# Patient Record
Sex: Female | Born: 1945 | Race: White | Hispanic: No | Smoking: Former smoker
Health system: Southern US, Community
[De-identification: ages and names within clinical notes are randomized; demographics above are authoritative.]

## PROBLEM LIST (undated history)

## (undated) DIAGNOSIS — J432 Centrilobular emphysema: Secondary | ICD-10-CM

## (undated) DIAGNOSIS — F32A Depression, unspecified: Secondary | ICD-10-CM

## (undated) DIAGNOSIS — M549 Dorsalgia, unspecified: Secondary | ICD-10-CM

## (undated) DIAGNOSIS — R0602 Shortness of breath: Secondary | ICD-10-CM

## (undated) DIAGNOSIS — N183 Chronic kidney disease, stage 3 unspecified: Secondary | ICD-10-CM

## (undated) DIAGNOSIS — S065X9A Traumatic subdural hemorrhage with loss of consciousness of unspecified duration, initial encounter: Secondary | ICD-10-CM

## (undated) DIAGNOSIS — I10 Essential (primary) hypertension: Secondary | ICD-10-CM

## (undated) DIAGNOSIS — C50919 Malignant neoplasm of unspecified site of unspecified female breast: Secondary | ICD-10-CM

## (undated) DIAGNOSIS — E785 Hyperlipidemia, unspecified: Secondary | ICD-10-CM

## (undated) DIAGNOSIS — K922 Gastrointestinal hemorrhage, unspecified: Secondary | ICD-10-CM

## (undated) DIAGNOSIS — G8929 Other chronic pain: Secondary | ICD-10-CM

## (undated) DIAGNOSIS — F329 Major depressive disorder, single episode, unspecified: Secondary | ICD-10-CM

## (undated) DIAGNOSIS — N179 Acute kidney failure, unspecified: Secondary | ICD-10-CM

## (undated) DIAGNOSIS — Z87891 Personal history of nicotine dependence: Secondary | ICD-10-CM

## (undated) DIAGNOSIS — I739 Peripheral vascular disease, unspecified: Secondary | ICD-10-CM

## (undated) DIAGNOSIS — S065XAA Traumatic subdural hemorrhage with loss of consciousness status unknown, initial encounter: Secondary | ICD-10-CM

## (undated) DIAGNOSIS — C189 Malignant neoplasm of colon, unspecified: Secondary | ICD-10-CM

## (undated) DIAGNOSIS — Z8679 Personal history of other diseases of the circulatory system: Secondary | ICD-10-CM

## (undated) HISTORY — PX: BACK SURGERY: SHX140

## (undated) HISTORY — DX: Personal history of other diseases of the circulatory system: Z86.79

## (undated) HISTORY — DX: Shortness of breath: R06.02

## (undated) HISTORY — PX: SHOULDER SURGERY: SHX246

## (undated) HISTORY — DX: Hyperlipidemia, unspecified: E78.5

## (undated) HISTORY — DX: Acute kidney failure, unspecified: N17.9

## (undated) HISTORY — DX: Malignant neoplasm of colon, unspecified: C18.9

## (undated) HISTORY — PX: PARTIAL HYSTERECTOMY: SHX80

## (undated) HISTORY — DX: Chronic kidney disease, stage 3 (moderate): N18.3

## (undated) HISTORY — PX: JOINT REPLACEMENT: SHX530

## (undated) HISTORY — PX: TUMOR REMOVAL: SHX12

## (undated) HISTORY — DX: Centrilobular emphysema: J43.2

## (undated) HISTORY — DX: Peripheral vascular disease, unspecified: I73.9

## (undated) HISTORY — PX: LUMBAR DISC SURGERY: SHX700

---
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
## 1998-06-14 ENCOUNTER — Encounter: Payer: Self-pay | Admitting: Neurosurgery

## 1998-06-14 ENCOUNTER — Ambulatory Visit (HOSPITAL_COMMUNITY): Admission: RE | Admit: 1998-06-14 | Discharge: 1998-06-14 | Payer: Self-pay | Admitting: Neurosurgery

## 1998-09-02 ENCOUNTER — Encounter: Payer: Self-pay | Admitting: Neurosurgery

## 1998-09-06 ENCOUNTER — Encounter: Payer: Self-pay | Admitting: Neurosurgery

## 1998-09-06 ENCOUNTER — Inpatient Hospital Stay (HOSPITAL_COMMUNITY): Admission: RE | Admit: 1998-09-06 | Discharge: 1998-09-12 | Payer: Self-pay | Admitting: Neurosurgery

## 1999-04-01 ENCOUNTER — Ambulatory Visit (HOSPITAL_COMMUNITY): Admission: RE | Admit: 1999-04-01 | Discharge: 1999-04-01 | Payer: Self-pay | Admitting: Neurosurgery

## 1999-04-01 ENCOUNTER — Encounter: Payer: Self-pay | Admitting: Neurosurgery

## 1999-08-22 ENCOUNTER — Encounter: Admission: RE | Admit: 1999-08-22 | Discharge: 1999-08-22 | Payer: Self-pay | Admitting: Neurosurgery

## 1999-08-22 ENCOUNTER — Encounter: Payer: Self-pay | Admitting: Neurosurgery

## 2002-07-29 ENCOUNTER — Encounter: Admission: RE | Admit: 2002-07-29 | Discharge: 2002-10-27 | Payer: Self-pay

## 2002-08-01 ENCOUNTER — Encounter: Payer: Self-pay | Admitting: Neurosurgery

## 2002-08-01 ENCOUNTER — Ambulatory Visit (HOSPITAL_COMMUNITY): Admission: RE | Admit: 2002-08-01 | Discharge: 2002-08-01 | Payer: Self-pay | Admitting: Neurosurgery

## 2002-10-16 ENCOUNTER — Ambulatory Visit (HOSPITAL_COMMUNITY): Admission: RE | Admit: 2002-10-16 | Discharge: 2002-10-16 | Payer: Self-pay | Admitting: Family Medicine

## 2002-10-16 ENCOUNTER — Encounter: Payer: Self-pay | Admitting: Family Medicine

## 2002-11-04 ENCOUNTER — Ambulatory Visit (HOSPITAL_COMMUNITY): Admission: RE | Admit: 2002-11-04 | Discharge: 2002-11-04 | Payer: Self-pay | Admitting: Family Medicine

## 2002-11-04 ENCOUNTER — Encounter: Payer: Self-pay | Admitting: Family Medicine

## 2002-12-25 ENCOUNTER — Ambulatory Visit (HOSPITAL_COMMUNITY): Admission: RE | Admit: 2002-12-25 | Discharge: 2002-12-25 | Payer: Self-pay | Admitting: Cardiology

## 2003-04-13 ENCOUNTER — Encounter
Admission: RE | Admit: 2003-04-13 | Discharge: 2003-07-12 | Payer: Self-pay | Admitting: Physical Medicine & Rehabilitation

## 2003-06-07 ENCOUNTER — Emergency Department (HOSPITAL_COMMUNITY): Admission: EM | Admit: 2003-06-07 | Discharge: 2003-06-07 | Payer: Self-pay | Admitting: Emergency Medicine

## 2003-12-23 ENCOUNTER — Ambulatory Visit (HOSPITAL_COMMUNITY): Admission: RE | Admit: 2003-12-23 | Discharge: 2003-12-23 | Payer: Self-pay | Admitting: Family Medicine

## 2004-02-06 ENCOUNTER — Emergency Department (HOSPITAL_COMMUNITY): Admission: EM | Admit: 2004-02-06 | Discharge: 2004-02-06 | Payer: Self-pay | Admitting: *Deleted

## 2005-02-08 ENCOUNTER — Ambulatory Visit (HOSPITAL_BASED_OUTPATIENT_CLINIC_OR_DEPARTMENT_OTHER): Admission: RE | Admit: 2005-02-08 | Discharge: 2005-02-08 | Payer: Self-pay | Admitting: Orthopedic Surgery

## 2005-02-08 ENCOUNTER — Ambulatory Visit (HOSPITAL_COMMUNITY): Admission: RE | Admit: 2005-02-08 | Discharge: 2005-02-08 | Payer: Self-pay | Admitting: Orthopedic Surgery

## 2005-02-20 ENCOUNTER — Encounter: Admission: RE | Admit: 2005-02-20 | Discharge: 2005-04-19 | Payer: Self-pay | Admitting: Orthopedic Surgery

## 2005-07-18 ENCOUNTER — Encounter: Admission: RE | Admit: 2005-07-18 | Discharge: 2005-07-18 | Payer: Self-pay | Admitting: Orthopedic Surgery

## 2006-02-28 ENCOUNTER — Ambulatory Visit (HOSPITAL_COMMUNITY): Admission: RE | Admit: 2006-02-28 | Discharge: 2006-02-28 | Payer: Self-pay | Admitting: Neurosurgery

## 2006-10-19 ENCOUNTER — Emergency Department (HOSPITAL_COMMUNITY): Admission: EM | Admit: 2006-10-19 | Discharge: 2006-10-19 | Payer: Self-pay | Admitting: Emergency Medicine

## 2006-11-01 ENCOUNTER — Inpatient Hospital Stay (HOSPITAL_COMMUNITY): Admission: RE | Admit: 2006-11-01 | Discharge: 2006-11-01 | Payer: Self-pay | Admitting: Neurosurgery

## 2007-04-30 ENCOUNTER — Ambulatory Visit: Payer: Self-pay | Admitting: Surgery

## 2007-04-30 ENCOUNTER — Encounter (INDEPENDENT_AMBULATORY_CARE_PROVIDER_SITE_OTHER): Payer: Self-pay | Admitting: Neurosurgery

## 2007-04-30 ENCOUNTER — Ambulatory Visit (HOSPITAL_COMMUNITY): Admission: RE | Admit: 2007-04-30 | Discharge: 2007-04-30 | Payer: Self-pay | Admitting: Neurosurgery

## 2007-04-30 ENCOUNTER — Encounter: Admission: RE | Admit: 2007-04-30 | Discharge: 2007-04-30 | Payer: Self-pay | Admitting: Neurosurgery

## 2007-11-29 ENCOUNTER — Encounter: Admission: RE | Admit: 2007-11-29 | Discharge: 2007-11-29 | Payer: Self-pay | Admitting: Neurosurgery

## 2008-01-09 ENCOUNTER — Encounter
Admission: RE | Admit: 2008-01-09 | Discharge: 2008-03-20 | Payer: Self-pay | Admitting: Physical Medicine & Rehabilitation

## 2008-01-10 ENCOUNTER — Ambulatory Visit: Payer: Self-pay | Admitting: Physical Medicine & Rehabilitation

## 2008-03-20 ENCOUNTER — Ambulatory Visit: Payer: Self-pay | Admitting: Physical Medicine & Rehabilitation

## 2008-04-09 ENCOUNTER — Ambulatory Visit (HOSPITAL_COMMUNITY): Admission: RE | Admit: 2008-04-09 | Discharge: 2008-04-12 | Payer: Self-pay | Admitting: Orthopedic Surgery

## 2008-04-14 ENCOUNTER — Ambulatory Visit: Payer: Self-pay | Admitting: Surgery

## 2008-04-14 ENCOUNTER — Ambulatory Visit: Admission: RE | Admit: 2008-04-14 | Discharge: 2008-04-14 | Payer: Self-pay | Admitting: Orthopedic Surgery

## 2008-04-14 ENCOUNTER — Encounter (INDEPENDENT_AMBULATORY_CARE_PROVIDER_SITE_OTHER): Payer: Self-pay | Admitting: Orthopedic Surgery

## 2008-05-27 ENCOUNTER — Encounter: Admission: RE | Admit: 2008-05-27 | Discharge: 2008-08-25 | Payer: Self-pay | Admitting: Orthopedic Surgery

## 2008-06-12 ENCOUNTER — Encounter
Admission: RE | Admit: 2008-06-12 | Discharge: 2008-09-10 | Payer: Self-pay | Admitting: Physical Medicine & Rehabilitation

## 2008-06-15 ENCOUNTER — Ambulatory Visit: Payer: Self-pay | Admitting: Physical Medicine & Rehabilitation

## 2008-07-10 ENCOUNTER — Encounter: Admission: RE | Admit: 2008-07-10 | Discharge: 2008-08-13 | Payer: Self-pay | Admitting: Anesthesiology

## 2008-07-14 ENCOUNTER — Ambulatory Visit: Payer: Self-pay | Admitting: Anesthesiology

## 2008-07-23 ENCOUNTER — Inpatient Hospital Stay (HOSPITAL_COMMUNITY): Admission: RE | Admit: 2008-07-23 | Discharge: 2008-07-27 | Payer: Self-pay | Admitting: Orthopedic Surgery

## 2008-08-25 ENCOUNTER — Encounter: Admission: RE | Admit: 2008-08-25 | Discharge: 2008-11-17 | Payer: Self-pay | Admitting: Orthopedic Surgery

## 2010-06-12 ENCOUNTER — Encounter: Payer: Self-pay | Admitting: Orthopedic Surgery

## 2010-09-01 LAB — COMPREHENSIVE METABOLIC PANEL
ALT: 13 U/L (ref 0–35)
Albumin: 3.9 g/dL (ref 3.5–5.2)
BUN: 20 mg/dL (ref 6–23)
Chloride: 100 mEq/L (ref 96–112)
Creatinine, Ser: 1.09 mg/dL (ref 0.4–1.2)
GFR calc non Af Amer: 51 mL/min — ABNORMAL LOW (ref >60)
Glucose, Bld: 102 mg/dL — ABNORMAL HIGH (ref 70–99)
Potassium: 3.6 mEq/L (ref 3.5–5.1)
Total Bilirubin: 0.8 mg/dL (ref 0.3–1.2)
Total Protein: 6.8 g/dL (ref 6.0–8.3)

## 2010-09-01 LAB — PROTIME-INR
INR: 1 (ref 0.00–1.49)
Prothrombin Time: 12.8 seconds (ref 11.6–15.2)

## 2010-09-01 LAB — URINALYSIS, ROUTINE W REFLEX MICROSCOPIC
Ketones, ur: NEGATIVE mg/dL
Leukocytes, UA: NEGATIVE
Nitrite: NEGATIVE
Protein, ur: NEGATIVE mg/dL
Specific Gravity, Urine: 1.009 (ref 1.005–1.030)
pH: 5.5 (ref 5.0–8.0)

## 2010-09-01 LAB — CBC
HCT: 41 % (ref 36.0–46.0)
Platelets: 247 10*3/uL (ref 150–400)

## 2010-09-01 LAB — URINE MICROSCOPIC-ADD ON

## 2010-09-07 ENCOUNTER — Other Ambulatory Visit: Payer: Self-pay

## 2010-09-08 ENCOUNTER — Other Ambulatory Visit: Payer: Self-pay | Admitting: Radiology

## 2010-09-08 DIAGNOSIS — C50911 Malignant neoplasm of unspecified site of right female breast: Secondary | ICD-10-CM

## 2010-09-09 DIAGNOSIS — C50919 Malignant neoplasm of unspecified site of unspecified female breast: Secondary | ICD-10-CM

## 2010-09-09 HISTORY — DX: Malignant neoplasm of unspecified site of unspecified female breast: C50.919

## 2010-09-10 ENCOUNTER — Ambulatory Visit
Admission: RE | Admit: 2010-09-10 | Discharge: 2010-09-10 | Disposition: A | Discharge status: Home / Self Care | Payer: 59 | Source: Ambulatory Visit | Attending: Radiology | Admitting: Radiology

## 2010-09-10 DIAGNOSIS — C50911 Malignant neoplasm of unspecified site of right female breast: Secondary | ICD-10-CM

## 2010-09-10 MED ORDER — GADOBENATE DIMEGLUMINE 529 MG/ML IV SOLN
5.0000 mL | Freq: Once | INTRAVENOUS | Status: AC | PRN
  Administered 2010-09-10: 5 mL via INTRAVENOUS

## 2010-09-12 ENCOUNTER — Other Ambulatory Visit: Payer: Self-pay

## 2010-09-14 ENCOUNTER — Other Ambulatory Visit: Payer: Self-pay | Admitting: Oncology

## 2010-09-14 ENCOUNTER — Encounter (HOSPITAL_BASED_OUTPATIENT_CLINIC_OR_DEPARTMENT_OTHER): Payer: 59 | Admitting: Oncology

## 2010-09-14 DIAGNOSIS — C50419 Malignant neoplasm of upper-outer quadrant of unspecified female breast: Secondary | ICD-10-CM

## 2010-09-14 LAB — CBC WITH DIFFERENTIAL/PLATELET
EOS%: 6.1 % (ref 0.0–7.0)
HCT: 33.7 % — ABNORMAL LOW (ref 34.8–46.6)
Platelets: 260 10*3/uL (ref 145–400)
RBC: 3.57 10*6/uL — ABNORMAL LOW (ref 3.70–5.45)

## 2010-09-14 LAB — COMPREHENSIVE METABOLIC PANEL
AST: 20 U/L (ref 0–37)
Alkaline Phosphatase: 59 U/L (ref 39–117)
BUN: 25 mg/dL — ABNORMAL HIGH (ref 6–23)
CO2: 29 mEq/L (ref 19–32)
Calcium: 9.5 mg/dL (ref 8.4–10.5)
Chloride: 106 mEq/L (ref 96–112)
Sodium: 141 mEq/L (ref 135–145)
Total Bilirubin: 0.3 mg/dL (ref 0.3–1.2)

## 2010-09-14 LAB — CANCER ANTIGEN 27.29: CA 27.29: 19 U/mL (ref 0–39)

## 2010-09-16 ENCOUNTER — Other Ambulatory Visit (HOSPITAL_COMMUNITY): Payer: Self-pay | Admitting: Surgery

## 2010-09-16 DIAGNOSIS — C50911 Malignant neoplasm of unspecified site of right female breast: Secondary | ICD-10-CM

## 2010-10-04 NOTE — Group Therapy Note (Signed)
REFERRAL:  Hosie Spangle, M.D.   PURPOSE OF EVALUATION:  Evaluate and treat for chronic low back pain.   HISTORY OF PRESENT ILLNESS:  [name] is a 65 year old Caucasian  female referred to this office by Dr. Sherwood Gambler, her neurosurgeon, for  evaluation and treatment of chronic diffuse lumbar pain.   Minimal medical records preceded the patient to this office and those  were reviewed prior to the office visit and then again with the patient  in the office today.   It appears that the patient has had low back pain since approximately  1998 at the time of her first surgery.  That was with Dr. Sherwood Gambler.  She  reports that she was prescribed OxyContin 20 mg b.i.d. at that time.  She reports that over the next 5-6 years OxyContin medication was  gradually increased to 40 mg q.12 h. along with immediate release  oxycodone on an as needed basis.  This was prescribed by Dr. Sherwood Gambler.   The patient reports a second surgery in the year 2000, which involved  stabilizing bars and screw placements, also performed by Dr. Sherwood Gambler.  The patient reports that despite the aggressive surgical treatment, she  has had persistent pain in her low back.  On February 26, 2007, the  patient underwent an MRI scan of her lumbar spine which showed  progression of facet disease at L1-2 with progressive biforaminal  stenosis.  There was significant increased prominence of disk bulge at  T11-T12.  She was status post posterior lumbar interbody fusion at L2-  L5.  There were prior diskectomies and fusion at L2-3 and L4-5, which  were stable.   The patient continued to be treated by Dr. Sherwood Gambler for her ongoing  pain.   On April 30, 2007, the patient underwent a followup MRI scan of her  lumbar spine which showed slightly increased diffuse disk protrusion at  T11-12 and L1-2 without focal nerve root impingement or cord  compression.  There was a new small broad-based disk protrusion at T12-  L1 without  impingement of significance.  There was stable fused levels  at L2-3 and L4-5 with no changes in facet joint disease at L5-S1.   The patient also reports that she underwent a CAT scan myelogram a few  months ago, but we do not have result of that study.   On May 10, 2007, the patient was seen by Dr. Sherwood Gambler and had  Doppler studies done to rule out deep vein thrombosis.  These were  negative.  Her OxyContin and oxycodone immediate release were refilled  at that time.   On November 08, 2007, the patient was seen by Dr. Sherwood Gambler and he noted  chronic narcotic use for 10 years since 1998.  She is on OxyContin CR at  40 mg b.i.d. and oxycodone immediate release 5 mg t.i.d. p.r.n.  She had  been on 20 mg as noted previously, but recent dose has been increased to  40 mg.  Dr. Sherwood Gambler did not feel comfortable with further adjustments  and referred her to this office for further pain management.  No  surgical intervention is planned at this time.   The patient reports that all pain medicines are not working as well  presently as they had been previously.  She reports taking OxyContin 40  mg and reports it relieved for only approximately 1-2 hours after the  dose.  She does take the oxycodone immediate release for breakthrough  pain and that generally is  about 3-4 per day.   The patient reports pain in her low back which she describes as aching  and occasionally burning.  She reports that she has received better  relief with her pain medicines in the past, but more recently has had  decreased relief.   PAST MEDICAL HISTORY:  1. Hypertension.  2. Chronic low back pain with lumbar surgery including cages in 1998      and lumbar surgery including stabilization bars and screws in 2000.  3. Dyslipidemia.  4. History of left carpal tunnel release, June 2008.   ALLERGIES:  No known drug allergies.   FAMILY HISTORY:  Positive for heart disease in her mother.  Her sister  has diabetes  mellitus, but she is unsure of any health problems related  to her father.   SOCIAL HISTORY:  The patient is single and lives alone.  She does not  work outside the home and is disabled.  She reports 1 alcoholic drink  per month and reports she smokes 1 pack of cigarettes per day.   MEDICATIONS:  1. Xanax 0.5 mg p.r.n.  2. BuSpar __________ daily.  3. Luvox 25 mg daily.  4. Avalide 300/12.5 mg 1 tablet daily.  5. Zocor 40 mg daily.  6. Lasix 20 mg daily.  7. Oxycodone immediate release 5 mg p.r.n.  8. OxyContin sustained release 40 mg q.12 h.  9. Premarin 0.3 mg daily.   REVIEW OF SYSTEMS:  Positive for weight gain, high blood sugar,  constipation, vomiting, nausea, and poor appetite.   PHYSICAL EXAMINATION:  GENERAL:  This is a well-appearing adult female  in mild-to-moderate acute discomfort.  VITAL SIGNS:  Blood pressure 122/64 with a pulse of 69, respiratory rate  18, and O2 saturation 96% on room air.  Height was 5 feet 4 inches and  weight 144 pounds.  MUSCULOSKELETAL:  Upper extremity range of motion was within normal  limits on the right with decreased range of motion of her left shoulder.  Strength was generally 4-/5 throughout the bilateral upper extremities,  especially proximally with 4+/5 strength distally.  Sensation was intact  to light touch throughout the bilateral upper extremities.  Lumbar range  of motion was decreased in extension and lateral bending.  She has a  well-healed lumbar scar along with a well-healed left shoulder scar.  The patient ambulates without any assistive device.  Straight leg raise  was negative to 30 degrees bilaterally.   IMPRESSION:  Post laminectomy syndrome/lumbar degenerative disk disease.   In the office today, we did discuss her pain medicines with her and  decided to increase her OxyContin to 60 mg q.12 h.  This will be an  increase from a total of 80 to 120 mg daily.  We also increased her  oxycodone to 15 mg tablet to be  used 1 tablet t.i.d. p.r.n. only on an  as needed basis.  She understands that all pain medicines need to come  through this office and that she needs to comply with all of our  regulations.  We did obtain a urine drug screen from the patient in the  office today.  She understands that all pain medicines need to come only  through this office and that she needs to call in for refills  approximately 5 business days before she runs out of the medication.  We  will  plan on seeing the patient in followup in approximately 2 months' time  to make adjustments as necessary at that  point.           ______________________________  Jarvis Morgan, M.D.     DC/MedQ  D:  01/13/2008 10:53:33  T:  01/14/2008 00:47:08  Job #:  [id number]

## 2010-10-04 NOTE — Op Note (Signed)
NAME:  [name], [name]             ACCOUNT NO.:  [id number]   MEDICAL RECORD NO.:  [id number]          PATIENT TYPE:  OIB   LOCATION:  5003                         FACILITY:  [facility]   PHYSICIAN:  Metta Clines. Supple, M.D.  DATE OF BIRTH:  [birth year]   DATE OF PROCEDURE:  07/23/2008  DATE OF DISCHARGE:                               OPERATIVE REPORT   PREOPERATIVE DIAGNOSIS:  End-stage right shoulder osteoarthrosis.   POSTOPERATIVE DIAGNOSIS:  End-stage right shoulder osteoarthrosis.   PROCEDURE:  Right total shoulder arthroplasty utilizing a size 12 Press-  Fit DePuy global stem and a cemented 40-mm glenoid with a 44 x 21  eccentric humeral head.   SURGEON:  Metta Clines. Supple, MD   ASSISTANT:  Reather Laurence. Shuford, PA-C   ANESTHESIA:  General endotracheal as well as a interscalene block.   ESTIMATED BLOOD LOSS:  300 mL.   DRAINS:  None.   HISTORY:  [name] is a 65 year old female who has had chronic pain  related to bilateral shoulder end-stage arthrosis as well as a chronic  degenerative lumbar condition.  She has recently undergone a left total  shoulder arthroplasty and has done well functionally.  She now presents  with severe right shoulder pain secondary to end-stage arthrosis and is  brought to the operating room at this time for planned right total  shoulder arthroplasty.   Preoperatively, I counseled [name] on treatment options as well as  risks versus benefits thereof.  Possible surgical complications,  bleeding, infection, neurovascular injury, persistent pain, loss of  motion, anesthetic complication and possible need for additional surgery  were reviewed.  She understands and accepts and agrees with our planned  procedure.   PROCEDURE IN DETAIL:  After undergoing routine preop evaluation, the  patient received prophylactic antibiotics and an interscalene block was  established in the holding area by the Anesthesia Department.  Placed  supine on the op table  and underwent smooth induction of a general  endotracheal anesthesia.  Placed into the beach-chair position and  appropriately padded and protected.  The right shoulder girdle region  was then sterilely prepped and draped in a standard fashion.  Time-out  was called.  On initial inspection, she does have a very prominent area  of swelling over the anterior aspect of the shoulder consistent with a  large subacromial fluid collection and this had been aspirated  preoperatively and was consistent with very viscous synovial fluid.  We  went ahead and outlined an incision from the coracoid process laterally  and distally total length approximately 15 cm centered over the  deltopectoral interval.  Sharp dissection carried down through the skin  and subcutaneous tissues and the deltopectoral interval was then  identified and the cephalic vein was retracted laterally to the deltoid  and protected.  Once this interval was developed, we did encounter a  very large glistening mass which was fluctuating and consistent with a  fluid collection within the subacromial and subdeltoid bursa.  This was  incised and a large amount of very viscous yellowish fluid was evacuated  consistent with ganglion cyst.  The conjoined  tendon was then identified  and retracted medially and the upper centimeter of the pectoralis major  was also tenotomized.  We then divided the subscapularis from its  insertion into the lesser tuberosity and split the rotator interval  towards the base of the coracoid and then more inferiorly divided the  anterior humeral circumflex vessels with electrocautery and then tagged  the free margin of the subscapularis with #2 FiberWire.  We then  externally rotated the arm and reflected the capsular tissues from the  inferior margin of the humeral head beyond the 6 o'clock position.  Large osteophyte from the inferior aspect of the head was then removed  with a rongeur.  We did find the rotator  cuff to be intact and we also  elevated the biceps tendon from its bicipital groove and tenotomized  this for later tenodesis.  The extramedullary guide was then used to  create an osteotomy of the humeral head with care taken to protect the  rotator cuff.  The head was then measured and was between 44 and 48  sizes on gross appearance incising.  We then went ahead and prepared the  humerus with the use of intramedullary reamers up to a size 12 and then  broaching up to size 12.  We implanted the trial size 12 implant.  It  had excellent fit.  We then turned our attention to the glenoid which  was exposed with a combination of retractors.  The periphery of the  glenoid was meticulously exposed with electrocautery moving all labral  capsular tissue attachments.  We then prepared the glenoid beginning  with a central starter hole and used the 40 reamer, just the 40 trial  had excellent and complete coverage of the glenoid.  The 40 reamer was  used to create a circumferential subchondral bony bed for the repair.  We then created the appropriate central and peripheral stabilizing holes  and a trial was placed showing excellent stability and fit.  We then  meticulously cleaned the glenoid.  Cement was mixed and we applied  cement into the peripheral peg holes and implanted the final peg glenoid  with excellent fit.  We then returned our attention to the humerus where  we performed trial reductions using the 40 mm sizes.  Even with a 44 x  21 eccentric head, there still appeared to be some moderately increased  posterior translation of the humeral head across the glenoid and we  attributed this to the fact that she had had such a severe tense fusion  for so long that the capsular structures had to be stretched out.  I did  feel however that repair of the subscapularis and anterior tissues would  help to correct this.  We went ahead and impacted the final 44 x 21  eccentric head, performed a  final reduction, took the shoulder through  motion and there was a suggestion of some increased glenohumeral  translation.  We went ahead and repaired the subscapularis through bone  tunnels and closed the rotator interval and at this point it became  clear that the shoulder had significantly increased posterior  translation and this was not to my satisfaction.  With this finding, we  went ahead and reopened the subscapularis and delivered the head through  the wound and removed the humeral head implant and then did confirm that  the posterior capsule was significantly redundant.  With this finding,  we performed a capsular plication posteriorly with a #2 FiberWire  creating a pursestring suture of the posterior capsular tissues which  allowed [name] to significantly reduce the redundant and excess posterior  capsular volume.  Once this was completed, we then performed again a  trial reduction at this point and there was appropriate posterior  translation for approximately 50% of the humeral head and the glenoid.  I was very pleased with this result.  I went ahead and cleaned the [facility]  taper of the humeral stem and impacted the eccentric 44 x 21 head  obtaining good purchase and fit.  Final reduction was performed.  The  shoulders taken through a range of motion.  At this point, we saw no  evidence for excessive posterior humeral translation or instability.  We  then went ahead and again repaired the subscapularis to the lesser  tuberosity through the previously placed bone tunnels and also repaired  the rotator interval with figure-of-eight #2 FiberWire sutures.  Then  performed a tenodesis of the biceps tendon in the bicipital groove.  She  had overall excellent soft tissue balance and good position of the  implant.  The wound was then copiously irrigated.  Hemostasis was  obtained.  The deltopectoral interval was allowed to reapproximate and a  tag suture of #2 FiberWire was placed at the  midportion of the  deltopectoral interval for marking purposes.  2-0 Vicryl  was used to close the subcutaneous layer and intracuticular 3-0 Monocryl  for the skin followed by Steri-Strips.  A dry dressing was then placed  over the right shoulder and the right arm placed on a sling immobilizer.  The patient laid supine, extubated, and taken to recovery room in stable  condition.      Metta Clines. Supple, M.D.  Electronically Signed     KMS/MEDQ  D:  07/23/2008  T:  07/24/2008  Job:  [id number]

## 2010-10-04 NOTE — Assessment & Plan Note (Signed)
[name] returns to clinic today for followup evaluation.  I last saw  her in this office on March 20, 2008.  We had been using OxyContin  sustained release and immediate release for her chronic left shoulder  pain.  She did have shoulder replacement on April 09, 2008, by Dr.  Onnie Graham and postoperatively, we were asked to manage her pain.  We  increased her OxyContin from 60 mg twice a day to 60 mg 3 times a day  and increased her immediate release oxycodone from 15 mg 3 times a day  to 4 times per day as needed.  She reports that she is getting fair  amount of relief with that medication.  She continues to do occupational  therapy for her left upper extremity.  She and Dr. Onnie Graham are planning  the eventual right total shoulder replacement over the next few months  once her left arm becomes more functional for her.  She does need  refills in the office today on the 2 medicines mentioned above.   The patient reports that her pain is interfering with her homemaking,  traveling, social life, sleeping, sitting, walking, and lifting.   MEDICATIONS:  1. Xanax 0.5 mg p.r.n.  2. BuSpar daily.  3. Levoxyl 25 mg daily.  4. Avalide 300/12.5 one tablet daily.  5. Zocor 40 mg daily.  6. Lasix 20 mg daily.  7. Oxycodone immediate release 15 mg q.i.d. p.r.n.  8. OxyContin sustained release 60 mg t.i.d.  9. Premarin 0.3 mg daily.   REVIEW OF SYSTEMS:  Positive for high blood sugar and constipation.   PHYSICAL EXAMINATION:  GENERAL:  A well-appearing, middle-aged adult  female, is in mild-to-moderate acute discomfort involving the bilateral  shoulders.  VITAL SIGNS:  Blood pressure is 119/69 with pulse of 68, respiratory  rate 18, and O2 saturation 96% on room air.  MUSCULOSKELETAL:  She has 4-/5 strength in the right upper extremity and  4/5 strength in the distal left upper extremity.  Range of motion was  decreased in bilateral shoulders.   IMPRESSION:  1. Post-laminectomy  syndrome/degenerative disk disease with history of      left shoulder total replacement on April 09, 2008.  2. Planned right total shoulder replacement with Dr. Onnie Graham.   In the office today we did refill the patient's OxyContin sustained  release and immediate release both as of June 16, 2008, at the dose  as noted above.  We will plan on seeing the patient in followup in this  office in approximately 1 month's time either with myself or with the  nursing staff.            ______________________________  Jarvis Morgan, M.D.     DC/MedQ  D:  06/15/2008 11:48:59  T:  06/16/2008 01:43:04  Job #:  [id number]

## 2010-10-04 NOTE — Op Note (Signed)
NAME:  [name]             ACCOUNT NO.:  [id number]   MEDICAL RECORD NO.:  [id number]          PATIENT TYPE:  OIB   LOCATION:  5019                         FACILITY:  [facility]   PHYSICIAN:  Metta Clines. Supple, M.D.  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   PROCEDURE:  Left total shoulder arthroplasty utilizing a size 10 Press-  Fit [employer] stem with a 48 x 18 head and a cemented 44-mm glenoid.   SURGEON:  Metta Clines. Supple, MD   ASSISTANT:  Reather Laurence. Shuford, PA-C   ANESTHESIA:  General endotracheal as well as a preop interscalene block.   ESTIMATED BLOOD LOSS:  300 mL.   DRAINS:  None.   HISTORY:  [name] is a 65 year old female who has had chronic  bilateral shoulder pain with known end-stage arthrosis in both  shoulders, the left being much more symptomatic than the right.  Due to  her ongoing pain and functional limitations, she is brought to the  operating room at this time for planned left total shoulder  arthroplasty.   Preoperatively, I counseled [name] on treatment options as well as  risks versus benefits thereof.  Possible surgical complication bleeding,  infection, neurovascular injury, persistence of pain, loss of motion,  failure of the implant, and possible need for revision surgery are  reviewed.  She understands and accepts and agrees with our planned  procedure.   PROCEDURE IN DETAIL:  After undergoing routine preop evaluation, the  patient received prophylactic antibiotics.  Interscalene block was  established in the holding area with the Anesthesia Department.  Placed  supine on the operative table and underwent smooth induction of general  endotracheal anesthesia.  Placed into a gentle beach-chair position  with  approximately 30 degrees of forward elevation of the torso.  Left  shoulder girdle region was then sterilely prepped and draped in standard  fashion.  Time-out was called.  An anterior deltopectoral incision was  then made approximately 12 cm in length along the deltopectoral interval  beginning proximally just above the coracoid process.  Skin flaps were  elevated, and electrocautery was used for hemostasis.  The cephalic vein  was identified, and the deltopectoral interval was then developed with  the vein had been retracted laterally with the deltoid.  The upper  centimeter of the pectoralis major was then divided to improve  visualization.  The conjoined tendon was then mobilized and retracted  medially.  The biceps tendon and bicipital groove was identified, and  this was then opened, and biceps tendon was then tenotomized.  The  anterior circumflex humeral vessels were electrocauterized, and then the  subscapularis was divided away from the lesser tuberosity with  electrocautery, mobilized and then the free margin was tagged with a  series of grasping #2 FiberWire sutures.  The subscapularis was then  circumferentially mobilized, and the anterior capsule was divided from  the glenoid and  confirmed that the subscapularis now was completely  mobile and free.  It was then retracted medially.  The humeral head was  then elevated to the wound.  Rotator cuff was carefully protected.  Using an extramedullary guide, we made the resection of the humeral  head, had approximately 30 degrees of retroversion, and the humeral head  was then measured, and showed the closest fit with a 48-mm raised  curvature.  We excavated all the bone from the head for later use during  implantation.  We then performed reaming up to size 10, which showed  significant canal purchase distally.  We then broached to size 10 and  maintain proper degree of retroversion of the implant.  At this point,  we removed  all the peripheral osteophytes from around the proximal  humerus, and there was a very large inferior osteophyte on the humeral  head, which was removed carefully with care taken to protect the  contents of the axillary pouch.  Once we confirmed, all osteophytes had  been appropriately removed from the humeral head.  We turned our  attention to the glenoid, which was exposed with humeral head and  pitchfork retractors and then we circumferentially excised the labrum as  well as the proximal biceps stump.  A central drill hole was then made  for stabilization of the reamer, and a size 44 glenoid trial was placed,  and this showed the most appropriate coverage of the glenoid.  We then  performed reaming with a size 44 reamer, and this showed excellent  coverage of the glenoid and achieved subchondral bony exposure  circumferentially.  We then placed the stabilizing drill holes beginning  centrally and then placing this peripheral drill holes, and then a trial  implant was placed, showed an excellent fit.  At this point, the glenoid  was then irrigated, and the holes were meticulously cleaned and dried.  Cement was mixed and it was injected into each of the peripheral drill  holes, impacted, and then the final implant was then impacted to the  position, showing excellent seating and coverage of the glenoid.  As the  cement hardened, we then confirmed proper positioning of the glenoid.  At this point, we returned our attention to the proximal humerus where  the final implant was opened.  It was introduced and then the entire  amount of bone that had been harvested from the humeral head was then  introduced around the stem and proximal fins of the implant, and the  implant was then impacted in position obtaining excellent interference  fit.  We then performed trial reductions and the 48 x 18 head had the  best coverage and soft tissue balance.  The final 48 x 18 head was then  opened, the  Morris taper was meticulously cleaned and dried, and a 48 x  18 head was then impacted to position.  Final reduction was performed.  The shoulder was taken through range of motion, showing excellent soft  tissue balance with easily 50% translation of the humeral head  posteriorly across the glenoid.  At this point, we then repaired the  subscapularis through bone tunnels to lesser tuberosity.  We then took  the shoulder through range of motion, showing easily 30 degrees of  external rotation without excessive tension on the subscapularis.  Final  irrigation was then performed.  The deltopectoral interval was then  allowed to close.  The subcutaneous layer was then closed with a tag  suture of 0.  FiberWire was placed in the midportion of the  deltopectoral interval.  The subcu layer was closed with 2-0 Vicryl, and  intracuticular 3-0 Monocryl was used to close the skin followed by Steri-  Strips.  Dry dressing was then taped over the left shoulder, and the  left arm was placed in a sling immobilizer.  The patient was then  awakened, extubated, and taken to the recovery room in stable condition.      Metta Clines. Supple, M.D.  Electronically Signed     KMS/MEDQ  D:  04/09/2008  T:  04/09/2008  Job:  [id number]

## 2010-10-04 NOTE — Op Note (Signed)
NAME:  [name], [name]             ACCOUNT NO.:  [id number]   MEDICAL RECORD NO.:  [id number]          PATIENT TYPE:  INP   LOCATION:  2899                         FACILITY:  [facility]   PHYSICIAN:  Hosie Spangle, M.D.DATE OF BIRTH:  Sep 28, 1945   DATE OF PROCEDURE:  11/01/2006  DATE OF DISCHARGE:  11/01/2006                               OPERATIVE REPORT   PREOPERATIVE DIAGNOSIS:  Left carpal tunnel syndrome.   POSTOPERATIVE DIAGNOSIS:  Left carpal tunnel syndrome.   PROCEDURE:  Left carpal tunnel release.   SURGEON:  Hosie Spangle, M.D.   ASSISTANT:  None.   ANESTHESIA:  Regional/Bier block.   INDICATIONS:  The patient is a 65 year old woman, who presented with  carpal tunnel syndrome.  EMG and nerve conduction studies confirmed  prolonged latency across the carpal tunnel for the median nerve, and  after discussing options for further treatment and care, the patient had  elected to proceed with a left carpal tunnel release.  She is status  post a previous right carpal tunnel release by another surgeon many  years ago.   DESCRIPTION OF PROCEDURE:  The patient was brought to the operating  room.  A Bier block was applied by the anesthesia service to the left  upper extremity, and then the left upper extremity was prepped with  Betadine soap and solution and draped in a sterile fashion.  An incision  was made in the proximal left palm, just medial to the thenar crease,  and carried proximally to the distal carpal crease.  Dissection was  carried down through the subcutaneous tissue to the transverse carpal  ligament.  We opened the transverse carpal ligament, carefully avoiding  the underlying median nerve.  It was opened fully in its full distal-to-  proximal extent.  There was a branch of the median nerve branching off  just at the distal aspect following the transverse carpal ligament, and  extending towards the left thumb, and this was left undisturbed.  Once  the  ligament was fully opened, hemostasis was established with the use  of bipolar cautery, and then we proceeded with closure.  The  subcutaneous layer was closed with interrupted, inverted 2-0 undyed  Vicryl sutures.  The skin edges were approximated with interrupted,  horizontal mattress 4-0 nylon suture.  And the wound was dressed with  Adaptic, gauze, fluffs, and wrapped with a Kling.  The procedure was  tolerated, and estimated blood loss was nil.  Sponge counts were  correct.  Following surgery, the patient is to be transferred to the  recovery room for further care.      Hosie Spangle, M.D.  Electronically Signed     RWN/MEDQ  D:  11/01/2006  T:  11/01/2006  Job:  [id number]

## 2010-10-04 NOTE — Discharge Summary (Signed)
NAME:  Bean, Holly             ACCOUNT NO.:  [id number]   MEDICAL RECORD NO.:  [id number]           PATIENT TYPE:   LOCATION:                                 FACILITY:   PHYSICIAN:  Metta Clines. Supple, M.D.  DATE OF BIRTH:  06-25-1945   DATE OF ADMISSION:  07/23/2008  DATE OF DISCHARGE:  07/27/2008                               DISCHARGE SUMMARY   ADMISSION DIAGNOSES:  1. End-stage osteoarthrosis of right shoulder.  2. Chronic pain syndrome.  3. Depression.  4. Anxiety.  5. Hypertension and history of chronic obstructive pulmonary disease      as a smoker.   DISCHARGE DIAGNOSES:  1. End-stage osteoarthrosis of right shoulder.  2. Chronic pain syndrome.  3. Depression.  4. Anxiety.  5. Hypertension and history of chronic obstructive pulmonary disease      as a smoker.  6. Status post right total shoulder arthroplasty.   OPERATIONS:  Right total shoulder arthroplasty, surgeon Metta Clines. Supple,  MD and assistant Reather Laurence. Shuford, PA-C, under a general anesthetic with  interscalene block.   [name] is a 65 year old female who has been followed for some time  for ongoing difficulties with bilateral shoulder.  She has known end-  stage osteoarthrosis of both shoulders and previously underwent a left  total shoulder arthroplasty.  She has healed well from that.  She now  continues to have severe pain of her right shoulder with recurrent  ganglions and significant pain secondary to her osteoarthrosis.  At this  time, she wished to undergo right total shoulder arthroplasty as  indicated.  Risks and benefits are discussed with the patient.  She  wished to proceed.   HOSPITAL COURSE:  The patient is admitted and underwent the above-named  procedure and tolerated this well.  All appropriate IV antibiotics and  analgesics were utilized.  Postoperatively, the patient had a very  difficult time with postoperative care secondary to pain control.  She  had been on chronic OxyContin  outpatient and OxyIR for a number of  months to years prior to these procedures.  This required additional IV  analgesics to keep her pain under better control.  She began working  with occupational therapy per total shoulder replacement protocol, 30,  60, 90 of passive motion and pendulums.  She remained afebrile.  Vital  signs stable and hemodynamically stable.  By date July 27, 2008, her  pain had transitioned better control on her p.o. analgesics.  At this  time she was felt orthopedically and medically stable for discharge to  home.  Home health OT was arranged prior to her discharge.   LABORATORY DATA:  It is not in the chart at the time of dictation.  EKG  was not available.   CONDITION ON DISCHARGE:  Stable, improved.   DISCHARGE MEDICATIONS AND PLAN:  The patient is being discharged to  home.  Home health OT is ordered.  She will resume her home medications.  Pain medications of OxyContin and Robaxin were provided as well as sleep  medications.  She will follow up in 2 weeks, call for the  time.  May  shower on day #5.  Resume regular diet.      Tracy A. Shuford, P.A.-C.      Metta Clines. Supple, M.D.  Electronically Signed    TAS/MEDQ  D:  09/03/2008  T:  09/04/2008  Job:  [id number]

## 2010-10-04 NOTE — Assessment & Plan Note (Signed)
[name] returns to the clinic today for followup evaluation.  I  first and last saw her in this office on January 10, 2008 on referral  from Dr. Sherwood Gambler for evaluation and treatment of chronic low back pain.  At that time, we have decided to increase her OxyContin to 60 mg q.12 h.  and increase her oxycodone to 15 mg 1 tablet t.i.d. p.r.n.  She has been  using those and getting much better relief.  Unfortunately, they have  not helped with her left shoulder pain.  She is due to have her left  shoulder replacement with Dr. Onnie Graham in April 09, 2008.  I have told  her that she needs to be in contact with our office regarding any new  medicines that Dr. Onnie Graham may put her on postoperatively.  Alternatively, she will need to contact this office for an adjustment of  her medicines if Dr. Onnie Graham is not willing to add the extra pain  medicines in the postoperative period.   MEDICATIONS:  1. Xanax 0.5 mg p.r.n.  2. BuSpar daily.  3. Luvox 25 mg daily.  4. Avalide 300/12.5 one tablet daily.  5. Zocor 40 mg daily.  6. Lasix 20 mg daily.  7. Oxycodone immediate release 15 mg t.i.d. p.r.n.  8. OxyContin sustained release 60 mg q.12 h.  9. Premarin 0.3 mg daily.   REVIEW OF SYSTEMS:  Positive for high blood sugar, weight gain, and  constipation.   PHYSICAL EXAMINATION:  GENERAL:  Well-appearing, middle-aged adult  female in mild acute discomfort involving her left shoulder.  She  reports that she has had excellent pain relief involving her back with  the adjustment of her pain medicines.  VITAL SIGNS:  Blood pressure is 142/77 with pulse of 65, respiratory  rate 18, and O2 saturation 94% on room air.  EXTREMITIES:  She has 4-/5 strength in the right upper and left upper  extremities.  Bulk and tone were normal.  Lumbar range of motion was  slightly decreased in all planes.   IMPRESSION:  1. Post-laminectomy syndrome/degenerative disk disease.  2. History of left shoulder pain with  planned left shoulder      replacement on April 09, 2008 by Dr. Onnie Graham.   In the office today, the patient understands that she will be in touch  with [name] regarding any postoperative pain medicines that she needs  if  Dr. Onnie Graham is not willing to add medicines.  She has had a recent refill  on her OxyContin and oxycodone, which are both brand names for this  patient.  We will plan on seeing her in followup in approximately 3  month's time, but expect a phone call from her if her postoperative pain  is not controlled.           ______________________________  Jarvis Morgan, M.D.     DC/MedQ  D:  03/20/2008 11:50:58  T:  03/21/2008 00:37:59  Job #:  [id number]

## 2010-10-04 NOTE — Assessment & Plan Note (Signed)
[name].  I  evaluated her via health and history form 14-point review of systems,  review of the chart, progress __________ overall directed care approach.   1. Reviewed her medications and it strikes to me that she is taking a      number of pills.  I spent a considerable period of time reviewing      that with her.  She is looking forward to right shoulder surgery      and she does have legitimate pain and need for medications.  I also      relayed to her that OxyContin is a b.i.d. drug, not a t.i.d. drug,      and I will not write that drug for t.i.d.  I also do not like so      many breakthrough meds because this appears to me the pattern is      she is constantly taking pills.  It makes little sense to keep      going up on the breakthrough med when we could make      pharmacokinetically long-acting agents such as OxyContin controlled-      release.  We can adjust the dosing on that agent.  2. She requests brand name only.  This is always of concern.  The      point is a bit moot with OxyContin as there is no generic, but I am      not going to do brand name.  They are pharmacokinetically and      pharmacologically identical, and I do not see any reason to add      that layer of risk.  3. Reviewed the opioid consent, the patient's care agreement, full      informed consent, and UDS.  4. Recommend cigarette cessation for best outcome.   OBJECTIVE:  She can barely move her shoulder, right side and left side  is convalesce nicely from replacement.  She has suprascapular __________  pain, pain in the paralumbar with flexion, extension, and side bending.   IMPRESSION:  Osteoarthritis to his shoulder, anticipating total shoulder  replacement on July 23, 2008.   PLAN:  I am going to switch her to OxyContin 80 mg b.i.d. from 60 mg  t.i.d. and switch her from Roxicodone 4 times a day to 2 times day and  follow expectantly.  I think  this is going to be inadequate adjustment  for her, but she will let us know if there is a problem, and I think  this will probably actually smooth her up.  I will also go over the  concept of tolerance and resistance to opioids in the higher dose, and  discuss other treatment limitations and options and risks.  If both in  agreement, I will probably able to start decreasing the narcotic load  after she has her surgery, which will be in her best interest as well.   I am not sure if she is completely happy with the change, but I do think  that she will find it satisfactory and she has an opportunity to talk to  [name] at any time.   Discharge instructions given.  No barrier to communication with her in  followup.           ______________________________  Rosann Auerbach, MD     HH/MedQ  D:  07/14/2008 17:29:51  T:  07/15/2008 [id number]  Job #:  [id number]

## 2010-10-07 ENCOUNTER — Other Ambulatory Visit (INDEPENDENT_AMBULATORY_CARE_PROVIDER_SITE_OTHER): Payer: Self-pay | Admitting: Surgery

## 2010-10-07 ENCOUNTER — Ambulatory Visit
Admission: RE | Admit: 2010-10-07 | Discharge: 2010-10-07 | Disposition: A | Discharge status: Home / Self Care | Payer: PRIVATE HEALTH INSURANCE | Source: Ambulatory Visit | Attending: Surgery | Admitting: Surgery

## 2010-10-07 ENCOUNTER — Encounter (HOSPITAL_BASED_OUTPATIENT_CLINIC_OR_DEPARTMENT_OTHER)
Admission: RE | Admit: 2010-10-07 | Discharge: 2010-10-07 | Disposition: A | Discharge status: Home / Self Care | Payer: PRIVATE HEALTH INSURANCE | Source: Ambulatory Visit | Attending: Surgery | Admitting: Surgery

## 2010-10-07 DIAGNOSIS — Z01811 Encounter for preprocedural respiratory examination: Secondary | ICD-10-CM

## 2010-10-07 LAB — COMPREHENSIVE METABOLIC PANEL
ALT: 15 U/L (ref 0–35)
AST: 17 U/L (ref 0–37)
Albumin: 3.7 g/dL (ref 3.5–5.2)
BUN: 27 mg/dL — ABNORMAL HIGH (ref 6–23)
Calcium: 10.3 mg/dL (ref 8.4–10.5)
Chloride: 100 mEq/L (ref 96–112)
Creatinine, Ser: 1.66 mg/dL — ABNORMAL HIGH (ref 0.4–1.2)
Glucose, Bld: 112 mg/dL — ABNORMAL HIGH (ref 70–99)
Potassium: 5.3 mEq/L — ABNORMAL HIGH (ref 3.5–5.1)
Sodium: 138 mEq/L (ref 135–145)

## 2010-10-07 LAB — DIFFERENTIAL
Eosinophils Relative: 4 % (ref 0–5)
Monocytes Relative: 10 % (ref 3–12)
Neutro Abs: 3.9 10*3/uL (ref 1.7–7.7)
Neutrophils Relative %: 55 % (ref 43–77)

## 2010-10-07 LAB — CBC
MCHC: 33 g/dL (ref 30.0–36.0)
Platelets: 244 10*3/uL (ref 150–400)
RBC: 3.69 MIL/uL — ABNORMAL LOW (ref 3.87–5.11)
RDW: 12.4 % (ref 11.5–15.5)

## 2010-10-07 NOTE — Op Note (Signed)
NAME:  [name], [name]             ACCOUNT NO.:  [id number]   MEDICAL RECORD NO.:  [id number]          PATIENT TYPE:  AMB   LOCATION:  [place]                          FACILITY:  [facility]   PHYSICIAN:  Estill Bamberg. Ronnie Derby, M.D. DATE OF BIRTH:  [birth year]   DATE OF PROCEDURE:  02/08/2005  DATE OF DISCHARGE:                                 OPERATIVE REPORT   PREOPERATIVE DIAGNOSIS:  Left shoulder impingement syndrome, osteoarthritis  of the glenohumeral and acromioclavicular joint.   POSTOPERATIVE DIAGNOSIS:  Left shoulder impingement syndrome, osteoarthritis  of the glenohumeral and acromioclavicular joint.   OPERATION PERFORMED:  Left shoulder arthroscopy, subacromial decompression,  glenohumeral debridement, distal clavicle resection.   SURGEON:  Estill Bamberg. Ronnie Derby, M.D.   ASSISTANT:  None.   ANESTHESIA:  General.   INDICATIONS FOR PROCEDURE:  The patient is a 65 year old white female with  failure of conservative measures for shoulder impingement and an MRI showing  loose bodies in the glenohumeral joint, arthritic acromioclavicular joint  and rotator cuff tendinosus.  Informed consent was obtained.   DESCRIPTION OF PROCEDURE:  The patient was taken to the operating room and  administered general anesthesia.  The left shoulder was prepped and draped  in the usual sterile fashion.  At this point the anterior, posterior and  direct lateral portals were created with a #11 blade, blunt trocar and  cannula.  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  There was fraying of the  labrum as well as some loose bodies.  Debridement was performed in the joint  with the small [place] shaver as well as the ArthroCare debridement  wand.  Once the debridement was completed, I went into the subacromial space  from the posterior portal.  There was a very large anterolateral acromial  spur as well as acromioclavicular joint  inferior spurring creating  impingement syndrome.  The rotator cuff did have some fraying but no full  thickness tears.  The bursa was debrided.  I then released the CA ligament  with the ArthroCare debridement wand.  I then completed an anterolateral  acromioplasty.  This was very aggressive.  I created a lot of decompression  and then removed the distal clavicle through the direct lateral portal and  anterior portal sequentially.  I then irrigated and closed with interrupted  4-0 nylon sutures, infiltrated 20 mL of a Marcaine morphine mixture split  between the three portals, closed with 4-0 nylon sutures, dressed with  Xeroform dressing sponges, ABDs and a Neer shoulder dressing.   COMPLICATIONS:  None.   DRAINS:  None.   ESTIMATED BLOOD LOSS:  Minimal.           ______________________________  Estill Bamberg. Ronnie Derby, M.D.     SDL/MEDQ  D:  02/08/2005  T:  02/08/2005  Job:  [id number]

## 2010-10-07 NOTE — H&P (Signed)
NAME:  [name], [name] L.                      ACCOUNT NO.:  [id number]   MEDICAL RECORD NO.:  [id number]                   PATIENT TYPE:  OIB   LOCATION:                                       FACILITY:  [facility]   PHYSICIAN:  Peter M. Martinique, M.D.               DATE OF BIRTH:  08-30-1945   DATE OF ADMISSION:  12/25/2002  DATE OF DISCHARGE:                                HISTORY & PHYSICAL   HISTORY OF PRESENT ILLNESS:  Mrs. [name] is a 65 year old white female who  is seen for evaluation of chest pain.  She states that for the past four to  six months she is having symtpoms of pain in her left anterior chest  radiating to her left shoulder and down her left arm.  This is associated  with numbness and tingling in her left hand and a sensation of pins and  needles.  Her symptoms are not related to activity or movement.  May last up  to one to two hours but sometimes last only a few minutes.  Has both sharp  and dull components.  She had also complained of dizziness but this was  improving.  She subsequently underwent adenosine Cardiolite study on December 18, 2002.  This demonstrated evidence of distal anterior wall ischemia with  normal left ventricular function.  She is now admitted for cardiac  catheterization.   PAST MEDICAL HISTORY:  1. Hypertension.  2. Hypercholesterolemia.  3. Panic attacks.  4. Status post back surgery x2 with chronic back pain.  5. Carpal tunnel surgery on the right.  6. Status post vein stripping on the left leg.   ALLERGIES:  No known drug allergies.   CURRENT MEDICATIONS:  1. OxyContin twice a day.  2. Oxycodone p.r.n.  3. Premarin daily.  4. Pravachol daily.  5. ___________ 300/12.5 mg daily.   SOCIAL HISTORY:  The patient is disabled for a back injury.  She smokes one  pack per day and has for 42 years.  She rarely drinks alcohol. She drinks 6-  10 cups of coffee per day.   FAMILY HISTORY:  Father died of unknown causes.  Mother is alive at age  65.  She has history of coronary disease and hypertension.  Four siblings are  alive and well.   REVIEW OF SYSTEMS:  Otherwise negative.   REVIEW OF SYSTEMS:  The patient denies any increased, edema, orthopnea.  She  has had no tachy palpitations.  She denies any true chest pain.  No history  of CDA.  Other review of systems are negative.   PHYSICAL EXAMINATION:  GENERAL:  The patient is a pleasant white female in  no distress.  VITAL SIGNS:  Weight 155,  blood pressure 118/82, pulse 76 and regular.  HEENT:  Pupils are equal, round and reactive to light and accommodation.  Extraocular movements are full.  Oropharynx clear.  NECK:  Without JVD,  adenopathy, thyromegaly or bruits.  LUNGS:  Clear to auscultation and percussion.  CARDIAC:  Regular rate and rhythm without murmurs, rubs or clicks.  ABDOMEN:  Soft, nontender without masses or hepatosplenomegaly.  PULSES:  Femoral and pedal pulses are 2+ and symmetric.  EXTREMITIES:  There is no edema.  NEUROLOGIC:  Nonfocal.   LABORATORY DATA:  Electrocardiogram shows normal sinus rhythm, normal ECG.  CBC and chemistry profile were normal.  TSH normal.   IMPRESSION:  1. Chest pain with both typical and atypical features.  Abnormal adenosine     Cardiolite studies suggesting distal anterior ischemia.  2. Hypertension.  3. Hypercholesterolemia.  4. Tobacco abuse.  5. Chronic back injury.  6. History of panic disorder.   PLAN:  Admit for cardiac catheterization with further therapy pending these  results.                                                 Peter M. Martinique, M.D.    PMJ/MEDQ  D:  12/22/2002  T:  12/22/2002  Job:  [id number]   cc:   Barton Fanny, M.D.  [address] [place]  [place] [address]  Fax: [phone]

## 2010-10-07 NOTE — Consult Note (Signed)
NAME:  Bean, Holly L                       ACCOUNT NO.:  [id number]   MEDICAL RECORD NO.:  [id number]                   PATIENT TYPE:  REC   LOCATION:  [facility]                                  FACILITY:   PHYSICIAN:  Arlina Robes, DO                      DATE OF BIRTH:  January 06, 1946   DATE OF CONSULTATION:  07/30/2002  DATE OF DISCHARGE:                                   CONSULTATION   REFERRING PHYSICIAN:  Tanya D. Hassell Done, M.D.  Dear Dr. Hassell Done:  Thank you very much for kindly referring [name] to [facility] for evaluation.  [name] was seen  in our clinic today.  Please refer to the following for details regarding  the history and physical examination and treatment plan.  Once again, thank  you for allowing [name] to participate in the care of [name].   CHIEF COMPLAINT:  To relieve pain in lower back and right leg, sometimes  left leg.   HISTORY OF PRESENT ILLNESS:  [name] is a pleasant 65 year old, right-  hand dominant female with a five to six year history of low back pain which  started after a work-related injury in March 1998 when she slipped at work  injuring her lower back.  Since that time, she has undergone two lumbar  surgeries, one in 1998 which she states was a ray-cage fusion but cannot  remember the level or levels.  This was performed by Dr. Sherwood Gambler.  She  states that her symptoms really did not improve and she underwent pedicle  screw fixation of the lumbar spine in 2000 per Dr. Sherwood Gambler, but again  cannot remember the level or levels and I do not have any records in this  regard.  Currently she complains of pain mainly in her right buttock over the past  few months which radiates into her right posterolateral thigh and anterior  knee.  She also complains of some pain radiating into her left buttock but  is not as bad as the right side.  She has followed up with Dr. Sherwood Gambler  recently and is  scheduled to have an MRI of her lumbar spine on August 05, 2002.  She has been treated since 1998 with OxyContin and oxycodone for  breakthrough pain per Dr. Sherwood Gambler.  She just received a new prescription  for OxyContin 40 mg b.i.d. and oxycodone on July 28, 2002.  Her pain today  is an 8/10 on a subjective scale and described as constant, throbbing, sharp  and worse with walking, bending, sitting and improved with rest and  medications to some degree.  She has tried other medications including  Elavil which she states made her comatose.  She has also tried multiple  nonsteroidal anti-inflammatory medications but is not specific with any  names.  Her function and quality of life indices have  declined.  Her sleep is poor.  She has been throughout physical therapy on several different occasions and  her exercise program currently includes walking and leg lifts.  She has also  had some type of cortisone shots in the past which offered no relief.  Other providers include Dr. Marvel Plan, psychiatrist.  She states she has  history of panic attacks.  She denies any bowel or bladder dysfunction,  fevers, chills, night sweats or weight loss.  I reviewed health and history form and 14-point review of systems. She  admits to excessive worry, depression, nervous disorder, memory loss,  numbness, weakness, night leg cramps, back pain, leg pain with walking.   PAST MEDICAL HISTORY:  1. Hypertension.  2. Hypercholesterolemia.  3. Panic attacks.   PAST SURGICAL HISTORY:  Lumbar surgery x 2 to include ray-cage and pedicle  screw fixation.   FAMILY HISTORY:  Cancer, diabetes, hypertension, heart failure, heart  attacks.   SOCIAL HISTORY:  The patient smokes one pack of cigarettes per day and I  counseled her on the importance of smoking cessation in terms of overall  health and pain.  She admits to occasional alcohol use.  She denies illicit  drug use or history of alcohol or drug abuse.  She is  divorced and not  currently working.  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   ALLERGIES:  No known drug allergies.   MEDICATIONS:  1. OxyContin 40 mg b.i.d.  2. Oxycodone 5 mg as needed for breakthrough pain.  3. Pravachol.  4. Premarin.  5. Antihypertensive.  6. Luvox which she has not taken for several weeks.   PHYSICAL EXAMINATION:  GENERAL:  Healthy-appearing female in no acute  distress.  The patient is alert and oriented.  Mood and affect are  appropriate.  VITAL SIGNS:  Blood pressure 112/76, pulse 72, respirations 16, O2  saturation 97% on room air.  NEUROMUSCULAR:  Examination of the back reveals a level pelvis without  scoliosis.  There is normal lumbar lordosis.  There is a large vertical  midline incisional scar of the lumbar spine which is well healed.  Palpatory  examination reveals significantly tight paraspinous muscles of the lumbar  spine with mild tenderness to palpation.  Range of motion of the lumbar  spine reveals minimal extension, full flexion with pulling sensation of the  lumbar paraspinal muscles.  Rotation and side bending are somewhat limited  as well.  Manual muscle testing is 5/5 bilateral lower extremities.  Sensory  examination is intact to light touch bilateral lower extremities.  Muscle  stretch reflexes are 2+/4 bilateral patella, medial hamstrings and left  Achilles and 1+/4 right Achilles.  Toes are downgoing bilaterally.  No ankle  clonus noted bilaterally.  No abnormal tone noted in the lower extremities.  Straight leg raise is equivocal on the right, negative on the left.  Corky Sox  is negative bilaterally.  The patient has tight hamstrings and hip flexors  bilaterally.  There is no heat, erythema or edema of the lower extremities.   IMPRESSION:  1. Chronic low back pain with right lower extremity radicular symptoms in an    L5 distribution although the patient does have a  diminished right ankle     jerk reflex which may be chronic in nature.  She is status post lumbar     surgery x 2 to include ray-cage fusion as well as pedicle screw fixation.   PLAN:  1. Discussed treatment options with [name].  Initially I want to     obtain records from Dr. Sherwood Gambler to include operative report as well as     clinic notes.  I would also like to obtain a copy of the upcoming MRI on     August 05, 2002.  2. I recommend starting aquatic therapy for range of motion, stretching,     stabilization exercises and low-to-nonimpact aerobic exercise program     leading to an independent program.  The patient has tightness in her     lumbar paraspinouses as well as lower extremities which I think     improvement in flexibility will help her situation.  I do not feel that     waiting for the MRI to begin physical therapy is necessary.  3. Await MRI of the lumbar spine.  4. Consider adding Lidoderm patches.  5. Consider adding Neurontin or another anticonvulsant drug to help with     radicular symptoms.  6. Continue current medications to include OxyContin and oxycodone per Dr.     Sherwood Gambler.  7. Instructed the patient to follow up with her primary care physician.     [name] understands that I will be leaving this clinic as of April     15.  I have given her the option of following up here as needed or to     follow with the physicians who will take my place after April 15.  Mrs.     [name] wants to follow up with Dr. Sherwood Gambler first     after her MRI before any further medications or minimally invasive     procedures to help her pain symptoms.  The patient was educated on the above findings and recommendations and  understands.  There were no barriers to communication.                                                Arlina Robes, DO    JW/MEDQ  D:  07/30/2002  T:  07/31/2002  Job:  [id number]   cc:   Tanya D. Hassell Done, M.D.  [phone] [address]  [place]  [address]  Fax: [phone]   Hosie Spangle, M.D.  [place]  [place] [address]  Fax: [phone]

## 2010-10-10 ENCOUNTER — Ambulatory Visit (HOSPITAL_BASED_OUTPATIENT_CLINIC_OR_DEPARTMENT_OTHER)
Admission: RE | Admit: 2010-10-10 | Discharge: 2010-10-10 | Disposition: A | Discharge status: Home / Self Care | Payer: PRIVATE HEALTH INSURANCE | Source: Ambulatory Visit | Attending: Surgery | Admitting: Surgery

## 2010-10-10 ENCOUNTER — Other Ambulatory Visit (INDEPENDENT_AMBULATORY_CARE_PROVIDER_SITE_OTHER): Payer: Self-pay | Admitting: Surgery

## 2010-10-10 ENCOUNTER — Ambulatory Visit (HOSPITAL_COMMUNITY)
Admission: RE | Admit: 2010-10-10 | Discharge: 2010-10-10 | Disposition: A | Discharge status: Home / Self Care | Payer: PRIVATE HEALTH INSURANCE | Source: Ambulatory Visit | Attending: Surgery | Admitting: Surgery

## 2010-10-10 DIAGNOSIS — J449 Chronic obstructive pulmonary disease, unspecified: Secondary | ICD-10-CM | POA: Insufficient documentation

## 2010-10-10 DIAGNOSIS — C50911 Malignant neoplasm of unspecified site of right female breast: Secondary | ICD-10-CM

## 2010-10-10 DIAGNOSIS — C50919 Malignant neoplasm of unspecified site of unspecified female breast: Secondary | ICD-10-CM | POA: Insufficient documentation

## 2010-10-10 DIAGNOSIS — J4489 Other specified chronic obstructive pulmonary disease: Secondary | ICD-10-CM | POA: Insufficient documentation

## 2010-10-10 DIAGNOSIS — F411 Generalized anxiety disorder: Secondary | ICD-10-CM | POA: Insufficient documentation

## 2010-10-10 DIAGNOSIS — G8929 Other chronic pain: Secondary | ICD-10-CM | POA: Insufficient documentation

## 2010-10-10 DIAGNOSIS — Z01812 Encounter for preprocedural laboratory examination: Secondary | ICD-10-CM | POA: Insufficient documentation

## 2010-10-10 DIAGNOSIS — I10 Essential (primary) hypertension: Secondary | ICD-10-CM | POA: Insufficient documentation

## 2010-10-10 DIAGNOSIS — Z01818 Encounter for other preprocedural examination: Secondary | ICD-10-CM | POA: Insufficient documentation

## 2010-10-10 HISTORY — PX: BREAST LUMPECTOMY: SHX2

## 2010-10-10 MED ORDER — TECHNETIUM TC 99M SULFUR COLLOID FILTERED
1.0000 | Freq: Once | INTRAVENOUS | Status: AC | PRN
  Administered 2010-10-10: 1 via INTRADERMAL

## 2010-10-11 NOTE — Op Note (Addendum)
  NAME:  [name], [name]             ACCOUNT NO.:  [id number]  MEDICAL RECORD NO.:  [id number]  LOCATION:  [facility]                          FACILITY:  [facility]  PHYSICIAN:  Marcello Moores A. Kable Haywood, M.D.DATE OF BIRTH:  1946-04-03  DATE OF PROCEDURE:  10/10/2010 DATE OF DISCHARGE:                              OPERATIVE REPORT   PREOPERATIVE DIAGNOSIS: Right Breast Cancer POST OP DIAGNOSIS:  same PROCEDURE:  Right breast needle localized partial mastectomy             Right axillary sentinal lynph node mapping with injection of methylene blue dye  SURGEON:  Timur Nibert A. Calbert Hulsebus, MD.  ANESTHESIA:  LMA with 0.25 % Sensorcaine local with epinephrine.  ESTIMATED BLOOD LOSS:  Minimal.  SPECIMEN: 1. Right breast cancer. 2. Superior margin right breast lumpectomy cavity. 3. Two right axillary sentinel lymph nodes, cotton blue pathology.  DRAINS:  None.  INDICATIONS FOR PROCEDURE:  The patient is a 70-year female with a 1.6- cm right breast cancer.  Options of breast conserving surgery and mastectomy were discussed.  She agrees to proceed with breast conserving surgery.  She will also get postoperative radiation therapy and chemotherapy.  Risks, benefits, and alternatives of therapies were discussed with her in the office.  She agreed to proceed.  DESCRIPTION OF PROCEDURE:  The patient was placed in an holding area. Nuclear Medicine injected technetium sulfur colloid in the right breast for mapping.  She was then taken back to the operating room.  After induction of general anesthesia, the right breast was prepped with alcohol and 4 mL of methylene blue dye admixed with normal saline were injected in the subareolar position and massaged for 5 minutes.  Then, the right breast was prepped and draped in sterile fashion afterwards. NeoProbe was used.  Hot spot was identified in the right axilla. Incision was made in right axilla and two hot nodes were identified and these level I node were sent to  pathology.  Background counts approached to zero.  Hemostasis was achieved.  Next, lumpectomy was done.  Curvilinear incision was made in the right lateral breast.  Wire was brought to the incision.  All tissue and the tip of the wire was excised and then sent for x-ray which showed the mass to be within specimen with wire localized needle wire in place as well as clip.  I took an additional superior margin since this looked close to me.  Cavities irrigated, found be hemostatic.  Surgicel was placed in lumpectomy cavity, was closed with combination of 3-0 Monocryl.  The right sentinel lymph node harvest site was examined, found to be hemostatic.  Surgicel was placed. It was closed to the deep layer of 3-0 Vicryl and  subsequent 4-0 Monocryl stitch.  Dermabond was applied.  All final counts of sponge, needle and instrument found to be correct at this portion of the case.  The patient was awoken, taken to recovery in satisfactory condition.     Luda Charbonneau A. Harveen Flesch, M.D.   ______________________________ Joyice Faster. Grier Vu, M.D.    TAC/MEDQ  D:  10/10/2010  T:  10/10/2010  Job:  [id number]  Electronically Signed by Erroll Luna M.D. on 11/26/2010 05:13:52 PM

## 2010-10-31 ENCOUNTER — Encounter (HOSPITAL_BASED_OUTPATIENT_CLINIC_OR_DEPARTMENT_OTHER): Payer: PRIVATE HEALTH INSURANCE | Admitting: Oncology

## 2010-10-31 ENCOUNTER — Other Ambulatory Visit: Payer: Self-pay | Admitting: Oncology

## 2010-10-31 DIAGNOSIS — C50419 Malignant neoplasm of upper-outer quadrant of unspecified female breast: Secondary | ICD-10-CM

## 2010-10-31 LAB — CBC WITH DIFFERENTIAL/PLATELET
BASO%: 0.4 % (ref 0.0–2.0)
Basophils Absolute: 0 10*3/uL (ref 0.0–0.1)
EOS%: 3.2 % (ref 0.0–7.0)
HCT: 36.3 % (ref 34.8–46.6)
HGB: 12.3 g/dL (ref 11.6–15.9)
MCH: 31.5 pg (ref 25.1–34.0)
MCHC: 33.9 g/dL (ref 31.5–36.0)
MCV: 92.8 fL (ref 79.5–101.0)
MONO%: 10.7 % (ref 0.0–14.0)
NEUT%: 57.8 % (ref 38.4–76.8)

## 2010-10-31 LAB — COMPREHENSIVE METABOLIC PANEL
AST: 17 U/L (ref 0–37)
Alkaline Phosphatase: 72 U/L (ref 39–117)
BUN: 39 mg/dL — ABNORMAL HIGH (ref 6–23)
Creatinine, Ser: 2 mg/dL — ABNORMAL HIGH (ref 0.50–1.10)
Total Bilirubin: 0.3 mg/dL (ref 0.3–1.2)

## 2010-12-05 ENCOUNTER — Other Ambulatory Visit: Payer: Self-pay | Admitting: Oncology

## 2010-12-05 ENCOUNTER — Encounter (HOSPITAL_BASED_OUTPATIENT_CLINIC_OR_DEPARTMENT_OTHER): Payer: PRIVATE HEALTH INSURANCE | Admitting: Oncology

## 2010-12-05 DIAGNOSIS — C50419 Malignant neoplasm of upper-outer quadrant of unspecified female breast: Secondary | ICD-10-CM

## 2010-12-05 LAB — CBC WITH DIFFERENTIAL/PLATELET
Basophils Absolute: 0 10*3/uL (ref 0.0–0.1)
EOS%: 1.1 % (ref 0.0–7.0)
HCT: 37.8 % (ref 34.8–46.6)
HGB: 12.5 g/dL (ref 11.6–15.9)
LYMPH%: 28 % (ref 14.0–49.7)
MCH: 29.6 pg (ref 25.1–34.0)
MCV: 89.4 fL (ref 79.5–101.0)
MONO%: 4.2 % (ref 0.0–14.0)
NEUT%: 66.3 % (ref 38.4–76.8)
Platelets: 280 10*3/uL (ref 145–400)
lymph#: 2.3 10*3/uL (ref 0.9–3.3)

## 2010-12-05 LAB — BASIC METABOLIC PANEL
BUN: 21 mg/dL (ref 6–23)
Glucose, Bld: 146 mg/dL — ABNORMAL HIGH (ref 70–99)
Potassium: 5.5 mEq/L — ABNORMAL HIGH (ref 3.5–5.3)

## 2010-12-14 ENCOUNTER — Ambulatory Visit
Admission: RE | Admit: 2010-12-14 | Discharge: 2010-12-14 | Disposition: A | Discharge status: Home / Self Care | Payer: PRIVATE HEALTH INSURANCE | Source: Ambulatory Visit | Attending: Radiation Oncology | Admitting: Radiation Oncology

## 2010-12-14 DIAGNOSIS — N644 Mastodynia: Secondary | ICD-10-CM | POA: Insufficient documentation

## 2010-12-14 DIAGNOSIS — Z418 Encounter for other procedures for purposes other than remedying health state: Secondary | ICD-10-CM | POA: Insufficient documentation

## 2010-12-14 DIAGNOSIS — R112 Nausea with vomiting, unspecified: Secondary | ICD-10-CM | POA: Insufficient documentation

## 2010-12-14 DIAGNOSIS — C50419 Malignant neoplasm of upper-outer quadrant of unspecified female breast: Secondary | ICD-10-CM | POA: Insufficient documentation

## 2011-02-06 ENCOUNTER — Encounter (HOSPITAL_BASED_OUTPATIENT_CLINIC_OR_DEPARTMENT_OTHER): Payer: PRIVATE HEALTH INSURANCE | Admitting: Oncology

## 2011-02-06 ENCOUNTER — Other Ambulatory Visit: Payer: Self-pay | Admitting: Oncology

## 2011-02-06 DIAGNOSIS — C50419 Malignant neoplasm of upper-outer quadrant of unspecified female breast: Secondary | ICD-10-CM

## 2011-02-06 LAB — COMPREHENSIVE METABOLIC PANEL
ALT: 15 U/L (ref 0–35)
Alkaline Phosphatase: 68 U/L (ref 39–117)
Creatinine, Ser: 1.84 mg/dL — ABNORMAL HIGH (ref 0.50–1.10)
Sodium: 140 mEq/L (ref 135–145)
Total Bilirubin: 0.3 mg/dL (ref 0.3–1.2)
Total Protein: 6.4 g/dL (ref 6.0–8.3)

## 2011-02-06 LAB — CBC WITH DIFFERENTIAL/PLATELET
BASO%: 0.3 % (ref 0.0–2.0)
LYMPH%: 27.7 % (ref 14.0–49.7)
MCHC: 33.5 g/dL (ref 31.5–36.0)
MCV: 88 fL (ref 79.5–101.0)
MONO#: 0.5 10*3/uL (ref 0.1–0.9)
MONO%: 8.5 % (ref 0.0–14.0)
Platelets: 284 10*3/uL (ref 145–400)
RBC: 3.45 10*6/uL — ABNORMAL LOW (ref 3.70–5.45)
WBC: 5.4 10*3/uL (ref 3.9–10.3)

## 2011-02-21 LAB — COMPREHENSIVE METABOLIC PANEL
AST: 17
Albumin: 4
BUN: 15
CO2: 31
Calcium: 10
Creatinine, Ser: 1.09
GFR calc Af Amer: >60
GFR calc non Af Amer: 51 — ABNORMAL LOW
Total Bilirubin: 0.4

## 2011-02-21 LAB — CBC
HCT: 39.8
MCHC: 34.9
MCV: 94.5
Platelets: 269

## 2011-03-02 ENCOUNTER — Ambulatory Visit: Payer: PRIVATE HEALTH INSURANCE | Admitting: Radiation Oncology

## 2011-03-09 LAB — BASIC METABOLIC PANEL
BUN: 30 — ABNORMAL HIGH
CO2: 28
Calcium: 9.5
Chloride: 102
Creatinine, Ser: 1.01
GFR calc Af Amer: >60
GFR calc non Af Amer: 56 — ABNORMAL LOW
Glucose, Bld: 90
Potassium: 4.1
Sodium: 135

## 2011-03-09 LAB — CBC
HCT: 39.1
Hemoglobin: 13.7
MCHC: 35.1
MCV: 89.8
Platelets: 296
RBC: 4.35
RDW: 12.5
WBC: 6.7

## 2011-03-20 ENCOUNTER — Ambulatory Visit
Admission: RE | Admit: 2011-03-20 | Discharge: 2011-03-20 | Disposition: A | Discharge status: Home / Self Care | Payer: PRIVATE HEALTH INSURANCE | Source: Ambulatory Visit | Attending: Family Medicine | Admitting: Family Medicine

## 2011-03-20 ENCOUNTER — Other Ambulatory Visit: Payer: Self-pay | Admitting: Family Medicine

## 2011-03-20 DIAGNOSIS — R0602 Shortness of breath: Secondary | ICD-10-CM

## 2011-04-22 HISTORY — PX: COLON SURGERY: SHX602

## 2011-04-24 ENCOUNTER — Ambulatory Visit: Payer: PRIVATE HEALTH INSURANCE | Admitting: Oncology

## 2011-04-24 ENCOUNTER — Other Ambulatory Visit: Payer: PRIVATE HEALTH INSURANCE | Admitting: Lab

## 2011-04-30 ENCOUNTER — Emergency Department (HOSPITAL_COMMUNITY): Payer: PRIVATE HEALTH INSURANCE

## 2011-04-30 ENCOUNTER — Inpatient Hospital Stay (HOSPITAL_COMMUNITY)
Admission: EM | Admit: 2011-04-30 | Discharge: 2011-05-09 | DRG: 330 | Disposition: A | Discharge status: Home / Self Care | Payer: PRIVATE HEALTH INSURANCE | Attending: Internal Medicine | Admitting: Internal Medicine

## 2011-04-30 ENCOUNTER — Other Ambulatory Visit: Payer: Self-pay

## 2011-04-30 ENCOUNTER — Encounter: Payer: Self-pay | Admitting: Emergency Medicine

## 2011-04-30 DIAGNOSIS — C50919 Malignant neoplasm of unspecified site of unspecified female breast: Secondary | ICD-10-CM | POA: Diagnosis present

## 2011-04-30 DIAGNOSIS — F339 Major depressive disorder, recurrent, unspecified: Secondary | ICD-10-CM | POA: Diagnosis present

## 2011-04-30 DIAGNOSIS — K5669 Other intestinal obstruction: Secondary | ICD-10-CM | POA: Diagnosis present

## 2011-04-30 DIAGNOSIS — R531 Weakness: Secondary | ICD-10-CM

## 2011-04-30 DIAGNOSIS — K922 Gastrointestinal hemorrhage, unspecified: Secondary | ICD-10-CM

## 2011-04-30 DIAGNOSIS — S0510XA Contusion of eyeball and orbital tissues, unspecified eye, initial encounter: Secondary | ICD-10-CM | POA: Diagnosis present

## 2011-04-30 DIAGNOSIS — D649 Anemia, unspecified: Secondary | ICD-10-CM

## 2011-04-30 DIAGNOSIS — Z9181 History of falling: Secondary | ICD-10-CM

## 2011-04-30 DIAGNOSIS — F329 Major depressive disorder, single episode, unspecified: Secondary | ICD-10-CM

## 2011-04-30 DIAGNOSIS — R600 Localized edema: Secondary | ICD-10-CM | POA: Diagnosis present

## 2011-04-30 DIAGNOSIS — Z79899 Other long term (current) drug therapy: Secondary | ICD-10-CM

## 2011-04-30 DIAGNOSIS — F17200 Nicotine dependence, unspecified, uncomplicated: Secondary | ICD-10-CM | POA: Diagnosis present

## 2011-04-30 DIAGNOSIS — K6389 Other specified diseases of intestine: Secondary | ICD-10-CM | POA: Diagnosis present

## 2011-04-30 DIAGNOSIS — I10 Essential (primary) hypertension: Secondary | ICD-10-CM | POA: Diagnosis present

## 2011-04-30 DIAGNOSIS — M549 Dorsalgia, unspecified: Secondary | ICD-10-CM | POA: Diagnosis present

## 2011-04-30 DIAGNOSIS — F1120 Opioid dependence, uncomplicated: Secondary | ICD-10-CM | POA: Diagnosis present

## 2011-04-30 DIAGNOSIS — F419 Anxiety disorder, unspecified: Secondary | ICD-10-CM | POA: Diagnosis present

## 2011-04-30 DIAGNOSIS — R609 Edema, unspecified: Secondary | ICD-10-CM | POA: Diagnosis not present

## 2011-04-30 DIAGNOSIS — G8929 Other chronic pain: Secondary | ICD-10-CM | POA: Diagnosis present

## 2011-04-30 DIAGNOSIS — D62 Acute posthemorrhagic anemia: Secondary | ICD-10-CM

## 2011-04-30 DIAGNOSIS — E876 Hypokalemia: Secondary | ICD-10-CM

## 2011-04-30 DIAGNOSIS — Z87891 Personal history of nicotine dependence: Secondary | ICD-10-CM

## 2011-04-30 DIAGNOSIS — Z720 Tobacco use: Secondary | ICD-10-CM

## 2011-04-30 DIAGNOSIS — S2000XA Contusion of breast, unspecified breast, initial encounter: Secondary | ICD-10-CM | POA: Diagnosis present

## 2011-04-30 DIAGNOSIS — D500 Iron deficiency anemia secondary to blood loss (chronic): Secondary | ICD-10-CM | POA: Diagnosis present

## 2011-04-30 DIAGNOSIS — R296 Repeated falls: Secondary | ICD-10-CM

## 2011-04-30 DIAGNOSIS — F32A Depression, unspecified: Secondary | ICD-10-CM

## 2011-04-30 DIAGNOSIS — W19XXXA Unspecified fall, initial encounter: Secondary | ICD-10-CM | POA: Diagnosis present

## 2011-04-30 DIAGNOSIS — N179 Acute kidney failure, unspecified: Secondary | ICD-10-CM | POA: Diagnosis present

## 2011-04-30 DIAGNOSIS — C183 Malignant neoplasm of hepatic flexure: Principal | ICD-10-CM | POA: Diagnosis present

## 2011-04-30 HISTORY — DX: Malignant neoplasm of unspecified site of unspecified female breast: C50.919

## 2011-04-30 HISTORY — DX: Depression, unspecified: F32.A

## 2011-04-30 HISTORY — DX: Dorsalgia, unspecified: M54.9

## 2011-04-30 HISTORY — DX: Essential (primary) hypertension: I10

## 2011-04-30 HISTORY — DX: Major depressive disorder, single episode, unspecified: F32.9

## 2011-04-30 HISTORY — DX: Other chronic pain: G89.29

## 2011-04-30 LAB — COMPREHENSIVE METABOLIC PANEL
AST: 29 U/L (ref 0–37)
Albumin: 3.2 g/dL — ABNORMAL LOW (ref 3.5–5.2)
BUN: 30 mg/dL — ABNORMAL HIGH (ref 6–23)
Creatinine, Ser: 1.06 mg/dL (ref 0.50–1.10)
Total Protein: 6.3 g/dL (ref 6.0–8.3)

## 2011-04-30 LAB — DIFFERENTIAL
Basophils Absolute: 0 10*3/uL (ref 0.0–0.1)
Basophils Relative: 1 % (ref 0–1)
Eosinophils Absolute: 0.1 10*3/uL (ref 0.0–0.7)
Monocytes Absolute: 0.7 10*3/uL (ref 0.1–1.0)
Monocytes Relative: 12 % (ref 3–12)
Neutro Abs: 3.3 10*3/uL (ref 1.7–7.7)
Neutrophils Relative %: 57 % (ref 43–77)

## 2011-04-30 LAB — CBC
HCT: 21.8 % — ABNORMAL LOW (ref 36.0–46.0)
Hemoglobin: 6.7 g/dL — CL (ref 12.0–15.0)
MCH: 21.7 pg — ABNORMAL LOW (ref 26.0–34.0)
MCHC: 30.7 g/dL (ref 30.0–36.0)
RDW: 15 % (ref 11.5–15.5)

## 2011-04-30 LAB — URINALYSIS, ROUTINE W REFLEX MICROSCOPIC
Bilirubin Urine: NEGATIVE
Ketones, ur: NEGATIVE mg/dL
Leukocytes, UA: NEGATIVE
Nitrite: NEGATIVE
Protein, ur: NEGATIVE mg/dL
pH: 6 (ref 5.0–8.0)

## 2011-04-30 MED ORDER — HYDROCODONE-ACETAMINOPHEN 5-325 MG PO TABS
1.0000 | ORAL_TABLET | Freq: Once | ORAL | Status: AC
  Administered 2011-04-30: 1 via ORAL
  Filled 2011-04-30: qty 1

## 2011-04-30 MED ORDER — POTASSIUM CHLORIDE 10 MEQ/100ML IV SOLN
10.0000 meq | INTRAVENOUS | Status: DC
  Administered 2011-05-01: 10 meq via INTRAVENOUS
  Filled 2011-04-30: qty 100

## 2011-04-30 NOTE — ED Notes (Signed)
Pt's niece reports intermittent confusion x 2 months.  Reports frequent falls and generalized swelling x 2 months.

## 2011-04-30 NOTE — ED Provider Notes (Addendum)
History     CSN: [id number] Arrival date & time: 04/30/2011  7:07 PM   First MD Initiated Contact with Patient 04/30/11 2105      Chief Complaint  Patient presents with  . Altered Mental Status     HPI  65yo female h/o breast ca s/p lumpectomy, radiation last several months ago (july per family) pw AMS. Patient states that she does live alone. She states that over the past 2 months she has experience frequent falls. She's fallen several times per week. She states her last fall was 2-3 days ago. She fell forward and hit her head. She states that she is losing her balance, but she feels uncoordinated and that she feels diffusely weak. She complains of mild right frontal headache for the last fall. Denies neck pain, back pain. She denies chest pain, shortness of breath, fever, chills. Denies hematuria/dysuria/freq/urgency. She denies nausea, vomiting, constipation, diarrhea. She has noted no blood in her stool. She's also noticed slightly increased in bilateral of 70 edema for the past few weeks. She's been compliant with her Lasix. She does request a refill of her medications she states that "I lost them somewhere in the house over the past 2 days" her niece is concerned because she states it patient is only wanted to consume cheese for the past 2 days. She states she consumed approximately 3 pounds of cheese as well as water and ginger ale.  She states that she is scheduled for an outpatient CT scan of her head the next one to 2 days.   ED Notes, ED Provider Notes from 04/30/11 0000 to 04/30/11 19:27:50       Ester Rink, RN 04/30/2011 19:25      Pt's niece reports intermittent confusion x 2 months. Reports frequent falls and generalized swelling x 2 months.    Past Medical History  Diagnosis Date  . Breast cancer   . Hypertension   . Depression   . Back pain, chronic     Past Surgical History  Procedure Date  . Back surgery   . Shoulder surgery   . Tumor removal Tumor removed R  breast  . Lumbar disc surgery   . Partial hysterectomy     History reviewed. No pertinent family history.  History  Substance Use Topics  . Smoking status: Former Research scientist (life sciences)  . Smokeless tobacco: Not on file  . Alcohol Use: No    OB History    Grav Para Term Preterm Abortions TAB SAB Ect Mult Living                  Review of Systems  All other systems reviewed and are negative.   except as noted HPI  Allergies  Review of patient's allergies indicates no known allergies.  Home Medications   Current Outpatient Rx  Name Route Sig Dispense Refill  . ALPRAZOLAM 0.5 MG PO TABS Oral Take 0.5 mg by mouth 3 (three) times daily as needed. For anxiety     . ANASTROZOLE 1 MG PO TABS Oral Take 1 mg by mouth at bedtime.      . BUPROPION HCL ER (XL) 300 MG PO TB24 Oral Take 300 mg by mouth daily.      Marland Kitchen FLUVOXAMINE MALEATE 50 MG PO TABS Oral Take 50-100 mg by mouth at bedtime. 1 tab in the am, 2 tabs in the pm     . FUROSEMIDE 20 MG PO TABS Oral Take 40 mg by mouth 2 (two) times daily.      Marland Kitchen  NEBIVOLOL HCL 10 MG PO TABS Oral Take 10 mg by mouth daily.      . OXYCODONE HCL ER 60 MG PO TB12 Oral Take 1 tablet by mouth 3 (three) times daily as needed. For pain     . QUETIAPINE FUMARATE 50 MG PO TABS Oral Take 50 mg by mouth at bedtime.        BP 135/53  Pulse 91  Temp(Src) 98 F (36.7 C) (Oral)  Resp 20  SpO2 98%  Physical Exam  Nursing note and vitals reviewed. Constitutional: She is oriented to person, place, and time. She appears well-developed.  HENT:  Mouth/Throat: Oropharynx is clear and moist.       R supra orbital ecchymosis with min edema , ttp  Eyes: Conjunctivae and EOM are normal. Pupils are equal, round, and reactive to light. Right eye exhibits no discharge. Left eye exhibits no discharge.  Neck: Normal range of motion. Neck supple.       No midline c/t/l/s ttp  Well healed lumbar surgical scar  Cardiovascular: Normal rate, regular rhythm, normal heart sounds and  intact distal pulses.   Pulmonary/Chest: Effort normal and breath sounds normal. No respiratory distress. She has no wheezes. She has no rales.  Abdominal: Soft. She exhibits no distension. There is no tenderness. There is no rebound and no guarding.  Genitourinary: Guaiac positive stool.       Int and ext hemorrhoids Brown stool Heme pos  Musculoskeletal: Normal range of motion. She exhibits edema.       2+ pitting edema b/l LE with diffuse ttp  Neurological: She is alert and oriented to person, place, and time. No cranial nerve deficit. She exhibits normal muscle tone. Coordination normal.       Strength 5/5 all extremities No pronator drift No facial droop   Skin: Skin is warm and dry. No rash noted. There is pallor.  Psychiatric: She has a normal mood and affect.    Date: 05/01/2011  Rate: 63  Rhythm: normal sinus rhythm and sinus arrhythmia  QRS Axis: normal  Intervals: normal  ST/T Wave abnormalities: nonspecific ST/T changes  Conduction Disutrbances:none  Narrative Interpretation:   Old EKG Reviewed: changes noted   ED Course  Procedures (including critical care time)  Labs Reviewed  CBC - Abnormal; Notable for the following:    RBC 3.09 (*)    Hemoglobin 6.7 (*)    HCT 21.8 (*)    MCV 70.6 (*)    MCH 21.7 (*)    All other components within normal limits  COMPREHENSIVE METABOLIC PANEL - Abnormal; Notable for the following:    Potassium 2.8 (*)    BUN 30 (*)    Albumin 3.2 (*)    Total Bilirubin 0.2 (*)    GFR calc non Af Amer 54 (*)    GFR calc Af Amer 63 (*)    All other components within normal limits  URINALYSIS, ROUTINE W REFLEX MICROSCOPIC  DIFFERENTIAL  TYPE AND SCREEN  OCCULT BLOOD, POC DEVICE  OCCULT BLOOD X 1 CARD TO LAB, STOOL   Dg Chest 2 View  04/30/2011  *RADIOLOGY REPORT*  Clinical Data: Altered mental status.  Right-sided chest pain. History of breast cancer and radiation.  CHEST - 2 VIEW  Comparison: 03/20/2011  Findings: Normal heart size  and pulmonary vascularity.  Pulmonary hyperinflation consistent with emphysema.  Scattered fibrosis in the lungs.  Old right rib fractures.  Surgical clips in the right chest.  Mild blunting of right costophrenic  angle may suggest fluid or thickened pleura.  Degenerative changes in the thoracic spine. No focal airspace consolidation in the lungs.  No pneumothorax. Postoperative changes in both shoulders.  Postoperative changes in the lumbar spine.  IMPRESSION: Overall stable appearance of the chest since previous study. Emphysematous changes and scattered fibrosis.  Blunting of the right costophrenic angle suggesting fluid or thickened pleura.  Original Report Authenticated By: Neale Burly, M.D.   Ct Head Wo Contrast  04/30/2011  *RADIOLOGY REPORT*  Clinical Data: Altered mental status.  Syncope.  Abrasion to side the right eye, possibly from fall.  CT HEAD WITHOUT CONTRAST  Technique:  Contiguous axial images were obtained from the base of the skull through the vertex without contrast.  Comparison: None.  Findings: Diffuse cerebral atrophy.  Ventricular dilatation consistent with central atrophy.  Low attenuation changes in the deep white matter suggesting small vessel ischemia.  Focal low attenuation changes in the basal ganglia bilaterally consistent with old lacunar infarcts.  No mass effect or midline shift.  No abnormal extra-axial fluid collections.  The gray-white matter junctions are distinct.  The basal cisterns are not effaced.  No evidence of acute intracranial hemorrhage.  No depressed skull fractures.  Visualized paranasal sinuses and mastoid air cells are not opacified.  Calcification of the intracranial arteries.  IMPRESSION: Chronic atrophy and small vessel ischemic changes.  No evidence of acute intracranial hemorrhage, mass lesion, or acute infarct.  Original Report Authenticated By: Neale Burly, M.D.     1. Anemia   2. GI bleed   3. Falls frequently   4. Hypokalemia      MDM   Patient presents with frequent falls, weakness,. She is alert and oriented at this time. Her labs are remarkable for a hemoglobin of 6.7 this is down from 10 approximately 2 months ago. She has brown stool that is heme positive. Hypokalemia to 2.8. Repletion ordered. Ct head and CXR, U/A unremarkable. Type and screen sent. Discussed admission with [facility] hospitalist.    Blair Heys, MD 05/01/11 Jeff[place], MD 05/01/11 [phone]

## 2011-05-01 ENCOUNTER — Encounter (HOSPITAL_COMMUNITY): Payer: Self-pay | Admitting: Internal Medicine

## 2011-05-01 DIAGNOSIS — D62 Acute posthemorrhagic anemia: Secondary | ICD-10-CM

## 2011-05-01 DIAGNOSIS — D649 Anemia, unspecified: Secondary | ICD-10-CM

## 2011-05-01 DIAGNOSIS — R600 Localized edema: Secondary | ICD-10-CM | POA: Diagnosis present

## 2011-05-01 DIAGNOSIS — I10 Essential (primary) hypertension: Secondary | ICD-10-CM | POA: Diagnosis present

## 2011-05-01 DIAGNOSIS — K922 Gastrointestinal hemorrhage, unspecified: Secondary | ICD-10-CM

## 2011-05-01 DIAGNOSIS — R296 Repeated falls: Secondary | ICD-10-CM

## 2011-05-01 DIAGNOSIS — E876 Hypokalemia: Secondary | ICD-10-CM

## 2011-05-01 DIAGNOSIS — F329 Major depressive disorder, single episode, unspecified: Secondary | ICD-10-CM | POA: Diagnosis present

## 2011-05-01 DIAGNOSIS — F32A Depression, unspecified: Secondary | ICD-10-CM | POA: Diagnosis present

## 2011-05-01 DIAGNOSIS — R531 Weakness: Secondary | ICD-10-CM | POA: Diagnosis present

## 2011-05-01 LAB — BASIC METABOLIC PANEL
BUN: 21 mg/dL (ref 6–23)
CO2: 25 mEq/L (ref 19–32)
CO2: 26 mEq/L (ref 19–32)
Chloride: 106 mEq/L (ref 96–112)
Chloride: 108 mEq/L (ref 96–112)
GFR calc Af Amer: 71 mL/min — ABNORMAL LOW (ref >90)
Glucose, Bld: 98 mg/dL (ref 70–99)
Potassium: 2.2 mEq/L — CL (ref 3.5–5.1)
Potassium: 2.2 mEq/L — CL (ref 3.5–5.1)
Sodium: 139 mEq/L (ref 135–145)

## 2011-05-01 LAB — CBC
HCT: 21.1 % — ABNORMAL LOW (ref 36.0–46.0)
Hemoglobin: 6.3 g/dL — CL (ref 12.0–15.0)
MCH: 21.1 pg — ABNORMAL LOW (ref 26.0–34.0)
MCV: 70.8 fL — ABNORMAL LOW (ref 78.0–100.0)
Platelets: 243 10*3/uL (ref 150–400)
RBC: 2.98 MIL/uL — ABNORMAL LOW (ref 3.87–5.11)
WBC: 3.5 10*3/uL — ABNORMAL LOW (ref 4.0–10.5)

## 2011-05-01 LAB — FOLATE: Folate: >20 ng/mL

## 2011-05-01 LAB — IRON AND TIBC
Iron: 12 ug/dL — ABNORMAL LOW (ref 42–135)
Saturation Ratios: 3 % — ABNORMAL LOW (ref 20–55)
TIBC: 438 ug/dL (ref 250–470)
UIBC: 426 ug/dL — ABNORMAL HIGH (ref 125–400)

## 2011-05-01 LAB — ABO/RH: ABO/RH(D): O NEG

## 2011-05-01 LAB — VITAMIN B12: Vitamin B-12: 1095 pg/mL — ABNORMAL HIGH (ref 211–911)

## 2011-05-01 LAB — PREPARE RBC (CROSSMATCH)

## 2011-05-01 LAB — RETICULOCYTES: RBC.: 2.82 MIL/uL — ABNORMAL LOW (ref 3.87–5.11)

## 2011-05-01 MED ORDER — ACETAMINOPHEN 325 MG PO TABS
650.0000 mg | ORAL_TABLET | Freq: Four times a day (QID) | ORAL | Status: DC | PRN
  Administered 2011-05-02 – 2011-05-06 (×5): 650 mg via ORAL
  Filled 2011-05-01 (×6): qty 2

## 2011-05-01 MED ORDER — NEBIVOLOL HCL 10 MG PO TABS
10.0000 mg | ORAL_TABLET | Freq: Every day | ORAL | Status: DC
  Administered 2011-05-01 – 2011-05-09 (×8): 10 mg via ORAL
  Filled 2011-05-01 (×10): qty 1

## 2011-05-01 MED ORDER — ALPRAZOLAM 0.25 MG PO TABS
0.2500 mg | ORAL_TABLET | Freq: Once | ORAL | Status: AC
  Administered 2011-05-01: 0.25 mg via ORAL
  Filled 2011-05-01: qty 1

## 2011-05-01 MED ORDER — PANTOPRAZOLE SODIUM 40 MG IV SOLR
40.0000 mg | Freq: Two times a day (BID) | INTRAVENOUS | Status: DC
  Administered 2011-05-01 – 2011-05-06 (×10): 40 mg via INTRAVENOUS
  Filled 2011-05-01 (×13): qty 40

## 2011-05-01 MED ORDER — FUROSEMIDE 10 MG/ML IJ SOLN
20.0000 mg | Freq: Once | INTRAMUSCULAR | Status: AC
  Administered 2011-05-01: 20 mg via INTRAVENOUS
  Filled 2011-05-01 (×2): qty 2

## 2011-05-01 MED ORDER — SODIUM CHLORIDE 0.9 % IV SOLN
INTRAVENOUS | Status: DC
  Administered 2011-05-01: 02:00:00 via INTRAVENOUS

## 2011-05-01 MED ORDER — ACETAMINOPHEN 650 MG RE SUPP: 650.0000 mg | Freq: Four times a day (QID) | RECTAL | Status: DC | PRN

## 2011-05-01 MED ORDER — ZOLPIDEM TARTRATE 5 MG PO TABS: 5.0000 mg | ORAL_TABLET | Freq: Every evening | ORAL | Status: DC | PRN

## 2011-05-01 MED ORDER — BUPROPION HCL ER (XL) 300 MG PO TB24
300.0000 mg | ORAL_TABLET | Freq: Every day | ORAL | Status: DC
  Administered 2011-05-01 – 2011-05-09 (×8): 300 mg via ORAL
  Filled 2011-05-01 (×10): qty 1

## 2011-05-01 MED ORDER — ACETAMINOPHEN 325 MG PO TABS
650.0000 mg | ORAL_TABLET | Freq: Once | ORAL | Status: AC
  Administered 2011-05-01: 650 mg via ORAL

## 2011-05-01 MED ORDER — ONDANSETRON HCL 4 MG/2ML IJ SOLN
4.0000 mg | Freq: Four times a day (QID) | INTRAMUSCULAR | Status: DC | PRN
  Administered 2011-05-03: 4 mg via INTRAVENOUS
  Filled 2011-05-01 (×2): qty 2

## 2011-05-01 MED ORDER — DIPHENHYDRAMINE HCL 50 MG/ML IJ SOLN
25.0000 mg | Freq: Once | INTRAMUSCULAR | Status: AC
  Administered 2011-05-01: 25 mg via INTRAVENOUS
  Filled 2011-05-01: qty 1

## 2011-05-01 MED ORDER — POTASSIUM CHLORIDE CRYS ER 20 MEQ PO TBCR
20.0000 meq | EXTENDED_RELEASE_TABLET | Freq: Two times a day (BID) | ORAL | Status: DC
  Administered 2011-05-01 – 2011-05-03 (×3): 20 meq via ORAL
  Filled 2011-05-01 (×6): qty 1

## 2011-05-01 MED ORDER — POTASSIUM CHLORIDE 10 MEQ/100ML IV SOLN: 10.0000 meq | INTRAVENOUS | Status: DC

## 2011-05-01 MED ORDER — ALUM & MAG HYDROXIDE-SIMETH 200-200-20 MG/5ML PO SUSP: 30.0000 mL | Freq: Four times a day (QID) | ORAL | Status: DC | PRN

## 2011-05-01 MED ORDER — ONDANSETRON HCL 4 MG PO TABS: 4.0000 mg | ORAL_TABLET | Freq: Four times a day (QID) | ORAL | Status: DC | PRN

## 2011-05-01 MED ORDER — ALPRAZOLAM 0.5 MG PO TABS
0.5000 mg | ORAL_TABLET | Freq: Three times a day (TID) | ORAL | Status: DC | PRN
  Administered 2011-05-01 – 2011-05-09 (×16): 0.5 mg via ORAL
  Filled 2011-05-01 (×15): qty 1

## 2011-05-01 MED ORDER — ALPRAZOLAM 0.5 MG PO TABS
0.5000 mg | ORAL_TABLET | Freq: Two times a day (BID) | ORAL | Status: DC | PRN
  Administered 2011-05-01 (×2): 0.5 mg via ORAL
  Filled 2011-05-01 (×2): qty 1

## 2011-05-01 MED ORDER — POTASSIUM CHLORIDE 10 MEQ/100ML IV SOLN
10.0000 meq | INTRAVENOUS | Status: AC
  Administered 2011-05-01 – 2011-05-02 (×4): 10 meq via INTRAVENOUS
  Filled 2011-05-01 (×4): qty 100

## 2011-05-01 MED ORDER — OXYCODONE HCL 5 MG PO TABS
5.0000 mg | ORAL_TABLET | ORAL | Status: DC | PRN
  Administered 2011-05-01 – 2011-05-02 (×6): 5 mg via ORAL
  Filled 2011-05-01 (×6): qty 1

## 2011-05-01 MED ORDER — HYDROMORPHONE HCL PF 1 MG/ML IJ SOLN
0.5000 mg | INTRAMUSCULAR | Status: DC | PRN
  Administered 2011-05-01 – 2011-05-03 (×3): 1 mg via INTRAVENOUS
  Filled 2011-05-01 (×3): qty 1

## 2011-05-01 MED ORDER — POTASSIUM CHLORIDE IN NACL 20-0.9 MEQ/L-% IV SOLN
INTRAVENOUS | Status: DC
  Administered 2011-05-02: 06:00:00 via INTRAVENOUS
  Filled 2011-05-01 (×3): qty 1000

## 2011-05-01 MED ORDER — ANASTROZOLE 1 MG PO TABS
1.0000 mg | ORAL_TABLET | Freq: Every day | ORAL | Status: DC
  Administered 2011-05-01 – 2011-05-08 (×8): 1 mg via ORAL
  Filled 2011-05-01 (×9): qty 1

## 2011-05-01 MED ORDER — POTASSIUM CHLORIDE 10 MEQ/100ML IV SOLN
10.0000 meq | INTRAVENOUS | Status: AC
  Administered 2011-05-01 (×4): 10 meq via INTRAVENOUS
  Filled 2011-05-01 (×3): qty 100

## 2011-05-01 NOTE — ED Notes (Signed)
Please contact pt's niece at [phone] [name].

## 2011-05-01 NOTE — Progress Notes (Signed)
PATIENT DETAILS Name: [name] Age: 65 y.o. Sex: female Date of Birth: 06/04/1945 Admit Date: 04/30/2011 [id number] D, MD  Subjective: Very anxious, crying at times. But denies any black tarry stools. Denies any hematemesis. Has been having weakness and lethargy.  Objective: Vital signs in last 24 hours: Filed Vitals:   05/01/11 0456 05/01/11 1337 05/01/11 1430 05/01/11 1445  BP: 137/73 152/70 167/80 161/77  Pulse: 63 74 72 62  Temp: 97.7 F (36.5 C) 98.5 F (36.9 C) 98 F (36.7 C) 97.9 F (36.6 C)  TempSrc: Oral Oral Oral Oral  Resp: 19 20 20 20   Height:      Weight:      SpO2: 94% 92%      Weight change:   Body mass index is 26.61 kg/(m^2).  Intake/Output from previous day:  Intake/Output Summary (Last 24 hours) at 05/01/11 1541 Last data filed at 05/01/11 1430  Gross per 24 hour  Intake 1213.75 ml  Output      0 ml  Net 1213.75 ml    PHYSICAL EXAM: Gen Exam: Awake and alert with clear speech.   Neck: Supple, No JVD.   Chest: B/L Clear.  Soreness in her right breast. I examined with the RN at bedside, no obvious hematoma or bruising evident. She apparently fell and hit her right breast area. CVS: S1 S2 Regular, no murmurs.  Abdomen: soft, BS +, non tender, non distended.  Extremities: 1+ edema, lower extremities warm to touch. Neurologic: Non Focal.   Skin: No Rash.   Wounds: N/A.    CONSULTS:  GI  LAB RESULTS: CBC  Lab 05/01/11 0917 05/01/11 0605 04/30/11 2246  WBC -- 3.5* 5.8  HGB 5.9* 6.3* 6.7*  HCT 19.9* 21.1* 21.8*  PLT -- 243 288  MCV -- 70.8* 70.6*  MCH -- 21.1* 21.7*  MCHC -- 29.9* 30.7  RDW -- 15.2 15.0  LYMPHSABS -- -- 1.7  MONOABS -- -- 0.7  EOSABS -- -- 0.1  BASOSABS -- -- 0.0  BANDABS -- -- --    Chemistries   Lab 05/01/11 0917 05/01/11 0605 04/30/11 2246  NA 142 139 142  K 2.2* 2.2* 2.8*  CL 108 106 107  CO2 26 25 26   GLUCOSE 102* 98 82  BUN 21 24* 30*  CREATININE 0.96 1.00 1.06  CALCIUM 9.3 9.0 9.8    MG -- -- --    GFR Estimated Creatinine Clearance: 56.9 ml/min (by C-G formula based on Cr of 0.96).  Coagulation profile No results found for this basename: INR:5,PROTIME:5 in the last 168 hours  Cardiac Enzymes No results found for this basename: CK:3,CKMB:3,TROPONINI:3,MYOGLOBIN:3 in the last 168 hours  No components found with this basename: POCBNP:3 No results found for this basename: DDIMER:2 in the last 72 hours No results found for this basename: HGBA1C:2 in the last 72 hours No results found for this basename: CHOL:2,HDL:2,LDLCALC:2,TRIG:2,CHOLHDL:2,LDLDIRECT:2 in the last 72 hours No results found for this basename: TSH,T4TOTAL,FREET3,T3FREE,THYROIDAB in the last 72 hours  Basename 05/01/11 0917  VITAMINB12 1095*  FOLATE >20.0  FERRITIN 12  TIBC 438  IRON 12*  RETICCTPCT 1.9   No results found for this basename: LIPASE:2,AMYLASE:2 in the last 72 hours  Urine Studies No results found for this basename: UACOL:2,UAPR:2,USPG:2,UPH:2,UTP:2,UGL:2,UKET:2,UBIL:2,UHGB:2,UNIT:2,UROB:2,ULEU:2,UEPI:2,UWBC:2,URBC:2,UBAC:2,CAST:2,CRYS:2,UCOM:2,BILUA:2 in the last 72 hours  MICROBIOLOGY: No results found for this or any previous visit (from the past 240 hour(s)).  RADIOLOGY STUDIES/RESULTS: Dg Chest 2 View  04/30/2011  *RADIOLOGY REPORT*  Clinical Data: Altered mental status.  Right-sided chest  pain. History of breast cancer and radiation.  CHEST - 2 VIEW  Comparison: 03/20/2011  Findings: Normal heart size and pulmonary vascularity.  Pulmonary hyperinflation consistent with emphysema.  Scattered fibrosis in the lungs.  Old right rib fractures.  Surgical clips in the right chest.  Mild blunting of right costophrenic angle may suggest fluid or thickened pleura.  Degenerative changes in the thoracic spine. No focal airspace consolidation in the lungs.  No pneumothorax. Postoperative changes in both shoulders.  Postoperative changes in the lumbar spine.  IMPRESSION: Overall stable  appearance of the chest since previous study. Emphysematous changes and scattered fibrosis.  Blunting of the right costophrenic angle suggesting fluid or thickened pleura.  Original Report Authenticated By: Neale Burly, M.D.   Ct Head Wo Contrast  04/30/2011  *RADIOLOGY REPORT*  Clinical Data: Altered mental status.  Syncope.  Abrasion to side the right eye, possibly from fall.  CT HEAD WITHOUT CONTRAST  Technique:  Contiguous axial images were obtained from the base of the skull through the vertex without contrast.  Comparison: None.  Findings: Diffuse cerebral atrophy.  Ventricular dilatation consistent with central atrophy.  Low attenuation changes in the deep white matter suggesting small vessel ischemia.  Focal low attenuation changes in the basal ganglia bilaterally consistent with old lacunar infarcts.  No mass effect or midline shift.  No abnormal extra-axial fluid collections.  The gray-white matter junctions are distinct.  The basal cisterns are not effaced.  No evidence of acute intracranial hemorrhage.  No depressed skull fractures.  Visualized paranasal sinuses and mastoid air cells are not opacified.  Calcification of the intracranial arteries.  IMPRESSION: Chronic atrophy and small vessel ischemic changes.  No evidence of acute intracranial hemorrhage, mass lesion, or acute infarct.  Original Report Authenticated By: Neale Burly, M.D.    MEDICATIONS: Scheduled Meds:   . acetaminophen  650 mg Oral Once  . ALPRAZolam  0.25 mg Oral Once  . anastrozole  1 mg Oral QHS  . buPROPion  300 mg Oral Daily  . diphenhydrAMINE  25 mg Intravenous Once  . furosemide  20 mg Intravenous Once  . HYDROcodone-acetaminophen  1 tablet Oral Once  . nebivolol  10 mg Oral Daily  . pantoprazole (PROTONIX) IV  40 mg Intravenous Q12H  . potassium chloride  10 mEq Intravenous Q1 Hr x 4  . potassium chloride  10 mEq Intravenous Q1 Hr x 4  . potassium chloride  20 mEq Oral BID  . DISCONTD:  potassium chloride  10 mEq Intravenous Q1 Hr x 4  . DISCONTD: potassium chloride  10 mEq Intravenous Q1 Hr x 4   Continuous Infusions:   . 0.9 % NaCl with KCl 20 mEq / L    . DISCONTD: sodium chloride 75 mL/hr at 05/01/11 0145   PRN Meds:.acetaminophen, acetaminophen, ALPRAZolam, alum & mag hydroxide-simeth, HYDROmorphone, ondansetron (ZOFRAN) IV, ondansetron, oxyCODONE, zolpidem, DISCONTD: ALPRAZolam  Antibiotics: Anti-infectives    None      Assessment/Plan: Patient Active Hospital Problem List: Anemia    Assessment: Microcytic and iron deficient. Highly suspicious for blood loss from her bowels. However her stools are brown, there is no history of overt blood loss. Stools are FOBT positive.    Plan: We'll transfuse a total of 3 units, have consulted gastroenterology. Patient wishes to pursue outpatient workup. I will try and talk to her to see if she will let us do this as an inpatient. However she is very anxious and wishes to be discharged as soon  as possible. I have already made an appointment for her to see Dr. Paulita Fujita on 05/03/2011 at 3:30 PM, in case she does decide to have this workup done as an outpatient   GI bleed    Assessment: Suspicion for occult GI bleed. Given age and microcytosis colon cancer is a possibility.   Plan: EGD/colonoscopy if patient is agreeable to do as an inpatient, otherwise will transfuse and see if we can get her an early appointment. Continue with PPI for now.  Weakness generalized/fatigue    Assessment: Likely secondary to symptomatic anemia. Possibly polypharmacy playing a role as well.    Plan: Transfuse and see if his symptoms get better. Will consult psych to see if we can minimize some of her medications.   Falls frequently    Assessment: Not sure if this is from her anemia or actually from polypharmacy (I suspect the latter) CT of the head is negative. Patient does not have any focal deficits on exam. Her speech is clear. .   Plan:  will need  to minimize medications.  Will consult psych.she is also on chronic narcotics for chronic back pain-will see if that can be cut down as well   Hypokalemia    Assessment: This is secondary to Lasix. This has been persistent in spite of the patient getting 4 runs of 10 mEq of potassium early this morning.    Plan: Will give another 4 runs of 10 mEq of potassium today. We'll also place on oral potassium supplementation needed   HTN (hypertension)    Assessment: Currently stable.   Plan: Will resume bystolic, suspected blood loss is occult and not overt.   Anxiety and depression    Assessment: Ongoing anxiety, easily cries. On numerous psych meds at home including Xanax, Wellbutrin, Luvox and Seroquel.   Plan: We'll get psychiatry to see her and see if her medications can be cut down and simplified.   Lower extremity edema    Assessment: ? Etiology. Urinalysis does not show proteinuria. LFTs are unremarkable.    Plan: Continue Lasix, we'll check a 2-D echo.   Disposition: Remain inpatient.   DVT Prophylaxis: SCDs.   Code Status: Full code.   Jonetta Osgood, MD. 05/01/2011, 3:41 PM

## 2011-05-01 NOTE — H&P (Addendum)
DATE OF ADMISSION:  05/01/2011  PCP:    Kristine Garbe, MD   Chief Complaint: Increased Weakness and Falls   HPI: [name] is an 65 y.o. female with a history of Breast Cancer S/p Lumpectomy and Radiation therapy to Right Breast (diagnosed in 08/2010 and last treatments in 11/2010) presenting with complaints of worsening weakness and increased falls over past month but worse this past week.  She also reports having increased SOB and DOE.  She denies having any chest pain.  Her last fall resulted in increased pain and bruising of the right eye, and right breast area.  She was evaluated in the ED and underwent a CT scan of the Brain which was negative for acute findings, and her laboratory studies revealed abnormalities with a hemoglobin level of 6.7 and a potassium level of 2.8.  Patient denies having nay nausea vomiting or diarrhea, and denies hematemesis, hematochezia, and melena. A rectal examination was performed  Buy the EDP which was found to be Heme +.         Past Medical History  Diagnosis Date  . Breast cancer   . Hypertension   . Depression   . Back pain, chronic     Past Surgical History  Procedure Date  . Back surgery   . Shoulder surgery   . Tumor removal Tumor removed R breast  . Lumbar disc surgery   . Partial hysterectomy     Medications:  HOME MEDS: Prior to Admission medications   Medication Sig Start Date End Date Taking? Authorizing Provider  ALPRAZolam Duanne Moron) 0.5 MG tablet Take 0.5 mg by mouth 3 (three) times daily as needed. For anxiety    Yes Historical Provider, MD  anastrozole (ARIMIDEX) 1 MG tablet Take 1 mg by mouth at bedtime.     Yes Historical Provider, MD  buPROPion (WELLBUTRIN XL) 300 MG 24 hr tablet Take 300 mg by mouth daily.     Yes Historical Provider, MD  fluvoxaMINE (LUVOX) 50 MG tablet Take 50-100 mg by mouth at bedtime. 1 tab in the am, 2 tabs in the pm    Yes Historical Provider, MD  furosemide (LASIX) 20 MG tablet Take 40 mg by  mouth 2 (two) times daily.     Yes Historical Provider, MD  nebivolol (BYSTOLIC) 10 MG tablet Take 10 mg by mouth daily.     Yes Historical Provider, MD  Oxycodone HCl (OXYCONTIN) 60 MG TB12 Take 1 tablet by mouth 3 (three) times daily as needed. For pain    Yes Historical Provider, MD  QUEtiapine (SEROQUEL) 50 MG tablet Take 50 mg by mouth at bedtime.     Yes Historical Provider, MD    Allergies:  No Known Allergies  Social History:   reports that she has quit smoking. She does not have any smokeless tobacco history on file. She reports that she does not drink alcohol or use illicit drugs.  Family History: Family History  Problem Relation Age of Onset  . Diabetes Mother   . Hypertension Mother   . Cancer Mother     Review of Systems:  The patient denies anorexia, fever, weight loss, vision loss, hoarseness, chest pain, syncope, dyspnea on exertion, peripheral edema, balance deficits, hemoptysis, abdominal pain, melena, hematochezia, severe indigestion/heartburn, hematuria, incontinence, genital sores, muscle weakness, suspicious skin lesions, transient blindness, depression, unusual weight change, abnormal bleeding, enlarged lymph nodes, angioedema.   Physical Exam:  GEN:  Pleasant 65 year old well nourished and well developed Caucasian female  examined and found to be in no acute distress; cooperative with exam Filed Vitals:   04/30/11 1909 04/30/11 2357 05/01/11 0100 05/01/11 0145  BP: 132/48 135/53 136/71 143/73  Pulse: 91  64 63  Temp: 98 F (36.7 C)   97.7 F (36.5 C)  TempSrc: Oral   Oral  Resp: 18 20  19   Height:    5\' 4"  (1.626 m)  Weight:    70.308 kg (155 lb)  SpO2: 100% 98% 100% 96%   Blood pressure 143/73, pulse 63, temperature 97.7 F (36.5 C), temperature source Oral, resp. rate 19, height 5\' 4"  (1.626 m), weight 70.308 kg (155 lb), SpO2 96.00%. PSYCH: She is alert and oriented x4; does not appear anxious does not appear depressed; affect is normal HEENT:  Normocephalic and + Ecchymosis right Periorbital area, Mucous membranes pink; PERRLA; EOM intact; Fundi:  Benign;  No scleral icterus, Nares: Patent, Oropharynx: Edentulous (Dentures),  Neck:  FROM, no cervical lymphadenopathy nor thyromegaly or carotid bruit; no JVD; CHEST WALL:tenderness along the Lateral Right Breast Area CHEST: Normal respiration, clear to auscultation bilaterally HEART: Regular rate and rhythm; no murmurs rubs or gallops BACK: No kyphosis or scoliosis; no CVA tenderness ABDOMEN: Positive Bowel Sounds, Obese, soft non-tender; no masses, no organomegaly. Rectal Exam: Not done EXTREMITIES: No bone or joint deformity; age-appropriate arthropathy of the hands and knees; no cyanosis, clubbing or edema; no ulcerations. Genitalia: not examined PULSES: 2+ and symmetric SKIN: Normal hydration no rash or ulceration CNS: Cranial nerves 2-12 grossly intact no focal neurologic deficit   Labs & Imaging Results for orders placed during the hospital encounter of 04/30/11 (from the past 48 hour(s))  URINALYSIS, ROUTINE W REFLEX MICROSCOPIC     Status: Normal   Collection Time   04/30/11  9:52 PM      Component Value Range Comment   Color, Urine YELLOW  YELLOW     APPearance CLEAR  CLEAR     Specific Gravity, Urine 1.013  1.005 - 1.030     pH 6.0  5.0 - 8.0     Glucose, UA NEGATIVE  NEGATIVE (mg/dL)    Hgb urine dipstick NEGATIVE  NEGATIVE     Bilirubin Urine NEGATIVE  NEGATIVE     Ketones, ur NEGATIVE  NEGATIVE (mg/dL)    Protein, ur NEGATIVE  NEGATIVE (mg/dL)    Urobilinogen, UA 0.2  0.0 - 1.0 (mg/dL)    Nitrite NEGATIVE  NEGATIVE     Leukocytes, UA NEGATIVE  NEGATIVE  MICROSCOPIC NOT DONE ON URINES WITH NEGATIVE PROTEIN, BLOOD, LEUKOCYTES, NITRITE, OR GLUCOSE <1000 mg/dL.  CBC     Status: Abnormal   Collection Time   04/30/11 10:46 PM      Component Value Range Comment   WBC 5.8  4.0 - 10.5 (K/uL)    RBC 3.09 (*) 3.87 - 5.11 (MIL/uL)    Hemoglobin 6.7 (*) 12.0 - 15.0 (g/dL)      HCT 21.8 (*) 36.0 - 46.0 (%)    MCV 70.6 (*) 78.0 - 100.0 (fL)    MCH 21.7 (*) 26.0 - 34.0 (pg)    MCHC 30.7  30.0 - 36.0 (g/dL)    RDW 15.0  11.5 - 15.5 (%)    Platelets 288  150 - 400 (K/uL)   DIFFERENTIAL     Status: Normal   Collection Time   04/30/11 10:46 PM      Component Value Range Comment   Neutrophils Relative 57  43 - 77 (%)  Neutro Abs 3.3  1.7 - 7.7 (K/uL)    Lymphocytes Relative 29  12 - 46 (%)    Lymphs Abs 1.7  0.7 - 4.0 (K/uL)    Monocytes Relative 12  3 - 12 (%)    Monocytes Absolute 0.7  0.1 - 1.0 (K/uL)    Eosinophils Relative 1  0 - 5 (%)    Eosinophils Absolute 0.1  0.0 - 0.7 (K/uL)    Basophils Relative 1  0 - 1 (%)    Basophils Absolute 0.0  0.0 - 0.1 (K/uL)   COMPREHENSIVE METABOLIC PANEL     Status: Abnormal   Collection Time   04/30/11 10:46 PM      Component Value Range Comment   Sodium 142  135 - 145 (mEq/L)    Potassium 2.8 (*) 3.5 - 5.1 (mEq/L)    Chloride 107  96 - 112 (mEq/L)    CO2 26  19 - 32 (mEq/L)    Glucose, Bld 82  70 - 99 (mg/dL)    BUN 30 (*) 6 - 23 (mg/dL)    Creatinine, Ser 1.06  0.50 - 1.10 (mg/dL)    Calcium 9.8  8.4 - 10.5 (mg/dL)    Total Protein 6.3  6.0 - 8.3 (g/dL)    Albumin 3.2 (*) 3.5 - 5.2 (g/dL)    AST 29  0 - 37 (U/L)    ALT 18  0 - 35 (U/L)    Alkaline Phosphatase 81  39 - 117 (U/L)    Total Bilirubin 0.2 (*) 0.3 - 1.2 (mg/dL)    GFR calc non Af Amer 54 (*) >90 (mL/min)    GFR calc Af Amer 63 (*) >90 (mL/min)   OCCULT BLOOD, POC DEVICE     Status: Normal   Collection Time   04/30/11 11:54 PM      Component Value Range Comment   Fecal Occult Bld POSITIVE     TYPE AND SCREEN     Status: Normal (Preliminary result)   Collection Time   05/01/11 12:10 AM      Component Value Range Comment   ABO/RH(D) O NEG      Antibody Screen NEG      Sample Expiration 05/04/2011      Unit Number [id number]      Blood Component Type RBC CPDA1, LR      Unit division 00      Status of Unit ALLOCATED      Transfusion Status  OK TO TRANSFUSE      Crossmatch Result Compatible      Unit Number [id number]      Blood Component Type RED CELLS,LR      Unit division 00      Status of Unit ALLOCATED      Transfusion Status OK TO TRANSFUSE      Crossmatch Result Compatible     ABO/RH     Status: Normal (Preliminary result)   Collection Time   05/01/11 12:10 AM      Component Value Range Comment   ABO/RH(D) O NEG     PREPARE RBC (CROSSMATCH)     Status: Normal   Collection Time   05/01/11  2:00 AM      Component Value Range Comment   Order Confirmation ORDER PROCESSED BY BLOOD BANK      Dg Chest 2 View  04/30/2011  *RADIOLOGY REPORT*  Clinical Data: Altered mental status.  Right-sided chest pain. History of breast cancer and radiation.  CHEST -  2 VIEW  Comparison: 03/20/2011  Findings: Normal heart size and pulmonary vascularity.  Pulmonary hyperinflation consistent with emphysema.  Scattered fibrosis in the lungs.  Old right rib fractures.  Surgical clips in the right chest.  Mild blunting of right costophrenic angle may suggest fluid or thickened pleura.  Degenerative changes in the thoracic spine. No focal airspace consolidation in the lungs.  No pneumothorax. Postoperative changes in both shoulders.  Postoperative changes in the lumbar spine.  IMPRESSION: Overall stable appearance of the chest since previous study. Emphysematous changes and scattered fibrosis.  Blunting of the right costophrenic angle suggesting fluid or thickened pleura.  Original Report Authenticated By: Neale Burly, M.D.   Ct Head Wo Contrast  04/30/2011  *RADIOLOGY REPORT*  Clinical Data: Altered mental status.  Syncope.  Abrasion to side the right eye, possibly from fall.  CT HEAD WITHOUT CONTRAST  Technique:  Contiguous axial images were obtained from the base of the skull through the vertex without contrast.  Comparison: None.  Findings: Diffuse cerebral atrophy.  Ventricular dilatation consistent with central atrophy.  Low attenuation  changes in the deep white matter suggesting small vessel ischemia.  Focal low attenuation changes in the basal ganglia bilaterally consistent with old lacunar infarcts.  No mass effect or midline shift.  No abnormal extra-axial fluid collections.  The gray-white matter junctions are distinct.  The basal cisterns are not effaced.  No evidence of acute intracranial hemorrhage.  No depressed skull fractures.  Visualized paranasal sinuses and mastoid air cells are not opacified.  Calcification of the intracranial arteries.  IMPRESSION: Chronic atrophy and small vessel ischemic changes.  No evidence of acute intracranial hemorrhage, mass lesion, or acute infarct.  Original Report Authenticated By: Neale Burly, M.D.      Assessment/Plan: 1. GI/Rectal Bleeding 2. Anemia due to #1 3. Hypokalemia 4. ARF 5. Weakness 6. Falls 7. Contusions Right Eye, and Right Breast 8. Breast Cancer History  Plan:  1-2.  Transfuse 2 units of Packed RBCs,Serial Hemoglobin checks, and IV Protonix ordered. GI Consult needed.   3.      Replete K+, check Magnesium. 4.      IVFs for rehydration 5.-7   Due to #1 and #2, and # 3.  Fall precautions.   8.      Stable.   9.      SCDs for DVT prophylaxis. 10.    Other plans as per orders.       CODE STATUS:      FULL CODE          Ulysess Witz C 05/01/2011, 4:50 AM

## 2011-05-01 NOTE — Progress Notes (Signed)
   CARE MANAGEMENT NOTE 05/01/2011  Patient:  [name], [name]   Account Number:  [id number]  Date Initiated:  05/01/2011  Documentation initiated by:  Tomi Bamberger  Subjective/Objective Assessment:   dx anemia  admit- lives with family members.     Action/Plan:   Anticipated DC Date:  05/03/2011   Anticipated DC Plan:  [place]  CM consult      Choice offered to / List presented to:             Status of service:  In process, will continue to follow Medicare Important Message given?   (If response is "NO", the following Medicare IM given date fields will be blank) Date Medicare IM given:   Date Additional Medicare IM given:    Discharge Disposition:    Per UR Regulation:    Comments:  05/01/11 17:01 Tomi Bamberger RN, BSN [phone] patient lives with family members, NCM will continue to follow for dc needs.

## 2011-05-01 NOTE — Progress Notes (Signed)
Utilization review completed.  

## 2011-05-01 NOTE — ED Notes (Signed)
Pt ambuated to restroom without issue.

## 2011-05-01 NOTE — Consult Note (Signed)
[facility] Consultation Note  Requesting Physician:  Dr. Oren Binet  Reason for Consultation:  Anemia, hemoccult-positive stool  HPI: [name] is a 65 y.o. female with history of breast cancer with recent lumpectomy and radiation, completed July 2012, is kindly admitted for evaluation of weakness.  Symptoms started couple months ago, became progressively worse, necessitating ED evaluation.  Was found to have Hgb ~ 6.5 with hemoccult positive stool.  She has no nausea, vomiting, dysphagia, odynophagia, loss-of-appetite, unintentional weight loss.  Denies overt blood in stool.  She has never had a colonoscopy or endoscopy.  Uses BC powders' occasionally for headache.  There is no known family history of colon cancer or polyps.   Past Medical History  Diagnosis Date  . Breast cancer   . Hypertension   . Depression   . Back pain, chronic     Past Surgical History  Procedure Date  . Back surgery   . Shoulder surgery   . Tumor removal Tumor removed R breast  . Lumbar disc surgery   . Partial hysterectomy     Prior to Admission medications   Medication Sig Start Date End Date Taking? Authorizing Provider  ALPRAZolam Duanne Moron) 0.5 MG tablet Take 0.5 mg by mouth 3 (three) times daily as needed. For anxiety    Yes Historical Provider, MD  anastrozole (ARIMIDEX) 1 MG tablet Take 1 mg by mouth at bedtime.     Yes Historical Provider, MD  buPROPion (WELLBUTRIN XL) 300 MG 24 hr tablet Take 300 mg by mouth daily.     Yes Historical Provider, MD  fluvoxaMINE (LUVOX) 50 MG tablet Take 50-100 mg by mouth at bedtime. 1 tab in the am, 2 tabs in the pm    Yes Historical Provider, MD  furosemide (LASIX) 20 MG tablet Take 40 mg by mouth 2 (two) times daily.     Yes Historical Provider, MD  nebivolol (BYSTOLIC) 10 MG tablet Take 10 mg by mouth daily.     Yes Historical Provider, MD  Oxycodone HCl (OXYCONTIN) 60 MG TB12 Take 1 tablet by mouth 3 (three) times daily as needed. For pain     Yes Historical Provider, MD  QUEtiapine (SEROQUEL) 50 MG tablet Take 50 mg by mouth at bedtime.     Yes Historical Provider, MD    Current Facility-Administered Medications  Medication Dose Route Frequency Provider Last Rate Last Dose  . 0.9 %  sodium chloride infusion   Intravenous Continuous Shanker Ghimire 75 mL/hr at 05/01/11 0145    . acetaminophen (TYLENOL) tablet 650 mg  650 mg Oral Q6H PRN Theressa Millard, MD       Or  . acetaminophen (TYLENOL) suppository 650 mg  650 mg Rectal Q6H PRN Theressa Millard, MD      . ALPRAZolam Duanne Moron) tablet 0.25 mg  0.25 mg Oral Once Shanker Ghimire   0.25 mg at 05/01/11 0841  . ALPRAZolam Duanne Moron) tablet 0.5 mg  0.5 mg Oral BID PRN Mary A. Lynch   0.5 mg at 05/01/11 0407  . alum & mag hydroxide-simeth (MAALOX/MYLANTA) 200-200-20 MG/5ML suspension 30 mL  30 mL Oral Q6H PRN Theressa Millard, MD      . HYDROcodone-acetaminophen (NORCO) 5-325 MG per tablet 1 tablet  1 tablet Oral Once Blair Heys, MD   1 tablet at 04/30/11 2308  . HYDROmorphone (DILAUDID) injection 0.5-1 mg  0.5-1 mg Intravenous Q3H PRN Theressa Millard, MD      . ondansetron [facility]) tablet 4 mg  4 mg Oral Q6H PRN Theressa Millard, MD       Or  . ondansetron (ZOFRAN) injection 4 mg  4 mg Intravenous Q6H PRN Theressa Millard, MD      . oxyCODONE (Oxy IR/ROXICODONE) immediate release tablet 5 mg  5 mg Oral Q4H PRN Theressa Millard, MD   5 mg at 05/01/11 0741  . pantoprazole (PROTONIX) injection 40 mg  40 mg Intravenous Q12H Harvette C Jenkins, MD      . potassium chloride 10 mEq in 100 mL IVPB  10 mEq Intravenous Q1 Hr x 4 Harvette C Jenkins, MD   10 mEq at 05/01/11 0502  . zolpidem (AMBIEN) tablet 5 mg  5 mg Oral QHS PRN Theressa Millard, MD      . DISCONTD: potassium chloride 10 mEq in 100 mL IVPB  10 mEq Intravenous Q1 Hr x 4 Blair Heys, MD   10 mEq at 05/01/11 0037  . DISCONTD: potassium chloride 10 mEq in 100 mL IVPB  10 mEq Intravenous Q1 Hr x 4 Theressa Millard, MD        Allergies as of 04/30/2011  . (No Known Allergies)    Family History  Problem Relation Age of Onset  . Diabetes Mother   . Hypertension Mother   . Cancer Mother     History   Social History  . Marital Status: Divorced    Spouse Name: N/A    Number of Children: N/A  . Years of Education: N/A   Occupational History  . Not on file.   Social History Main Topics  . Smoking status: Former Research scientist (life sciences)  . Smokeless tobacco: Not on file  . Alcohol Use: No  . Drug Use: No  . Sexually Active:    Other Topics Concern  . Not on file   Social History Narrative  . No narrative on file    Review of Systems: Positive for: Gen: Denies any fever, chills, rigors, night sweats, c/o anorexia, c/o fatigue, c/o weakness, malaise, involuntary weight loss, and sleep disorder CV: Denies chest pain, angina, palpitations, syncope, orthopnea, PND, peripheral edema, and claudication. Resp: Denies , cough, sputum, wheezing, coughing up blood, c/o dyspnea. GI: Described in detail in HPI.    GU : Denies urinary burning, blood in urine, urinary frequency, urinary hesitancy, nocturnal urination, and urinary incontinence. MS: Denies joint pain or swelling.  Denies muscle weakness, cramps, atrophy.  Derm: Denies rash, itching, oral ulcerations, hives, unhealing ulcers.  Psych: Denies depression, anxiety, memory loss, suicidal ideation, hallucinations,  and confusion. Heme: Denies bruising, bleeding, and enlarged lymph nodes. Neuro:  Denies any headaches, dizziness, paresthesias. Endo:  Denies any problems with DM, thyroid, adrenal function.  Physical Exam: Vital signs in last 24 hours: Temp:  [97.7 F (36.5 C)-98 F (36.7 C)] 97.7 F (36.5 C) (12/10 0456) Pulse Rate:  [63-91] 63  (12/10 0456) Resp:  [18-20] 19  (12/10 0456) BP: (132-143)/(48-73) 137/73 mmHg (12/10 0456) SpO2:  [94 %-100 %] 94 % (12/10 0456) Weight:  [70.308 kg (155 lb)] 155 lb (70.308 kg) (12/10 0145) Last BM  Date: 04/30/11 General:   Alert,  Well-developed, well-nourished, pleasant and cooperative in NAD Head:  Normocephalic and atraumatic. Eyes:  Sclera clear, no icterus.   Conjunctiva pale Ears:  Normal auditory acuity. Nose:  No deformity, discharge,  or lesions. Mouth:  No deformity or lesions.  Oropharynx pink & moist. Neck:  Supple; no masses or thyromegaly. Lungs:  Clear throughout to auscultation.  No wheezes, crackles, or rhonchi. No acute distress. Heart:  Regular rate and rhythm; no murmurs, clicks, rubs,  or gallops. Abdomen:  Soft, nontender and nondistended. No masses, hepatosplenomegaly or hernias noted. Normal bowel sounds, without guarding, and without rebound.     Msk:  Soreness in region of right lower breast.  Symmetrical without gross deformities. Normal posture. Pulses:  Normal pulses noted. Extremities:  Without clubbing or edema. Neurologic:  Alert and  oriented x4;  Diffuse weak but otherwise grossly normal neurologically. Skin:  Intact without significant lesions or rashes. Psych:  Alert and cooperative. Normal mood and affect.   Lab Results:  [facility] 05/01/11 0605 04/30/11 2246  WBC 3.5* 5.8  HGB 6.3* 6.7*  HCT 21.1* 21.8*  PLT 243 288   BMET  Basename 05/01/11 0605 04/30/11 2246  NA 139 142  K 2.2* 2.8*  CL 106 107  CO2 25 26  GLUCOSE 98 82  BUN 24* 30*  CREATININE 1.00 1.06  CALCIUM 9.0 9.8   LFT  Basename 04/30/11 2246  PROT 6.3  ALBUMIN 3.2*  AST 29  ALT 18  ALKPHOS 81  BILITOT 0.2*  BILIDIR --  IBILI --   PT/INR No results found for this basename: LABPROT:2,INR:2 in the last 72 hours  Studies/Results: Dg Chest 2 View  04/30/2011  *RADIOLOGY REPORT*  Clinical Data: Altered mental status.  Right-sided chest pain. History of breast cancer and radiation.  CHEST - 2 VIEW  Comparison: 03/20/2011  Findings: Normal heart size and pulmonary vascularity.  Pulmonary hyperinflation consistent with emphysema.  Scattered fibrosis in the lungs.   Old right rib fractures.  Surgical clips in the right chest.  Mild blunting of right costophrenic angle may suggest fluid or thickened pleura.  Degenerative changes in the thoracic spine. No focal airspace consolidation in the lungs.  No pneumothorax. Postoperative changes in both shoulders.  Postoperative changes in the lumbar spine.  IMPRESSION: Overall stable appearance of the chest since previous study. Emphysematous changes and scattered fibrosis.  Blunting of the right costophrenic angle suggesting fluid or thickened pleura.  Original Report Authenticated By: Neale Burly, M.D.   Ct Head Wo Contrast  04/30/2011  *RADIOLOGY REPORT*  Clinical Data: Altered mental status.  Syncope.  Abrasion to side the right eye, possibly from fall.  CT HEAD WITHOUT CONTRAST  Technique:  Contiguous axial images were obtained from the base of the skull through the vertex without contrast.  Comparison: None.  Findings: Diffuse cerebral atrophy.  Ventricular dilatation consistent with central atrophy.  Low attenuation changes in the deep white matter suggesting small vessel ischemia.  Focal low attenuation changes in the basal ganglia bilaterally consistent with old lacunar infarcts.  No mass effect or midline shift.  No abnormal extra-axial fluid collections.  The gray-white matter junctions are distinct.  The basal cisterns are not effaced.  No evidence of acute intracranial hemorrhage.  No depressed skull fractures.  Visualized paranasal sinuses and mastoid air cells are not opacified.  Calcification of the intracranial arteries.  IMPRESSION: Chronic atrophy and small vessel ischemic changes.  No evidence of acute intracranial hemorrhage, mass lesion, or acute infarct.  Original Report Authenticated By: Neale Burly, M.D.    Impression:  1.  Anemia with microcytosis. 2.  Hemoccult-positive stool. 3.  Recent surgical (lumpectomy) and radiation treatments for breast cancer, completed July 2012.  Plan:  1.   Transfuse 2 U PRBCs, as has already been done. 2.  Patient wants to go home and have expedited outpatient  endoscopy/colonoscopy.  I would prefer having this done as inpatient, but she very clearly wants to go home and do this as outpatient.  She is aware of potential need for readmission for symptomatic anemia before we can get her endoscopy/colonoscopy done. 3.  I will discuss with hospitalist team.   LOS: 1 day   Yaniel Limbaugh M  05/01/2011, 10:24 AM

## 2011-05-02 ENCOUNTER — Inpatient Hospital Stay (HOSPITAL_COMMUNITY): Payer: PRIVATE HEALTH INSURANCE

## 2011-05-02 DIAGNOSIS — D649 Anemia, unspecified: Secondary | ICD-10-CM

## 2011-05-02 LAB — BASIC METABOLIC PANEL
BUN: 11 mg/dL (ref 6–23)
CO2: 28 mEq/L (ref 19–32)
Calcium: 9 mg/dL (ref 8.4–10.5)
Calcium: 10 mg/dL (ref 8.4–10.5)
Chloride: 108 mEq/L (ref 96–112)
Chloride: 104 mEq/L (ref 96–112)
Creatinine, Ser: 0.87 mg/dL (ref 0.50–1.10)
Creatinine, Ser: 0.76 mg/dL (ref 0.50–1.10)
GFR calc Af Amer: 80 mL/min — ABNORMAL LOW (ref >90)
GFR calc Af Amer: >90 mL/min (ref >90)
GFR calc non Af Amer: 69 mL/min — ABNORMAL LOW (ref >90)
Glucose, Bld: 88 mg/dL (ref 70–99)
Potassium: 2.7 mEq/L — CL (ref 3.5–5.1)
Sodium: 145 mEq/L (ref 135–145)

## 2011-05-02 LAB — CBC
HCT: 34.7 % — ABNORMAL LOW (ref 36.0–46.0)
HCT: 35.1 % — ABNORMAL LOW (ref 36.0–46.0)
Hemoglobin: 11.1 g/dL — ABNORMAL LOW (ref 12.0–15.0)
Hemoglobin: 11.5 g/dL — ABNORMAL LOW (ref 12.0–15.0)
MCH: 23.8 pg — ABNORMAL LOW (ref 26.0–34.0)
MCH: 24.2 pg — ABNORMAL LOW (ref 26.0–34.0)
MCHC: 32 g/dL (ref 30.0–36.0)
MCHC: 32.8 g/dL (ref 30.0–36.0)
MCV: 74.3 fL — ABNORMAL LOW (ref 78.0–100.0)
MCV: 73.9 fL — ABNORMAL LOW (ref 78.0–100.0)
Platelets: 244 10*3/uL (ref 150–400)
Platelets: 253 10*3/uL (ref 150–400)
RBC: 4.67 MIL/uL (ref 3.87–5.11)
RBC: 4.75 MIL/uL (ref 3.87–5.11)
RDW: 16.1 % — ABNORMAL HIGH (ref 11.5–15.5)
RDW: 15.9 % — ABNORMAL HIGH (ref 11.5–15.5)
WBC: 6.6 10*3/uL (ref 4.0–10.5)
WBC: 7.3 10*3/uL (ref 4.0–10.5)

## 2011-05-02 LAB — PRO B NATRIURETIC PEPTIDE: Pro B Natriuretic peptide (BNP): 3468 pg/mL — ABNORMAL HIGH (ref 0–125)

## 2011-05-02 LAB — APTT: aPTT: 29 seconds (ref 24–37)

## 2011-05-02 LAB — PROTIME-INR: INR: 1.02 (ref 0.00–1.49)

## 2011-05-02 MED ORDER — POTASSIUM CHLORIDE CRYS ER 20 MEQ PO TBCR
40.0000 meq | EXTENDED_RELEASE_TABLET | Freq: Once | ORAL | Status: AC
  Administered 2011-05-02: 40 meq via ORAL
  Filled 2011-05-02: qty 2

## 2011-05-02 MED ORDER — POTASSIUM CHLORIDE 10 MEQ/100ML IV SOLN
10.0000 meq | INTRAVENOUS | Status: AC
  Administered 2011-05-02 (×4): 10 meq via INTRAVENOUS
  Filled 2011-05-02 (×4): qty 100

## 2011-05-02 MED ORDER — FUROSEMIDE 40 MG PO TABS
40.0000 mg | ORAL_TABLET | Freq: Every day | ORAL | Status: DC
  Administered 2011-05-02 – 2011-05-07 (×5): 40 mg via ORAL
  Filled 2011-05-02 (×6): qty 1

## 2011-05-02 MED ORDER — BISACODYL 5 MG PO TBEC
10.0000 mg | DELAYED_RELEASE_TABLET | Freq: Once | ORAL | Status: AC
  Administered 2011-05-02: 10 mg via ORAL
  Filled 2011-05-02: qty 2

## 2011-05-02 MED ORDER — POTASSIUM CHLORIDE 20 MEQ/15ML (10%) PO LIQD
40.0000 meq | Freq: Two times a day (BID) | ORAL | Status: DC
  Administered 2011-05-02: 40 meq via ORAL
  Filled 2011-05-02 (×2): qty 30

## 2011-05-02 MED ORDER — SODIUM CHLORIDE 0.9 % IV SOLN
Freq: Once | INTRAVENOUS | Status: AC
  Administered 2011-05-03: 500 mL via INTRAVENOUS

## 2011-05-02 MED ORDER — PEG 3350-KCL-NA BICARB-NACL 420 G PO SOLR
4000.0000 mL | Freq: Once | ORAL | Status: AC
  Administered 2011-05-02: 4000 mL via ORAL
  Filled 2011-05-02: qty 4000

## 2011-05-02 MED ORDER — POTASSIUM CHLORIDE CRYS ER 20 MEQ PO TBCR
40.0000 meq | EXTENDED_RELEASE_TABLET | Freq: Once | ORAL | Status: AC
  Administered 2011-05-02: 40 meq via ORAL

## 2011-05-02 MED ORDER — OXYCODONE HCL 20 MG PO TB12
40.0000 mg | ORAL_TABLET | Freq: Two times a day (BID) | ORAL | Status: DC
  Administered 2011-05-02 – 2011-05-04 (×5): 40 mg via ORAL
  Filled 2011-05-02: qty 2
  Filled 2011-05-02: qty 1
  Filled 2011-05-02 (×2): qty 2
  Filled 2011-05-02: qty 1
  Filled 2011-05-02: qty 2

## 2011-05-02 NOTE — Progress Notes (Signed)
Patient ID: [name], female   DOB: [birth year], 65 y.o.   MRN: [name]  PATIENT DETAILS Name: [name] Age: 65 y.o. Sex: female Date of Birth: 1946-04-18 Admit Date: 04/30/2011 [id number] D, MD  Subjective: Appears SOB, states she gets like this from time to time.  Started when she got up to go to the bathroom.  Denies CP.  Objective: Vital signs in last 24 hours: Filed Vitals:   05/02/11 0230 05/02/11 0330 05/02/11 0430 05/02/11 0450  BP: 162/70 148/60 163/53 165/74  Pulse: 96 94 98 88  Temp: 98.2 F (36.8 C) 98.2 F (36.8 C) 98.1 F (36.7 C) 97.8 F (36.6 C)  TempSrc: Oral Oral Oral Oral  Resp: 18 18 18 18   Height:      Weight:      SpO2:        Weight change:   Body mass index is 26.61 kg/(m^2).  Intake/Output from previous day:  Intake/Output Summary (Last 24 hours) at 05/02/11 0954 Last data filed at 05/02/11 0450  Gross per 24 hour  Intake 1608.34 ml  Output      0 ml  Net 1608.34 ml    PHYSICAL EXAM: Gen Exam: Awake and alert with clear speech.   Neck: Supple, No JVD.   Chest: CTA, taking short inspirations, approximately 20 breaths per min CVS: S1 S2 Regular, no murmurs.  Abdomen: soft, BS +, non tender, non distended.  Extremities: 1+ edema, lower extremities warm to touch. Neurologic: Non Focal.   Skin: No Rash.    CONSULTS:  GI to perform egd/colon on 05/03/11 Psych called.   LAB RESULTS: CBC  Lab 05/02/11 0834 05/02/11 0620 05/01/11 0917 05/01/11 0605 04/30/11 2246  WBC 7.3 6.6 -- 3.5* 5.8  HGB 11.5* 11.1* 5.9* 6.3* 6.7*  HCT 35.1* 34.7* 19.9* 21.1* 21.8*  PLT 253 244 -- 243 288  MCV 73.9* 74.3* -- 70.8* 70.6*  MCH 24.2* 23.8* -- 21.1* 21.7*  MCHC 32.8 32.0 -- 29.9* 30.7  RDW 15.9* 16.1* -- 15.2 15.0  LYMPHSABS -- -- -- -- 1.7  MONOABS -- -- -- -- 0.7  EOSABS -- -- -- -- 0.1  BASOSABS -- -- -- -- 0.0  BANDABS -- -- -- -- --    Chemistries   Lab 05/02/11 0620 05/01/11 0917 05/01/11 0605 04/30/11 2246  NA  145 142 139 142  K 2.7* 2.2* 2.2* 2.8*  CL 108 108 106 107  CO2 28 26 25 26   GLUCOSE 88 102* 98 82  BUN 11 21 24* 30*  CREATININE 0.87 0.96 1.00 1.06  CALCIUM 9.0 9.3 9.0 9.8  MG -- -- -- --    GFR Estimated Creatinine Clearance: 62.8 ml/min (by C-G formula based on Cr of 0.87).  Coagulation profile No results found for this basename: INR:5,PROTIME:5 in the last 168 hours  Cardiac Enzymes No results found for this basename: CK:3,CKMB:3,TROPONINI:3,MYOGLOBIN:3 in the last 168 hours  Basename 05/01/11 0917  VITAMINB12 1095*  FOLATE >20.0  FERRITIN 12  TIBC 438  IRON 12*  RETICCTPCT 1.9    RADIOLOGY STUDIES/RESULTS: Dg Chest 2 View  04/30/2011  *RADIOLOGY REPORT*  Clinical Data: Altered mental status.  Right-sided chest pain. History of breast cancer and radiation.  CHEST - 2 VIEW  Comparison: 03/20/2011  Findings: Normal heart size and pulmonary vascularity.  Pulmonary hyperinflation consistent with emphysema.  Scattered fibrosis in the lungs.  Old right rib fractures.  Surgical clips in the right chest.  Mild blunting of right costophrenic angle  may suggest fluid or thickened pleura.  Degenerative changes in the thoracic spine. No focal airspace consolidation in the lungs.  No pneumothorax. Postoperative changes in both shoulders.  Postoperative changes in the lumbar spine.  IMPRESSION: Overall stable appearance of the chest since previous study. Emphysematous changes and scattered fibrosis.  Blunting of the right costophrenic angle suggesting fluid or thickened pleura.  Original Report Authenticated By: Neale Burly, M.D.   Ct Head Wo Contrast  04/30/2011  *RADIOLOGY REPORT*  Clinical Data: Altered mental status.  Syncope.  Abrasion to side the right eye, possibly from fall.  CT HEAD WITHOUT CONTRAST  Technique:  Contiguous axial images were obtained from the base of the skull through the vertex without contrast.  Comparison: None.  Findings: Diffuse cerebral atrophy.   Ventricular dilatation consistent with central atrophy.  Low attenuation changes in the deep white matter suggesting small vessel ischemia.  Focal low attenuation changes in the basal ganglia bilaterally consistent with old lacunar infarcts.  No mass effect or midline shift.  No abnormal extra-axial fluid collections.  The gray-white matter junctions are distinct.  The basal cisterns are not effaced.  No evidence of acute intracranial hemorrhage.  No depressed skull fractures.  Visualized paranasal sinuses and mastoid air cells are not opacified.  Calcification of the intracranial arteries.  IMPRESSION: Chronic atrophy and small vessel ischemic changes.  No evidence of acute intracranial hemorrhage, mass lesion, or acute infarct.  Original Report Authenticated By: Neale Burly, M.D.   05/02/11 CXR to eval shortness of breath.  Increased pulmonary interstitial prominence. This could be partially due to AP portable technique and low lung volumes. Developing mild pulmonary venous congestion cannot be excluded.   MEDICATIONS: Scheduled Meds:    . acetaminophen  650 mg Oral Once  . anastrozole  1 mg Oral QHS  . bisacodyl  10 mg Oral Once  . buPROPion  300 mg Oral Daily  . diphenhydrAMINE  25 mg Intravenous Once  . furosemide  20 mg Intravenous Once  . nebivolol  10 mg Oral Daily  . pantoprazole (PROTONIX) IV  40 mg Intravenous Q12H  . polyethylene glycol-electrolytes  4,000 mL Oral Once  . potassium chloride  10 mEq Intravenous Q1 Hr x 4  . potassium chloride  10 mEq Intravenous Q1 Hr x 4  . potassium chloride  20 mEq Oral BID   Continuous Infusions:    . 0.9 % NaCl with KCl 20 mEq / L 20 mL/hr at 05/02/11 0531  . DISCONTD: sodium chloride 75 mL/hr at 05/01/11 0145   PRN Meds:.acetaminophen, acetaminophen, ALPRAZolam, alum & mag hydroxide-simeth, HYDROmorphone, ondansetron (ZOFRAN) IV, ondansetron, oxyCODONE, zolpidem, DISCONTD: ALPRAZolam  Antibiotics: Anti-infectives    None        Assessment/Plan: Patient Active Hospital Problem List: Anemia    Assessment: Microcytic and iron deficient. Highly suspicious for blood loss from her bowels. However her stools are brown, there is no history of overt blood loss. Stools are FOBT positive.    Plan: She received 3 Units of PRBCs and her Hgb responded appropriately.  Dr. Paulita Fujita will move forward with Egd/Colon on 05/03/11 as an inpatient.  GI bleed    Assessment: Suspicion for occult GI bleed. Given age and microcytosis colon cancer is a possibility.   Plan:  Please see above.  EGD/Colon scheduled inpatient on 05/03/11.. Continue with PPI for now, and check coags.  Weakness generalized/fatigue    Assessment: Likely secondary to symptomatic anemia. Possibly polypharmacy playing a role as well.  Plan: . Will consult psych to see if we can minimize some of her medications and treat anxiety.  Falls frequently    Assessment: Not sure if this is from her anemia or actually from polypharmacy (I suspect the latter) CT of the head is negative. Patient does not have any focal deficits on exam. Her speech is clear. .   Plan:  will need to minimize medications.  Will consult psych.she is also on chronic narcotics for chronic back pain-will see if that can be cut down as well   Hypokalemia    Assessment: This is secondary to Lasix. This has been persistent in spite of the patient getting 4 runs of 10 mEq of potassium early this morning.    Plan: Will give another 4 runs of 10 mEq of potassium today. We'll also place on oral potassium supplementation needed, check serum Magnesium & replete if needed.  HTN (hypertension)    Assessment: Currently stable.   Plan: Will resume bystolic, suspected blood loss is occult and not overt.   Anxiety and depression    Assessment: Ongoing anxiety, easily cries. On numerous psych meds at home including Xanax, Wellbutrin, Luvox and Seroquel.   Plan: We'll get psychiatry to see her and see if her  medications can be cut down and simplified.   Lower extremity edema    Assessment: ? Etiology. Urinalysis does not show proteinuria. LFTs are unremarkable.    Plan: Continue Lasix, we'll check a 2-D echo.  Shortness of breath   Assessment:  Patient taking shallow breaths.  No wheeze or crackles, nad.  Appears related to anxiety.  CXR shows:  Increased pulmonary interstitial prominence. This could be  partially due to AP portable technique and low lung volumes. Developing mild pulmonary venous congestion cannot be excluded.  Will not give additional lasix as patient is about to undergo a bowel prep and will have volume loss.   Disposition: Remain inpatient.   DVT Prophylaxis: SCDs.   Code Status: Full code.   [name], [place] 05/02/11 11:08 am [id number]

## 2011-05-02 NOTE — Progress Notes (Signed)
Clinical Social Work Psychiatry  Assessment    Presenting Symptoms/Problems: patient reports she feels terrible, has had multiple falls at home, reports she has history of panic attacks and depression due to medical condition.     Psychiatric History: Per patient report, she has had panic attacks for a long time and prescribed medication for attacks.  Currently follows in the outpatient and reports compliant with medications.  Spoke with patient's niece who reports this is accurate and patient has not been SI, HI.  Reports no past admission to mental health hospital.   Family Collateral Information: Niece Marzetta Board) reports she became patient's caregiver 8 months ago when patient began having multiple medical problems.  Reports she has seen a decline in patient in the last 2 1/2 months.  Reports patient has been having memory loss and visually hallucinating at her home.  Niece reports patient saw a pair of socks in the corner and thought it was a large animal, called neighbors to come over to get the animal out of the house.  Patient was re-oriented and very embarrassed per niece report.  Niece also reports she has been very depressed and has history of anxiety with panic attacks.  Reports she lives alone in [place] and also been having multiple falls causing facial bruising and swelling.     Reports she has two adult children but they are not very involved and niece is sole caregiver. Niece reports she is very exhausted with taking care of patient and worries about patient safety because she lives at home and her well being.    Patient niece can be reached at work: [phone])                        Emotional Health/Current Symptoms:     Suicide/Self harm: no reported and no past attempts or thoughts per patient report and niece.      Psychotic/Dissociative Symptoms: ?Visual hallucinations of animals.  No auditory per niece report. At the current time, patient denies VH.      Attention/Behavioral Symptoms: Upon assessment, patient lying in bed and reporting she is feeling very terrible.  Able to tell CSW where she is currently located and family (niece) who is her support.  Reports she is anxious and depressed.  Patient unable to tell CSW her medical problems, but is compliant currently with medical team and treatment.  Patient reports her face is swollen because she falls a lot.      Recent Loss/Stressor: Other than medical problems, no other stressors noted.      Substance Abuse/Use: none reported.       Disposition/Interpretive Summary:  Patient lives a lot at home, has a history of panic attacks and anxiety.  Has an outside PCP and Psych MD/therapist to help manage medication and treatment of anxiety.    Patient has multiple falls and would benefit from a PT/OT  Consult to determine needs at dc and caregiver is agreeable.  Discussed with caregiver the option of ST rehab and caregiver interested and think it would beneficial for patient.  Caregiver reports she feels patient will refuse, but if short term she may consider SNF.  Will follow up on patient and assist with dc needs/care.  Caleen Essex, MSW LCSW [phone]  [id number]

## 2011-05-02 NOTE — Progress Notes (Signed)
Patient was seen and examined twice today. Agree with assessment and plan as outlined by Ms. York. Her shortness of breath is probably anxiety related, a chest x-ray does show some mild pulmonary venous congestion. She also appears to be slightly more edematous compared to yesterday. However her lungs are clear to auscultation. Because she has received a lot of fluids and 3 units of PRBC, we'll go head and place her back on Lasix. We will give an additional 40 mg of potassium supplementation orally. We will recheck a chemistry panel this evening to make sure that her potassium is within acceptable limits. She is asking for her narcotics to be resumed, patient is on chronic narcotics for the past number of years primarily for chronic back pain issues. We will go and restart her narcotics but had a significantly lower dose of 40 mg twice daily.

## 2011-05-02 NOTE — Consult Note (Signed)
Patient Identification:  [name] Date of Evaluation:  05/02/2011   History of Present Illness:  65 year old Caucasian female reported depressed mood for quite a long time that she relates to not working having medical issues like 2 back surgeries. Patient reported poor sleep appetite fair feeling hopeless helpless again because of not working living by herself. She is not having active suicidal ideations but tverbalized "I don't feel that I should be here anymore. "I feel useless". Patient also reported panic attacks 2-3 times a day and worrying all the time.  Substance abuse history- patient denies abuse of alcohol or illicit drugs.  Past psych history -patient denied past psych hospitalization or history of suicide attempt. Patient is getting counseling in the outpatient setting. Patient is on Xanax Wellbutrin Luvox and Seroquel prescribed by the primary care physician.  Social history -she lives by herself  her son visits her regularly. She reported her daughter is busy with the her little girl, she don't see her quite often.   Mental Status Examination: 64 year old white female initially was guarded in providing the history but later on she started discussing her issues. No psychomotor agitation retardation present. Speech is soft slow. Mood depressed affect mood congruent thought process logical and goal-directed thought content no active suicidal or homicidal ideations present. Not hallucinating or delusional. No flight of ideas present. Cognition alert awake oriented x3. Memory immediate recent remote fair attention concentration fair abstract ability fair insight and judgment intact.   Past Medical History:     Past Medical History  Diagnosis Date  . Breast cancer   . Hypertension   . Depression   . Back pain, chronic        Past Surgical History  Procedure Date  . Back surgery   . Shoulder surgery   . Tumor removal Tumor removed R breast  . Lumbar disc surgery   .  Partial hysterectomy     Allergies: No Known Allergies  Current Medications:  Prior to Admission medications   Medication Sig Start Date End Date Taking? Authorizing Provider  ALPRAZolam Duanne Moron) 0.5 MG tablet Take 0.5 mg by mouth 3 (three) times daily as needed. For anxiety    Yes Historical Provider, MD  anastrozole (ARIMIDEX) 1 MG tablet Take 1 mg by mouth at bedtime.     Yes Historical Provider, MD  buPROPion (WELLBUTRIN XL) 300 MG 24 hr tablet Take 300 mg by mouth daily.     Yes Historical Provider, MD  fluvoxaMINE (LUVOX) 50 MG tablet Take 50-100 mg by mouth at bedtime. 1 tab in the am, 2 tabs in the pm    Yes Historical Provider, MD  furosemide (LASIX) 20 MG tablet Take 40 mg by mouth 2 (two) times daily.     Yes Historical Provider, MD  nebivolol (BYSTOLIC) 10 MG tablet Take 10 mg by mouth daily.     Yes Historical Provider, MD  Oxycodone HCl (OXYCONTIN) 60 MG TB12 Take 1 tablet by mouth 3 (three) times daily as needed. For pain    Yes Historical Provider, MD  QUEtiapine (SEROQUEL) 50 MG tablet Take 50 mg by mouth at bedtime.     Yes Historical Provider, MD    Social History:    reports that she has quit smoking. She does not have any smokeless tobacco history on file. She reports that she does not drink alcohol or use illicit drugs.   Family History:    Family History  Problem Relation Age of Onset  . Diabetes Mother   .  Hypertension Mother   . 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  AXIS IV  medical issues lives by herself   AXIS V 51     Recommendations:  #1 patient can be continued on her current medications #2 clinical social worker should call the son to get more collateral information #3 I will follow up on this patient as needed   Marlou Sa, MD

## 2011-05-02 NOTE — Progress Notes (Signed)
Subjective: Shortness-of-breath with exertion persists. No blood in stool.  Objective: Vital signs in last 24 hours: Temp:  [97.4 F (36.3 C)-98.5 F (36.9 C)] 97.8 F (36.6 C) (12/11 0450) Pulse Rate:  [62-98] 88  (12/11 0450) Resp:  [18-20] 18  (12/11 0450) BP: (148-179)/(53-87) 165/74 mmHg (12/11 0450) SpO2:  [92 %-95 %] 95 % (12/10 1745) Weight change:  Last BM Date: 05/01/11  PE: GEN:  Somewhat tachypneic-appearing (just returned from restroom) HEENT:  Pale-appearing conjunctiva + skin LUNGS:  Clear without crackles/rales ABD:  Soft, non-tender, active bowel sounds  Lab Results: Results for orders placed during the hospital encounter of 04/30/11 (from the past 48 hour(s))  URINALYSIS, ROUTINE W REFLEX MICROSCOPIC     Status: Normal   Collection Time   04/30/11  9:52 PM      Component Value Range Comment   Color, Urine YELLOW  YELLOW     APPearance CLEAR  CLEAR     Specific Gravity, Urine 1.013  1.005 - 1.030     pH 6.0  5.0 - 8.0     Glucose, UA NEGATIVE  NEGATIVE (mg/dL)    Hgb urine dipstick NEGATIVE  NEGATIVE     Bilirubin Urine NEGATIVE  NEGATIVE     Ketones, ur NEGATIVE  NEGATIVE (mg/dL)    Protein, ur NEGATIVE  NEGATIVE (mg/dL)    Urobilinogen, UA 0.2  0.0 - 1.0 (mg/dL)    Nitrite NEGATIVE  NEGATIVE     Leukocytes, UA NEGATIVE  NEGATIVE  MICROSCOPIC NOT DONE ON URINES WITH NEGATIVE PROTEIN, BLOOD, LEUKOCYTES, NITRITE, OR GLUCOSE <1000 mg/dL.  CBC     Status: Abnormal   Collection Time   04/30/11 10:46 PM      Component Value Range Comment   WBC 5.8  4.0 - 10.5 (K/uL)    RBC 3.09 (*) 3.87 - 5.11 (MIL/uL)    Hemoglobin 6.7 (*) 12.0 - 15.0 (g/dL)    HCT 21.8 (*) 36.0 - 46.0 (%)    MCV 70.6 (*) 78.0 - 100.0 (fL)    MCH 21.7 (*) 26.0 - 34.0 (pg)    MCHC 30.7  30.0 - 36.0 (g/dL)    RDW 15.0  11.5 - 15.5 (%)    Platelets 288  150 - 400 (K/uL)   DIFFERENTIAL     Status: Normal   Collection Time   04/30/11 10:46 PM      Component Value Range Comment   Neutrophils Relative 57  43 - 77 (%)    Neutro Abs 3.3  1.7 - 7.7 (K/uL)    Lymphocytes Relative 29  12 - 46 (%)    Lymphs Abs 1.7  0.7 - 4.0 (K/uL)    Monocytes Relative 12  3 - 12 (%)    Monocytes Absolute 0.7  0.1 - 1.0 (K/uL)    Eosinophils Relative 1  0 - 5 (%)    Eosinophils Absolute 0.1  0.0 - 0.7 (K/uL)    Basophils Relative 1  0 - 1 (%)    Basophils Absolute 0.0  0.0 - 0.1 (K/uL)   COMPREHENSIVE METABOLIC PANEL     Status: Abnormal   Collection Time   04/30/11 10:46 PM      Component Value Range Comment   Sodium 142  135 - 145 (mEq/L)    Potassium 2.8 (*) 3.5 - 5.1 (mEq/L)    Chloride 107  96 - 112 (mEq/L)    CO2 26  19 - 32 (mEq/L)    Glucose, Bld 82  70 -  99 (mg/dL)    BUN 30 (*) 6 - 23 (mg/dL)    Creatinine, Ser 1.06  0.50 - 1.10 (mg/dL)    Calcium 9.8  8.4 - 10.5 (mg/dL)    Total Protein 6.3  6.0 - 8.3 (g/dL)    Albumin 3.2 (*) 3.5 - 5.2 (g/dL)    AST 29  0 - 37 (U/L)    ALT 18  0 - 35 (U/L)    Alkaline Phosphatase 81  39 - 117 (U/L)    Total Bilirubin 0.2 (*) 0.3 - 1.2 (mg/dL)    GFR calc non Af Amer 54 (*) >90 (mL/min)    GFR calc Af Amer 63 (*) >90 (mL/min)   OCCULT BLOOD, POC DEVICE     Status: Normal   Collection Time   04/30/11 11:54 PM      Component Value Range Comment   Fecal Occult Bld POSITIVE     TYPE AND SCREEN     Status: Normal (Preliminary result)   Collection Time   05/01/11 12:10 AM      Component Value Range Comment   ABO/RH(D) O NEG      Antibody Screen NEG      Sample Expiration 05/04/2011      Unit Number [id number]      Blood Component Type RBC CPDA1, LR      Unit division 00      Status of Unit ISSUED      Transfusion Status OK TO TRANSFUSE      Crossmatch Result Compatible      Unit Number [id number]      Blood Component Type RED CELLS,LR      Unit division 00      Status of Unit ISSUED      Transfusion Status OK TO TRANSFUSE      Crossmatch Result Compatible      Unit Number [id number]      Blood Component Type RED CELLS,LR        Unit division 00      Status of Unit ISSUED      Transfusion Status OK TO TRANSFUSE      Crossmatch Result Compatible     ABO/RH     Status: Normal   Collection Time   05/01/11 12:10 AM      Component Value Range Comment   ABO/RH(D) O NEG     PREPARE RBC (CROSSMATCH)     Status: Normal   Collection Time   05/01/11  2:00 AM      Component Value Range Comment   Order Confirmation ORDER PROCESSED BY BLOOD BANK     BASIC METABOLIC PANEL     Status: Abnormal   Collection Time   05/01/11  6:05 AM      Component Value Range Comment   Sodium 139  135 - 145 (mEq/L)    Potassium 2.2 (*) 3.5 - 5.1 (mEq/L)    Chloride 106  96 - 112 (mEq/L)    CO2 25  19 - 32 (mEq/L)    Glucose, Bld 98  70 - 99 (mg/dL)    BUN 24 (*) 6 - 23 (mg/dL)    Creatinine, Ser 1.00  0.50 - 1.10 (mg/dL)    Calcium 9.0  8.4 - 10.5 (mg/dL)    GFR calc non Af Amer 58 (*) >90 (mL/min)    GFR calc Af Amer 67 (*) >90 (mL/min)   CBC     Status: Abnormal   Collection Time   05/01/11  6:05  AM      Component Value Range Comment   WBC 3.5 (*) 4.0 - 10.5 (K/uL)    RBC 2.98 (*) 3.87 - 5.11 (MIL/uL)    Hemoglobin 6.3 (*) 12.0 - 15.0 (g/dL) CRITICAL VALUE NOTED.  VALUE IS CONSISTENT WITH PREVIOUSLY REPORTED AND CALLED VALUE.   HCT 21.1 (*) 36.0 - 46.0 (%)    MCV 70.8 (*) 78.0 - 100.0 (fL)    MCH 21.1 (*) 26.0 - 34.0 (pg)    MCHC 29.9 (*) 30.0 - 36.0 (g/dL)    RDW 15.2  11.5 - 15.5 (%)    Platelets 243  150 - 400 (K/uL)   HEMOGLOBIN AND HEMATOCRIT, BLOOD     Status: Abnormal   Collection Time   05/01/11  9:17 AM      Component Value Range Comment   Hemoglobin 5.9 (*) 12.0 - 15.0 (g/dL) CRITICAL VALUE NOTED.  VALUE IS CONSISTENT WITH PREVIOUSLY REPORTED AND CALLED VALUE.   HCT 19.9 (*) 36.0 - 46.0 (%)   VITAMIN B12     Status: Abnormal   Collection Time   05/01/11  9:17 AM      Component Value Range Comment   Vitamin B-12 1095 (*) 211 - 911 (pg/mL)   FOLATE     Status: Normal   Collection Time   05/01/11  9:17 AM       Component Value Range Comment   Folate >20.0     IRON AND TIBC     Status: Abnormal   Collection Time   05/01/11  9:17 AM      Component Value Range Comment   Iron 12 (*) 42 - 135 (ug/dL)    TIBC 438  250 - 470 (ug/dL)    Saturation Ratios 3 (*) 20 - 55 (%)    UIBC 426 (*) 125 - 400 (ug/dL)   FERRITIN     Status: Normal   Collection Time   05/01/11  9:17 AM      Component Value Range Comment   Ferritin 12  10 - 291 (ng/mL)   RETICULOCYTES     Status: Abnormal   Collection Time   05/01/11  9:17 AM      Component Value Range Comment   Retic Ct Pct 1.9  0.4 - 3.1 (%)    RBC. 2.82 (*) 3.87 - 5.11 (MIL/uL)    Retic Count, Manual 53.6  19.0 - 186.0 (K/uL)   BASIC METABOLIC PANEL     Status: Abnormal   Collection Time   05/01/11  9:17 AM      Component Value Range Comment   Sodium 142  135 - 145 (mEq/L)    Potassium 2.2 (*) 3.5 - 5.1 (mEq/L)    Chloride 108  96 - 112 (mEq/L)    CO2 26  19 - 32 (mEq/L)    Glucose, Bld 102 (*) 70 - 99 (mg/dL)    BUN 21  6 - 23 (mg/dL)    Creatinine, Ser 0.96  0.50 - 1.10 (mg/dL)    Calcium 9.3  8.4 - 10.5 (mg/dL)    GFR calc non Af Amer 61 (*) >90 (mL/min)    GFR calc Af Amer 71 (*) >90 (mL/min)   PREPARE RBC (CROSSMATCH)     Status: Normal   Collection Time   05/01/11  3:30 PM      Component Value Range Comment   Order Confirmation ORDER PROCESSED BY BLOOD BANK     CBC     Status: Abnormal  Collection Time   05/02/11  6:20 AM      Component Value Range Comment   WBC 6.6  4.0 - 10.5 (K/uL)    RBC 4.67  3.87 - 5.11 (MIL/uL)    Hemoglobin 11.1 (*) 12.0 - 15.0 (g/dL) POST TRANSFUSION SPECIMEN   HCT 34.7 (*) 36.0 - 46.0 (%)    MCV 74.3 (*) 78.0 - 100.0 (fL)    MCH 23.8 (*) 26.0 - 34.0 (pg)    MCHC 32.0  30.0 - 36.0 (g/dL)    RDW 16.1 (*) 11.5 - 15.5 (%)    Platelets 244  150 - 400 (K/uL)   BASIC METABOLIC PANEL     Status: Abnormal   Collection Time   05/02/11  6:20 AM      Component Value Range Comment   Sodium 145  135 - 145 (mEq/L)     Potassium 2.7 (*) 3.5 - 5.1 (mEq/L)    Chloride 108  96 - 112 (mEq/L)    CO2 28  19 - 32 (mEq/L)    Glucose, Bld 88  70 - 99 (mg/dL)    BUN 11  6 - 23 (mg/dL)    Creatinine, Ser 0.87  0.50 - 1.10 (mg/dL)    Calcium 9.0  8.4 - 10.5 (mg/dL)    GFR calc non Af Amer 69 (*) >90 (mL/min)    GFR calc Af Amer 80 (*) >90 (mL/min)   CBC     Status: Abnormal   Collection Time   05/02/11  8:34 AM      Component Value Range Comment   WBC 7.3  4.0 - 10.5 (K/uL)    RBC 4.75  3.87 - 5.11 (MIL/uL)    Hemoglobin 11.5 (*) 12.0 - 15.0 (g/dL)    HCT 35.1 (*) 36.0 - 46.0 (%)    MCV 73.9 (*) 78.0 - 100.0 (fL)    MCH 24.2 (*) 26.0 - 34.0 (pg)    MCHC 32.8  30.0 - 36.0 (g/dL)    RDW 15.9 (*) 11.5 - 15.5 (%)    Platelets 253  150 - 400 (K/uL)     Studies/Results: Dg Chest 2 View  04/30/2011  *RADIOLOGY REPORT*  Clinical Data: Altered mental status.  Right-sided chest pain. History of breast cancer and radiation.  CHEST - 2 VIEW  Comparison: 03/20/2011  Findings: Normal heart size and pulmonary vascularity.  Pulmonary hyperinflation consistent with emphysema.  Scattered fibrosis in the lungs.  Old right rib fractures.  Surgical clips in the right chest.  Mild blunting of right costophrenic angle may suggest fluid or thickened pleura.  Degenerative changes in the thoracic spine. No focal airspace consolidation in the lungs.  No pneumothorax. Postoperative changes in both shoulders.  Postoperative changes in the lumbar spine.  IMPRESSION: Overall stable appearance of the chest since previous study. Emphysematous changes and scattered fibrosis.  Blunting of the right costophrenic angle suggesting fluid or thickened pleura.  Original Report Authenticated By: Neale Burly, M.D.   Ct Head Wo Contrast  04/30/2011  *RADIOLOGY REPORT*  Clinical Data: Altered mental status.  Syncope.  Abrasion to side the right eye, possibly from fall.  CT HEAD WITHOUT CONTRAST  Technique:  Contiguous axial images were obtained  from the base of the skull through the vertex without contrast.  Comparison: None.  Findings: Diffuse cerebral atrophy.  Ventricular dilatation consistent with central atrophy.  Low attenuation changes in the deep white matter suggesting small vessel ischemia.  Focal low attenuation changes in the basal ganglia  bilaterally consistent with old lacunar infarcts.  No mass effect or midline shift.  No abnormal extra-axial fluid collections.  The gray-white matter junctions are distinct.  The basal cisterns are not effaced.  No evidence of acute intracranial hemorrhage.  No depressed skull fractures.  Visualized paranasal sinuses and mastoid air cells are not opacified.  Calcification of the intracranial arteries.  IMPRESSION: Chronic atrophy and small vessel ischemic changes.  No evidence of acute intracranial hemorrhage, mass lesion, or acute infarct.  Original Report Authenticated By: Neale Burly, M.D.    Assessment:  1.  Iron-deficiency anemia. 2.  Hemoccult positive stool. 3.  Shortness of breath, persists after transfusion.  No obvious volume overload on exam.  CXR suggestive of emphysema.  Plan:  1.  Clear liquid diet only; NPO after midnight. 2.  PPI. 3.  Plan EGD + Colon tomorrow for further evaluation. 4.  Case discussed with Dr. Sloan Leiter, who will look into her respiratory status.   Landry Dyke 05/02/2011, 9:15 AM

## 2011-05-02 NOTE — Progress Notes (Signed)
Transfusion completed at 2357

## 2011-05-02 NOTE — Progress Notes (Signed)
  Echocardiogram 2D Echocardiogram has been performed.  Isaac Bliss 05/02/2011, 3:21 PM

## 2011-05-02 NOTE — Progress Notes (Signed)
Physical Therapy Evaluation Patient Details Name: [name] MRN: [name] DOB: 05/29/1945 Today's Date: 05/02/2011  Problem List:  Patient Active Problem List  Diagnoses  . Weakness generalized  . Anemia  . Falls frequently  . GI bleed  . Hypokalemia  . HTN (hypertension)  . Anxiety and depression  . Lower extremity edema    Past Medical History:  Past Medical History  Diagnosis Date  . Breast cancer   . Hypertension   . Depression   . Back pain, chronic    Past Surgical History:  Past Surgical History  Procedure Date  . Back surgery   . Shoulder surgery   . Tumor removal Tumor removed R breast  . Lumbar disc surgery   . Partial hysterectomy     PT Assessment/Plan/Recommendation PT Assessment Clinical Impression Statement: Pt requesting to go to bathroom upon entry. Pt requires min assist secondary to decreased dynamic stability with gait. Evaluation limited secondary to pt focusing on pain, RN made aware of pain levels at end of session. Discussed pt's need for potentially SNF placement given that she lives alone and has had multiple falls. Pt reports she will not go because she has to care for her dog. PT educated on risks and consequences of falling. Pt reports she will consider. Pt refused to stay up in chair today.  PT Recommendation/Assessment: Patient will need skilled PT in the acute care venue PT Problem List: Decreased strength;Decreased activity tolerance;Decreased balance;Decreased mobility;Decreased knowledge of use of DME;Decreased safety awareness;Pain Barriers to Discharge: Decreased caregiver support PT Therapy Diagnosis : Difficulty walking;Abnormality of gait;Generalized weakness;Acute pain (chronic pain) PT Plan PT Frequency: Min 3X/week PT Treatment/Interventions: DME instruction;Gait training;Stair training;Functional mobility training;Therapeutic activities;Therapeutic exercise;Balance training;Neuromuscular re-education;Patient/family  education PT Recommendation Recommendations for Other Services: OT consult (if not already ordered) Follow Up Recommendations: Skilled nursing facility Equipment Recommended:  (to be determined if pt refuses SNF) PT Goals  Acute Rehab PT Goals PT Goal Formulation: With patient Time For Goal Achievement: 2 weeks Pt will go Sit to Stand: with modified independence PT Goal: Sit to Stand - Progress: Progressing toward goal Pt will go Stand to Sit: with modified independence PT Goal: Stand to Sit - Progress: Progressing toward goal Pt will Ambulate: 51 - 150 feet;with modified independence;with least restrictive assistive device PT Goal: Ambulate - Progress: Progressing toward goal Pt will Go Up / Down Stairs: 3-5 stairs;with least restrictive assistive device;with modified independence PT Goal: Up/Down Stairs - Progress: Progressing toward goal  PT Evaluation Precautions/Restrictions  Precautions Precautions: Fall Precaution Comments: multiple h/o falls Prior Functioning  Home Living Lives With: Alone Receives Help From: Neighbor;Family Type of Home: House Home Layout: One level Home Access: Stairs to enter Entrance Stairs-Rails: Right Entrance Stairs-Number of Steps: 4, 7 Bathroom Shower/Tub:  (to be determined) Biochemist, clinical: Standard Home Adaptive Equipment: None Prior Function Level of Independence: Independent with basic ADLs Driving: No (Doesn't own a car) Vocation: Unemployed Comments: Was having increasing difficulty with mobility Cognition Cognition Arousal/Alertness: Awake/alert Overall Cognitive Status: Appears within functional limits for tasks assessed Cognition - Other Comments: Pain focused Sensation/Coordination Sensation Light Touch: Appears Intact Extremity Assessment RLE Assessment RLE Assessment: Exceptions to [facility] RLE AROM (degrees) Overall AROM Right Lower Extremity: Within functional limits for tasks assessed RLE Strength RLE Overall Strength:  Deficits RLE Overall Strength Comments: Generalized deconditioning, grossly >/= 3/5 LLE Assessment LLE Assessment: Exceptions to WFL LLE AROM (degrees) Overall AROM Left Lower Extremity: Within functional limits for tasks assessed LLE Strength LLE Overall  Strength: Deficits LLE Overall Strength Comments: Generalized deconditioning, grossly >/= 3/5   Mobility (including Balance) Bed Mobility Bed Mobility: Yes Supine to Sit: 6: Modified independent (Device/Increase time) (increased time) Sit to Supine - Right: 5: Supervision Sit to Supine - Right Details (indicate cue type and reason): Verbal cues for efficiency Transfers Transfers: Yes Sit to Stand: 4: Min assist Sit to Stand Details (indicate cue type and reason): assist secondary to decr. stability  Stand to Sit: 4: Min assist Stand to Sit Details: Assist secondary to decreased stability, increased sway.  Ambulation/Gait Ambulation/Gait: Yes Ambulation/Gait Assistance: 4: Min assist Ambulation/Gait Assistance Details (indicate cue type and reason): Assist for decreased dynamic stability, increased lateral sway.  Ambulation Distance (Feet): 40 Feet (20' then 20') Assistive device: None (IV intermittant) Gait Pattern: Decreased stride length;Shuffle (varrying stance time)  Posture/Postural Control Posture/Postural Control: Postural limitations Postural Limitations: Forward trunk flexion secondary to pain? Balance Balance Assessed: Yes Dynamic Standing Balance Dynamic Standing - Balance Support: No upper extremity supported Dynamic Standing - Level of Assistance: 4: Min assist Dynamic Standing - Balance Activities: Reaching for objects;Reaching across midline Dynamic Standing - Comments: Pt unsteady with dynamic balance activities.  End of Session PT - End of Session Equipment Utilized During Treatment:  (pt refused gait belt) Activity Tolerance: Patient limited by fatigue;Patient limited by pain Patient left: in chair;with  call bell in reach (RN present) Nurse Communication: Mobility status for transfers;Mobility status for ambulation (elevated BP) General Behavior During Session:  (Pain focused. ) Cognition: WFL for tasks performed  Orlean Bradford 05/02/2011, 2:29 PM

## 2011-05-03 ENCOUNTER — Encounter (HOSPITAL_COMMUNITY): Payer: Self-pay | Admitting: *Deleted

## 2011-05-03 ENCOUNTER — Other Ambulatory Visit: Payer: Self-pay | Admitting: Gastroenterology

## 2011-05-03 ENCOUNTER — Inpatient Hospital Stay (HOSPITAL_COMMUNITY): Payer: PRIVATE HEALTH INSURANCE

## 2011-05-03 ENCOUNTER — Encounter (HOSPITAL_COMMUNITY)
Admission: EM | Disposition: A | Discharge status: Home / Self Care | Payer: Self-pay | Source: Home / Self Care | Attending: Internal Medicine

## 2011-05-03 DIAGNOSIS — Z87891 Personal history of nicotine dependence: Secondary | ICD-10-CM

## 2011-05-03 DIAGNOSIS — D490 Neoplasm of unspecified behavior of digestive system: Secondary | ICD-10-CM

## 2011-05-03 DIAGNOSIS — K6389 Other specified diseases of intestine: Secondary | ICD-10-CM | POA: Diagnosis present

## 2011-05-03 HISTORY — PX: COLONOSCOPY: SHX5424

## 2011-05-03 HISTORY — DX: Personal history of nicotine dependence: Z87.891

## 2011-05-03 LAB — TYPE AND SCREEN
ABO/RH(D): O NEG
Antibody Screen: NEGATIVE
Unit division: 0
Unit division: 0

## 2011-05-03 LAB — CBC
Platelets: 274 10*3/uL (ref 150–400)
RDW: 16.6 % — ABNORMAL HIGH (ref 11.5–15.5)
WBC: 6.9 10*3/uL (ref 4.0–10.5)

## 2011-05-03 LAB — BASIC METABOLIC PANEL
Chloride: 103 mEq/L (ref 96–112)
GFR calc Af Amer: 67 mL/min — ABNORMAL LOW (ref >90)
Potassium: 3.1 mEq/L — ABNORMAL LOW (ref 3.5–5.1)

## 2011-05-03 SURGERY — COLONOSCOPY
Anesthesia: Moderate Sedation

## 2011-05-03 MED ORDER — FENTANYL NICU IV SYRINGE 50 MCG/ML
INJECTION | INTRAMUSCULAR | Status: DC | PRN
  Administered 2011-05-03 (×4): 25 ug via INTRAVENOUS

## 2011-05-03 MED ORDER — SPOT INK MARKER SYRINGE KIT
PACK | SUBMUCOSAL | Status: AC
  Filled 2011-05-03: qty 5

## 2011-05-03 MED ORDER — IOHEXOL 300 MG/ML  SOLN
100.0000 mL | Freq: Once | INTRAMUSCULAR | Status: AC | PRN
  Administered 2011-05-03: 100 mL via INTRAVENOUS

## 2011-05-03 MED ORDER — POTASSIUM CHLORIDE CRYS ER 20 MEQ PO TBCR
20.0000 meq | EXTENDED_RELEASE_TABLET | Freq: Two times a day (BID) | ORAL | Status: DC
  Administered 2011-05-03 – 2011-05-04 (×3): 20 meq via ORAL
  Filled 2011-05-03 (×2): qty 1

## 2011-05-03 MED ORDER — ENSURE IMMUNE HEALTH PO LIQD
237.0000 mL | Freq: Three times a day (TID) | ORAL | Status: DC
  Administered 2011-05-04: 237 mL via ORAL

## 2011-05-03 MED ORDER — MIDAZOLAM HCL 10 MG/2ML IJ SOLN
INTRAMUSCULAR | Status: DC | PRN
  Administered 2011-05-03: 1 mg via INTRAVENOUS
  Administered 2011-05-03 (×3): 2.5 mg via INTRAVENOUS

## 2011-05-03 MED ORDER — SPOT INK MARKER SYRINGE KIT
PACK | SUBMUCOSAL | Status: DC | PRN
  Administered 2011-05-03: 4.5 mL via SUBMUCOSAL

## 2011-05-03 MED ORDER — MIDAZOLAM HCL 10 MG/2ML IJ SOLN
INTRAMUSCULAR | Status: AC
  Filled 2011-05-03: qty 4

## 2011-05-03 MED ORDER — SODIUM CHLORIDE 0.9 % IV SOLN
Freq: Once | INTRAVENOUS | Status: AC
  Administered 2011-05-03: 500 mL via INTRAVENOUS

## 2011-05-03 MED ORDER — FENTANYL CITRATE 0.05 MG/ML IJ SOLN
INTRAMUSCULAR | Status: AC
  Filled 2011-05-03: qty 4

## 2011-05-03 NOTE — Consult Note (Signed)
Reason for consult: Colon Cancer Consulting MD: Dr. 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  ER/PR positive, HER-2 negative. She is  s/p XRT 12/28/2010 to 01/18/2011 to the R breast,,42.72 Gy at 2.67 Gy per fraction x16 fractions. She was placed on single-agent Arimidex 1 mg daily on 02/06/2011.  She was to follow up about 2 months thereafter at our office. Pt presented to the ED on 05/01/2011 with increasing generalized weakness, SOB, and eventual faint episodes. CT head 12/10 negative for acute findings. Labs were remarkable for a Hb 6.7 for which she received 3 units of PRBs. She was found to have Iron deficiency as well.  FOB was positive.  GI consults was obtained (Dr.Outlaw) . Colonoscopy performed 12/12 (Dr. Watt Climes). which revealed a near obstructing colon mass worrisome for cancer probably at hepatic flexure status post biopsy x2 . A small sigmoid polyp cold biopsied as well. Surgery consult is being obtained. CEA pending. We were informed of the patient's admission. Appreciate all care provided to [name].    PMH: Past Medical History  Diagnosis Date  . Breast cancer   . Hypertension   . Depression   . Back pain, chronic     Surgeries: Past Surgical History  Procedure Date  . Back surgery   . Shoulder surgery   . Tumor removal Tumor removed R breast  . Lumbar disc surgery   . Partial hysterectomy     Allergies: No Known Allergies  Medications:   Scheduled:   . sodium chloride   Intravenous Once  . sodium chloride   Intravenous Once  . anastrozole  1 mg Oral QHS  . bisacodyl  10 mg Oral Once  . buPROPion  300 mg Oral Daily  . furosemide  40 mg Oral Daily  . nebivolol  10 mg Oral Daily  . oxyCODONE  40 mg Oral Q12H  . pantoprazole (PROTONIX) IV  40 mg Intravenous  Q12H  . polyethylene glycol-electrolytes  4,000 mL Oral Once  . potassium chloride  10 mEq Intravenous Q1 Hr x 4  . potassium chloride  20 mEq Oral BID  . potassium chloride  20 mEq Oral BID  . potassium chloride  40 mEq Oral Once   [id number], acetaminophen, ALPRAZolam, alum & mag hydroxide-simeth, HYDROmorphone, ondansetron (ZOFRAN) IV, ondansetron, oxyCODONE, zolpidem, DISCONTD: fentaNYL, DISCONTD: midazolam, DISCONTD: spot ink marker  [id number] of Systems  Constitutional: Positive for weight loss. Negative for fever, chills  . Positive for malaise/fatigue.  Eyes: Negative for blurred vision and double vision. R eyebrow area tender after a fall causing some bruising Respiratory: Negative for cough, hemoptysis and positive for shortness of breath and DOE.  Cardiovascular: Negative for chest pain. GI: No nausea, vomiting, diarrhea, constipation. No change in bowel caliber. No  Melena or  Hematochezia at the macroscopic level.  GU: No blood in urine. No loss of urinary control. Skin: Negative for itching. No rash. No petechia. No bruising. Neurological: No headaches. No motor or sensory deficits. Msk: Chronic back pain. Falls frequently for the last 2 or 3 weeks Psych: very anxious. Having "panic attacks" frequently during this hospitalization evaluated by specialty  Family History: Family History  Problem Relation Age of Onset  . Diabetes Mother   . Hypertension Mother   . Cancer Mother   . Anesthesia problems Neg Hx   . Hypotension  Neg Hx   . Malignant hyperthermia Neg Hx   . Pseudochol deficiency Neg Hx     Social History:  reports that she has quit smoking. She does not have any smokeless tobacco history on file. She reports that she does not drink alcohol or use illicit drugs.  Physical Exam Unable to perform. Patient has requested to be alone,  Refuses exam at this time, she is having a "panic attack" and "need medication now". Pt is very tearful and "feel lost"     Labs  CBC  Lab 05/03/11 0522 05/02/11 0834 05/02/11 0620 05/01/11 0917 05/01/11 0605 04/30/11 2246  WBC 6.9 7.3 6.6 -- 3.5* 5.8  HGB 11.5* 11.5* 11.1* 5.9* 6.3* --  HCT 36.8 35.1* 34.7* 19.9* 21.1* --  PLT 274 253 244 -- 243 288  MCV 75.1* 73.9* 74.3* -- 70.8* 70.6*  MCH 23.5* 24.2* 23.8* -- 21.1* 21.7*  MCHC 31.3 32.8 32.0 -- 29.9* 30.7  RDW 16.6* 15.9* 16.1* -- 15.2 15.0  LYMPHSABS -- -- -- -- -- 1.7  MONOABS -- -- -- -- -- 0.7  EOSABS -- -- -- -- -- 0.1  BASOSABS -- -- -- -- -- 0.0  BANDABS -- -- -- -- -- --    CMP    Lab 05/03/11 0522 05/02/11 1847 05/02/11 1145 05/02/11 0620 05/01/11 0917 05/01/11 0605 04/30/11 2246  NA 144 145 -- 145 142 139 --  K 3.1* 3.5 -- 2.7* 2.2* 2.2* --  CL 103 104 -- 108 108 106 --  CO2 30 30 -- 28 26 25  --  GLUCOSE 98 92 -- 88 102* 98 --  BUN 9 7 -- 11 21 24* --  CREATININE 1.01 0.76 -- 0.87 0.96 1.00 --  CALCIUM 9.7 10.0 -- 9.0 9.3 9.0 --  MG -- -- 1.7 -- -- -- --  AST -- -- -- -- -- -- 29  ALT -- -- -- -- -- -- 18  ALKPHOS -- -- -- -- -- -- 81  BILITOT -- -- -- -- -- -- 0.2*        Component Value Date/Time   BILITOT 0.2* 04/30/2011 2246    Anemia panel:  Basename 05/01/11 0917  VITAMINB12 1095*  FOLATE >20.0  FERRITIN 12  TIBC 438  IRON 12*  RETICCTPCT 1.9    No results found for this basename: TSH,T4TOTAL,FREET3,T3FREE,THYROIDAB in the last 72 hours   No results found for this basename: esrsedrate     Lab 05/02/11 1145  INR 1.02  PROTIME --    No results found for this basename: DDIMER:2 in the last 72 hours  Imaging Studies: Dg Chest Port 1 View  05/02/2011  *RADIOLOGY REPORT*  Clinical Data: Shortness of breath.  Right-sided chest pain and upper back pain.  Cough.  Hypertension and breast cancer.  Ex- smoker.  PORTABLE CHEST - 1 VIEW  Comparison: 04/30/2011  Findings: Remote right rib trauma.  Bilateral shoulder arthroplasties.  Surgical clips in the right breast or low right axilla.  Normal heart size.  The  previously described right costophrenic angle blunting is no longer identified. No pneumothorax.  The pulmonary interstitium is increasingly prominent. No lobar consolidation.  IMPRESSION: Increased pulmonary interstitial prominence.  This could be partially due to AP portable technique and low lung volumes. Developing mild pulmonary venous congestion cannot be excluded.  Likely resolved trace right pleural fluid.  Original Report Authenticated By: Areta Haber, M.D.        A/P: 65 y.o. female  h/o T1 N0  invasive ductal carcinoma of the right breast,s/p  Right lumpectomy and sentinel lymph node biopsy on 10/10/2010 revealing low-grade invasive ductal carcinoma, 0/2 lymph nodes positive.  ER/PR positive, HER-2 negative. She is  s/p XRT 12/28/2010 to 01/18/2011 to the R breast and now on single-agent Arimidex. Dr.  Humphrey Rolls has been kindly informed of the patient's admission. Will await for the CT A/P report, as well as the path report from colonoscopy on 12/12. In addition, will review the plans by surgery and CEA levels. Once this information becomes available, will proceed with recommendations regarding her care, treatment options and further workup studies.  Thank you for the referral. Appreciate all the care provided to [name].  [facility] E 05/03/2011 3:26 PM         Patient seen and examined on 12/14 while she was in OR holding. I will follow up with her as outpatient. Wait for final pathology before making any recommendations  [name], MD [facility] [phone] (beeper) [phone] (Office)

## 2011-05-03 NOTE — Consult Note (Signed)
Reason for Consult:COLON MASS Referring Physician: Magod  [name] is an 65 y.o. female.  HPI: Patient is a 65 year old female who presents to the ER at the urging of her niece, for recurrent episodes of syncope patient reports 6-8 episodes and describes the last one where she woke up in the kitchen of her home with the door open, and no recollection of the event. Upon presentation to the emergency room at [facility] she was found to have a hemoglobin of 6.3 hematocrit of 21. CT scan of the head showed no acute changes. GI consult obtained and today the patient underwent colonoscopy which shows a near obstructing mass probably at the hepatic flexure. Biopsy and [place] ink injection were performed. Currently pathology is pending. CT scan of the abdomen pelvis and chest are being obtained. We were asked to see in consultation for resection of colon mass. Currently patient is in the mist of a panic attack having nausea and vomiting with contrast. She has been seen during this admission by psychiatry for depression and anxiety.  Past Medical History  Diagnosis Date  . Breast cancer   . Hypertension   . Depression   . Back pain, chronic    Followed for chronic pain by [facility] pain management by Dr. Hardin Negus. Seen in Hospital DR.Ahluwali, Psyc.  Recent  radiation therapy for breast cancer. Patient Active Problem List  Diagnoses  . Weakness generalized  . Anemia  . Falls frequently  . GI bleed  . Hypokalemia  . HTN (hypertension)  . Anxiety and depression  . Lower extremity edema  . Colonic mass  . Tobacco user   Past Surgical History  Procedure Date  . Back surgery   . Shoulder surgery   . Tumor removal Tumor removed R breast  . Lumbar disc surgery   . Partial hysterectomy    Right breast cancer with partial mastectomy axillary lymph node mapping 10/10/2010 Dr. Gershon Mussel Cornett Left shoulder surgery 01/2005 left shoulder replacement 11/09. Right shoulder replacement  07/23/2008. Left carpal tunnel release 6/08 Family History  Problem Relation Age of Onset  . Diabetes Mother   . Hypertension Mother   . Cancer Mother   . Anesthesia problems Neg Hx   . Hypotension Neg Hx   . Malignant hyperthermia Neg Hx   . Pseudochol deficiency Neg Hx     Social History:  reports that she has quit smoking. She does not have any smokeless tobacco history on file. She reports that she does not drink alcohol or use illicit drugs.  Allergies: No Known Allergies  Medications:  Prior to Admission:  Prescriptions prior to admission  Medication Sig Dispense Refill  . ALPRAZolam (XANAX) 0.5 MG tablet Take 0.5 mg by mouth 3 (three) times daily as needed. For anxiety       . anastrozole (ARIMIDEX) 1 MG tablet Take 1 mg by mouth at bedtime.        Marland Kitchen buPROPion (WELLBUTRIN XL) 300 MG 24 hr tablet Take 300 mg by mouth daily.        . fluvoxaMINE (LUVOX) 50 MG tablet Take 50-100 mg by mouth at bedtime. 1 tab in the am, 2 tabs in the pm       . furosemide (LASIX) 20 MG tablet Take 40 mg by mouth 2 (two) times daily.        . nebivolol (BYSTOLIC) 10 MG tablet Take 10 mg by mouth daily.        . Oxycodone HCl (OXYCONTIN) 60 MG  TB12 Take 1 tablet by mouth 3 (three) times daily as needed. For pain       . QUEtiapine (SEROQUEL) 50 MG tablet Take 50 mg by mouth at bedtime.         Scheduled:    . sodium chloride   Intravenous Once  . sodium chloride   Intravenous Once  . anastrozole  1 mg Oral QHS  . bisacodyl  10 mg Oral Once  . buPROPion  300 mg Oral Daily  . feeding supplement  237 mL Oral TID WC  . furosemide  40 mg Oral Daily  . nebivolol  10 mg Oral Daily  . oxyCODONE  40 mg Oral Q12H  . pantoprazole (PROTONIX) IV  40 mg Intravenous Q12H  . potassium chloride  20 mEq Oral BID  . potassium chloride  20 mEq Oral BID  . potassium chloride  40 mEq Oral Once   Continuous:    . 0.9 % NaCl with KCl 20 mEq / L Stopped (05/02/11 1859)   [id number], acetaminophen,  ALPRAZolam, alum & mag hydroxide-simeth, HYDROmorphone, iohexol, ondansetron (ZOFRAN) IV, ondansetron, oxyCODONE, zolpidem, DISCONTD: fentaNYL, DISCONTD: midazolam, DISCONTD: spot ink marker  Results for orders placed during the hospital encounter of 04/30/11 (from the past 48 hour(s))  CBC     Status: Abnormal   Collection Time   05/02/11  6:20 AM      Component Value Range Comment   WBC 6.6  4.0 - 10.5 (K/uL)    RBC 4.67  3.87 - 5.11 (MIL/uL)    Hemoglobin 11.1 (*) 12.0 - 15.0 (g/dL) POST TRANSFUSION SPECIMEN   HCT 34.7 (*) 36.0 - 46.0 (%)    MCV 74.3 (*) 78.0 - 100.0 (fL)    MCH 23.8 (*) 26.0 - 34.0 (pg)    MCHC 32.0  30.0 - 36.0 (g/dL)    RDW 16.1 (*) 11.5 - 15.5 (%)    Platelets 244  150 - 400 (K/uL)   BASIC METABOLIC PANEL     Status: Abnormal   Collection Time   05/02/11  6:20 AM      Component Value Range Comment   Sodium 145  135 - 145 (mEq/L)    Potassium 2.7 (*) 3.5 - 5.1 (mEq/L)    Chloride 108  96 - 112 (mEq/L)    CO2 28  19 - 32 (mEq/L)    Glucose, Bld 88  70 - 99 (mg/dL)    BUN 11  6 - 23 (mg/dL)    Creatinine, Ser 0.87  0.50 - 1.10 (mg/dL)    Calcium 9.0  8.4 - 10.5 (mg/dL)    GFR calc non Af Amer 69 (*) >90 (mL/min)    GFR calc Af Amer 80 (*) >90 (mL/min)   CBC     Status: Abnormal   Collection Time   05/02/11  8:34 AM      Component Value Range Comment   WBC 7.3  4.0 - 10.5 (K/uL)    RBC 4.75  3.87 - 5.11 (MIL/uL)    Hemoglobin 11.5 (*) 12.0 - 15.0 (g/dL)    HCT 35.1 (*) 36.0 - 46.0 (%)    MCV 73.9 (*) 78.0 - 100.0 (fL)    MCH 24.2 (*) 26.0 - 34.0 (pg)    MCHC 32.8  30.0 - 36.0 (g/dL)    RDW 15.9 (*) 11.5 - 15.5 (%)    Platelets 253  150 - 400 (K/uL)   PRO B NATRIURETIC PEPTIDE     Status: Abnormal   Collection  Time   05/02/11 11:45 AM      Component Value Range Comment   Pro B Natriuretic peptide (BNP) 3468.0 (*) 0 - 125 (pg/mL)   MAGNESIUM     Status: Normal   Collection Time   05/02/11 11:45 AM      Component Value Range Comment   Magnesium 1.7   1.5 - 2.5 (mg/dL)   PROTIME-INR     Status: Normal   Collection Time   05/02/11 11:45 AM      Component Value Range Comment   Prothrombin Time 13.6  11.6 - 15.2 (seconds)    INR 1.02  0.00 - 1.49    APTT     Status: Normal   Collection Time   05/02/11 11:45 AM      Component Value Range Comment   aPTT 29  24 - 37 (seconds)   BASIC METABOLIC PANEL     Status: Abnormal   Collection Time   05/02/11  6:47 PM      Component Value Range Comment   Sodium 145  135 - 145 (mEq/L)    Potassium 3.5  3.5 - 5.1 (mEq/L) DELTA CHECK NOTED   Chloride 104  96 - 112 (mEq/L)    CO2 30  19 - 32 (mEq/L)    Glucose, Bld 92  70 - 99 (mg/dL)    BUN 7  6 - 23 (mg/dL)    Creatinine, Ser 0.76  0.50 - 1.10 (mg/dL)    Calcium 10.0  8.4 - 10.5 (mg/dL)    GFR calc non Af Amer 87 (*) >90 (mL/min)    GFR calc Af Amer >90  >90 (mL/min)   CBC     Status: Abnormal   Collection Time   05/03/11  5:22 AM      Component Value Range Comment   WBC 6.9  4.0 - 10.5 (K/uL)    RBC 4.90  3.87 - 5.11 (MIL/uL)    Hemoglobin 11.5 (*) 12.0 - 15.0 (g/dL)    HCT 36.8  36.0 - 46.0 (%)    MCV 75.1 (*) 78.0 - 100.0 (fL)    MCH 23.5 (*) 26.0 - 34.0 (pg)    MCHC 31.3  30.0 - 36.0 (g/dL)    RDW 16.6 (*) 11.5 - 15.5 (%)    Platelets 274  150 - 400 (K/uL)   BASIC METABOLIC PANEL     Status: Abnormal   Collection Time   05/03/11  5:22 AM      Component Value Range Comment   Sodium 144  135 - 145 (mEq/L)    Potassium 3.1 (*) 3.5 - 5.1 (mEq/L)    Chloride 103  96 - 112 (mEq/L)    CO2 30  19 - 32 (mEq/L)    Glucose, Bld 98  70 - 99 (mg/dL)    BUN 9  6 - 23 (mg/dL)    Creatinine, Ser 1.01  0.50 - 1.10 (mg/dL)    Calcium 9.7  8.4 - 10.5 (mg/dL)    GFR calc non Af Amer 58 (*) >90 (mL/min)    GFR calc Af Amer 67 (*) >90 (mL/min)     Ct Chest W Contrast  05/03/2011  *RADIOLOGY REPORT*  Clinical Data:  Anemia, weight loss, abdominal pain, colon cancer, evaluate for metastases.  History of breast cancer.  CT CHEST, ABDOMEN AND  PELVIS WITH CONTRAST  Technique:  Multidetector CT imaging of the chest, abdomen and pelvis was performed following the standard protocol during bolus administration of intravenous contrast.  Contrast: 163mL OMNIPAQUE IOHEXOL 300 MG/ML IV SOLN  Comparison:  None  CT CHEST  Findings:  Mild centrilobular emphysematous changes.  Mild subpleural reticulation in the anterior right upper lobe, likely reflecting radiation changes.  Small right pleural effusion with associated lower lobe atelectasis.  The heart is normal in size.  No pericardial effusion.  Coronary atherosclerosis.  Atherosclerotic calcifications of the aortic arch.  No suspicious mediastinal, hilar, or axillary lymphadenopathy.  Postsurgical changes in the lateral right breast.  Overlying skin thickening, likely reflecting prior radiation.  Moderate degenerative changes of the thoracic spine.  Old right posterolateral rib deformities.  Bilateral shoulder arthroplasties. No focal osseous lesions.  IMPRESSION: Postsurgical changes in the lateral right breast.  Postradiation changes in the right breast and anterior right upper lobe.  No evidence of recurrent or metastatic disease in the chest.  Mild centrilobular emphysematous changes.  CT ABDOMEN AND PELVIS  Findings:  Tiny hypoenhancing lesion in the right hepatic lobe (series 2/image 51), too small to characterize but measuring simple fluid density and likely benign.  Spleen, pancreas, and adrenal glands are within normal limits.  Gallbladder is unremarkable.  No intrahepatic or extrahepatic ductal dilatation.  Kidneys are unremarkable.  No hydronephrosis.  No evidence of bowel obstruction.  Normal appendix.  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  Mild wall thickening involving a loop of sigmoid colon (series 2/image 101), possibly reflecting underdistension versus muscular hypertrophy  secondary to diverticulosis.  No suspicious abdominopelvic lymphadenopathy.  Atherosclerotic calcifications of the abdominal aorta and branch vessels.  Small volume pelvic ascites.  Status post hysterectomy.  No adnexal masses.  Moderate degenerative changes of the lumbar spine.  Status post PLIF at L2-3 and L4-5.  No focal osseous lesions.  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  No evidence of metastatic disease in the abdomen/pelvis.  Additional ancillary findings as above.  Original Report Authenticated By: Julian Hy, M.D.   Ct Abdomen Pelvis W Contrast  05/03/2011  *RADIOLOGY REPORT*  Clinical Data:  Anemia, weight loss, abdominal pain, colon cancer, evaluate for metastases.  History of breast cancer.  CT CHEST, ABDOMEN AND PELVIS WITH CONTRAST  Technique:  Multidetector CT imaging of the chest, abdomen and pelvis was performed following the standard protocol during bolus administration of intravenous contrast.  Contrast: 183mL OMNIPAQUE IOHEXOL 300 MG/ML IV SOLN  Comparison:  None  CT CHEST  Findings:  Mild centrilobular emphysematous changes.  Mild subpleural reticulation in the anterior right upper lobe, likely reflecting radiation changes.  Small right pleural effusion with associated lower lobe atelectasis.  The heart is normal in size.  No pericardial effusion.  Coronary atherosclerosis.  Atherosclerotic calcifications of the aortic arch.  No suspicious mediastinal, hilar, or axillary lymphadenopathy.  Postsurgical changes in the lateral right breast.  Overlying skin thickening, likely reflecting prior radiation.  Moderate degenerative changes of the thoracic spine.  Old right posterolateral rib deformities.  Bilateral shoulder arthroplasties. No focal osseous lesions.  IMPRESSION: Postsurgical changes in the lateral right breast.  Postradiation changes in the right breast and anterior right upper lobe.   No evidence of recurrent or metastatic disease in the chest.  Mild centrilobular emphysematous changes.  CT ABDOMEN AND PELVIS  Findings:  Tiny hypoenhancing lesion in the right hepatic lobe (series 2/image 51), too small to characterize but measuring simple fluid density and likely benign.  Spleen, pancreas, and adrenal glands are within normal limits.  Gallbladder is unremarkable.  No intrahepatic or extrahepatic ductal dilatation.  Kidneys are unremarkable.  No hydronephrosis.  No evidence of bowel obstruction.  Normal appendix.  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  Mild wall thickening involving a loop of sigmoid colon (series 2/image 101), possibly reflecting underdistension versus muscular hypertrophy secondary to diverticulosis.  No suspicious abdominopelvic lymphadenopathy.  Atherosclerotic calcifications of the abdominal aorta and branch vessels.  Small volume pelvic ascites.  Status post hysterectomy.  No adnexal masses.  Moderate degenerative changes of the lumbar spine.  Status post PLIF at L2-3 and L4-5.  No focal osseous lesions.  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  No evidence of metastatic disease in the abdomen/pelvis.  Additional ancillary findings as above.  Original Report Authenticated By: Julian Hy, M.D.   Dg Chest Port 1 View  05/02/2011  *RADIOLOGY REPORT*  Clinical Data: Shortness of breath.  Right-sided chest pain and upper back pain.  Cough.  Hypertension and breast cancer.  Ex- smoker.  PORTABLE CHEST - 1 VIEW  Comparison: 04/30/2011  Findings: Remote right rib trauma.  Bilateral shoulder arthroplasties.  Surgical clips in the right breast or low right axilla.  Normal heart size.  The previously described right costophrenic angle blunting is no longer identified.  No pneumothorax.  The pulmonary interstitium is increasingly prominent. No lobar consolidation.  IMPRESSION: Increased pulmonary interstitial prominence.  This could be partially due to AP portable technique and low lung volumes. Developing mild pulmonary venous congestion cannot be excluded.  Likely resolved trace right pleural fluid.  Original Report Authenticated By: Areta Haber, M.D.    Review of Systems  Constitutional: Positive for weight loss and malaise/fatigue.       Also c/o sleep disorder  HENT: Negative.   Eyes: Negative.   Respiratory: Negative.   Cardiovascular: Negative.   Gastrointestinal: Positive for blood in stool (on rectal exam in ER).  Genitourinary: Negative.   Musculoskeletal: Positive for back pain (back pain seen in Chronic pain clinic) and falls (6-8 falls/syncopal episodes in past few weeks).  Skin: Negative.   Neurological: Positive for weakness.  Psychiatric/Behavioral: Positive for depression. Negative for suicidal ideas, hallucinations and substance abuse. The patient is nervous/anxious and has insomnia.        Frequent panic attacks   Blood pressure 146/76, pulse 55, temperature 97.2 F (36.2 C), temperature source Oral, resp. rate 18, height 5\' 4"  (1.626 m), weight 70.308 kg (155 lb), SpO2 95.00%. Physical Exam  Constitutional: She is oriented to person, place, and time. She appears well-developed and well-nourished.       I saw the pt on 3 occasions, she was having a panic attack along with nausea during the 2nd visit.  HENT:  Head: Normocephalic and atraumatic.  Eyes: Conjunctivae and EOM are normal. Pupils are equal, round, and reactive to light.  Neck: Normal range of motion. Neck supple. No JVD present. No tracheal deviation present. No thyromegaly present.  Cardiovascular: Normal rate, regular rhythm, normal heart sounds and intact distal pulses.  Exam reveals no friction rub.   No murmur heard. Respiratory: Effort normal and breath sounds  normal. No respiratory distress. She has no wheezes. She has no rales. She exhibits no tenderness.  GI: Soft. Bowel sounds are normal. She exhibits no distension and no mass.  There is no tenderness. There is no rebound and no guarding.       Bilateral femoral bruits  Musculoskeletal: She exhibits edema (trace edema both lower extremities) and tenderness (both lower extremities tender to palpation).  Lymphadenopathy:    She has no cervical adenopathy.  Neurological: She is alert and oriented to person, place, and time. No cranial nerve deficit. Coordination normal.  Skin: Skin is warm and dry.  Psychiatric:       Pt. Reports depression and anxiety. Worse with news of possible new cancer.    Assessment/Plan: 1. Transverse/hepatic flexure mass near obstructing. 2. Recent right breast cancer with partial mastectomy 10/10/2010/chemotherapy and radiation therapy. 3. Anxiety/depression/panic attacks 4. Chronic back pain. 5. Hypertension 6. Anemia secondary to GI blood loss. Plan: Patient's undergoing CT scan for staging. She has undergone a prep so we will recommend keep her on clear liquids at this time. Continue therapy for depression and anxiety. Will await biopsy result and tentatively schedule for surgery later in the week. Will [facility] physician assistant for Dr. Donnie Mesa.  Devony Mcgrady 05/03/2011, 7:20 PM

## 2011-05-03 NOTE — Progress Notes (Signed)
Addendum  Patient seen and examined, chart and data base reviewed.  I agree with the above assessment and plan  For full details please see Mrs. [name] PA. note.  Colonic mass, malignant till proven otherwise, Gen Surgery to evaluate.  Elona Yinger A  05/03/2011, 4:57 PM

## 2011-05-03 NOTE — Progress Notes (Signed)
Patient ID: [name], female   DOB: August 30, 1945, 65 y.o.   MRN: [name]  [name] is an 65 y.o. female with a history of Breast Cancer S/p Lumpectomy and Radiation therapy to Right Breast (diagnosed in 08/2010 and last treatments in 11/2010) presenting with complaints of worsening weakness and increased falls over past month but worse this past week. She also reports having increased SOB and DOE. She denies having any chest pain. Her last fall resulted in increased pain and bruising of the right eye, and right breast area. She was evaluated in the ED and underwent a CT scan of the Brain which was negative for acute findings, and her laboratory studies revealed abnormalities with a hemoglobin level of 6.7 and a potassium level of 2.8.  Patient also has a history of chronic back pain (after neurosurgery)  and has a pain management doctor.  Consults:  Psychiatry       Gastroenterology  Procedures:  Colonoscopy 05/03/11.  Found hepatic mass likely cancer.  Subjective:  Patients back pain has improved.  She is aware that she has a colonic mass - likely colon ca.  Objective: Weight change:   Intake/Output Summary (Last 24 hours) at 05/03/11 1429 Last data filed at 05/03/11 1100  Gross per 24 hour  Intake 379.33 ml  Output      0 ml  Net 379.33 ml   Blood pressure 151/73, pulse 58, temperature 97.2 F (36.2 C), temperature source Oral, resp. rate 20, height 5\' 4"  (1.626 m), weight 70.308 kg (155 lb), SpO2 91.00%. Filed Vitals:   05/03/11 1050 05/03/11 1100 05/03/11 1110 05/03/11 1149  BP: 93/45 98/42 96/42  151/73  Pulse:    58  Temp:    97.2 F (36.2 C)  TempSrc:    Oral  Resp: 22 33 23 20  Height:      Weight:      SpO2: 96% 96% 91% 91%    Physical Exam: General: No acute distress, appears more comfortable today. Lungs: Clear to auscultation bilaterally without wheezes or crackles.  No work of breathing or SOB Cardiovascular: Regular rate and rhythm without  murmur gallop or rub normal S1 and S2 Abdomen: Non tender, non distended, soft, bowel sounds positive, no rebound, no ascites, no appreciable mass Extremities: No significant cyanosis, clubbing, or edema bilateral lower extremities  Basic Metabolic Panel:  Lab 0000000 0522 05/02/11 1847 05/02/11 1145  NA 144 145 --  K 3.1* 3.5 --  CL 103 104 --  CO2 30 30 --  GLUCOSE 98 92 --  BUN 9 7 --  CREATININE 1.01 0.76 --  CALCIUM 9.7 10.0 --  MG -- -- 1.7  PHOS -- -- --   Liver Function Tests:  Lab 04/30/11 2246  AST 29  ALT 18  ALKPHOS 81  BILITOT 0.2*  PROT 6.3  ALBUMIN 3.2*     CBC:  Lab 05/03/11 0522 05/02/11 0834 04/30/11 2246  WBC 6.9 7.3 --  NEUTROABS -- -- 3.3  HGB 11.5* 11.5* --  HCT 36.8 35.1* --  MCV 75.1* 73.9* --  PLT 274 253 --  Coagulation:  Lab 05/02/11 1145  LABPROT 13.6  INR 1.02   Anemia Panel:  Lab 05/01/11 0917  VITAMINB12 1095*  FOLATE >20.0  FERRITIN 12  TIBC 438  IRON 12*  RETICCTPCT 1.9    Studies/Results: Scheduled Meds:   . sodium chloride   Intravenous Once  . sodium chloride   Intravenous Once  . anastrozole  1 mg  Oral QHS  . bisacodyl  10 mg Oral Once  . buPROPion  300 mg Oral Daily  . furosemide  40 mg Oral Daily  . nebivolol  10 mg Oral Daily  . oxyCODONE  40 mg Oral Q12H  . pantoprazole (PROTONIX) IV  40 mg Intravenous Q12H  . polyethylene glycol-electrolytes  4,000 mL Oral Once  . potassium chloride  10 mEq Intravenous Q1 Hr x 4  . potassium chloride  20 mEq Oral BID  . potassium chloride  20 mEq Oral BID  . potassium chloride  40 mEq Oral Once   Continuous Infusions:   . 0.9 % NaCl with KCl 20 mEq / L Stopped (05/02/11 1859)   PRN Meds:.acetaminophen, acetaminophen, ALPRAZolam, alum & mag hydroxide-simeth, HYDROmorphone, ondansetron (ZOFRAN) IV, ondansetron, oxyCODONE, zolpidem, DISCONTD: fentaNYL, DISCONTD: midazolam, DISCONTD: spot ink marker  Assessment/Plan: Principal Problem:  *Anemia - Iron deficient.   patient has had 3 units of PRBCs.  Hgb stable.  Active Problems:  Colonic mass - hepatic flexure mass found on colonoscopy today likely colon ca.  Surgery has been consulted.  Dr. Humphrey Rolls has been made aware.  CT Chest, abd, pelvis ordered.   CEA ordered.   GI bleed - Please see above.  Anemia and GI Bleed secondary to colon mass.   Hypokalemia - Giving supplementation.  Difficult to replete.  Mag. 1.7   Anxiety and depression - Psychiatry recommends continuing current meds and outpatient follow up.   Weakness generalized  Falls frequently  HTN (hypertension)  Lower extremity edema    LOS: 3 days   YORK,Nasirah Sachs L 05/03/2011, 2:29 PM [phone]

## 2011-05-03 NOTE — Op Note (Signed)
[facility] [address] [place], [place]  [address]  COLONOSCOPY PROCEDURE REPORT  PATIENT:  [name], [name]  MR#:  [name] BIRTHDATE:  1945/08/26, 65 yrs. old  GENDER:  female ENDOSCOPIST:  Clarene Essex, MD REF. BY:  [name], M.D. PROCEDURE DATE:  05/03/2011 PROCEDURE:  Colonoscopy with biopsy, Colonoscopy with submucosal injection ASA CLASS:  Class II INDICATIONS:  iron deficient guaiac positivity MEDICATIONS:  100 mcg of fentanyl 8.5 mg Versed DESCRIPTION OF PROCEDURE:   After the risks benefits and alternatives of the procedure were thoroughly explained, informed consent was obtained.  The [id number] endoscope was introduced through the anus and advanced to the near obstructing mass, or The quality of the prep was adequate..  The instrument was then slowly withdrawn as the colon was fully examined. to advanced to the mass did require abdominal pressure but no position changes but we could not advance past the mass due to the significantly narrowed lumen <<PROCEDUREIMAGES>>  FINDINGS: 1. Near obstructing colon cancer probably had hepatic flexure status post biopsy and [place] ink x2 injection 2 cc per injection just distal to the mass 2. Small sigmoid polyp cold biopsied 3. Small internal/external hemorrhoids 4. Minimal early left-sided diverticuli 5. Otherwise within normal limits to mass COMPLICATIONS: None  IMPRESSION: Near obstructing colon cancer probably at hepatic flexure  RECOMMENDATIONS: Surgical consult probable CT scan CEA with next labs await biopsies let us know if we can be of any further assistance  ______________________________ Clarene Essex, MD  CC:  [name], MD  n. Lorrin MaisClarene Essex at 05/03/2011 10:38 AM  [name], [name], [name]

## 2011-05-04 ENCOUNTER — Encounter (HOSPITAL_COMMUNITY): Payer: Self-pay | Admitting: Gastroenterology

## 2011-05-04 ENCOUNTER — Ambulatory Visit
Admission: RE | Admit: 2011-05-04 | Payer: PRIVATE HEALTH INSURANCE | Source: Ambulatory Visit | Admitting: Radiation Oncology

## 2011-05-04 LAB — BASIC METABOLIC PANEL
BUN: 11 mg/dL (ref 6–23)
CO2: 30 mEq/L (ref 19–32)
Calcium: 9.6 mg/dL (ref 8.4–10.5)
GFR calc non Af Amer: 50 mL/min — ABNORMAL LOW (ref >90)
Glucose, Bld: 111 mg/dL — ABNORMAL HIGH (ref 70–99)
Potassium: 3.4 mEq/L — ABNORMAL LOW (ref 3.5–5.1)

## 2011-05-04 MED ORDER — POTASSIUM CHLORIDE CRYS ER 20 MEQ PO TBCR
60.0000 meq | EXTENDED_RELEASE_TABLET | Freq: Once | ORAL | Status: AC
  Administered 2011-05-04: 60 meq via ORAL
  Filled 2011-05-04: qty 3

## 2011-05-04 MED ORDER — SODIUM CHLORIDE 0.9 % IV SOLN
INTRAVENOUS | Status: DC
  Administered 2011-05-04 – 2011-05-05 (×2): via INTRAVENOUS

## 2011-05-04 MED ORDER — ALVIMOPAN 12 MG PO CAPS
12.0000 mg | ORAL_CAPSULE | Freq: Once | ORAL | Status: DC
  Filled 2011-05-04: qty 1

## 2011-05-04 MED ORDER — POTASSIUM CHLORIDE CRYS ER 20 MEQ PO TBCR: 20.0000 meq | EXTENDED_RELEASE_TABLET | Freq: Two times a day (BID) | ORAL | Status: DC

## 2011-05-04 MED ORDER — SODIUM CHLORIDE 0.9 % IV SOLN
1.0000 g | Freq: Once | INTRAVENOUS | Status: AC
  Administered 2011-05-05: 1 g via INTRAVENOUS
  Filled 2011-05-04: qty 1

## 2011-05-04 NOTE — Progress Notes (Signed)
Subjective: Still weak.  No abdominal pain.  Objective: Vital signs in last 24 hours: Temp:  [97.2 F (36.2 C)-98.3 F (36.8 C)] 98.3 F (36.8 C) (12/13 1000) Pulse Rate:  [55-63] 58  (12/13 1000) Resp:  [18-33] 21  (12/13 1000) BP: (96-165)/(42-82) 162/66 mmHg (12/13 1000) SpO2:  [91 %-98 %] 95 % (12/13 1000) Weight change:  Last BM Date: 05/03/11  PE: GEN:  Non-toxic appearing ABD:  Soft  Lab Results: Results for orders placed during the hospital encounter of 04/30/11 (from the past 48 hour(s))  PRO B NATRIURETIC PEPTIDE     Status: Abnormal   Collection Time   05/02/11 11:45 AM      Component Value Range Comment   Pro B Natriuretic peptide (BNP) 3468.0 (*) 0 - 125 (pg/mL)   MAGNESIUM     Status: Normal   Collection Time   05/02/11 11:45 AM      Component Value Range Comment   Magnesium 1.7  1.5 - 2.5 (mg/dL)   PROTIME-INR     Status: Normal   Collection Time   05/02/11 11:45 AM      Component Value Range Comment   Prothrombin Time 13.6  11.6 - 15.2 (seconds)    INR 1.02  0.00 - 1.49    APTT     Status: Normal   Collection Time   05/02/11 11:45 AM      Component Value Range Comment   aPTT 29  24 - 37 (seconds)   BASIC METABOLIC PANEL     Status: Abnormal   Collection Time   05/02/11  6:47 PM      Component Value Range Comment   Sodium 145  135 - 145 (mEq/L)    Potassium 3.5  3.5 - 5.1 (mEq/L) DELTA CHECK NOTED   Chloride 104  96 - 112 (mEq/L)    CO2 30  19 - 32 (mEq/L)    Glucose, Bld 92  70 - 99 (mg/dL)    BUN 7  6 - 23 (mg/dL)    Creatinine, Ser 0.76  0.50 - 1.10 (mg/dL)    Calcium 10.0  8.4 - 10.5 (mg/dL)    GFR calc non Af Amer 87 (*) >90 (mL/min)    GFR calc Af Amer >90  >90 (mL/min)   CBC     Status: Abnormal   Collection Time   05/03/11  5:22 AM      Component Value Range Comment   WBC 6.9  4.0 - 10.5 (K/uL)    RBC 4.90  3.87 - 5.11 (MIL/uL)    Hemoglobin 11.5 (*) 12.0 - 15.0 (g/dL)    HCT 36.8  36.0 - 46.0 (%)    MCV 75.1 (*) 78.0 - 100.0  (fL)    MCH 23.5 (*) 26.0 - 34.0 (pg)    MCHC 31.3  30.0 - 36.0 (g/dL)    RDW 16.6 (*) 11.5 - 15.5 (%)    Platelets 274  150 - 400 (K/uL)   BASIC METABOLIC PANEL     Status: Abnormal   Collection Time   05/03/11  5:22 AM      Component Value Range Comment   Sodium 144  135 - 145 (mEq/L)    Potassium 3.1 (*) 3.5 - 5.1 (mEq/L)    Chloride 103  96 - 112 (mEq/L)    CO2 30  19 - 32 (mEq/L)    Glucose, Bld 98  70 - 99 (mg/dL)    BUN 9  6 - 23 (mg/dL)  Creatinine, Ser 1.01  0.50 - 1.10 (mg/dL)    Calcium 9.7  8.4 - 10.5 (mg/dL)    GFR calc non Af Amer 58 (*) >90 (mL/min)    GFR calc Af Amer 67 (*) >90 (mL/min)   CEA     Status: Normal   Collection Time   05/03/11  1:49 PM      Component Value Range Comment   CEA 2.4  0.0 - 5.0 (ng/mL)   BASIC METABOLIC PANEL     Status: Abnormal   Collection Time   05/04/11  6:20 AM      Component Value Range Comment   Sodium 138  135 - 145 (mEq/L)    Potassium 3.4 (*) 3.5 - 5.1 (mEq/L)    Chloride 100  96 - 112 (mEq/L)    CO2 30  19 - 32 (mEq/L)    Glucose, Bld 111 (*) 70 - 99 (mg/dL)    BUN 11  6 - 23 (mg/dL)    Creatinine, Ser 1.14 (*) 0.50 - 1.10 (mg/dL)    Calcium 9.6  8.4 - 10.5 (mg/dL)    GFR calc non Af Amer 50 (*) >90 (mL/min)    GFR calc Af Amer 58 (*) >90 (mL/min)     Studies/Results: Ct Chest W Contrast  05/03/2011  *RADIOLOGY REPORT*  Clinical Data:  Anemia, weight loss, abdominal pain, colon cancer, evaluate for metastases.  History of breast cancer.  CT CHEST, ABDOMEN AND PELVIS WITH CONTRAST  Technique:  Multidetector CT imaging of the chest, abdomen and pelvis was performed following the standard protocol during bolus administration of intravenous contrast.  Contrast: 131mL OMNIPAQUE IOHEXOL 300 MG/ML IV SOLN  Comparison:  None  CT CHEST  Findings:  Mild centrilobular emphysematous changes.  Mild subpleural reticulation in the anterior right upper lobe, likely reflecting radiation changes.  Small right pleural effusion with  associated lower lobe atelectasis.  The heart is normal in size.  No pericardial effusion.  Coronary atherosclerosis.  Atherosclerotic calcifications of the aortic arch.  No suspicious mediastinal, hilar, or axillary lymphadenopathy.  Postsurgical changes in the lateral right breast.  Overlying skin thickening, likely reflecting prior radiation.  Moderate degenerative changes of the thoracic spine.  Old right posterolateral rib deformities.  Bilateral shoulder arthroplasties. No focal osseous lesions.  IMPRESSION: Postsurgical changes in the lateral right breast.  Postradiation changes in the right breast and anterior right upper lobe.  No evidence of recurrent or metastatic disease in the chest.  Mild centrilobular emphysematous changes.  CT ABDOMEN AND PELVIS  Findings:  Tiny hypoenhancing lesion in the right hepatic lobe (series 2/image 51), too small to characterize but measuring simple fluid density and likely benign.  Spleen, pancreas, and adrenal glands are within normal limits.  Gallbladder is unremarkable.  No intrahepatic or extrahepatic ductal dilatation.  Kidneys are unremarkable.  No hydronephrosis.  No evidence of bowel obstruction.  Normal appendix.  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  Mild wall thickening involving a loop of sigmoid colon (series 2/image 101), possibly reflecting underdistension versus muscular hypertrophy secondary to diverticulosis.  No suspicious abdominopelvic lymphadenopathy.  Atherosclerotic calcifications of the abdominal aorta and branch vessels.  Small volume pelvic ascites.  Status post hysterectomy.  No adnexal masses.  Moderate degenerative changes of the lumbar spine.  Status post PLIF at L2-3 and L4-5.  No focal osseous lesions.  IMPRESSION: Concentric wall thickening involving a 3.4 cm segment  of the ascending colon, likely reflecting colonic  adenocarcinoma.  No evidence of metastatic disease in the abdomen/pelvis.  Additional ancillary findings as above.  Original Report Authenticated By: Julian Hy, M.D.   Ct Abdomen Pelvis W Contrast  05/03/2011  *RADIOLOGY REPORT*  Clinical Data:  Anemia, weight loss, abdominal pain, colon cancer, evaluate for metastases.  History of breast cancer.  CT CHEST, ABDOMEN AND PELVIS WITH CONTRAST  Technique:  Multidetector CT imaging of the chest, abdomen and pelvis was performed following the standard protocol during bolus administration of intravenous contrast.  Contrast: 150mL OMNIPAQUE IOHEXOL 300 MG/ML IV SOLN  Comparison:  None  CT CHEST  Findings:  Mild centrilobular emphysematous changes.  Mild subpleural reticulation in the anterior right upper lobe, likely reflecting radiation changes.  Small right pleural effusion with associated lower lobe atelectasis.  The heart is normal in size.  No pericardial effusion.  Coronary atherosclerosis.  Atherosclerotic calcifications of the aortic arch.  No suspicious mediastinal, hilar, or axillary lymphadenopathy.  Postsurgical changes in the lateral right breast.  Overlying skin thickening, likely reflecting prior radiation.  Moderate degenerative changes of the thoracic spine.  Old right posterolateral rib deformities.  Bilateral shoulder arthroplasties. No focal osseous lesions.  IMPRESSION: Postsurgical changes in the lateral right breast.  Postradiation changes in the right breast and anterior right upper lobe.  No evidence of recurrent or metastatic disease in the chest.  Mild centrilobular emphysematous changes.  CT ABDOMEN AND PELVIS  Findings:  Tiny hypoenhancing lesion in the right hepatic lobe (series 2/image 51), too small to characterize but measuring simple fluid density and likely benign.  Spleen, pancreas, and adrenal glands are within normal limits.  Gallbladder is unremarkable.  No intrahepatic or extrahepatic ductal dilatation.  Kidneys are  unremarkable.  No hydronephrosis.  No evidence of bowel obstruction.  Normal appendix.  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  Mild wall thickening involving a loop of sigmoid colon (series 2/image 101), possibly reflecting underdistension versus muscular hypertrophy secondary to diverticulosis.  No suspicious abdominopelvic lymphadenopathy.  Atherosclerotic calcifications of the abdominal aorta and branch vessels.  Small volume pelvic ascites.  Status post hysterectomy.  No adnexal masses.  Moderate degenerative changes of the lumbar spine.  Status post PLIF at L2-3 and L4-5.  No focal osseous lesions.  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  No evidence of metastatic disease in the abdomen/pelvis.  Additional ancillary findings as above.  Original Report Authenticated By: Julian Hy, M.D.    Assessment:  1.  Anemia with hemoccult-positive stool.  Post-transfusions.  Hgb 11.5. 2.  Right colon mass, partially obstructing, highly likely cause of #1 above.  Plan:  1.  Awaiting final colonoscopy biopsy results. 2.  Right hemicolectomy tentatively planned for tomorrow with Dr. [name]. 3.  Will sign off; please call with questions.  We will tentatively plan surveillance colonoscopy in one year's time.   [name] 05/04/2011, 10:59 AM

## 2011-05-04 NOTE — Progress Notes (Addendum)
Patient ID: [name], female   DOB: 03-07-1946, 65 y.o.   MRN: [id number] Patient ID: [name], female   DOB: 1945/12/11, 65 y.o.   MRN: [id number]  [name] is an 65 y.o. female with a history of Breast Cancer S/p Lumpectomy and Radiation therapy to Right Breast (diagnosed in 08/2010 and last treatments in 11/2010) presenting with complaints of worsening weakness and increased falls over past month but worse this past week. She also reports having increased SOB and DOE. She denies having any chest pain. Her last fall resulted in increased pain and bruising of the right eye, and right breast area. She was evaluated in the ED and underwent a CT scan of the Brain which was negative for acute findings, and her laboratory studies revealed abnormalities with a hemoglobin level of 6.7 and a potassium level of 2.8.  Patient also has a history of chronic back pain (after neurosurgery)  and has a pain management doctor.  Both patient's sister and daughter are concerned that the patient is abusing Rx benzos and narcotics.  Sister also expressed that the patient has suicidal ideations and a gun at home.  Consults:  Psychiatry       Gastroenterology  Procedures:  Colonoscopy 05/03/11.   Subjective:  Patients back pain has improved.  She is aware that she has a colonic mass - likely colon ca.  Objective: Weight change:   Intake/Output Summary (Last 24 hours) at 05/04/11 1651 Last data filed at 05/04/11 1300  Gross per 24 hour  Intake    900 ml  Output      3 ml  Net    897 ml   Blood pressure 118/77, pulse 56, temperature 98.1 F (36.7 C), temperature source Oral, resp. rate 20, height 5\' 4"  (1.626 m), weight 70.308 kg (155 lb), SpO2 95.00%. Filed Vitals:   05/03/11 2205 05/04/11 0420 05/04/11 1000 05/04/11 1400  BP: 140/62 165/82 162/66 118/77  Pulse: 62 63 58 56  Temp: 98.1 F (36.7 C) 97.6 F (36.4 C) 98.3 F (36.8 C) 98.1 F (36.7 C)  TempSrc: Oral Oral Oral Oral    Resp: 21 20 21 20   Height:      Weight:      SpO2: 98% 91% 95% 95%    Physical Exam: General: No acute distress, appears more comfortable today. Lungs: Clear to auscultation bilaterally without wheezes or crackles.  No work of breathing or SOB Cardiovascular: Regular rate and rhythm without murmur gallop or rub normal S1 and S2 Abdomen: Non tender, non distended, soft, bowel sounds positive, no rebound, no ascites, no appreciable mass Extremities: No significant cyanosis, clubbing, or edema bilateral lower extremities  Basic Metabolic Panel:  Lab [id number] 0620 05/03/11 0522 05/02/11 1145  NA 138 144 --  K 3.4* 3.1* --  CL 100 103 --  CO2 30 30 --  GLUCOSE 111* 98 --  BUN 11 9 --  CREATININE 1.14* 1.01 --  CALCIUM 9.6 9.7 --  MG -- -- 1.7  PHOS -- -- --   Liver Function Tests:  Lab 04/30/11 2246  AST 29  ALT 18  ALKPHOS 81  BILITOT 0.2*  PROT 6.3  ALBUMIN 3.2*     CBC:  Lab 05/03/11 0522 05/02/11 0834 04/30/11 2246  WBC 6.9 7.3 --  NEUTROABS -- -- 3.3  HGB 11.5* 11.5* --  HCT 36.8 35.1* --  MCV 75.1* 73.9* --  PLT 274 253 --  Coagulation:  Lab 05/02/11 1145  LABPROT  13.6  INR 1.02   Anemia Panel:  Lab 05/01/11 0917  VITAMINB12 1095*  FOLATE >20.0  FERRITIN 12  TIBC 438  IRON 12*  RETICCTPCT 1.9    Studies/Results: Scheduled Meds:    . alvimopan  12 mg Oral Once  . anastrozole  1 mg Oral QHS  . buPROPion  300 mg Oral Daily  . ertapenem  1 g Intravenous Once  . feeding supplement  237 mL Oral TID WC  . furosemide  40 mg Oral Daily  . nebivolol  10 mg Oral Daily  . oxyCODONE  40 mg Oral Q12H  . pantoprazole (PROTONIX) IV  40 mg Intravenous Q12H  . DISCONTD: potassium chloride  20 mEq Oral BID  . DISCONTD: potassium chloride  20 mEq Oral BID  . DISCONTD: potassium chloride  20 mEq Oral BID   Continuous Infusions:    . sodium chloride    . 0.9 % NaCl with KCl 20 mEq / L Stopped (05/02/11 1859)   PRN Meds:.acetaminophen,  acetaminophen, ALPRAZolam, alum & mag hydroxide-simeth, HYDROmorphone, iohexol, ondansetron (ZOFRAN) IV, ondansetron, oxyCODONE, zolpidem  Assessment/Plan: Principal Problem:  *Anemia - Iron deficient.  patient has had 3 units of PRBCs.  Hgb stable.  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  Surgery scheduled for 05/05/11 with Dr. Gershon Crane.  Dr. Chancy Milroy made aware.   GI bleed - Please see above.  Anemia and GI Bleed secondary to colon mass.   Hypokalemia - Giving supplementation.  Difficult to replete.  (Secondary to lasix?) .  Mag. 1.7.  As patient will be NPO after midnight I will start IV fluids with 68meq kcl. At 48ml/hr.   Anxiety and depression - Possible suicidal ideations.  Psych/Social work have signed off because of acute issues and impending surgery.  Will re-consult before discharge.  Narcotic dependence with concern for abusing benzos and narcotics - At family's request & with the patient's permission, the RN went thru the patient's purse and give her Rx Xanax to the patient's brother, who will store it at home.    Weakness generalized  Falls frequently  HTN (hypertension) - stable  Lower extremity edema - resolved on lasix.    LOS: 4 days   Melton Alar 05/04/2011, 4:51 PM [phone] Addendum  Patient seen and examined, chart and data base reviewed.  I agree with the above assessment and plan  For full details please see Mrs. [name] PA. Note.  Surgery in AM.  Randel Hargens A  05/04/2011, 5:19 PM

## 2011-05-04 NOTE — Progress Notes (Signed)
PT Cancellation Note  Pt pleasantly refusing PT today, would like for PT to reassess her after her surgery tomorrow. PT will check on pt tomorrow. Thank you!  [name] ([name]) Oswaldo Conroy PT, DPT Acute Rehabilitation [phone]

## 2011-05-04 NOTE — Progress Notes (Signed)
CSW spoke with MD on patient current medical plan, patient is scheduled for surgery on Friday, will follow up on needs when patient more stable and appropriate.  Will sign off for now and if needed please re-consult.  Caleen Essex, MSW LCSW [phone]

## 2011-05-04 NOTE — Consult Note (Addendum)
Agree with above.  Hopefully path report will return today and we can proceed with right hemicolectomy tomorrow.  Maintain clears for now.  Patient is emotionally fragile and quite upset about the current situation.  I talked to her for awhile and discussed the plan for surgery on Friday.  The surgical procedure has been discussed with the patient.  Potential risks, benefits, alternative treatments, and expected outcomes have been explained.  All of the patient's questions at this time have been answered.  The likelihood of reaching the patient's treatment goal is good.  The patient understand the proposed surgical procedure and wishes to proceed.  [name]. [name], MD, [facility]  05/04/2011 7:44 AM

## 2011-05-05 ENCOUNTER — Inpatient Hospital Stay (HOSPITAL_COMMUNITY): Payer: PRIVATE HEALTH INSURANCE | Admitting: Anesthesiology

## 2011-05-05 ENCOUNTER — Other Ambulatory Visit (INDEPENDENT_AMBULATORY_CARE_PROVIDER_SITE_OTHER): Payer: Self-pay | Admitting: Surgery

## 2011-05-05 ENCOUNTER — Encounter (HOSPITAL_COMMUNITY)
Admission: EM | Disposition: A | Discharge status: Home / Self Care | Payer: Self-pay | Source: Home / Self Care | Attending: Internal Medicine

## 2011-05-05 ENCOUNTER — Encounter (HOSPITAL_COMMUNITY): Payer: Self-pay | Admitting: Anesthesiology

## 2011-05-05 DIAGNOSIS — C189 Malignant neoplasm of colon, unspecified: Secondary | ICD-10-CM

## 2011-05-05 LAB — BASIC METABOLIC PANEL WITH GFR
BUN: 11 mg/dL (ref 6–23)
CO2: 30 meq/L (ref 19–32)
Calcium: 9.6 mg/dL (ref 8.4–10.5)
Chloride: 102 meq/L (ref 96–112)
Creatinine, Ser: 1.15 mg/dL — ABNORMAL HIGH (ref 0.50–1.10)
GFR calc Af Amer: 57 mL/min — ABNORMAL LOW
GFR calc non Af Amer: 49 mL/min — ABNORMAL LOW
Glucose, Bld: 82 mg/dL (ref 70–99)
Potassium: 3.7 meq/L (ref 3.5–5.1)
Sodium: 141 meq/L (ref 135–145)

## 2011-05-05 SURGERY — LAPAROSCOPIC PARTIAL COLECTOMY
Anesthesia: General | Site: Abdomen | Wound class: Clean Contaminated

## 2011-05-05 MED ORDER — ALPRAZOLAM 0.5 MG PO TABS
0.5000 mg | ORAL_TABLET | Freq: Once | ORAL | Status: DC
  Filled 2011-05-05: qty 1

## 2011-05-05 MED ORDER — HYDROMORPHONE 0.3 MG/ML IV SOLN
INTRAVENOUS | Status: DC
  Administered 2011-05-05: 22:00:00 via INTRAVENOUS
  Administered 2011-05-05: 1.5 mg via INTRAVENOUS
  Administered 2011-05-05: 14:00:00 via INTRAVENOUS
  Filled 2011-05-05 (×2): qty 25

## 2011-05-05 MED ORDER — HYDROMORPHONE HCL PF 1 MG/ML IJ SOLN
0.2500 mg | INTRAMUSCULAR | Status: DC | PRN
  Administered 2011-05-05 (×4): 0.5 mg via INTRAVENOUS

## 2011-05-05 MED ORDER — GLYCOPYRROLATE 0.2 MG/ML IJ SOLN
INTRAMUSCULAR | Status: DC | PRN
  Administered 2011-05-05: .7 mg via INTRAVENOUS

## 2011-05-05 MED ORDER — SODIUM CHLORIDE 0.9 % IJ SOLN
9.0000 mL | INTRAMUSCULAR | Status: DC | PRN
  Administered 2011-05-05: 3 mL via INTRAVENOUS

## 2011-05-05 MED ORDER — MIDAZOLAM HCL 2 MG/2ML IJ SOLN
2.0000 mg | INTRAMUSCULAR | Status: DC | PRN
  Filled 2011-05-05: qty 2

## 2011-05-05 MED ORDER — NALOXONE HCL 0.4 MG/ML IJ SOLN: 0.4000 mg | INTRAMUSCULAR | Status: DC | PRN

## 2011-05-05 MED ORDER — ONDANSETRON HCL 4 MG/2ML IJ SOLN
4.0000 mg | Freq: Four times a day (QID) | INTRAMUSCULAR | Status: AC | PRN
  Administered 2011-05-05: 4 mg via INTRAVENOUS

## 2011-05-05 MED ORDER — MIDAZOLAM HCL 2 MG/2ML IJ SOLN
1.0000 mg | INTRAMUSCULAR | Status: DC | PRN
  Administered 2011-05-05: 2 mg via INTRAVENOUS

## 2011-05-05 MED ORDER — MIDAZOLAM HCL 2 MG/2ML IJ SOLN
INTRAMUSCULAR | Status: AC
  Filled 2011-05-05: qty 2

## 2011-05-05 MED ORDER — HYDROMORPHONE 0.3 MG/ML IV SOLN
INTRAVENOUS | Status: DC
  Administered 2011-05-06: 5.4 mg via INTRAVENOUS
  Administered 2011-05-06: 06:00:00 via INTRAVENOUS
  Administered 2011-05-06: 12 mL via INTRAVENOUS
  Administered 2011-05-06: 1.2 mL via INTRAVENOUS
  Administered 2011-05-06: 3 mL via INTRAVENOUS
  Filled 2011-05-05 (×2): qty 25

## 2011-05-05 MED ORDER — ONDANSETRON HCL 4 MG/2ML IJ SOLN
INTRAMUSCULAR | Status: DC | PRN
  Administered 2011-05-05: 4 mg via INTRAVENOUS

## 2011-05-05 MED ORDER — LACTATED RINGERS IV SOLN
INTRAVENOUS | Status: DC
  Administered 2011-05-05: 09:00:00 via INTRAVENOUS

## 2011-05-05 MED ORDER — HYDROMORPHONE HCL PF 1 MG/ML IJ SOLN: 0.5000 mg | INTRAMUSCULAR | Status: DC | PRN

## 2011-05-05 MED ORDER — 0.9 % SODIUM CHLORIDE (POUR BTL) OPTIME
TOPICAL | Status: DC | PRN
  Administered 2011-05-05: 1000 mL

## 2011-05-05 MED ORDER — MIDAZOLAM HCL 2 MG/2ML IJ SOLN
2.0000 mg | INTRAMUSCULAR | Status: DC | PRN
  Administered 2011-05-05: 2 mg via INTRAVENOUS

## 2011-05-05 MED ORDER — BUPIVACAINE-EPINEPHRINE (PF) 0.25% -1:200000 IJ SOLN
INTRAMUSCULAR | Status: DC | PRN
  Administered 2011-05-05: 2 mL

## 2011-05-05 MED ORDER — PROPOFOL 10 MG/ML IV EMUL
INTRAVENOUS | Status: DC | PRN
  Administered 2011-05-05: 150 mg via INTRAVENOUS
  Administered 2011-05-05 (×2): 25 mg via INTRAVENOUS

## 2011-05-05 MED ORDER — DIPHENHYDRAMINE HCL 50 MG/ML IJ SOLN: 12.5000 mg | Freq: Four times a day (QID) | INTRAMUSCULAR | Status: DC | PRN

## 2011-05-05 MED ORDER — HYDROMORPHONE HCL PF 1 MG/ML IJ SOLN
INTRAMUSCULAR | Status: AC
  Filled 2011-05-05: qty 1

## 2011-05-05 MED ORDER — HYDROMORPHONE 0.3 MG/ML IV SOLN: INTRAVENOUS | Status: DC

## 2011-05-05 MED ORDER — PHENYLEPHRINE HCL 10 MG/ML IJ SOLN
INTRAMUSCULAR | Status: DC | PRN
  Administered 2011-05-05: 80 ug via INTRAVENOUS

## 2011-05-05 MED ORDER — KCL IN DEXTROSE-NACL 20-5-0.45 MEQ/L-%-% IV SOLN
INTRAVENOUS | Status: DC
  Administered 2011-05-05 – 2011-05-07 (×5): via INTRAVENOUS
  Filled 2011-05-05 (×12): qty 1000

## 2011-05-05 MED ORDER — DIPHENHYDRAMINE HCL 12.5 MG/5ML PO ELIX
12.5000 mg | ORAL_SOLUTION | Freq: Four times a day (QID) | ORAL | Status: DC | PRN
  Filled 2011-05-05: qty 5

## 2011-05-05 MED ORDER — ROCURONIUM BROMIDE 100 MG/10ML IV SOLN
INTRAVENOUS | Status: DC | PRN
  Administered 2011-05-05: 50 mg via INTRAVENOUS

## 2011-05-05 MED ORDER — SUFENTANIL CITRATE 50 MCG/ML IV SOLN
INTRAVENOUS | Status: DC | PRN
  Administered 2011-05-05 (×2): 10 ug via INTRAVENOUS
  Administered 2011-05-05: 20 ug via INTRAVENOUS
  Administered 2011-05-05: 10 ug via INTRAVENOUS

## 2011-05-05 MED ORDER — NEOSTIGMINE METHYLSULFATE 1 MG/ML IJ SOLN
INTRAMUSCULAR | Status: DC | PRN
  Administered 2011-05-05: 5 mg via INTRAVENOUS

## 2011-05-05 MED ORDER — ALVIMOPAN 12 MG PO CAPS
12.0000 mg | ORAL_CAPSULE | Freq: Two times a day (BID) | ORAL | Status: DC
  Administered 2011-05-06 – 2011-05-09 (×7): 12 mg via ORAL
  Filled 2011-05-05 (×8): qty 1

## 2011-05-05 MED ORDER — SODIUM CHLORIDE 0.9 % IV SOLN
1.0000 g | INTRAVENOUS | Status: AC
  Administered 2011-05-05: 1 g via INTRAVENOUS
  Filled 2011-05-05 (×2): qty 1

## 2011-05-05 MED ORDER — ONDANSETRON HCL 4 MG/2ML IJ SOLN
INTRAMUSCULAR | Status: AC
  Filled 2011-05-05: qty 2

## 2011-05-05 MED ORDER — LACTATED RINGERS IV SOLN
INTRAVENOUS | Status: DC | PRN
  Administered 2011-05-05 (×3): via INTRAVENOUS

## 2011-05-05 MED ORDER — ENOXAPARIN SODIUM 40 MG/0.4ML ~~LOC~~ SOLN
40.0000 mg | SUBCUTANEOUS | Status: DC
  Administered 2011-05-06 – 2011-05-08 (×3): 40 mg via SUBCUTANEOUS
  Filled 2011-05-05 (×4): qty 0.4

## 2011-05-05 MED ORDER — LIDOCAINE HCL (CARDIAC) 20 MG/ML IV SOLN
INTRAVENOUS | Status: DC | PRN
  Administered 2011-05-05: 40 mg via INTRAVENOUS

## 2011-05-05 MED ORDER — ONDANSETRON HCL 4 MG/2ML IJ SOLN
4.0000 mg | Freq: Four times a day (QID) | INTRAMUSCULAR | Status: DC | PRN
  Administered 2011-05-06: 4 mg via INTRAVENOUS

## 2011-05-05 MED ORDER — VECURONIUM BROMIDE 10 MG IV SOLR
INTRAVENOUS | Status: DC | PRN
  Administered 2011-05-05 (×2): 1 mg via INTRAVENOUS

## 2011-05-05 MED ORDER — MIDAZOLAM HCL 5 MG/5ML IJ SOLN
INTRAMUSCULAR | Status: DC | PRN
  Administered 2011-05-05: 2 mg via INTRAVENOUS

## 2011-05-05 SURGICAL SUPPLY — 73 items
APPLIER CLIP 5 13 M/L LIGAMAX5 (MISCELLANEOUS)
APPLIER CLIP ROT 10 11.4 M/L (STAPLE)
APR CLP MED LRG 11.4X10 (STAPLE)
APR CLP MED LRG 5 ANG JAW (MISCELLANEOUS)
BLADE SURG 10 STRL SS (BLADE) ×2 IMPLANT
BLADE SURG ROTATE 9660 (MISCELLANEOUS) IMPLANT
CANISTER SUCTION 2500CC (MISCELLANEOUS) ×2 IMPLANT
CELLS DAT CNTRL 66122 CELL SVR (MISCELLANEOUS) IMPLANT
CHLORAPREP W/TINT 26ML (MISCELLANEOUS) ×2 IMPLANT
CLIP APPLIE 5 13 M/L LIGAMAX5 (MISCELLANEOUS) IMPLANT
CLIP APPLIE ROT 10 11.4 M/L (STAPLE) IMPLANT
CLOTH BEACON ORANGE TIMEOUT ST (SAFETY) ×2 IMPLANT
COVER SURGICAL LIGHT HANDLE (MISCELLANEOUS) ×2 IMPLANT
DECANTER SPIKE VIAL GLASS SM (MISCELLANEOUS) ×2 IMPLANT
DRAPE PROXIMA HALF (DRAPES) IMPLANT
DRAPE UTILITY 15X26 W/TAPE STR (DRAPE) ×4 IMPLANT
DRSG TEGADERM 4X4.75 (GAUZE/BANDAGES/DRESSINGS) ×3 IMPLANT
ELECT CAUTERY BLADE 6.4 (BLADE) ×3 IMPLANT
ELECT REM PT RETURN 9FT ADLT (ELECTROSURGICAL) ×2
ELECTRODE REM PT RTRN 9FT ADLT (ELECTROSURGICAL) ×1 IMPLANT
GAUZE SPONGE 2X2 8PLY STRL LF (GAUZE/BANDAGES/DRESSINGS) IMPLANT
GAUZE SPONGE 4X4 12PLY STRL LF (GAUZE/BANDAGES/DRESSINGS) ×1 IMPLANT
GEL ULTRASOUND 20GR AQUASONIC (MISCELLANEOUS) IMPLANT
GLOVE BIO SURGEON STRL SZ7 (GLOVE) ×6 IMPLANT
GLOVE BIOGEL PI IND STRL 7.5 (GLOVE) ×1 IMPLANT
GLOVE BIOGEL PI IND STRL 8 (GLOVE) IMPLANT
GLOVE BIOGEL PI INDICATOR 7.5 (GLOVE) ×3
GLOVE BIOGEL PI INDICATOR 8 (GLOVE) ×2
GLOVE SURG SS PI 8.0 STRL IVOR (GLOVE) ×1 IMPLANT
GOWN STRL NON-REIN LRG LVL3 (GOWN DISPOSABLE) ×7 IMPLANT
KIT BASIN OR (CUSTOM PROCEDURE TRAY) ×2 IMPLANT
KIT ROOM TURNOVER OR (KITS) ×2 IMPLANT
LEGGING LITHOTOMY PAIR STRL (DRAPES) IMPLANT
LIGASURE 5MM LAPAROSCOPIC (INSTRUMENTS) IMPLANT
LIGASURE IMPACT 36 18CM CVD LR (INSTRUMENTS) ×1 IMPLANT
NS IRRIG 1000ML POUR BTL (IV SOLUTION) ×4 IMPLANT
PAD ARMBOARD 7.5X6 YLW CONV (MISCELLANEOUS) ×4 IMPLANT
PENCIL BUTTON HOLSTER BLD 10FT (ELECTRODE) ×2 IMPLANT
RELOAD PROXIMATE 75MM BLUE (ENDOMECHANICALS) ×2 IMPLANT
RELOAD STAPLE 75 3.8 BLU REG (ENDOMECHANICALS) IMPLANT
RETRACTOR WND ALEXIS 18 MED (MISCELLANEOUS) IMPLANT
RTRCTR WOUND ALEXIS 18CM MED (MISCELLANEOUS)
SCALPEL HARMONIC ACE (MISCELLANEOUS) IMPLANT
SCISSORS LAP 5X35 DISP (ENDOMECHANICALS) IMPLANT
SET IRRIG TUBING LAPAROSCOPIC (IRRIGATION / IRRIGATOR) IMPLANT
SLEEVE ENDOPATH XCEL 5M (ENDOMECHANICALS) ×2 IMPLANT
SPECIMEN JAR LARGE (MISCELLANEOUS) ×2 IMPLANT
SPONGE GAUZE 2X2 STER 10/PKG (GAUZE/BANDAGES/DRESSINGS) ×1
SPONGE GAUZE 4X4 12PLY (GAUZE/BANDAGES/DRESSINGS) ×1 IMPLANT
STAPLER GUN LINEAR PROX 60 (STAPLE) ×1 IMPLANT
STAPLER PROXIMATE 75MM BLUE (STAPLE) ×1 IMPLANT
STAPLER VISISTAT 35W (STAPLE) ×2 IMPLANT
STRIP CLOSURE SKIN 1/2X4 (GAUZE/BANDAGES/DRESSINGS) ×1 IMPLANT
SURGILUBE 2OZ TUBE FLIPTOP (MISCELLANEOUS) IMPLANT
SUT PROLENE 2 0 CT2 30 (SUTURE) IMPLANT
SUT PROLENE 2 0 KS (SUTURE) IMPLANT
SUT SILK 2 0 SH CR/8 (SUTURE) ×2 IMPLANT
SUT SILK 2 0 TIES 10X30 (SUTURE) ×2 IMPLANT
SUT SILK 3 0 SH CR/8 (SUTURE) ×2 IMPLANT
SUT SILK 3 0 TIES 10X30 (SUTURE) ×2 IMPLANT
SYR BULB IRRIGATION 50ML (SYRINGE) ×1 IMPLANT
SYS LAPSCP GELPORT 120MM (MISCELLANEOUS) ×2
SYSTEM LAPSCP GELPORT 120MM (MISCELLANEOUS) IMPLANT
TOWEL OR 17X24 6PK STRL BLUE (TOWEL DISPOSABLE) ×2 IMPLANT
TOWEL OR 17X26 10 PK STRL BLUE (TOWEL DISPOSABLE) ×2 IMPLANT
TRAY FOLEY CATH 14FRSI W/METER (CATHETERS) ×2 IMPLANT
TRAY LAPAROSCOPIC (CUSTOM PROCEDURE TRAY) ×2 IMPLANT
TRAY PROCTOSCOPIC FIBER OPTIC (SET/KITS/TRAYS/PACK) IMPLANT
TROCAR XCEL NON-BLD 11X100MML (ENDOMECHANICALS) IMPLANT
TROCAR XCEL NON-BLD 5MMX100MML (ENDOMECHANICALS) ×2 IMPLANT
TUBE CONNECTING 12X1/4 (SUCTIONS) ×2 IMPLANT
WATER STERILE IRR 1000ML POUR (IV SOLUTION) IMPLANT
YANKAUER SUCT BULB TIP NO VENT (SUCTIONS) ×2 IMPLANT

## 2011-05-05 NOTE — Progress Notes (Signed)
Pt is extremely anxious and uncomfortable after surgery.  Encouraged pt to use PCA pump. Ok to give a PRN dose of Xanax per Dr. Hartford Poli.

## 2011-05-05 NOTE — Transfer of Care (Signed)
Immediate Anesthesia Transfer of Care Note  Patient: [name]  Procedure(s) Performed:  LAPAROSCOPIC PARTIAL COLECTOMY - laparoscopic assisted partial colectomy  Patient Location: PACU  Anesthesia Type: General  Level of Consciousness: awake and sedated  Airway & Oxygen Therapy: Patient Spontanous Breathing and Patient connected to face mask oxygen  Post-op Assessment: Report given to PACU RN and Post -op Vital signs reviewed and stable  Post vital signs: Reviewed and stable  Complications: No apparent anesthesia complications

## 2011-05-05 NOTE — Progress Notes (Signed)
PT Cancellation Note  Pt recently back from surgery, not alert enough to reassess mobility. Will attempt to see her tomorrow. Thank you!  Marlina Cataldi ([name]) Oswaldo Conroy PT, DPT Acute Rehabilitation [phone]

## 2011-05-05 NOTE — Progress Notes (Signed)
Patient ID: [name], female   DOB: 11/05/1945, 65 y.o.   MRN: [id number]   Pathology report from colonoscopic biopsy showed poorly to moderately differentiated adenocarcinoma.  Patient is ready for laparoscopic-assisted right hemicolectomy today.  Questions answered.  Hgb is normal today.  [name]. [name], MD, [facility]  05/05/2011 7:33 AM

## 2011-05-05 NOTE — Progress Notes (Addendum)
Patient ID: [name], female   DOB: 29-Sep-1945, 65 y.o.   MRN: [name] Patient ID: [name], female   DOB: 09/12/1945, 65 y.o.   MRN: [name]  [name] is an 65 y.o. female with a history of Breast Cancer S/p Lumpectomy and Radiation therapy to Right Breast (diagnosed in 08/2010 and last treatments in 11/2010) presenting with complaints of worsening weakness and increased falls over past month but worse this past week. She also reports having increased SOB and DOE. She denies having any chest pain. Her last fall resulted in increased pain and bruising of the right eye, and right breast area. She was evaluated in the ED and underwent a CT scan of the Brain which was negative for acute findings, and her laboratory studies revealed abnormalities with a hemoglobin level of 6.7 and a potassium level of 2.8.  Patient also has a history of chronic back pain (after neurosurgery)  and has a pain management doctor.  Consults:  Psychiatry       Gastroenterology, Dr. Watt Climes       General Surgery, Dr. Gershon Crane  Procedures:  Colonoscopy 05/03/11.  Found hepatic mass likely cancer.  Subjective: Patient tearful - leaving her room for surgery.  Expressing regret that she hasn't spent more time with her family over the past year.  Objective: Weight change:   Intake/Output Summary (Last 24 hours) at 05/05/11 0829 Last data filed at 05/04/11 1900  Gross per 24 hour  Intake    855 ml  Output      4 ml  Net    851 ml   Blood pressure 144/80, pulse 59, temperature 97.5 F (36.4 C), temperature source Oral, resp. rate 18, height 5\' 4"  (1.626 m), weight 70.308 kg (155 lb), SpO2 93.00%. Filed Vitals:   05/04/11 1400 05/04/11 2053 05/05/11 0133 05/05/11 0457  BP: 118/77 132/85 153/89 144/80  Pulse: 56 54 54 59  Temp: 98.1 F (36.7 C) 98.3 F (36.8 C) 97.7 F (36.5 C) 97.5 F (36.4 C)  TempSrc: Oral Oral Oral Oral  Resp: 20 19 19 18   Height:      Weight:      SpO2: 95% 96% 98%  93%    Physical Exam: General: Leaving for surgery, stable, tearful, pleasant. Lungs: Clear to auscultation bilaterally without wheezes or crackles.  No work of breathing or SOB Cardiovascular: Regular rate and rhythm without murmur gallop or rub normal S1 and S2 Abdomen: Non tender, non distended, soft, bowel sounds positive, no rebound, no ascites, no appreciable mass Extremities: No significant cyanosis, clubbing, or edema bilateral lower extremities  Basic Metabolic Panel:  Lab [id number] 0530 05/04/11 0620 05/02/11 1145  NA 141 138 --  K 3.7 3.4* --  CL 102 100 --  CO2 30 30 --  GLUCOSE 82 111* --  BUN 11 11 --  CREATININE 1.15* 1.14* --  CALCIUM 9.6 9.6 --  MG -- -- 1.7  PHOS -- -- --   Liver Function Tests:  Lab 04/30/11 2246  AST 29  ALT 18  ALKPHOS 81  BILITOT 0.2*  PROT 6.3  ALBUMIN 3.2*     CBC:  Lab 05/03/11 0522 05/02/11 0834 04/30/11 2246  WBC 6.9 7.3 --  NEUTROABS -- -- 3.3  HGB 11.5* 11.5* --  HCT 36.8 35.1* --  MCV 75.1* 73.9* --  PLT 274 253 --  Coagulation:  Lab 05/02/11 1145  LABPROT 13.6  INR 1.02   Anemia Panel:  Lab 05/01/11 [id number]  VITAMINB12 1095*  FOLATE >20.0  FERRITIN 12  TIBC 438  IRON 12*  RETICCTPCT 1.9    Studies/Results: Scheduled Meds:    . alvimopan  12 mg Oral Once  . anastrozole  1 mg Oral QHS  . buPROPion  300 mg Oral Daily  . ertapenem  1 g Intravenous Once  . feeding supplement  237 mL Oral TID WC  . furosemide  40 mg Oral Daily  . nebivolol  10 mg Oral Daily  . oxyCODONE  40 mg Oral Q12H  . pantoprazole (PROTONIX) IV  40 mg Intravenous Q12H  . potassium chloride  60 mEq Oral Once  . DISCONTD: potassium chloride  20 mEq Oral BID  . DISCONTD: potassium chloride  20 mEq Oral BID   Continuous Infusions:    . sodium chloride 75 mL/hr at 05/05/11 0431  . 0.9 % NaCl with KCl 20 mEq / L Stopped (05/02/11 1859)   PRN Meds:.acetaminophen, acetaminophen, ALPRAZolam, alum & mag hydroxide-simeth,  HYDROmorphone, ondansetron (ZOFRAN) IV, ondansetron, oxyCODONE, zolpidem  Assessment/Plan: Principal Problem:  *Anemia - Iron deficient.  patient has received 3 units of PRBCs.  Hgb stable.  Adenocarinoma of the Colon:  Surgery to remove mass today.  Dr. Humphrey Rolls has been made aware.  CT Chest, abd, pelvis showed no obvious mets.   GI bleed - Please see above.  Anemia and GI Bleed secondary to colon mass.   Hypokalemia - Serum Potassium within normal limits today.   Anxiety and depression - Psychiatry recommends continuing current meds and outpatient follow up. Need to reconsult social work after surgery (after the weekend).  Narcotic dependence and possible abuse - [facility] Dr[facility].    Weakness generalized - thought to be secondary to severe anemia and heavy narcotic / benzo use.   Falls frequently - please see above.    HTN (hypertension) - stable   Lower extremity edema - Resolved on lasix.  Need to monitor creatinine closely.    LOS: 5 days   Melton Alar 05/05/2011, 8:29 AM [phone]

## 2011-05-05 NOTE — Addendum Note (Signed)
Addendum  created 05/05/11 1340 by Sydnee Levans, MD   Modules edited:Orders

## 2011-05-05 NOTE — Op Note (Signed)
Pre-op Diagnosis:  Ascending colon cancer Post-op Diagnosis:  Same Procedure:  Laparoscopic assisted right hemicolectomy Surgeon:  Roann Merk K. Anesthesia:  GETT Indications:  This is a 65 yo female with a recent history of breast cancer who presented with symptomatic anemia.  She underwent colonoscopy and was found to have a large carcinoma in the ascending colon.  We were consulted to see the patient for resection.  Description of procedure:  The patient was brought to the operating room and placed in a supine position on the operating room table.  After an adequate level of anesthesia was obtained, a Foley catheter was placed under sterile technique.  The patient's abdomen was prepped with Chloraprep and draped in sterile fashion.  A 5 mm Optiview trocar was used to cannulate the peritoneal cavity in the left upper quadrant. Pneumoperitoneum was obtained insufflating CO2 maintaining a maximum pressure of 15 mm of Hg.  A 5 mm 30 laparoscope was then inserted. There were no peritoneal implants. There noted abdominal adhesions. We placed a 5 mm port in the subxiphoid position. The cecum appears quite mobile. We can see the tattooed ink from the colonoscopy. We made a 7 cm vertical incision around her umbilicus and inserted a GelPort device. We then mobilized the terminal ileum and the ascending colon using cautery. We continued our mobilization around the hepatic flexure to the mid transverse colon. We then released our pneumoperitoneum and exteriorized the colon. We divided the, ileum 10 cm proximal to the ileocecal valve with a GIA-75 stapler. The tumor was easily palpated in the descending colon near the hepatic flexure. We divided the transverse colon just to the right of the middle colic artery. The is a tear was divided with the LigaSure device. 2 larger vessels were divided between clamps and tied off with 2-0 silk sutures. We then created a side-to-side anastomosis with a GIA-75 stapler. We  inspected the interior the anastomosis which seemed be free of any bleeding. We then closed the enterotomy with a TA 60 stapler. A reinforcing suture of 3-0 silk was placed at the crotch of the anastomosis. The mesenteric defect was closed with interrupted figure-of-eight 2-0 silk sutures. We then placed the specimen back within the abdomen. We were placed a GelPort device and insufflated CO2. We inspected for hemostasis. We irrigated the abdomen thoroughly. Hemostasis was good. Pneumoperitoneum was then released as we removed the trochars and the GelPort device. The fascia was then reapproximated with double-stranded #1 PDS suture. The subcutaneous tissues were irrigated. Staples were then used to close the skin. Dry dressings were applied. The patient was extubated and brought to the recovery room in stable condition. All sponge, instrument, and needle counts are correct.  [name]. [name], MD, [facility]  05/05/2011 12:38 PM

## 2011-05-05 NOTE — Anesthesia Postprocedure Evaluation (Signed)
Anesthesia Post Note  Patient: [name]  Procedure(s) Performed:  LAPAROSCOPIC PARTIAL COLECTOMY - laparoscopic assisted partial colectomy  Anesthesia type: General  Patient location: PACU  Post pain: Pain level controlled and Adequate analgesia  Post assessment: Post-op Vital signs reviewed, Patient's Cardiovascular Status Stable, Respiratory Function Stable, Patent Airway and Pain level controlled  Last Vitals:  Filed Vitals:   05/05/11 1218  BP:   Pulse:   Temp: 36.7 C  Resp:     Post vital signs: Reviewed and stable  Level of consciousness: awake, alert  and oriented  Complications: No apparent anesthesia complications

## 2011-05-05 NOTE — Progress Notes (Signed)
Utilization review complete 

## 2011-05-05 NOTE — Preoperative (Signed)
Beta Blockers   Reason not to administer Beta Blockers:Hold  beta blocker due to Bradycardia (HR less than 50 bpm) 

## 2011-05-05 NOTE — Anesthesia Preprocedure Evaluation (Signed)
Anesthesia Evaluation  Patient identified by MRN, date of birth, ID band Patient awake    Reviewed: Allergy & Precautions, H&P , NPO status , Patient's Chart, lab work & pertinent test results  Airway Mallampati: II  Neck ROM: full    Dental   Pulmonary          Cardiovascular hypertension,     Neuro/Psych PSYCHIATRIC DISORDERS Depression    GI/Hepatic   Endo/Other    Renal/GU      Musculoskeletal   Abdominal   Peds  Hematology   Anesthesia Other Findings   Reproductive/Obstetrics                           Anesthesia Physical Anesthesia Plan  ASA: II  Anesthesia Plan: General   Post-op Pain Management:    Induction: Intravenous  Airway Management Planned: Oral ETT  Additional Equipment:   Intra-op Plan:   Post-operative Plan: Extubation in OR  Informed Consent: I have reviewed the patients History and Physical, chart, labs and discussed the procedure including the risks, benefits and alternatives for the proposed anesthesia with the patient or authorized representative who has indicated his/her understanding and acceptance.     Plan Discussed with: CRNA and Surgeon  Anesthesia Plan Comments:         Anesthesia Quick Evaluation

## 2011-05-05 NOTE — Progress Notes (Signed)
Addendum  Patient seen and examined, chart and data base reviewed.  I agree with the above assessment and plan  For full details please see Mrs. [name] PA. Note.  Status post partial colectomy for invasive adenocarcinoma.  Teon Hudnall A  05/05/2011, 4:28 PM

## 2011-05-06 LAB — CBC
Hemoglobin: 12.7 g/dL (ref 12.0–15.0)
Platelets: 277 10*3/uL (ref 150–400)
RBC: 5.23 MIL/uL — ABNORMAL HIGH (ref 3.87–5.11)
WBC: 13.6 10*3/uL — ABNORMAL HIGH (ref 4.0–10.5)

## 2011-05-06 LAB — BASIC METABOLIC PANEL
GFR calc Af Amer: 64 mL/min — ABNORMAL LOW (ref >90)
GFR calc non Af Amer: 56 mL/min — ABNORMAL LOW (ref >90)
Potassium: 4.6 mEq/L (ref 3.5–5.1)
Sodium: 137 mEq/L (ref 135–145)

## 2011-05-06 MED ORDER — OXYCODONE HCL 20 MG PO TB12
60.0000 mg | ORAL_TABLET | Freq: Three times a day (TID) | ORAL | Status: DC
  Administered 2011-05-06 – 2011-05-09 (×9): 60 mg via ORAL
  Filled 2011-05-06: qty 1
  Filled 2011-05-06 (×8): qty 3
  Filled 2011-05-06: qty 2
  Filled 2011-05-06: qty 3

## 2011-05-06 MED ORDER — PANTOPRAZOLE SODIUM 40 MG PO TBEC
40.0000 mg | DELAYED_RELEASE_TABLET | Freq: Two times a day (BID) | ORAL | Status: DC
  Administered 2011-05-06 – 2011-05-09 (×6): 40 mg via ORAL
  Filled 2011-05-06 (×6): qty 1

## 2011-05-06 MED ORDER — PANTOPRAZOLE SODIUM 40 MG PO TBEC: 40.0000 mg | DELAYED_RELEASE_TABLET | Freq: Two times a day (BID) | ORAL | Status: DC

## 2011-05-06 MED ORDER — PROMETHAZINE HCL 25 MG/ML IJ SOLN
12.5000 mg | Freq: Four times a day (QID) | INTRAMUSCULAR | Status: DC | PRN
  Administered 2011-05-06: 12.5 mg via INTRAVENOUS
  Filled 2011-05-06: qty 1

## 2011-05-06 NOTE — Progress Notes (Signed)
PCA pump discontinued had 43ml dilaudid left in cartridge wasted in sink. Tylor Gambrill,RN/Ginger Gleason, RN

## 2011-05-06 NOTE — Progress Notes (Signed)
I have seen and examined the patient and agree with the assessment and plans.  Samauri Kellenberger A. Tavoris Brisk  MD, FACS  

## 2011-05-06 NOTE — Progress Notes (Signed)
1 Day Post-Op Lap R colon  Entereg protocol Subjective: Pt c/o nausea due to PCA Dilaudid, She states the only thing that works for her is her usual doses of Oxycontin SR 60mg  TID. She reports she has passed a little flatus.   Objective: Vital signs in last 24 hours: Temp:  [97.4 F (36.3 C)-98.6 F (37 C)] 98.6 F (37 C) (12/15 1038) Pulse Rate:  [63-70] 63  (12/15 1038) Resp:  [18-22] 19  (12/15 1038) BP: (154-197)/(80-84) 172/81 mmHg (12/15 1038) SpO2:  [92 %-100 %] 94 % (12/15 1038) FiO2 (%):  [2 %] 2 % (12/14 1700) Last BM Date: 05/03/11  Intake/Output from previous day: 12/14 0701 - 12/15 0700 In: 2933 [I.V.:2883; IV Piggyback:50] Out: 1050 [Urine:1050] Intake/Output this shift:    General appearance: alert, appears older than stated age and mild distress Resp: clear to auscultation bilaterally Cardio: regular rate and rhythm GI: +BS, soft, no significant distention, mildy tender to palpation  Lab Results:   Basename 05/06/11 0600  WBC 13.6*  HGB 12.7  HCT 39.9  PLT 277   BMET  Basename 05/06/11 0600 05/05/11 0530  NA 137 141  K 4.6 3.7  CL 98 102  CO2 28 30  GLUCOSE 136* 82  BUN 11 11  CREATININE 1.04 1.15*  CALCIUM 9.7 9.6   PT/INR No results found for this basename: LABPROT:2,INR:2 in the last 72 hours ABG No results found for this basename: PHART:2,PCO2:2,PO2:2,HCO3:2 in the last 72 hours  Studies/Results: No results found.  Anti-infectives: Anti-infectives     Start     Dose/Rate Route Frequency Ordered Stop   05/05/11 1500   ertapenem (INVANZ) 1 g in sodium chloride 0.9 % 50 mL IVPB        1 g 100 mL/hr over 30 Minutes Intravenous Every 24 hours 05/05/11 1433 05/05/11 1738   05/05/11 0600   ertapenem (INVANZ) 1 g in sodium chloride 0.9 % 50 mL IVPB        1 g 100 mL/hr over 30 Minutes Intravenous  Once 05/04/11 0808 05/05/11 1045          Assessment/Plan: s/p Procedure(s): LAPAROSCOPIC PARTIAL COLECTOMY Advance diet Resume  Oxycontin, DC PCA  LOS: 6 days    Madigan Rosensteel,PA-C Pager [phone] General Trauma Pager [phone]

## 2011-05-06 NOTE — Progress Notes (Signed)
DAILY PROGRESS NOTE                              [facility]  SUBJECTIVE: Has some nausea, still has 5/10 pain. Passing gas no bowel movement.  OBJECTIVE: BP 172/81  Pulse 63  Temp(Src) 98.6 F (37 C) (Oral)  Resp 18  Ht 5\' 4"  (1.626 m)  Wt 70.308 kg (155 lb)  BMI 26.61 kg/m2  SpO2 93%  Intake/Output Summary (Last 24 hours) at 05/06/11 1203 Last data filed at 05/06/11 0644  Gross per 24 hour  Intake   1533 ml  Output    600 ml  Net    933 ml                      Weight change:  Physical Exam: General: Alert and awake oriented x3 not in any acute distress. HEENT: anicteric sclera, pupils equal reactive to light and accommodation CVS: S1-S2 heard, no murmur rubs or gallops Chest: clear to auscultation bilaterally, no wheezing rales or rhonchi Abdomen:  normal bowel sounds, soft, nontender, nondistended, no organomegaly Neuro: Cranial nerves II-XII intact, no focal neurological deficits Extremities: no cyanosis, no clubbing or edema noted bilaterally   Lab Results:  Basename 05/06/11 0600 05/05/11 0530  NA 137 141  K 4.6 3.7  CL 98 102  CO2 28 30  GLUCOSE 136* 82  BUN 11 11  CREATININE 1.04 1.15*  CALCIUM 9.7 9.6  MG -- --  PHOS -- --   Basename 05/06/11 0600  WBC 13.6*  NEUTROABS --  HGB 12.7  HCT 39.9  MCV 76.3*  PLT 277   Medications: Scheduled Meds:   . ALPRAZolam  0.5 mg Oral Once  . alvimopan  12 mg Oral BID  . anastrozole  1 mg Oral QHS  . buPROPion  300 mg Oral Daily  . enoxaparin  40 mg Subcutaneous Q24H  . ertapenem (INVANZ) IV  1 g Intravenous Q24H  . furosemide  40 mg Oral Daily  . nebivolol  10 mg Oral Daily  . ondansetron      . oxyCODONE  60 mg Oral Q8H  . pantoprazole  40 mg Oral BID  . DISCONTD: alvimopan  12 mg Oral Once  . DISCONTD: feeding supplement  237 mL Oral TID WC  . DISCONTD: HYDROmorphone      . DISCONTD: HYDROmorphone PCA 0.3 mg/mL   Intravenous Q4H  . DISCONTD: HYDROmorphone PCA 0.3  mg/mL   Intravenous Q4H  . DISCONTD: HYDROmorphone PCA 0.3 mg/mL   Intravenous Q4H  . DISCONTD: midazolam      . DISCONTD: oxyCODONE  40 mg Oral Q12H  . DISCONTD: pantoprazole  40 mg Oral BID AC  . DISCONTD: pantoprazole (PROTONIX) IV  40 mg Intravenous Q12H   Continuous Infusions:   . dextrose 5 % and 0.45 % NaCl with KCl 20 mEq/L 100 mL/hr at 05/06/11 0644  . DISCONTD: sodium chloride 75 mL/hr at 05/05/11 0431  . DISCONTD: lactated ringers 50 mL/hr at 05/05/11 0914   PRN Meds:.acetaminophen, acetaminophen, ALPRAZolam, diphenhydrAMINE, diphenhydrAMINE, midazolam, naloxone, ondansetron (ZOFRAN) IV, ondansetron (ZOFRAN) IV, ondansetron (ZOFRAN) IV, ondansetron, promethazine, sodium chloride, zolpidem, DISCONTD: 0.9 % irrigation (POUR BTL), DISCONTD: alum & mag hydroxide-simeth, DISCONTD: bupivacaine-epinephrine, DISCONTD: HYDROmorphone, DISCONTD:  HYDROmorphone (DILAUDID) injection, DISCONTD: HYDROmorphone DISCONTD: midazolam, DISCONTD: midazolam, DISCONTD: oxyCODONE  ASSESSMENT & PLAN: Principal Problem:  *Anemia Active Problems:  Weakness generalized  Falls frequently  GI bleed  Hypokalemia  HTN (hypertension)  Anxiety and depression  Lower extremity edema  Colonic mass  Tobacco user  1. Adenocarcinoma of the colon: Status post laparoscopic partial colectomy on 05/05/2011. General surgery is following patient is on clear liquids to be advanced by general surgery. Still have pain the PCA switched to a her regular OxyContin.  2. Anemia: Iron deficiency anemia patient received 3 units of pRBCs, it was likely secondary to chronic GI bleed from the adenocarcinoma. Hemoglobin is stable now  3. GI bleed: This is chronic probably secondary to the adenocarcinoma.  4. Anxiety/depression: Continue current medication, patient seen by psychiatry recommended outpatient followup.  5. 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 Hardin Negus of pain management clinic, patient to  followup as outpatient with them. She is back on her home medication does.    LOS: 6 days   [name] A 05/06/2011, 12:03 PM

## 2011-05-07 LAB — BASIC METABOLIC PANEL
CO2: 32 mEq/L (ref 19–32)
Glucose, Bld: 122 mg/dL — ABNORMAL HIGH (ref 70–99)
Potassium: 3.5 mEq/L (ref 3.5–5.1)
Sodium: 138 mEq/L (ref 135–145)

## 2011-05-07 MED ORDER — KETOROLAC TROMETHAMINE 30 MG/ML IJ SOLN
30.0000 mg | Freq: Four times a day (QID) | INTRAMUSCULAR | Status: DC
  Administered 2011-05-07 – 2011-05-09 (×7): 30 mg via INTRAVENOUS
  Filled 2011-05-07 (×12): qty 1

## 2011-05-07 MED ORDER — POTASSIUM CHLORIDE CRYS ER 20 MEQ PO TBCR
60.0000 meq | EXTENDED_RELEASE_TABLET | Freq: Once | ORAL | Status: AC
  Administered 2011-05-07: 60 meq via ORAL
  Filled 2011-05-07: qty 3

## 2011-05-07 MED ORDER — HYDROMORPHONE HCL PF 1 MG/ML IJ SOLN
1.0000 mg | INTRAMUSCULAR | Status: DC | PRN
  Administered 2011-05-07 – 2011-05-08 (×2): 1 mg via INTRAVENOUS
  Filled 2011-05-07 (×2): qty 1

## 2011-05-07 MED ORDER — OXYCODONE HCL 5 MG PO TABS
10.0000 mg | ORAL_TABLET | ORAL | Status: DC | PRN
  Administered 2011-05-09: 10 mg via ORAL
  Filled 2011-05-07: qty 2

## 2011-05-07 NOTE — Progress Notes (Signed)
DAILY PROGRESS NOTE                              [facility]AL [facility]  SUBJECTIVE: Passes gas, no BMs. Reports uncontrolled pain.  OBJECTIVE: BP 161/68  Pulse 59  Temp(Src) 97.5 F (36.4 C) (Axillary)  Resp 19  Ht 5\' 4"  (1.626 m)  Wt 70.308 kg (155 lb)  BMI 26.61 kg/m2  SpO2 97%  Intake/Output Summary (Last 24 hours) at 05/07/11 1050 Last data filed at 05/07/11 0700  Gross per 24 hour  Intake 2426.67 ml  Output   2400 ml  Net  26.67 ml                      Weight change:  Physical Exam: General: Alert and awake oriented x3 not in any acute distress. HEENT: anicteric sclera, pupils equal reactive to light and accommodation CVS: S1-S2 heard, no murmur rubs or gallops Chest: clear to auscultation bilaterally, no wheezing rales or rhonchi Abdomen:  normal bowel sounds, soft, nontender, nondistended, no organomegaly Neuro: Cranial nerves II-XII intact, no focal neurological deficits Extremities: no cyanosis, no clubbing or edema noted bilaterally   Lab Results:  Basename 05/07/11 0720 05/06/11 0600  NA 138 137  K 3.5 4.6  CL 96 98  CO2 32 28  GLUCOSE 122* 136*  BUN 10 11  CREATININE 1.10 1.04  CALCIUM 10.2 9.7  MG -- --  PHOS -- --    Basename 05/06/11 0600  WBC 13.6*  NEUTROABS --  HGB 12.7  HCT 39.9  MCV 76.3*  PLT 277   Medications: Scheduled Meds:    . ALPRAZolam  0.5 mg Oral Once  . alvimopan  12 mg Oral BID  . anastrozole  1 mg Oral QHS  . buPROPion  300 mg Oral Daily  . enoxaparin  40 mg Subcutaneous Q24H  . furosemide  40 mg Oral Daily  . ketorolac  30 mg Intravenous Q6H  . nebivolol  10 mg Oral Daily  . oxyCODONE  60 mg Oral Q8H  . pantoprazole  40 mg Oral BID  . DISCONTD: HYDROmorphone PCA 0.3 mg/mL   Intravenous Q4H   Continuous Infusions:    . dextrose 5 % and 0.45 % NaCl with KCl 20 mEq/L 100 mL/hr at 05/07/11 1020   PRN Meds:.acetaminophen, acetaminophen, ALPRAZolam, diphenhydrAMINE, diphenhydrAMINE,  HYDROmorphone (DILAUDID) injection, midazolam, naloxone, ondansetron (ZOFRAN) IV, ondansetron (ZOFRAN) IV, ondansetron, promethazine, sodium chloride, zolpidem  ASSESSMENT & PLAN: Principal Problem:  *Anemia Active Problems:  Weakness generalized  Falls frequently  GI bleed  Hypokalemia  HTN (hypertension)  Anxiety and depression  Lower extremity edema  Colonic mass  Tobacco user  1. Adenocarcinoma of the colon: Status post laparoscopic partial colectomy on 05/05/2011. General surgery is following patient is on clear liquids to be advanced by general surgery. Still have pain the PCA switched to a her regular OxyContin. Dilaudid added for breakthrough pain, she reported no relieve, I will add Oxy IR.  2. Anemia: Iron deficiency anemia patient received 3 units of pRBCs, it was likely secondary to chronic GI bleed from the adenocarcinoma. Hemoglobin is stable now  3. GI bleed: This is chronic probably secondary to the adenocarcinoma.  4. Anxiety/depression: Continue current medication, patient seen by psychiatry recommended outpatient followup.  5. 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 Hardin Negus of pain management clinic, patient to followup as outpatient with them. She is back on  her home medication does.    LOS: 7 days   Calliope Delangel A 05/07/2011, 10:50 AM

## 2011-05-07 NOTE — Progress Notes (Signed)
Foley discontinued per order.  500 cc of urine output.

## 2011-05-07 NOTE — Progress Notes (Signed)
2 Days Post-Op laparoscopic right hemicolectomy Subjective: Patient reports considerable incisional pain, not well-controlled with PO pain meds.  Her pain control is difficult since she has been on chronic PO narcotics for some time, even before surgery, and now she is having additional incisional pain after her colon resection.  Otherwise, she reports minimal flatus, but no nausea.  She doesn't like the smell of the broth on the clear liquid trays, but is drinking water without difficulty.  Objective: Vital signs in last 24 hours: Temp:  [97.5 F (36.4 C)-98.6 F (37 C)] 97.5 F (36.4 C) (12/16 0535) Pulse Rate:  [58-70] 59  (12/16 0535) Resp:  [18-20] 19  (12/16 0535) BP: (161-184)/(68-82) 161/68 mmHg (12/16 0535) SpO2:  [91 %-97 %] 97 % (12/16 0535) Last BM Date: 05/03/11  Intake/Output from previous day: 12/15 0701 - 12/16 0700 In: 2426.7 [I.V.:2426.7] Out: 2400 [Urine:2400] Intake/Output this shift:    General appearance: alert and mild distress GI: Hypoactive bowel sounds, mildly distended Staple line intact; port sites free of infection  Lab Results:   Basename 05/06/11 0600  WBC 13.6*  HGB 12.7  HCT 39.9  PLT 277   BMET  Basename 05/06/11 0600 05/05/11 0530  NA 137 141  K 4.6 3.7  CL 98 102  CO2 28 30  GLUCOSE 136* 82  BUN 11 11  CREATININE 1.04 1.15*  CALCIUM 9.7 9.6   PT/INR No results found for this basename: LABPROT:2,INR:2 in the last 72 hours ABG No results found for this basename: PHART:2,PCO2:2,PO2:2,HCO3:2 in the last 72 hours  Studies/Results: No results found.  Anti-infectives: Anti-infectives     Start     Dose/Rate Route Frequency Ordered Stop   05/05/11 1500   ertapenem (INVANZ) 1 g in sodium chloride 0.9 % 50 mL IVPB        1 g 100 mL/hr over 30 Minutes Intravenous Every 24 hours 05/05/11 1433 05/05/11 1738   05/05/11 0600   ertapenem (INVANZ) 1 g in sodium chloride 0.9 % 50 mL IVPB        1 g 100 mL/hr over 30 Minutes  Intravenous  Once 05/04/11 0808 05/05/11 1045          Assessment/Plan: s/p Procedure(s): LAPAROSCOPIC PARTIAL COLECTOMY Ambulate, adjust pain medicines - will add Toradol and use IV Dilaudid as breakthrough coverage.  Continue PO pain regimen Awaiting pathology report  LOS: 7 days    Britt Petroni K. 05/07/2011

## 2011-05-08 LAB — BASIC METABOLIC PANEL
BUN: 13 mg/dL (ref 6–23)
Chloride: 100 mEq/L (ref 96–112)
GFR calc Af Amer: 52 mL/min — ABNORMAL LOW (ref >90)
Glucose, Bld: 99 mg/dL (ref 70–99)
Potassium: 4 mEq/L (ref 3.5–5.1)
Sodium: 137 mEq/L (ref 135–145)

## 2011-05-08 MED ORDER — SODIUM CHLORIDE 0.9 % IV SOLN
INTRAVENOUS | Status: DC
  Administered 2011-05-08 – 2011-05-09 (×3): via INTRAVENOUS

## 2011-05-08 MED ORDER — POTASSIUM CHLORIDE CRYS ER 20 MEQ PO TBCR
40.0000 meq | EXTENDED_RELEASE_TABLET | ORAL | Status: AC
  Administered 2011-05-08: 40 meq via ORAL
  Filled 2011-05-08: qty 2

## 2011-05-08 NOTE — Progress Notes (Signed)
Seen and agree  

## 2011-05-08 NOTE — Progress Notes (Signed)
Seen and agree with SPT Note Lanetta Inch, [place]

## 2011-05-08 NOTE — Progress Notes (Signed)
3 Days Post-Op  Subjective: Pt ok. Reports having several BMs. Denies N/V and states pain control better this am. Has been OOB, walking. Pushing to go home later today  Objective: Vital signs in last 24 hours: Temp:  [97.3 F (36.3 C)-97.9 F (36.6 C)] 97.7 F (36.5 C) (12/17 0555) Pulse Rate:  [51-57] 57  (12/17 0555) Resp:  [18] 18  (12/17 0555) BP: (123-134)/(70-81) 134/81 mmHg (12/17 0555) SpO2:  [91 %-95 %] 95 % (12/17 0555) Last BM Date: 05/07/11  Intake/Output this shift:    Physical Exam: BP 134/81  Pulse 57  Temp(Src) 97.7 F (36.5 C) (Oral)  Resp 18  Ht 5\' 4"  (1.626 m)  Wt 155 lb (70.308 kg)  BMI 26.61 kg/m2  SpO2 95% Lungs: CTA without w/r/r Heart: Regular Abdomen: soft, flat,  ND, appropriately tender   Incisions all c/d/i without erythema or hematoma. Ext: No edema or tenderness   Labs: CBC  Basename 05/06/11 0600  WBC 13.6*  HGB 12.7  HCT 39.9  PLT 277   BMET  Basename 05/08/11 0500 05/07/11 0720  NA 137 138  K 4.0 3.5  CL 100 96  CO2 28 32  GLUCOSE 99 122*  BUN 13 10  CREATININE 1.25* 1.10  CALCIUM 9.9 10.2   LFT No results found for this basename: PROT,ALBUMIN,AST,ALT,ALKPHOS,BILITOT,BILIDIR,IBILI,LIPASE in the last 72 hours PT/INR No results found for this basename: LABPROT:2,INR:2 in the last 72 hours ABG No results found for this basename: PHART:2,PCO2:2,PO2:2,HCO3:2 in the last 72 hours  Studies/Results: No results found.  Assessment: Principal Problem:  *Anemia Active Problems:  Weakness generalized  Falls frequently  GI bleed  Hypokalemia  HTN (hypertension)  Anxiety and depression  Lower extremity edema  Colonic mass  Tobacco user   Procedure(s): LAPAROSCOPIC PARTIAL COLECTOMY  Plan: Advance diet. PO pain control.  LOS: 8 days    Federico Flake 05/08/2011

## 2011-05-08 NOTE — Progress Notes (Signed)
Physical Therapy Treatment Patient Details Name: [name] MRN: [id number] DOB: January 17, 1946 Today's Date: 05/08/2011  PT Assessment/Plan  PT - Assessment/Plan Comments on Treatment Session: Patient did well during today's treatment session and was able to increase ambulation distance with no assistive device.  Patient not wanting to attempt stairs due to having already walked around a lot today with nursing and because her son arrived and she wanted to visit with him on his birthday.   PT Plan: Discharge plan needs to be updated PT Frequency: Min 3X/week Follow Up Recommendations: None Equipment Recommended: None recommended by PT PT Goals  Acute Rehab PT Goals PT Goal: Sit to Stand - Progress: Met PT Goal: Stand to Sit - Progress: Met Pt will Ambulate: >150 feet;Independently PT Goal: Ambulate - Progress: Revised (modified due to lack of progress/goal met) PT Goal: Up/Down Stairs - Progress: Other (comment) (Not addressed today. )  PT Treatment Precautions/Restrictions  Precautions Precautions: Fall Precaution Comments: multiple h/o falls Restrictions Weight Bearing Restrictions: No Mobility (including Balance) Bed Mobility Bed Mobility: No Transfers Transfers: Yes Sit to Stand: 7: Independent;From bed Stand to Sit: 7: Independent;To bed Ambulation/Gait Ambulation/Gait: Yes Ambulation/Gait Assistance: 5: Supervision Ambulation/Gait Assistance Details (indicate cue type and reason): Supervision for safety.  Ambulation Distance (Feet): 250 Feet Assistive device: None Gait Pattern: Within Functional Limits Stairs: No    Exercise    End of Session PT - End of Session Activity Tolerance: Patient tolerated treatment well;Other (comment) (Pt. self-limiting and wanting to visit with her son) Patient left: in bed;with call bell in reach;with family/visitor present Nurse Communication: Mobility status for transfers;Mobility status for ambulation General Behavior  During Session: [facility] for tasks performed Cognition: [facility] for tasks performed  Barney Drain, SPT  05/08/2011, 5:07 PM

## 2011-05-08 NOTE — Progress Notes (Signed)
DAILY PROGRESS NOTE                              [facility]  SUBJECTIVE: Had 4-5 bowel movements since yesterday. Patient is less than yesterday with oral narcotics.  OBJECTIVE: BP 134/81  Pulse 57  Temp(Src) 97.7 F (36.5 C) (Oral)  Resp 18  Ht 5\' 4"  (1.626 m)  Wt 70.308 kg (155 lb)  BMI 26.61 kg/m2  SpO2 95%  Intake/Output Summary (Last 24 hours) at 05/08/11 1252 Last data filed at 05/08/11 0600  Gross per 24 hour  Intake 2693.33 ml  Output      0 ml  Net 2693.33 ml                      Weight change:  Physical Exam: General: Alert and awake oriented x3 not in any acute distress. HEENT: anicteric sclera, pupils equal reactive to light and accommodation CVS: S1-S2 heard, no murmur rubs or gallops Chest: clear to auscultation bilaterally, no wheezing rales or rhonchi Abdomen:  normal bowel sounds, soft, nontender, nondistended, no organomegaly Neuro: Cranial nerves II-XII intact, no focal neurological deficits Extremities: no cyanosis, no clubbing or edema noted bilaterally   Lab Results:  Basename 05/08/11 0500 05/07/11 0720  NA 137 138  K 4.0 3.5  CL 100 96  CO2 28 32  GLUCOSE 99 122*  BUN 13 10  CREATININE 1.25* 1.10  CALCIUM 9.9 10.2  MG -- --  PHOS -- --    Basename 05/06/11 0600  WBC 13.6*  NEUTROABS --  HGB 12.7  HCT 39.9  MCV 76.3*  PLT 277   Medications: Scheduled Meds:    . ALPRAZolam  0.5 mg Oral Once  . alvimopan  12 mg Oral BID  . anastrozole  1 mg Oral QHS  . buPROPion  300 mg Oral Daily  . enoxaparin  40 mg Subcutaneous Q24H  . ketorolac  30 mg Intravenous Q6H  . nebivolol  10 mg Oral Daily  . oxyCODONE  60 mg Oral Q8H  . pantoprazole  40 mg Oral BID   Continuous Infusions:    . dextrose 5 % and 0.45 % NaCl with KCl 20 mEq/L 100 mL/hr at 05/07/11 1932   PRN Meds:.acetaminophen, acetaminophen, ALPRAZolam, HYDROmorphone (DILAUDID) injection, midazolam, ondansetron (ZOFRAN) IV, ondansetron,  oxyCODONE, promethazine, zolpidem  ASSESSMENT & PLAN: Principal Problem:  *Anemia Active Problems:  Weakness generalized  Falls frequently  GI bleed  Hypokalemia  HTN (hypertension)  Anxiety and depression  Lower extremity edema  Colonic mass  Tobacco user  1. Adenocarcinoma of the colon: Status post laparoscopic partial colectomy on 05/05/2011. General surgery is following patient is on clear liquids to be advanced by general surgery. Patient died was advanced to full liquids and then advance as tolerated. Expected to be discharged in the morning if she tolerated regular diet. Pain is controlled with oral OxyContin and OxyIR.  2. Anemia: Iron deficiency anemia patient received 3 units of pRBCs, it was likely secondary to chronic GI bleed from the adenocarcinoma. Hemoglobin is stable now  3. GI bleed: This is chronic probably secondary to the adenocarcinoma.  4. Anxiety/depression: Continue current medication, patient seen by psychiatry recommended outpatient followup.  5. 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 Hardin Negus of pain management clinic, patient to followup as outpatient with them. She is back on her home medication does.    LOS: 8 days  Tasharra Nodine A 05/08/2011, 12:52 PM

## 2011-05-09 ENCOUNTER — Telehealth: Payer: Self-pay | Admitting: *Deleted

## 2011-05-09 ENCOUNTER — Encounter: Payer: Self-pay | Admitting: *Deleted

## 2011-05-09 LAB — BASIC METABOLIC PANEL
BUN: 16 mg/dL (ref 6–23)
CO2: 26 mEq/L (ref 19–32)
Calcium: 9.8 mg/dL (ref 8.4–10.5)
Chloride: 103 mEq/L (ref 96–112)
Creatinine, Ser: 1.49 mg/dL — ABNORMAL HIGH (ref 0.50–1.10)
GFR calc Af Amer: 42 mL/min — ABNORMAL LOW (ref >90)
GFR calc non Af Amer: 36 mL/min — ABNORMAL LOW (ref >90)
Glucose, Bld: 73 mg/dL (ref 70–99)
Potassium: 4.3 mEq/L (ref 3.5–5.1)
Sodium: 137 mEq/L (ref 135–145)

## 2011-05-09 MED ORDER — FUROSEMIDE 20 MG PO TABS: 40.0000 mg | ORAL_TABLET | Freq: Two times a day (BID) | ORAL | Status: DC

## 2011-05-09 MED ORDER — ALVIMOPAN 12 MG PO CAPS: 12.0000 mg | ORAL_CAPSULE | Freq: Two times a day (BID) | ORAL | Status: AC

## 2011-05-09 MED ORDER — ALPRAZOLAM 0.5 MG PO TABS: 0.5000 mg | ORAL_TABLET | Freq: Two times a day (BID) | ORAL | Status: DC | PRN

## 2011-05-09 NOTE — Discharge Summary (Signed)
Addendum  Patient seen and examined, chart and database reviewed.  I agree with the above assessment and plan  For full details please see Mrs. [name] PA. D/C note.  Colon cancer S/P resection, F/U with Her Oncologist, Gen. Surgeon as out patient.  Margurete Guaman A  05/09/2011, 11:14 AM

## 2011-05-09 NOTE — Telephone Encounter (Signed)
[place] clled pt will be discharged today. Requesting follow up visit with MD. Reviewed with Dr. Humphrey Rolls pt to be seen for Hospital Follow up on May 24 2011 at [place] Sched sent

## 2011-05-09 NOTE — Progress Notes (Signed)
4 Days Post-Op  Subjective: Pt ok. Anxious to go home. Now eating reg diet. Had good BM this am. Says she voiding well too. Pain control adequate.  Objective: Vital signs in last 24 hours: Temp:  [97.6 F (36.4 C)-97.8 F (36.6 C)] 97.6 F (36.4 C) (12/18 0515) Pulse Rate:  [60-69] 60  (12/18 0515) Resp:  [18] 18  (12/18 0515) BP: (130-156)/(66-77) 155/68 mmHg (12/18 0515) SpO2:  [97 %-99 %] 99 % (12/18 0515) Last BM Date: 05/08/11  Intake/Output this shift:    Physical Exam: BP 155/68  Pulse 60  Temp(Src) 97.6 F (36.4 C) (Oral)  Resp 18  Ht 5\' 4"  (1.626 m)  Wt 155 lb (70.308 kg)  BMI 26.61 kg/m2  SpO2 99% Lungs: CTA without w/r/r Heart: Regular Abdomen: soft, ND, appropriately tender   Incisions all c/d/i without erythema or hematoma. Ext: No edema or tenderness   Labs: CBC No results found for this basename: WBC:2,HGB:2,HCT:2,PLT:2 in the last 72 hours BMET  Basename 05/09/11 0537 05/08/11 0500  NA 137 137  K 4.3 4.0  CL 103 100  CO2 26 28  GLUCOSE 73 99  BUN 16 13  CREATININE 1.49* 1.25*  CALCIUM 9.8 9.9   LFT No results found for this basename: PROT,ALBUMIN,AST,ALT,ALKPHOS,BILITOT,BILIDIR,IBILI,LIPASE in the last 72 hours PT/INR No results found for this basename: LABPROT:2,INR:2 in the last 72 hours ABG No results found for this basename: PHART:2,PCO2:2,PO2:2,HCO3:2 in the last 72 hours  Studies/Results: No results found.  Assessment: Principal Problem:  *Anemia Active Problems:  Weakness generalized  Falls frequently  GI bleed  Hypokalemia  HTN (hypertension)  Anxiety and depression  Lower extremity edema  Colonic mass  Tobacco user   Procedure(s): LAPAROSCOPIC PARTIAL COLECTOMY  Plan: Looks good from surgical standing, CR creeping up. OK to DC when medically stable.  LOS: 9 days    [name] 05/09/2011

## 2011-05-09 NOTE — Discharge Summary (Signed)
Patient ID: [name] MRN: [name] DOB/AGE: 1946-02-06 65 y.o.  Admit date: 04/30/2011 Discharge date: 05/09/2011  Primary Care Physician:  Kristine Garbe, MD  Discharge Diagnoses:    Present on Admission:  .Weakness generalized .Anxiety and depression .Lower extremity edema *Adenocarcinoma of the colon *Recent history of Breast Cancer *Microcytic Anemia *GI Bleed *Hypokalemia *HTN *Lower extremity edema *Tobacco   Current Discharge Medication List    START taking these medications   Details  alvimopan (ENTEREG) 12 MG capsule Take 1 capsule (12 mg total) by mouth 2 (two) times daily. Qty: 15 capsule, Refills: 0      CONTINUE these medications which have CHANGED   Details  ALPRAZolam (XANAX) 0.5 MG tablet Take 1 tablet (0.5 mg total) by mouth 2 (two) times daily as needed for anxiety. For anxiety Qty: 1 tablet, Refills: 0    furosemide (LASIX) 20 MG tablet Take 2 tablets (40 mg total) by mouth 2 (two) times daily. Take two tablets once daily if needed for leg swelling. Qty: 1 tablet, Refills: 0      CONTINUE these medications which have NOT CHANGED   Details  anastrozole (ARIMIDEX) 1 MG tablet Take 1 mg by mouth at bedtime.      buPROPion (WELLBUTRIN XL) 300 MG 24 hr tablet Take 300 mg by mouth daily.      fluvoxaMINE (LUVOX) 50 MG tablet Take 50-100 mg by mouth at bedtime. 1 tab in the am, 2 tabs in the pm     nebivolol (BYSTOLIC) 10 MG tablet Take 10 mg by mouth daily.      Oxycodone HCl (OXYCONTIN) 60 MG TB12 Take 1 tablet by mouth 3 (three) times daily as needed. For pain     QUEtiapine (SEROQUEL) 50 MG tablet Take 50 mg by mouth at bedtime.          Consults:  1.  Dr. Marlou Sa, Psychiatry 05/02/11   2.  Dr. Arta Silence, Gastroenterology 05/01/11   3.  Dr. Donnie Mesa, General Surgery, 05/03/11   4.  Dr. Deatra Robinson, Oncology, 05/03/11  Procedures:   1.  Blood Transfusion:  3 Units PRBCs 2.  Colonoscopy 05/03/11 3.   Laproscopic Right Hemicolectomy  05/05/11  Brief H and P: From the admission note:  [name] is an 65 y.o. female with a history of Breast Cancer S/p Lumpectomy and Radiation therapy to Right Breast (diagnosed in 08/2010 and last treatments in 11/2010) presenting with complaints of worsening weakness and increased falls over past month but worse this past week. She also reports having increased SOB and DOE. She denies having any chest pain. Her last fall resulted in increased pain and bruising of the right eye, and right breast area. She was evaluated in the ED and underwent a CT scan of the Brain which was negative for acute findings, and her laboratory studies revealed abnormalities with a hemoglobin level of 6.7 and a potassium level of 2.8. Patient denies having nay nausea vomiting or diarrhea, and denies hematemesis, hematochezia, and melena. A rectal examination was performed Buy the EDP which was found to be Heme +.   [name] received a blood transfusion of 3 units packed red blood cells after which her hemoglobin responded appropriately and remained stable throughout the rest of the hospitalization.  With regard to her falls and altered mental status this was felt to be due to the combination of extremely low hemoglobin and heavy narcotic use and benzodiazepine use. Psychiatric consultation was sought. The  psychiatrist continued [name] on her previous home medications including Xanax and bupropion.  No changes were made.    A 2-D echocardiogram was checked due to lower extremity swelling and found to be within normal limits.  A colonoscopy was performed on December 10, a large colon mass was found in the hepatic flexure. Pathology revealed poorly differentiated invasive adenocarcinoma. The patient was taken to surgery on December 12 and had a laparoscopic right Hemicolectomy. She was placed on Entereg as she takes large amounts of narcotic pain medications chronically. After her  hemicolectomy she did remarkably well quickly advancing to a regular diet, and able to cannulate up and down the hallways. Today she is stable eating solid food and ready for discharge.  With regard to family dynamics and prescribed pain medication and benzodiazepine use multiple family members insisted that the patient overuses her narcotics and Xanax. Originally we were able to cut her Xanax back to twice a day and her OxyContin down to 40 mg q. 12 hours. However, after surgery her home doses were restored and the patient is now on 0.5 mg of Xanax 3 times a day and OxyContin 60 mg q. 8 hours.  We have requested that she followup with her pain management physician, Dr. Hardin Negus, and her psychiatrist Dr. Dorethea Clan.  At the time of discharge one remaining concern is the patient's slowly rising creatinine it is now at 1.5. We have advised the patient not to take ibuprofen or Tylenol and to half her dose of Lasix until she follows up with Dr. Ayesha Rumpf.    Physical Exam on Discharge:   Filed Vitals:   05/08/11 0555 05/08/11 1500 05/08/11 2100 05/09/11 0515  BP: 134/81 130/77 156/66 155/68  Pulse: 57 60 69 60  Temp: 97.7 F (36.5 C) 97.8 F (36.6 C) 97.6 F (36.4 C) 97.6 F (36.4 C)  TempSrc: Oral Oral  Oral  Resp: 18 18 18 18   Height:      Weight:      SpO2: 95% 97% 98% 99%     Intake/Output Summary (Last 24 hours) at 05/09/11 1001 Last data filed at 05/09/11 0000  Gross per 24 hour  Intake 2066.66 ml  Output      0 ml  Net 2066.66 ml    Basic Metabolic Panel:  Lab [id number] 05/08/11 0500 05/02/11 1145  NA 137 137 --  K 4.3 4.0 --  CL 103 100 --  CO2 26 28 --  GLUCOSE 73 99 --  BUN 16 13 --  CREATININE 1.49* 1.25* --  CALCIUM 9.8 9.9 --  MG -- -- 1.7  PHOS -- -- --   CBC:  Lab 05/06/11 0600 05/03/11 0522  WBC 13.6* 6.9  NEUTROABS -- --  HGB 12.7 11.5*  HCT 39.9 36.8  MCV 76.3* 75.1*  PLT 277 274     Lab 05/02/11 1145  LABPROT 13.6  INR 1.02    Other  Pertinent Results:  Pathology from colonoscopy significant for poorly differentiated invasive adenocarcinoma of the hepatic flexure.    Pathology from Right Hemicolectomy procedure is still outstanding.  Significant Diagnostic Studies:  CT CHEST  Findings: Mild centrilobular emphysematous changes. Mild subpleural reticulation in the anterior right upper lobe, likely  reflecting radiation changes. Small right pleural effusion with associated lower lobe atelectasis. The heart is normal in size. No pericardial effusion. Coronary atherosclerosis. Atherosclerotic calcifications of the aortic arch.  No suspicious mediastinal, hilar, or axillary lymphadenopathy. Postsurgical changes in the lateral right breast. Overlying skin  thickening, likely reflecting prior radiation. Moderate degenerative changes of the thoracic spine. Old right posterolateral rib deformities. Bilateral shoulder arthroplasties. No focal osseous lesions.  IMPRESSION:  Postsurgical changes in the lateral right breast. Postradiation changes in the right breast and anterior right upper lobe.  No evidence of recurrent or metastatic disease in the chest. Mild centrilobular emphysematous changes.   CT ABDOMEN AND PELVIS  Findings: Tiny hypoenhancing lesion in the right hepatic lobe (series 2/image 51), too small to characterize but measuring simple  fluid density and likely benign. Spleen, pancreas, and adrenal glands are within normal limits. Gallbladder is unremarkable. No intrahepatic or extrahepatic ductal dilatation. Kidneys are unremarkable. No hydronephrosis. No evidence of bowel obstruction. Normal appendix. 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 Mild wall thickening involving a loop of sigmoid colon (series 2/image 101), possibly reflecting underdistension versus muscular hypertrophy secondary to diverticulosis. No  suspicious abdominopelvic lymphadenopathy. Atherosclerotic calcifications of the abdominal aorta and branch vessels. Small volume pelvic ascites.  Status post hysterectomy. No adnexal masses. Moderate degenerative changes of the lumbar spine. Status post PLIF at L2-3 and L4-5. No focal osseous lesions. 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 No evidence of metastatic disease in the abdomen/pelvis.  Dg Chest 2 View  04/30/2011  *RADIOLOGY REPORT*  Clinical Data: Altered mental status.  Right-sided chest pain. History of breast cancer and radiation.  CHEST - 2 VIEW    IMPRESSION: Overall stable appearance of the chest since previous study. Emphysematous changes and scattered fibrosis.  Blunting of the right costophrenic angle suggesting fluid or thickened pleura. Ct Head Wo Contrast  04/30/2011  *RADIOLOGY REPORT*  Clinical Data: Altered mental status.  Syncope.  Abrasion to side the right eye, possibly from fall.  CT HEAD WITHOUT CONTRAST   IMPRESSION: Chronic atrophy and small vessel ischemic changes.  No evidence of acute intracranial hemorrhage, mass lesion, or acute infarct.  Original Report Authenticated By: Neale Burly, M.D.   2D Echocardiogram:  Study Conclusions  - Left ventricle: The cavity size was normal. Wall thickness was normal. Systolic function was normal. The estimated ejection fraction was in the range of 55% to 60%. Doppler parameters are consistent with abnormal left ventricular relaxation (grade 1 diastolic dysfunction). The E/e' ratio is >10, suggesting elevated LV filling pressure. - Mitral valve: Calcified annulus. Mildly thickened leaflets . Mild regurgitation. - Left atrium: The atrium was mildly dilated. - Tricuspid valve: Mild eccentric regurgitation. - Pulmonary arteries: PA peak pressure: 104mm Hg (S).  Disposition and Follow-up: Discharge to home in stable condition.  Eating well, able to ambulate easily.  Patient has multiple followup appointments: 1.  followup with Dr. Irven Shelling primary care physician within 7-10 days. Please check serum creatinine. 2.  follow up with Dr. Arta Silence of [facility] gastroenterology 3.  follow up with Dr. Donnie Mesa of [facility] in one week 4.  Follow up with [name] - their office will call with appointment. 5. Follow up with Dr. [name], Psychiatry as previously scheduled. 6.  Follow up with Dr. Hardin Negus, Pain Management as previously scheduled.   Time spent on Discharge: 45 MIN  Signed: Melton Alar 05/09/2011, 10:01 AM [phone]

## 2011-05-09 NOTE — Progress Notes (Signed)
IV removed, pressure and dressing applied, site unremarkable.  Discharge instructions, prescriptions, and follow-up appointments given.  All belongings sent with pt and all questions answered.  Pt transported in wheelchair by volunteer and traveled home with family by private vehicle.

## 2011-05-10 ENCOUNTER — Telehealth: Payer: Self-pay | Admitting: *Deleted

## 2011-05-10 NOTE — Telephone Encounter (Signed)
Phone call to Pathology to confirm pathology from 05/05/11 was sent for MSI testing, per request of Dr. Humphrey Rolls.  It was sent on 06/08/10 per Christiana Pellant.

## 2011-05-10 NOTE — Progress Notes (Signed)
Quick Note:  Please call the patient and let them know that their pathology showed that there was no cancer in the lymph nodes. ______

## 2011-05-10 NOTE — Telephone Encounter (Signed)
patient confirmed over the phone the new date and time on 05-24-2011 starting at 2:30pm

## 2011-05-10 NOTE — Progress Notes (Signed)
c  CARE MANAGEMENT NOTE 05/10/2011  Patient:  [name], [name]   Account Number:  [id number]  Date Initiated:  05/01/2011  Documentation initiated by:  Tomi Bamberger  Subjective/Objective Assessment:   dx anemia  admit- lives with family members.     Action/Plan:   Anticipated DC Date:  05/09/2011   Anticipated DC Plan:  [place]  CM consult      Choice offered to / List presented to:             Status of service:  Completed, signed off Medicare Important Message given?   (If response is "NO", the following Medicare IM given date fields will be blank) Date Medicare IM given:   Date Additional Medicare IM given:    Discharge Disposition:  HOME/SELF CARE  Per UR Regulation:    Comments:  05/05/11- 1645- Marvetta Gibbons RN, BSN [phone] Pt for OR today for hemicolectomy, CM to follow post-op for any potential d/c needs  05/01/11 17:01 Tomi Bamberger RN, BSN [phone] patient lives with family members, NCM will continue to follow for dc needs.

## 2011-05-18 ENCOUNTER — Telehealth: Payer: Self-pay | Admitting: Oncology

## 2011-05-18 NOTE — Telephone Encounter (Signed)
Hospital f/u Dx- Colon CA

## 2011-05-19 ENCOUNTER — Telehealth (INDEPENDENT_AMBULATORY_CARE_PROVIDER_SITE_OTHER): Payer: Self-pay | Admitting: Surgery

## 2011-05-19 ENCOUNTER — Ambulatory Visit (INDEPENDENT_AMBULATORY_CARE_PROVIDER_SITE_OTHER): Payer: PRIVATE HEALTH INSURANCE | Admitting: General Surgery

## 2011-05-19 DIAGNOSIS — Z433 Encounter for attention to colostomy: Secondary | ICD-10-CM

## 2011-05-24 ENCOUNTER — Ambulatory Visit (HOSPITAL_BASED_OUTPATIENT_CLINIC_OR_DEPARTMENT_OTHER): Payer: PRIVATE HEALTH INSURANCE | Admitting: Oncology

## 2011-05-24 ENCOUNTER — Other Ambulatory Visit (HOSPITAL_BASED_OUTPATIENT_CLINIC_OR_DEPARTMENT_OTHER): Payer: PRIVATE HEALTH INSURANCE | Admitting: Lab

## 2011-05-24 ENCOUNTER — Encounter: Payer: Self-pay | Admitting: *Deleted

## 2011-05-24 ENCOUNTER — Other Ambulatory Visit: Payer: Self-pay | Admitting: Oncology

## 2011-05-24 ENCOUNTER — Telehealth: Payer: Self-pay | Admitting: Oncology

## 2011-05-24 ENCOUNTER — Encounter: Payer: Self-pay | Admitting: Oncology

## 2011-05-24 ENCOUNTER — Ambulatory Visit: Payer: PRIVATE HEALTH INSURANCE

## 2011-05-24 DIAGNOSIS — E876 Hypokalemia: Secondary | ICD-10-CM

## 2011-05-24 DIAGNOSIS — C189 Malignant neoplasm of colon, unspecified: Secondary | ICD-10-CM

## 2011-05-24 DIAGNOSIS — C50419 Malignant neoplasm of upper-outer quadrant of unspecified female breast: Secondary | ICD-10-CM

## 2011-05-24 DIAGNOSIS — C50919 Malignant neoplasm of unspecified site of unspecified female breast: Secondary | ICD-10-CM

## 2011-05-24 DIAGNOSIS — C50911 Malignant neoplasm of unspecified site of right female breast: Secondary | ICD-10-CM | POA: Insufficient documentation

## 2011-05-24 HISTORY — DX: Malignant neoplasm of colon, unspecified: C18.9

## 2011-05-24 LAB — CBC WITH DIFFERENTIAL/PLATELET
Eosinophils Absolute: 0.1 10*3/uL (ref 0.0–0.5)
HGB: 10.1 g/dL — ABNORMAL LOW (ref 11.6–15.9)
LYMPH%: 28.9 % (ref 14.0–49.7)
MONO#: 0.4 10*3/uL (ref 0.1–0.9)
NEUT#: 3.1 10*3/uL (ref 1.5–6.5)
Platelets: 287 10*3/uL (ref 145–400)
RBC: 4.08 10*6/uL (ref 3.70–5.45)
RDW: 24.1 % — ABNORMAL HIGH (ref 11.2–14.5)
WBC: 5.1 10*3/uL (ref 3.9–10.3)

## 2011-05-24 LAB — COMPREHENSIVE METABOLIC PANEL
Albumin: 3.5 g/dL (ref 3.5–5.2)
CO2: 33 mEq/L — ABNORMAL HIGH (ref 19–32)
Glucose, Bld: 79 mg/dL (ref 70–99)
Potassium: 3 mEq/L — ABNORMAL LOW (ref 3.5–5.3)
Sodium: 142 mEq/L (ref 135–145)
Total Protein: 6.7 g/dL (ref 6.0–8.3)

## 2011-05-24 MED ORDER — POTASSIUM CHLORIDE CRYS ER 20 MEQ PO TBCR: 20.0000 meq | EXTENDED_RELEASE_TABLET | Freq: Every day | ORAL | Status: DC

## 2011-05-24 NOTE — Telephone Encounter (Signed)
Gv pt appt for jan2013.  pt stated that she will call lisa to scheduled genetic appt. provided phone number

## 2011-05-25 ENCOUNTER — Encounter (INDEPENDENT_AMBULATORY_CARE_PROVIDER_SITE_OTHER): Payer: Self-pay

## 2011-05-29 NOTE — Progress Notes (Signed)
OFFICE PROGRESS NOTE  CC  Bean,Holly D, MD 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 At her final pathology revealed a low grade invasive ductal carcinoma with with 0 of 2 lymph nodes positive for metastatic disease. The tumor was ER positive PR positive HER-2/neu negative.  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 Post lumpectomy patient went on to receive radiation therapy from 12/28/2010 2 01/18/2011 to the right breast. She thereafter received single agent Arimidex 1 mg daily starting on 02/06/2011.  #2 05/01/2011 patient developed generalized weakness shortness of breath and syncopal episode. She had a CT of the head performed that was negative. She had CBC done with that showed a hemoglobin of 6.7. She received 3 units of packed red cells. She was severely iron deficient. FOB was positive. Patient had a GI consult performed during her hospitalization and on 1212 she underwent a colonoscopy that revealed a near obstructing colonic mass worrisome for a hepatic flexure tumor. She had biopsies performed that revealed an adenocarcinoma. She went on to have a right segmental resection performed on 05/05/2011. The final pathology revealed a 5.0 cm poorly differentiated high grade invasive adenocarcinoma with patchy  mucinous features invading through the muscularis propria into pericolonic fat he tissue 34 lymph nodes were negative for metastatic disease. (T3 N0)  CURRENT THERAPY:patient is now undergoing evaluation for possible treatment for her stage II colon cancer.  INTERVAL HISTORY: [name] 66 y.o. female returns for followup visit after discharge after having her colon surgery performed. Clinically she seems to be doing well. Overall she has tolerated her surgery quite well without any complaints. Today for the most part she feels well she denies any fevers chills night sweats headaches shortness of breath chest pains palpitations she has no nausea or vomiting no fevers chills or night sweats. She is denying any abdominal pain any gas any diarrhea or constipation. Remainder of the 10 point review of systems is negative. Patient however is quite anxious about 2 diagnoses of cancer including breast as well as now colon cancer.  MEDICAL HISTORY: Past Medical History  Diagnosis Date  . Breast cancer   . Hypertension   . Depression   . Back pain, chronic   . Cancer of colon 05/24/2011  . Breast cancer 05/24/2011    ALLERGIES:   has no known allergies.  MEDICATIONS:  Current Outpatient Prescriptions  Medication Sig Dispense Refill  . ALPRAZolam (XANAX) 0.5 MG tablet Take 1 tablet (0.5 mg total) by mouth 2 (two) times daily as needed for anxiety. For anxiety  1 tablet  0  . anastrozole (ARIMIDEX) 1 MG tablet Take 1 mg by mouth at bedtime.        Marland Kitchen buPROPion (WELLBUTRIN XL) 300 MG 24 hr tablet Take 300 mg by mouth daily.        . fluvoxaMINE (LUVOX) 50 MG tablet Take 50-100 mg  by mouth at bedtime. 1 tab in the am, 2 tabs in the pm       . furosemide (LASIX) 20 MG tablet Take 2 tablets (40 mg total) by mouth 2 (two) times daily. Take two tablets once daily if needed for leg swelling.  1 tablet  0  . nebivolol (BYSTOLIC) 10 MG tablet Take 10 mg by mouth daily.        . Oxycodone HCl (OXYCONTIN) 60  MG TB12 Take 1 tablet by mouth 3 (three) times daily as needed. For pain       . potassium chloride SA (K-DUR,KLOR-CON) 20 MEQ tablet Take 1 tablet (20 mEq total) by mouth daily.  5 tablet  0    SURGICAL HISTORY:  Past Surgical History  Procedure Date  . Back surgery   . Shoulder surgery   . Tumor removal Tumor removed R breast  . Lumbar disc surgery   . Partial hysterectomy   . Colonoscopy 05/03/2011    Procedure: COLONOSCOPY;  Surgeon: Jeryl Columbia, MD;  Location: [facility] ENDOSCOPY;  Service: Endoscopy;  Laterality: N/A;    REVIEW OF SYSTEMS:  A comprehensive review of systems was negative.   PHYSICAL EXAMINATION: General appearance: alert, cooperative and appears stated age Head: Normocephalic, without obvious abnormality, atraumatic Neck: no adenopathy, no carotid bruit, no JVD, supple, symmetrical, trachea midline and thyroid not enlarged, symmetric, no tenderness/mass/nodules Lymph nodes: Cervical, supraclavicular, and axillary nodes normal. Resp: clear to auscultation bilaterally and normal percussion bilaterally Back: symmetric, no curvature. ROM normal. No CVA tenderness. Cardio: regular rate and rhythm, S1, S2 normal, no murmur, click, rub or gallop GI: soft, non-tender; bowel sounds normal; no masses,  no organomegaly Extremities: extremities normal, atraumatic, no cyanosis or edema Neurologic: Alert and oriented X 3, normal strength and tone. Normal symmetric reflexes. Normal coordination and gait  ECOG PERFORMANCE STATUS: 1 - Symptomatic but completely ambulatory  Blood pressure 100/61, pulse 52, temperature 98.2 F (36.8 C), temperature source Oral, height 5' 2.5" (1.588 m), weight 135 lb 3.2 oz (61.326 kg).  LABORATORY DATA: Lab Results  Component Value Date   WBC 5.1 05/24/2011   HGB 10.1* 05/24/2011   HCT 30.8* 05/24/2011   MCV 75.5* 05/24/2011   PLT 287 05/24/2011      Chemistry      Component Value Date/Time   NA 142 05/24/2011 1451   K 3.0* 05/24/2011 1451   CL 102  05/24/2011 1451   CO2 33* 05/24/2011 1451   BUN 33* 05/24/2011 1451   CREATININE 1.33* 05/24/2011 1451      Component Value Date/Time   CALCIUM 9.6 05/24/2011 1451   ALKPHOS 75 05/24/2011 1451   AST 19 05/24/2011 1451   ALT 13 05/24/2011 1451   BILITOT 0.2* 05/24/2011 1451       RADIOGRAPHIC STUDIES:  Dg Chest 2 View  04/30/2011  *RADIOLOGY REPORT*  Clinical Data: Altered mental status.  Right-sided chest pain. History of breast cancer and radiation.  CHEST - 2 VIEW  Comparison: 03/20/2011  Findings: Normal heart size and pulmonary vascularity.  Pulmonary hyperinflation consistent with emphysema.  Scattered fibrosis in the lungs.  Old right rib fractures.  Surgical clips in the right chest.  Mild blunting of right costophrenic angle may suggest fluid or thickened pleura.  Degenerative changes in the thoracic spine. No focal airspace consolidation in the lungs.  No pneumothorax. Postoperative changes in both shoulders.  Postoperative changes in the lumbar spine.  IMPRESSION: Overall stable appearance of the chest since previous  study. Emphysematous changes and scattered fibrosis.  Blunting of the right costophrenic angle suggesting fluid or thickened pleura.  Original Report Authenticated By: Neale Burly, M.D.   Ct Head Wo Contrast  04/30/2011  *RADIOLOGY REPORT*  Clinical Data: Altered mental status.  Syncope.  Abrasion to side the right eye, possibly from fall.  CT HEAD WITHOUT CONTRAST  Technique:  Contiguous axial images were obtained from the base of the skull through the vertex without contrast.  Comparison: None.  Findings: Diffuse cerebral atrophy.  Ventricular dilatation consistent with central atrophy.  Low attenuation changes in the deep white matter suggesting small vessel ischemia.  Focal low attenuation changes in the basal ganglia bilaterally consistent with old lacunar infarcts.  No mass effect or midline shift.  No abnormal extra-axial fluid collections.  The gray-white matter junctions are  distinct.  The basal cisterns are not effaced.  No evidence of acute intracranial hemorrhage.  No depressed skull fractures.  Visualized paranasal sinuses and mastoid air cells are not opacified.  Calcification of the intracranial arteries.  IMPRESSION: Chronic atrophy and small vessel ischemic changes.  No evidence of acute intracranial hemorrhage, mass lesion, or acute infarct.  Original Report Authenticated By: Neale Burly, M.D.   Ct Chest W Contrast  05/03/2011  *RADIOLOGY REPORT*  Clinical Data:  Anemia, weight loss, abdominal pain, colon cancer, evaluate for metastases.  History of breast cancer.  CT CHEST, ABDOMEN AND PELVIS WITH CONTRAST  Technique:  Multidetector CT imaging of the chest, abdomen and pelvis was performed following the standard protocol during bolus administration of intravenous contrast.  Contrast: 197mL OMNIPAQUE IOHEXOL 300 MG/ML IV SOLN  Comparison:  None  CT CHEST  Findings:  Mild centrilobular emphysematous changes.  Mild subpleural reticulation in the anterior right upper lobe, likely reflecting radiation changes.  Small right pleural effusion with associated lower lobe atelectasis.  The heart is normal in size.  No pericardial effusion.  Coronary atherosclerosis.  Atherosclerotic calcifications of the aortic arch.  No suspicious mediastinal, hilar, or axillary lymphadenopathy.  Postsurgical changes in the lateral right breast.  Overlying skin thickening, likely reflecting prior radiation.  Moderate degenerative changes of the thoracic spine.  Old right posterolateral rib deformities.  Bilateral shoulder arthroplasties. No focal osseous lesions.  IMPRESSION: Postsurgical changes in the lateral right breast.  Postradiation changes in the right breast and anterior right upper lobe.  No evidence of recurrent or metastatic disease in the chest.  Mild centrilobular emphysematous changes.  CT ABDOMEN AND PELVIS  Findings:  Tiny hypoenhancing lesion in the right hepatic lobe  (series 2/image 51), too small to characterize but measuring simple fluid density and likely benign.  Spleen, pancreas, and adrenal glands are within normal limits.  Gallbladder is unremarkable.  No intrahepatic or extrahepatic ductal dilatation.  Kidneys are unremarkable.  No hydronephrosis.  No evidence of bowel obstruction.  Normal appendix.  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  Mild wall thickening involving a loop of sigmoid colon (series 2/image 101), possibly reflecting underdistension versus muscular hypertrophy secondary to diverticulosis.  No suspicious abdominopelvic lymphadenopathy.  Atherosclerotic calcifications of the abdominal aorta and branch vessels.  Small volume pelvic ascites.  Status post hysterectomy.  No adnexal masses.  Moderate degenerative changes of the lumbar spine.  Status post PLIF at L2-3 and L4-5.  No focal osseous lesions.  IMPRESSION: Concentric wall thickening involving a 3.4 cm segment of the ascending colon, likely  reflecting colonic adenocarcinoma.  No evidence of metastatic disease in the abdomen/pelvis.  Additional ancillary findings as above.  Original Report Authenticated By: Julian Hy, M.D.   Ct Abdomen Pelvis W Contrast  05/03/2011  *RADIOLOGY REPORT*  Clinical Data:  Anemia, weight loss, abdominal pain, colon cancer, evaluate for metastases.  History of breast cancer.  CT CHEST, ABDOMEN AND PELVIS WITH CONTRAST  Technique:  Multidetector CT imaging of the chest, abdomen and pelvis was performed following the standard protocol during bolus administration of intravenous contrast.  Contrast: 145mL OMNIPAQUE IOHEXOL 300 MG/ML IV SOLN  Comparison:  None  CT CHEST  Findings:  Mild centrilobular emphysematous changes.  Mild subpleural reticulation in the anterior right upper lobe, likely reflecting radiation changes.  Small right pleural effusion  with associated lower lobe atelectasis.  The heart is normal in size.  No pericardial effusion.  Coronary atherosclerosis.  Atherosclerotic calcifications of the aortic arch.  No suspicious mediastinal, hilar, or axillary lymphadenopathy.  Postsurgical changes in the lateral right breast.  Overlying skin thickening, likely reflecting prior radiation.  Moderate degenerative changes of the thoracic spine.  Old right posterolateral rib deformities.  Bilateral shoulder arthroplasties. No focal osseous lesions.  IMPRESSION: Postsurgical changes in the lateral right breast.  Postradiation changes in the right breast and anterior right upper lobe.  No evidence of recurrent or metastatic disease in the chest.  Mild centrilobular emphysematous changes.  CT ABDOMEN AND PELVIS  Findings:  Tiny hypoenhancing lesion in the right hepatic lobe (series 2/image 51), too small to characterize but measuring simple fluid density and likely benign.  Spleen, pancreas, and adrenal glands are within normal limits.  Gallbladder is unremarkable.  No intrahepatic or extrahepatic ductal dilatation.  Kidneys are unremarkable.  No hydronephrosis.  No evidence of bowel obstruction.  Normal appendix.  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  Mild wall thickening involving a loop of sigmoid colon (series 2/image 101), possibly reflecting underdistension versus muscular hypertrophy secondary to diverticulosis.  No suspicious abdominopelvic lymphadenopathy.  Atherosclerotic calcifications of the abdominal aorta and branch vessels.  Small volume pelvic ascites.  Status post hysterectomy.  No adnexal masses.  Moderate degenerative changes of the lumbar spine.  Status post PLIF at L2-3 and L4-5.  No focal osseous lesions.  IMPRESSION: Concentric wall thickening involving a 3.4 cm segment of the ascending colon, likely reflecting colonic  adenocarcinoma.  No evidence of metastatic disease in the abdomen/pelvis.  Additional ancillary findings as above.  Original Report Authenticated By: Julian Hy, M.D.   Dg Chest Port 1 View  05/02/2011  *RADIOLOGY REPORT*  Clinical Data: Shortness of breath.  Right-sided chest pain and upper back pain.  Cough.  Hypertension and breast cancer.  Ex- smoker.  PORTABLE CHEST - 1 VIEW  Comparison: 04/30/2011  Findings: Remote right rib trauma.  Bilateral shoulder arthroplasties.  Surgical clips in the right breast or low right axilla.  Normal heart size.  The previously described right costophrenic angle blunting is no longer identified. No pneumothorax.  The pulmonary interstitium is increasingly prominent. No lobar consolidation.  IMPRESSION: Increased pulmonary interstitial prominence.  This could be partially due to AP portable technique and low lung volumes. Developing mild pulmonary venous congestion cannot be excluded.  Likely resolved trace right pleural fluid.  Original Report Authenticated By: Areta Haber, M.D.    ASSESSMENT: 66 year old female with 2 primary malignancies including his stage I invasive ductal carcinoma of  the right breast for which she has had a lumpectomy followed by radiation therapy and is on Arimidex 1 mg daily and she is tolerating it well. Patient now is also diagnosed with stage II adenocarcinoma of the colon status post segmental resection. Patient is seen in medical oncology for consideration of treatment options. Of note her tumor was sent for MSI analysis which is pending. Clinically she seems to be doing well her   PLAN: at this time my plan is to followup on the results of the MSI testing. We will continue the Arimidex 1 mg daily. I will also set her up for a PET scan as a baseline for her. At this time I do not foresee her needing any further adjuvant treatment for her colon cancer however I will discuss the case with my colleagues to see what their  recommendation is. I am also concerned and this 67 year old with 2 primary malignancies a whether there may be a genetic predisposition. I have recommended the patient be seen by genetic counselor and possible consideration of genetic testing. I discussed patient's pathology in great detail and rationale for treatment possibly. She will be seen back in a few weeks time for followup here   All questions were answered. The patient knows to call the clinic with any problems, questions or concerns. We can certainly see the patient much sooner if necessary.  I spent 25 minutes counseling the patient face to face. The total time spent in the appointment was 30 minutes.    [name], MD Medical/Oncology [facility] [phone] (beeper) [phone] (Office)  05/29/2011, 5:58 PM

## 2011-05-30 ENCOUNTER — Encounter (INDEPENDENT_AMBULATORY_CARE_PROVIDER_SITE_OTHER): Payer: Self-pay | Admitting: Surgery

## 2011-06-01 ENCOUNTER — Ambulatory Visit (INDEPENDENT_AMBULATORY_CARE_PROVIDER_SITE_OTHER): Payer: PRIVATE HEALTH INSURANCE | Admitting: Surgery

## 2011-06-01 ENCOUNTER — Encounter: Payer: Self-pay | Admitting: Radiation Oncology

## 2011-06-01 ENCOUNTER — Encounter (INDEPENDENT_AMBULATORY_CARE_PROVIDER_SITE_OTHER): Payer: Self-pay | Admitting: Surgery

## 2011-06-01 ENCOUNTER — Ambulatory Visit
Admission: RE | Admit: 2011-06-01 | Discharge: 2011-06-01 | Disposition: A | Discharge status: Home / Self Care | Payer: PRIVATE HEALTH INSURANCE | Source: Ambulatory Visit | Attending: Radiation Oncology | Admitting: Radiation Oncology

## 2011-06-01 ENCOUNTER — Encounter (INDEPENDENT_AMBULATORY_CARE_PROVIDER_SITE_OTHER): Payer: Self-pay

## 2011-06-01 VITALS — BP 128/76 | HR 72 | Temp 98.0°F | Resp 16 | Ht 63.0 in | Wt 141.8 lb

## 2011-06-01 DIAGNOSIS — C189 Malignant neoplasm of colon, unspecified: Secondary | ICD-10-CM

## 2011-06-01 DIAGNOSIS — C50919 Malignant neoplasm of unspecified site of unspecified female breast: Secondary | ICD-10-CM

## 2011-06-01 NOTE — Patient Instructions (Signed)
You may resume full activity in 2 weeks.  Regular diet.

## 2011-06-01 NOTE — Progress Notes (Signed)
Status post laparoscopic right hemicolectomy on 05/05/11 for right colon adenocarcinoma. Her lymph nodes were all negative. The patient was discharged on 1218. She is doing much better. Appetite and bowel movements are returning to normal. Her energy is better. She has seen Dr. Humphrey Rolls who has determined that she needs no further adjuvant treatment for her colon cancer. She is continuing her breast cancer treatment. She has minimal soreness around her incision.  On examination her incisions are all well healed with no sign of infection and no sign of hernia. Minimal abdominal tenderness. Her abdomen is soft and flat.  Impression: Right colon cancer status post right hemicolectomy with negative lymph nodes. Doing quite well.  Plan: The patient may resume full activity over the next 2 weeks. She may resume regular diet. No need for further surgical followup for colon cancer. She may continue to follow up with Dr. Brantley Stage as scheduled for her breast cancer. She will continue her management by Dr. Humphrey Rolls.  [name]. [name], MD, [facility]  06/01/2011 10:36 AM

## 2011-06-01 NOTE — Progress Notes (Signed)
Has had colon cancer since you last, did colectomy.  Still has soreness on side of affected breast

## 2011-06-06 NOTE — Progress Notes (Signed)
CC:   [name] Stage, M.D. [name], M.D. [name], M.D. Holly Chapel, FNP  DIAGNOSIS:  T1c N0 invasive ductal carcinoma of the right breast.  PREVIOUS RADIATION:  42.72 Gy completed 01/18/2011.  INTERVAL SINCE TREATMENT:  4-1/2 months.  INTERVAL HISTORY:  [name] reports for followup today.  She had a colonoscopy due to anemia and was found to have a colon cancer.  She had a colectomy done and no adjuvant treatment.  She is recovering from that.  She continues to struggle with right breast pain.  This is particularly bothersome to her when she sleeps or when she takes her bra off and has no support.  She describes it as starting in her axilla and radiating down her arm and down into her breast.  She states it is worse when she stretches her arm up, for example to do her hair.  She has had shoulder surgery on that side.  She has not seen physical therapy.  She takes an occasional pain medication for this.  PHYSICAL EXAMINATION:  She is a pleasant female in no distress sitting comfortably on the exam room table.  She actually appears more energetic than I have seen her in the past.  In her right breast and right axilla she has ptotic breasts bilaterally.  There is really nothing palpable of concern in the area of pain.  I can elicit pain with pressure on her rib cage pain and in her right axilla.  Nothing palpable in her left breast.  IMPRESSION:  T1c right breast cancer status post hypofractionated radiation with right breast pain.  RECOMMENDATIONS:  I have referred [name] to our physical therapy specialist for consideration of stretching of that right arm.  I wonder if it is a complication from her sentinel lymph node biopsy or from some muscle strain from radiation.  Either way, I think she would benefit from physical therapy evaluation.  I will plan on seeing her back in 6 months.  She also has regularly scheduled follow up with Dr.  Humphrey Rolls.    ______________________________ Thea Silversmith, M.D. SW/MEDQ  D:  06/05/2011  T:  06/05/2011  Job:  [id number]

## 2011-06-08 ENCOUNTER — Telehealth: Payer: Self-pay

## 2011-06-08 ENCOUNTER — Telehealth: Payer: Self-pay | Admitting: *Deleted

## 2011-06-08 NOTE — Telephone Encounter (Signed)
Called pt to inform her per Dr. Humphrey Rolls, pt cannot be seen in office today, needs to keep appt tomorrow for evaluation, but should report to ED if she cannot wait until appt.  Encouraged pt to elevate legs.  Pt verbalizes understanding.

## 2011-06-08 NOTE — Telephone Encounter (Signed)
LEGS ARE RED IN COLOR. ONE AREA HAS PITTING EDEMA. PT. HAS AN APPOINTMENT WITH DR.KHAN TOMORROW BUT DOES SHE NEED TO BE SEEN TODAY? A NOTE GIVEN TO DR.KHAN'S NURSE, TIFFANY BRUCE,RN.

## 2011-06-09 ENCOUNTER — Other Ambulatory Visit (HOSPITAL_BASED_OUTPATIENT_CLINIC_OR_DEPARTMENT_OTHER): Payer: PRIVATE HEALTH INSURANCE | Admitting: Lab

## 2011-06-09 ENCOUNTER — Ambulatory Visit: Payer: PRIVATE HEALTH INSURANCE | Admitting: Oncology

## 2011-06-09 ENCOUNTER — Telehealth: Payer: Self-pay | Admitting: *Deleted

## 2011-06-09 VITALS — BP 121/71 | HR 60 | Temp 97.9°F | Ht 63.0 in | Wt 145.9 lb

## 2011-06-09 DIAGNOSIS — C50919 Malignant neoplasm of unspecified site of unspecified female breast: Secondary | ICD-10-CM

## 2011-06-09 DIAGNOSIS — R2240 Localized swelling, mass and lump, unspecified lower limb: Secondary | ICD-10-CM

## 2011-06-09 DIAGNOSIS — R229 Localized swelling, mass and lump, unspecified: Secondary | ICD-10-CM

## 2011-06-09 DIAGNOSIS — C189 Malignant neoplasm of colon, unspecified: Secondary | ICD-10-CM

## 2011-06-09 DIAGNOSIS — E876 Hypokalemia: Secondary | ICD-10-CM

## 2011-06-09 LAB — CBC WITH DIFFERENTIAL/PLATELET
Eosinophils Absolute: 0.2 10*3/uL (ref 0.0–0.5)
MONO#: 0.6 10*3/uL (ref 0.1–0.9)
NEUT#: 3 10*3/uL (ref 1.5–6.5)
RBC: 4.09 10*6/uL (ref 3.70–5.45)
RDW: 26.3 % — ABNORMAL HIGH (ref 11.2–14.5)
WBC: 5.3 10*3/uL (ref 3.9–10.3)
lymph#: 1.5 10*3/uL (ref 0.9–3.3)

## 2011-06-09 LAB — BASIC METABOLIC PANEL
CO2: 33 mEq/L — ABNORMAL HIGH (ref 19–32)
Glucose, Bld: 93 mg/dL (ref 70–99)
Potassium: 3.8 mEq/L (ref 3.5–5.3)
Sodium: 142 mEq/L (ref 135–145)

## 2011-06-09 NOTE — Telephone Encounter (Signed)
patient will call me when she wants to me to schedule the doppler study

## 2011-06-12 ENCOUNTER — Ambulatory Visit: Payer: PRIVATE HEALTH INSURANCE | Admitting: Physical Therapy

## 2011-06-12 NOTE — Progress Notes (Signed)
OFFICE PROGRESS NOTE  CC  Kristine Garbe, MD, MD 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 At her final pathology revealed a low grade invasive ductal carcinoma with with 0 of 2 lymph nodes positive for metastatic disease. The tumor was ER positive PR positive HER-2/neu negative.  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 Post lumpectomy patient went on to receive radiation therapy from 12/28/2010 2 01/18/2011 to the right breast. She thereafter received single agent Arimidex 1 mg daily starting on 02/06/2011.  #2 05/01/2011 patient developed generalized weakness shortness of breath and syncopal episode. She had a CT of the head performed that was negative. She had CBC done with that showed a hemoglobin of 6.7. She received 3 units of packed red cells. She was severely iron deficient. FOB was positive. Patient had a GI consult performed during her hospitalization and on 1212 she underwent a colonoscopy that revealed a near obstructing colonic mass worrisome for a hepatic flexure tumor. She had biopsies performed that revealed an adenocarcinoma. She went on to have a right segmental resection performed on 05/05/2011. The final pathology revealed a 5.0 cm poorly differentiated high grade invasive adenocarcinoma with patchy  mucinous features invading through the muscularis propria into pericolonic fat he tissue 34 lymph nodes were negative for metastatic disease. (T3 N0)  CURRENT THERAPY:  1. Breast cancer: arimidex 2. Colon Cancer: observation  INTERVAL HISTORY: [name] 66 y.o. female returns for followup visit today. She is accompanied by her niece. Clinically patient seems to be doing well. She had called [name] yesterday complaining of bilateral lower extremity swelling. Apparently she has had this issue ongoing even prior to the diagnosis of her breast cancer and now recently colon cancer. Today she does note increasing bilateral lower extremity edema. She has been taking Lasix for this off-and-on. Her primary care physician in fact right prescriptions for her for the Lasix. She has been taking the Lasix but in spite of that she has had some lower extremity edema. She otherwise denies any pain tenderness in her legs. She denies any shortness of breath chest pains palpitations she is weak and tired and fatigued. She does not have any hematuria hematochezia melena hemoptysis or hematemesis. Remainder of the 10 point review of systems is negative. MEDICAL HISTORY: Past Medical History  Diagnosis Date  . Breast cancer   . Hypertension   . Depression   . Back pain, chronic   . Cancer of colon 05/24/2011  . Breast cancer 05/24/2011    ALLERGIES:   has no known allergies.  MEDICATIONS:  Current Outpatient Prescriptions  Medication Sig Dispense Refill  . ALPRAZolam (XANAX) 0.5 MG tablet Take 1 tablet (0.5 mg total) by mouth 2 (two) times daily as needed for anxiety. For anxiety  1 tablet  0  . anastrozole (ARIMIDEX) 1 MG tablet Take 1 mg by mouth at bedtime.        Marland Kitchen  buPROPion (WELLBUTRIN XL) 300 MG 24 hr tablet Take 300 mg by mouth daily.        . fluvoxaMINE (LUVOX) 50 MG tablet Take 50-100 mg by mouth at bedtime. 1 tab in the am, 2 tabs in the pm       . furosemide (LASIX) 20 MG tablet Take 2 tablets (40  mg total) by mouth 2 (two) times daily. Take two tablets once daily if needed for leg swelling.  1 tablet  0  . nebivolol (BYSTOLIC) 10 MG tablet Take 10 mg by mouth daily.        . Oxycodone HCl (OXYCONTIN) 60 MG TB12 Take 1 tablet by mouth 3 (three) times daily as needed. For pain       . potassium chloride SA (K-DUR,KLOR-CON) 20 MEQ tablet Take 1 tablet (20 mEq total) by mouth daily.  5 tablet  0    SURGICAL HISTORY:  Past Surgical History  Procedure Date  . Back surgery   . Shoulder surgery   . Tumor removal Tumor removed R breast  . Lumbar disc surgery   . Partial hysterectomy   . Colonoscopy 05/03/2011    Procedure: COLONOSCOPY;  Surgeon: Jeryl Columbia, MD;  Location: [facility] ENDOSCOPY;  Service: Endoscopy;  Laterality: N/A;  . Colon surgery 04/2011  . Breast lumpectomy 10/10/2010    Rt Breast    REVIEW OF SYSTEMS:  As in the interim history   PHYSICAL EXAMINATION: Patient is awake alert in no acute distress.   HEENT exam: EOMI PERRLA sclerae anicteric no conjunctival pallor oral mucosa is moist.  Neck: Supple no palpable cervical supraclavicular or axillary adenopathy.  Lungs are clear bilaterally to auscultation and percussion cardiovascular  Cardiovascular: Regular rate rhythm no murmurs gallops or rubs.  Abdomen is soft nontender nondistended bowel sounds are present well-healed surgical scar. There is no hepatosplenomegaly.  Extremities +2 edema there is noted to be some tenderness bilaterally in the calf.  Neuro patient's alert oriented x3 otherwise nonfocal. Patient does has considerable amount of anxiety   ECOG PERFORMANCE STATUS: 1 - Symptomatic but completely ambulatory  Blood pressure 121/71, pulse 60, temperature 97.9 F (36.6 C), height 5\' 3"  (1.6 m), weight 145 lb 14.4 oz (66.18 kg).  LABORATORY DATA: Lab Results  Component Value Date   WBC 5.3 06/09/2011   HGB 10.3* 06/09/2011   HCT 31.3* 06/09/2011   MCV 76.5* 06/09/2011   PLT 220 06/09/2011       Chemistry      Component Value Date/Time   NA 142 06/09/2011 1106   K 3.8 06/09/2011 1106   CL 101 06/09/2011 1106   CO2 33* 06/09/2011 1106   BUN 47* 06/09/2011 1106   CREATININE 1.19* 06/09/2011 1106      Component Value Date/Time   CALCIUM 9.8 06/09/2011 1106   ALKPHOS 75 05/24/2011 1451   AST 19 05/24/2011 1451   ALT 13 05/24/2011 1451   BILITOT 0.2* 05/24/2011 1451       RADIOGRAPHIC STUDIES:  Dg Chest 2 View  04/30/2011  *RADIOLOGY REPORT*  Clinical Data: Altered mental status.  Right-sided chest pain. History of breast cancer and radiation.  CHEST - 2 VIEW  Comparison: 03/20/2011  Findings: Normal heart size and pulmonary vascularity.  Pulmonary hyperinflation consistent with emphysema.  Scattered fibrosis in the lungs.  Old right rib fractures.  Surgical clips in the right chest.  Mild blunting of right costophrenic angle may suggest fluid or thickened pleura.  Degenerative changes in the thoracic spine.  No focal airspace consolidation in the lungs.  No pneumothorax. Postoperative changes in both shoulders.  Postoperative changes in the lumbar spine.  IMPRESSION: Overall stable appearance of the chest since previous study. Emphysematous changes and scattered fibrosis.  Blunting of the right costophrenic angle suggesting fluid or thickened pleura.  Original Report Authenticated By: Neale Burly, M.D.   Ct Head Wo Contrast  04/30/2011  *RADIOLOGY REPORT*  Clinical Data: Altered mental status.  Syncope.  Abrasion to side the right eye, possibly from fall.  CT HEAD WITHOUT CONTRAST  Technique:  Contiguous axial images were obtained from the base of the skull through the vertex without contrast.  Comparison: None.  Findings: Diffuse cerebral atrophy.  Ventricular dilatation consistent with central atrophy.  Low attenuation changes in the deep white matter suggesting small vessel ischemia.  Focal low attenuation changes in the basal ganglia bilaterally consistent with old lacunar infarcts.  No  mass effect or midline shift.  No abnormal extra-axial fluid collections.  The gray-white matter junctions are distinct.  The basal cisterns are not effaced.  No evidence of acute intracranial hemorrhage.  No depressed skull fractures.  Visualized paranasal sinuses and mastoid air cells are not opacified.  Calcification of the intracranial arteries.  IMPRESSION: Chronic atrophy and small vessel ischemic changes.  No evidence of acute intracranial hemorrhage, mass lesion, or acute infarct.  Original Report Authenticated By: Neale Burly, M.D.   Ct Chest W Contrast  05/03/2011  *RADIOLOGY REPORT*  Clinical Data:  Anemia, weight loss, abdominal pain, colon cancer, evaluate for metastases.  History of breast cancer.  CT CHEST, ABDOMEN AND PELVIS WITH CONTRAST  Technique:  Multidetector CT imaging of the chest, abdomen and pelvis was performed following the standard protocol during bolus administration of intravenous contrast.  Contrast: 158mL OMNIPAQUE IOHEXOL 300 MG/ML IV SOLN  Comparison:  None  CT CHEST  Findings:  Mild centrilobular emphysematous changes.  Mild subpleural reticulation in the anterior right upper lobe, likely reflecting radiation changes.  Small right pleural effusion with associated lower lobe atelectasis.  The heart is normal in size.  No pericardial effusion.  Coronary atherosclerosis.  Atherosclerotic calcifications of the aortic arch.  No suspicious mediastinal, hilar, or axillary lymphadenopathy.  Postsurgical changes in the lateral right breast.  Overlying skin thickening, likely reflecting prior radiation.  Moderate degenerative changes of the thoracic spine.  Old right posterolateral rib deformities.  Bilateral shoulder arthroplasties. No focal osseous lesions.  IMPRESSION: Postsurgical changes in the lateral right breast.  Postradiation changes in the right breast and anterior right upper lobe.  No evidence of recurrent or metastatic disease in the chest.  Mild centrilobular  emphysematous changes.  CT ABDOMEN AND PELVIS  Findings:  Tiny hypoenhancing lesion in the right hepatic lobe (series 2/image 51), too small to characterize but measuring simple fluid density and likely benign.  Spleen, pancreas, and adrenal glands are within normal limits.  Gallbladder is unremarkable.  No intrahepatic or extrahepatic ductal dilatation.  Kidneys are unremarkable.  No hydronephrosis.  No evidence of bowel obstruction.  Normal appendix.  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  Mild wall thickening involving a loop of sigmoid colon (series 2/image 101), possibly reflecting underdistension versus muscular hypertrophy secondary to diverticulosis.  No suspicious abdominopelvic lymphadenopathy.  Atherosclerotic calcifications of the abdominal aorta and branch vessels.  Small volume pelvic ascites.  Status post hysterectomy.  No adnexal masses.  Moderate degenerative changes  of the lumbar spine.  Status post PLIF at L2-3 and L4-5.  No focal osseous lesions.  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  No evidence of metastatic disease in the abdomen/pelvis.  Additional ancillary findings as above.  Original Report Authenticated By: Julian Hy, M.D.   Ct Abdomen Pelvis W Contrast  05/03/2011  *RADIOLOGY REPORT*  Clinical Data:  Anemia, weight loss, abdominal pain, colon cancer, evaluate for metastases.  History of breast cancer.  CT CHEST, ABDOMEN AND PELVIS WITH CONTRAST  Technique:  Multidetector CT imaging of the chest, abdomen and pelvis was performed following the standard protocol during bolus administration of intravenous contrast.  Contrast: 16mL OMNIPAQUE IOHEXOL 300 MG/ML IV SOLN  Comparison:  None  CT CHEST  Findings:  Mild centrilobular emphysematous changes.  Mild subpleural  reticulation in the anterior right upper lobe, likely reflecting radiation changes.  Small right pleural effusion with associated lower lobe atelectasis.  The heart is normal in size.  No pericardial effusion.  Coronary atherosclerosis.  Atherosclerotic calcifications of the aortic arch.  No suspicious mediastinal, hilar, or axillary lymphadenopathy.  Postsurgical changes in the lateral right breast.  Overlying skin thickening, likely reflecting prior radiation.  Moderate degenerative changes of the thoracic spine.  Old right posterolateral rib deformities.  Bilateral shoulder arthroplasties. No focal osseous lesions.  IMPRESSION: Postsurgical changes in the lateral right breast.  Postradiation changes in the right breast and anterior right upper lobe.  No evidence of recurrent or metastatic disease in the chest.  Mild centrilobular emphysematous changes.  CT ABDOMEN AND PELVIS  Findings:  Tiny hypoenhancing lesion in the right hepatic lobe (series 2/image 51), too small to characterize but measuring simple fluid density and likely benign.  Spleen, pancreas, and adrenal glands are within normal limits.  Gallbladder is unremarkable.  No intrahepatic or extrahepatic ductal dilatation.  Kidneys are unremarkable.  No hydronephrosis.  No evidence of bowel obstruction.  Normal appendix.  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  Mild wall thickening involving a loop of sigmoid colon (series 2/image 101), possibly reflecting underdistension versus muscular hypertrophy secondary to diverticulosis.  No suspicious abdominopelvic lymphadenopathy.  Atherosclerotic calcifications of the abdominal aorta and branch vessels.  Small volume pelvic ascites.  Status post hysterectomy.  No adnexal masses.  Moderate degenerative changes of the lumbar spine.  Status post PLIF at L2-3 and L4-5.  No focal osseous lesions.   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  No evidence of metastatic disease in the abdomen/pelvis.  Additional ancillary findings as above.  Original Report Authenticated By: Julian Hy, M.D.   Dg Chest Port 1 View  05/02/2011  *RADIOLOGY REPORT*  Clinical Data: Shortness of breath.  Right-sided chest pain and upper back pain.  Cough.  Hypertension and breast cancer.  Ex- smoker.  PORTABLE CHEST - 1 VIEW  Comparison: 04/30/2011  Findings: Remote right rib trauma.  Bilateral shoulder arthroplasties.  Surgical clips in the right breast or low right axilla.  Normal heart size.  The previously described right costophrenic angle blunting is no longer identified. No pneumothorax.  The pulmonary interstitium is increasingly prominent. No lobar consolidation.  IMPRESSION: Increased pulmonary interstitial prominence.  This could be partially due to AP portable technique and low lung volumes. Developing mild pulmonary venous congestion cannot be excluded.  Likely  resolved trace right pleural fluid.  Original Report Authenticated By: Areta Haber, M.D.    ASSESSMENT: 66 year old female with 2 primary malignancies: 1.  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 2.   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 Patient is seen in medical oncology for consideration of treatment options.  3. Bilateral lower extremity edema  PLAN:  1. breast cancer: Patient will continue Arimidex 1 mg daily for a duration of 5 years.  #2 stage II adenocarcinoma of the colon: At this time no recommendations are made for adjuvant chemotherapy. Patient has a relatively good prognosis. The risks of treatments would far outweigh the benefits of adjuvant chemotherapy. All of this is  discussed with the patient and her niece and they do understand.  #3 bilateral lower extremity edema: I would like to rule out a DVT possibly and therefore I will go ahead and order bilateral ultrasounds Doppler ultrasound studies of the legs.  #4 patient will be seen back in 3-6 months time for followup.  All questions were answered. The patient knows to call the clinic with any problems, questions or concerns. We can certainly see the patient much sooner if necessary.  I spent 25 minutes counseling the patient face to face. The total time spent in the appointment was 30 minutes.    [name], MD Medical/Oncology [facility] [phone] (beeper) [phone] (Office)

## 2011-06-23 ENCOUNTER — Telehealth: Payer: Self-pay | Admitting: *Deleted

## 2011-06-23 NOTE — Telephone Encounter (Signed)
left message to inform the patient of the new date and time on 09-01-2011 starting at 11:00am

## 2011-07-27 ENCOUNTER — Telehealth: Payer: Self-pay | Admitting: Oncology

## 2011-07-27 NOTE — Telephone Encounter (Signed)
Pt called stating that she never been contacted with her appt according to the notes the pt was called and given an appt and requested to know if she can be seen sooner with dr Humphrey Rolls and on a Monday.

## 2011-09-01 ENCOUNTER — Other Ambulatory Visit: Payer: PRIVATE HEALTH INSURANCE | Admitting: Lab

## 2011-09-01 ENCOUNTER — Ambulatory Visit: Payer: PRIVATE HEALTH INSURANCE | Admitting: Family

## 2011-09-08 ENCOUNTER — Ambulatory Visit: Payer: PRIVATE HEALTH INSURANCE | Admitting: Family

## 2011-09-08 ENCOUNTER — Other Ambulatory Visit: Payer: PRIVATE HEALTH INSURANCE | Admitting: Lab

## 2011-09-25 ENCOUNTER — Telehealth: Payer: Self-pay | Admitting: *Deleted

## 2011-09-25 ENCOUNTER — Ambulatory Visit (HOSPITAL_BASED_OUTPATIENT_CLINIC_OR_DEPARTMENT_OTHER): Payer: PRIVATE HEALTH INSURANCE | Admitting: Oncology

## 2011-09-25 ENCOUNTER — Encounter: Payer: Self-pay | Admitting: Oncology

## 2011-09-25 ENCOUNTER — Other Ambulatory Visit (HOSPITAL_BASED_OUTPATIENT_CLINIC_OR_DEPARTMENT_OTHER): Payer: PRIVATE HEALTH INSURANCE | Admitting: Lab

## 2011-09-25 VITALS — BP 133/69 | HR 53 | Temp 98.4°F | Ht 63.0 in | Wt 142.2 lb

## 2011-09-25 DIAGNOSIS — C182 Malignant neoplasm of ascending colon: Secondary | ICD-10-CM

## 2011-09-25 DIAGNOSIS — C189 Malignant neoplasm of colon, unspecified: Secondary | ICD-10-CM

## 2011-09-25 DIAGNOSIS — C50919 Malignant neoplasm of unspecified site of unspecified female breast: Secondary | ICD-10-CM

## 2011-09-25 DIAGNOSIS — C50419 Malignant neoplasm of upper-outer quadrant of unspecified female breast: Secondary | ICD-10-CM

## 2011-09-25 DIAGNOSIS — Z170 Estrogen receptor positive status [ER+]: Secondary | ICD-10-CM

## 2011-09-25 LAB — CBC WITH DIFFERENTIAL/PLATELET
Basophils Absolute: 0 10*3/uL (ref 0.0–0.1)
Eosinophils Absolute: 0 10*3/uL (ref 0.0–0.5)
HGB: 11.7 g/dL (ref 11.6–15.9)
MCV: 83 fL (ref 79.5–101.0)
MONO%: 10.6 % (ref 0.0–14.0)
NEUT#: 1.6 10*3/uL (ref 1.5–6.5)
Platelets: 197 10*3/uL (ref 145–400)
RDW: 16.4 % — ABNORMAL HIGH (ref 11.2–14.5)

## 2011-09-25 LAB — COMPREHENSIVE METABOLIC PANEL
Albumin: 4.4 g/dL (ref 3.5–5.2)
CO2: 32 mEq/L (ref 19–32)
Calcium: 10.3 mg/dL (ref 8.4–10.5)
Glucose, Bld: 101 mg/dL — ABNORMAL HIGH (ref 70–99)
Potassium: 4.5 mEq/L (ref 3.5–5.3)
Sodium: 143 mEq/L (ref 135–145)
Total Bilirubin: 0.3 mg/dL (ref 0.3–1.2)
Total Protein: 6.8 g/dL (ref 6.0–8.3)

## 2011-09-25 LAB — CEA: CEA: 2.1 ng/mL (ref 0.0–5.0)

## 2011-09-25 MED ORDER — ANASTROZOLE 1 MG PO TABS: 1.0000 mg | ORAL_TABLET | Freq: Every day | ORAL | Status: AC

## 2011-09-25 NOTE — Progress Notes (Signed)
OFFICE PROGRESS NOTE  CC  Kristine Garbe, MD, MD 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 At her final pathology revealed a low grade invasive ductal carcinoma with with 0 of 2 lymph nodes positive for metastatic disease. The tumor was ER positive PR positive HER-2/neu negative.  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 Post lumpectomy patient went on to receive radiation therapy from 12/28/2010 2 01/18/2011 to the right breast. She thereafter received single agent Arimidex 1 mg daily starting on 02/06/2011.  #2 05/01/2011 patient developed generalized weakness shortness of breath and syncopal episode. She had a CT of the head performed that was negative. She had CBC done with that showed a hemoglobin of 6.7. She received 3 units of packed red cells. She was severely iron deficient. FOB was positive. Patient had a GI consult performed during her hospitalization and on 1212 she underwent a colonoscopy that revealed a near obstructing colonic mass worrisome for a hepatic flexure tumor. She had biopsies performed that revealed an adenocarcinoma. She went on to have a right segmental resection performed on 05/05/2011. The final pathology revealed a 5.0 cm poorly differentiated high grade invasive adenocarcinoma with patchy  mucinous features invading through the muscularis propria into pericolonic fat he tissue 34 lymph nodes were negative for metastatic disease. (T3 N0)  CURRENT THERAPY:  1. Breast cancer: arimidex 2. Colon Cancer: observation  INTERVAL HISTORY: [name] 66 y.o. female returns for followup visit today. The patient returns in followup today overall she is doing well she is having a little bit of problems with her arthritis. She is on pain medications for this. She does see her primary care physician for this. She is tolerating the Arimidex well without any significant complaints. She denies any nausea vomiting fevers chills night sweats no headaches. She has not noticed any changes in her bowel or bladder habits no hematuria hematochezia melena hemoptysis or hematemesis. Remainder of the 10 point review of systems is negative.  MEDICAL HISTORY: Past Medical History  Diagnosis Date  . Breast cancer   . Hypertension   . Depression   . Back pain, chronic   . Cancer of colon 05/24/2011  . Breast cancer 05/24/2011    ALLERGIES:   has no known allergies.  MEDICATIONS:  Current Outpatient Prescriptions  Medication Sig Dispense Refill  . ALPRAZolam (XANAX) 0.5 MG tablet Take 1 tablet (0.5 mg total) by mouth 2 (two) times daily as needed for anxiety. For anxiety  1 tablet  0  . anastrozole (ARIMIDEX) 1 MG tablet Take 1 mg by mouth at bedtime.        Marland Kitchen buPROPion (WELLBUTRIN XL) 300 MG 24 hr tablet Take 300 mg by mouth daily.        . fluvoxaMINE (LUVOX) 50 MG tablet Take 50-100 mg by mouth at bedtime. 1 tab in the am, 2 tabs in the pm       .  furosemide (LASIX) 20 MG tablet Take 2 tablets (40 mg total) by mouth 2 (two) times daily. Take two tablets once daily if needed for leg swelling.  1 tablet  0  . nebivolol (BYSTOLIC) 10 MG tablet Take 10 mg by mouth daily.        . Oxycodone HCl (OXYCONTIN) 60 MG TB12 Take 1 tablet by mouth 3 (three) times daily as needed. For pain       . potassium  chloride SA (K-DUR,KLOR-CON) 20 MEQ tablet Take 1 tablet (20 mEq total) by mouth daily.  5 tablet  0    SURGICAL HISTORY:  Past Surgical History  Procedure Date  . Back surgery   . Shoulder surgery   . Tumor removal Tumor removed R breast  . Lumbar disc surgery   . Partial hysterectomy   . Colonoscopy 05/03/2011    Procedure: COLONOSCOPY;  Surgeon: Jeryl Columbia, MD;  Location: [facility] ENDOSCOPY;  Service: Endoscopy;  Laterality: N/A;  . Colon surgery 04/2011  . Breast lumpectomy 10/10/2010    Rt Breast    REVIEW OF SYSTEMS:  As in the interim history   PHYSICAL EXAMINATION: Patient is awake alert in no acute distress.   HEENT exam: EOMI PERRLA sclerae anicteric no conjunctival pallor oral mucosa is moist.  Neck: Supple no palpable cervical supraclavicular or axillary adenopathy.  Lungs are clear bilaterally to auscultation and percussion cardiovascular  Cardiovascular: Regular rate rhythm no murmurs gallops or rubs.  Abdomen is soft nontender nondistended bowel sounds are present well-healed surgical scar. There is no hepatosplenomegaly.  Extremities +2 edema there is noted to be some tenderness bilaterally in the calf.  Neuro patient's alert oriented x3 otherwise nonfocal. Patient does has considerable amount of anxiety   ECOG PERFORMANCE STATUS: 1 - Symptomatic but completely ambulatory  Blood pressure 133/69, pulse 53, temperature 98.4 F (36.9 C), temperature source Oral, height 5\' 3"  (1.6 m), weight 142 lb 3.2 oz (64.501 kg).  LABORATORY DATA: Lab Results  Component Value Date   WBC 3.8* 09/25/2011   HGB 11.7 09/25/2011   HCT 36.0 09/25/2011   MCV 83.0 09/25/2011   PLT 197 09/25/2011      Chemistry      Component Value Date/Time   NA 142 06/09/2011 1106   K 3.8 06/09/2011 1106   CL 101 06/09/2011 1106   CO2 33* 06/09/2011 1106   BUN 47* 06/09/2011 1106   CREATININE 1.19* 06/09/2011 1106      Component Value Date/Time   CALCIUM 9.8 06/09/2011 1106   ALKPHOS 75 05/24/2011  1451   AST 19 05/24/2011 1451   ALT 13 05/24/2011 1451   BILITOT 0.2* 05/24/2011 1451       ASSESSMENT: 66 year old female with 2 primary malignancies:  1.  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  2.   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 Patient is seen in medical oncology for consideration of treatment options.    PLAN:  1. breast cancer: Patient will continue Arimidex 1 mg daily for a duration of 5 years.  #2 stage II adenocarcinoma of the colon: At this time no recommendations are made for adjuvant chemotherapy. Patient has a relatively good prognosis. The risks of treatments would far outweigh the benefits of adjuvant chemotherapy. All of this is discussed with the patient and her niece  and they do understand.  #3 patient will be seen back in 6 months time for followup.  All questions were answered. The patient knows to call the clinic with any problems, questions or concerns. We can certainly see the patient much sooner if necessary.  I spent 25 minutes counseling the patient face to face. The total time spent in the appointment was 30 minutes.    [name], MD Medical/Oncology [facility] [phone] (beeper) [phone] (Office)

## 2011-09-25 NOTE — Telephone Encounter (Signed)
gave patient appointment for 03-22-2012 starting at 03-27-2012 starting at 9:30am with labs

## 2011-09-25 NOTE — Patient Instructions (Signed)
1. I will see you back in 6 months

## 2011-09-27 ENCOUNTER — Telehealth: Payer: Self-pay | Admitting: Medical Oncology

## 2011-09-27 NOTE — Telephone Encounter (Signed)
Received call from patient stating "I am having awful burning pain in my left breast where Dr. Humphrey Rolls examined me on Monday.  My pain medicine is not helping this pain or the pain in my hip. What else can I do?"  Will review with MD.

## 2011-09-28 NOTE — Telephone Encounter (Signed)
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  Patient stated that she had an appointment with her primary doctor tomorrow. Patient asking when her next appointment was, "I was told by the schedulers that it would be a year and they would call me with my appointment."  Informed patient that next appointment was scheduled on 03/27/2012, lab @ 9:30 and Dr. Humphrey Rolls @ 10:00.  Patient requesting to change appointment time and date.  Referred patient to schedulers in order to get schedule changed.  Patient expressed understanding, no further questions.

## 2011-11-30 ENCOUNTER — Ambulatory Visit: Payer: PRIVATE HEALTH INSURANCE | Admitting: Radiation Oncology

## 2011-12-05 ENCOUNTER — Telehealth: Payer: Self-pay | Admitting: *Deleted

## 2011-12-05 ENCOUNTER — Other Ambulatory Visit: Payer: Self-pay | Admitting: Oncology

## 2011-12-05 DIAGNOSIS — Z853 Personal history of malignant neoplasm of breast: Secondary | ICD-10-CM

## 2011-12-05 NOTE — Telephone Encounter (Signed)
Crystal from scheduling here with pt request for a mammogram appt.  Called [facility] verified last mammo April /2012.  Order for Diagnostic mammogram/referral to [facility] needed for appt to be made. [facility] advised once order received they schedule and notify pt of  appt date/time.

## 2011-12-05 NOTE — Telephone Encounter (Signed)
Order done and pof sent to schedulers

## 2011-12-14 ENCOUNTER — Ambulatory Visit
Admission: RE | Admit: 2011-12-14 | Discharge: 2011-12-14 | Disposition: A | Discharge status: Home / Self Care | Payer: PRIVATE HEALTH INSURANCE | Source: Ambulatory Visit | Attending: Radiation Oncology | Admitting: Radiation Oncology

## 2011-12-14 ENCOUNTER — Encounter: Payer: Self-pay | Admitting: Radiation Oncology

## 2011-12-14 VITALS — BP 113/63 | HR 51 | Temp 98.1°F | Resp 18 | Wt 145.5 lb

## 2011-12-14 DIAGNOSIS — C50919 Malignant neoplasm of unspecified site of unspecified female breast: Secondary | ICD-10-CM

## 2011-12-14 NOTE — Progress Notes (Signed)
Patient presents to the clinic today unaccompanied for a follow up appointment with Dr. Pablo Ledger. Patient is alert and oriented to person, place, and time. No distress noted. Steady gait noted. Pleasant affect noted. Patient denies pain. Patient reports soreness of the right upper chest wall closest to the axilla. Skin of right/treated breast appears normal. Patient continues take Arimidex daily. Patient has no other complaints at this time related to her breast. Reported all findings to Dr. Pablo Ledger.   8/6 follow up with Ts[facility] 11/11 follow up with Humphrey Rolls   Does NOT have mammogram set up yet. Patient reports she has called twice in an attempt to arrange this.

## 2011-12-14 NOTE — Progress Notes (Signed)
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  She is feeling better. She reports that her right arm is not hurting her any longer. She still has some soreness in her right breast. This bothers her particularly when she wears her bra or when she presses very hard on this breast. She reports she can sleep at night. She's not having shooting pain that she was having before. She does not recall having a appointment with physical therapy. She doesn't recall being in severe pain the patient was last time I saw her. She continues on her Arimidex. She has a followup appointment Dr. [name]. in August and with Dr. Humphrey Rolls in November. She has not had mammograms at.   Allergies: No Known Allergies  Medications:  Current Outpatient Prescriptions  Medication Sig Dispense Refill  . ALPRAZolam (XANAX) 0.5 MG tablet Take 1 tablet (0.5 mg total) by mouth 2 (two) times daily as needed for anxiety. For anxiety  1 tablet  0  . anastrozole (ARIMIDEX) 1 MG tablet Take 1 mg by mouth at bedtime.        Marland Kitchen buPROPion (WELLBUTRIN XL) 300 MG 24 hr tablet Take 300 mg by mouth daily.        . fluvoxaMINE (LUVOX) 50 MG tablet Take 50-100 mg by mouth at bedtime. 1 tab in the am, 2 tabs in the pm       . furosemide (LASIX) 20 MG tablet Take 2 tablets (40 mg total) by mouth 2 (two) times daily. Take two tablets once daily if needed for leg swelling.  1 tablet  0  . nebivolol (BYSTOLIC) 10 MG tablet Take 10 mg by mouth daily.        . Oxycodone HCl (OXYCONTIN) 60 MG TB12 Take 1 tablet by mouth 3 (three) times daily as needed. For pain       . potassium chloride SA (K-DUR,KLOR-CON) 20 MEQ tablet Take 1 tablet (20 mEq  total) by mouth daily.  5 tablet  0    Physical Exam:   weight is 145 lb 8 oz (65.998 kg). Her oral temperature is 98.1 F (36.7 C). Her blood pressure is 113/63 and her pulse is 51. Her respiration is 18.  On physical examination she is a pleasant female in no distress sitting comfortably examining table. She has no palpable abnormalities the left breast. She has some tenderness in the right axilla but no palpable lymph nodes. Nothing palpable in the left axilla. On she is very tender to palpation over her scar. At the most superior and medial aspect of her scar am able to palpate approximately 1 cm area of hard tissue which appears fixed to the chest wall. I don't know if this is scar tissue or possibly a focus of recurrent disease.  IMPRESSION: Holly is a 66 y.o. female with improved but continued right breast pain.  PLAN:  She is one year out from treatment. She has not had mammograms since the end of treatment. We have call to schedule those for next week and I have ordered a right breast ultrasound to evaluate this area of concern. She will continue her followup with Dr. Humphrey Rolls the  first week of August. I scheduled followup with her in 6 months. Obviously will have to move that up with her something concerning on her mammogram or ultrasound.    Thea Silversmith, MD

## 2011-12-26 ENCOUNTER — Ambulatory Visit (INDEPENDENT_AMBULATORY_CARE_PROVIDER_SITE_OTHER): Payer: PRIVATE HEALTH INSURANCE | Admitting: Surgery

## 2011-12-26 ENCOUNTER — Encounter (INDEPENDENT_AMBULATORY_CARE_PROVIDER_SITE_OTHER): Payer: Self-pay | Admitting: Surgery

## 2011-12-26 VITALS — BP 132/74 | HR 70 | Temp 97.8°F | Resp 14 | Ht 63.5 in | Wt 153.2 lb

## 2011-12-26 DIAGNOSIS — C189 Malignant neoplasm of colon, unspecified: Secondary | ICD-10-CM

## 2011-12-26 NOTE — Progress Notes (Signed)
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  She continues Arimidex for her breast cancer.  She comes in today because of persistent abdominal distention and generalized discomfort.  She denies any nausea.  She reports that she only has bowel movements every 3-4 days, and frequently requires the use of Miralax to help her have a bowel movement.  She denies any bulging at her incision.  She also reports fairly severe left hip pain, which is sporadic, but increasing in severity  Filed Vitals:   12/26/11 1457  BP: 132/74  Pulse: 70  Temp: 97.8 F (36.6 C)  Resp: 14   PE;  Lungs - CTA B CV - RRR Abd - mildly distended; mild generalized tenderness; no peritonitis; Healed midline incision - no sign of ventral hernia Other port sites - well-healed    Imp:  Abdominal discomfort is likely secondary to her chronic constipation.    Left hip pain  Plan:  Daily miralax Plain films of the abdomen and the left hip  She may require referral to Ortho if the hip x-rays are positive  Follow-up 1 week.  [name]. [name], MD, [facility]  12/26/2011 8:49 PM

## 2011-12-27 ENCOUNTER — Telehealth: Payer: Self-pay | Admitting: Radiation Oncology

## 2011-12-27 NOTE — Telephone Encounter (Signed)
Phoned patient at home number listed in demographic and informed her as ordered by Dr. Pablo Ledger that her recent breast mammogram and ultrasound were negative. Patient verbalized understanding. Patient requested that this writer inform Dr. Pablo Ledger she will be having several scans of Monday of her abdomen and hip due to problems stemming from her colon surgery. Patient reports these scans were ordered by Dr. [name]. Will route this message to Dr. Pablo Ledger and watch for results of upcoming scans. Encouraged patient to call with future needs and she verbalized understanding.

## 2012-01-01 ENCOUNTER — Ambulatory Visit
Admission: RE | Admit: 2012-01-01 | Discharge: 2012-01-01 | Disposition: A | Discharge status: Home / Self Care | Payer: PRIVATE HEALTH INSURANCE | Source: Ambulatory Visit | Attending: Surgery | Admitting: Surgery

## 2012-01-01 ENCOUNTER — Other Ambulatory Visit: Payer: Self-pay | Admitting: Family Medicine

## 2012-01-01 DIAGNOSIS — C189 Malignant neoplasm of colon, unspecified: Secondary | ICD-10-CM

## 2012-01-01 DIAGNOSIS — R52 Pain, unspecified: Secondary | ICD-10-CM

## 2012-01-02 ENCOUNTER — Telehealth (INDEPENDENT_AMBULATORY_CARE_PROVIDER_SITE_OTHER): Payer: Self-pay | Admitting: General Surgery

## 2012-01-02 NOTE — Progress Notes (Signed)
Quick Note:  Please call the patient and let them know that their x-rays are normal - no problems with the left hip. No significant constipation. Continue Miralax daily.  ______

## 2012-01-02 NOTE — Telephone Encounter (Signed)
Called pt this morning and told her about her test results and she stated that she may or may not keep her appt with Dr [name] on Thursday 01-04-2012 due to having a ride, she will call the office to let us know if she will be coming in or not

## 2012-01-04 ENCOUNTER — Encounter (INDEPENDENT_AMBULATORY_CARE_PROVIDER_SITE_OTHER): Payer: PRIVATE HEALTH INSURANCE | Admitting: Surgery

## 2012-03-24 ENCOUNTER — Inpatient Hospital Stay (HOSPITAL_COMMUNITY)
Admission: EM | Admit: 2012-03-24 | Discharge: 2012-03-30 | DRG: 600 | Disposition: A | Discharge status: Home / Self Care | Payer: PRIVATE HEALTH INSURANCE | Attending: Internal Medicine | Admitting: Internal Medicine

## 2012-03-24 ENCOUNTER — Encounter (HOSPITAL_COMMUNITY): Payer: Self-pay | Admitting: Emergency Medicine

## 2012-03-24 ENCOUNTER — Emergency Department (HOSPITAL_COMMUNITY): Payer: PRIVATE HEALTH INSURANCE

## 2012-03-24 DIAGNOSIS — C50919 Malignant neoplasm of unspecified site of unspecified female breast: Secondary | ICD-10-CM

## 2012-03-24 DIAGNOSIS — C50911 Malignant neoplasm of unspecified site of right female breast: Secondary | ICD-10-CM | POA: Diagnosis present

## 2012-03-24 DIAGNOSIS — B029 Zoster without complications: Secondary | ICD-10-CM | POA: Diagnosis present

## 2012-03-24 DIAGNOSIS — C189 Malignant neoplasm of colon, unspecified: Secondary | ICD-10-CM

## 2012-03-24 DIAGNOSIS — N61 Mastitis without abscess: Principal | ICD-10-CM | POA: Diagnosis present

## 2012-03-24 DIAGNOSIS — F329 Major depressive disorder, single episode, unspecified: Secondary | ICD-10-CM | POA: Diagnosis present

## 2012-03-24 DIAGNOSIS — Z79899 Other long term (current) drug therapy: Secondary | ICD-10-CM

## 2012-03-24 DIAGNOSIS — M25519 Pain in unspecified shoulder: Secondary | ICD-10-CM | POA: Diagnosis present

## 2012-03-24 DIAGNOSIS — G8929 Other chronic pain: Secondary | ICD-10-CM | POA: Diagnosis present

## 2012-03-24 DIAGNOSIS — L538 Other specified erythematous conditions: Secondary | ICD-10-CM

## 2012-03-24 DIAGNOSIS — F32A Depression, unspecified: Secondary | ICD-10-CM | POA: Diagnosis present

## 2012-03-24 DIAGNOSIS — F3289 Other specified depressive episodes: Secondary | ICD-10-CM | POA: Diagnosis present

## 2012-03-24 DIAGNOSIS — L539 Erythematous condition, unspecified: Secondary | ICD-10-CM | POA: Diagnosis present

## 2012-03-24 DIAGNOSIS — E871 Hypo-osmolality and hyponatremia: Secondary | ICD-10-CM | POA: Diagnosis present

## 2012-03-24 DIAGNOSIS — I10 Essential (primary) hypertension: Secondary | ICD-10-CM | POA: Diagnosis present

## 2012-03-24 DIAGNOSIS — F411 Generalized anxiety disorder: Secondary | ICD-10-CM | POA: Diagnosis present

## 2012-03-24 DIAGNOSIS — M549 Dorsalgia, unspecified: Secondary | ICD-10-CM | POA: Diagnosis present

## 2012-03-24 DIAGNOSIS — F341 Dysthymic disorder: Secondary | ICD-10-CM

## 2012-03-24 DIAGNOSIS — M25511 Pain in right shoulder: Secondary | ICD-10-CM | POA: Diagnosis present

## 2012-03-24 DIAGNOSIS — Z87891 Personal history of nicotine dependence: Secondary | ICD-10-CM

## 2012-03-24 DIAGNOSIS — Z9010 Acquired absence of unspecified breast and nipple: Secondary | ICD-10-CM

## 2012-03-24 DIAGNOSIS — N179 Acute kidney failure, unspecified: Secondary | ICD-10-CM | POA: Diagnosis present

## 2012-03-24 DIAGNOSIS — Z923 Personal history of irradiation: Secondary | ICD-10-CM

## 2012-03-24 DIAGNOSIS — D649 Anemia, unspecified: Secondary | ICD-10-CM

## 2012-03-24 DIAGNOSIS — N63 Unspecified lump in unspecified breast: Secondary | ICD-10-CM

## 2012-03-24 DIAGNOSIS — Z9049 Acquired absence of other specified parts of digestive tract: Secondary | ICD-10-CM

## 2012-03-24 DIAGNOSIS — Z85038 Personal history of other malignant neoplasm of large intestine: Secondary | ICD-10-CM

## 2012-03-24 LAB — COMPREHENSIVE METABOLIC PANEL WITH GFR
ALT: 10 U/L (ref 0–35)
AST: 19 U/L (ref 0–37)
Albumin: 3.8 g/dL (ref 3.5–5.2)
Alkaline Phosphatase: 118 U/L — ABNORMAL HIGH (ref 39–117)
BUN: 18 mg/dL (ref 6–23)
CO2: 28 meq/L (ref 19–32)
Calcium: 10.2 mg/dL (ref 8.4–10.5)
Chloride: 93 meq/L — ABNORMAL LOW (ref 96–112)
Creatinine, Ser: 1.17 mg/dL — ABNORMAL HIGH (ref 0.50–1.10)
GFR calc Af Amer: 55 mL/min — ABNORMAL LOW (ref >90)
GFR calc non Af Amer: 48 mL/min — ABNORMAL LOW (ref >90)
Glucose, Bld: 145 mg/dL — ABNORMAL HIGH (ref 70–99)
Potassium: 3.4 meq/L — ABNORMAL LOW (ref 3.5–5.1)
Sodium: 134 meq/L — ABNORMAL LOW (ref 135–145)
Total Bilirubin: 0.7 mg/dL (ref 0.3–1.2)
Total Protein: 7.1 g/dL (ref 6.0–8.3)

## 2012-03-24 LAB — CBC WITH DIFFERENTIAL/PLATELET
Basophils Absolute: 0 10*3/uL (ref 0.0–0.1)
Lymphs Abs: 1.4 10*3/uL (ref 0.7–4.0)
MCH: 29.1 pg (ref 26.0–34.0)
MCHC: 33.6 g/dL (ref 30.0–36.0)
MCV: 86.6 fL (ref 78.0–100.0)
Monocytes Absolute: 0.8 10*3/uL (ref 0.1–1.0)
Monocytes Relative: 10 % (ref 3–12)
Neutrophils Relative %: 74 % (ref 43–77)
Platelets: 202 10*3/uL (ref 150–400)

## 2012-03-24 MED ORDER — VANCOMYCIN HCL IN DEXTROSE 1-5 GM/200ML-% IV SOLN
1000.0000 mg | Freq: Once | INTRAVENOUS | Status: AC
  Administered 2012-03-24: 1000 mg via INTRAVENOUS
  Filled 2012-03-24: qty 200

## 2012-03-24 MED ORDER — OXYCODONE HCL 60 MG PO TB12: 1.0000 | ORAL_TABLET | Freq: Three times a day (TID) | ORAL | Status: DC | PRN

## 2012-03-24 MED ORDER — FENTANYL CITRATE 0.05 MG/ML IJ SOLN: 50.0000 ug | Freq: Once | INTRAMUSCULAR | Status: DC

## 2012-03-24 MED ORDER — ACETAMINOPHEN 325 MG PO TABS
650.0000 mg | ORAL_TABLET | Freq: Four times a day (QID) | ORAL | Status: DC | PRN
  Administered 2012-03-25: 650 mg via ORAL
  Filled 2012-03-24: qty 2

## 2012-03-24 MED ORDER — FUROSEMIDE 40 MG PO TABS
40.0000 mg | ORAL_TABLET | Freq: Two times a day (BID) | ORAL | Status: DC
  Administered 2012-03-24 – 2012-03-27 (×5): 40 mg via ORAL
  Filled 2012-03-24 (×8): qty 1

## 2012-03-24 MED ORDER — ALPRAZOLAM 0.5 MG PO TABS
0.5000 mg | ORAL_TABLET | Freq: Two times a day (BID) | ORAL | Status: DC | PRN
  Administered 2012-03-25 – 2012-03-29 (×4): 0.5 mg via ORAL
  Filled 2012-03-24 (×4): qty 1

## 2012-03-24 MED ORDER — MORPHINE SULFATE 4 MG/ML IJ SOLN
4.0000 mg | INTRAMUSCULAR | Status: DC | PRN
  Administered 2012-03-25: 4 mg via INTRAVENOUS
  Filled 2012-03-24: qty 1

## 2012-03-24 MED ORDER — BUPROPION HCL ER (XL) 300 MG PO TB24
300.0000 mg | ORAL_TABLET | Freq: Every day | ORAL | Status: DC
  Administered 2012-03-25 – 2012-03-29 (×5): 300 mg via ORAL
  Filled 2012-03-24 (×6): qty 1

## 2012-03-24 MED ORDER — POLYETHYLENE GLYCOL 3350 17 G PO PACK
17.0000 g | PACK | ORAL | Status: DC
  Administered 2012-03-26: 17 g via ORAL
  Filled 2012-03-24 (×4): qty 1

## 2012-03-24 MED ORDER — ANASTROZOLE 1 MG PO TABS
1.0000 mg | ORAL_TABLET | Freq: Every day | ORAL | Status: DC
  Administered 2012-03-24 – 2012-03-29 (×6): 1 mg via ORAL
  Filled 2012-03-24 (×7): qty 1

## 2012-03-24 MED ORDER — ACETAMINOPHEN 650 MG RE SUPP: 650.0000 mg | Freq: Four times a day (QID) | RECTAL | Status: DC | PRN

## 2012-03-24 MED ORDER — POTASSIUM GLUCONATE 595 MG PO CAPS: 595.0000 mg | ORAL_CAPSULE | Freq: Every morning | ORAL | Status: DC

## 2012-03-24 MED ORDER — DEXTROSE 5 % IV SOLN
530.0000 mg | Freq: Two times a day (BID) | INTRAVENOUS | Status: DC
  Administered 2012-03-24 – 2012-03-29 (×10): 530 mg via INTRAVENOUS
  Filled 2012-03-24 (×10): qty 10.6

## 2012-03-24 MED ORDER — SODIUM CHLORIDE 0.9 % IV SOLN
Freq: Once | INTRAVENOUS | Status: AC
  Administered 2012-03-24: 13:00:00 via INTRAVENOUS

## 2012-03-24 MED ORDER — MORPHINE SULFATE 4 MG/ML IJ SOLN
4.0000 mg | Freq: Once | INTRAMUSCULAR | Status: AC
  Administered 2012-03-24: 4 mg via INTRAVENOUS
  Filled 2012-03-24: qty 1

## 2012-03-24 MED ORDER — HEPARIN SODIUM (PORCINE) 5000 UNIT/ML IJ SOLN
5000.0000 [IU] | Freq: Three times a day (TID) | INTRAMUSCULAR | Status: DC
  Administered 2012-03-24 – 2012-03-29 (×12): 5000 [IU] via SUBCUTANEOUS
  Filled 2012-03-24 (×20): qty 1

## 2012-03-24 MED ORDER — SODIUM CHLORIDE 0.9 % IV SOLN: INTRAVENOUS | Status: AC

## 2012-03-24 MED ORDER — FLUVOXAMINE MALEATE 100 MG PO TABS
100.0000 mg | ORAL_TABLET | Freq: Every day | ORAL | Status: DC
  Administered 2012-03-24 – 2012-03-29 (×6): 100 mg via ORAL
  Filled 2012-03-24: qty 2
  Filled 2012-03-24 (×6): qty 1

## 2012-03-24 MED ORDER — FLUVOXAMINE MALEATE 50 MG PO TABS
50.0000 mg | ORAL_TABLET | Freq: Every day | ORAL | Status: DC
  Administered 2012-03-25 – 2012-03-29 (×5): 50 mg via ORAL
  Filled 2012-03-24 (×6): qty 1

## 2012-03-24 MED ORDER — DOCUSATE SODIUM 100 MG PO CAPS
200.0000 mg | ORAL_CAPSULE | Freq: Every day | ORAL | Status: DC
  Administered 2012-03-25 – 2012-03-28 (×4): 200 mg via ORAL
  Filled 2012-03-24 (×8): qty 2

## 2012-03-24 MED ORDER — POTASSIUM GLUCONATE 595 (99 K) MG PO TABS
595.0000 mg | ORAL_TABLET | Freq: Every day | ORAL | Status: DC
  Administered 2012-03-25 – 2012-03-29 (×5): 595 mg via ORAL
  Filled 2012-03-24 (×6): qty 1

## 2012-03-24 MED ORDER — NEBIVOLOL HCL 10 MG PO TABS
10.0000 mg | ORAL_TABLET | Freq: Every day | ORAL | Status: DC
  Administered 2012-03-25 – 2012-03-29 (×5): 10 mg via ORAL
  Filled 2012-03-24 (×6): qty 1

## 2012-03-24 MED ORDER — OXYCODONE HCL ER 20 MG PO T12A
60.0000 mg | EXTENDED_RELEASE_TABLET | Freq: Two times a day (BID) | ORAL | Status: DC
  Administered 2012-03-24 – 2012-03-26 (×4): 60 mg via ORAL
  Filled 2012-03-24: qty 2
  Filled 2012-03-24: qty 1
  Filled 2012-03-24: qty 2
  Filled 2012-03-24: qty 1
  Filled 2012-03-24: qty 6

## 2012-03-24 MED ORDER — VANCOMYCIN HCL 1000 MG IV SOLR
1250.0000 mg | INTRAVENOUS | Status: DC
  Administered 2012-03-25 – 2012-03-28 (×4): 1250 mg via INTRAVENOUS
  Filled 2012-03-24 (×4): qty 1250

## 2012-03-24 MED ORDER — HYDROMORPHONE HCL PF 1 MG/ML IJ SOLN
1.0000 mg | INTRAMUSCULAR | Status: DC | PRN
  Administered 2012-03-24 – 2012-03-25 (×2): 1 mg via INTRAVENOUS
  Filled 2012-03-24 (×2): qty 1

## 2012-03-24 NOTE — ED Notes (Signed)
Pt refused RN offer of pain med stating "i dont like how it makes me feel, I'll hold off for now".

## 2012-03-24 NOTE — H&P (Signed)
[facility] History and Physical  [name] [id number] DOB: Jun 28, 1945 DOA: 03/24/2012  Referring physician: Dr. Rogene Houston PCP: Kristine Garbe, MD  Specialists: none  Chief Complaint: breast pain and redness  HPI: [name] is a 66 y.o. female  With past medical history of breast cancer status post lumpectomy and radiation therapy to the right breast diagnosed on 4 2012 with last treatment on the seventh 2012, also past medical history of right side colon cancer, depression comes in for right breast tenderness and erythema 1 day prior to admission. She relates it all started with small blisters about 4 days prior to admission which migrated from side to side. She didn't develop erythema around her breasts and her nipple with small pearly white blisters. It then became so sensitive that she can even walk or put on the breast due to the severe pain. She relates no fever chills nausea vomiting or diarrhea, no breast mass, no falls.  Review of Systems: The patient denies anorexia, fever, weight loss,, vision loss, decreased hearing, hoarseness, chest pain, syncope, dyspnea on exertion, peripheral edema, balance deficits, hemoptysis, abdominal pain, melena, hematochezia, severe indigestion/heartburn, hematuria, incontinence, genital sores, muscle weakness, suspicious skin lesions, transient blindness, difficulty walking, depression, unusual weight change, abnormal bleeding, enlarged lymph nodes, angioedema, and breast masses.    Past Medical History  Diagnosis Date  . Hypertension   . Depression   . Back pain, chronic   . Breast cancer 09/09/10  . Cancer of colon 05/24/2011   Past Surgical History  Procedure Date  . Back surgery   . Shoulder surgery   . Tumor removal Tumor removed R breast  . Lumbar disc surgery   . Partial hysterectomy   . Colonoscopy 05/03/2011    Procedure: COLONOSCOPY;  Surgeon: Jeryl Columbia, MD;  Location: [facility] ENDOSCOPY;  Service: Endoscopy;   Laterality: N/A;  . Colon surgery 04/2011  . Breast lumpectomy 10/10/2010    Rt Breast  . Joint replacement     R&L total shoulder replacements   Social History:  reports that she has quit smoking. Her smoking use included Cigarettes. She smoked 1 pack per day. She has never used smokeless tobacco. She reports that she does not drink alcohol or use illicit drugs. Lives at home with daughter can perform all her ADLs  No Known Allergies  Family History  Problem Relation Age of Onset  . Diabetes Mother   . Hypertension Mother   . Cancer Mother   . Sudden death Mother   . Anesthesia problems Neg Hx   . Hypotension Neg Hx   . Malignant hyperthermia Neg Hx   . Pseudochol deficiency Neg Hx     Prior to Admission medications   Medication Sig Start Date End Date Taking? Authorizing Provider  ALPRAZolam Duanne Moron) 0.5 MG tablet Take 1 tablet (0.5 mg total) by mouth 2 (two) times daily as needed for anxiety. For anxiety 05/09/11  Yes Bobby Rumpf York, PA  anastrozole (ARIMIDEX) 1 MG tablet Take 1 mg by mouth at bedtime.     Yes Historical Provider, MD  buPROPion (WELLBUTRIN XL) 300 MG 24 hr tablet Take 300 mg by mouth daily.     Yes Historical Provider, MD  docusate sodium (COLACE) 100 MG capsule Take 200 mg by mouth at bedtime.   Yes Historical Provider, MD  fluvoxaMINE (LUVOX) 50 MG tablet Take 50-100 mg by mouth at bedtime. 1 tab in the am, 2 tabs in the pm    Yes Historical Provider,  MD  furosemide (LASIX) 20 MG tablet Take 2 tablets (40 mg total) by mouth 2 (two) times daily. Take two tablets once daily if needed for leg swelling. 05/09/11  Yes Marianne L York, PA  nebivolol (BYSTOLIC) 10 MG tablet Take 10 mg by mouth daily.     Yes Historical Provider, MD  Oxycodone HCl (OXYCONTIN) 60 MG TB12 Take 1 tablet by mouth 3 (three) times daily as needed. For pain    Yes Historical Provider, MD  polyethylene glycol (MIRALAX / GLYCOLAX) packet Take 17 g by mouth every other day.   Yes Historical  Provider, MD  Potassium Gluconate 595 MG CAPS Take 595 mg by mouth every morning.   Yes Historical Provider, MD   Physical Exam: Filed Vitals:   03/24/12 1156 03/24/12 1300  BP: 139/75 122/54  Pulse: 62 52  Temp: 98.3 F (36.8 C) 97.7 F (36.5 C)  TempSrc: Oral Oral  Resp: 14   SpO2: 96%      General:  Awake alert and oriented x3  Eyes: Anicteric  ENT: Moist mucous membranes  Neck: No JVD  Cardiovascular: Regular rate and rhythm with positive S1 and S2  Respiratory: Good air movement and clear to auscultation  Abdomen: Positive bowel sounds nontender nondistended soft  Skin: Her right breast is beefy red exquisitely tender to palpation no masses or fluctuation, she has small pearly papules in mid chest. Examination of the left breast does not reveal any erythema or tenderness. The right breast is warm to touch.  Musculoskeletal: No joint deformities  Psychiatric: Appropriate  Neurologic: Awake alert and oriented x3 nonfocal  Labs on Admission:  Basic Metabolic Panel:  Lab [id number] 1222  NA 134*  K 3.4*  CL 93*  CO2 28  GLUCOSE 145*  BUN 18  CREATININE 1.17*  CALCIUM 10.2  MG --  PHOS --   Liver Function Tests:  Lab 03/24/12 1222  AST 19  ALT 10  ALKPHOS 118*  BILITOT 0.7  PROT 7.1  ALBUMIN 3.8   No results found for this basename: LIPASE:5,AMYLASE:5 in the last 168 hours No results found for this basename: AMMONIA:5 in the last 168 hours CBC:  Lab 03/24/12 1222  WBC 8.2  NEUTROABS 6.0  HGB 12.6  HCT 37.5  MCV 86.6  PLT 202   Cardiac Enzymes: No results found for this basename: CKTOTAL:5,CKMB:5,CKMBINDEX:5,TROPONINI:5 in the last 168 hours  BNP (last 3 results)  Basename 05/02/11 1145  PROBNP 3468.0*   CBG: No results found for this basename: GLUCAP:5 in the last 168 hours  Radiological Exams on Admission: Dg Chest 2 View  03/24/2012  *RADIOLOGY REPORT*  Clinical Data: Chest pain.  CHEST - 2 VIEW  Comparison: 05/02/2011.   Findings: The cardiac silhouette, mediastinal and hilar contours are within normal limits and stable.  The lungs are clear.  Remote rib fractures are again demonstrated.  Bilateral shoulder prostheses are noted.  IMPRESSION: No acute cardiopulmonary findings.   Original Report Authenticated By: Marijo Sanes, M.D.     EKG: Independently reviewed. none  Assessment/Plan Principal Problem: Breast erythema: -mpressive erythema (beefy red) of the right breast, exquisitely tender to palpation with examination the patient started crying. We'll go ahead and start her on IV vancomycin. The degree of soreness is out of proportion to physical examination. She has the small pearly blisters about 1 mm in size in the central part of her chest which makes me concerned for varicella-zoster. Goals will start her on acyclovir. We'll also get blood cultures  x2. We'll start her on narcotics and NSAIDs to try to control the pain.  HTN (hypertension): -Blood pressure seems to be stable will continue home meds. Hold her Lasix as she seems hyponatremic. -We'll start her on IV fluids check a basic metabolic panel in the morning and can restart her Lasix tomorrow morning.   Anxiety and depression -Continue home meds.  Hyponatremia: -She seems to be euvolemic from exam. Probable and check orthostatic vitals. We'll start her on IV fluids and check a basic metabolic panel in the morning. She is also hypokalemic which is compatible with decreased intravascular volume.  Code Status: full Family Communication: daughter Disposition Plan: home in 2-3 days (indicate anticipated LOS)  Time spent: 60 minutes  Charlynne Cousins [facility] Pager [phone]  If 7PM-7AM, please contact night-coverage [email or web address] Password [facility] 03/24/2012, 3:28 PM

## 2012-03-24 NOTE — ED Notes (Signed)
Pt presents w/ right breast pain onset Thursday at old lumpectomy surgical site. Now entire right breast is red, hot and very tender to touch, and appears to be cellulitis. Breast CA dx September 09, 2010. Pt treated w/ radiation only.

## 2012-03-24 NOTE — ED Provider Notes (Signed)
History     CSN: [id number]  Arrival date & time 03/24/12  1144   First MD Initiated Contact with Patient 03/24/12 1225      Chief Complaint  Patient presents with  . Breast Pain    (Consider location/radiation/quality/duration/timing/severity/associated sxs/prior treatment) HPI Comments: Pt come in today complaining of right sided breast pain with redness that started 2 days ago:pt states that she had pain at the incision site for the last week, but the redness didn't start till 2 days ago and the redness and pain has gotten a lot worse in the last 24 hours:pt denies drainage for firmness:pt states that she is due for a follow up with oncology in 2 weeks:pt states that she has been out of treatment for a breast cancer since last year:pt denies fever  The history is provided by the patient. No language interpreter was used.    Past Medical History  Diagnosis Date  . Hypertension   . Depression   . Back pain, chronic   . Breast cancer 09/09/10  . Cancer of colon 05/24/2011    Past Surgical History  Procedure Date  . Back surgery   . Shoulder surgery   . Tumor removal Tumor removed R breast  . Lumbar disc surgery   . Partial hysterectomy   . Colonoscopy 05/03/2011    Procedure: COLONOSCOPY;  Surgeon: Jeryl Columbia, MD;  Location: [facility] ENDOSCOPY;  Service: Endoscopy;  Laterality: N/A;  . Colon surgery 04/2011  . Breast lumpectomy 10/10/2010    Rt Breast  . Joint replacement     R&L total shoulder replacements    Family History  Problem Relation Age of Onset  . Diabetes Mother   . Hypertension Mother   . Cancer Mother   . Anesthesia problems Neg Hx   . Hypotension Neg Hx   . Malignant hyperthermia Neg Hx   . Pseudochol deficiency Neg Hx     History  Substance Use Topics  . Smoking status: Former Research scientist (life sciences)  . Smokeless tobacco: Never Used  . Alcohol Use: No    OB History    Grav Para Term Preterm Abortions TAB SAB Ect Mult Living                  Review of  Systems  Constitutional: Negative.   Respiratory: Negative.   Neurological: Negative.     Allergies  Review of patient's allergies indicates no known allergies.  Home Medications   Current Outpatient Rx  Name  Route  Sig  Dispense  Refill  . ALPRAZOLAM 0.5 MG PO TABS   Oral   Take 1 tablet (0.5 mg total) by mouth 2 (two) times daily as needed for anxiety. For anxiety   1 tablet   0   . ANASTROZOLE 1 MG PO TABS   Oral   Take 1 mg by mouth at bedtime.           . BUPROPION HCL ER (XL) 300 MG PO TB24   Oral   Take 300 mg by mouth daily.           Marland Kitchen DOCUSATE SODIUM 100 MG PO CAPS   Oral   Take 200 mg by mouth at bedtime.         Marland Kitchen FLUVOXAMINE MALEATE 50 MG PO TABS   Oral   Take 50-100 mg by mouth at bedtime. 1 tab in the am, 2 tabs in the pm          . FUROSEMIDE 20  MG PO TABS   Oral   Take 2 tablets (40 mg total) by mouth 2 (two) times daily. Take two tablets once daily if needed for leg swelling.   1 tablet   0   . NEBIVOLOL HCL 10 MG PO TABS   Oral   Take 10 mg by mouth daily.           . OXYCODONE HCL ER 60 MG PO TB12   Oral   Take 1 tablet by mouth 3 (three) times daily as needed. For pain          . POLYETHYLENE GLYCOL 3350 PO PACK   Oral   Take 17 g by mouth every other day.         Marland Kitchen POTASSIUM GLUCONATE 595 MG PO CAPS   Oral   Take 595 mg by mouth every morning.           BP 139/75  Pulse 62  Temp 98.3 F (36.8 C) (Oral)  Resp 14  SpO2 96%  Physical Exam  Nursing note and vitals reviewed. Constitutional: She is oriented to person, place, and time. She appears well-developed and well-nourished.  HENT:  Head: Normocephalic and atraumatic.  Cardiovascular: Normal rate and regular rhythm.   Pulmonary/Chest: Effort normal and breath sounds normal.  Musculoskeletal: Normal range of motion.  Neurological: She is alert and oriented to person, place, and time.  Skin:       Pt has redness and warmth noted to the whole right breast  with extension into the center chest wall and toward incision at the lateral site:no localized firmness noted    ED Course  Procedures (including critical care time)  Labs Reviewed  COMPREHENSIVE METABOLIC PANEL - Abnormal; Notable for the following:    Sodium 134 (*)     Potassium 3.4 (*)     Chloride 93 (*)     Glucose, Bld 145 (*)     Creatinine, Ser 1.17 (*)     Alkaline Phosphatase 118 (*)     GFR calc non Af Amer 48 (*)     GFR calc Af Amer 55 (*)     All other components within normal limits  CBC WITH DIFFERENTIAL  CBC  CREATININE, SERUM  CULTURE, BLOOD (ROUTINE X 2)  CULTURE, BLOOD (ROUTINE X 2)   Dg Chest 2 View  03/24/2012  *RADIOLOGY REPORT*  Clinical Data: Chest pain.  CHEST - 2 VIEW  Comparison: 05/02/2011.  Findings: The cardiac silhouette, mediastinal and hilar contours are within normal limits and stable.  The lungs are clear.  Remote rib fractures are again demonstrated.  Bilateral shoulder prostheses are noted.  IMPRESSION: No acute cardiopulmonary findings.   Original Report Authenticated By: Marijo Sanes, M.D.      1. Breast erythema   2. Anxiety and depression   3. Hyponatremia       MDM  Pt is to be admitted to hospital for cellulitis treatment        Glendell Docker, NP 03/24/12 1633

## 2012-03-24 NOTE — ED Notes (Addendum)
Right breast cellulitis marked per MD request

## 2012-03-24 NOTE — ED Notes (Signed)
ID called regarding contact precautions for pt placement - placed on contact orange and airborne in ED, needs negative pressure room for admission. MD notified, room placement in progress

## 2012-03-24 NOTE — ED Notes (Signed)
3w called for report - nurses still in huddle

## 2012-03-24 NOTE — ED Notes (Signed)
Patient transported to X-ray 

## 2012-03-24 NOTE — ED Provider Notes (Addendum)
Medical screening examination/treatment/procedure(s) were conducted as a shared visit with non-physician practitioner(s) and myself.  I personally evaluated the patient during the encounter   Most likely Cellulitis but could be related to past hx of breast CA, recommend IV vancomycin for cellulitis and admission , by medicine and surgery consult.         Mervin Kung, MD 03/24/12 1400  Mervin Kung, MD 03/24/12 [phone]

## 2012-03-24 NOTE — Progress Notes (Signed)
ANTIBIOTIC CONSULT NOTE - INITIAL  Pharmacy Consult for Acyclovir Indication: Breast erythema, concern for zoster  No Known Allergies  Patient Measurements: Height: 5\' 3"  (160 cm) Weight: 153 lb (69.4 kg) IBW/kg (Calculated) : 52.4    Vital Signs: Temp: 97.7 F (36.5 C) (11/03 1300) Temp src: Oral (11/03 1300) BP: 122/54 mmHg (11/03 1300) Pulse Rate: 52  (11/03 1300) Intake/Output from previous day:    Labs:  Basename 03/24/12 1222  WBC 8.2  HGB 12.6  PLT 202  LABCREA --  CREATININE 1.17*   Estimated Creatinine Clearance: 44.8 ml/min (by C-G formula based on Cr of 1.17).  Microbiology: No results found for this or any previous visit (from the past 720 hour(s)).  Medical History: Past Medical History  Diagnosis Date  . Hypertension   . Depression   . Back pain, chronic   . Breast cancer 09/09/10  . Cancer of colon 05/24/2011    Medications:  Anti-infectives     Start     Dose/Rate Route Frequency Ordered Stop   03/24/12 1500   vancomycin (VANCOCIN) IVPB 1000 mg/200 mL premix        1,000 mg 200 mL/hr over 60 Minutes Intravenous  Once 03/24/12 1400 03/24/12 1559         Assessment:  62 YOF with hx of breast cancer, s/p lumpectomy and radiation, and colon cancer.  Presents to ED on 11/3 with c/o breast pain, erythema, and small white blisters on the right breast.  Concern for zoster.  Vancomcyin 1g IV given in ED  CrCl ~ 45 ml/min, WBC wnl, afebrile   Goal of Therapy:  Appropriate renal dosing, resolution of infection  Plan:   Acyclovir 530mg  IV q12h.  (10mg /kg dose x IBW 52.4kg)  Follow up renal fxn and culture results.  Gretta Arab PharmD, BCPS Pager [phone] 03/24/2012 4:19 PM

## 2012-03-24 NOTE — ED Notes (Signed)
Pt assigned 5th floor bed. Pt is on contact/airborne d/t possible shingles. Notified secretary to call bed control to change room assignment to 4th floor. 5th floor has no reverse isolation rooms available

## 2012-03-24 NOTE — ED Notes (Signed)
3w called x2 for report

## 2012-03-24 NOTE — ED Notes (Addendum)
Reports series of events with right breast. Initially notice blisters between breast 2 weeks ago brought it to the attention to MD for a routine office visit. Given cream rx. Then notice 2 days ago, right breast is red progressively getting worse oever the weekend intense angry red, hot, swollen (so much that patient has to hold right breast when walking), painful rated 10/10.  Noted for a week surgical breast incision started bothering her worse than usually. Denies fever, nausea or vomiting. Right shoulder pain.  Stage 1 breast Ca lumpectomy, radiation, coloscopy + CA colon all this in  May 2012.

## 2012-03-24 NOTE — Progress Notes (Addendum)
ANTIBIOTIC CONSULT NOTE - FOLLOW-UP  Pharmacy Consult for:  Vancomycin Indication:  Cellulitis of right breast  No Known Allergies  Patient Measurements: Height: 5\' 3"  (160 cm) Weight: 153 lb (69.4 kg) IBW/kg (Calculated) : 52.4    Vital Signs: Temp: 97.7 F (36.5 C) (11/03 1300) Temp src: Oral (11/03 1300) BP: 122/54 mmHg (11/03 1300) Pulse Rate: 52  (11/03 1300)   Labs:  Basename 03/24/12 1222  WBC 8.2  HGB 12.6  PLT 202  LABCREA --  CREATININE 1.17*   Estimated Creatinine Clearance: 44.8 ml/min (by C-G formula based on Cr of 1.17).  Microbiology: Blood cultures pending  Anti-infectives     Start     Dose/Rate Route Frequency Ordered Stop   03/24/12 1800   acyclovir (ZOVIRAX) 530 mg in dextrose 5 % 100 mL IVPB        530 mg 110.6 mL/hr over 60 Minutes Intravenous Every 12 hours 03/24/12 1618     03/24/12 1500   vancomycin (VANCOCIN) IVPB 1000 mg/200 mL premix        1,000 mg 200 mL/hr over 60 Minutes Intravenous  Once 03/24/12 1400 03/24/12 1559          Assessment: -Asked to assist with Vancomycin therapy for this 66 year-old female with right breast pain, erythema, and small white blisters.   -Also receiving IV Acyclovir due to concern for herpes zoster (shingles). -Vancomycin 1 gram was given in the ED at 14:29 today.  Goals of Therapy:  -Vancomycin trough levels 10-15 mcg/ml -Eradication of infection  Plan:  -Vancomycin 1250 mg IV every 24 hours -Trough levels as needed to guide dosing  [employer] R.Ph. 03/24/2012 6:43 PM     ,

## 2012-03-24 NOTE — ED Provider Notes (Signed)
Medical screening examination/treatment/procedure(s) were conducted as a shared visit with non-physician practitioner(s) and myself.  I personally evaluated the patient during the encounter   Mervin Kung, MD 03/24/12 [phone]

## 2012-03-25 ENCOUNTER — Encounter (HOSPITAL_COMMUNITY): Payer: Self-pay

## 2012-03-25 DIAGNOSIS — D649 Anemia, unspecified: Secondary | ICD-10-CM

## 2012-03-25 DIAGNOSIS — C50919 Malignant neoplasm of unspecified site of unspecified female breast: Secondary | ICD-10-CM

## 2012-03-25 LAB — COMPREHENSIVE METABOLIC PANEL
BUN: 15 mg/dL (ref 6–23)
Calcium: 10.3 mg/dL (ref 8.4–10.5)
Creatinine, Ser: 1.25 mg/dL — ABNORMAL HIGH (ref 0.50–1.10)
GFR calc Af Amer: 51 mL/min — ABNORMAL LOW (ref >90)
GFR calc non Af Amer: 44 mL/min — ABNORMAL LOW (ref >90)
Glucose, Bld: 112 mg/dL — ABNORMAL HIGH (ref 70–99)
Sodium: 140 mEq/L (ref 135–145)
Total Protein: 7.2 g/dL (ref 6.0–8.3)

## 2012-03-25 LAB — CBC
HCT: 39.3 % (ref 36.0–46.0)
Hemoglobin: 13 g/dL (ref 12.0–15.0)
RBC: 4.49 MIL/uL (ref 3.87–5.11)
WBC: 8.6 10*3/uL (ref 4.0–10.5)

## 2012-03-25 MED ORDER — HYDROMORPHONE HCL PF 2 MG/ML IJ SOLN
2.0000 mg | INTRAMUSCULAR | Status: DC | PRN
  Administered 2012-03-25 – 2012-03-27 (×7): 2 mg via INTRAVENOUS
  Administered 2012-03-28 (×2): 4 mg via INTRAVENOUS
  Administered 2012-03-28: 2 mg via INTRAVENOUS
  Administered 2012-03-28 – 2012-03-29 (×2): 4 mg via INTRAVENOUS
  Administered 2012-03-29 (×3): 2 mg via INTRAVENOUS
  Filled 2012-03-25 (×2): qty 1
  Filled 2012-03-25: qty 2
  Filled 2012-03-25 (×2): qty 1
  Filled 2012-03-25 (×2): qty 2
  Filled 2012-03-25 (×5): qty 1
  Filled 2012-03-25: qty 2
  Filled 2012-03-25 (×2): qty 1

## 2012-03-25 NOTE — Progress Notes (Signed)
Pt.'s Rt. Breast completely reddened area marked. The redness has extended past the mark under her Rt. Arm. No blisters noted, it remains very painful for pt. Anything touching that area is extremely Painful 10/10. Surgeon was to suppose to have a consult with the pt per Dr. Charlies Silvers. No surgeon came today.Pt. C/o of a Headache tonight given 2 Tylenol, the pain decreased to 3. She has been ambulating to the bathroom, tolerates well, had a BM today. Anxious to find out what is causing this pain in her breast.

## 2012-03-25 NOTE — Progress Notes (Addendum)
[facility] PROGRESS NOTE  [name] [id number] DOB: 1945/10/02 DOA: 03/24/2012 PCP: Kristine Garbe, MD  Brief narrative: 66 year old female with past medical history of breast cancer status post lumpectomy and radiation therapy to the right breast (diagnosed 08/2010), past medical history of right side colon cancer, depression presented with  right breast tenderness and erythema 1 day prior to this admission.  Assessment/Plan:  Principal Problem:  *Breast erythema and tenderness  Possible cellulitis versus Shingles versus Paget's disease of breast  Continue vancomycin and acyclovir  Appreciate surgery consult and recommendation  Order placed for ultrasound of right breast  Pain regimen: OxyContin 60 mg twice daily scheduled, Dilaudid 2-4 mg every 2 hours as needed when necessary IV for severe pain  Active Problems: History of breast cancer  Dr. Humphrey Rolls of oncology was notified of patient's admission  Continue anastrozole 1 mg at bedtime  HTN (hypertension)  Continue nebivolol 10 mg daily and Lasix 40 mg twice daily   Anxiety and depression  Continue Wellbutrin 300 mg daily, fluvoxamine 100 mg at bedtime and 50 mg daily   Code Status: full code Family Communication: not at bedside Disposition Plan: home when stable  Leisa Lenz, MD  [facility] Pager [phone]  If 7PM-7AM, please contact night-coverage [email or web address] Password [email or web address] 03/25/2012, 2:55 PM   LOS: 1 day   Consultants:  Oncology (Dr. Humphrey Rolls) informed of patient's admission  Surgery  Procedures:  None   Antibiotics:  Vanco 03/24/2012 -->  Acyclovir 03/24/2012 -->  HPI/Subjective: No acute events overnight. Patient still with significant tenderness of right breast.  Objective: Filed Vitals:   03/24/12 1919 03/24/12 2049 03/25/12 0556 03/25/12 1300  BP: 128/56 123/51 144/64 109/64  Pulse:  55 65 56  Temp:  98.6 F (37 C) 98 F (36.7 C) 98.1 F (36.7 C)  TempSrc:  Oral Oral     Resp:  16 16 12   Height:  5\' 3"  (1.6 m)    Weight:  69.582 kg (153 lb 6.4 oz)    SpO2:  98% 95% 95%    Intake/Output Summary (Last 24 hours) at 03/25/12 1455 Last data filed at 03/25/12 1300  Gross per 24 hour  Intake   1200 ml  Output    500 ml  Net    700 ml    Exam:   General:  Pt is alert, follows commands appropriately, not in acute distress  Cardiovascular: Regular rate and rhythm, S1/S2, no murmurs, no rubs, no gallops  Chest: significant right breast erythema and tenderness to palpation  Respiratory: Clear to auscultation bilaterally, no wheezing, no crackles, no rhonchi  Abdomen: Soft, non tender, non distended, bowel sounds present, no guarding  Extremities: No edema, pulses DP and PT palpable bilaterally  Neuro: Grossly nonfocal  Data Reviewed: Basic Metabolic Panel:  Lab [id number] 03/24/12 1222  NA 140 134*  K 3.7 3.4*  CL 97 93*  CO2 35* 28  GLUCOSE 112* 145*  BUN 15 18  CREATININE 1.25* 1.17*  CALCIUM 10.3 10.2   Liver Function Tests:  Lab 03/25/12 0409 03/24/12 1222  AST 21 19  ALT 11 10  ALKPHOS 115 118*  BILITOT 0.5 0.7  PROT 7.2 7.1  ALBUMIN 3.7 3.8   CBC:  Lab 03/25/12 0409 03/24/12 1222  WBC 8.6 8.2  HGB 13.0 12.6  HCT 39.3 37.5  MCV 87.5 86.6  PLT 235 202    Studies: Dg Chest 2 View 03/24/2012   IMPRESSION: No acute cardiopulmonary findings.   Original Report  Authenticated By: Marijo Sanes, M.D.     Scheduled Meds:   . acyclovir  530 mg Intravenous Q12H  . anastrozole  1 mg Oral QHS  . buPROPion  300 mg Oral Daily  . docusate sodium  200 mg Oral QHS  . fluvoxaMINE  100 mg Oral QHS  . fluvoxaMINE  50 mg Oral Daily  . furosemide  40 mg Oral BID  . heparin  5,000 Units Subcutaneous Q8H  . nebivolol  10 mg Oral Daily  . OxyCODONE  60 mg Oral Q12H  . polyethylene glycol  17 g Oral QODAY  . potassium gluconate  595 mg Oral Daily  . vancomycin  1,250 mg Intravenous Q24H

## 2012-03-26 DIAGNOSIS — C189 Malignant neoplasm of colon, unspecified: Secondary | ICD-10-CM

## 2012-03-26 LAB — BASIC METABOLIC PANEL
BUN: 16 mg/dL (ref 6–23)
Chloride: 101 mEq/L (ref 96–112)
Glucose, Bld: 101 mg/dL — ABNORMAL HIGH (ref 70–99)
Potassium: 2.7 mEq/L — CL (ref 3.5–5.1)
Sodium: 139 mEq/L (ref 135–145)

## 2012-03-26 MED ORDER — POTASSIUM CHLORIDE 20 MEQ/15ML (10%) PO LIQD
40.0000 meq | Freq: Every day | ORAL | Status: DC
Start: 2012-03-26 — End: 2012-03-26
  Administered 2012-03-26: 40 meq via ORAL
  Filled 2012-03-26: qty 30

## 2012-03-26 MED ORDER — ONDANSETRON HCL 4 MG/2ML IJ SOLN
4.0000 mg | Freq: Four times a day (QID) | INTRAMUSCULAR | Status: DC | PRN
  Administered 2012-03-26 – 2012-03-27 (×3): 4 mg via INTRAVENOUS
  Filled 2012-03-26 (×3): qty 2

## 2012-03-26 MED ORDER — OXYCODONE HCL ER 20 MG PO T12A
60.0000 mg | EXTENDED_RELEASE_TABLET | Freq: Three times a day (TID) | ORAL | Status: DC
  Administered 2012-03-26 – 2012-03-30 (×11): 60 mg via ORAL
  Filled 2012-03-26 (×11): qty 3

## 2012-03-26 MED ORDER — HYDROCORTISONE 2.5 % RE CREA
TOPICAL_CREAM | Freq: Three times a day (TID) | RECTAL | Status: DC | PRN
  Administered 2012-03-26: 1 via RECTAL
  Filled 2012-03-26: qty 28.35

## 2012-03-26 MED ORDER — POTASSIUM CHLORIDE CRYS ER 20 MEQ PO TBCR
20.0000 meq | EXTENDED_RELEASE_TABLET | Freq: Once | ORAL | Status: AC
  Administered 2012-03-26: 20 meq via ORAL
  Filled 2012-03-26: qty 1

## 2012-03-26 MED ORDER — POTASSIUM CHLORIDE CRYS ER 20 MEQ PO TBCR
40.0000 meq | EXTENDED_RELEASE_TABLET | Freq: Once | ORAL | Status: AC
  Administered 2012-03-26: 40 meq via ORAL
  Filled 2012-03-26: qty 2

## 2012-03-26 MED ORDER — POTASSIUM CHLORIDE 20 MEQ/15ML (10%) PO LIQD
40.0000 meq | Freq: Every day | ORAL | Status: DC
  Filled 2012-03-26: qty 30

## 2012-03-26 NOTE — Progress Notes (Signed)
Called MD to confirm awareness that breast US unable to be perform inpt.  MD already aware. Orders received to keep pt on home regimen of Oxycontin and changed potassium syrup to PO as the syrup made pt nauseous. Pt informed of above.

## 2012-03-26 NOTE — Progress Notes (Signed)
[facility] PROGRESS NOTE  [name] [id number] DOB: Oct 09, 1945 DOA: 03/24/2012 PCP: Kristine Garbe, MD  Brief narrative: 66 year old female with past medical history of colon cancer, breast cancer status post lumpectomy and radiation therapy to the right breast (diagnosed 08/2010), past medical history of right side colon cancer, depression presented with right breast tenderness and erythema 1 day prior to this admission. Patient was started on vancomycin for possible cellulitis, acyclovir for possible shingles and we are waiting for surgery to evaluate the patient since this can also possible represent recurrence of breast cancer.  Assessment/Plan:   Principal Problem:  *Breast erythema and tenderness  Possible cellulitis versus Shingles versus Paget's disease of breast considering patient's history of breast cancer Continue vancomycin and acyclovir  Appreciate surgery consult and recommendation; consult requested 03/25/2012 and recommendation was to order ultrasound of the breast but no official consult available yet Order placed for ultrasound of right breast per surgery recommendations however this is not done in [facility] Pain regimen: OxyContin 60 mg twice daily scheduled, Dilaudid 2-4 mg every 2 hours as needed when necessary IV for severe pain; of note - patient reported morphine IV does not help and dilaudid is only slightly improving the pain  Active Problems:  History of breast cancer  Dr. Humphrey Rolls of oncology was notified of patient's admission and has agreed with surgical evaluation Continue anastrozole 1 mg at bedtime HTN (hypertension)  Continue nebivolol 10 mg daily and Lasix 40 mg twice daily Acute kidney injury  This is likely secondary to lasix  Kidney function is now stable at 1.24 while on lasix 40 mg twice daily; I have informed the patient that lasix is probably contributing to her kidney injury but patient was somewhat resistant to have lasix  discontinued as she reports it helps her with leg swelling. She does not have signigicant edema and perhaps this is because she is taking lasix  Will check BMP in am and if kidney function trends up consider decreasing the frequency and/or dosage of lasix Anxiety and depression  Continue Wellbutrin 300 mg daily, fluvoxamine 100 mg at bedtime and 50 mg daily  Code Status: full code  Family Communication: not at bedside  Disposition Plan: home when stable   Leisa Lenz, MD  [facility]  Pager [phone]   If 7PM-7AM, please contact night-coverage [email or web address] Password [email or web address] 03/26/2012, 2:23 PM   LOS: 2 days   HPI/Subjective: Says she feels slightly better but pain is still there.  Objective: Filed Vitals:   03/25/12 0556 03/25/12 1300 03/25/12 2048 03/26/12 0500  BP: 144/64 109/64 113/54 134/59  Pulse: 65 56 65 56  Temp: 98 F (36.7 C) 98.1 F (36.7 C) 98 F (36.7 C) 97.9 F (36.6 C)  TempSrc: Oral  Oral Oral  Resp: 16 12 15 18   Height:      Weight:      SpO2: 95% 95% 97% 91%    Intake/Output Summary (Last 24 hours) at 03/26/12 1423 Last data filed at 03/26/12 1215  Gross per 24 hour  Intake  730.6 ml  Output   1401 ml  Net -670.4 ml    Exam:   General:  Pt is alert, follows commands appropriately, not in acute distress  Cardiovascular: Regular rate and rhythm, S1/S2, no murmurs, no rubs, no gallops  Breast: right breast erythema only mildly better than yesterday, still very tender to palpation; no blisters visible  Respiratory: Clear to auscultation bilaterally, no wheezing, no crackles, no rhonchi  Abdomen:  Soft, non tender, non distended, bowel sounds present, no guarding  Extremities: No edema, pulses DP and PT palpable bilaterally  Neuro: Grossly nonfocal  Data Reviewed: Basic Metabolic Panel:  Lab [id number] 03/25/12 0409 03/24/12 1222  NA 139 140 134*  K 2.7* 3.7 3.4*  CL 101 97 93*  CO2 31 35* 28  GLUCOSE 101* 112* 145*  BUN 16 15 18     CREATININE 1.24* 1.25* 1.17*  CALCIUM 9.7 10.3 10.2   Liver Function Tests:  Lab 03/25/12 0409 03/24/12 1222  AST 21 19  ALT 11 10  ALKPHOS 115 118*  BILITOT 0.5 0.7  PROT 7.2 7.1  ALBUMIN 3.7 3.8   CBC:  Lab 03/25/12 0409 03/24/12 1222  WBC 8.6 8.2  HGB 13.0 12.6  HCT 39.3 37.5  MCV 87.5 86.6  PLT 235 202   Studies: No results found.  Scheduled Meds:   . acyclovir  530 mg Intravenous Q12H  . anastrozole  1 mg Oral QHS  . buPROPion  300 mg Oral Daily  . docusate sodium  200 mg Oral QHS  . fluvoxaMINE  100 mg Oral QHS  . fluvoxaMINE  50 mg Oral Daily  . furosemide  40 mg Oral BID  . heparin  5,000 Units Subcutaneous Q8H  . nebivolol  10 mg Oral Daily  . OxyCODONE  60 mg Oral Q8H  . polyethylene glycol  17 g Oral QODAY  . potassium gluconate  595 mg Oral Daily  . vancomycin  1,250 mg Intravenous Q24H

## 2012-03-26 NOTE — Progress Notes (Signed)
CRITICAL VALUE ALERT  Critical value received:  POTASSIUM 2.7  Date of notification:  03/26/12  Time of notification:  0430  Critical value read back:YES  Nurse who received alert:  Lottie Dawson  MD notified (1st page):  [email or web address],NP  Time of first page:  0450  MD notified (2nd page):  Time of second page:  Responding MD:  Lamar Blinks, NP  Time MD responded:  0500

## 2012-03-26 NOTE — Progress Notes (Signed)
Pt c/o itching and discomfort r/t hemorrhoids. Asked MD for cream, order given.

## 2012-03-27 ENCOUNTER — Other Ambulatory Visit: Payer: PRIVATE HEALTH INSURANCE | Admitting: Lab

## 2012-03-27 ENCOUNTER — Ambulatory Visit: Payer: PRIVATE HEALTH INSURANCE | Admitting: Oncology

## 2012-03-27 DIAGNOSIS — M25511 Pain in right shoulder: Secondary | ICD-10-CM | POA: Diagnosis present

## 2012-03-27 DIAGNOSIS — N61 Mastitis without abscess: Secondary | ICD-10-CM

## 2012-03-27 DIAGNOSIS — L538 Other specified erythematous conditions: Secondary | ICD-10-CM

## 2012-03-27 DIAGNOSIS — Z853 Personal history of malignant neoplasm of breast: Secondary | ICD-10-CM

## 2012-03-27 LAB — CBC
HCT: 33.3 % — ABNORMAL LOW (ref 36.0–46.0)
Hemoglobin: 11.1 g/dL — ABNORMAL LOW (ref 12.0–15.0)
MCH: 29.4 pg (ref 26.0–34.0)
MCHC: 33.3 g/dL (ref 30.0–36.0)
MCV: 88.3 fL (ref 78.0–100.0)
Platelets: 259 10*3/uL (ref 150–400)
RBC: 3.77 MIL/uL — ABNORMAL LOW (ref 3.87–5.11)
RDW: 13.6 % (ref 11.5–15.5)
WBC: 5.1 10*3/uL (ref 4.0–10.5)

## 2012-03-27 LAB — BASIC METABOLIC PANEL
BUN: 17 mg/dL (ref 6–23)
CO2: 29 mEq/L (ref 19–32)
Calcium: 9.8 mg/dL (ref 8.4–10.5)
Chloride: 102 mEq/L (ref 96–112)
Creatinine, Ser: 1.45 mg/dL — ABNORMAL HIGH (ref 0.50–1.10)
GFR calc Af Amer: 43 mL/min — ABNORMAL LOW (ref >90)
GFR calc non Af Amer: 37 mL/min — ABNORMAL LOW (ref >90)
Glucose, Bld: 82 mg/dL (ref 70–99)
Potassium: 4 mEq/L (ref 3.5–5.1)
Sodium: 140 mEq/L (ref 135–145)

## 2012-03-27 MED ORDER — SODIUM CHLORIDE 0.9 % IV SOLN
INTRAVENOUS | Status: DC
  Administered 2012-03-27 – 2012-03-30 (×4): via INTRAVENOUS

## 2012-03-27 MED ORDER — PIPERACILLIN-TAZOBACTAM 3.375 G IVPB
3.3750 g | Freq: Three times a day (TID) | INTRAVENOUS | Status: DC
  Administered 2012-03-27 – 2012-03-28 (×4): 3.375 g via INTRAVENOUS
  Filled 2012-03-27 (×5): qty 50

## 2012-03-27 MED ORDER — PROMETHAZINE HCL 25 MG/ML IJ SOLN
12.5000 mg | INTRAMUSCULAR | Status: DC | PRN
  Administered 2012-03-27: 12.5 mg via INTRAVENOUS
  Filled 2012-03-27: qty 1

## 2012-03-27 NOTE — Consult Note (Signed)
Reason for Consult:Right shoulder pain Referring Physician: Dr. Rockne Menghini  HPI: [name] is an 66 y.o. female who was admitted with a probable mastitis undergoing clinical workup and treatment with IV antibiotics currently. She is well known to [name] having undergone bilateral TSA for endstage OA and never really got the ultimate pain relief in her right shoulder the way her left shoulder did. Prior to this episode of right breast cellulitis her shoulder has remained chronically painful and her symptoms have not changed in relationship to her admitting problem. With her history she requested [name] to be called  Past Medical History  Diagnosis Date  . Hypertension   . Depression   . Back pain, chronic   . Breast cancer 09/09/10  . Cancer of colon 05/24/2011    Past Surgical History  Procedure Date  . Back surgery   . Shoulder surgery   . Tumor removal Tumor removed R breast  . Lumbar disc surgery   . Partial hysterectomy   . Colonoscopy 05/03/2011    Procedure: COLONOSCOPY;  Surgeon: Jeryl Columbia, MD;  Location[facility] ENDOSCOPY;  Service: Endoscopy;  Laterality: N/A;  . Colon surgery 04/2011  . Breast lumpectomy 10/10/2010    Rt Breast  . Joint replacement     R&L total shoulder replacements    Family History  Problem Relation Age of Onset  . Diabetes Mother   . Hypertension Mother   . Cancer Mother   . Sudden death Mother   . Anesthesia problems Neg Hx   . Hypotension Neg Hx   . Malignant hyperthermia Neg Hx   . Pseudochol deficiency Neg Hx     Social History:  reports that she has quit smoking. Her smoking use included Cigarettes. She smoked 1 pack per day. She has never used smokeless tobacco. She reports that she does not drink alcohol or use illicit drugs.  Allergies: No Known Allergies  Medications: I have reviewed the patient's current medications.  Results for orders placed during the hospital encounter of 03/24/12 (from the past 48 hour(s))  BASIC METABOLIC PANEL      Status: Abnormal   Collection Time   03/26/12  4:30 AM      Component Value Range Comment   Sodium 139  135 - 145 mEq/L    Potassium 2.7 (*) 3.5 - 5.1 mEq/L    Chloride 101  96 - 112 mEq/L    CO2 31  19 - 32 mEq/L    Glucose, Bld 101 (*) 70 - 99 mg/dL    BUN 16  6 - 23 mg/dL    Creatinine, Ser 1.24 (*) 0.50 - 1.10 mg/dL    Calcium 9.7  8.4 - 10.5 mg/dL    GFR calc non Af Amer 45 (*) >90 mL/min    GFR calc Af Amer 52 (*) >90 mL/min   BASIC METABOLIC PANEL     Status: Abnormal   Collection Time   03/27/12  6:25 AM      Component Value Range Comment   Sodium 140  135 - 145 mEq/L    Potassium 4.0  3.5 - 5.1 mEq/L    Chloride 102  96 - 112 mEq/L    CO2 29  19 - 32 mEq/L    Glucose, Bld 82  70 - 99 mg/dL    BUN 17  6 - 23 mg/dL    Creatinine, Ser 1.45 (*) 0.50 - 1.10 mg/dL    Calcium 9.8  8.4 - 10.5 mg/dL  GFR calc non Af Amer 37 (*) >90 mL/min    GFR calc Af Amer 43 (*) >90 mL/min   CBC     Status: Abnormal   Collection Time   03/27/12  6:25 AM      Component Value Range Comment   WBC 5.1  4.0 - 10.5 K/uL    RBC 3.77 (*) 3.87 - 5.11 MIL/uL    Hemoglobin 11.1 (*) 12.0 - 15.0 g/dL    HCT 33.3 (*) 36.0 - 46.0 %    MCV 88.3  78.0 - 100.0 fL    MCH 29.4  26.0 - 34.0 pg    MCHC 33.3  30.0 - 36.0 g/dL    RDW 13.6  11.5 - 15.5 %    Platelets 259  150 - 400 K/uL     No results found.  ROS: denies redness swelling or anything besides pain with ROM to r shoulder  Physical Exam: General appearance: her right shoulder shows a well healed deltopectoral incision. she has excellent AROM and PROM with her RTC intact. there is noeryhtema or effusion about the right shoulder joint itself  Chest xray captured a single image on AP which shows the TSA without obvious signs of wear but this is not a dedicated shoulder film Vitals Temp:  [97.8 F (36.6 C)-99 F (37.2 C)] 97.8 F (36.6 C) (11/06 0538) Pulse Rate:  [54-98] 61  (11/06 0538) Resp:  [14-16] 16  (11/06 0538) BP:  (111-136)/(51-61) 136/61 mmHg (11/06 0538) SpO2:  [92 %-97 %] 92 % (11/06 0538) Body mass index is 27.17 kg/(m^2).  Assessment/Plan: Impression: Right TSA Treatment: she continues to complain of R shoulder pain despite excellent active mobility following her right TSA. She does have a history of chronic pain which may be a portion of it. There are no active signs of infection within the right shoulder on clinical exam. I have discussed with [name] to follow up with [name] on an elective basis for more definitive workup of her shoulder when she has recovered from her current condition.  [facility] for Dr. Justice Britain 03/27/2012, 12:25 PM

## 2012-03-27 NOTE — Progress Notes (Signed)
Patient due for breast ultrasound on 03/28/2012 at the [place].  Her appointment time is for 1pm.  Spoke with Roe Coombs, [facility] regarding sending patient for an outpatient procedure while an inpatient.  Clarise Cruz spoke with Bubba Hales, [facility], Fara Chute, Radiology Manager and Lambert Keto, Director of Nursing for [facility] and they all agreed that patient could be transported via [facility] for test and then return to the hospital. Patient will need to sign a Leave of Absence form.  I notified [place] dispatcher and he stated that we will need to call back tomorrow morning to arrange for transportation [phone]).  If patient cannot make appointment or will be late, we need to notify Juleen Starr with the [place] [phone]).  Patient's current nurse, Cindi Carbon, notified of above.  Modena Jansky, PA with [facility] notified that test has been arranged.  Zandra Abts [facility]  03/27/2012  4:20 PM

## 2012-03-27 NOTE — Progress Notes (Signed)
[facility] PROGRESS NOTE  [name] [id number] DOB: 02-Nov-1945 DOA: 03/24/2012 PCP: Kristine Garbe, MD  Brief narrative: [name] is a 66 year old female with past medical history of colon cancer, breast cancer status post lumpectomy and radiation therapy to the right breast (diagnosed 08/2010),  right sided colon cancer and depression who presented to the hospital on 03/24/12 with right breast tenderness and erythema 1 day prior to this admission. Patient was started on vancomycin for possible cellulitis, acyclovir for possible shingles.  There has not been any significant improvement in erythema, so will add gram negative coverage.  Awaiting oncological and surgical consultations.   Assessment/Plan: Principal Problem:  *Breast erythema most likely secondary to mastitis / cellulitis  Admitted and put on empiric Vancomycin and Acyclovir to cover cellulitis and shingles.  No appreciable improvement from inked margins of erythema, so Zosyn added 03/27/12.  Clinical appearance most consistent with mastitis.  Seen by surgery 03/27/12: Recommended ultrasound (which cannot be done in house, but surgeon attempting to obtain-- to determine if patient needs drainage or biopsy).  Dr. Humphrey Rolls consulted, to see patient today.  Continue pain control measures. Active Problems:  HTN (hypertension)  Controlled on Nebivolol and Lasix. Hold lasix given rising creatinine.  Anxiety and depression  Continue Wellbutrin and fluvoxamine.  H/O Breast cancer status post lumpectomy, radiation therapy  Need to rule out inflammatory breast cancer.  Dr. Humphrey Rolls and surgery to see today.  Continue Arimidex.  AKI (acute kidney injury)  Creatinine rising.  Hydrate.  May be from Vancomycin.  Right shoulder pain  Patient requests orthopedic doctor be called.  Notified and spoke with Jenetta Loges who kindly saw the patient 03/27/12.  Outpatient work up recommended.  Code Status: Full. Family  Communication: Niece updated at bedside. Disposition Plan: Home when stable.   Medical Consultants:  Dr. [name], Oncology.  Surgery  Jenetta Loges, PA-C, Orthopedic surgery (per patient request)  Other Consultants:  Pharmacy  Anti-infectives:  Zosyn 03/27/12--->  Vancomycin 03/24/12--->  Acyclovir 03/24/12--->  HPI/Subjective: [name] is still having significant pain and discomfort in her left breast.  The erythema has spread outside the inked margins in some areas, and has regressed in others.  No nipple discharge.  No fever or shaking chills.  Reports weight gain over time.  Objective: Filed Vitals:   03/26/12 1426 03/26/12 2234 03/27/12 0538 03/27/12 1400  BP: 111/52 113/51 136/61 123/60  Pulse: 54 98 61 116  Temp: 98 F (36.7 C) 99 F (37.2 C) 97.8 F (36.6 C) 98.2 F (36.8 C)  TempSrc: Oral Oral Oral Oral  Resp: 14 16 16 20   Height:      Weight:      SpO2: 97% 94% 92% 99%    Intake/Output Summary (Last 24 hours) at 03/27/12 1420 Last data filed at 03/27/12 1401  Gross per 24 hour  Intake  950.6 ml  Output    500 ml  Net  450.6 ml    Exam: Gen:  NAD Cardiovascular:  RRR, No M/R/G Respiratory:  Lungs CTAB Breast: Left breast with marked erythema, no nipple discharge.  No left axillary lymphadenopathy. Gastrointestinal:  Abdomen soft, NT/ND, + BS Extremities:  No C/E/C  Data Reviewed: Basic Metabolic Panel:  Lab [id number] 0625 03/26/12 0430 03/25/12 0409 03/24/12 1222  NA 140 139 140 134*  K 4.0 2.7* -- --  CL 102 101 97 93*  CO2 29 31 35* 28  GLUCOSE 82 101* 112* 145*  BUN 17 16 15  18  CREATININE 1.45* 1.24* 1.25* 1.17*  CALCIUM 9.8 9.7 10.3 10.2  MG -- -- -- --  PHOS -- -- -- --   GFR Estimated Creatinine Clearance: 36.2 ml/min (by C-G formula based on Cr of 1.45). Liver Function Tests:  Lab 03/25/12 0409 03/24/12 1222  AST 21 19  ALT 11 10  ALKPHOS 115 118*  BILITOT 0.5 0.7  PROT 7.2 7.1  ALBUMIN 3.7 3.8     CBC:  Lab 03/27/12 0625 03/25/12 0409 03/24/12 1222  WBC 5.1 8.6 8.2  NEUTROABS -- -- 6.0  HGB 11.1* 13.0 12.6  HCT 33.3* 39.3 37.5  MCV 88.3 87.5 86.6  PLT 259 235 202   BNP (last 3 results)  Basename 05/02/11 1145  PROBNP 3468.0*   Microbiology Recent Results (from the past 240 hour(s))  CULTURE, BLOOD (ROUTINE X 2)     Status: Normal (Preliminary result)   Collection Time   03/24/12 12:22 PM      Component Value Range Status Comment   Specimen Description BLOOD LEFT AC   Final    Special Requests BOTTLES DRAWN AEROBIC AND ANAEROBIC 3 CC EACH   Final    Culture  Setup Time 03/24/2012 23:59   Final    Culture     Final    Value:        BLOOD CULTURE RECEIVED NO GROWTH TO DATE CULTURE WILL BE HELD FOR 5 DAYS BEFORE ISSUING A FINAL NEGATIVE REPORT   Report Status PENDING   Incomplete   CULTURE, BLOOD (ROUTINE X 2)     Status: Normal (Preliminary result)   Collection Time   03/24/12 12:50 PM      Component Value Range Status Comment   Specimen Description BLOOD LEFT FA   Final    Special Requests BOTTLES DRAWN AEROBIC AND ANAEROBIC 4 CC EACH   Final    Culture  Setup Time 03/24/2012 23:59   Final    Culture     Final    Value:        BLOOD CULTURE RECEIVED NO GROWTH TO DATE CULTURE WILL BE HELD FOR 5 DAYS BEFORE ISSUING A FINAL NEGATIVE REPORT   Report Status PENDING   Incomplete      Procedures and Diagnostic Studies: Dg Chest 2 View  03/24/2012  *RADIOLOGY REPORT*  Clinical Data: Chest pain.  CHEST - 2 VIEW  Comparison: 05/02/2011.  Findings: The cardiac silhouette, mediastinal and hilar contours are within normal limits and stable.  The lungs are clear.  Remote rib fractures are again demonstrated.  Bilateral shoulder prostheses are noted.  IMPRESSION: No acute cardiopulmonary findings.   Original Report Authenticated By: Marijo Sanes, M.D.     Scheduled Meds:    . acyclovir  530 mg Intravenous Q12H  . anastrozole  1 mg Oral QHS  . buPROPion  300 mg Oral Daily   . docusate sodium  200 mg Oral QHS  . fluvoxaMINE  100 mg Oral QHS  . fluvoxaMINE  50 mg Oral Daily  . furosemide  40 mg Oral BID  . heparin  5,000 Units Subcutaneous Q8H  . nebivolol  10 mg Oral Daily  . OxyCODONE  60 mg Oral Q8H  . piperacillin-tazobactam (ZOSYN)  IV  3.375 g Intravenous Q8H  . polyethylene glycol  17 g Oral QODAY  . potassium gluconate  595 mg Oral Daily  . vancomycin  1,250 mg Intravenous Q24H   Continuous Infusions:   Time spent: 40 minutes.   LOS: 3 days  [facility] Pager [phone].  If 8PM-8AM, please contact night-coverage at [email or web address], password [facility] 03/27/2012, 2:20 PM

## 2012-03-27 NOTE — Consult Note (Signed)
General Surgery [facility]  Patient seen and examined.  Significant erythema covering entire right breast.  Slight induration.  No fluctuence or evidence of abscess.  Very tender.  Will obtain breast ultrasound at [facility].  May need punch biopsy to rule out inflammatory breast cancer.  Would continue IV abx therapy for now.  I will notify Dr. Brantley Stage who performed the patient's breast surgery last year.  Earnstine Regal, MD, [facility] Office: [phone]

## 2012-03-27 NOTE — Consult Note (Signed)
Reason for Consult:Breast cellulitis Referring Physician: Devine  [name] is an 66 y.o. female.  HPI: The patient is a 66 y/o woman who was doing well till last Saturday 03/23/12, she has some redness appear mid breast and on both her right and left breast.  The following day the changes on the left got better, but the redness and swelling on the right got worse and spread. She had increased tenderness, erythema and swelling.and presented to the ER on 03/26/12.  She was started on acyclovir and antibiotics for cellulitis and possible herpes infection.  We were ask to see in consult by Dr. Charlies Silvers.  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  Past Medical History  Diagnosis Date  . Hypertension   . Depression/anxiety/Panic attacks   . Back pain, chronic on narcotics Constipation treated with OTC laxative.   . Breast cancer, partial mastectomy 09/2010, Dr. Brantley Stage, followed by chemotherapy, and radiation Rx. 09/09/10  . Cancer of colon s/p right hemicolectomy 05/05/11, Dr. Gershon Crane 05/24/2011    Past Surgical History  Procedure Date  . Back surgery   . Shoulder surgery   . Tumor removal Tumor removed R breast  . Lumbar disc surgery   . Partial hysterectomy   . Colonoscopy 05/03/2011    Procedure: COLONOSCOPY;  Surgeon: Jeryl Columbia, MD;  Location: [facility] ENDOSCOPY;  Service: Endoscopy;  Laterality: N/A;  . Colon surgery 04/2011  . Breast lumpectomy 10/10/2010    Rt Breast  . Joint replacement     R&L total shoulder replacements    Family History  Problem Relation Age of Onset  . Diabetes Mother   . Hypertension Mother   . Cancer Mother   . Sudden death Mother   . Anesthesia problems Neg Hx   . Hypotension Neg Hx   . Malignant hyperthermia Neg Hx   . Pseudochol deficiency Neg Hx     Social History:  reports that she has quit smoking. Her smoking use included Cigarettes. She smoked 1 pack per day.  She has never used smokeless tobacco. She reports that she does not drink alcohol or use illicit drugs.  Allergies: No Known Allergies  Medications:  Prior to Admission:  Prescriptions prior to admission  Medication Sig Dispense Refill  . ALPRAZolam (XANAX) 0.5 MG tablet Take 1 tablet (0.5 mg total) by mouth 2 (two) times daily as needed for anxiety. For anxiety  1 tablet  0  . anastrozole (ARIMIDEX) 1 MG tablet Take 1 mg by mouth at bedtime.        Marland Kitchen buPROPion (WELLBUTRIN XL) 300 MG 24 hr tablet Take 300 mg by mouth daily.        Marland Kitchen docusate sodium (COLACE) 100 MG capsule Take 200 mg by mouth at bedtime.      . fluvoxaMINE (LUVOX) 50 MG tablet Take 50-100 mg by mouth at bedtime. 1 tab in the am, 2 tabs in the pm       . furosemide (LASIX) 20 MG tablet Take 2 tablets (40 mg total) by mouth 2 (two) times daily. Take two tablets once daily if needed for leg swelling.  1 tablet  0  . nebivolol (BYSTOLIC) 10 MG tablet Take 10 mg by mouth daily.        . OxyCODONE HCl ER (OXYCONTIN) 60 MG T12A Take 60 mg by mouth every 8 (eight) hours. Dr approved to take an hour early  if needed      . polyethylene glycol (MIRALAX / GLYCOLAX) packet Take 17 g by mouth every other day.      . Potassium Gluconate 595 MG CAPS Take 595 mg by mouth every morning.       Scheduled:   . acyclovir  530 mg Intravenous Q12H  . anastrozole  1 mg Oral QHS  . buPROPion  300 mg Oral Daily  . docusate sodium  200 mg Oral QHS  . fluvoxaMINE  100 mg Oral QHS  . fluvoxaMINE  50 mg Oral Daily  . furosemide  40 mg Oral BID  . heparin  5,000 Units Subcutaneous Q8H  . nebivolol  10 mg Oral Daily  . OxyCODONE  60 mg Oral Q8H  . piperacillin-tazobactam (ZOSYN)  IV  3.375 g Intravenous Q8H  . polyethylene glycol  17 g Oral QODAY  . potassium gluconate  595 mg Oral Daily  . vancomycin  1,250 mg Intravenous Q24H   Continuous:  [id number], acetaminophen, ALPRAZolam, hydrocortisone, HYDROmorphone (DILAUDID) injection,  ondansetron Anti-infectives     Start     Dose/Rate Route Frequency Ordered Stop   03/27/12 1000   piperacillin-tazobactam (ZOSYN) IVPB 3.375 g        3.375 g 12.5 mL/hr over 240 Minutes Intravenous Every 8 hours 03/27/12 0939     03/25/12 1000   vancomycin (VANCOCIN) 1,250 mg in sodium chloride 0.9 % 250 mL IVPB        1,250 mg 166.7 mL/hr over 90 Minutes Intravenous Every 24 hours 03/24/12 1851     03/24/12 1800   acyclovir (ZOVIRAX) 530 mg in dextrose 5 % 100 mL IVPB        530 mg 110.6 mL/hr over 60 Minutes Intravenous Every 12 hours 03/24/12 1618     03/24/12 1500   vancomycin (VANCOCIN) IVPB 1000 mg/200 mL premix        1,000 mg 200 mL/hr over 60 Minutes Intravenous  Once 03/24/12 1400 03/24/12 1559          Results for orders placed during the hospital encounter of 03/24/12 (from the past 48 hour(s))  BASIC METABOLIC PANEL     Status: Abnormal   Collection Time   03/26/12  4:30 AM      Component Value Range Comment   Sodium 139  135 - 145 mEq/L    Potassium 2.7 (*) 3.5 - 5.1 mEq/L    Chloride 101  96 - 112 mEq/L    CO2 31  19 - 32 mEq/L    Glucose, Bld 101 (*) 70 - 99 mg/dL    BUN 16  6 - 23 mg/dL    Creatinine, Ser 1.24 (*) 0.50 - 1.10 mg/dL    Calcium 9.7  8.4 - 10.5 mg/dL    GFR calc non Af Amer 45 (*) >90 mL/min    GFR calc Af Amer 52 (*) >90 mL/min   BASIC METABOLIC PANEL     Status: Abnormal   Collection Time   03/27/12  6:25 AM      Component Value Range Comment   Sodium 140  135 - 145 mEq/L    Potassium 4.0  3.5 - 5.1 mEq/L    Chloride 102  96 - 112 mEq/L    CO2 29  19 - 32 mEq/L    Glucose, Bld 82  70 - 99 mg/dL    BUN 17  6 - 23 mg/dL    Creatinine, Ser 1.45 (*) 0.50 - 1.10 mg/dL  Calcium 9.8  8.4 - 10.5 mg/dL    GFR calc non Af Amer 37 (*) >90 mL/min    GFR calc Af Amer 43 (*) >90 mL/min   CBC     Status: Abnormal   Collection Time   03/27/12  6:25 AM      Component Value Range Comment   WBC 5.1  4.0 - 10.5 K/uL    RBC 3.77 (*) 3.87 - 5.11  MIL/uL    Hemoglobin 11.1 (*) 12.0 - 15.0 g/dL    HCT 33.3 (*) 36.0 - 46.0 %    MCV 88.3  78.0 - 100.0 fL    MCH 29.4  26.0 - 34.0 pg    MCHC 33.3  30.0 - 36.0 g/dL    RDW 13.6  11.5 - 15.5 %    Platelets 259  150 - 400 K/uL     No results found.  Review of Systems  Constitutional: Negative.   HENT: Negative.   Eyes: Negative.   Respiratory: Negative.   Cardiovascular: Negative.   Gastrointestinal: Negative for heartburn and nausea.  Genitourinary: Negative.   Musculoskeletal: Positive for back pain (chronic back pain on oxycontin).  Skin:       She had a red area mid breast on Saturday 03/23/12, it went to left and right breast.  Sunday 11/3 it was better on the left, but got worse on the right .  Neurological: Negative.   Endo/Heme/Allergies: Negative.   Psychiatric/Behavioral: The patient is nervous/anxious.        Stable last panic attack about 2 weeks ago , controlled with xanax.   Blood pressure 136/61, pulse 61, temperature 97.8 F (36.6 C), temperature source Oral, resp. rate 16, height 5\' 3"  (1.6 m), weight 153 lb 6.4 oz (69.582 kg), SpO2 92.00%. Physical Exam  Constitutional: She is oriented to person, place, and time. She appears well-developed and well-nourished. No distress.  HENT:  Head: Normocephalic and atraumatic.  Nose: Nose normal.  Eyes: Conjunctivae normal and EOM are normal. Pupils are equal, round, and reactive to light. Right eye exhibits no discharge. Left eye exhibits no discharge. No scleral icterus.  Neck: Normal range of motion. Neck supple. No JVD present. No tracheal deviation present. No thyromegaly present.  Cardiovascular: Normal rate, regular rhythm, normal heart sounds and intact distal pulses.  Exam reveals no gallop.   No murmur heard. Respiratory: Effort normal and breath sounds normal. No stridor. No respiratory distress. She has no wheezes. She has no rales. She exhibits no tenderness.  GI: Soft. Bowel sounds are normal. She exhibits no  distension and no mass. There is no tenderness. There is no rebound and no guarding.  Musculoskeletal: Normal range of motion. She exhibits no edema and no tenderness.  Lymphadenopathy:    She has no cervical adenopathy.  Neurological: She is alert and oriented to person, place, and time. She has normal reflexes.  Skin: Rash noted. She is not diaphoretic. There is erythema.       Left breast is normal, with no masses or nodules, no erythema or tenderness  Right breast is swollen and enlarged, erythematous, cellulitis; starting at the nipple and anterior portion of the breast with some cellulitis going into the lateral wall of the right chest.  No lymphadenopathy noted in either axilla or clavicular area.  Psychiatric: She has a normal mood and affect. Her behavior is normal. Judgment and thought content normal.    Assessment/Plan: 1.Cellulitis and inflammation Right breast.  Prior hx of right breast  Ca.  Ongoing oral chemotherapy.  2.s/p Right hemicolectomy for adenocarcinoma 04/2011. 3.Hx of anxiety/depression 4.Hypertension 5.Chronic back pain, multiple surgeries, on chronic narcotics for relief.   Plan:  Working to obtain an ultrasound of the right breast, and determine if she needs drainage or biopsy, or ongoing medical treatment for cellulitis.Will [facility] for Dr. Harlow Asa.   Jayziah Bankhead 03/27/2012, 1:22 PM

## 2012-03-27 NOTE — Progress Notes (Signed)
ANTIBIOTIC CONSULT NOTE - Initial (Zosyn); Follow-up (Vancomycyin, Acyclovir)  Pharmacy Consult for:  Vancomycin Indication:  Cellulitis of right breast  No Known Allergies  Patient Measurements: Height: 5\' 3"  (160 cm) Weight: 153 lb 6.4 oz (69.582 kg) IBW/kg (Calculated) : 52.4    Vital Signs: Temp: 97.8 F (36.6 C) (11/06 0538) Temp src: Oral (11/06 0538) BP: 136/61 mmHg (11/06 0538) Pulse Rate: 61  (11/06 0538)   Labs:  Basename 03/27/12 0625 03/26/12 0430 03/25/12 0409 03/24/12 1222  WBC 5.1 -- 8.6 8.2  HGB 11.1* -- 13.0 12.6  PLT 259 -- 235 202  LABCREA -- -- -- --  CREATININE 1.45* 1.24* 1.25* --   Estimated Creatinine Clearance: 36.2 ml/min (by C-G formula based on Cr of 1.45).  Microbiology: Blood cultures pending  Anti-infectives     Start     Dose/Rate Route Frequency Ordered Stop   03/27/12 1000   piperacillin-tazobactam (ZOSYN) IVPB 3.375 g        3.375 g 12.5 mL/hr over 240 Minutes Intravenous Every 8 hours 03/27/12 0939     03/25/12 1000   vancomycin (VANCOCIN) 1,250 mg in sodium chloride 0.9 % 250 mL IVPB        1,250 mg 166.7 mL/hr over 90 Minutes Intravenous Every 24 hours 03/24/12 1851     03/24/12 1800   acyclovir (ZOVIRAX) 530 mg in dextrose 5 % 100 mL IVPB        530 mg 110.6 mL/hr over 60 Minutes Intravenous Every 12 hours 03/24/12 1618     03/24/12 1500   vancomycin (VANCOCIN) IVPB 1000 mg/200 mL premix        1,000 mg 200 mL/hr over 60 Minutes Intravenous  Once 03/24/12 1400 03/24/12 1559          Assessment: 66 YO F with history of breast cancer presented with breast erythema and tenderness. Patient already receiving vancomycin and acyclovir. Pharmacy asked to dose Zosyn for gram negative coverage in patient with possible cellulitis.  Differential includes cellulitis vs zoster vs Paget's Disease  Day # 4 vancomycin/acyclovir  Renal function slightly worse today. Scr 1.45. CrCl ~ 36 ml/min  Blood cultures: NGTD  Goals of  Therapy:  -Vancomycin trough levels 10-15 mcg/ml -Eradication of infection -Appropriate doses for renal function  Plan:  1. Start Zosyn 3.375 G IV every 8 hours (4 hour infusion) 2. Continue Vancomycin 1250 mg IV every 24 hours 3. Continue Acyclovir 530 mg IV Q 12 hours 4. Obtain Vancomycin trough tomorrow prior to 1000 dose and adjust dose accordingly 5. Will continue to follow renal function and adjust doses accordingly   Doran Stabler, Pharm.D. Clinical Oncology Pharmacist  Pager # [phone]  03/27/2012 10:04 AM     ,

## 2012-03-27 NOTE — Progress Notes (Signed)
Patient was placed on Airborne Isolation at time of admission due to r/o shingles.  Patient has no vesicles, no open areas, no drainage.  Breast area is only reddened and painful.  Consulted with Jerline Pain, RN with Infection Prevention.  She reviewed chart and stated okay to discontinue isolation.  Airborne isolation discontinued and patient educated.  Zandra Abts [facility]  03/27/2012  4:02 PM

## 2012-03-28 ENCOUNTER — Inpatient Hospital Stay
Admit: 2012-03-28 | Discharge: 2012-03-28 | Disposition: A | Discharge status: Home / Self Care | Payer: PRIVATE HEALTH INSURANCE | Attending: General Surgery | Admitting: General Surgery

## 2012-03-28 DIAGNOSIS — L538 Other specified erythematous conditions: Secondary | ICD-10-CM

## 2012-03-28 LAB — CBC
HCT: 36.2 % (ref 36.0–46.0)
Hemoglobin: 12.1 g/dL (ref 12.0–15.0)
MCH: 29.6 pg (ref 26.0–34.0)
MCHC: 33.4 g/dL (ref 30.0–36.0)
RDW: 13.5 % (ref 11.5–15.5)

## 2012-03-28 LAB — BASIC METABOLIC PANEL
BUN: 18 mg/dL (ref 6–23)
Chloride: 98 mEq/L (ref 96–112)
Creatinine, Ser: 1.59 mg/dL — ABNORMAL HIGH (ref 0.50–1.10)
GFR calc Af Amer: 38 mL/min — ABNORMAL LOW (ref >90)
Glucose, Bld: 96 mg/dL (ref 70–99)
Potassium: 4.2 mEq/L (ref 3.5–5.1)

## 2012-03-28 MED ORDER — VANCOMYCIN HCL 1000 MG IV SOLR
750.0000 mg | INTRAVENOUS | Status: DC
  Filled 2012-03-28: qty 750

## 2012-03-28 MED ORDER — PIPERACILLIN-TAZOBACTAM 3.375 G IVPB
3.3750 g | Freq: Three times a day (TID) | INTRAVENOUS | Status: DC
  Administered 2012-03-29 – 2012-03-30 (×4): 3.375 g via INTRAVENOUS
  Filled 2012-03-28 (×5): qty 50

## 2012-03-28 MED ORDER — PNEUMOCOCCAL VAC POLYVALENT 25 MCG/0.5ML IJ INJ
0.5000 mL | INJECTION | INTRAMUSCULAR | Status: AC
  Filled 2012-03-28: qty 0.5

## 2012-03-28 NOTE — Progress Notes (Signed)
Pt known to me.  Admitted with right breast mastitis.  Started 5 days ago.  On abx and antivirals.  To breast center today for imaging. Would treat for now medically.  If not better in next 7 - 10 days,  Will need a punch biopsy in the office to exclude inflammatory breast cancer but the presentation seems unusual.  Favor   Either viral or bacterial.  Will need to see me next week in the office.  Can go home once more comfortable.  Await imaging.

## 2012-03-28 NOTE — Progress Notes (Signed)
[facility] PROGRESS NOTE  [name] [id number] DOB: 1945-12-23 DOA: 03/24/2012 PCP: Kristine Garbe, MD  Brief narrative: [name] is a 66 year old female with past medical history of colon cancer, breast cancer status post lumpectomy and radiation therapy to the right breast (diagnosed 08/2010),  right sided colon cancer and depression who presented to the hospital on 03/24/12 with right breast tenderness and erythema 1 day prior to this admission. Patient was started on vancomycin for possible cellulitis, acyclovir for possible shingles.  There has not been any significant improvement in erythema, so will add gram negative coverage.  For right breast ultrasound 03/28/12, surgery to consider punch biopsy to rule out inflammatory breast cancer.   Assessment/Plan: Principal Problem:  *Breast erythema most likely secondary to mastitis / cellulitis  Admitted and put on empiric Vancomycin and Acyclovir to cover cellulitis and shingles.  No appreciable improvement from inked margins of erythema, so Zosyn added 03/27/12.  Clinical appearance most consistent with mastitis, which seems to be slightly improved today.  Blood cultures negative to date.  Seen by surgery 03/27/12: For breast ultrasound today.  Dr. Humphrey Rolls consulted, saw patient 03/27/12.  She thinks findings are most consistent with cellulitis.  Continue pain control measures. Active Problems:  HTN (hypertension)  Controlled on Nebivolol and Lasix. Continue to hold lasix given rising creatinine.  Anxiety and depression  Continue Wellbutrin and fluvoxamine.  H/O Breast cancer status post lumpectomy, radiation therapy  Need to rule out inflammatory breast cancer.  Dr. Humphrey Rolls and surgery following patient and will consider punch biopsy.  Continue Arimidex.  AKI (acute kidney injury)  Creatinine rising.  Continue to hydrate.  May be from Vancomycin.  Right shoulder pain  Patient requests orthopedic doctor be called.   Notified and spoke with Jenetta Loges who kindly saw the patient 03/27/12.  Outpatient work up recommended.  Code Status: Full. Family Communication: No one at bedside. Disposition Plan: Home when stable.   Medical Consultants:  Dr. [name], Oncology.  Dr. Armandina Gemma, Surgery  Jenetta Loges, PA-C, Orthopedic surgery (per patient request)  Other Consultants:  Pharmacy  Anti-infectives:  Zosyn 03/27/12--->  Vancomycin 03/24/12--->  Acyclovir 03/24/12--->  HPI/Subjective: [name] is still having significant pain and discomfort in her left breast, rated 10/10 at worst.  Very anxious.  No N/V today, but had some nausea last night.    Objective: Filed Vitals:   03/27/12 0538 03/27/12 1400 03/27/12 2139 03/28/12 0518  BP: 136/61 123/60 144/67 105/63  Pulse: 61 116 60 57  Temp: 97.8 F (36.6 C) 98.2 F (36.8 C) 97.6 F (36.4 C) 97.8 F (36.6 C)  TempSrc: Oral Oral Oral Oral  Resp: 16 20 12 12   Height:      Weight:      SpO2: 92% 99% 97% 96%    Intake/Output Summary (Last 24 hours) at 03/28/12 1021 Last data filed at 03/28/12 0600  Gross per 24 hour  Intake 2796.2 ml  Output   1950 ml  Net  846.2 ml    Exam: Gen:  NAD Cardiovascular:  RRR, No M/R/G Respiratory:  Lungs CTAB Breast: Left breast with marked erythema which is beginning to regress from inked margins, no nipple discharge.  No left axillary lymphadenopathy. Gastrointestinal:  Abdomen soft, NT/ND, + BS Extremities:  No C/E/C  Data Reviewed: Basic Metabolic Panel:  Lab [id number] 03/27/12 0625 03/26/12 0430 03/25/12 0409 03/24/12 1222  NA 138 140 139 140 134*  K 4.2 4.0 -- -- --  CL  98 102 101 97 93*  CO2 29 29 31  35* 28  GLUCOSE 96 82 101* 112* 145*  BUN 18 17 16 15 18   CREATININE 1.59* 1.45* 1.24* 1.25* 1.17*  CALCIUM 10.1 9.8 9.7 10.3 10.2  MG -- -- -- -- --  PHOS -- -- -- -- --   GFR Estimated Creatinine Clearance: 33 ml/min (by C-G formula based on Cr of 1.59). Liver  Function Tests:  Lab 03/25/12 0409 03/24/12 1222  AST 21 19  ALT 11 10  ALKPHOS 115 118*  BILITOT 0.5 0.7  PROT 7.2 7.1  ALBUMIN 3.7 3.8    CBC:  Lab 03/28/12 0440 03/27/12 0625 03/25/12 0409 03/24/12 1222  WBC 5.2 5.1 8.6 8.2  NEUTROABS -- -- -- 6.0  HGB 12.1 11.1* 13.0 12.6  HCT 36.2 33.3* 39.3 37.5  MCV 88.5 88.3 87.5 86.6  PLT 263 259 235 202   BNP (last 3 results)  Basename 05/02/11 1145  PROBNP 3468.0*   Microbiology Recent Results (from the past 240 hour(s))  CULTURE, BLOOD (ROUTINE X 2)     Status: Normal (Preliminary result)   Collection Time   03/24/12 12:22 PM      Component Value Range Status Comment   Specimen Description BLOOD LEFT AC   Final    Special Requests BOTTLES DRAWN AEROBIC AND ANAEROBIC 3 CC EACH   Final    Culture  Setup Time 03/24/2012 23:59   Final    Culture     Final    Value:        BLOOD CULTURE RECEIVED NO GROWTH TO DATE CULTURE WILL BE HELD FOR 5 DAYS BEFORE ISSUING A FINAL NEGATIVE REPORT   Report Status PENDING   Incomplete   CULTURE, BLOOD (ROUTINE X 2)     Status: Normal (Preliminary result)   Collection Time   03/24/12 12:50 PM      Component Value Range Status Comment   Specimen Description BLOOD LEFT FA   Final    Special Requests BOTTLES DRAWN AEROBIC AND ANAEROBIC 4 CC EACH   Final    Culture  Setup Time 03/24/2012 23:59   Final    Culture     Final    Value:        BLOOD CULTURE RECEIVED NO GROWTH TO DATE CULTURE WILL BE HELD FOR 5 DAYS BEFORE ISSUING A FINAL NEGATIVE REPORT   Report Status PENDING   Incomplete      Procedures and Diagnostic Studies: Dg Chest 2 View  03/24/2012  *RADIOLOGY REPORT*  Clinical Data: Chest pain.  CHEST - 2 VIEW  Comparison: 05/02/2011.  Findings: The cardiac silhouette, mediastinal and hilar contours are within normal limits and stable.  The lungs are clear.  Remote rib fractures are again demonstrated.  Bilateral shoulder prostheses are noted.  IMPRESSION: No acute cardiopulmonary findings.    Original Report Authenticated By: Marijo Sanes, M.D.     Scheduled Meds:    . acyclovir  530 mg Intravenous Q12H  . anastrozole  1 mg Oral QHS  . buPROPion  300 mg Oral Daily  . docusate sodium  200 mg Oral QHS  . fluvoxaMINE  100 mg Oral QHS  . fluvoxaMINE  50 mg Oral Daily  . heparin  5,000 Units Subcutaneous Q8H  . nebivolol  10 mg Oral Daily  . OxyCODONE  60 mg Oral Q8H  . piperacillin-tazobactam (ZOSYN)  IV  3.375 g Intravenous Q8H  . polyethylene glycol  17 g Oral QODAY  . potassium  gluconate  595 mg Oral Daily  . vancomycin  1,250 mg Intravenous Q24H  . [DISCONTINUED] furosemide  40 mg Oral BID   Continuous Infusions:    . sodium chloride 75 mL/hr at 03/28/12 0130    Time spent: 30 minutes.   LOS: 4 days   [facility] Pager [phone].  If 8PM-8AM, please contact night-coverage at [email or web address], password [facility] 03/28/2012, 10:21 AM

## 2012-03-28 NOTE — Consult Note (Signed)
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 At her final pathology revealed a low grade invasive ductal carcinoma with 0 of 2 lymph nodes positive for metastatic disease. The tumor was ER positive PR positive HER-2/neu negative.   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 Post lumpectomy patient went on to receive radiation therapy from 12/28/2010 2 01/18/2011 to the right breast. She thereafter received single agent Arimidex 1 mg daily starting on 02/06/2011.   #2 05/01/2011 patient developed generalized weakness shortness of breath and syncopal episode. She had a CT of the head performed that was negative. She had CBC done with that showed a hemoglobin of 6.7. She received 3 units of packed red cells. She was severely iron deficient. FOB was positive. Patient had a GI consult performed during her hospitalization and on 1212 she underwent a colonoscopy that revealed a near obstructing colonic mass worrisome for a hepatic flexure tumor. She had biopsies performed that revealed an adenocarcinoma. She went on to have a right segmental  resection performed on 05/05/2011. The final pathology revealed a 5.0 cm poorly differentiated high grade invasive adenocarcinoma with patchy mucinous features invading through the muscularis propria into pericolonic fat he tissue 34 lymph nodes were negative for metastatic disease. (T3 N0)   CURRENT THERAPY:   1. Breast cancer: Arimidex . Last mammogram on 12/19/11 was negative. CXR 11/3 was negative.  2. Colon Cancer: observation   Patient was last seen at the office of the [facility] on 09/25/11   History of present illness:66 y.o.  female admitted on 11/03  under the Hospitalist service for evaluation of R breast erythema, likely cellulitis.Upon admission, she was placed on Vanco and Acyclovir, with added Zosyn to the regimen on 11/06 without improvement on the breat tissue. Despite clinical appearance is consistent with mastitis, General Surgery has evaluated the patient to rule out the presence of malignant tissue. She is to undergo R breast ultrasound today, followed by punch biopsy by Dr. Harlow Asa.  We were informed of the patient's admission while other medical problems are being addressed by the admitting team, including chronic back pain, acute renal insufficiency, likely related to antibiotics, among other issues.       Past medical history:      Past Medical History  Diagnosis Date  . Hypertension   . Depression   . Back pain, chronic   . Breast cancer 09/09/10  . Cancer of colon 05/24/2011    Past surgical history:      Past Surgical History  Procedure Date  . Back surgery   . Shoulder surgery   . Tumor removal Tumor removed R breast  . Lumbar disc surgery   .  Partial hysterectomy   . Colonoscopy 05/03/2011    Procedure: COLONOSCOPY;  Surgeon: Jeryl Columbia, MD;  Location: [facility] ENDOSCOPY;  Service: Endoscopy;  Laterality: N/A;  . Colon surgery 04/2011  . Breast lumpectomy 10/10/2010    Rt Breast  . Joint replacement     R&L total shoulder replacements    Medications:    Prior to Admission:     . acyclovir  530 mg Intravenous Q12H  . anastrozole  1 mg Oral QHS  . buPROPion  300 mg Oral Daily  . docusate sodium  200 mg Oral QHS  . fluvoxaMINE  100 mg Oral QHS  . fluvoxaMINE  50 mg Oral Daily  . heparin  5,000 Units Subcutaneous Q8H  . nebivolol  10 mg Oral Daily  . OxyCODONE  60 mg Oral Q8H  . piperacillin-tazobactam (ZOSYN)  IV  3.375 g Intravenous Q8H  . polyethylene glycol  17 g Oral QODAY  . potassium gluconate  595 mg Oral Daily  . vancomycin  750 mg Intravenous Q24H  . [DISCONTINUED] furosemide  40 mg Oral BID  . [DISCONTINUED] vancomycin  1,250 mg Intravenous Q24H    [id number], acetaminophen, ALPRAZolam, hydrocortisone, HYDROmorphone (DILAUDID) injection, ondansetron, promethazine  Allergies: No Known Allergies  Family history:     Family History  Problem Relation Age of Onset  . Diabetes Mother   . Hypertension Mother   . Cancer Mother   . Sudden death Mother   . Anesthesia problems Neg Hx   . Hypotension Neg Hx   . Malignant hyperthermia Neg Hx   . Pseudochol deficiency Neg Hx     Health maintenance:                 History  Substance Use Topics  . Smoking status: Former Smoker -- 1.0 packs/day    Types: Cigarettes  . Smokeless tobacco: Never Used  . Alcohol Use: No   Review of systems: as per HPI. No fever, chills or night sweats, no confusion. No respiratory or cardiac complaints.Rest of ROS is  Essentially. negative.   Allergies:    No Known Allergies   Physical exam:      Filed Vitals:   03/28/12 0518  BP: 105/63  Pulse: 57  Temp: 97.8 F (36.6 C)  Resp: 12     Body mass index is 27.17 kg/(m^2).    General:  66 y.o. female   in no acute distress A. and O. x3  well-developed and well-nourished.  HEENT: Normocephalic, atraumatic, PERRLA. Oral cavity without thrush or lesions. Neck supple. no thyromegaly, no cervical or supraclavicular adenopathy  Lungs clear bilaterally . No wheezing, rhonchi or  rales. No axillary masses. Breasts: R breast with severe erythema throughout , tender to palpation, no nipple discharge.  Cardiac regular rate and rhythm normal S1-S2, no murmur , rubs or gallops Abdomen soft nontender,  bowel sounds x4. No HSM. No masses palpable.  GU/rectal: deferred. Extremities no clubbing, no  cyanosis or edema. No bruising or petechial rash Musculoskeletal: no spinal tenderness.  Neuro: Non Focal   Lab results:       CBC  Lab 03/28/12 0440 03/27/12 0625 03/25/12 0409 03/24/12 1222  WBC 5.2 5.1 8.6 8.2  HGB 12.1 11.1* 13.0 12.6  HCT 36.2 33.3* 39.3 37.5  PLT 263 259 235 202  MCV 88.5 88.3 87.5 86.6  MCH 29.6 29.4 29.0 29.1  MCHC 33.4 33.3 33.1 33.6  RDW 13.5 13.6 13.6 13.7  LYMPHSABS -- -- -- 1.4  MONOABS -- -- --  0.8  EOSABS -- -- -- 0.0  BASOSABS -- -- -- 0.0  BANDABS -- -- -- --    Anemia panel:  No results found for this basename: VITAMINB12:2,FOLATE:2,FERRITIN:2,TIBC:2,IRON:2,RETICCTPCT:2 in the last 72 hours   Chemistries   Lab 03/28/12 0440 03/27/12 0625 03/26/12 0430 03/25/12 0409 03/24/12 1222  NA 138 140 139 140 134*  K 4.2 4.0 2.7* 3.7 3.4*  CL 98 102 101 97 93*  CO2 29 29 31  35* 28  GLUCOSE 96 82 101* 112* 145*  BUN 18 17 16 15 18   CREATININE 1.59* 1.45* 1.24* 1.25* 1.17*  CALCIUM 10.1 9.8 9.7 10.3 10.2  MG -- -- -- -- --          Dg Chest 2 View  03/24/2012  *RADIOLOGY REPORT*  Clinical Data: Chest pain.  CHEST - 2 VIEW  Comparison: 05/02/2011.  Findings: The cardiac silhouette, mediastinal and hilar contours are within normal limits and stable.  The lungs are clear.  Remote rib fractures are again demonstrated.  Bilateral shoulder prostheses are noted.  IMPRESSION: No acute cardiopulmonary findings.   Original Report Authenticated By: Marijo Sanes, M.D.     Assessmnent/Plan:66 y.o. female  with 2 primary malignancies:   1. 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   2. Stage II adenocarcinoma of the colon status post segmental resection. On observation only.  3. R breast Cellulitis: On Vancomycin, Zosyn  and acyclovir since 11/3 without improvement in the affected tissue.  General Surgery is involved in the patient's care. She is to undergo breast ultrasound today at the [facility] , as well as possible punch biopsy of the involved tissue, to rule out malignancy.  Dr. Brantley Stage, (General Surgery), has been informed of the patient's admission as well .   4. Other medical issues as, as per admitting team.   Dr. Humphrey Rolls  Is to write an addendum to this note .  Thank you for the referral.   [facility] 03/28/2012   ATTENDING'S ATTESTATION:  I personally reviewed patient's chart, examined patient myself, formulated the treatment plan as followed.    patient likely has cellulitis. Recommend continuing present antibiotic regimen.  [name], MD Medical/Oncology [facility] [phone] (beeper) [phone] (Office)  03/28/2012, 10:01 PM

## 2012-03-28 NOTE — Progress Notes (Signed)
ANTIBIOTIC CONSULT NOTE - FOLLOW UP  Pharmacy Consult for Vancomycin Indication: R breast cellulitis  No Known Allergies  Patient Measurements: Height: 5\' 3"  (160 cm) Weight: 153 lb 6.4 oz (69.582 kg) IBW/kg (Calculated) : 52.4   Vital Signs: Temp: 97.8 F (36.6 C) (11/07 0518) Temp src: Oral (11/07 0518) BP: 105/63 mmHg (11/07 0518) Pulse Rate: 57  (11/07 0518) Intake/Output from previous day: 11/06 0701 - 11/07 0700 In: 2796.2 [P.O.:1300; I.V.:1125; IV Piggyback:371.2] Out: 1950 [id number] Intake/Output from this shift:    Labs:  Basename 03/28/12 0440 03/27/12 0625 03/26/12 0430  WBC 5.2 5.1 --  HGB 12.1 11.1* --  PLT 263 259 --  LABCREA -- -- --  CREATININE 1.59* 1.45* 1.24*   Estimated Creatinine Clearance: 33 ml/min (by C-G formula based on Cr of 1.59).  Basename 03/28/12 0924  VANCOTROUGH 18.1  VANCOPEAK --  VANCORANDOM --  GENTTROUGH --  GENTPEAK --  GENTRANDOM --  TOBRATROUGH --  TOBRAPEAK --  TOBRARND --  AMIKACINPEAK --  AMIKACINTROU --  AMIKACIN --     Microbiology: Recent Results (from the past 720 hour(s))  CULTURE, BLOOD (ROUTINE X 2)     Status: Normal (Preliminary result)   Collection Time   03/24/12 12:22 PM      Component Value Range Status Comment   Specimen Description BLOOD LEFT AC   Final    Special Requests BOTTLES DRAWN AEROBIC AND ANAEROBIC 3 CC EACH   Final    Culture  Setup Time 03/24/2012 23:59   Final    Culture     Final    Value:        BLOOD CULTURE RECEIVED NO GROWTH TO DATE CULTURE WILL BE HELD FOR 5 DAYS BEFORE ISSUING A FINAL NEGATIVE REPORT   Report Status PENDING   Incomplete   CULTURE, BLOOD (ROUTINE X 2)     Status: Normal (Preliminary result)   Collection Time   03/24/12 12:50 PM      Component Value Range Status Comment   Specimen Description BLOOD LEFT FA   Final    Special Requests BOTTLES DRAWN AEROBIC AND ANAEROBIC 4 CC EACH   Final    Culture  Setup Time 03/24/2012 23:59   Final    Culture     Final     Value:        BLOOD CULTURE RECEIVED NO GROWTH TO DATE CULTURE WILL BE HELD FOR 5 DAYS BEFORE ISSUING A FINAL NEGATIVE REPORT   Report Status PENDING   Incomplete     Anti-infectives     Start     Dose/Rate Route Frequency Ordered Stop   03/27/12 1000  piperacillin-tazobactam (ZOSYN) IVPB 3.375 g       3.375 g 12.5 mL/hr over 240 Minutes Intravenous Every 8 hours 03/27/12 0939     03/25/12 1000   vancomycin (VANCOCIN) 1,250 mg in sodium chloride 0.9 % 250 mL IVPB        1,250 mg 166.7 mL/hr over 90 Minutes Intravenous Every 24 hours 03/24/12 1851     03/24/12 1800   acyclovir (ZOVIRAX) 530 mg in dextrose 5 % 100 mL IVPB        530 mg 110.6 mL/hr over 60 Minutes Intravenous Every 12 hours 03/24/12 1618     03/24/12 1500   vancomycin (VANCOCIN) IVPB 1000 mg/200 mL premix        1,000 mg 200 mL/hr over 60 Minutes Intravenous  Once 03/24/12 1400 03/24/12 1559  Assessment: 66 YO F with history of breast cancer presented with breast erythema and tenderness. Patient already receiving vancomycin and acyclovir. Pharmacy asked to dose Zosyn for gram negative coverage in patient with possible cellulitis.  Differential includes cellulitis vs zoster vs Paget's Disease  Day # 5 vancomycin/acyclovir, Day #2 Zosyn Renal function slightly worse today. Scr 1.59. CrCl ~ 33 ml/min  Blood cultures: NGTD Vanc trough supratherapeutic   Goal of Therapy:  Vancomycin trough level 10-15 mcg/ml  Plan:  1) Decrease vancomycin dose from 1250mg  IV q24 down to 750mg  IV q24 2) Recheck vanc trough as necessary 3) Continue to monitor renal function   Adrian Saran, PharmD, BCPS Pager [phone] 03/28/2012 10:58 AM

## 2012-03-28 NOTE — Progress Notes (Signed)
Patient arrived back safely to floor around 3:40PM after breast ultrasound at [facility]. Leave of absence form signed by RN and patient upon departure and arrival back to unit and placed in patient's chart. Cindi Carbon, RN

## 2012-03-28 NOTE — Progress Notes (Signed)
  Subjective: No significant change, going for [name] of her breast today.    Objective: Vital signs in last 24 hours: Temp:  [97.6 F (36.4 C)-98.2 F (36.8 C)] 97.8 F (36.6 C) (11/07 0518) Pulse Rate:  [57-116] 57  (11/07 0518) Resp:  [12-20] 12  (11/07 0518) BP: (105-144)/(60-67) 105/63 mmHg (11/07 0518) SpO2:  [96 %-99 %] 96 % (11/07 0518) Last BM Date: 03/25/12 Diet: regular, VSS, labs OK  Intake/Output from previous day: 11/06 0701 - 11/07 0700 In: 2796.2 [P.O.:1300; I.V.:1125; IV Piggyback:371.2] Out: 1950 [id number] Intake/Output this shift:    Breasts: normal appearance, no masses or tenderness, erythematous, tender, no significant change.  Lab Results:   Basename 03/28/12 0440 03/27/12 0625  WBC 5.2 5.1  HGB 12.1 11.1*  HCT 36.2 33.3*  PLT 263 259    BMET  Basename 03/28/12 0440 03/27/12 0625  NA 138 140  K 4.2 4.0  CL 98 102  CO2 29 29  GLUCOSE 96 82  BUN 18 17  CREATININE 1.59* 1.45*  CALCIUM 10.1 9.8   PT/INR No results found for this basename: LABPROT:2,INR:2 in the last 72 hours   Lab 03/25/12 0409 03/24/12 1222  AST 21 19  ALT 11 10  ALKPHOS 115 118*  BILITOT 0.5 0.7  PROT 7.2 7.1  ALBUMIN 3.7 3.8     Lipase  No results found for this basename: lipase     Studies/Results: No results found.  Medications:    . acyclovir  530 mg Intravenous Q12H  . anastrozole  1 mg Oral QHS  . buPROPion  300 mg Oral Daily  . docusate sodium  200 mg Oral QHS  . fluvoxaMINE  100 mg Oral QHS  . fluvoxaMINE  50 mg Oral Daily  . heparin  5,000 Units Subcutaneous Q8H  . nebivolol  10 mg Oral Daily  . OxyCODONE  60 mg Oral Q8H  . piperacillin-tazobactam (ZOSYN)  IV  3.375 g Intravenous Q8H  . polyethylene glycol  17 g Oral QODAY  . potassium gluconate  595 mg Oral Daily  . vancomycin  1,250 mg Intravenous Q24H  . [DISCONTINUED] furosemide  40 mg Oral BID      . sodium chloride 75 mL/hr at 03/28/12 0130    Assessment/Plan 1.Cellulitis  and inflammation Right breast. Prior hx of right breast Ca. Ongoing oral chemotherapy.  2.s/p Right hemicolectomy for adenocarcinoma 04/2011.  3.Hx of anxiety/depression  4.Hypertension  5.Chronic back pain, multiple surgeries, on chronic narcotics for relief.   Plan:  No real change on antibiotics for 3 days and acyclovir. Awaiting [name], and decide on need for punch bx.       LOS: 4 days    Annia Gomm 03/28/2012

## 2012-03-29 ENCOUNTER — Telehealth (INDEPENDENT_AMBULATORY_CARE_PROVIDER_SITE_OTHER): Payer: Self-pay

## 2012-03-29 ENCOUNTER — Ambulatory Visit: Payer: PRIVATE HEALTH INSURANCE | Admitting: Oncology

## 2012-03-29 DIAGNOSIS — L538 Other specified erythematous conditions: Secondary | ICD-10-CM

## 2012-03-29 LAB — BASIC METABOLIC PANEL
BUN: 23 mg/dL (ref 6–23)
CO2: 29 mEq/L (ref 19–32)
Calcium: 9.7 mg/dL (ref 8.4–10.5)
Creatinine, Ser: 1.88 mg/dL — ABNORMAL HIGH (ref 0.50–1.10)
GFR calc non Af Amer: 27 mL/min — ABNORMAL LOW (ref >90)
Glucose, Bld: 97 mg/dL (ref 70–99)

## 2012-03-29 LAB — CBC
MCH: 29.3 pg (ref 26.0–34.0)
MCHC: 32.8 g/dL (ref 30.0–36.0)
MCV: 89.5 fL (ref 78.0–100.0)
Platelets: 256 10*3/uL (ref 150–400)
RBC: 3.99 MIL/uL (ref 3.87–5.11)
RDW: 13.6 % (ref 11.5–15.5)

## 2012-03-29 MED ORDER — DOXYCYCLINE HYCLATE 100 MG PO TABS
100.0000 mg | ORAL_TABLET | Freq: Two times a day (BID) | ORAL | Status: DC
  Administered 2012-03-29 (×2): 100 mg via ORAL
  Filled 2012-03-29 (×4): qty 1

## 2012-03-29 NOTE — Progress Notes (Signed)
[facility] PROGRESS NOTE  [name] [id number] DOB: 08-Jun-1945 DOA: 03/24/2012 PCP: Kristine Garbe, MD  Brief narrative: [name] is a 66 year old female with past medical history of colon cancer, breast cancer status post lumpectomy and radiation therapy to the right breast (diagnosed 08/2010),  right sided colon cancer and depression who presented to the hospital on 03/24/12 with right breast tenderness and erythema 1 day prior to this admission. Patient was started on vancomycin for possible cellulitis, acyclovir for possible shingles.  There has not been any significant improvement in erythema, so will add gram negative coverage.  For right breast ultrasound 03/28/12, surgery to consider punch biopsy to rule out inflammatory breast cancer.   Assessment/Plan: Principal Problem:  *Breast erythema most likely secondary to mastitis / cellulitis  Admitted and put on empiric Vancomycin and Acyclovir to cover cellulitis and shingles.  No appreciable improvement from inked margins of erythema, so Zosyn added 03/27/12.  Clinical appearance most consistent with mastitis, unchanged from yesterday.  Blood cultures negative to date.  D/C Vancomycin and acyclovir.  Start doxycycline.  Continue zosyn.  Seen by surgery 03/27/12: Status post breast ultrasound 03/28/12: Findings consistent with cellulitis, no abscess.  Dr. Humphrey Rolls consulted, saw patient 03/27/12.  She thinks findings are most consistent with cellulitis.  Continue pain control measures. Active Problems:  HTN (hypertension)  Controlled on Nebivolol and Lasix. Continue to hold lasix given rising creatinine.  Anxiety and depression  Continue Wellbutrin and fluvoxamine.  H/O Breast cancer status post lumpectomy, radiation therapy  Not thought to be inflammatory breast cancer by Dr. Humphrey Rolls or Dr. Brantley Stage.  Continue Arimidex.  AKI (acute kidney injury)  Creatinine rising despite IVF.  May be from Vancomycin.  D/C Vancomycin.   Start oral doxycycline.  Right shoulder pain  Patient requests orthopedic doctor be called.  Notified and spoke with Jenetta Loges who kindly saw the patient 03/27/12.  Outpatient work up recommended.  Code Status: Full. Family Communication: No one at bedside. Disposition Plan: Home when stable.   Medical Consultants:  Dr. [name], Oncology.  Dr. Armandina Gemma, Surgery  Jenetta Loges, PA-C, Orthopedic surgery (per patient request)  Other Consultants:  Pharmacy  Anti-infectives:  Zosyn 03/27/12--->  Vancomycin 03/24/12--->03/29/12  Acyclovir 03/24/12--->03/29/12  Doxycycline 03/29/12--->  HPI/Subjective: [name] is still having pain and discomfort in her left breast.  Very anxious.  Complains of diarrhea, and being too "weak" to go home today.  Objective: Filed Vitals:   03/27/12 2139 03/28/12 0518 03/28/12 2243 03/29/12 0521  BP: 144/67 105/63 136/50 124/53  Pulse: 60 57 62 56  Temp: 97.6 F (36.4 C) 97.8 F (36.6 C) 98.1 F (36.7 C) 97.3 F (36.3 C)  TempSrc: Oral Oral Oral Oral  Resp: 12 12 18 16   Height:      Weight:      SpO2: 97% 96% 92% 98%    Intake/Output Summary (Last 24 hours) at 03/29/12 1037 Last data filed at 03/29/12 0600  Gross per 24 hour  Intake 1096.2 ml  Output    800 ml  Net  296.2 ml    Exam: Gen:  NAD Cardiovascular:  RRR, No M/R/G Respiratory:  Lungs CTAB Breast: Left breast with marked erythema which is beginning to regress from inked margins, no nipple discharge.  No change from yesterday's exam.  No left axillary lymphadenopathy. Gastrointestinal:  Abdomen soft, NT/ND, + BS Extremities:  No C/E/C  Data Reviewed: Basic Metabolic Panel:  Lab [id number] 03/28/12 0440 03/27/12 0625 03/26/12 0430  03/25/12 0409  NA 141 138 140 139 140  K 4.1 4.2 -- -- --  CL 103 98 102 101 97  CO2 29 29 29 31  35*  GLUCOSE 97 96 82 101* 112*  BUN 23 18 17 16 15   CREATININE 1.88* 1.59* 1.45* 1.24* 1.25*  CALCIUM 9.7 10.1 9.8 9.7  10.3  MG -- -- -- -- --  PHOS -- -- -- -- --   GFR Estimated Creatinine Clearance: 27.9 ml/min (by C-G formula based on Cr of 1.88). Liver Function Tests:  Lab 03/25/12 0409 03/24/12 1222  AST 21 19  ALT 11 10  ALKPHOS 115 118*  BILITOT 0.5 0.7  PROT 7.2 7.1  ALBUMIN 3.7 3.8    CBC:  Lab 03/29/12 0503 03/28/12 0440 03/27/12 0625 03/25/12 0409 03/24/12 1222  WBC 4.8 5.2 5.1 8.6 8.2  NEUTROABS -- -- -- -- 6.0  HGB 11.7* 12.1 11.1* 13.0 12.6  HCT 35.7* 36.2 33.3* 39.3 37.5  MCV 89.5 88.5 88.3 87.5 86.6  PLT 256 263 259 235 202   BNP (last 3 results)  Basename 05/02/11 1145  PROBNP 3468.0*   Microbiology Recent Results (from the past 240 hour(s))  CULTURE, BLOOD (ROUTINE X 2)     Status: Normal (Preliminary result)   Collection Time   03/24/12 12:22 PM      Component Value Range Status Comment   Specimen Description BLOOD LEFT AC   Final    Special Requests BOTTLES DRAWN AEROBIC AND ANAEROBIC 3 CC EACH   Final    Culture  Setup Time 03/24/2012 23:59   Final    Culture     Final    Value:        BLOOD CULTURE RECEIVED NO GROWTH TO DATE CULTURE WILL BE HELD FOR 5 DAYS BEFORE ISSUING A FINAL NEGATIVE REPORT   Report Status PENDING   Incomplete   CULTURE, BLOOD (ROUTINE X 2)     Status: Normal (Preliminary result)   Collection Time   03/24/12 12:50 PM      Component Value Range Status Comment   Specimen Description BLOOD LEFT FA   Final    Special Requests BOTTLES DRAWN AEROBIC AND ANAEROBIC 4 CC EACH   Final    Culture  Setup Time 03/24/2012 23:59   Final    Culture     Final    Value:        BLOOD CULTURE RECEIVED NO GROWTH TO DATE CULTURE WILL BE HELD FOR 5 DAYS BEFORE ISSUING A FINAL NEGATIVE REPORT   Report Status PENDING   Incomplete      Procedures and Diagnostic Studies: Dg Chest 2 View  03/24/2012  *RADIOLOGY REPORT*  Clinical Data: Chest pain.  CHEST - 2 VIEW  Comparison: 05/02/2011.  Findings: The cardiac silhouette, mediastinal and hilar contours are  within normal limits and stable.  The lungs are clear.  Remote rib fractures are again demonstrated.  Bilateral shoulder prostheses are noted.  IMPRESSION: No acute cardiopulmonary findings.   Original Report Authenticated By: Marijo Sanes, M.D.     Scheduled Meds:    . acyclovir  530 mg Intravenous Q12H  . anastrozole  1 mg Oral QHS  . buPROPion  300 mg Oral Daily  . docusate sodium  200 mg Oral QHS  . fluvoxaMINE  100 mg Oral QHS  . fluvoxaMINE  50 mg Oral Daily  . heparin  5,000 Units Subcutaneous Q8H  . nebivolol  10 mg Oral Daily  . OxyCODONE  60  mg Oral Q8H  . piperacillin-tazobactam (ZOSYN)  IV  3.375 g Intravenous Q8H  . pneumococcal 23 valent vaccine  0.5 mL Intramuscular Tomorrow-1000  . polyethylene glycol  17 g Oral QODAY  . potassium gluconate  595 mg Oral Daily  . vancomycin  750 mg Intravenous Q24H  . [DISCONTINUED] piperacillin-tazobactam (ZOSYN)  IV  3.375 g Intravenous Q8H  . [DISCONTINUED] vancomycin  1,250 mg Intravenous Q24H   Continuous Infusions:    . sodium chloride 75 mL/hr at 03/28/12 0130    Time spent: 25 minutes.   LOS: 5 days   [facility] Pager [phone].  If 8PM-8AM, please contact night-coverage at [email or web address], password [facility] 03/29/2012, 10:37 AM

## 2012-03-29 NOTE — Telephone Encounter (Signed)
Called patient to give her follow up appointment for 11/15 @ 10;10. Only  number on file disconnected. I will try to keep calling patient.

## 2012-03-29 NOTE — Progress Notes (Signed)
Subjective: Sore breasr  Objective: Vital signs in last 24 hours: Temp:  [97.3 F (36.3 C)-98.1 F (36.7 C)] 97.3 F (36.3 C) (11/08 0521) Pulse Rate:  [56-62] 56  (11/08 0521) Resp:  [16-18] 16  (11/08 0521) BP: (124-136)/(50-53) 124/53 mmHg (11/08 0521) SpO2:  [92 %-98 %] 98 % (11/08 0521) Last BM Date: 03/25/12  Intake/Output from previous day: 11/07 0701 - 11/08 0700 In: 1096.2 [P.O.:600; I.V.:225; IV Piggyback:271.2] Out: 800 [Urine:800] Intake/Output this shift:    redness right breast is less.  some dependent rubor noted along chest wakk but overall  better.  no abscess  Lab Results:   Basename 03/29/12 0503 03/28/12 0440  WBC 4.8 5.2  HGB 11.7* 12.1  HCT 35.7* 36.2  PLT 256 263   BMET  Basename 03/29/12 0503 03/28/12 0440  NA 141 138  K 4.1 4.2  CL 103 98  CO2 29 29  GLUCOSE 97 96  BUN 23 18  CREATININE 1.88* 1.59*  CALCIUM 9.7 10.1   PT/INR No results found for this basename: LABPROT:2,INR:2 in the last 72 hours ABG No results found for this basename: PHART:2,PCO2:2,PO2:2,HCO3:2 in the last 72 hours  Studies/Results: US Breast Right  03/28/2012  *RADIOLOGY REPORT*  Clinical Data:  History of right lumpectomy for breast cancer with radiation therapy 2012.  The patient is currently hospitalized for right breast erythema and fever.  DIGITAL DIAGNOSTIC THE RIGHT MAMMOGRAM WITH CAD AND RIGHT BREAST ULTRASOUND:  Comparison:  Prior [facility] mammograms of April 2012  Findings:  Right upper outer quadrant lumpectomy changes are noted. There has been interval development of right breast skin and trabecular thickening.  No identifiable underlying intramammary mass, nonsurgical architectural distortion, or calcification is seen.  No right axillary lymphadenopathy. Mammographic images were processed with CAD.  On physical exam, there is diffuse sharply demarcated right breast erythema and swelling.  I do not palpate a focal mass.  Ultrasound is performed, showing  diffuse skin and trabecular edema with skin thickness measuring maximally 1.5 cm but no identifiable solid or fluid mass or lesion within the central right breast.  IMPRESSION: Diffuse right breast skin and trabecular edema without drainable fluid collection or solid mass identified.  Differential considerations include post radiation change, cellulitis, or other inflammation.  Lymphangitic spread of tumor is considered less likely given the short interval since the initial treatment but is also possible. These results were called by telephone on 03/28/2012 at 2:45 p.m. to Dr. Brantley Stage, who verbally acknowledged these results.  RECOMMENDATION: Clinical follow-up, bilateral diagnostic mammography April 2014  I have discussed the findings and recommendations with the patient. Results were also provided in writing at the conclusion of the visit.  BI-RADS CATEGORY 2:  Benign finding(s).   Original Report Authenticated By: Conchita Paris, M.D.    Mm Digital Diagnostic Unilat R  03/28/2012  *RADIOLOGY REPORT*  Clinical Data:  History of right lumpectomy for breast cancer with radiation therapy 2012.  The patient is currently hospitalized for right breast erythema and fever.  DIGITAL DIAGNOSTIC THE RIGHT MAMMOGRAM WITH CAD AND RIGHT BREAST ULTRASOUND:  Comparison:  Prior [facility] mammograms of April 2012  Findings:  Right upper outer quadrant lumpectomy changes are noted. There has been interval development of right breast skin and trabecular thickening.  No identifiable underlying intramammary mass, nonsurgical architectural distortion, or calcification is seen.  No right axillary lymphadenopathy. Mammographic images were processed with CAD.  On physical exam, there is diffuse sharply demarcated right breast erythema and swelling.  I do  not palpate a focal mass.  Ultrasound is performed, showing diffuse skin and trabecular edema with skin thickness measuring maximally 1.5 cm but no identifiable solid or fluid mass or  lesion within the central right breast.  IMPRESSION: Diffuse right breast skin and trabecular edema without drainable fluid collection or solid mass identified.  Differential considerations include post radiation change, cellulitis, or other inflammation.  Lymphangitic spread of tumor is considered less likely given the short interval since the initial treatment but is also possible. These results were called by telephone on 03/28/2012 at 2:45 p.m. to Dr. Brantley Stage, who verbally acknowledged these results.  RECOMMENDATION: Clinical follow-up, bilateral diagnostic mammography April 2014  I have discussed the findings and recommendations with the patient. Results were also provided in writing at the conclusion of the visit.  BI-RADS CATEGORY 2:  Benign finding(s).   Original Report Authenticated By: Conchita Paris, M.D.     Anti-infectives: Anti-infectives     Start     Dose/Rate Route Frequency Ordered Stop   03/29/12 1200   vancomycin (VANCOCIN) 750 mg in sodium chloride 0.9 % 150 mL IVPB        750 mg 150 mL/hr over 60 Minutes Intravenous Every 24 hours 03/28/12 1059     03/29/12 0100   piperacillin-tazobactam (ZOSYN) IVPB 3.375 g        3.375 g 12.5 mL/hr over 240 Minutes Intravenous Every 8 hours 03/28/12 1647     03/27/12 1000   piperacillin-tazobactam (ZOSYN) IVPB 3.375 g  Status:  Discontinued        3.375 g 12.5 mL/hr over 240 Minutes Intravenous Every 8 hours 03/27/12 0939 03/28/12 1647   03/25/12 1000   vancomycin (VANCOCIN) 1,250 mg in sodium chloride 0.9 % 250 mL IVPB  Status:  Discontinued        1,250 mg 166.7 mL/hr over 90 Minutes Intravenous Every 24 hours 03/24/12 1851 03/28/12 1059   03/24/12 1800   acyclovir (ZOVIRAX) 530 mg in dextrose 5 % 100 mL IVPB        530 mg 110.6 mL/hr over 60 Minutes Intravenous Every 12 hours 03/24/12 1618     03/24/12 1500   vancomycin (VANCOCIN) IVPB 1000 mg/200 mL premix        1,000 mg 200 mL/hr over 60 Minutes Intravenous  Once 03/24/12  1400 03/24/12 1559          Assessment/Plan: Post radiation mastitis right breast Looks better on exam.  Pain and redness will take a while to resolve.  U/S shows no abscess.  I doubt inflammatory breast CA.  She can go home on ABX  Doxycycline or clindamycin and follow up next week with me.  I will make arrangements.   LOS: 5 days    Yoni Lobos A. 03/29/2012

## 2012-03-30 DIAGNOSIS — L538 Other specified erythematous conditions: Secondary | ICD-10-CM

## 2012-03-30 LAB — CULTURE, BLOOD (ROUTINE X 2): Culture: NO GROWTH

## 2012-03-30 LAB — BASIC METABOLIC PANEL
BUN: 18 mg/dL (ref 6–23)
CO2: 29 mEq/L (ref 19–32)
Chloride: 107 mEq/L (ref 96–112)
GFR calc non Af Amer: 32 mL/min — ABNORMAL LOW (ref >90)
Glucose, Bld: 101 mg/dL — ABNORMAL HIGH (ref 70–99)
Potassium: 3.6 mEq/L (ref 3.5–5.1)

## 2012-03-30 MED ORDER — CIPROFLOXACIN HCL 500 MG PO TABS: 500.0000 mg | ORAL_TABLET | Freq: Two times a day (BID) | ORAL | Status: DC

## 2012-03-30 MED ORDER — DOXYCYCLINE HYCLATE 100 MG PO TABS: 100.0000 mg | ORAL_TABLET | Freq: Two times a day (BID) | ORAL | Status: DC

## 2012-03-30 MED ORDER — HYDROCORTISONE 2.5 % RE CREA: TOPICAL_CREAM | Freq: Three times a day (TID) | RECTAL | Status: DC | PRN

## 2012-03-30 NOTE — Progress Notes (Signed)
Patient given discharge instructions using teach back method and she verbalized understanding.  Patient understands follow up with her doctors.  Patient understands when she would need to notify the MD.  Patient stable for discharge.  Assessment unchanged from this am.  [name] [facility]  03/30/2012  11:44 AM

## 2012-03-30 NOTE — Discharge Summary (Signed)
Physician Discharge Summary  [name] [id number] DOB: 1945/12/09 DOA: 03/24/2012  PCP: Kristine Garbe, MD  Admit date: 03/24/2012 Discharge date: 03/30/2012  Recommendations for Outpatient Follow-up:  1. F/U PCP in 1 week: Please check renal function and follow up final blood culture results (negative to date). 2. F/U scheduled with Dr. Brantley Stage 04/05/12.   3. F/U with Dr. Onnie Graham for ongoing shoulder pain. 4. Clinical follow-up, bilateral diagnostic mammography April 2014 recommended by radiology.  Discharge Diagnoses:  Principal Problem:  *Breast erythema most likely secondary to mastitis / cellulitis Active Problems:  HTN (hypertension)  Anxiety and depression  H/O Breast cancer status post lumpectomy, radiation therapy  AKI (acute kidney injury)  Right shoulder pain   Discharge Condition: Stable, improving.  Diet recommendation: Regular.  History of present illness:  [name] is a 66 year old female with past medical history of colon cancer, breast cancer status post lumpectomy and radiation therapy to the right breast (diagnosed 08/2010), right sided colon cancer and depression who presented to the hospital on 03/24/12 with right breast tenderness and erythema 1 day prior to this admission.   Hospital Course by problem:  Principal Problem:  *Breast erythema most likely secondary to mastitis / cellulitis   Admitted and put on empiric Vancomycin and Acyclovir to cover cellulitis and shingles. No appreciable improvement from inked margins of erythema, so Zosyn added 03/27/12. Clinical appearance most consistent with mastitis, slowly resolving. Blood cultures negative to date. D/C home on doxycycline and Cipro.    Seen by surgery 03/27/12: Status post breast ultrasound 03/28/12: Findings consistent with cellulitis, no abscess.   Dr. Humphrey Rolls consulted, saw patient 03/27/12. She thinks findings are most consistent with cellulitis.  Active Problems:  HTN (hypertension)    Controlled on Nebivolol and Lasix. Lasix held x 2 days due to rising creatinine, creatinine now falling, OK to resume. Anxiety and depression   Continue Wellbutrin and fluvoxamine. H/O Breast cancer status post lumpectomy, radiation therapy   Not thought to be inflammatory breast cancer by Dr. Humphrey Rolls or Dr. Brantley Stage.   Continue Arimidex. AKI (acute kidney injury)   Creatinine elevation thought to be from Vancomycin. Vancomycin stopped 03/29/12. Creatinine improving on the date of discharge. Right shoulder pain   Patient requested orthopedic doctor be called. Notified and spoke with Jenetta Loges who kindly saw the patient 03/27/12. Outpatient work up recommended.  Medical Consultants:  Dr. [name], Oncology.  Dr. Armandina Gemma, Surgery  Jenetta Loges, PA-C, Orthopedic surgery (per patient request) Other Consultants:  Pharmacy  Discharge Exam: Filed Vitals:   03/30/12 0700  BP: 136/53  Pulse: 66  Temp: 98.1 F (36.7 C)  Resp: 18   Filed Vitals:   03/29/12 0521 03/29/12 1340 03/29/12 2120 03/30/12 0700  BP: 124/53 131/63 126/58 136/53  Pulse: 56 61 67 66  Temp: 97.3 F (36.3 C) 97.4 F (36.3 C) 98.4 F (36.9 C) 98.1 F (36.7 C)  TempSrc: Oral  Oral Oral  Resp: 16 18 18 18   Height:      Weight:      SpO2: 98% 94% 95% 90%    Gen: NAD  Cardiovascular: RRR, No M/R/G  Respiratory: Lungs CTAB  Breast: Left breast with marked erythema which is beginning to regress from inked margins, no nipple discharge. No change from yesterday's exam. No left axillary lymphadenopathy.  Gastrointestinal: Abdomen soft, NT/ND, + BS  Extremities: No C/E/C   Discharge Instructions  Discharge Orders    Future Appointments: Provider: Department: Dept Phone: Center:  04/01/2012 11:30 AM [facility] [phone] None   04/01/2012 12:00 PM Deatra Robinson, MD [place] [phone] None   04/05/2012  10:10 AM Marcello Moores A. Cornett, [facility], [place] [phone] None     Future Orders Please Complete By Expires   Diet general      Increase activity slowly      Call MD for:  temperature >100.4      Call MD for:      Scheduling Instructions:   Worsening diarrhea, worsening appearance of the breast.       Medication List     As of 03/30/2012 10:32 AM    STOP taking these medications         docusate sodium 100 MG capsule   Commonly known as: COLACE      polyethylene glycol packet   Commonly known as: MIRALAX / GLYCOLAX      TAKE these medications         ALPRAZolam 0.5 MG tablet   Commonly known as: XANAX   Take 1 tablet (0.5 mg total) by mouth 2 (two) times daily as needed for anxiety. For anxiety      anastrozole 1 MG tablet   Commonly known as: ARIMIDEX   Take 1 mg by mouth at bedtime.      buPROPion 300 MG 24 hr tablet   Commonly known as: WELLBUTRIN XL   Take 300 mg by mouth daily.      ciprofloxacin 500 MG tablet   Commonly known as: CIPRO   Take 1 tablet (500 mg total) by mouth 2 (two) times daily.      doxycycline 100 MG tablet   Commonly known as: VIBRA-TABS   Take 1 tablet (100 mg total) by mouth every 12 (twelve) hours.      fluvoxaMINE 50 MG tablet   Commonly known as: LUVOX   Take 50-100 mg by mouth at bedtime. 1 tab in the am, 2 tabs in the pm      furosemide 20 MG tablet   Commonly known as: LASIX   Take 2 tablets (40 mg total) by mouth 2 (two) times daily. Take two tablets once daily if needed for leg swelling.      hydrocortisone 2.5 % rectal cream   Commonly known as: ANUSOL-HC   Place rectally 3 (three) times daily as needed. Apply to rectum BID PRN rectal irritation from hemorrhoids.      nebivolol 10 MG tablet   Commonly known as: BYSTOLIC   Take 10 mg by mouth daily.      OXYCONTIN 60 MG T12a   Generic drug: OxyCODONE HCl ER   Take 60 mg by mouth every 8 (eight) hours. Dr approved to take an hour early if needed       Potassium Gluconate 595 MG Caps   Take 595 mg by mouth every morning.           Follow-up Information    Schedule an appointment as soon as possible for a visit with CORNETT,THOMAS A., MD. (Call for an appointment next week.)    Contact information:   [address] [place] [address] [phone]           The results of significant diagnostics from this hospitalization (including imaging, microbiology, ancillary and laboratory) are listed below for reference.    Significant Diagnostic Studies:  Dg Chest 2 View 03/24/2012  IMPRESSION: No  acute cardiopulmonary findings.   Original Report Authenticated By: Marijo Sanes, M.D.     US Breast Right / Mm Digital Diagnostic Unilat R 03/28/2012 IMPRESSION: Diffuse right breast skin and trabecular edema without drainable fluid collection or solid mass identified.  Differential considerations include post radiation change, cellulitis, or other inflammation.  Lymphangitic spread of tumor is considered less likely given the short interval since the initial treatment but is also possible. These results were called by telephone on 03/28/2012 at 2:45 p.m. to Dr. Brantley Stage, who verbally acknowledged these results.  RECOMMENDATION: Clinical follow-up, bilateral diagnostic mammography April 2014  I have discussed the findings and recommendations with the patient. Results were also provided in writing at the conclusion of the visit.  BI-RADS CATEGORY 2:  Benign finding(s).   Original Report Authenticated By: Conchita Paris, M.D.     Microbiology: Recent Results (from the past 240 hour(s))  CULTURE, BLOOD (ROUTINE X 2)     Status: Normal (Preliminary result)   Collection Time   03/24/12 12:22 PM      Component Value Range Status Comment   Specimen Description BLOOD LEFT AC   Final    Special Requests BOTTLES DRAWN AEROBIC AND ANAEROBIC 3 CC EACH   Final    Culture  Setup Time 03/24/2012 23:59   Final    Culture     Final    Value:         BLOOD CULTURE RECEIVED NO GROWTH TO DATE CULTURE WILL BE HELD FOR 5 DAYS BEFORE ISSUING A FINAL NEGATIVE REPORT   Report Status PENDING   Incomplete   CULTURE, BLOOD (ROUTINE X 2)     Status: Normal (Preliminary result)   Collection Time   03/24/12 12:50 PM      Component Value Range Status Comment   Specimen Description BLOOD LEFT FA   Final    Special Requests BOTTLES DRAWN AEROBIC AND ANAEROBIC 4 CC EACH   Final    Culture  Setup Time 03/24/2012 23:59   Final    Culture     Final    Value:        BLOOD CULTURE RECEIVED NO GROWTH TO DATE CULTURE WILL BE HELD FOR 5 DAYS BEFORE ISSUING A FINAL NEGATIVE REPORT   Report Status PENDING   Incomplete      Labs: Basic Metabolic Panel:  Lab [id number] 03/29/12 0503 03/28/12 0440 03/27/12 0625 03/26/12 0430  NA 141 141 138 140 139  K 3.6 4.1 4.2 4.0 2.7*  CL 107 103 98 102 101  CO2 29 29 29 29 31   GLUCOSE 101* 97 96 82 101*  BUN 18 23 18 17 16   CREATININE 1.62* 1.88* 1.59* 1.45* 1.24*  CALCIUM 9.4 9.7 10.1 9.8 9.7  MG -- -- -- -- --  PHOS -- -- -- -- --   Liver Function Tests:  Lab 03/25/12 0409 03/24/12 1222  AST 21 19  ALT 11 10  ALKPHOS 115 118*  BILITOT 0.5 0.7  PROT 7.2 7.1  ALBUMIN 3.7 3.8   CBC:  Lab 03/29/12 0503 03/28/12 0440 03/27/12 0625 03/25/12 0409 03/24/12 1222  WBC 4.8 5.2 5.1 8.6 8.2  NEUTROABS -- -- -- -- 6.0  HGB 11.7* 12.1 11.1* 13.0 12.6  HCT 35.7* 36.2 33.3* 39.3 37.5  MCV 89.5 88.5 88.3 87.5 86.6  PLT 256 263 259 235 202    Time coordinating discharge: 35 minutes.  Signed:  RAMA,CHRISTINA  Pager [phone] [facility] 03/30/2012, 10:32 AM

## 2012-04-01 ENCOUNTER — Other Ambulatory Visit: Payer: Self-pay | Admitting: *Deleted

## 2012-04-01 ENCOUNTER — Telehealth: Payer: Self-pay | Admitting: Oncology

## 2012-04-01 ENCOUNTER — Ambulatory Visit (HOSPITAL_BASED_OUTPATIENT_CLINIC_OR_DEPARTMENT_OTHER): Payer: PRIVATE HEALTH INSURANCE | Admitting: Oncology

## 2012-04-01 ENCOUNTER — Other Ambulatory Visit (HOSPITAL_BASED_OUTPATIENT_CLINIC_OR_DEPARTMENT_OTHER): Payer: PRIVATE HEALTH INSURANCE

## 2012-04-01 ENCOUNTER — Ambulatory Visit: Payer: PRIVATE HEALTH INSURANCE | Admitting: Oncology

## 2012-04-01 ENCOUNTER — Telehealth: Payer: Self-pay | Admitting: *Deleted

## 2012-04-01 ENCOUNTER — Other Ambulatory Visit: Payer: PRIVATE HEALTH INSURANCE

## 2012-04-01 ENCOUNTER — Encounter: Payer: Self-pay | Admitting: Oncology

## 2012-04-01 VITALS — BP 177/66 | HR 59 | Temp 98.4°F | Resp 20 | Ht 63.0 in | Wt 153.8 lb

## 2012-04-01 DIAGNOSIS — C50419 Malignant neoplasm of upper-outer quadrant of unspecified female breast: Secondary | ICD-10-CM

## 2012-04-01 DIAGNOSIS — C50919 Malignant neoplasm of unspecified site of unspecified female breast: Secondary | ICD-10-CM

## 2012-04-01 DIAGNOSIS — C182 Malignant neoplasm of ascending colon: Secondary | ICD-10-CM

## 2012-04-01 DIAGNOSIS — C189 Malignant neoplasm of colon, unspecified: Secondary | ICD-10-CM

## 2012-04-01 DIAGNOSIS — F411 Generalized anxiety disorder: Secondary | ICD-10-CM

## 2012-04-01 DIAGNOSIS — N61 Mastitis without abscess: Secondary | ICD-10-CM

## 2012-04-01 LAB — CBC WITH DIFFERENTIAL/PLATELET
Basophils Absolute: 0 10*3/uL (ref 0.0–0.1)
EOS%: 1 % (ref 0.0–7.0)
HCT: 38.4 % (ref 34.8–46.6)
HGB: 13.1 g/dL (ref 11.6–15.9)
LYMPH%: 27.8 % (ref 14.0–49.7)
MCH: 29.8 pg (ref 25.1–34.0)
MCV: 87.5 fL (ref 79.5–101.0)
MONO%: 9.1 % (ref 0.0–14.0)
NEUT%: 61.9 % (ref 38.4–76.8)
Platelets: 236 10*3/uL (ref 145–400)

## 2012-04-01 LAB — COMPREHENSIVE METABOLIC PANEL (CC13)
AST: 24 U/L (ref 5–34)
Alkaline Phosphatase: 103 U/L (ref 40–150)
BUN: 9 mg/dL (ref 7.0–26.0)
Creatinine: 1.4 mg/dL — ABNORMAL HIGH (ref 0.6–1.1)
Glucose: 87 mg/dl (ref 70–99)

## 2012-04-01 NOTE — Telephone Encounter (Signed)
This pt called to cancel and r/s her nov appts due to she has been in the hospital.

## 2012-04-01 NOTE — Telephone Encounter (Signed)
gve the pt her nov 2013 appt calendar

## 2012-04-01 NOTE — Progress Notes (Signed)
OFFICE PROGRESS NOTE  CC  REESE,[name], MD 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 At her final pathology revealed a low grade invasive ductal carcinoma with with 0 of 2 lymph nodes positive for metastatic disease. The tumor was ER positive PR positive HER-2/neu negative.  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 Post lumpectomy patient went on to receive radiation therapy from 12/28/2010 2 01/18/2011 to the right breast. She thereafter received single agent Arimidex 1 mg daily starting on 02/06/2011.  #2 05/01/2011 patient developed generalized weakness shortness of breath and syncopal episode. She had a CT of the head performed that was negative. She had CBC done with that showed a hemoglobin of 6.7. She received 3 units of packed red cells. She was severely iron deficient. FOB was positive. Patient had a GI consult performed during her hospitalization and on 1212 she underwent a colonoscopy that revealed a near obstructing colonic mass worrisome for a hepatic flexure tumor. She had biopsies performed that revealed an adenocarcinoma. She went on to have a right segmental resection performed on 05/05/2011. The final pathology revealed a 5.0 cm poorly differentiated high grade invasive adenocarcinoma with patchy  mucinous features invading through the muscularis propria into pericolonic fat he tissue 34 lymph nodes were negative for metastatic disease. (T3 N0)  CURRENT THERAPY:  1. Breast cancer: arimidex 2. Colon Cancer: observation  INTERVAL HISTORY: [name] 66 y.o. female returns for followup visit today. She was recently hospitalized last week for mastitis of the right breast. She required IV antibiotics including vancomycin and Zosyn. It took approximately 4 days to get a defect from the antibiotics. She was discharged on Friday of last week. She returns in followup. She was discharged home on doxycycline twice a day and Cipro twice a day. She has not started them yet she has the prescription and we'll started today. She has not had any fevers chills night sweats no shortness of breath no chest pains or palpitations no swelling in her legs. Remainder of the 10 point review of systems is negative.  MEDICAL HISTORY: Past Medical History  Diagnosis Date  . Hypertension   . Depression   . Back pain, chronic   . Breast cancer 09/09/10  . Cancer of colon 05/24/2011    ALLERGIES:   has no known allergies.  MEDICATIONS:  Current Outpatient Prescriptions  Medication Sig Dispense Refill  . ALPRAZolam (XANAX) 0.5 MG tablet Take 1 tablet (0.5 mg total) by mouth 2 (two) times daily as needed for anxiety. For anxiety  1 tablet  0  . anastrozole (ARIMIDEX) 1 MG tablet Take 1 mg by mouth at bedtime.        Marland Kitchen buPROPion (WELLBUTRIN XL) 300 MG 24 hr tablet Take 300 mg by mouth daily.        . ciprofloxacin (CIPRO) 500 MG tablet Take 1 tablet (500 mg total)  by mouth 2 (two) times daily.  14 tablet  0  . doxycycline (VIBRA-TABS) 100 MG tablet Take 1 tablet (100 mg total) by mouth every 12 (twelve) hours.  14 tablet  0  . fluvoxaMINE (LUVOX) 50 MG tablet Take 50-100 mg by mouth at bedtime. 1 tab in the am, 2 tabs in the pm       . furosemide (LASIX) 20 MG tablet Take 2 tablets (40 mg total) by mouth 2  (two) times daily. Take two tablets once daily if needed for leg swelling.  1 tablet  0  . hydrocortisone (ANUSOL-HC) 2.5 % rectal cream Place rectally 3 (three) times daily as needed. Apply to rectum BID PRN rectal irritation from hemorrhoids.  30 g  2  . nebivolol (BYSTOLIC) 10 MG tablet Take 10 mg by mouth daily.        . OxyCODONE HCl ER (OXYCONTIN) 60 MG T12A Take 60 mg by mouth every 8 (eight) hours. Dr approved to take an hour early if needed      . Potassium Gluconate 595 MG CAPS Take 595 mg by mouth every morning.        SURGICAL HISTORY:  Past Surgical History  Procedure Date  . Back surgery   . Shoulder surgery   . Tumor removal Tumor removed R breast  . Lumbar disc surgery   . Partial hysterectomy   . Colonoscopy 05/03/2011    Procedure: COLONOSCOPY;  Surgeon: Jeryl Columbia, MD;  Location: [facility] ENDOSCOPY;  Service: Endoscopy;  Laterality: N/A;  . Colon surgery 04/2011  . Breast lumpectomy 10/10/2010    Rt Breast  . Joint replacement     R&L total shoulder replacements    REVIEW OF SYSTEMS:  As in the interim history   PHYSICAL EXAMINATION: Patient is awake alert in no acute distress.   HEENT exam: EOMI PERRLA sclerae anicteric no conjunctival pallor oral mucosa is moist.  Neck: Supple no palpable cervical supraclavicular or axillary adenopathy.  Lungs are clear bilaterally to auscultation and percussion cardiovascular  Cardiovascular: Regular rate rhythm no murmurs gallops or rubs.  Abdomen is soft nontender nondistended bowel sounds are present well-healed surgical scar. There is no hepatosplenomegaly.  Extremities +2 edema there is noted to be some tenderness bilaterally in the calf.  Neuro patient's alert oriented x3 otherwise nonfocal. Patient does has considerable amount of anxiety  Right breast still remains erythematous but it is less swollen in comparison to my exam while she was hospitalized. There is no nipple discharge no inversion or retraction. Left  breast no masses or nipple discharge   ECOG PERFORMANCE STATUS: 1 - Symptomatic but completely ambulatory  Blood pressure 177/66, pulse 59, temperature 98.4 F (36.9 C), temperature source Oral, resp. rate 20, height 5\' 3"  (1.6 m), weight 153 lb 12.8 oz (69.763 kg).  LABORATORY DATA: Lab Results  Component Value Date   WBC 5.0 04/01/2012   HGB 13.1 04/01/2012   HCT 38.4 04/01/2012   MCV 87.5 04/01/2012   PLT 236 04/01/2012      Chemistry      Component Value Date/Time   NA 141 03/30/2012 0429   K 3.6 03/30/2012 0429   CL 107 03/30/2012 0429   CO2 29 03/30/2012 0429   BUN 18 03/30/2012 0429   CREATININE 1.62* 03/30/2012 0429      Component Value Date/Time   CALCIUM 9.4 03/30/2012 0429   ALKPHOS 115 03/25/2012 0409   AST 21 03/25/2012 0409   ALT 11 03/25/2012 0409  BILITOT 0.5 03/25/2012 0409       ASSESSMENT: 66 year old female with 2 primary malignancies:  1.  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  2.   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 Patient is seen in medical oncology for consideration of treatment options.   #3 right breast mastitis status post hospitalization now on oral antibiotics.   PLAN:  1. breast cancer: Patient will continue Arimidex 1 mg daily for a duration of 5 years.  #2 stage II adenocarcinoma of the colon: At this time no recommendations are made for adjuvant chemotherapy. Patient has a relatively good prognosis. The risks of treatments would far outweigh the benefits of adjuvant chemotherapy. All of this is discussed with the patient and her niece and they do understand.  #3 mastitis patient will continue Cipro and doxycycline she is scheduled to be on them for 7 days.  #4 I will see her back in one week's time on November 19 at 9 AM for a quick check up to make sure that her  breast is healing well and to make sure that you does not need extension up her antibiotics.  All questions were answered. The patient knows to call the clinic with any problems, questions or concerns. We can certainly see the patient much sooner if necessary.  I spent 25 minutes counseling the patient face to face. The total time spent in the appointment was 30 minutes.    [name], MD Medical/Oncology [facility] [phone] (beeper) [phone] (Office)

## 2012-04-01 NOTE — Telephone Encounter (Signed)
Message copied by Lucile Crater on Mon Apr 01, 2012  4:41 PM ------      Message from: Karenann Cai      Created: Mon Apr 01, 2012  4:21 PM       Is patient taking potassium?  If so can she double her dose?            If not, Kdur 20 meq po daily x 7 days      ----- Message -----         From: [facility] Interface         Sent: 04/01/2012  12:51 PM           To: Deatra Robinson, MD

## 2012-04-01 NOTE — Patient Instructions (Addendum)
Continue your antibiotic as prescribed.  I will see you back on 11/19 at 9:00 am

## 2012-04-01 NOTE — Telephone Encounter (Signed)
lmonvm advisng the pt of her revised appt d/t on 04/09/2012

## 2012-04-02 ENCOUNTER — Telehealth: Payer: Self-pay | Admitting: *Deleted

## 2012-04-02 NOTE — Telephone Encounter (Signed)
Called pt regarding next f/u appt. Pt voiced concerns " I'd like to see Dr. Humphrey Rolls for my next visit." Discussed with pt MD is not available at times pt requested, NP will f/u with pt. We will continue to try and work with pt to r/.s this appt for her to see MD if there is an opening on MD's schedule. We will call pt regarding any changes, however requested pt keep appt with NP. Pt advised the antibiotics gave her diarrhea and heaving. Recommended pt eat a little something prior to taking antibiotics as they can be hard on the stomach and sometimes causing n/v/d. Recommended oatmeal, peanut butter toast, glass of milk or small sandwich. Reviewed with MD who concurs this may help with side effects of the antibiotics. Encouraged pt to continue to eat small frequent meals and non-caffinated beverages. Pt verbalized understanding

## 2012-04-05 ENCOUNTER — Ambulatory Visit (INDEPENDENT_AMBULATORY_CARE_PROVIDER_SITE_OTHER): Payer: PRIVATE HEALTH INSURANCE | Admitting: Surgery

## 2012-04-05 ENCOUNTER — Encounter (INDEPENDENT_AMBULATORY_CARE_PROVIDER_SITE_OTHER): Payer: Self-pay | Admitting: Surgery

## 2012-04-05 VITALS — BP 146/88 | HR 60 | Temp 97.7°F | Resp 18 | Ht 63.0 in | Wt 153.0 lb

## 2012-04-05 DIAGNOSIS — N61 Mastitis without abscess: Secondary | ICD-10-CM | POA: Insufficient documentation

## 2012-04-05 NOTE — Progress Notes (Signed)
Subjective:     Patient ID: [name], female   DOB: 1945/06/29, 66 y.o.   MRN: [name]  HPI Patient returns in followup due to right breast mastitis. She was admitted to the hospital last week and discharged after receiving IV antibiotics. She has a sore breast but is improving and is taking doxycycline. Her fever or chills. The breast is still quite sore. She has a history of right breast lumpectomy with radiation therapy last year for breast cancer. No abscess was identified on  workup.  Review of Systems  Constitutional: Negative.   HENT: Negative.   Eyes: Negative.        Objective:   Physical Exam  Constitutional: She appears well-developed and well-nourished.  HENT:  Head: Normocephalic and atraumatic.  Pulmonary/Chest:         Assessment:     Right breast mastitis resolving    Plan:     [place] antibiotics.  Return 1 week.

## 2012-04-05 NOTE — Patient Instructions (Signed)
[place] antibiotics.  Return 1 week. Cancel oncology appointment.

## 2012-04-09 ENCOUNTER — Ambulatory Visit: Payer: PRIVATE HEALTH INSURANCE | Admitting: Adult Health

## 2012-04-09 ENCOUNTER — Other Ambulatory Visit: Payer: PRIVATE HEALTH INSURANCE | Admitting: Lab

## 2012-04-09 ENCOUNTER — Ambulatory Visit: Payer: PRIVATE HEALTH INSURANCE | Admitting: Oncology

## 2012-04-12 ENCOUNTER — Encounter (INDEPENDENT_AMBULATORY_CARE_PROVIDER_SITE_OTHER): Payer: Self-pay | Admitting: Surgery

## 2012-04-12 ENCOUNTER — Ambulatory Visit (INDEPENDENT_AMBULATORY_CARE_PROVIDER_SITE_OTHER): Payer: PRIVATE HEALTH INSURANCE | Admitting: Surgery

## 2012-04-12 VITALS — BP 130/80 | HR 60 | Temp 97.3°F | Resp 12 | Ht 63.0 in | Wt 151.8 lb

## 2012-04-12 DIAGNOSIS — N61 Mastitis without abscess: Secondary | ICD-10-CM

## 2012-04-12 MED ORDER — DOXYCYCLINE HYCLATE 100 MG PO TABS: 100.0000 mg | ORAL_TABLET | Freq: Two times a day (BID) | ORAL | Status: DC

## 2012-04-12 MED ORDER — PREDNISONE (PAK) 10 MG PO TABS: 10.0000 mg | ORAL_TABLET | Freq: Every day | ORAL | Status: DC

## 2012-04-12 MED ORDER — DIPHENHYDRAMINE HCL 50 MG PO TABS: 50.0000 mg | ORAL_TABLET | Freq: Four times a day (QID) | ORAL | Status: DC | PRN

## 2012-04-12 NOTE — Patient Instructions (Signed)
Apply benedryl to breast for itching as needed.  Can take benedryl  Tablets every 6 hours for itching as needed.  Prescriptions called to pharmacy.

## 2012-04-12 NOTE — Progress Notes (Signed)
Subjective:     Patient ID: [name], female   DOB: 01-15-1946, 66 y.o.   MRN: [name]  HPI Patient returns in followup due to right breast mastitis.  She has a sore breast but is improving and is taking doxycycline. Her fever or chills. The breast is still quite sore. She has a history of right breast lumpectomy with radiation therapy last year for breast cancer. No abscess was identified on  Workup.  Itching a big issue currently.  Review of Systems  Constitutional: Negative.   HENT: Negative.   Eyes: Negative.        Objective:   Physical Exam  Constitutional: She appears well-developed and well-nourished.  HENT:  Head: Normocephalic and atraumatic.  Pulmonary/Chest:   much less redness and induration right breast no mass or abscess.       Assessment:     Right breast mastitis resolving    Plan:     Slow improvement.  Itching a big issue for her with pain but less.  Continue ABX for 2 more weeks and try brief prednisone taper for the inflammation. LESS induration and redness on exam today.  Recommend benedryl as well.  Return 2 weeks

## 2012-04-26 ENCOUNTER — Ambulatory Visit (INDEPENDENT_AMBULATORY_CARE_PROVIDER_SITE_OTHER): Payer: PRIVATE HEALTH INSURANCE | Admitting: Surgery

## 2012-04-26 ENCOUNTER — Encounter (INDEPENDENT_AMBULATORY_CARE_PROVIDER_SITE_OTHER): Payer: Self-pay | Admitting: Surgery

## 2012-04-26 VITALS — BP 140/80 | HR 64 | Temp 97.3°F | Resp 14 | Ht 63.0 in | Wt 152.6 lb

## 2012-04-26 DIAGNOSIS — N61 Mastitis without abscess: Secondary | ICD-10-CM

## 2012-04-26 NOTE — Patient Instructions (Signed)
[name] internal medicine.  Stool sample

## 2012-04-26 NOTE — Progress Notes (Signed)
Subjective:     Patient ID: [name], female   DOB: 17-Mar-1946, 66 y.o.   MRN: [id number]  HPI Patient returns in followup due to right breast mastitis.  She has a sore breast but is improving and is taking doxycycline. Her fever or chills. The breast is still quite sore. She has a history of right breast lumpectomy with radiation therapy last year for breast cancer. No abscess was identified on  Workup.  Loose stools.  Stopped stool softner  Review of Systems  Constitutional: Negative.   HENT: Negative.   Eyes: Negative.        Objective:   Physical Exam  Constitutional: She appears well-developed and well-nourished.  HENT:  Head: Normocephalic and atraumatic.  Pulmonary/Chest:   much less redness and induration right breast no mass or abscess.       Assessment:     Right breast mastitis resolving Loose stools    Plan:     Slow improvement.  Itching and pain much better but having loose stools non bloody and no abdominal pain.  Return 4  Weeks.  c diff titer

## 2012-04-29 ENCOUNTER — Other Ambulatory Visit (INDEPENDENT_AMBULATORY_CARE_PROVIDER_SITE_OTHER): Payer: Self-pay

## 2012-04-29 ENCOUNTER — Telehealth (INDEPENDENT_AMBULATORY_CARE_PROVIDER_SITE_OTHER): Payer: Self-pay

## 2012-04-29 NOTE — Telephone Encounter (Signed)
Patient states Dr. Brantley Stage was going to give her a prescription for her loose stool issue but didn't because she thought she would be able to get something herself. She calls today stating she needs the prescription but doesn't remember what it is called. Told patient I will call her back after Cornett replies what he wants to prescribe her.

## 2012-04-29 NOTE — Telephone Encounter (Signed)
Cholestyramine 1 gram po with meals   # 30

## 2012-04-29 NOTE — Telephone Encounter (Signed)
Called into pharmacy. Pt aware

## 2012-05-01 ENCOUNTER — Other Ambulatory Visit: Payer: PRIVATE HEALTH INSURANCE | Admitting: Lab

## 2012-05-01 ENCOUNTER — Ambulatory Visit: Payer: PRIVATE HEALTH INSURANCE | Admitting: Oncology

## 2012-05-03 ENCOUNTER — Telehealth (INDEPENDENT_AMBULATORY_CARE_PROVIDER_SITE_OTHER): Payer: Self-pay

## 2012-05-03 NOTE — Telephone Encounter (Signed)
Pt returning call to [employer]. Per Michelle's request note reviewed with pt. Pt advised not cdiff and to use immodium. Pt to call with any concerns.

## 2012-05-03 NOTE — Telephone Encounter (Signed)
Called pt to let her no results. No c diff. She can use imodium. No answer. Left message to call back. Please relay message if you receive call back.

## 2012-05-06 NOTE — Telephone Encounter (Signed)
Pt called and was given test results.  She was also instructed to start the Imodium right away.  If no relief after 48 hrs, call back to speak to a nurse.  Pt understood.

## 2012-05-13 ENCOUNTER — Telehealth (INDEPENDENT_AMBULATORY_CARE_PROVIDER_SITE_OTHER): Payer: Self-pay | Admitting: General Surgery

## 2012-05-13 NOTE — Telephone Encounter (Signed)
Pt called to ask about diarrhea.  Her C.diff was negative.  Instructed pt to avoid all milk and milk products, avoid all fruit and fruit juices and to add foods high in fiber to help bulk her feces.  She can double her Immodium for now as well.  Pt understands and will try.

## 2012-05-31 ENCOUNTER — Telehealth (INDEPENDENT_AMBULATORY_CARE_PROVIDER_SITE_OTHER): Payer: Self-pay

## 2012-05-31 ENCOUNTER — Encounter (INDEPENDENT_AMBULATORY_CARE_PROVIDER_SITE_OTHER): Payer: PRIVATE HEALTH INSURANCE | Admitting: Surgery

## 2012-05-31 NOTE — Telephone Encounter (Signed)
Patient called to cancel appt since she is sick. She said diarrhea has resolved and sheis doing much better. She has upcoming MM and colonoscopy scheduled. She wil call back later to reschedule.

## 2012-10-25 ENCOUNTER — Other Ambulatory Visit (INDEPENDENT_AMBULATORY_CARE_PROVIDER_SITE_OTHER): Payer: Self-pay | Admitting: Surgery

## 2012-10-25 DIAGNOSIS — R928 Other abnormal and inconclusive findings on diagnostic imaging of breast: Secondary | ICD-10-CM

## 2012-11-04 ENCOUNTER — Ambulatory Visit (INDEPENDENT_AMBULATORY_CARE_PROVIDER_SITE_OTHER): Payer: PRIVATE HEALTH INSURANCE | Admitting: Surgery

## 2012-11-07 ENCOUNTER — Other Ambulatory Visit (INDEPENDENT_AMBULATORY_CARE_PROVIDER_SITE_OTHER): Payer: Self-pay | Admitting: Surgery

## 2012-11-07 ENCOUNTER — Ambulatory Visit
Admission: RE | Admit: 2012-11-07 | Discharge: 2012-11-07 | Disposition: A | Discharge status: Home / Self Care | Payer: PRIVATE HEALTH INSURANCE | Source: Ambulatory Visit | Attending: Radiation Oncology | Admitting: Radiation Oncology

## 2012-11-07 ENCOUNTER — Ambulatory Visit
Admission: RE | Admit: 2012-11-07 | Discharge: 2012-11-07 | Disposition: A | Discharge status: Home / Self Care | Payer: PRIVATE HEALTH INSURANCE | Source: Ambulatory Visit | Attending: Surgery | Admitting: Surgery

## 2012-11-07 DIAGNOSIS — R928 Other abnormal and inconclusive findings on diagnostic imaging of breast: Secondary | ICD-10-CM

## 2012-11-07 DIAGNOSIS — C50919 Malignant neoplasm of unspecified site of unspecified female breast: Secondary | ICD-10-CM

## 2012-11-20 ENCOUNTER — Other Ambulatory Visit: Payer: Self-pay | Admitting: Anesthesiology

## 2012-11-20 DIAGNOSIS — M545 Low back pain, unspecified: Secondary | ICD-10-CM

## 2012-11-28 ENCOUNTER — Encounter (HOSPITAL_COMMUNITY): Payer: Self-pay | Admitting: Emergency Medicine

## 2012-11-28 ENCOUNTER — Emergency Department (HOSPITAL_COMMUNITY)
Admission: EM | Admit: 2012-11-28 | Discharge: 2012-11-28 | Disposition: A | Discharge status: Home / Self Care | Payer: PRIVATE HEALTH INSURANCE | Attending: Emergency Medicine | Admitting: Emergency Medicine

## 2012-11-28 ENCOUNTER — Ambulatory Visit
Admission: RE | Admit: 2012-11-28 | Discharge: 2012-11-28 | Disposition: A | Discharge status: Home / Self Care | Payer: PRIVATE HEALTH INSURANCE | Source: Ambulatory Visit | Attending: Anesthesiology | Admitting: Anesthesiology

## 2012-11-28 DIAGNOSIS — I878 Other specified disorders of veins: Secondary | ICD-10-CM

## 2012-11-28 DIAGNOSIS — F3289 Other specified depressive episodes: Secondary | ICD-10-CM | POA: Insufficient documentation

## 2012-11-28 DIAGNOSIS — Z87891 Personal history of nicotine dependence: Secondary | ICD-10-CM | POA: Insufficient documentation

## 2012-11-28 DIAGNOSIS — Z79899 Other long term (current) drug therapy: Secondary | ICD-10-CM | POA: Insufficient documentation

## 2012-11-28 DIAGNOSIS — I10 Essential (primary) hypertension: Secondary | ICD-10-CM | POA: Insufficient documentation

## 2012-11-28 DIAGNOSIS — M545 Low back pain, unspecified: Secondary | ICD-10-CM

## 2012-11-28 DIAGNOSIS — G8929 Other chronic pain: Secondary | ICD-10-CM | POA: Insufficient documentation

## 2012-11-28 DIAGNOSIS — Z96619 Presence of unspecified artificial shoulder joint: Secondary | ICD-10-CM | POA: Insufficient documentation

## 2012-11-28 DIAGNOSIS — F329 Major depressive disorder, single episode, unspecified: Secondary | ICD-10-CM | POA: Insufficient documentation

## 2012-11-28 DIAGNOSIS — I872 Venous insufficiency (chronic) (peripheral): Secondary | ICD-10-CM | POA: Insufficient documentation

## 2012-11-28 DIAGNOSIS — Z853 Personal history of malignant neoplasm of breast: Secondary | ICD-10-CM | POA: Insufficient documentation

## 2012-11-28 DIAGNOSIS — Z85038 Personal history of other malignant neoplasm of large intestine: Secondary | ICD-10-CM | POA: Insufficient documentation

## 2012-11-28 LAB — CBC
Hemoglobin: 9.4 g/dL — ABNORMAL LOW (ref 12.0–15.0)
MCH: 29.4 pg (ref 26.0–34.0)
MCV: 89.1 fL (ref 78.0–100.0)
RBC: 3.2 MIL/uL — ABNORMAL LOW (ref 3.87–5.11)
WBC: 5.9 10*3/uL (ref 4.0–10.5)

## 2012-11-28 LAB — BASIC METABOLIC PANEL
CO2: 26 mEq/L (ref 19–32)
Calcium: 9.6 mg/dL (ref 8.4–10.5)
Chloride: 103 mEq/L (ref 96–112)
Creatinine, Ser: 1.53 mg/dL — ABNORMAL HIGH (ref 0.50–1.10)
Glucose, Bld: 77 mg/dL (ref 70–99)
Sodium: 139 mEq/L (ref 135–145)

## 2012-11-28 NOTE — ED Notes (Signed)
Pt complains of swelling to bilateral feet x 5 months. Pt complans of bleeding from veins on both of her feet.

## 2012-11-28 NOTE — ED Notes (Signed)
Ortho tech at bedside applying bilateral unna boots

## 2012-11-28 NOTE — ED Provider Notes (Signed)
History    CSN: [id number] Arrival date & time 11/28/12  1757  First MD Initiated Contact with Patient 11/28/12 1823     Chief Complaint  Patient presents with  . Foot Swelling   (Consider location/radiation/quality/duration/timing/severity/associated sxs/prior Treatment) HPI Comments: Pt is a 67yo female who presents to the ED with LE swelling and pain. Pt has a history of HTN, back pain s/p surgery x2, breast and colon CA s/p treatment. Pt reports her legs have been swollen for 5 months, with worsening swelling and pain for the past 2 months, particularly bad over the last few days. Pt went for an MRI today and was intolerant of holding still due to claustrophobia for which she took a Xanax and due to leg pain and jerking; MRI was not completed and pt came to the ED. Pt reports she has had intermittent leg jerking for a couple of weeks, and reports that she has had bleeding from veins in her legs on three episodes in the past two weeks; bleeding described as profuse and difficult to stop with pressure (pt painted nail polish over the spot yesterday). Pt takes Lasix for her leg swelling; pt states she has been taking only one pill twice per day (40 mg) but states she misunderstood the labelling and should have been taking 80 mg twice per day, which she is now taking since yesterday.   Pt has used compression stockings in the past but cannot put them on by herself and has been unable to wear them consistently. Pt reports that if she keeps her legs elevated and stays off her feet for a few days, the swelling goes down, but this has been worse over the past few months. Pt has been seen by a vein specialist in the past but reports she was seen only for one leg before finding out about her breast cancer and never followed up for the other leg. Pt has never had blood clots to her knowledge, but states that her feet get red when the pain gets particularly bad. Pt reports feta cheese as a trigger for  worsening edema.  The history is provided by the patient. No language interpreter was used.   Past Medical History  Diagnosis Date  . Hypertension   . Depression   . Back pain, chronic   . Breast cancer 09/09/10  . Cancer of colon 05/24/2011   Past Surgical History  Procedure Laterality Date  . Back surgery    . Shoulder surgery    . Tumor removal  Tumor removed R breast  . Lumbar disc surgery    . Partial hysterectomy    . Colonoscopy  05/03/2011    Procedure: COLONOSCOPY;  Surgeon: Jeryl Columbia, MD;  Location: [facility] ENDOSCOPY;  Service: Endoscopy;  Laterality: N/A;  . Colon surgery  04/2011  . Breast lumpectomy  10/10/2010    Rt Breast  . Joint replacement      R&L total shoulder replacements   Family History  Problem Relation Age of Onset  . Diabetes Mother   . Hypertension Mother   . Cancer Mother     leukemia  . Sudden death Mother   . Anesthesia problems Neg Hx   . Hypotension Neg Hx   . Malignant hyperthermia Neg Hx   . Pseudochol deficiency Neg Hx    History  Substance Use Topics  . Smoking status: Former Smoker -- 1.00 packs/day    Types: Cigarettes  . Smokeless tobacco: Never Used  . Alcohol Use:  No   OB History   Grav Para Term Preterm Abortions TAB SAB Ect Mult Living                 Review of Systems: Per HPI. Otherwise, full 12-system ROS was reviewed and all negative.  Allergies  Review of patient's allergies indicates no known allergies.  Home Medications   Current Outpatient Rx  Name  Route  Sig  Dispense  Refill  . acetaminophen (TYLENOL) 325 MG tablet   Oral   Take 650 mg by mouth as needed for pain (arthritis pain).         Marland Kitchen ALPRAZolam (XANAX) 0.5 MG tablet   Oral   Take 1 tablet (0.5 mg total) by mouth 2 (two) times daily as needed for anxiety. For anxiety   1 tablet   0   . anastrozole (ARIMIDEX) 1 MG tablet   Oral   Take 1 mg by mouth at bedtime.           Marland Kitchen buPROPion (WELLBUTRIN XL) 300 MG 24 hr tablet   Oral   Take  300 mg by mouth 2 (two) times daily.          Marland Kitchen docusate sodium (COLACE) 100 MG capsule   Oral   Take 200 mg by mouth at bedtime.         . fluvoxaMINE (LUVOX) 50 MG tablet   Oral   Take 50-100 mg by mouth at bedtime. 1 tab in the am, 2 tabs in the pm          . furosemide (LASIX) 40 MG tablet   Oral   Take 80 mg by mouth 2 (two) times daily.         . nebivolol (BYSTOLIC) 10 MG tablet   Oral   Take 10 mg by mouth daily.           . OxyCODONE HCl ER (OXYCONTIN) 60 MG T12A   Oral   Take 60 mg by mouth every 8 (eight) hours. Dr approved to take an hour early if needed         . Potassium Gluconate 595 MG CAPS   Oral   Take 595 mg by mouth 2 (two) times daily.          . psyllium (METAMUCIL) 58.6 % packet   Oral   Take 1 packet by mouth daily as needed (constipation).          BP 118/49  Pulse 88  Temp(Src) 97.7 F (36.5 C) (Oral)  Resp 16  SpO2 94% Physical Exam  Nursing note and vitals reviewed. Constitutional: She is oriented to person, place, and time. She appears well-developed and well-nourished. No distress.  HENT:  Head: Normocephalic and atraumatic.  Eyes: Conjunctivae are normal. Pupils are equal, round, and reactive to light.  Neck: Normal range of motion. Neck supple.  Cardiovascular: Normal rate, regular rhythm and normal heart sounds.   No murmur heard. Pulmonary/Chest: Effort normal and breath sounds normal. No respiratory distress. She has no wheezes. She has no rales.  Abdominal: Soft. Bowel sounds are normal. She exhibits no distension. There is no tenderness.  Musculoskeletal: Normal range of motion. She exhibits edema and tenderness.       Right ankle: She exhibits swelling.       Left ankle: She exhibits swelling.       Right lower leg: She exhibits tenderness and edema.       Left lower leg: She exhibits tenderness and  edema.       Right foot: She exhibits tenderness and swelling.       Left foot: She exhibits tenderness and  swelling.  4+ symmetric pitting edema from foot to knee bilaterally; very tender to palpation  Neurological: She is alert and oriented to person, place, and time. No cranial nerve deficit.  Skin: Skin is warm and dry. She is not diaphoretic. There is erythema.     Erythema to bilateral feet and around ankles, mild and symmetric, at baseline for "when her feet get this bad" per pt  Psychiatric: She has a normal mood and affect. Her behavior is normal.    ED Course  Procedures (including critical care time)  1915: Exam as above. Otherwise pt in no distress, though legs are very tender. Will check CBC and BMP given bleeding and to check kidney function with diuresis. Will likely place Unna Boot at least to right leg.  2020: CBC significant for Hb 9.4, but pt not symptomatic for anemia and not tachycardic. Discussed Unna boot for leg compression and gabapentin for neuropathic pain. Pt does not want to take gabapentin due to side effect of sedation/etc, but is agreeable to bilateral Unna boot and f/u with PCP for possible referral to vein specialist. Will await BMP for renal function for final dispo.  2045: Cr elevated at 1.53, with baseline appearing around 1.3-1.5.   Labs Reviewed  CBC - Abnormal; Notable for the following:    RBC 3.20 (*)    Hemoglobin 9.4 (*)    HCT 28.5 (*)    RDW 16.0 (*)    All other components within normal limits  BASIC METABOLIC PANEL - Abnormal; Notable for the following:    BUN 34 (*)    Creatinine, Ser 1.53 (*)    GFR calc non Af Amer 34 (*)    GFR calc Af Amer 40 (*)    All other components within normal limits   No results found. 1. Venous stasis of lower extremity     MDM  67yo female with chronic LE swelling most likely severe venous stasis/insufficiency as well as likely CKD that appears around baseline. Unna boot placed bilaterally. Advised continued diuresis per outpt Rx and advised close f/u with PCP and/or establishing with new PCP per pt  preference. Instructed pt to discuss with PCP about referral to vein/vascular specialist. Return precautions discussed.  The above was discussed in its entirety with attending ED physician Dr. Audie Pinto.   Emmaline Kluver, MD  PGY-2, [place], MD 11/28/12 [phone]

## 2012-11-28 NOTE — ED Provider Notes (Signed)
I saw and evaluated the patient, reviewed the resident's note and I agree with the findings and plan.   .Face to face Exam:  General:  Awake HEENT:  Atraumatic Resp:  Normal effort Abd:  Nondistended Neuro:No focal weakness    Dot Lanes, MD 11/28/12 2112

## 2012-11-28 NOTE — ED Notes (Signed)
Pt standing in room. Pt able to ambulate with steady gait. Pt states she has pain and swelling to bilateral lower extremities. Pt states that on Monday she had bleeding from a blood vessel on the top of her R foot. Pt states she was at MRI and couldn't complete the exam and came over to the ED.

## 2012-12-05 ENCOUNTER — Other Ambulatory Visit: Payer: Self-pay | Admitting: Anesthesiology

## 2012-12-05 DIAGNOSIS — M545 Low back pain, unspecified: Secondary | ICD-10-CM

## 2012-12-07 ENCOUNTER — Other Ambulatory Visit: Payer: PRIVATE HEALTH INSURANCE

## 2012-12-09 ENCOUNTER — Encounter (INDEPENDENT_AMBULATORY_CARE_PROVIDER_SITE_OTHER): Payer: Self-pay

## 2012-12-24 ENCOUNTER — Other Ambulatory Visit: Payer: Self-pay | Admitting: *Deleted

## 2012-12-24 DIAGNOSIS — C50919 Malignant neoplasm of unspecified site of unspecified female breast: Secondary | ICD-10-CM

## 2012-12-24 MED ORDER — ANASTROZOLE 1 MG PO TABS: 1.0000 mg | ORAL_TABLET | Freq: Every day | ORAL | Status: DC

## 2012-12-25 ENCOUNTER — Other Ambulatory Visit: Payer: PRIVATE HEALTH INSURANCE

## 2012-12-25 ENCOUNTER — Telehealth: Payer: Self-pay | Admitting: Oncology

## 2012-12-25 NOTE — Telephone Encounter (Signed)
LMONVM ADVISING THE PT OF HER AUG APPT. PT MISSED HER LAST F/U APPT.

## 2012-12-29 ENCOUNTER — Encounter (HOSPITAL_COMMUNITY): Payer: Self-pay

## 2012-12-29 ENCOUNTER — Emergency Department (HOSPITAL_COMMUNITY)
Admission: EM | Admit: 2012-12-29 | Discharge: 2012-12-29 | Disposition: A | Discharge status: Home / Self Care | Payer: PRIVATE HEALTH INSURANCE | Attending: Emergency Medicine | Admitting: Emergency Medicine

## 2012-12-29 DIAGNOSIS — Z85038 Personal history of other malignant neoplasm of large intestine: Secondary | ICD-10-CM | POA: Insufficient documentation

## 2012-12-29 DIAGNOSIS — S91309A Unspecified open wound, unspecified foot, initial encounter: Secondary | ICD-10-CM | POA: Insufficient documentation

## 2012-12-29 DIAGNOSIS — F3289 Other specified depressive episodes: Secondary | ICD-10-CM | POA: Insufficient documentation

## 2012-12-29 DIAGNOSIS — Y9289 Other specified places as the place of occurrence of the external cause: Secondary | ICD-10-CM | POA: Insufficient documentation

## 2012-12-29 DIAGNOSIS — F329 Major depressive disorder, single episode, unspecified: Secondary | ICD-10-CM | POA: Insufficient documentation

## 2012-12-29 DIAGNOSIS — W268XXA Contact with other sharp object(s), not elsewhere classified, initial encounter: Secondary | ICD-10-CM | POA: Insufficient documentation

## 2012-12-29 DIAGNOSIS — G8929 Other chronic pain: Secondary | ICD-10-CM | POA: Insufficient documentation

## 2012-12-29 DIAGNOSIS — Z853 Personal history of malignant neoplasm of breast: Secondary | ICD-10-CM | POA: Insufficient documentation

## 2012-12-29 DIAGNOSIS — S91311A Laceration without foreign body, right foot, initial encounter: Secondary | ICD-10-CM

## 2012-12-29 DIAGNOSIS — Z79899 Other long term (current) drug therapy: Secondary | ICD-10-CM | POA: Insufficient documentation

## 2012-12-29 DIAGNOSIS — Z87891 Personal history of nicotine dependence: Secondary | ICD-10-CM | POA: Insufficient documentation

## 2012-12-29 DIAGNOSIS — M549 Dorsalgia, unspecified: Secondary | ICD-10-CM | POA: Insufficient documentation

## 2012-12-29 DIAGNOSIS — Y9389 Activity, other specified: Secondary | ICD-10-CM | POA: Insufficient documentation

## 2012-12-29 DIAGNOSIS — I10 Essential (primary) hypertension: Secondary | ICD-10-CM | POA: Insufficient documentation

## 2012-12-29 NOTE — ED Notes (Addendum)
Applied by [employer] P to rt root

## 2012-12-29 NOTE — ED Notes (Addendum)
Per EMS- 1 inch laceration  To pt rt foot resulting from accident with garden shears. Bleeding controlled. Good CMS

## 2012-12-29 NOTE — ED Provider Notes (Signed)
History  This chart was scribed for non-physician practitioner, Glendell Docker FNP, working with Varney Biles, MD by Mikel Cella, ED Scribe. This patient was seen in room WTR8/WTR8 and the patient's care was started at 1820.  CSN: [id number]     Arrival date & time 12/29/12  1805  First MD Initiated Contact with Patient 12/29/12 1820     Chief Complaint  Patient presents with  . Laceration    Patient is a 67 y.o. female presenting with skin laceration. The history is provided by the patient and the EMS personnel. No language interpreter was used.  Laceration Location:  Foot Foot laceration location:  R foot and top of R foot Depth:  Cutaneous Bleeding: controlled   Time since incident:  1 hour Laceration mechanism:  Metal edge Pain details:    Severity:  Mild   Timing:  Constant   Progression:  Unchanged Foreign body present:  No foreign bodies Relieved by:  None tried Worsened by:  Nothing tried Ineffective treatments:  None tried Tetanus status:  Out of date   HPI Comments: [name] is a 67 y.o. female brought in by ambulance, who presents to the Emergency Department complaining of a laceration to the right foot that occurred 1 hour ago. Pt states she was using garden sheers when she dropped the sheers and cut her foot. She states her tetanus vaccination is out of date. She states she is able to move her foot and bear weight. She denies taking any blood thinners.  She states she has had a similar incident in the past. She denies any other injuries or complaints at this time.  Past Medical History  Diagnosis Date  . Hypertension   . Depression   . Back pain, chronic   . Breast cancer 09/09/10  . Cancer of colon 05/24/2011   Past Surgical History  Procedure Laterality Date  . Back surgery    . Shoulder surgery    . Tumor removal  Tumor removed R breast  . Lumbar disc surgery    . Partial hysterectomy    . Colonoscopy  05/03/2011    Procedure:  COLONOSCOPY;  Surgeon: Jeryl Columbia, MD;  Location: [facility] ENDOSCOPY;  Service: Endoscopy;  Laterality: N/A;  . Colon surgery  04/2011  . Breast lumpectomy  10/10/2010    Rt Breast  . Joint replacement      R&L total shoulder replacements   Family History  Problem Relation Age of Onset  . Diabetes Mother   . Hypertension Mother   . Cancer Mother     leukemia  . Sudden death Mother   . Anesthesia problems Neg Hx   . Hypotension Neg Hx   . Malignant hyperthermia Neg Hx   . Pseudochol deficiency Neg Hx    History  Substance Use Topics  . Smoking status: Former Smoker -- 1.00 packs/day    Types: Cigarettes  . Smokeless tobacco: Never Used  . Alcohol Use: No   No OB history available.   Review of Systems  Skin: Positive for wound.  All other systems reviewed and are negative.    Allergies  Review of patient's allergies indicates no known allergies.  Home Medications   Current Outpatient Rx  Name  Route  Sig  Dispense  Refill  . ALPRAZolam (XANAX) 0.5 MG tablet   Oral   Take 1 tablet (0.5 mg total) by mouth 2 (two) times daily as needed for anxiety. For anxiety   1 tablet   0   .  anastrozole (ARIMIDEX) 1 MG tablet   Oral   Take 1 tablet (1 mg total) by mouth at bedtime.   90 tablet   3   . buPROPion (WELLBUTRIN XL) 300 MG 24 hr tablet   Oral   Take 300 mg by mouth 2 (two) times daily.          Marland Kitchen docusate sodium (COLACE) 100 MG capsule   Oral   Take 200 mg by mouth at bedtime.         . fluvoxaMINE (LUVOX) 50 MG tablet   Oral   Take 50-100 mg by mouth at bedtime. 1 tab in the am, 2 tabs in the pm          . furosemide (LASIX) 40 MG tablet   Oral   Take 80 mg by mouth 2 (two) times daily.         . nebivolol (BYSTOLIC) 10 MG tablet   Oral   Take 10 mg by mouth daily.           . OxyCODONE HCl ER (OXYCONTIN) 60 MG T12A   Oral   Take 60 mg by mouth every 8 (eight) hours. Dr approved to take an hour early if needed         . polyethylene  glycol (MIRALAX / GLYCOLAX) packet   Oral   Take 17 g by mouth daily.         . Potassium Gluconate 595 MG CAPS   Oral   Take 595 mg by mouth 2 (two) times daily.           SpO2 97%  Physical Exam  Nursing note and vitals reviewed. Constitutional: She is oriented to person, place, and time. She appears well-developed and well-nourished. No distress.  HENT:  Head: Normocephalic and atraumatic.  Eyes: EOM are normal. Pupils are equal, round, and reactive to light.  Neck: Normal range of motion. Neck supple. No tracheal deviation present.  Cardiovascular: Normal rate and intact distal pulses.   Pulmonary/Chest: Effort normal. No respiratory distress.  Abdominal: Soft. She exhibits no distension.  Musculoskeletal: Normal range of motion. She exhibits no edema.  Neurological: She is alert and oriented to person, place, and time.  Skin: Skin is warm and dry.  0.5 cm laceration to top of right foot   Psychiatric: She has a normal mood and affect. Her behavior is normal.    ED Course   LACERATION REPAIR Date/Time: 12/29/2012 7:33 PM Performed by: Glendell Docker Authorized by: Glendell Docker Risks and benefits: risks, benefits and alternatives were discussed Consent given by: patient Patient identity confirmed: verbally with patient Time out: Immediately prior to procedure a "time out" was called to verify the correct patient, procedure, equipment, support staff and site/side marked as required. Body area: lower extremity Location details: right foot Laceration length: 0.5 cm Foreign bodies: no foreign bodies Irrigation solution: saline Skin closure: glue and Steri-Strips Approximation: close Approximation difficulty: simple Patient tolerance: Patient tolerated the procedure well with no immediate complications.   (including critical care time)  DIAGNOSTIC STUDIES: Oxygen Saturation is 97% on room air, adequate by my interpretation.    COORDINATION OF  CARE:  6:34 PM-Discussed treatment plan which includes laceration repair with pt at bedside and pt agreed to plan.   7:06 PM: Pt treated with dermabond and steri-strips    Labs Reviewed - No data to display No results found. 1. Laceration of foot, right, initial encounter     MDM  Wound repaired bleeding  has stopped  I personally performed the services described in this documentation, which was scribed in my presence. The recorded information has been reviewed and is accurate.    Glendell Docker, NP 12/29/12 2015

## 2013-01-02 NOTE — ED Provider Notes (Signed)
Medical screening examination/treatment/procedure(s) were performed by non-physician practitioner and as supervising physician I was immediately available for consultation/collaboration.  Varney Biles, MD 01/02/13 [phone]

## 2013-01-06 ENCOUNTER — Ambulatory Visit: Payer: PRIVATE HEALTH INSURANCE | Admitting: Adult Health

## 2013-01-06 ENCOUNTER — Telehealth: Payer: Self-pay | Admitting: Medical Oncology

## 2013-01-06 NOTE — Telephone Encounter (Signed)
Patient called stating she has been out of town and had a message on her VM telling her of an appt today with L. Sheppard Coil, NP. Patient states she isn't sure why she has this appt. States she was seen by Dr Brantley Stage re right breast mastitis and that has since resolved. States she had a mammogram and was told it was normal. Informed patient will contact her if MD/NP wants pt to f/u with a yearly appt. Pt expressed thanks, no further questions at this time and requests to cancel today's appt with NP @ 1:45. Requests next appt to be sched with MD.  NP informed of cancellation. Mssg forwarded to MD/NP

## 2013-01-20 ENCOUNTER — Emergency Department (HOSPITAL_COMMUNITY): Payer: PRIVATE HEALTH INSURANCE

## 2013-01-20 ENCOUNTER — Inpatient Hospital Stay (HOSPITAL_COMMUNITY)
Admission: EM | Admit: 2013-01-20 | Discharge: 2013-01-22 | DRG: 812 | Disposition: A | Discharge status: Home Health | Payer: PRIVATE HEALTH INSURANCE | Attending: Internal Medicine | Admitting: Internal Medicine

## 2013-01-20 DIAGNOSIS — Z720 Tobacco use: Secondary | ICD-10-CM

## 2013-01-20 DIAGNOSIS — C189 Malignant neoplasm of colon, unspecified: Secondary | ICD-10-CM | POA: Diagnosis present

## 2013-01-20 DIAGNOSIS — R296 Repeated falls: Secondary | ICD-10-CM

## 2013-01-20 DIAGNOSIS — S52599A Other fractures of lower end of unspecified radius, initial encounter for closed fracture: Secondary | ICD-10-CM | POA: Diagnosis present

## 2013-01-20 DIAGNOSIS — Z9049 Acquired absence of other specified parts of digestive tract: Secondary | ICD-10-CM

## 2013-01-20 DIAGNOSIS — E876 Hypokalemia: Secondary | ICD-10-CM | POA: Diagnosis present

## 2013-01-20 DIAGNOSIS — F329 Major depressive disorder, single episode, unspecified: Secondary | ICD-10-CM

## 2013-01-20 DIAGNOSIS — W19XXXA Unspecified fall, initial encounter: Secondary | ICD-10-CM

## 2013-01-20 DIAGNOSIS — C50919 Malignant neoplasm of unspecified site of unspecified female breast: Secondary | ICD-10-CM

## 2013-01-20 DIAGNOSIS — C50911 Malignant neoplasm of unspecified site of right female breast: Secondary | ICD-10-CM | POA: Diagnosis present

## 2013-01-20 DIAGNOSIS — I10 Essential (primary) hypertension: Secondary | ICD-10-CM | POA: Diagnosis present

## 2013-01-20 DIAGNOSIS — G8929 Other chronic pain: Secondary | ICD-10-CM | POA: Diagnosis present

## 2013-01-20 DIAGNOSIS — N179 Acute kidney failure, unspecified: Secondary | ICD-10-CM | POA: Diagnosis present

## 2013-01-20 DIAGNOSIS — R531 Weakness: Secondary | ICD-10-CM | POA: Diagnosis present

## 2013-01-20 DIAGNOSIS — K922 Gastrointestinal hemorrhage, unspecified: Secondary | ICD-10-CM | POA: Diagnosis present

## 2013-01-20 DIAGNOSIS — F32A Depression, unspecified: Secondary | ICD-10-CM

## 2013-01-20 DIAGNOSIS — I5032 Chronic diastolic (congestive) heart failure: Secondary | ICD-10-CM | POA: Diagnosis present

## 2013-01-20 DIAGNOSIS — D649 Anemia, unspecified: Secondary | ICD-10-CM

## 2013-01-20 DIAGNOSIS — Z9181 History of falling: Secondary | ICD-10-CM

## 2013-01-20 DIAGNOSIS — D509 Iron deficiency anemia, unspecified: Principal | ICD-10-CM | POA: Diagnosis present

## 2013-01-20 DIAGNOSIS — D62 Acute posthemorrhagic anemia: Secondary | ICD-10-CM | POA: Diagnosis present

## 2013-01-20 DIAGNOSIS — I509 Heart failure, unspecified: Secondary | ICD-10-CM | POA: Diagnosis present

## 2013-01-20 DIAGNOSIS — Z923 Personal history of irradiation: Secondary | ICD-10-CM

## 2013-01-20 DIAGNOSIS — E871 Hypo-osmolality and hyponatremia: Secondary | ICD-10-CM | POA: Diagnosis present

## 2013-01-20 DIAGNOSIS — N61 Mastitis without abscess: Secondary | ICD-10-CM

## 2013-01-20 LAB — CBC WITH DIFFERENTIAL/PLATELET
Basophils Absolute: 0 10*3/uL (ref 0.0–0.1)
Basophils Relative: 1 % (ref 0–1)
Hemoglobin: 7.2 g/dL — ABNORMAL LOW (ref 12.0–15.0)
Lymphocytes Relative: 28 % (ref 12–46)
MCHC: 30.6 g/dL (ref 30.0–36.0)
Monocytes Relative: 7 % (ref 3–12)
Neutro Abs: 3.4 10*3/uL (ref 1.7–7.7)
Neutrophils Relative %: 64 % (ref 43–77)
RDW: 16.7 % — ABNORMAL HIGH (ref 11.5–15.5)
WBC: 5.3 10*3/uL (ref 4.0–10.5)

## 2013-01-20 LAB — IRON AND TIBC
Iron: 12 ug/dL — ABNORMAL LOW (ref 42–135)
Saturation Ratios: 2 % — ABNORMAL LOW (ref 20–55)
TIBC: 496 ug/dL — ABNORMAL HIGH (ref 250–470)
UIBC: 484 ug/dL — ABNORMAL HIGH (ref 125–400)

## 2013-01-20 LAB — MAGNESIUM: Magnesium: 1.9 mg/dL (ref 1.5–2.5)

## 2013-01-20 LAB — COMPREHENSIVE METABOLIC PANEL
AST: 23 U/L (ref 0–37)
Albumin: 3.8 g/dL (ref 3.5–5.2)
Alkaline Phosphatase: 100 U/L (ref 39–117)
Chloride: 99 mEq/L (ref 96–112)
Potassium: 3.4 mEq/L — ABNORMAL LOW (ref 3.5–5.1)
Total Bilirubin: 0.4 mg/dL (ref 0.3–1.2)

## 2013-01-20 LAB — RETICULOCYTES
RBC.: 3.13 MIL/uL — ABNORMAL LOW (ref 3.87–5.11)
Retic Count, Absolute: 50.1 10*3/uL (ref 19.0–186.0)

## 2013-01-20 LAB — POCT I-STAT TROPONIN I: Troponin i, poc: 0.02 ng/mL (ref 0.00–0.08)

## 2013-01-20 LAB — ABO/RH: ABO/RH(D): O NEG

## 2013-01-20 LAB — PREPARE RBC (CROSSMATCH)

## 2013-01-20 MED ORDER — POTASSIUM CHLORIDE CRYS ER 20 MEQ PO TBCR
40.0000 meq | EXTENDED_RELEASE_TABLET | Freq: Once | ORAL | Status: AC
  Administered 2013-01-20: 40 meq via ORAL
  Filled 2013-01-20: qty 2

## 2013-01-20 MED ORDER — ONDANSETRON HCL 4 MG/2ML IJ SOLN: 4.0000 mg | Freq: Four times a day (QID) | INTRAMUSCULAR | Status: DC | PRN

## 2013-01-20 MED ORDER — FLUVOXAMINE MALEATE 50 MG PO TABS
50.0000 mg | ORAL_TABLET | Freq: Two times a day (BID) | ORAL | Status: DC
  Administered 2013-01-21: 50 mg via ORAL
  Filled 2013-01-20 (×3): qty 1

## 2013-01-20 MED ORDER — HYDROMORPHONE HCL PF 1 MG/ML IJ SOLN
0.5000 mg | INTRAMUSCULAR | Status: DC | PRN
  Administered 2013-01-21: 0.5 mg via INTRAVENOUS
  Filled 2013-01-20: qty 1

## 2013-01-20 MED ORDER — POLYETHYLENE GLYCOL 3350 17 G PO PACK
17.0000 g | PACK | Freq: Every day | ORAL | Status: DC | PRN
  Filled 2013-01-20: qty 1

## 2013-01-20 MED ORDER — ALPRAZOLAM 0.5 MG PO TABS
0.5000 mg | ORAL_TABLET | Freq: Two times a day (BID) | ORAL | Status: DC | PRN
  Administered 2013-01-21 (×2): 0.5 mg via ORAL
  Filled 2013-01-20 (×3): qty 1

## 2013-01-20 MED ORDER — ANASTROZOLE 1 MG PO TABS
1.0000 mg | ORAL_TABLET | Freq: Every day | ORAL | Status: DC
Start: 2013-01-20 — End: 2013-01-22
  Administered 2013-01-20 – 2013-01-21 (×2): 1 mg via ORAL
  Filled 2013-01-20 (×3): qty 1

## 2013-01-20 MED ORDER — DOCUSATE SODIUM 100 MG PO CAPS
200.0000 mg | ORAL_CAPSULE | Freq: Every day | ORAL | Status: DC
  Administered 2013-01-20: 200 mg via ORAL
  Filled 2013-01-20 (×3): qty 2

## 2013-01-20 MED ORDER — SODIUM CHLORIDE 0.9 % IJ SOLN
3.0000 mL | Freq: Two times a day (BID) | INTRAMUSCULAR | Status: DC
  Administered 2013-01-20 – 2013-01-22 (×3): 3 mL via INTRAVENOUS

## 2013-01-20 MED ORDER — SODIUM CHLORIDE 0.9 % IV SOLN
INTRAVENOUS | Status: AC
  Administered 2013-01-21: 07:00:00 via INTRAVENOUS

## 2013-01-20 MED ORDER — NEBIVOLOL HCL 10 MG PO TABS
10.0000 mg | ORAL_TABLET | Freq: Every morning | ORAL | Status: DC
  Administered 2013-01-21 – 2013-01-22 (×2): 10 mg via ORAL
  Filled 2013-01-20 (×2): qty 1

## 2013-01-20 MED ORDER — BUPROPION HCL ER (XL) 300 MG PO TB24
300.0000 mg | ORAL_TABLET | Freq: Every morning | ORAL | Status: DC
  Administered 2013-01-21 – 2013-01-22 (×2): 300 mg via ORAL
  Filled 2013-01-20 (×2): qty 1

## 2013-01-20 MED ORDER — OXYCODONE HCL ER 20 MG PO T12A
60.0000 mg | EXTENDED_RELEASE_TABLET | Freq: Three times a day (TID) | ORAL | Status: DC
Start: 2013-01-20 — End: 2013-01-21
  Administered 2013-01-20 – 2013-01-21 (×2): 60 mg via ORAL
  Filled 2013-01-20 (×2): qty 3

## 2013-01-20 MED ORDER — FLUVOXAMINE MALEATE 100 MG PO TABS
100.0000 mg | ORAL_TABLET | Freq: Every day | ORAL | Status: DC
  Administered 2013-01-20 – 2013-01-21 (×2): 100 mg via ORAL
  Filled 2013-01-20 (×3): qty 1

## 2013-01-20 MED ORDER — ONDANSETRON HCL 4 MG PO TABS: 4.0000 mg | ORAL_TABLET | Freq: Four times a day (QID) | ORAL | Status: DC | PRN

## 2013-01-20 NOTE — ED Notes (Signed)
Pt from home and has lt wrist pain, pt fell and has been having increased in falls, brusing possible deformity to wrist, 169mcg fen iv 20g rt wrist hand

## 2013-01-20 NOTE — ED Provider Notes (Signed)
CSN: [id number]     Arrival date & time 01/20/13  1716 History   First MD Initiated Contact with Patient 01/20/13 1735     Chief Complaint  Patient presents with  . Wrist Pain   (Consider location/radiation/quality/duration/timing/severity/associated sxs/prior Treatment) HPI Comments: Patient presents emergency department with chief complaints of left wrist pain. She states that she fell today while working in the kitchen. Patient states that she has been having increasingly frequent falls. She denies loss of consciousness. He states that she does not sleep well, and she attributes her falls to lack of sleep. Denies chest pain, but does endorse shortness of breath which is been more frequent. She denies any fevers, chills, nausea, vomiting, diarrhea, constipation, unusual vaginal discharge, or dysuria.  The history is provided by the patient. No language interpreter was used.    Past Medical History  Diagnosis Date  . Hypertension   . Depression   . Back pain, chronic   . Breast cancer 09/09/10  . Cancer of colon 05/24/2011   Past Surgical History  Procedure Laterality Date  . Back surgery    . Shoulder surgery    . Tumor removal  Tumor removed R breast  . Lumbar disc surgery    . Partial hysterectomy    . Colonoscopy  05/03/2011    Procedure: COLONOSCOPY;  Surgeon: Jeryl Columbia, MD;  Location: [facility] ENDOSCOPY;  Service: Endoscopy;  Laterality: N/A;  . Colon surgery  04/2011  . Breast lumpectomy  10/10/2010    Rt Breast  . Joint replacement      R&L total shoulder replacements   Family History  Problem Relation Age of Onset  . Diabetes Mother   . Hypertension Mother   . Cancer Mother     leukemia  . Sudden death Mother   . Anesthesia problems Neg Hx   . Hypotension Neg Hx   . Malignant hyperthermia Neg Hx   . Pseudochol deficiency Neg Hx    History  Substance Use Topics  . Smoking status: Former Smoker -- 1.00 packs/day    Types: Cigarettes  . Smokeless tobacco: Never  Used  . Alcohol Use: No   OB History   Grav Para Term Preterm Abortions TAB SAB Ect Mult Living                 Review of Systems  All other systems reviewed and are negative.    Allergies  Review of patient's allergies indicates no known allergies.  Home Medications   Current Outpatient Rx  Name  Route  Sig  Dispense  Refill  . ALPRAZolam (XANAX) 0.5 MG tablet   Oral   Take 1 tablet (0.5 mg total) by mouth 2 (two) times daily as needed for anxiety. For anxiety   1 tablet   0   . anastrozole (ARIMIDEX) 1 MG tablet   Oral   Take 1 tablet (1 mg total) by mouth at bedtime.   90 tablet   3   . buPROPion (WELLBUTRIN XL) 300 MG 24 hr tablet   Oral   Take 300 mg by mouth 2 (two) times daily.          Marland Kitchen docusate sodium (COLACE) 100 MG capsule   Oral   Take 200 mg by mouth at bedtime.         . fluvoxaMINE (LUVOX) 50 MG tablet   Oral   Take 50-100 mg by mouth at bedtime. 1 tab in the am, 2 tabs in the pm          .  furosemide (LASIX) 40 MG tablet   Oral   Take 80 mg by mouth 2 (two) times daily.         . nebivolol (BYSTOLIC) 10 MG tablet   Oral   Take 10 mg by mouth daily.           . OxyCODONE HCl ER (OXYCONTIN) 60 MG T12A   Oral   Take 60 mg by mouth every 8 (eight) hours. Dr approved to take an hour early if needed         . polyethylene glycol (MIRALAX / GLYCOLAX) packet   Oral   Take 17 g by mouth daily.         . Potassium Gluconate 595 MG CAPS   Oral   Take 595 mg by mouth 2 (two) times daily.           BP 116/66  Pulse 67  Resp 18  SpO2 95% Physical Exam  Nursing note and vitals reviewed. Constitutional: She is oriented to person, place, and time. She appears well-developed and well-nourished.  HENT:  Head: Normocephalic and atraumatic.  Eyes: Conjunctivae and EOM are normal. Pupils are equal, round, and reactive to light.  Neck: Normal range of motion. Neck supple.  Cardiovascular: Normal rate and regular rhythm.  Exam  reveals no gallop and no friction rub.   No murmur heard. Distal pulses intact  Pulmonary/Chest: Effort normal and breath sounds normal. No respiratory distress. She has no wheezes. She has no rales. She exhibits no tenderness.  Abdominal: Soft. Bowel sounds are normal. She exhibits no distension and no mass. There is no tenderness. There is no rebound and no guarding.  Musculoskeletal: Normal range of motion. She exhibits no edema and no tenderness.  Left wrist tender to palpation, moderately swollen, range of motion and strength deferred secondary to pain  Neurological: She is alert and oriented to person, place, and time.  Skin: Skin is warm and dry.  Psychiatric: She has a normal mood and affect. Her behavior is normal. Judgment and thought content normal.    ED Course  Procedures (including critical care time) Labs Review Labs Reviewed  CBC WITH DIFFERENTIAL  COMPREHENSIVE METABOLIC PANEL  ED ECG REPORT  I personally interpreted this EKG   Date: 01/20/2013   Rate: 63  Rhythm: normal sinus rhythm  QRS Axis: normal  Intervals: normal  ST/T Wave abnormalities: nonspecific T wave changes  Conduction Disutrbances:none  Narrative Interpretation:   Old EKG Reviewed: unchanged   Results for orders placed during the hospital encounter of 01/20/13  CBC WITH DIFFERENTIAL      Result Value Range   WBC 5.3  4.0 - 10.5 K/uL   RBC 3.13 (*) 3.87 - 5.11 MIL/uL   Hemoglobin 7.2 (*) 12.0 - 15.0 g/dL   HCT 23.5 (*) 36.0 - 46.0 %   MCV 75.1 (*) 78.0 - 100.0 fL   MCH 23.0 (*) 26.0 - 34.0 pg   MCHC 30.6  30.0 - 36.0 g/dL   RDW 16.7 (*) 11.5 - 15.5 %   Platelets 161  150 - 400 K/uL   Neutrophils Relative % 64  43 - 77 %   Neutro Abs 3.4  1.7 - 7.7 K/uL   Lymphocytes Relative 28  12 - 46 %   Lymphs Abs 1.5  0.7 - 4.0 K/uL   Monocytes Relative 7  3 - 12 %   Monocytes Absolute 0.4  0.1 - 1.0 K/uL   Eosinophils Relative 1  0 - 5 %  Eosinophils Absolute 0.0  0.0 - 0.7 K/uL   Basophils  Relative 1  0 - 1 %   Basophils Absolute 0.0  0.0 - 0.1 K/uL  COMPREHENSIVE METABOLIC PANEL      Result Value Range   Sodium 134 (*) 135 - 145 mEq/L   Potassium 3.4 (*) 3.5 - 5.1 mEq/L   Chloride 99  96 - 112 mEq/L   CO2 24  19 - 32 mEq/L   Glucose, Bld 87  70 - 99 mg/dL   BUN 52 (*) 6 - 23 mg/dL   Creatinine, Ser 1.80 (*) 0.50 - 1.10 mg/dL   Calcium 9.7  8.4 - 10.5 mg/dL   Total Protein 7.1  6.0 - 8.3 g/dL   Albumin 3.8  3.5 - 5.2 g/dL   AST 23  0 - 37 U/L   ALT 13  0 - 35 U/L   Alkaline Phosphatase 100  39 - 117 U/L   Total Bilirubin 0.4  0.3 - 1.2 mg/dL   GFR calc non Af Amer 28 (*) >90 mL/min   GFR calc Af Amer 33 (*) >90 mL/min  RETICULOCYTES      Result Value Range   Retic Ct Pct 1.6  0.4 - 3.1 %   RBC. 3.13 (*) 3.87 - 5.11 MIL/uL   Retic Count, Manual 50.1  19.0 - 186.0 K/uL  POCT I-STAT TROPONIN I      Result Value Range   Troponin i, poc 0.02  0.00 - 0.08 ng/mL   Comment 3           OCCULT BLOOD, POC DEVICE      Result Value Range   Fecal Occult Bld NEGATIVE  NEGATIVE  TYPE AND SCREEN      Result Value Range   ABO/RH(D) O NEG     Antibody Screen NEG     Sample Expiration 01/23/2013     Dg Wrist Complete Left  01/20/2013   *RADIOLOGY REPORT*  Clinical Data: Fall with left wrist pain  LEFT WRIST - COMPLETE 3+ VIEW  Comparison: None  Findings: A nondisplaced T-shaped fracture of the distal radius extends to the radiocarpal joint. There is no evidence of subluxation or dislocation. Degenerative changes at the first carpometacarpal joint noted. No other focal bony abnormalities are present.  IMPRESSION: Nondisplaced intra-articular distal radial fracture.   Original Report Authenticated By: Margarette Canada, M.D.   Dg Hand Complete Left  01/20/2013   CLINICAL DATA:  Fall, left hand pain.  EXAM: LEFT HAND - COMPLETE 3+ VIEW  COMPARISON:  01/20/2013  FINDINGS: Mild degenerative changes in the IP joints and 1st carpometacarpal joint. The previously seen distal radial fracture is  again noted, but better visualized on the wrist series No additional acute bony abnormality.  IMPRESSION: Distal left radial fracture, better seen on the wrist series. No additional acute bony abnormality.   Electronically Signed   By: Rolm Baptise   On: 01/20/2013 18:33      MDM   1. Anemia   2. Fall, initial encounter     Patient with left wrist pain following a fall today. Imaging reveals distal radius fracture, will splint, and recommend orthopedic followup. No neurovascular compromise, and intact distal pulses.   Will also check basic labs, and EKG, as the patient has been having increasingly frequent falls.  7:50 PM Patient discussed with Dr. Roderic Palau, who recommends admission.    Patient discussed with Dr. Olen Pel, who will place admission orders.    Montine Circle, PA-C  01/20/13 1950 

## 2013-01-20 NOTE — ED Provider Notes (Signed)
Medical screening examination/treatment/procedure(s) were performed by non-physician practitioner and as supervising physician I was immediately available for consultation/collaboration.   Maudry Diego, MD 01/20/13 2157

## 2013-01-20 NOTE — H&P (Signed)
[facility] History and Physical  [name] [id number] DOB: Jul 17, 1945 DOA: 01/20/2013  Referring physician: ED physician PCP: Kristine Garbe, MD   Chief Complaint: falls, generalized weakness   HPI:  Pt is 67 yo female with history of right breat cancer diagnosed in 2012, status post lumpectomy and radiation therapy, under Dr. Laurelyn Sickle care (currently on Anastrozole), history of colon cancer and status post right hemicolectomy by Dr. Molli Posey in 04/2013 (diagnosed after colonoscopy done by Dr. Watt Climes 04/2011 and showed near complete obstruction by colon cancer at the hepatic flexure), recently had biopsy of transverse descending colon polyp (11/2012) positive for tubular adenoma, now presents to [facility] ED with main concern of progressively worsening generalized weakness, several episodes of sudden falls, poor oral intake. She explains these symptoms started several months ago but this AM, prior to this admission, she fell and landed on her left wrist, now has constant and throbbing pain in the left wrist, radiating to the left arm, no specific alleviating factors, aggravated by even minimal movement. She denies any specific focal neurological symptoms, no numbness or tingling. Pt currently denies chest pain or shortness of breath, no specific abdominal or urinary concerns, no fevers, no chills, no palpitations, no blood in stool or urine.   In ED, pt has had BMP and CBC checked and findings consistent with acute blood loss anemia, Hg 7.2 (last Hg in 11/2012 = 9.4), FOBT checked in ED negative, Cr 1.8 and last Cr 11/2012 = 1.53, left wrist xray with distal radial fracture. [facility] asked to admit for further evaluation and management of complex medical problems, telemetry bed requested due to repeated falls at home.   Assessment and Plan:  Principal Problem:   Weakness generalized - this appears to be multifactorial and secondary to acute blood loss anemia, progressive deconditioning, ? Medication  effect (pt is on several antidepressants, xanax, narcotics) - will admit to telemetry bed for further evaluation - will provide supportive care with analgesia as needed - IVF, gentle hydration, PT evaluation - check orthostatic vitals, TSH Active Problems:   Anemia, acute blood loss, microcytic  - unclear etiology, worrisome for ? Malignancy - last Hg 9.4 (11/2012) - FOBT in ED negative, repeat Hg now and again CBC in AM - will plan on transfusing 2 units of PRBC - please consult GI in AM (pt had colonoscopy done in the past by Dr. Watt Climes)   Falls frequently - possibly related to acute blood loss anemia, ? medication side effects as noted above - PT evaluation, orthostatic vitals need to be checked as well   ARF (acute renal failure) - last Cr 1.53 in 11/2012, this new elevation likely pre renal etiology secondary to acute blood loss anemia, lasix effect - will hold Lasix for now due to soft BP, worsening Cr - gentle hydration with NS and repeat BMP in AM   Hypokalemia - most likely secondary to Lasix, pt is on potassium supplement at home - supplement here, check Mg level - repeat BMP in AM   Left wrist radial fracture - splint ordered, OT evaluation ordered   Diastolic CHF, grade I, chronic - on Lasix at home, but will hold here due to worsening creatinine and soft SBP in low 100's - daily weights, strict I's and O's monitoring   HTN (hypertension) - SBP in low 100's on admission - hold Lasix but will continue Bystolic    Hyponatremia - likely pre renal, IVF as noted above and repeat BMP in AM   H/O  Breast cancer status post lumpectomy, radiation therapy - follows with Dr. Humphrey Rolls, will forward admission note to Dr. Humphrey Rolls and will plan on notifying in AM as well - continue Anastrozole   Code Status: Full Family Communication: Pt at bedside Disposition Plan: Admit to telemetry bed   Review of Systems:  Constitutional: Negative for fever, chills. Negative for diaphoresis.   HENT: Negative for hearing loss, ear pain, nosebleeds, congestion, sore throat, neck pain, tinnitus and ear discharge.   Eyes: Negative for blurred vision, double vision, photophobia, pain, discharge and redness.  Respiratory: Negative for cough, hemoptysis, sputum production, wheezing and stridor.   Cardiovascular: Negative for chest pain, palpitations, orthopnea, claudication  Gastrointestinal: Negative for nausea, vomiting and abdominal pain. Negative for heartburn.  Genitourinary: Negative for dysuria, urgency, frequency, hematuria and flank pain.  Musculoskeletal: Negative for myalgias, back pain.  Skin: Negative for itching and rash.  Neurological: Negative for tingling, tremors, sensory change, speech change, focal weakness, loss of consciousness and headaches.  Endo/Heme/Allergies: Negative for environmental allergies and polydipsia. Does not bruise/bleed easily.  Psychiatric/Behavioral: Negative for suicidal ideas. The patient is not nervous/anxious.      Past Medical History  Diagnosis Date  . Hypertension   . Depression   . Back pain, chronic   . Breast cancer 09/09/10  . Cancer of colon 05/24/2011    Past Surgical History  Procedure Laterality Date  . Back surgery    . Shoulder surgery    . Tumor removal  Tumor removed R breast  . Lumbar disc surgery    . Partial hysterectomy    . Colonoscopy  05/03/2011    Procedure: COLONOSCOPY;  Surgeon: Jeryl Columbia, MD;  Location: [facility] ENDOSCOPY;  Service: Endoscopy;  Laterality: N/A;  . Colon surgery  04/2011  . Breast lumpectomy  10/10/2010    Rt Breast  . Joint replacement      R&L total shoulder replacements    Social History:  reports that she has quit smoking. Her smoking use included Cigarettes. She smoked 1.00 pack per day. She has never used smokeless tobacco. She reports that she does not drink alcohol or use illicit drugs.  No Known Allergies  Family History  Problem Relation Age of Onset  . Diabetes Mother   .  Hypertension Mother   . Cancer Mother     leukemia  . Sudden death Mother   . Anesthesia problems Neg Hx   . Hypotension Neg Hx   . Malignant hyperthermia Neg Hx   . Pseudochol deficiency Neg Hx     Prior to Admission medications   Medication Sig Start Date End Date Taking? Authorizing Provider  ALPRAZolam Duanne Moron) 0.5 MG tablet Take 0.5 mg by mouth 2 (two) times daily as needed for anxiety.   Yes Historical Provider, MD  anastrozole (ARIMIDEX) 1 MG tablet Take 1 tablet (1 mg total) by mouth at bedtime. 12/24/12  Yes Deatra Robinson, MD  buPROPion (WELLBUTRIN XL) 300 MG 24 hr tablet Take 300 mg by mouth every morning.    Yes Historical Provider, MD  docusate sodium (COLACE) 100 MG capsule Take 200 mg by mouth at bedtime.   Yes Historical Provider, MD  fluvoxaMINE (LUVOX) 50 MG tablet Take 50-100 mg by mouth 2 (two) times daily. She takes one tablet in the morning and two tablets in the evening.   Yes Historical Provider, MD  furosemide (LASIX) 40 MG tablet Take 80 mg by mouth 2 (two) times daily.   Yes Historical Provider, MD  nebivolol (BYSTOLIC) 10 MG tablet Take 10 mg by mouth every morning.    Yes Historical Provider, MD  OxyCODONE HCl ER (OXYCONTIN) 60 MG T12A Take 60 mg by mouth every 8 (eight) hours.    Yes Historical Provider, MD  polyethylene glycol (MIRALAX / GLYCOLAX) packet Take 17 g by mouth daily as needed (For constipation.).    Yes Historical Provider, MD  Potassium Gluconate 595 MG CAPS Take 595 mg by mouth 2 (two) times daily.    Yes Historical Provider, MD    Physical Exam: Filed Vitals:   01/20/13 1734  BP: 116/66  Pulse: 67  Resp: 18  SpO2: 95%    Physical Exam  Constitutional: Appears well-developed and well-nourished. No distress.  HENT: Normocephalic. External right and left ear normal. Oropharynx is clear and moist.  Eyes: Conjunctivae and EOM are normal. PERRLA, no scleral icterus.  Neck: Normal ROM. Neck supple. No JVD. No tracheal deviation. No  thyromegaly.  CVS: RRR, S1/S2 +, no murmurs, no gallops, no carotid bruit.  Pulmonary: Effort and breath sounds normal, no stridor, rhonchi, wheezes, rales.  Abdominal: Soft. BS +,  no distension, tenderness, rebound or guarding.  Musculoskeletal: Normal range of motion. Tenderness to palpation in the left wrist area, difficulty with ROM examination. Lymphadenopathy: No lymphadenopathy noted, cervical, inguinal. Neuro: Alert. Normal reflexes, muscle tone coordination. No cranial nerve deficit. Skin: Skin is warm and dry. No rash noted. Not diaphoretic. No erythema. No pallor.  Psychiatric: Normal mood and affect. Behavior, judgment, thought content normal.   Labs on Admission:  Basic Metabolic Panel:  Recent Labs Lab 01/20/13 1800  NA 134*  K 3.4*  CL 99  CO2 24  GLUCOSE 87  BUN 52*  CREATININE 1.80*  CALCIUM 9.7   Liver Function Tests:  Recent Labs Lab 01/20/13 1800  AST 23  ALT 13  ALKPHOS 100  BILITOT 0.4  PROT 7.1  ALBUMIN 3.8   CBC:  Recent Labs Lab 01/20/13 1754  WBC 5.3  NEUTROABS 3.4  HGB 7.2*  HCT 23.5*  MCV 75.1*  PLT 161   Radiological Exams on Admission: Dg Wrist Complete Left  01/20/2013   Nondisplaced intra-articular distal radial fracture.    Dg Hand Complete Left  01/20/2013  Distal left radial fracture, better seen on the wrist series. No additional acute bony abnormality.     EKG: Normal sinus rhythm, no ST/T wave changes  Faye Ramsay, MD  [facility] Pager [phone]  If 7PM-7AM, please contact night-coverage [email or web address] Password [facility] 01/20/2013, 7:53 PM

## 2013-01-21 ENCOUNTER — Encounter (HOSPITAL_COMMUNITY): Payer: Self-pay

## 2013-01-21 DIAGNOSIS — E871 Hypo-osmolality and hyponatremia: Secondary | ICD-10-CM

## 2013-01-21 DIAGNOSIS — F17200 Nicotine dependence, unspecified, uncomplicated: Secondary | ICD-10-CM

## 2013-01-21 DIAGNOSIS — C50919 Malignant neoplasm of unspecified site of unspecified female breast: Secondary | ICD-10-CM

## 2013-01-21 DIAGNOSIS — F341 Dysthymic disorder: Secondary | ICD-10-CM

## 2013-01-21 LAB — BASIC METABOLIC PANEL
Calcium: 9.5 mg/dL (ref 8.4–10.5)
GFR calc non Af Amer: 37 mL/min — ABNORMAL LOW (ref >90)
Glucose, Bld: 97 mg/dL (ref 70–99)
Potassium: 3.3 mEq/L — ABNORMAL LOW (ref 3.5–5.1)
Sodium: 139 mEq/L (ref 135–145)

## 2013-01-21 LAB — CBC
Hemoglobin: 9 g/dL — ABNORMAL LOW (ref 12.0–15.0)
Platelets: 151 10*3/uL (ref 150–400)
RBC: 3.64 MIL/uL — ABNORMAL LOW (ref 3.87–5.11)
WBC: 4.1 10*3/uL (ref 4.0–10.5)

## 2013-01-21 LAB — VITAMIN B12: Vitamin B-12: 740 pg/mL (ref 211–911)

## 2013-01-21 LAB — TSH: TSH: 0.551 u[IU]/mL (ref 0.350–4.500)

## 2013-01-21 MED ORDER — OXYCODONE HCL ER 40 MG PO T12A
40.0000 mg | EXTENDED_RELEASE_TABLET | Freq: Three times a day (TID) | ORAL | Status: DC
  Administered 2013-01-21 – 2013-01-22 (×2): 40 mg via ORAL
  Filled 2013-01-21 (×3): qty 1

## 2013-01-21 MED ORDER — FUROSEMIDE 10 MG/ML IJ SOLN
40.0000 mg | Freq: Two times a day (BID) | INTRAMUSCULAR | Status: AC
  Administered 2013-01-21 – 2013-01-22 (×2): 40 mg via INTRAVENOUS
  Filled 2013-01-21 (×2): qty 4

## 2013-01-21 MED ORDER — SODIUM CHLORIDE 0.9 % IV SOLN
1020.0000 mg | Freq: Once | INTRAVENOUS | Status: AC
  Administered 2013-01-21: 1020 mg via INTRAVENOUS
  Filled 2013-01-21: qty 34

## 2013-01-21 MED ORDER — POTASSIUM CHLORIDE CRYS ER 20 MEQ PO TBCR
40.0000 meq | EXTENDED_RELEASE_TABLET | Freq: Two times a day (BID) | ORAL | Status: AC
  Administered 2013-01-21 (×2): 40 meq via ORAL
  Filled 2013-01-21 (×2): qty 2

## 2013-01-21 MED ORDER — ACETAMINOPHEN 325 MG PO TABS
650.0000 mg | ORAL_TABLET | Freq: Four times a day (QID) | ORAL | Status: DC | PRN
  Administered 2013-01-21: 650 mg via ORAL
  Filled 2013-01-21: qty 2

## 2013-01-21 MED ORDER — ALPRAZOLAM 0.5 MG PO TABS
0.5000 mg | ORAL_TABLET | Freq: Three times a day (TID) | ORAL | Status: DC | PRN
  Administered 2013-01-21 – 2013-01-22 (×2): 0.5 mg via ORAL
  Filled 2013-01-21: qty 1

## 2013-01-21 MED ORDER — FLUVOXAMINE MALEATE 50 MG PO TABS
50.0000 mg | ORAL_TABLET | Freq: Every day | ORAL | Status: DC
Start: 2013-01-22 — End: 2013-01-22
  Administered 2013-01-22: 50 mg via ORAL
  Filled 2013-01-21: qty 1

## 2013-01-21 NOTE — Evaluation (Signed)
Physical Therapy Evaluation Patient Details Name: [name] MRN: [name] DOB: [birth year] Today's Date: 01/21/2013 Time: [id number] PT Time Calculation (min): 27 min  PT Assessment / Plan / Recommendation History of Present Illness  67 y.o. female with h/o breast cancer s/p lumpectomy and radiation, colon cancer s/p hemicolectomy admitted with weakness, multiple falls, L distal radius fx, ARF, hypokalemia.   Clinical Impression  *Min/guard assist for transfers and ambulation. Pt walked 50' without assistive device, no LOB. SNF recommended if balance issues persist (pt lives alone), otherwise HHPT. Pt prefers to go home. She would benefit from acute PT to maximize safety and independence with mobility.  **    PT Assessment  Patient needs continued PT services    Follow Up Recommendations  SNF (pt prefers HHPT, wants to be home with dog)    Does the patient have the potential to tolerate intense rehabilitation      Barriers to Discharge        Equipment Recommendations  Cane    Recommendations for Other Services OT consult   Frequency Min 3X/week    Precautions / Restrictions Precautions Precautions: Fall Precaution Comments: LUE splinted Restrictions Weight Bearing Restrictions: Yes Other Position/Activity Restrictions: NWB LUE (Simultaneous filing. User may not have seen previous data.)   Pertinent Vitals/Pain *LUE unrated Premedicated, repositioned**      Mobility  Bed Mobility Bed Mobility: Not assessed Transfers Transfers: Sit to Stand;Stand to Sit Sit to Stand: 4: Min guard;From toilet Stand to Sit: 4: Min guard;To toilet;To chair/3-in-1 Details for Transfer Assistance: VCs hand placement, min/guard for safety 2* h/o falls Ambulation/Gait Ambulation/Gait Assistance: 4: Min guard Ambulation Distance (Feet): 50 Feet Assistive device: None Gait Pattern: Decreased step length - right;Decreased step length - left;Trunk flexed General Gait Details: forward  head, min/guard for safety due to h/o falls, no LOB on eval    Exercises     PT Diagnosis: Acute pain;Generalized weakness  PT Problem List: Decreased activity tolerance;Decreased mobility;Pain PT Treatment Interventions: Gait training;Stair training;Functional mobility training;Therapeutic exercise;Therapeutic activities;Patient/family education     PT Goals(Current goals can be found in the care plan section) Acute Rehab PT Goals Patient Stated Goal: to be home with her dog, stop falling PT Goal Formulation: With patient Time For Goal Achievement: 02/04/13 Potential to Achieve Goals: Good  Visit Information  Last PT Received On: 01/21/13 Assistance Needed: +1 History of Present Illness: 67 y.o. female with h/o breast cancer s/p lumpectomy and radiation, colon cancer s/p hemicolectomy admitted with weakness, multiple falls, L distal radius fx, ARF, hypokalemia.        Prior [place] expects to be discharged to:: Private residence Living Arrangements: Alone Type of Home: House Home Access: Stairs to enter Technical brewer of Steps: 7 Entrance Stairs-Rails: Can reach both;Left;Right Home Layout: One level Home Equipment: None;Grab bars - tub/shower;Grab bars - toilet Prior Function Level of Independence: Independent Comments: multiple falls PTA, tub and shower with grab bar Communication Communication: No difficulties    Cognition  Cognition Arousal/Alertness: Awake/alert Overall Cognitive Status: Within Functional Limits for tasks assessed    Extremity/Trunk Assessment Upper Extremity Assessment Upper Extremity Assessment: Defer to OT evaluation Lower Extremity Assessment Lower Extremity Assessment: Overall WFL for tasks assessed Cervical / Trunk Assessment Cervical / Trunk Assessment: Kyphotic (significant forward head posture)   Balance    End of Session PT - End of Session Equipment Utilized During Treatment: Gait  belt Activity Tolerance: Patient tolerated treatment well Patient left: in chair;with call bell/phone  within reach;with chair alarm set Nurse Communication: Mobility status  GP     [name] 01/21/2013, 9:31 AM [phone]

## 2013-01-21 NOTE — Progress Notes (Addendum)
[facility] PROGRESS NOTE  [name] [id number] DOB: Jun 17, 1945 DOA: 01/20/2013 PCP: Kristine Garbe, MD  Assessment/Plan: Active Problems:  Anemia, Iron deficiency - h/o Stage 2 colon CA s/p hemicolectomy 12/12 - Hb was 11-13 range till 11/13 - Anemia panel with severe Iron deficiency - FOBT in ED negative - s/p 2 units of PRBC overnight - will request [facility] consult  Addendum: d/w Dr.Hayes, [facility], she had FU colonoscopy in 7/14 which showed  a few polyps and otherwise normal, hence repeat studies not indicated -no upper GI symptoms, will screen for Celiac and start IV Iron  Weakness generalized  - this appears to be multifactorial and secondary to  anemia, progressive deconditioning, Polypharmacy- will admit to telemetry bed for further evaluation  - check TSH - PT   Stage 2 colon CA S/p Hemicolectomy 12/12 On surveillance   ARF (acute renal failure)  - last Cr 1.53 in 11/2012 -creatinine back at baseline, but volume overloaded, diurese with LV lasix today  Hypokalemia -from diuretics, replace  Left wrist radial fracture  - splint ordered, OT evaluation ordered  -Fu with Ortho  Diastolic CHF, grade I, chronic  - resume lasix IV today   HTN (hypertension)  -resume bystolic  Chronic pain -on high dose MS Contin at home, due to increased lethargy will cut down dose to 40mg  Q8 -will need weaning, per PCP  H/O Stage 1 Breast cancer status post lumpectomy, radiation therapy  - continue Anastrozole, followed by Dr.Khan   Code Status: Full  Family Communication: Pt at bedside  Disposition Plan: Admit to telemetry bed    Consultants:  [facility]  HPI/Subjective: Still feels tired, no h/o overt blood loss  Objective: Filed Vitals:   01/21/13 0600  BP: 120/59  Pulse: 63  Temp: 98.3 F (36.8 C)  Resp: 16    Intake/Output Summary (Last 24 hours) at 01/21/13 0959 Last data filed at 01/21/13 0541  Gross per 24 hour  Intake     25 ml   Output    400 ml  Net   -375 ml   Filed Weights   01/20/13 2100  Weight: 65.363 kg (144 lb 1.6 oz)    Exam:   General:  Sleepy, arousible, oriented x3  Cardiovascular: S1S2/RRR  Respiratory: CTAB  Abdomen: soft, Nt, BS present  Musculoskeletal:2 plus edema    Data Reviewed: Basic Metabolic Panel:  Recent Labs Lab 01/20/13 1800 01/20/13 2115 01/21/13 0820  NA 134*  --  139  K 3.4*  --  3.3*  CL 99  --  105  CO2 24  --  24  GLUCOSE 87  --  97  BUN 52*  --  38*  CREATININE 1.80*  --  1.43*  CALCIUM 9.7  --  9.5  MG  --  1.9  --    Liver Function Tests:  Recent Labs Lab 01/20/13 1800  AST 23  ALT 13  ALKPHOS 100  BILITOT 0.4  PROT 7.1  ALBUMIN 3.8   No results found for this basename: LIPASE, AMYLASE,  in the last 168 hours No results found for this basename: AMMONIA,  in the last 168 hours CBC:  Recent Labs Lab 01/20/13 1754 01/20/13 2115 01/21/13 0820  WBC 5.3  --  4.1  NEUTROABS 3.4  --   --   HGB 7.2* 7.3* 9.0*  HCT 23.5*  --  27.9*  MCV 75.1*  --  76.6*  PLT 161  --  151   Cardiac Enzymes:  No results found for this basename: CKTOTAL, CKMB, CKMBINDEX, TROPONINI,  in the last 168 hours BNP (last 3 results) No results found for this basename: PROBNP,  in the last 8760 hours CBG: No results found for this basename: GLUCAP,  in the last 168 hours  No results found for this or any previous visit (from the past 240 hour(s)).   Studies: Dg Wrist Complete Left  2013-02-05   *RADIOLOGY REPORT*  Clinical Data: Fall with left wrist pain  LEFT WRIST - COMPLETE 3+ VIEW  Comparison: None  Findings: A nondisplaced T-shaped fracture of the distal radius extends to the radiocarpal joint. There is no evidence of subluxation or dislocation. Degenerative changes at the first carpometacarpal joint noted. No other focal bony abnormalities are present.  IMPRESSION: Nondisplaced intra-articular distal radial fracture.   Original Report Authenticated By: Margarette Canada, M.D.   Dg Hand Complete Left  05-Feb-2013   CLINICAL DATA:  Fall, left hand pain.  EXAM: LEFT HAND - COMPLETE 3+ VIEW  COMPARISON:  02-05-13  FINDINGS: Mild degenerative changes in the IP joints and 1st carpometacarpal joint. The previously seen distal radial fracture is again noted, but better visualized on the wrist series No additional acute bony abnormality.  IMPRESSION: Distal left radial fracture, better seen on the wrist series. No additional acute bony abnormality.   Electronically Signed   By: Rolm Baptise   On: 02/05/2013 18:33    Scheduled Meds: . anastrozole  1 mg Oral QHS  . buPROPion  300 mg Oral q morning - 10a  . docusate sodium  200 mg Oral QHS  . fluvoxaMINE  100 mg Oral QHS  . fluvoxaMINE  50 mg Oral BID  . nebivolol  10 mg Oral q morning - 10a  . OxyCODONE  60 mg Oral Q8H  . sodium chloride  3 mL Intravenous Q12H   Continuous Infusions:   Principal Problem:   Weakness generalized Active Problems:   Anemia   Falls frequently   GI bleed   Hypokalemia   HTN (hypertension)   H/O Breast cancer status post lumpectomy, radiation therapy   Hyponatremia   ARF (acute renal failure)    Time spent: 23min    [facility] Pager [phone]. If 7PM-7AM, please contact night-coverage at [email or web address], password [facility] 01/21/2013, 9:59 AM  LOS: 1 day

## 2013-01-21 NOTE — Evaluation (Signed)
Occupational Therapy Evaluation Patient Details Name: [name] MRN: [name] DOB: 04/06/1946 Today's Date: 01/21/2013 Time: [id number] OT Time Calculation (min): 24 min  OT Assessment / Plan / Recommendation History of present illness 67 y.o. female with h/o breast cancer s/p lumpectomy and radiation, colon cancer s/p hemicolectomy admitted with weakness, multiple falls, L distal radius fx, ARF, hypokalemia.    Clinical Impression   Pt was admitted for generalized weakness and multiple falls.  She has a distal L radius fx.  Pt will benefit from skilled OT to increase safety and independence with adls.  Goals in acute are for supervision level.  Pt was independent prior to admission.      OT Assessment  Patient needs continued OT Services    Follow Up Recommendations  SNF (vs HHOT, pt prefers HH--depending upon progress)    Barriers to Discharge      Equipment Recommendations   (to be further assessed, ?shower seat/bench)    Recommendations for Other Services    Frequency  Min 2X/week    Precautions / Restrictions Precautions Precautions: Fall Precaution Comments: LUE splinted Restrictions Weight Bearing Restrictions: Yes LUE Weight Bearing: Non weight bearing Other Position/Activity Restrictions: NWB LUE (Simultaneous filing. User may not have seen previous data.)   Pertinent Vitals/Pain Pain L lateral scapula with arm movement and all over when we left her.  Not rated.  Pt was premedicated and repositioned in chair.    ADL  Grooming: Supervision/safety;Wash/dry hands Where Assessed - Grooming: Unsupported standing Upper Body Bathing: Minimal assistance Where Assessed - Upper Body Bathing: Unsupported sitting Lower Body Bathing: Minimal assistance Where Assessed - Lower Body Bathing: Unsupported sit to stand Upper Body Dressing: Minimal assistance Where Assessed - Upper Body Dressing: Unsupported sitting Lower Body Dressing: Minimal assistance Where Assessed -  Lower Body Dressing: Supported sit to stand Toilet Transfer: Magazine features editor Method: Sit to Loss adjuster, chartered: Comfort height toilet Toileting - Clothing Manipulation and Hygiene: Set up Where Assessed - Best boy and Hygiene: Sit on 3-in-1 or toilet Equipment Used: Gait belt Transfers/Ambulation Related to ADLs: pt ambulated to bathroom and out to hall with min guard.  No LOB ADL Comments: Min A for ADLs--LUE immobilized    OT Diagnosis: Generalized weakness  OT Problem List: Decreased strength;Decreased activity tolerance;Impaired balance (sitting and/or standing);Decreased knowledge of use of DME or AE;Pain OT Treatment Interventions: Self-care/ADL training;DME and/or AE instruction;Patient/family education;Balance training   OT Goals(Current goals can be found in the care plan section) Acute Rehab OT Goals Patient Stated Goal: to be home with her dog, stop falling OT Goal Formulation: With patient Time For Goal Achievement: 02/04/13 Potential to Achieve Goals: Good ADL Goals Pt Will Transfer to Toilet: with supervision;ambulating (high commode, all aspects) Pt Will Perform Tub/Shower Transfer: Tub transfer;ambulating;shower seat;tub bench Additional ADL Goal #1: pt will gather clothes at supervision level and complete bathing/dressing at this level  Visit Information  Last OT Received On: 01/21/13 Assistance Needed: +1 PT/OT Co-Evaluation/Treatment: Yes History of Present Illness: 67 y.o. female with h/o breast cancer s/p lumpectomy and radiation, colon cancer s/p hemicolectomy admitted with weakness, multiple falls, L distal radius fx, ARF, hypokalemia.        Prior [place] expects to be discharged to:: Private residence Living Arrangements: Alone Type of Home: House Home Access: Stairs to enter [employer] of Steps: 7 Entrance Stairs-Rails: Can reach both;Left;Right Home  Layout: One level Home Equipment: None;Grab bars - tub/shower;Grab  bars - toilet Prior Function Level of Independence: Independent Comments: multiple falls PTA, tub and shower with grab bar Communication Communication: No difficulties Dominant Hand: Right         Vision/Perception     Cognition  Cognition Arousal/Alertness: Awake/alert Behavior During Therapy: WFL for tasks assessed/performed Overall Cognitive Status: Within Functional Limits for tasks assessed    Extremity/Trunk Assessment Upper Extremity Assessment Upper Extremity Assessment: Defer to OT evaluation LUE Deficits / Details: wrist, splinted/immobilized.  Can only lift shoulder about 40 degrees--c/o pain around lateral scapula when raising.  elbow wfls Lower Extremity Assessment Lower Extremity Assessment: Overall WFL for tasks assessed Cervical / Trunk Assessment Cervical / Trunk Assessment: Kyphotic (significant forward head posture)     Mobility Bed Mobility Bed Mobility: Not assessed Transfers Transfers: Sit to Stand Sit to Stand: 4: Min guard;From toilet Stand to Sit: 4: Min guard;To toilet;To chair/3-in-1 Details for Transfer Assistance: VCs hand placement, min/guard for safety 2* h/o falls     Exercise     Balance Balance Balance Assessed: Yes Static Standing Balance Static Standing - Balance Support: No upper extremity supported Static Standing - Level of Assistance: 5: Stand by assistance Static Standing - Comment/# of Minutes: 1 minute   End of Session OT - End of Session Activity Tolerance: Patient tolerated treatment well Patient left: in chair;with call bell/phone within reach;with chair alarm set Nurse Communication: Mobility status  GO     Maly Lemarr 01/21/2013, 9:32 AM Lesle Chris, OTR/L [phone] 01/21/2013

## 2013-01-21 NOTE — Care Management Note (Addendum)
    Page 1 of 2   01/22/2013     2:05:03 PM   CARE MANAGEMENT NOTE 01/22/2013  Patient:  [name], [name]   Account Number:  [id number]  Date Initiated:  01/21/2013  Documentation initiated by:  [facility]  Subjective/Objective Assessment:   67 year old female admitted with anemia and generalized weakness.     Action/Plan:   From home.   Anticipated DC Date:  01/22/2013   Anticipated DC Plan:  [place]  CM consult      Choice offered to / List presented to:  C-1 Patient   DME arranged  [facility]      DME agency  [place] arranged  HH-1 RN  [place].   Status of service:  Completed, signed off Medicare Important Message given?  NA - LOS <3 / Initial given by admissions (If response is "NO", the following Medicare IM given date fields will be blank) Date Medicare IM given:   Date Additional Medicare IM given:    Discharge Disposition:  [place]  Per UR Regulation:  Reviewed for med. necessity/level of care/duration of stay  If discussed at Long Length of Stay Meetings, dates discussed:    Comments:  01/22/13 Jamarcus Laduke RN,BSN NCM [place] D/C HHRN-LAB DRAW/PT/OT/AIDE/DME ORDERS.NO FURTHER D/C NEEDS.  01/21/13 Kailoni Vahle RN,BSN NCM [phone] PT/OT-SNF.SPOKE TO PATIENT ABOUT D/C PLANS SHE DECLINES SNF,& WANTS HOME.PROVIDED [facility] AGENCY LIST.[facility] CHOSEN-TC KRISTEN REP INFORMED OF REFERRAL.PT RECOMMENDED [facility].WILL NEED FINAL HHC/DME ORDERS.

## 2013-01-21 NOTE — Progress Notes (Signed)
Pt had 2.03 second pause at 05:49 am.  Pt asymptomatic at this time, in NAD.  This RN had just assisted pt to [facility] and back to bed.  VSS.  Will continue to monitor pt.

## 2013-01-22 DIAGNOSIS — C189 Malignant neoplasm of colon, unspecified: Secondary | ICD-10-CM

## 2013-01-22 LAB — TYPE AND SCREEN: Unit division: 0

## 2013-01-22 LAB — BASIC METABOLIC PANEL
BUN: 30 mg/dL — ABNORMAL HIGH (ref 6–23)
CO2: 27 mEq/L (ref 19–32)
Chloride: 107 mEq/L (ref 96–112)
Creatinine, Ser: 1.26 mg/dL — ABNORMAL HIGH (ref 0.50–1.10)
GFR calc Af Amer: 50 mL/min — ABNORMAL LOW (ref >90)
Glucose, Bld: 86 mg/dL (ref 70–99)
Potassium: 3.2 mEq/L — ABNORMAL LOW (ref 3.5–5.1)

## 2013-01-22 LAB — CBC
HCT: 29.1 % — ABNORMAL LOW (ref 36.0–46.0)
Hemoglobin: 9.3 g/dL — ABNORMAL LOW (ref 12.0–15.0)
MCV: 77.2 fL — ABNORMAL LOW (ref 78.0–100.0)
RBC: 3.77 MIL/uL — ABNORMAL LOW (ref 3.87–5.11)
WBC: 4.6 10*3/uL (ref 4.0–10.5)

## 2013-01-22 MED ORDER — FUROSEMIDE 80 MG PO TABS
80.0000 mg | ORAL_TABLET | Freq: Two times a day (BID) | ORAL | Status: DC
  Filled 2013-01-22 (×2): qty 1

## 2013-01-22 MED ORDER — DIPHENHYDRAMINE HCL 50 MG/ML IJ SOLN
25.0000 mg | Freq: Once | INTRAMUSCULAR | Status: AC
  Administered 2013-01-22: 25 mg via INTRAVENOUS
  Filled 2013-01-22: qty 1

## 2013-01-22 MED ORDER — POTASSIUM CHLORIDE CRYS ER 20 MEQ PO TBCR
40.0000 meq | EXTENDED_RELEASE_TABLET | Freq: Two times a day (BID) | ORAL | Status: AC
  Administered 2013-01-22 (×2): 40 meq via ORAL
  Filled 2013-01-22 (×2): qty 2

## 2013-01-22 MED ORDER — FERROUS SULFATE 325 (65 FE) MG PO TABS: 325.0000 mg | ORAL_TABLET | Freq: Two times a day (BID) | ORAL | Status: DC

## 2013-01-22 MED ORDER — OXYCODONE HCL ER 40 MG PO T12A: 40.0000 mg | EXTENDED_RELEASE_TABLET | Freq: Three times a day (TID) | ORAL | Status: DC

## 2013-01-22 NOTE — Discharge Summary (Signed)
Physician Discharge Summary  [name] [id number] DOB: March 16, 1946 DOA: 01/20/2013  PCP: Kristine Garbe, MD  Admit date: 01/20/2013 Discharge date: 01/22/2013  Time spent: 35 minutes  Recommendations for Outpatient Follow-up:  Follow up with PCP ino ne week Follow up with potassium in 1 to 2 days and report to PCP.   Discharge Diagnoses:  Principal Problem:   Weakness generalized Active Problems:   Anemia   Falls frequently   GI bleed   Hypokalemia   HTN (hypertension)   H/O Breast cancer status post lumpectomy, radiation therapy   Hyponatremia   ARF (acute renal failure)   Discharge Condition: improved.   Diet recommendation: low sodium diet.   Filed Weights   01/20/13 2100 01/22/13 0459  Weight: 65.363 kg (144 lb 1.6 oz) 65.276 kg (143 lb 14.5 oz)    History of present illness:  Pt is 67 yo female with history of right breat cancer diagnosed in 2012, status post lumpectomy and radiation therapy, under Dr. Laurelyn Sickle care (currently on Anastrozole), history of colon cancer and status post right hemicolectomy by Dr. Molli Posey in 04/2013 (diagnosed after colonoscopy done by Dr. Watt Climes 04/2011 and showed near complete obstruction by colon cancer at the hepatic flexure), recently had biopsy of transverse descending colon polyp (11/2012) positive for tubular adenoma, now presents to [facility] ED with main concern of progressively worsening generalized weakness, several episodes of sudden falls, poor oral intake. She was found to have iron deficiency anemia. Her fobt is negative. GI called recommended no work up at this time. IV iron given and she is being discharged on oral iron supplementation. She was referred for rehabilitation for deconditioning, but she refused to go and wanted to go home with home PT.    Hospital Course:  Anemia, Iron deficiency  - h/o Stage 2 colon CA s/p hemicolectomy 12/12  - Hb was 11-13 range till 11/13  - Anemia panel with severe Iron deficiency  - FOBT in ED  negative  - s/p 2 units of PRBC overnight and H&H stable.  - requested [facility],  d/w Dr.Hayes, [facility], she had FU colonoscopy in 7/14 which showed a few polyps and otherwise normal, hence repeat studies not indicated  -no upper GI symptoms, gave her IV Iron and follow up with them outpatient.    Weakness generalized  - this appears to be multifactorial and secondary to anemia, progressive deconditioning, Polypharmacy- . Decreased the dose of oxy, and ordered home health PT .   Stage 2 colon CA  S/p Hemicolectomy 12/12  On surveillance  ARF (acute renal failure)  - last Cr 1.53 in 11/2012  -creatinine back at baseline, .  Hypokalemia  -from diuretics, replace d. Recheck potassium in 1 to 2 days.  Left wrist radial fracture  - splint ordered, OT evaluation ordered  -Fu with Ortho  Diastolic CHF, grade I, chronic  - resume lasix   HTN (hypertension)  -resume bystolic  Chronic pain  -on high dose MS Contin at home, due to increased lethargy will cut down dose to 40mg  Q8  -will need weaning, per PCP  H/O Stage 1 Breast cancer status post lumpectomy, radiation therapy  - continue Anastrozole, followed by Dr.Khan      Procedures:  none  Consultations:  GI consult over th ephone.  Discharge Exam: Filed Vitals:   01/22/13 0459  BP: 131/55  Pulse: 51  Temp: 97.6 F (36.4 C)  Resp: 18    General: alert afebrile comfortable Cardiovascular: s1s2 Respiratory: ctab.  Discharge Instructions      Discharge Orders   Future Orders Complete By Expires   Diet - low sodium heart healthy  As directed    Discharge instructions  As directed    Comments:     Follow up with PCP inone week Please check Potassium in 1 to 2 days .   Increase activity slowly  As directed        Medication List         ALPRAZolam 0.5 MG tablet  Commonly known as:  XANAX  Take 0.5 mg by mouth 2 (two) times daily as needed for anxiety.     anastrozole 1 MG tablet  Commonly known as:   ARIMIDEX  Take 1 tablet (1 mg total) by mouth at bedtime.     buPROPion 300 MG 24 hr tablet  Commonly known as:  WELLBUTRIN XL  Take 300 mg by mouth every morning.     docusate sodium 100 MG capsule  Commonly known as:  COLACE  Take 200 mg by mouth at bedtime.     ferrous sulfate 325 (65 FE) MG tablet  Commonly known as:  FERROUSUL  Take 1 tablet (325 mg total) by mouth 2 (two) times daily.     fluvoxaMINE 50 MG tablet  Commonly known as:  LUVOX  Take 50-100 mg by mouth 2 (two) times daily. She takes one tablet in the morning and two tablets in the evening.     furosemide 40 MG tablet  Commonly known as:  LASIX  Take 80 mg by mouth 2 (two) times daily.     nebivolol 10 MG tablet  Commonly known as:  BYSTOLIC  Take 10 mg by mouth every morning.     OxyCODONE 40 mg T12a 12 hr tablet  Commonly known as:  OXYCONTIN  Take 1 tablet (40 mg total) by mouth every 8 (eight) hours.     polyethylene glycol packet  Commonly known as:  MIRALAX / GLYCOLAX  Take 17 g by mouth daily as needed (For constipation.).     Potassium Gluconate 595 MG Caps  Take 595 mg by mouth 2 (two) times daily.       No Known Allergies Follow-up Information   Follow up with REESE,[name], MD. Schedule an appointment as soon as possible for a visit in 1 week.   Specialty:  Family Medicine   Contact information:   [id number] [place] [address] [phone]        The results of significant diagnostics from this hospitalization (including imaging, microbiology, ancillary and laboratory) are listed below for reference.    Significant Diagnostic Studies: Dg Wrist Complete Left  2013/02/11   *RADIOLOGY REPORT*  Clinical Data: Fall with left wrist pain  LEFT WRIST - COMPLETE 3+ VIEW  Comparison: None  Findings: A nondisplaced T-shaped fracture of the distal radius extends to the radiocarpal joint. There is no evidence of subluxation or dislocation. Degenerative changes at the first  carpometacarpal joint noted. No other focal bony abnormalities are present.  IMPRESSION: Nondisplaced intra-articular distal radial fracture.   Original Report Authenticated By: Margarette Canada, M.D.   Dg Hand Complete Left  02-11-2013   CLINICAL DATA:  Fall, left hand pain.  EXAM: LEFT HAND - COMPLETE 3+ VIEW  COMPARISON:  Feb 11, 2013  FINDINGS: Mild degenerative changes in the IP joints and 1st carpometacarpal joint. The previously seen distal radial fracture is again noted, but better visualized on the wrist series No additional acute bony  abnormality.  IMPRESSION: Distal left radial fracture, better seen on the wrist series. No additional acute bony abnormality.   Electronically Signed   By: Rolm Baptise   On: 01/20/2013 18:33    Microbiology: No results found for this or any previous visit (from the past 240 hour(s)).   Labs: Basic Metabolic Panel:  Recent Labs Lab 01/20/13 1800 01/20/13 2115 01/21/13 0820 01/22/13 0444  NA 134*  --  139 142  K 3.4*  --  3.3* 3.2*  CL 99  --  105 107  CO2 24  --  24 27  GLUCOSE 87  --  97 86  BUN 52*  --  38* 30*  CREATININE 1.80*  --  1.43* 1.26*  CALCIUM 9.7  --  9.5 10.0  MG  --  1.9  --   --    Liver Function Tests:  Recent Labs Lab 01/20/13 1800  AST 23  ALT 13  ALKPHOS 100  BILITOT 0.4  PROT 7.1  ALBUMIN 3.8   No results found for this basename: LIPASE, AMYLASE,  in the last 168 hours No results found for this basename: AMMONIA,  in the last 168 hours CBC:  Recent Labs Lab 01/20/13 1754 01/20/13 2115 01/21/13 0820 01/22/13 0444  WBC 5.3  --  4.1 4.6  NEUTROABS 3.4  --   --   --   HGB 7.2* 7.3* 9.0* 9.3*  HCT 23.5*  --  27.9* 29.1*  MCV 75.1*  --  76.6* 77.2*  PLT 161  --  151 174   Cardiac Enzymes: No results found for this basename: CKTOTAL, CKMB, CKMBINDEX, TROPONINI,  in the last 168 hours BNP: BNP (last 3 results) No results found for this basename: PROBNP,  in the last 8760 hours CBG: No results found for this  basename: GLUCAP,  in the last 168 hours     Signed:  Sanuel Ladnier  [facility] 01/22/2013, 11:27 AM

## 2013-01-28 ENCOUNTER — Telehealth: Payer: Self-pay | Admitting: Oncology

## 2013-01-28 ENCOUNTER — Other Ambulatory Visit: Payer: Self-pay | Admitting: Emergency Medicine

## 2013-01-28 DIAGNOSIS — C50919 Malignant neoplasm of unspecified site of unspecified female breast: Secondary | ICD-10-CM

## 2013-02-02 ENCOUNTER — Telehealth: Payer: Self-pay

## 2013-02-02 NOTE — Telephone Encounter (Signed)
Patient called and has questions in regards to her soft cast. Please call pt aback @ [phone]

## 2013-02-03 NOTE — Telephone Encounter (Signed)
Called again. Left message for her to call me back.  

## 2013-02-03 NOTE — Telephone Encounter (Signed)
Called patient no answer.

## 2013-02-04 ENCOUNTER — Encounter (HOSPITAL_COMMUNITY): Payer: Self-pay | Admitting: Emergency Medicine

## 2013-02-04 ENCOUNTER — Emergency Department (HOSPITAL_COMMUNITY)
Admission: EM | Admit: 2013-02-04 | Discharge: 2013-02-04 | Disposition: A | Discharge status: Home / Self Care | Payer: PRIVATE HEALTH INSURANCE | Attending: Emergency Medicine | Admitting: Emergency Medicine

## 2013-02-04 DIAGNOSIS — S52502D Unspecified fracture of the lower end of left radius, subsequent encounter for closed fracture with routine healing: Secondary | ICD-10-CM

## 2013-02-04 DIAGNOSIS — I10 Essential (primary) hypertension: Secondary | ICD-10-CM | POA: Insufficient documentation

## 2013-02-04 DIAGNOSIS — Z853 Personal history of malignant neoplasm of breast: Secondary | ICD-10-CM | POA: Insufficient documentation

## 2013-02-04 DIAGNOSIS — F329 Major depressive disorder, single episode, unspecified: Secondary | ICD-10-CM | POA: Insufficient documentation

## 2013-02-04 DIAGNOSIS — Z85038 Personal history of other malignant neoplasm of large intestine: Secondary | ICD-10-CM | POA: Insufficient documentation

## 2013-02-04 DIAGNOSIS — F3289 Other specified depressive episodes: Secondary | ICD-10-CM | POA: Insufficient documentation

## 2013-02-04 DIAGNOSIS — Z79899 Other long term (current) drug therapy: Secondary | ICD-10-CM | POA: Insufficient documentation

## 2013-02-04 DIAGNOSIS — Z87891 Personal history of nicotine dependence: Secondary | ICD-10-CM | POA: Insufficient documentation

## 2013-02-04 DIAGNOSIS — S5290XD Unspecified fracture of unspecified forearm, subsequent encounter for closed fracture with routine healing: Secondary | ICD-10-CM | POA: Insufficient documentation

## 2013-02-04 DIAGNOSIS — M25539 Pain in unspecified wrist: Secondary | ICD-10-CM | POA: Insufficient documentation

## 2013-02-04 DIAGNOSIS — G8929 Other chronic pain: Secondary | ICD-10-CM | POA: Insufficient documentation

## 2013-02-04 DIAGNOSIS — Z8781 Personal history of (healed) traumatic fracture: Secondary | ICD-10-CM | POA: Insufficient documentation

## 2013-02-04 MED ORDER — ACETAMINOPHEN 325 MG PO TABS
650.0000 mg | ORAL_TABLET | Freq: Once | ORAL | Status: AC
  Administered 2013-02-04: 650 mg via ORAL
  Filled 2013-02-04: qty 2

## 2013-02-04 NOTE — ED Provider Notes (Signed)
CSN: [id number]     Arrival date & time 02/04/13  1806 History  This chart was scribed for non-physician practitioner, Teofilo Pod, PA-C working with Blanchard Kelch, MD by Frederich Balding, ED scribe. This patient was seen in room WTR8/WTR8 and the patient's care was started at 7:16 PM.   Chief Complaint  Patient presents with  . Cast Repair   The history is provided by the patient. No language interpreter was used.    HPI Comments: [name] is a 67 y.o. female who presents to the Emergency Department for cast repair. She was here on 01/20/2013 and got a soft cast placed on her left arm. Pt has not followed up with an orthopedist. She was told to come here to get the cast repaired because she got it wet today and also a few days ago. Pt states she has mild pain in her left wrist. She denies numbness and tingling in her hand as associated symptoms. Pt has not taken anything for the pain.   Past Medical History  Diagnosis Date  . Hypertension   . Depression   . Back pain, chronic   . Breast cancer 09/09/10  . Cancer of colon 05/24/2011   Past Surgical History  Procedure Laterality Date  . Back surgery    . Shoulder surgery    . Tumor removal  Tumor removed R breast  . Lumbar disc surgery    . Partial hysterectomy    . Colonoscopy  05/03/2011    Procedure: COLONOSCOPY;  Surgeon: Jeryl Columbia, MD;  Location: [facility] ENDOSCOPY;  Service: Endoscopy;  Laterality: N/A;  . Colon surgery  04/2011  . Breast lumpectomy  10/10/2010    Rt Breast  . Joint replacement      R&L total shoulder replacements   Family History  Problem Relation Age of Onset  . Diabetes Mother   . Hypertension Mother   . Cancer Mother     leukemia  . Sudden death Mother   . Anesthesia problems Neg Hx   . Hypotension Neg Hx   . Malignant hyperthermia Neg Hx   . Pseudochol deficiency Neg Hx    History  Substance Use Topics  . Smoking status: Former Smoker -- 1.00 packs/day    Types: Cigarettes  .  Smokeless tobacco: Former Systems developer    Quit date: 04/22/2010  . Alcohol Use: No   OB History   Grav Para Term Preterm Abortions TAB SAB Ect Mult Living                 Review of Systems  Musculoskeletal: Positive for arthralgias.  Neurological: Negative for numbness.  All other systems reviewed and are negative.    Allergies  Review of patient's allergies indicates no known allergies.  Home Medications   Current Outpatient Rx  Name  Route  Sig  Dispense  Refill  . ALPRAZolam (XANAX) 0.5 MG tablet   Oral   Take 0.5 mg by mouth 2 (two) times daily as needed for anxiety.         Marland Kitchen anastrozole (ARIMIDEX) 1 MG tablet   Oral   Take 1 tablet (1 mg total) by mouth at bedtime.   90 tablet   3   . buPROPion (WELLBUTRIN XL) 300 MG 24 hr tablet   Oral   Take 300 mg by mouth every morning.          . docusate sodium (COLACE) 100 MG capsule   Oral   Take 200  mg by mouth at bedtime.         . ferrous sulfate (FERROUSUL) 325 (65 FE) MG tablet   Oral   Take 1 tablet (325 mg total) by mouth 2 (two) times daily.   90 tablet   3   . fluvoxaMINE (LUVOX) 50 MG tablet   Oral   Take 50-100 mg by mouth 2 (two) times daily. She takes one tablet in the morning and two tablets in the evening.         . furosemide (LASIX) 40 MG tablet   Oral   Take 80 mg by mouth 2 (two) times daily.         . nebivolol (BYSTOLIC) 10 MG tablet   Oral   Take 10 mg by mouth every morning.          . OxyCODONE (OXYCONTIN) 40 mg T12A 12 hr tablet   Oral   Take 1 tablet (40 mg total) by mouth every 8 (eight) hours.   60 tablet   0   . polyethylene glycol (MIRALAX / GLYCOLAX) packet   Oral   Take 17 g by mouth daily as needed (For constipation.).          Marland Kitchen Potassium Gluconate 595 MG CAPS   Oral   Take 595 mg by mouth 2 (two) times daily.           BP 125/73  Pulse 69  Temp(Src) 98.6 F (37 C) (Oral)  Resp 20  SpO2 98%  Physical Exam  Nursing note and vitals  reviewed. Constitutional: She appears well-developed and well-nourished.  HENT:  Head: Normocephalic and atraumatic.  Mouth/Throat: Oropharynx is clear and moist.  Eyes: EOM are normal. Pupils are equal, round, and reactive to light.  Neck: Normal range of motion. Neck supple.  Cardiovascular: Normal rate, regular rhythm and normal heart sounds.   Capillary refill is less than 3 seconds in all of her fingers.   Pulmonary/Chest: Effort normal and breath sounds normal. No respiratory distress. She has no wheezes. She has no rales.  Musculoskeletal: Normal range of motion.  Neurological: She is alert.  Sensation intact.   Skin: Skin is warm and dry.  Psychiatric: She has a normal mood and affect. Her behavior is normal.    ED Course  Procedures (including critical care time)  DIAGNOSTIC STUDIES: Oxygen Saturation is 98% on RA, normal by my interpretation.    COORDINATION OF CARE: 7:22 PM-Discussed treatment plan which includes replacing her cast with pt at bedside and pt agreed to plan. Will give pt resources to find a PCP and advised her to follow up with an orthopaedist.   Labs Review Labs Reviewed - No data to display Imaging Review No results found.  MDM  No diagnosis found. Patient presenting today requesting to have her splint replaced due to the fact that it got wet.  Splint replaced in the ED.  Patient neurovascularly intact.  Patient instructed to follow up with Orthopedist.  I personally performed the services described in this documentation, which was scribed in my presence. The recorded information has been reviewed and is accurate.   Sherlyn Lees [place], PA-C 02/05/13 2059

## 2013-02-04 NOTE — ED Notes (Signed)
Patient left before Dc instructions were given

## 2013-02-04 NOTE — ED Notes (Signed)
Pt had soft cast placed on left arm on 01-20-13, was told to come back up here for check because she got it wet.

## 2013-02-05 ENCOUNTER — Encounter (HOSPITAL_COMMUNITY): Payer: Self-pay | Admitting: *Deleted

## 2013-02-05 ENCOUNTER — Other Ambulatory Visit: Payer: PRIVATE HEALTH INSURANCE | Admitting: Lab

## 2013-02-05 ENCOUNTER — Emergency Department (HOSPITAL_COMMUNITY)
Admission: EM | Admit: 2013-02-05 | Discharge: 2013-02-05 | Disposition: A | Discharge status: Home / Self Care | Payer: PRIVATE HEALTH INSURANCE | Attending: Emergency Medicine | Admitting: Emergency Medicine

## 2013-02-05 ENCOUNTER — Other Ambulatory Visit: Payer: Self-pay | Admitting: Emergency Medicine

## 2013-02-05 ENCOUNTER — Ambulatory Visit: Payer: PRIVATE HEALTH INSURANCE | Admitting: Oncology

## 2013-02-05 ENCOUNTER — Telehealth: Payer: Self-pay | Admitting: Oncology

## 2013-02-05 DIAGNOSIS — F329 Major depressive disorder, single episode, unspecified: Secondary | ICD-10-CM | POA: Insufficient documentation

## 2013-02-05 DIAGNOSIS — I10 Essential (primary) hypertension: Secondary | ICD-10-CM | POA: Insufficient documentation

## 2013-02-05 DIAGNOSIS — M549 Dorsalgia, unspecified: Secondary | ICD-10-CM | POA: Insufficient documentation

## 2013-02-05 DIAGNOSIS — G8929 Other chronic pain: Secondary | ICD-10-CM | POA: Insufficient documentation

## 2013-02-05 DIAGNOSIS — I83893 Varicose veins of bilateral lower extremities with other complications: Secondary | ICD-10-CM | POA: Insufficient documentation

## 2013-02-05 DIAGNOSIS — I8390 Asymptomatic varicose veins of unspecified lower extremity: Secondary | ICD-10-CM

## 2013-02-05 DIAGNOSIS — Z79899 Other long term (current) drug therapy: Secondary | ICD-10-CM | POA: Insufficient documentation

## 2013-02-05 DIAGNOSIS — F3289 Other specified depressive episodes: Secondary | ICD-10-CM | POA: Insufficient documentation

## 2013-02-05 LAB — POCT I-STAT, CHEM 8
Calcium, Ion: 1.19 mmol/L (ref 1.13–1.30)
Chloride: 107 mEq/L (ref 96–112)
HCT: 28 % — ABNORMAL LOW (ref 36.0–46.0)
Hemoglobin: 9.5 g/dL — ABNORMAL LOW (ref 12.0–15.0)
TCO2: 26 mmol/L (ref 0–100)

## 2013-02-05 LAB — CBC
MCH: 26.4 pg (ref 26.0–34.0)
MCHC: 31.9 g/dL (ref 30.0–36.0)
MCV: 82.7 fL (ref 78.0–100.0)
Platelets: 270 10*3/uL (ref 150–400)
RDW: 22.2 % — ABNORMAL HIGH (ref 11.5–15.5)
WBC: 11.4 10*3/uL — ABNORMAL HIGH (ref 4.0–10.5)

## 2013-02-05 MED ORDER — OXYCODONE-ACETAMINOPHEN 5-325 MG PO TABS
2.0000 | ORAL_TABLET | Freq: Once | ORAL | Status: AC
  Administered 2013-02-05: 2 via ORAL
  Filled 2013-02-05: qty 2

## 2013-02-05 MED ORDER — SODIUM CHLORIDE 0.9 % IV BOLUS (SEPSIS)
1000.0000 mL | Freq: Once | INTRAVENOUS | Status: AC
  Administered 2013-02-05: 1000 mL via INTRAVENOUS

## 2013-02-05 MED ORDER — HYDROCODONE-ACETAMINOPHEN 5-325 MG PO TABS
1.0000 | ORAL_TABLET | Freq: Once | ORAL | Status: DC
  Filled 2013-02-05: qty 1

## 2013-02-05 NOTE — ED Provider Notes (Signed)
Medical screening examination/treatment/procedure(s) were conducted as a shared visit with non-physician practitioner(s) and myself.  I personally evaluated the patient during the encounter  Spontaneous bleeding from varicose vein, now stopped.  Vitals stable.  No distress. Intact DP and PT pulses.  Ezequiel Essex, MD 02/05/13 [phone]

## 2013-02-05 NOTE — ED Notes (Signed)
Waiting for ride 

## 2013-02-05 NOTE — ED Provider Notes (Signed)
CSN: [id number]     Arrival date & time 02/05/13  1738 History   First MD Initiated Contact with Patient 02/05/13 1755     Chief Complaint  Patient presents with  . Extremity Laceration   (Consider location/radiation/quality/duration/timing/severity/associated sxs/prior Treatment) HPI Comments: 67 year old female with a past medical history of hypertension, chronic back pain, breast cancer, colon cancer and depression presents to the emergency department via EMS with complaints of her bleeding varicose vein that she noticed while she was in the shower. Denies any trauma. Per EMS, they state she had an estimated amount of 500 mL of blood loss from her foot. On their arrival to her home, her blood pressure was 88/54 and she appeared pale. She was given a 500 bolus which increased her blood pressure to 118/58. Currently patient states she feels a little lightheaded. Denies confusion, pain, dizziness. Denies falling.   The history is provided by the patient and the EMS personnel.    Past Medical History  Diagnosis Date  . Hypertension   . Depression   . Back pain, chronic   . Breast cancer 09/09/10  . Cancer of colon 05/24/2011   Past Surgical History  Procedure Laterality Date  . Back surgery    . Shoulder surgery    . Tumor removal  Tumor removed R breast  . Lumbar disc surgery    . Partial hysterectomy    . Colonoscopy  05/03/2011    Procedure: COLONOSCOPY;  Surgeon: Jeryl Columbia, MD;  Location: [facility] ENDOSCOPY;  Service: Endoscopy;  Laterality: N/A;  . Colon surgery  04/2011  . Breast lumpectomy  10/10/2010    Rt Breast  . Joint replacement      R&L total shoulder replacements   Family History  Problem Relation Age of Onset  . Diabetes Mother   . Hypertension Mother   . Cancer Mother     leukemia  . Sudden death Mother   . Anesthesia problems Neg Hx   . Hypotension Neg Hx   . Malignant hyperthermia Neg Hx   . Pseudochol deficiency Neg Hx    History  Substance Use Topics  .  Smoking status: Former Smoker -- 1.00 packs/day    Types: Cigarettes  . Smokeless tobacco: Former Systems developer    Quit date: 04/22/2010  . Alcohol Use: No   OB History   Grav Para Term Preterm Abortions TAB SAB Ect Mult Living                 Review of Systems  Constitutional: Negative for activity change.  Respiratory: Negative for shortness of breath.   Cardiovascular: Negative for chest pain.  Gastrointestinal: Negative for nausea.  Skin: Positive for wound.  Neurological: Positive for light-headedness. Negative for dizziness and syncope.  Psychiatric/Behavioral: Negative for confusion.  All other systems reviewed and are negative.    Allergies  Review of patient's allergies indicates no known allergies.  Home Medications   Current Outpatient Rx  Name  Route  Sig  Dispense  Refill  . ALPRAZolam (XANAX) 0.5 MG tablet   Oral   Take 0.5 mg by mouth 2 (two) times daily as needed for anxiety.         Marland Kitchen anastrozole (ARIMIDEX) 1 MG tablet   Oral   Take 1 tablet (1 mg total) by mouth at bedtime.   90 tablet   3   . buPROPion (WELLBUTRIN XL) 300 MG 24 hr tablet   Oral   Take 300 mg by mouth every morning.          Marland Kitchen  docusate sodium (COLACE) 100 MG capsule   Oral   Take 200 mg by mouth at bedtime.         . ferrous sulfate (FERROUSUL) 325 (65 FE) MG tablet   Oral   Take 1 tablet (325 mg total) by mouth 2 (two) times daily.   90 tablet   3   . fluvoxaMINE (LUVOX) 50 MG tablet   Oral   Take 50-100 mg by mouth 2 (two) times daily. She takes one tablet in the morning and two tablets in the evening.         . furosemide (LASIX) 40 MG tablet   Oral   Take 80 mg by mouth 2 (two) times daily.         . nebivolol (BYSTOLIC) 10 MG tablet   Oral   Take 10 mg by mouth every morning.          . OxyCODONE (OXYCONTIN) 40 mg T12A 12 hr tablet   Oral   Take 1 tablet (40 mg total) by mouth every 8 (eight) hours.   60 tablet   0   . polyethylene glycol powder (QC  NATURA-LAX) powder   Oral   Take 17 g by mouth daily as needed (constipation).         . Potassium Gluconate 595 MG CAPS   Oral   Take 595 mg by mouth 2 (two) times daily.           BP 109/45  Pulse 57  Temp(Src) 97.9 F (36.6 C) (Oral)  Resp 16  SpO2 100% Physical Exam  Nursing note and vitals reviewed. Constitutional: She is oriented to person, place, and time. She appears well-developed and well-nourished. No distress.  HENT:  Head: Normocephalic and atraumatic.  Mouth/Throat: Oropharynx is clear and moist and mucous membranes are normal. Mucous membranes are not pale.  Eyes: Conjunctivae and EOM are normal. Pupils are equal, round, and reactive to light.  Neck: Normal range of motion. Neck supple.  Cardiovascular: Normal rate, regular rhythm, normal heart sounds and intact distal pulses.   +2 DP/PT pulses bilateral. Capillary refill less than 3 seconds.  Pulmonary/Chest: Effort normal and breath sounds normal.  Musculoskeletal: Normal range of motion. She exhibits no edema.       Feet:  Neurological: She is alert and oriented to person, place, and time.  Skin: Skin is warm and dry. She is not diaphoretic. No pallor.  Psychiatric: She has a normal mood and affect. Her behavior is normal.    ED Course  Procedures (including critical care time) Labs Review Labs Reviewed  CBC - Abnormal; Notable for the following:    WBC 11.4 (*)    RBC 3.41 (*)    Hemoglobin 9.0 (*)    HCT 28.2 (*)    RDW 22.2 (*)    All other components within normal limits  POCT I-STAT, CHEM 8 - Abnormal; Notable for the following:    BUN 26 (*)    Creatinine, Ser 1.50 (*)    Glucose, Bld 108 (*)    Hemoglobin 9.5 (*)    HCT 28.0 (*)    All other components within normal limits   Imaging Review No results found.  MDM   1. Varicose vein    Patient with a bleeding varicose vein, no active bleeding in the emergency department. Patient is well-appearing and in no apparent distress.  Vitals have been stable in the emergency department, blood pressure 126/65. After receiving fluids she is no longer  lightheaded. She is stable for discharge. Patient also evaluated by Dr. Estil Daft agrees with plan of care.    Illene Labrador, PA-C 02/05/13 2016

## 2013-02-05 NOTE — ED Notes (Signed)
Bed: [facility] Expected date:  Expected time:  Means of arrival:  Comments: EMS-hypotension

## 2013-02-05 NOTE — Telephone Encounter (Signed)
, °

## 2013-02-05 NOTE — ED Notes (Signed)
Per ems pt had varicose vein bust on top of right foot while in shower. ems estimates at least 500 ml blood loss. Pt initial blood pressure 88/54 and pt was pale. Pt given 54ml NS bolus. BP 118/58. Pt alert and oriented x4 upon arrival to ED. Bleeding controlled. Pt has hx of falls.

## 2013-02-06 NOTE — ED Provider Notes (Signed)
Medical screening examination/treatment/procedure(s) were performed by non-physician practitioner and as supervising physician I was immediately available for consultation/collaboration.   Blanchard Kelch, MD 02/06/13 [phone]

## 2013-03-14 ENCOUNTER — Ambulatory Visit (HOSPITAL_BASED_OUTPATIENT_CLINIC_OR_DEPARTMENT_OTHER): Payer: PRIVATE HEALTH INSURANCE | Admitting: Oncology

## 2013-03-14 ENCOUNTER — Encounter: Payer: Self-pay | Admitting: Oncology

## 2013-03-14 ENCOUNTER — Telehealth: Payer: Self-pay | Admitting: Oncology

## 2013-03-14 ENCOUNTER — Encounter (INDEPENDENT_AMBULATORY_CARE_PROVIDER_SITE_OTHER): Payer: Self-pay

## 2013-03-14 ENCOUNTER — Other Ambulatory Visit (HOSPITAL_BASED_OUTPATIENT_CLINIC_OR_DEPARTMENT_OTHER): Payer: PRIVATE HEALTH INSURANCE | Admitting: Lab

## 2013-03-14 VITALS — BP 129/66 | HR 51 | Temp 97.6°F | Resp 19 | Ht 63.0 in | Wt 138.9 lb

## 2013-03-14 DIAGNOSIS — Z170 Estrogen receptor positive status [ER+]: Secondary | ICD-10-CM

## 2013-03-14 DIAGNOSIS — C50919 Malignant neoplasm of unspecified site of unspecified female breast: Secondary | ICD-10-CM

## 2013-03-14 DIAGNOSIS — N61 Mastitis without abscess: Secondary | ICD-10-CM

## 2013-03-14 DIAGNOSIS — M8580 Other specified disorders of bone density and structure, unspecified site: Secondary | ICD-10-CM

## 2013-03-14 DIAGNOSIS — C189 Malignant neoplasm of colon, unspecified: Secondary | ICD-10-CM

## 2013-03-14 DIAGNOSIS — C50419 Malignant neoplasm of upper-outer quadrant of unspecified female breast: Secondary | ICD-10-CM

## 2013-03-14 DIAGNOSIS — C182 Malignant neoplasm of ascending colon: Secondary | ICD-10-CM

## 2013-03-14 LAB — CBC WITH DIFFERENTIAL/PLATELET
BASO%: 0.3 % (ref 0.0–2.0)
EOS%: 1.1 % (ref 0.0–7.0)
MCH: 29.1 pg (ref 25.1–34.0)
MCHC: 32.4 g/dL (ref 31.5–36.0)
MONO#: 0.5 10*3/uL (ref 0.1–0.9)
RBC: 4.12 10*6/uL (ref 3.70–5.45)
RDW: 22.6 % — ABNORMAL HIGH (ref 11.2–14.5)
WBC: 7.4 10*3/uL (ref 3.9–10.3)
lymph#: 1.7 10*3/uL (ref 0.9–3.3)
nRBC: 0 % (ref 0–0)

## 2013-03-14 LAB — BASIC METABOLIC PANEL (CC13)
Anion Gap: 11 mEq/L (ref 3–11)
BUN: 31.1 mg/dL — ABNORMAL HIGH (ref 7.0–26.0)
CO2: 29 mEq/L (ref 22–29)
Calcium: 10.5 mg/dL — ABNORMAL HIGH (ref 8.4–10.4)
Glucose: 103 mg/dl (ref 70–140)
Potassium: 4.1 mEq/L (ref 3.5–5.1)

## 2013-03-14 LAB — IRON AND TIBC CHCC
Iron: 236 ug/dL — ABNORMAL HIGH (ref 41–142)
TIBC: 295 ug/dL (ref 236–444)

## 2013-03-14 NOTE — Progress Notes (Signed)
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 At her final pathology revealed a low grade invasive ductal carcinoma with with 0 of 2 lymph nodes positive for metastatic disease. The tumor was ER positive PR positive HER-2/neu negative.  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 Post lumpectomy patient went on to receive radiation therapy from 12/28/2010 2 01/18/2011 to the right breast. She thereafter received single agent Arimidex 1 mg daily starting on 02/06/2011.  #2 05/01/2011 patient developed generalized weakness shortness of breath and syncopal episode. She had a CT of the head performed that was negative. She had CBC done with that showed a hemoglobin of 6.7. She received 3 units of packed red cells. She was severely iron deficient. FOB was positive. Patient had a GI consult performed during her hospitalization and on 1212 she underwent a colonoscopy that revealed a near obstructing colonic mass worrisome for a hepatic flexure tumor. She had biopsies performed that revealed an adenocarcinoma. She went on to have a right segmental resection performed on 05/05/2011. The final pathology revealed a 5.0 cm poorly differentiated high grade invasive adenocarcinoma with patchy mucinous  features invading through the muscularis propria into pericolonic fat he tissue 34 lymph nodes were negative for metastatic disease. (T3 N0)  CURRENT THERAPY:  1. Breast cancer: arimidex 2. Colon Cancer: observation  INTERVAL HISTORY: [name] 67 y.o. female returns for followup visit today.  She has not had any fevers chills night sweats no shortness of breath no chest pains or palpitations no swelling in her legs. Remainder of the 10 point review of systems is negative.  MEDICAL HISTORY: Past Medical History  Diagnosis Date  . Hypertension   . Depression   . Back pain, chronic   . Breast cancer 09/09/10  . Cancer of colon 05/24/2011    ALLERGIES:  has No Known Allergies.  MEDICATIONS:  Current Outpatient Prescriptions  Medication Sig Dispense Refill  . ALPRAZolam (XANAX) 0.5 MG tablet Take 0.5 mg by mouth 2 (two) times daily as needed for anxiety.      Marland Kitchen anastrozole (ARIMIDEX) 1 MG tablet Take 1 tablet (1 mg total) by mouth at bedtime.  90 tablet  3  . buPROPion (WELLBUTRIN XL) 300 MG 24 hr tablet Take 300 mg by mouth every morning.       . docusate sodium (COLACE) 100 MG capsule Take 200 mg by mouth at bedtime.      . ferrous sulfate (FERROUSUL) 325 (65 FE) MG tablet Take 1 tablet (325 mg total) by mouth 2 (two) times daily.  90 tablet  3  . fluvoxaMINE (LUVOX) 50 MG tablet Take 50-100 mg by mouth 2 (two) times daily. She takes one tablet in the morning and two tablets in the evening.      . furosemide (LASIX) 40 MG tablet Take 80 mg by  mouth 2 (two) times daily.      . nebivolol (BYSTOLIC) 10 MG tablet Take 10 mg by mouth every morning.       . OxyCODONE (OXYCONTIN) 40 mg T12A 12 hr tablet Take 1 tablet (40 mg total) by mouth every 8 (eight) hours.  60 tablet  0  . polyethylene glycol powder (QC NATURA-LAX) powder Take 17 g by mouth daily as needed (constipation).      . Potassium Gluconate 595 MG CAPS Take 595 mg by mouth 2 (two) times daily.        No current  facility-administered medications for this visit.    SURGICAL HISTORY:  Past Surgical History  Procedure Laterality Date  . Back surgery    . Shoulder surgery    . Tumor removal  Tumor removed R breast  . Lumbar disc surgery    . Partial hysterectomy    . Colonoscopy  05/03/2011    Procedure: COLONOSCOPY;  Surgeon: Jeryl Columbia, MD;  Location: [facility] ENDOSCOPY;  Service: Endoscopy;  Laterality: N/A;  . Colon surgery  04/2011  . Breast lumpectomy  10/10/2010    Rt Breast  . Joint replacement      R&L total shoulder replacements    REVIEW OF SYSTEMS:  As in the interim history   PHYSICAL EXAMINATION: Patient is awake alert in no acute distress.   HEENT exam: EOMI PERRLA sclerae anicteric no conjunctival pallor oral mucosa is moist.  Neck: Supple no palpable cervical supraclavicular or axillary adenopathy.  Lungs are clear bilaterally to auscultation and percussion cardiovascular  Cardiovascular: Regular rate rhythm no murmurs gallops or rubs.  Abdomen is soft nontender nondistended bowel sounds are present well-healed surgical scar. There is no hepatosplenomegaly.  Extremities +2 edema there is noted to be some tenderness bilaterally in the calf.  Neuro patient's alert oriented x3 otherwise nonfocal. Patient does has considerable amount of anxiety  Right breast still remains erythematous but it is less swollen in comparison to my exam while she was hospitalized. There is no nipple discharge no inversion or retraction. Left breast no masses or nipple discharge   ECOG PERFORMANCE STATUS: 1 - Symptomatic but completely ambulatory  Blood pressure 129/66, pulse 51, temperature 97.6 F (36.4 C), temperature source Oral, resp. rate 19, height 5\' 3"  (1.6 m), weight 138 lb 14.4 oz (63.005 kg).  LABORATORY DATA: Lab Results  Component Value Date   WBC 7.4 03/14/2013   HGB 12.0 03/14/2013   HCT 37.0 03/14/2013   MCV 89.8 03/14/2013   PLT 245 03/14/2013      Chemistry       Component Value Date/Time   NA 144 03/14/2013 1119   NA 143 02/05/2013 1931   K 4.1 03/14/2013 1119   K 4.0 02/05/2013 1931   CL 107 02/05/2013 1931   CL 101 04/01/2012 1232   CO2 29 03/14/2013 1119   CO2 27 01/22/2013 0444   BUN 31.1* 03/14/2013 1119   BUN 26* 02/05/2013 1931   CREATININE 1.4* 03/14/2013 1119   CREATININE 1.50* 02/05/2013 1931      Component Value Date/Time   CALCIUM 10.5* 03/14/2013 1119   CALCIUM 10.0 01/22/2013 0444   ALKPHOS 100 01/20/2013 1800   ALKPHOS 103 04/01/2012 1232   AST 23 01/20/2013 1800   AST 24 04/01/2012 1232   ALT 13 01/20/2013 1800   ALT 19 04/01/2012 1232   BILITOT 0.4 01/20/2013 1800   BILITOT 0.47 04/01/2012 1232       ASSESSMENT: 67 year old female  with 2 primary malignancies:  1.  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  2.   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 Patient is seen in medical oncology for consideration of treatment options.   #3 right breast mastitis resolved  #4 recent left wrist fracture   PLAN:  1. breast cancer: Patient will continue Arimidex 1 mg daily for a duration of 5 years.  #2 stage II adenocarcinoma of the colon: At this time no recommendations are made for adjuvant chemotherapy. Patient has a relatively good prognosis. The risks of treatments would far outweigh the benefits of adjuvant chemotherapy. All of this is discussed with the patient and her niece and they do understand.  #3 we will plan on obtaining a bone density scan on her.  #4 patient will be seen back in 4 months time for followup  All questions were answered. The patient knows to call the clinic with any problems, questions or concerns. We can certainly see the patient much sooner if necessary.  I spent 25 minutes counseling the patient face to face. The total time spent in the  appointment was 25 minutes.    [name], MD Medical/Oncology [facility] [phone] (beeper) [phone] (Office)

## 2013-03-14 NOTE — Patient Instructions (Signed)
Proceed with bone density to evaluate for osteopenis/osteoporosis  Take vitamin D3 and calcium  Continue taking arimidex 1 mg daily  We will see you back in 4 months

## 2013-04-25 ENCOUNTER — Other Ambulatory Visit: Payer: PRIVATE HEALTH INSURANCE

## 2013-09-09 ENCOUNTER — Ambulatory Visit: Payer: PRIVATE HEALTH INSURANCE

## 2013-09-16 ENCOUNTER — Ambulatory Visit: Payer: PRIVATE HEALTH INSURANCE | Attending: Orthopedic Surgery

## 2013-09-16 ENCOUNTER — Encounter (INDEPENDENT_AMBULATORY_CARE_PROVIDER_SITE_OTHER): Payer: Self-pay

## 2013-09-16 DIAGNOSIS — IMO0001 Reserved for inherently not codable concepts without codable children: Secondary | ICD-10-CM | POA: Diagnosis present

## 2013-09-16 DIAGNOSIS — M6281 Muscle weakness (generalized): Secondary | ICD-10-CM | POA: Insufficient documentation

## 2013-09-16 DIAGNOSIS — M25673 Stiffness of unspecified ankle, not elsewhere classified: Secondary | ICD-10-CM | POA: Insufficient documentation

## 2013-09-16 DIAGNOSIS — R269 Unspecified abnormalities of gait and mobility: Secondary | ICD-10-CM | POA: Diagnosis not present

## 2013-09-16 DIAGNOSIS — R609 Edema, unspecified: Secondary | ICD-10-CM | POA: Insufficient documentation

## 2013-09-16 DIAGNOSIS — M25676 Stiffness of unspecified foot, not elsewhere classified: Secondary | ICD-10-CM | POA: Insufficient documentation

## 2013-09-16 DIAGNOSIS — M25579 Pain in unspecified ankle and joints of unspecified foot: Secondary | ICD-10-CM | POA: Diagnosis not present

## 2013-09-19 ENCOUNTER — Telehealth: Payer: Self-pay | Admitting: Oncology

## 2013-09-19 NOTE — Telephone Encounter (Signed)
S/W PT TO ADVISE APT 5/11 CX'D AND MOVED TO 6/9 DUE TO KK LOA. PT STATED SHE DIDN'T KNOW SHE HAD AN APPT ON 5/11 AND CAN NOT MAKE A FUTURE APPT AT THIS TIME BECAUSE SHE HAS OTHER ISSUES GOING ON THAT SHE NEEDS RESOLVED AND ONCE THAT HAPPENS SHE WILL CALL us TO R/S APPT. PT STATED THAT SHE'S BROKEN HER WRIST AND NEEDS TO GET THAT FIXED AND HAVE PT BEFORE SHE CAN MAKE ANOTHER APPT. I GAVE PT NUMBER TO CALL TO R/S WHENS SHE'S READY.

## 2013-09-22 ENCOUNTER — Encounter: Payer: PRIVATE HEALTH INSURANCE | Admitting: Rehabilitation

## 2013-09-23 ENCOUNTER — Ambulatory Visit: Payer: PRIVATE HEALTH INSURANCE | Attending: Orthopedic Surgery

## 2013-09-23 DIAGNOSIS — M25673 Stiffness of unspecified ankle, not elsewhere classified: Secondary | ICD-10-CM | POA: Insufficient documentation

## 2013-09-23 DIAGNOSIS — R269 Unspecified abnormalities of gait and mobility: Secondary | ICD-10-CM | POA: Insufficient documentation

## 2013-09-23 DIAGNOSIS — M25579 Pain in unspecified ankle and joints of unspecified foot: Secondary | ICD-10-CM | POA: Diagnosis not present

## 2013-09-23 DIAGNOSIS — M6281 Muscle weakness (generalized): Secondary | ICD-10-CM | POA: Insufficient documentation

## 2013-09-23 DIAGNOSIS — Z5189 Encounter for other specified aftercare: Secondary | ICD-10-CM | POA: Insufficient documentation

## 2013-09-23 DIAGNOSIS — R609 Edema, unspecified: Secondary | ICD-10-CM | POA: Diagnosis not present

## 2013-09-23 DIAGNOSIS — M25676 Stiffness of unspecified foot, not elsewhere classified: Secondary | ICD-10-CM | POA: Insufficient documentation

## 2013-09-24 ENCOUNTER — Encounter: Payer: PRIVATE HEALTH INSURANCE | Admitting: Rehabilitation

## 2013-09-29 ENCOUNTER — Encounter: Payer: PRIVATE HEALTH INSURANCE | Admitting: Rehabilitation

## 2013-09-29 ENCOUNTER — Ambulatory Visit: Payer: PRIVATE HEALTH INSURANCE | Admitting: Oncology

## 2013-09-29 ENCOUNTER — Other Ambulatory Visit: Payer: PRIVATE HEALTH INSURANCE

## 2013-09-30 ENCOUNTER — Ambulatory Visit: Payer: PRIVATE HEALTH INSURANCE | Admitting: Physical Therapy

## 2013-09-30 DIAGNOSIS — Z5189 Encounter for other specified aftercare: Secondary | ICD-10-CM | POA: Diagnosis not present

## 2013-10-01 ENCOUNTER — Ambulatory Visit: Payer: PRIVATE HEALTH INSURANCE

## 2013-10-01 DIAGNOSIS — Z5189 Encounter for other specified aftercare: Secondary | ICD-10-CM | POA: Diagnosis not present

## 2013-10-14 ENCOUNTER — Encounter: Payer: PRIVATE HEALTH INSURANCE | Admitting: Physical Therapy

## 2013-10-15 ENCOUNTER — Ambulatory Visit: Payer: PRIVATE HEALTH INSURANCE | Admitting: Physical Therapy

## 2013-10-15 DIAGNOSIS — Z5189 Encounter for other specified aftercare: Secondary | ICD-10-CM | POA: Diagnosis not present

## 2013-10-16 ENCOUNTER — Ambulatory Visit: Payer: PRIVATE HEALTH INSURANCE | Admitting: Physical Therapy

## 2013-10-16 DIAGNOSIS — Z5189 Encounter for other specified aftercare: Secondary | ICD-10-CM | POA: Diagnosis not present

## 2013-10-21 ENCOUNTER — Ambulatory Visit: Payer: PRIVATE HEALTH INSURANCE | Attending: Orthopedic Surgery | Admitting: Physical Therapy

## 2013-10-21 DIAGNOSIS — M25673 Stiffness of unspecified ankle, not elsewhere classified: Secondary | ICD-10-CM | POA: Insufficient documentation

## 2013-10-21 DIAGNOSIS — M25579 Pain in unspecified ankle and joints of unspecified foot: Secondary | ICD-10-CM | POA: Insufficient documentation

## 2013-10-21 DIAGNOSIS — R269 Unspecified abnormalities of gait and mobility: Secondary | ICD-10-CM | POA: Insufficient documentation

## 2013-10-21 DIAGNOSIS — Z5189 Encounter for other specified aftercare: Secondary | ICD-10-CM | POA: Insufficient documentation

## 2013-10-21 DIAGNOSIS — M6281 Muscle weakness (generalized): Secondary | ICD-10-CM | POA: Diagnosis not present

## 2013-10-21 DIAGNOSIS — M25676 Stiffness of unspecified foot, not elsewhere classified: Secondary | ICD-10-CM | POA: Diagnosis not present

## 2013-10-21 DIAGNOSIS — R609 Edema, unspecified: Secondary | ICD-10-CM | POA: Insufficient documentation

## 2013-10-22 ENCOUNTER — Encounter: Payer: PRIVATE HEALTH INSURANCE | Admitting: Physical Therapy

## 2013-10-22 ENCOUNTER — Ambulatory Visit: Payer: PRIVATE HEALTH INSURANCE | Admitting: Physical Therapy

## 2013-10-22 DIAGNOSIS — Z5189 Encounter for other specified aftercare: Secondary | ICD-10-CM | POA: Diagnosis not present

## 2013-10-23 ENCOUNTER — Encounter: Payer: PRIVATE HEALTH INSURANCE | Admitting: Physical Therapy

## 2013-10-28 ENCOUNTER — Other Ambulatory Visit: Payer: PRIVATE HEALTH INSURANCE

## 2013-10-28 ENCOUNTER — Ambulatory Visit: Payer: PRIVATE HEALTH INSURANCE

## 2013-12-01 ENCOUNTER — Ambulatory Visit: Payer: PRIVATE HEALTH INSURANCE | Admitting: Rehabilitation

## 2013-12-03 ENCOUNTER — Encounter: Payer: PRIVATE HEALTH INSURANCE | Admitting: Rehabilitation

## 2013-12-03 ENCOUNTER — Encounter: Payer: PRIVATE HEALTH INSURANCE | Admitting: Physical Therapy

## 2013-12-08 ENCOUNTER — Encounter: Payer: PRIVATE HEALTH INSURANCE | Admitting: Physical Therapy

## 2013-12-10 ENCOUNTER — Ambulatory Visit: Payer: PRIVATE HEALTH INSURANCE | Attending: Orthopedic Surgery | Admitting: Physical Therapy

## 2013-12-10 DIAGNOSIS — M25673 Stiffness of unspecified ankle, not elsewhere classified: Secondary | ICD-10-CM | POA: Insufficient documentation

## 2013-12-10 DIAGNOSIS — R609 Edema, unspecified: Secondary | ICD-10-CM | POA: Insufficient documentation

## 2013-12-10 DIAGNOSIS — R269 Unspecified abnormalities of gait and mobility: Secondary | ICD-10-CM | POA: Insufficient documentation

## 2013-12-10 DIAGNOSIS — M6281 Muscle weakness (generalized): Secondary | ICD-10-CM | POA: Insufficient documentation

## 2013-12-10 DIAGNOSIS — M25579 Pain in unspecified ankle and joints of unspecified foot: Secondary | ICD-10-CM | POA: Insufficient documentation

## 2013-12-10 DIAGNOSIS — Z5189 Encounter for other specified aftercare: Secondary | ICD-10-CM | POA: Insufficient documentation

## 2013-12-10 DIAGNOSIS — M25676 Stiffness of unspecified foot, not elsewhere classified: Secondary | ICD-10-CM | POA: Diagnosis not present

## 2013-12-17 ENCOUNTER — Other Ambulatory Visit: Payer: Self-pay | Admitting: *Deleted

## 2013-12-17 DIAGNOSIS — C50919 Malignant neoplasm of unspecified site of unspecified female breast: Secondary | ICD-10-CM

## 2013-12-17 MED ORDER — ANASTROZOLE 1 MG PO TABS: 1.0000 mg | ORAL_TABLET | Freq: Every day | ORAL | Status: DC

## 2013-12-23 ENCOUNTER — Encounter: Payer: PRIVATE HEALTH INSURANCE | Admitting: Physical Therapy

## 2013-12-29 ENCOUNTER — Ambulatory Visit: Payer: PRIVATE HEALTH INSURANCE | Admitting: Physical Therapy

## 2013-12-31 ENCOUNTER — Ambulatory Visit: Payer: PRIVATE HEALTH INSURANCE | Admitting: Physical Therapy

## 2014-01-05 ENCOUNTER — Encounter: Payer: PRIVATE HEALTH INSURANCE | Admitting: Physical Therapy

## 2014-01-05 ENCOUNTER — Ambulatory Visit: Payer: PRIVATE HEALTH INSURANCE | Attending: Orthopedic Surgery | Admitting: Physical Therapy

## 2014-01-05 DIAGNOSIS — R269 Unspecified abnormalities of gait and mobility: Secondary | ICD-10-CM | POA: Diagnosis not present

## 2014-01-05 DIAGNOSIS — Z5189 Encounter for other specified aftercare: Secondary | ICD-10-CM | POA: Diagnosis present

## 2014-01-05 DIAGNOSIS — M25579 Pain in unspecified ankle and joints of unspecified foot: Secondary | ICD-10-CM | POA: Diagnosis not present

## 2014-01-05 DIAGNOSIS — M25676 Stiffness of unspecified foot, not elsewhere classified: Secondary | ICD-10-CM | POA: Diagnosis not present

## 2014-01-05 DIAGNOSIS — M25673 Stiffness of unspecified ankle, not elsewhere classified: Secondary | ICD-10-CM | POA: Insufficient documentation

## 2014-01-05 DIAGNOSIS — R609 Edema, unspecified: Secondary | ICD-10-CM | POA: Diagnosis not present

## 2014-01-05 DIAGNOSIS — M6281 Muscle weakness (generalized): Secondary | ICD-10-CM | POA: Diagnosis not present

## 2014-01-07 ENCOUNTER — Ambulatory Visit: Payer: PRIVATE HEALTH INSURANCE | Admitting: Physical Therapy

## 2014-01-07 DIAGNOSIS — Z5189 Encounter for other specified aftercare: Secondary | ICD-10-CM | POA: Diagnosis not present

## 2014-01-14 ENCOUNTER — Ambulatory Visit: Payer: PRIVATE HEALTH INSURANCE | Admitting: Physical Therapy

## 2014-03-16 ENCOUNTER — Other Ambulatory Visit: Payer: Self-pay | Admitting: Nephrology

## 2014-03-16 DIAGNOSIS — N183 Chronic kidney disease, stage 3 unspecified: Secondary | ICD-10-CM

## 2014-03-23 ENCOUNTER — Other Ambulatory Visit: Payer: PRIVATE HEALTH INSURANCE

## 2014-03-30 ENCOUNTER — Ambulatory Visit
Admission: RE | Admit: 2014-03-30 | Discharge: 2014-03-30 | Disposition: A | Discharge status: Home / Self Care | Payer: PRIVATE HEALTH INSURANCE | Source: Ambulatory Visit | Attending: Nephrology | Admitting: Nephrology

## 2014-03-30 DIAGNOSIS — N183 Chronic kidney disease, stage 3 unspecified: Secondary | ICD-10-CM

## 2014-04-21 ENCOUNTER — Other Ambulatory Visit: Payer: Self-pay | Admitting: *Deleted

## 2014-04-21 ENCOUNTER — Telehealth: Payer: Self-pay | Admitting: *Deleted

## 2014-04-21 ENCOUNTER — Encounter: Payer: Self-pay | Admitting: *Deleted

## 2014-04-21 ENCOUNTER — Telehealth: Payer: Self-pay | Admitting: Hematology

## 2014-04-21 DIAGNOSIS — C50919 Malignant neoplasm of unspecified site of unspecified female breast: Secondary | ICD-10-CM

## 2014-04-21 MED ORDER — ANASTROZOLE 1 MG PO TABS: 1.0000 mg | ORAL_TABLET | Freq: Every day | ORAL | Status: DC

## 2014-04-21 NOTE — Telephone Encounter (Signed)
cld & spoke to pt and gave pt appt time &date-pt understood

## 2014-04-21 NOTE — Telephone Encounter (Signed)
NOTIFIED PHARMACY THAT PT. HAS NOT MADE AN APPOINTMENT FOR A PHYSICIAN VISIT SO HER REFILL REQUEST WAS GIVEN TO DAWN STEWART,CLINIC NAVIGATOR. SHE WILL FOLLOW UP WITH PT.

## 2014-04-21 NOTE — Telephone Encounter (Signed)
Left vm for pt to return call to discuss having her come in for f/u. Contact information given.

## 2014-04-28 ENCOUNTER — Ambulatory Visit: Payer: PRIVATE HEALTH INSURANCE | Admitting: Hematology

## 2014-04-28 ENCOUNTER — Other Ambulatory Visit: Payer: PRIVATE HEALTH INSURANCE

## 2014-05-01 ENCOUNTER — Other Ambulatory Visit: Payer: Self-pay | Admitting: *Deleted

## 2014-05-01 DIAGNOSIS — C50919 Malignant neoplasm of unspecified site of unspecified female breast: Secondary | ICD-10-CM

## 2014-05-04 ENCOUNTER — Other Ambulatory Visit: Payer: PRIVATE HEALTH INSURANCE

## 2014-05-04 ENCOUNTER — Telehealth: Payer: Self-pay | Admitting: *Deleted

## 2014-05-04 ENCOUNTER — Ambulatory Visit: Payer: PRIVATE HEALTH INSURANCE | Admitting: Hematology

## 2014-05-04 DIAGNOSIS — C50919 Malignant neoplasm of unspecified site of unspecified female breast: Secondary | ICD-10-CM

## 2014-05-04 MED ORDER — ANASTROZOLE 1 MG PO TABS: 1.0000 mg | ORAL_TABLET | Freq: Every day | ORAL | Status: DC

## 2014-05-04 NOTE — Telephone Encounter (Signed)
Pt called with c/o severe hip pain and unable to come to appt today. R/s appt for 12/30, will call in anastrozole to last until f/u appt with Dr. Burr Medico.  Pt schedule to see Dr. Wynelle Link on Thursday to discuss hip pain. Informed pt that she had to come to f/u on 12/30 with Dr. Burr Medico to receive further medications. Received verbal understanding.

## 2014-05-13 ENCOUNTER — Other Ambulatory Visit: Payer: Self-pay | Admitting: Hematology

## 2014-05-20 ENCOUNTER — Ambulatory Visit (HOSPITAL_BASED_OUTPATIENT_CLINIC_OR_DEPARTMENT_OTHER): Payer: PRIVATE HEALTH INSURANCE | Admitting: Hematology

## 2014-05-20 ENCOUNTER — Other Ambulatory Visit (HOSPITAL_BASED_OUTPATIENT_CLINIC_OR_DEPARTMENT_OTHER): Payer: PRIVATE HEALTH INSURANCE

## 2014-05-20 ENCOUNTER — Telehealth: Payer: Self-pay | Admitting: Hematology

## 2014-05-20 VITALS — BP 153/79 | HR 67 | Temp 98.6°F | Resp 18 | Ht 63.0 in | Wt 137.2 lb

## 2014-05-20 DIAGNOSIS — C50919 Malignant neoplasm of unspecified site of unspecified female breast: Secondary | ICD-10-CM

## 2014-05-20 DIAGNOSIS — Z85038 Personal history of other malignant neoplasm of large intestine: Secondary | ICD-10-CM

## 2014-05-20 DIAGNOSIS — C50811 Malignant neoplasm of overlapping sites of right female breast: Secondary | ICD-10-CM

## 2014-05-20 DIAGNOSIS — C50411 Malignant neoplasm of upper-outer quadrant of right female breast: Secondary | ICD-10-CM

## 2014-05-20 LAB — CBC WITH DIFFERENTIAL/PLATELET
BASO%: 0.8 % (ref 0.0–2.0)
BASOS ABS: 0 10*3/uL (ref 0.0–0.1)
EOS ABS: 0.1 10*3/uL (ref 0.0–0.5)
EOS%: 1.7 % (ref 0.0–7.0)
HCT: 39.5 % (ref 34.8–46.6)
HGB: 12.8 g/dL (ref 11.6–15.9)
LYMPH%: 36.3 % (ref 14.0–49.7)
MCH: 30.2 pg (ref 25.1–34.0)
MCHC: 32.3 g/dL (ref 31.5–36.0)
MCV: 93.6 fL (ref 79.5–101.0)
MONO#: 0.3 10*3/uL (ref 0.1–0.9)
MONO%: 8.6 % (ref 0.0–14.0)
NEUT%: 52.6 % (ref 38.4–76.8)
NEUTROS ABS: 2 10*3/uL (ref 1.5–6.5)
PLATELETS: 235 10*3/uL (ref 145–400)
RBC: 4.23 10*6/uL (ref 3.70–5.45)
RDW: 12.8 % (ref 11.2–14.5)
WBC: 3.8 10*3/uL — ABNORMAL LOW (ref 3.9–10.3)
lymph#: 1.4 10*3/uL (ref 0.9–3.3)

## 2014-05-20 LAB — COMPREHENSIVE METABOLIC PANEL (CC13)
ALBUMIN: 3.9 g/dL (ref 3.5–5.0)
ALK PHOS: 78 U/L (ref 40–150)
ALT: 13 U/L (ref 0–55)
AST: 19 U/L (ref 5–34)
Anion Gap: 9 mEq/L (ref 3–11)
BUN: 25.8 mg/dL (ref 7.0–26.0)
CO2: 30 mEq/L — ABNORMAL HIGH (ref 22–29)
Calcium: 9.8 mg/dL (ref 8.4–10.4)
Chloride: 103 mEq/L (ref 98–109)
Creatinine: 1.6 mg/dL — ABNORMAL HIGH (ref 0.6–1.1)
EGFR: 32 mL/min/{1.73_m2} — ABNORMAL LOW (ref >90)
Glucose: 97 mg/dl (ref 70–140)
Potassium: 3.4 mEq/L — ABNORMAL LOW (ref 3.5–5.1)
SODIUM: 141 meq/L (ref 136–145)
TOTAL PROTEIN: 6.6 g/dL (ref 6.4–8.3)
Total Bilirubin: 0.46 mg/dL (ref 0.20–1.20)

## 2014-05-20 MED ORDER — ANASTROZOLE 1 MG PO TABS: 1.0000 mg | ORAL_TABLET | Freq: Every day | ORAL | Status: DC

## 2014-05-20 NOTE — Telephone Encounter (Signed)
Pt confirmed labs/ov per 12/30 POF, gave pt AVS, also scheduled MM for Jan 2016 and per Ailene Ravel pt is on recall list to be called for f/u in July 2016 for 2 yr colonoscopy, pt will recd a letter in the mail per Ailene Ravel and then called once schedule is up for July for her referral.... KJ sent fax to Dr. Burr Medico from Dr. Paulita Fujita where he did pt's colonoscopy in July 2014.... KJ

## 2014-05-20 NOTE — Progress Notes (Signed)
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 At her final pathology revealed a low grade invasive ductal carcinoma with with 0 of 2 lymph nodes positive for metastatic disease. The tumor was ER positive PR positive HER-2/neu negative.  #2 patient was diagnosed with poorly differentiated invasive adenocarcinoma of colon with patchy mucinous features invading through the muscularis propria into pericolonic fatty tissue 34 pericolonic lymph nodes were negative for metastatic disease( T3 N0), stage II a  PRIOR THERAPY:  #1 patient is status post lumpectomy of the right breast in May 2012 with the final pathology revealing a T1 low grade invasive ductal carcinoma with 2 sentinel nodes negative for metastatic disease tumor was ER positive PR positive HER-2/neu negative. Post lumpectomy patient went on to receive radiation therapy from 12/28/2010 2 01/18/2011 to the right breast. She thereafter received single agent Arimidex 1 mg daily starting on 02/06/2011.  #2 05/01/2011 patient developed generalized weakness shortness of breath and syncopal episode. She had a CT of the head performed that was negative. She had CBC done with that showed a hemoglobin of 6.7. She received 3 units of packed red cells. She was severely iron deficient. FOB was positive. Patient had a GI consult performed during her hospitalization and on 1212 she underwent a colonoscopy that revealed a near obstructing colonic mass worrisome for a hepatic flexure tumor. She had biopsies performed that revealed an adenocarcinoma. She went on to have a right segmental resection performed on 05/05/2011. The final pathology revealed a 5.0 cm poorly differentiated high grade invasive adenocarcinoma with patchy mucinous features invading through the muscularis propria into  pericolonic fat he tissue 34 lymph nodes were negative for metastatic disease. (T3 N0)  CURRENT THERAPY:  1. Breast cancer: arimidex, started on 01/27/2011 2. Colon Cancer: observation  INTERVAL HISTORY: [name] 68 y.o. female returns for followup visit today.  She has worsening left hip pain, and has been seeing Consulting civil engineer. She has some mild knee and feet pain, for which she takes aleve. She is otherwise doing fairly well. She has good energy and appetite, tolerating arimidex well.   MEDICAL HISTORY: Past Medical History  Diagnosis Date  . Hypertension   . Depression   . Back pain, chronic   . Breast cancer 09/09/10  . Cancer of colon 05/24/2011    ALLERGIES:  has No Known Allergies.  MEDICATIONS:  Current Outpatient Prescriptions  Medication Sig Dispense Refill  . ALPRAZolam (XANAX) 0.5 MG tablet Take 0.5 mg by mouth 2 (two) times daily as needed for anxiety.    Marland Kitchen anastrozole (ARIMIDEX) 1 MG tablet Take 1 tablet (1 mg total) by mouth at bedtime. 10 tablet 0  . buPROPion (WELLBUTRIN XL) 300 MG 24 hr tablet Take 300 mg by mouth every morning.     . docusate sodium (COLACE) 100 MG capsule Take 200 mg by mouth at bedtime.    . fluvoxaMINE (LUVOX) 50 MG tablet Take 50-100 mg by mouth 2 (two) times daily. She takes one tablet in the morning and two tablets in the evening.    . naproxen sodium (ANAPROX) 220 MG tablet Take 220 mg by mouth 3 (three) times daily as needed.    . nebivolol (BYSTOLIC) 10 MG tablet Take 10 mg by mouth every morning.     . OxyCODONE HCl ER (OXYCONTIN) 60 MG T12A  Take 60 mg by mouth every 8 (eight) hours.    . Potassium Gluconate 595 MG CAPS Take 595 mg by mouth 2 (two) times daily.      No current facility-administered medications for this visit.    SURGICAL HISTORY:  Past Surgical History  Procedure Laterality Date  . Back surgery    . Shoulder surgery    . Tumor removal  Tumor removed R breast  . Lumbar disc surgery    . Partial  hysterectomy    . Colonoscopy  05/03/2011    Procedure: COLONOSCOPY;  Surgeon: Jeryl Columbia, MD;  Location: [facility] ENDOSCOPY;  Service: Endoscopy;  Laterality: N/A;  . Colon surgery  04/2011  . Breast lumpectomy  10/10/2010    Rt Breast  . Joint replacement      R&L total shoulder replacements    REVIEW OF SYSTEMS:  As in the interim history   PHYSICAL EXAMINATION: Patient is awake alert in no acute distress.   HEENT exam: EOMI PERRLA sclerae anicteric no conjunctival pallor oral mucosa is moist.  Neck: Supple no palpable cervical supraclavicular or axillary adenopathy.  Lungs are clear bilaterally to auscultation and percussion cardiovascular  Cardiovascular: Regular rate rhythm no murmurs gallops or rubs.  Abdomen is soft nontender nondistended bowel sounds are present well-healed surgical scar. There is no hepatosplenomegaly.  Extremities +2 edema there is noted to be some tenderness bilaterally in the calf.  Neuro patient's alert oriented x3 otherwise nonfocal. Patient does has considerable amount of anxiety  Right breast still remains erythematous but it is less swollen in comparison to my exam while she was hospitalized. There is no nipple discharge no inversion or retraction. Left breast no masses or nipple discharge   ECOG PERFORMANCE STATUS: 1 - Symptomatic but completely ambulatory  Blood pressure 153/79, pulse 67, temperature 98.6 F (37 C), temperature source Oral, resp. rate 18, height '5\' 3"'  (1.6 m), weight 137 lb 3.2 oz (62.234 kg), SpO2 98 %.  LABORATORY DATA: Lab Results  Component Value Date   WBC 3.8* 05/20/2014   HGB 12.8 05/20/2014   HCT 39.5 05/20/2014   MCV 93.6 05/20/2014   PLT 235 05/20/2014      Chemistry      Component Value Date/Time   NA 141 05/20/2014 1502   NA 143 02/05/2013 1931   K 3.4* 05/20/2014 1502   K 4.0 02/05/2013 1931   CL 107 02/05/2013 1931   CL 101 04/01/2012 1232   CO2 30* 05/20/2014 1502   CO2 27 01/22/2013 0444   BUN  25.8 05/20/2014 1502   BUN 26* 02/05/2013 1931   CREATININE 1.6* 05/20/2014 1502   CREATININE 1.50* 02/05/2013 1931      Component Value Date/Time   CALCIUM 9.8 05/20/2014 1502   CALCIUM 10.0 01/22/2013 0444   ALKPHOS 78 05/20/2014 1502   ALKPHOS 100 01/20/2013 1800   AST 19 05/20/2014 1502   AST 23 01/20/2013 1800   ALT 13 05/20/2014 1502   ALT 13 01/20/2013 1800   BILITOT 0.46 05/20/2014 1502   BILITOT 0.4 01/20/2013 1800       ASSESSMENT: 68 year old female with 2 primary malignancies:  1.  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  2.   stage II adenocarcinoma of the colon status post segmental resection, S/P surgical resection.  #3 right breast mastitis resolved  #  4 recent left wrist fracture   PLAN:  1. breast cancer: Patient will continue Arimidex 1 mg daily for a duration of 5 years, until Sep 2017 -She is tolerating the treatment very well without significant side effects. -Her last mammogram was in June 2014. I'll schedule her for a mammogram as soon as possible -She has not had a bone density scan for several years, I'll reschedule one for her.  -She will continue calcium and vitamin D supplement.  #2 stage II adenocarcinoma of the colon:  -Adjuvant chemotherapy was not recommended. -Continue follow-up. I'll check her CEA level.   Follow up: I recommend her follow-up in 6 months, however she does not want close follow-up. I'll schedule her next visit in one year.  All questions were answered. The patient knows to call the clinic with any problems, questions or concerns. We can certainly see the patient much sooner if necessary.  I spent 20 minutes counseling the patient face to face. The total time spent in the appointment was 25 minutes.   Truitt Merle 05/20/2014

## 2014-05-22 ENCOUNTER — Encounter: Payer: Self-pay | Admitting: Hematology

## 2014-05-26 ENCOUNTER — Telehealth: Payer: Self-pay | Admitting: *Deleted

## 2014-05-26 NOTE — Telephone Encounter (Signed)
Nurse was able to obtain a copy of bone density scan done in May 2015 from pt's primary  Priscille Loveless, NP @ [facility].  Dr. Burr Medico reviewed results.  Spoke with pt and informed pt that she does not need another bone density scan done, just mammogram scheduled for 06/02/14 only.   Pt voiced understanding.  Appt cancelled in EPIC.

## 2014-06-01 DIAGNOSIS — M7062 Trochanteric bursitis, left hip: Secondary | ICD-10-CM | POA: Diagnosis not present

## 2014-06-02 ENCOUNTER — Ambulatory Visit
Admission: RE | Admit: 2014-06-02 | Discharge: 2014-06-02 | Disposition: A | Discharge status: Home / Self Care | Payer: Medicare Other | Source: Ambulatory Visit | Attending: Hematology | Admitting: Hematology

## 2014-06-02 DIAGNOSIS — N6489 Other specified disorders of breast: Secondary | ICD-10-CM | POA: Diagnosis not present

## 2014-06-02 DIAGNOSIS — C50919 Malignant neoplasm of unspecified site of unspecified female breast: Secondary | ICD-10-CM

## 2014-06-08 ENCOUNTER — Other Ambulatory Visit: Payer: Medicaid Other

## 2014-06-08 DIAGNOSIS — I129 Hypertensive chronic kidney disease with stage 1 through stage 4 chronic kidney disease, or unspecified chronic kidney disease: Secondary | ICD-10-CM | POA: Diagnosis not present

## 2014-06-08 DIAGNOSIS — G2581 Restless legs syndrome: Secondary | ICD-10-CM | POA: Diagnosis not present

## 2014-06-08 DIAGNOSIS — N183 Chronic kidney disease, stage 3 (moderate): Secondary | ICD-10-CM | POA: Diagnosis not present

## 2014-06-09 DIAGNOSIS — M7072 Other bursitis of hip, left hip: Secondary | ICD-10-CM | POA: Diagnosis not present

## 2014-06-18 DIAGNOSIS — M961 Postlaminectomy syndrome, not elsewhere classified: Secondary | ICD-10-CM | POA: Diagnosis not present

## 2014-06-18 DIAGNOSIS — M25559 Pain in unspecified hip: Secondary | ICD-10-CM | POA: Diagnosis not present

## 2014-06-18 DIAGNOSIS — G894 Chronic pain syndrome: Secondary | ICD-10-CM | POA: Diagnosis not present

## 2014-06-18 DIAGNOSIS — Z79891 Long term (current) use of opiate analgesic: Secondary | ICD-10-CM | POA: Diagnosis not present

## 2014-07-14 DIAGNOSIS — M533 Sacrococcygeal disorders, not elsewhere classified: Secondary | ICD-10-CM | POA: Diagnosis not present

## 2014-08-17 ENCOUNTER — Encounter (HOSPITAL_BASED_OUTPATIENT_CLINIC_OR_DEPARTMENT_OTHER): Payer: Self-pay | Admitting: *Deleted

## 2014-08-17 ENCOUNTER — Emergency Department (HOSPITAL_BASED_OUTPATIENT_CLINIC_OR_DEPARTMENT_OTHER)
Admission: EM | Admit: 2014-08-17 | Discharge: 2014-08-18 | Disposition: A | Discharge status: Home / Self Care | Payer: Medicare Other | Attending: Emergency Medicine | Admitting: Emergency Medicine

## 2014-08-17 ENCOUNTER — Emergency Department (HOSPITAL_BASED_OUTPATIENT_CLINIC_OR_DEPARTMENT_OTHER): Payer: Medicare Other

## 2014-08-17 DIAGNOSIS — M961 Postlaminectomy syndrome, not elsewhere classified: Secondary | ICD-10-CM | POA: Diagnosis not present

## 2014-08-17 DIAGNOSIS — S20229A Contusion of unspecified back wall of thorax, initial encounter: Secondary | ICD-10-CM | POA: Insufficient documentation

## 2014-08-17 DIAGNOSIS — Y9389 Activity, other specified: Secondary | ICD-10-CM | POA: Diagnosis not present

## 2014-08-17 DIAGNOSIS — E876 Hypokalemia: Secondary | ICD-10-CM | POA: Diagnosis not present

## 2014-08-17 DIAGNOSIS — Y998 Other external cause status: Secondary | ICD-10-CM | POA: Insufficient documentation

## 2014-08-17 DIAGNOSIS — Z87891 Personal history of nicotine dependence: Secondary | ICD-10-CM | POA: Diagnosis not present

## 2014-08-17 DIAGNOSIS — Z791 Long term (current) use of non-steroidal anti-inflammatories (NSAID): Secondary | ICD-10-CM | POA: Diagnosis not present

## 2014-08-17 DIAGNOSIS — Y9289 Other specified places as the place of occurrence of the external cause: Secondary | ICD-10-CM | POA: Insufficient documentation

## 2014-08-17 DIAGNOSIS — Z79899 Other long term (current) drug therapy: Secondary | ICD-10-CM | POA: Diagnosis not present

## 2014-08-17 DIAGNOSIS — F329 Major depressive disorder, single episode, unspecified: Secondary | ICD-10-CM | POA: Insufficient documentation

## 2014-08-17 DIAGNOSIS — Z853 Personal history of malignant neoplasm of breast: Secondary | ICD-10-CM | POA: Insufficient documentation

## 2014-08-17 DIAGNOSIS — M25559 Pain in unspecified hip: Secondary | ICD-10-CM | POA: Diagnosis not present

## 2014-08-17 DIAGNOSIS — S199XXA Unspecified injury of neck, initial encounter: Secondary | ICD-10-CM | POA: Diagnosis not present

## 2014-08-17 DIAGNOSIS — I10 Essential (primary) hypertension: Secondary | ICD-10-CM | POA: Diagnosis not present

## 2014-08-17 DIAGNOSIS — S40012A Contusion of left shoulder, initial encounter: Secondary | ICD-10-CM | POA: Insufficient documentation

## 2014-08-17 DIAGNOSIS — S8011XA Contusion of right lower leg, initial encounter: Secondary | ICD-10-CM | POA: Diagnosis not present

## 2014-08-17 DIAGNOSIS — S0990XA Unspecified injury of head, initial encounter: Secondary | ICD-10-CM | POA: Insufficient documentation

## 2014-08-17 DIAGNOSIS — G8929 Other chronic pain: Secondary | ICD-10-CM | POA: Insufficient documentation

## 2014-08-17 DIAGNOSIS — S9031XA Contusion of right foot, initial encounter: Secondary | ICD-10-CM | POA: Insufficient documentation

## 2014-08-17 DIAGNOSIS — S9032XA Contusion of left foot, initial encounter: Secondary | ICD-10-CM | POA: Diagnosis not present

## 2014-08-17 DIAGNOSIS — T148 Other injury of unspecified body region: Secondary | ICD-10-CM | POA: Diagnosis not present

## 2014-08-17 DIAGNOSIS — Z85038 Personal history of other malignant neoplasm of large intestine: Secondary | ICD-10-CM | POA: Insufficient documentation

## 2014-08-17 DIAGNOSIS — R55 Syncope and collapse: Secondary | ICD-10-CM | POA: Diagnosis not present

## 2014-08-17 DIAGNOSIS — R296 Repeated falls: Secondary | ICD-10-CM | POA: Diagnosis not present

## 2014-08-17 DIAGNOSIS — S9001XA Contusion of right ankle, initial encounter: Secondary | ICD-10-CM | POA: Diagnosis not present

## 2014-08-17 DIAGNOSIS — S0511XA Contusion of eyeball and orbital tissues, right eye, initial encounter: Secondary | ICD-10-CM | POA: Diagnosis not present

## 2014-08-17 DIAGNOSIS — T07XXXA Unspecified multiple injuries, initial encounter: Secondary | ICD-10-CM

## 2014-08-17 DIAGNOSIS — R109 Unspecified abdominal pain: Secondary | ICD-10-CM | POA: Diagnosis not present

## 2014-08-17 DIAGNOSIS — S7001XA Contusion of right hip, initial encounter: Secondary | ICD-10-CM | POA: Diagnosis not present

## 2014-08-17 DIAGNOSIS — S0093XA Contusion of unspecified part of head, initial encounter: Secondary | ICD-10-CM | POA: Diagnosis not present

## 2014-08-17 DIAGNOSIS — Z79891 Long term (current) use of opiate analgesic: Secondary | ICD-10-CM | POA: Diagnosis not present

## 2014-08-17 DIAGNOSIS — M79671 Pain in right foot: Secondary | ICD-10-CM | POA: Diagnosis not present

## 2014-08-17 DIAGNOSIS — W01198A Fall on same level from slipping, tripping and stumbling with subsequent striking against other object, initial encounter: Secondary | ICD-10-CM | POA: Diagnosis not present

## 2014-08-17 DIAGNOSIS — G894 Chronic pain syndrome: Secondary | ICD-10-CM | POA: Diagnosis not present

## 2014-08-17 DIAGNOSIS — W19XXXA Unspecified fall, initial encounter: Secondary | ICD-10-CM

## 2014-08-17 LAB — CBC WITH DIFFERENTIAL/PLATELET
Basophils Absolute: 0 10*3/uL (ref 0.0–0.1)
Basophils Relative: 0 % (ref 0–1)
Eosinophils Absolute: 0.1 10*3/uL (ref 0.0–0.7)
Eosinophils Relative: 1 % (ref 0–5)
HEMATOCRIT: 37.1 % (ref 36.0–46.0)
Hemoglobin: 12.5 g/dL (ref 12.0–15.0)
LYMPHS PCT: 34 % (ref 12–46)
Lymphs Abs: 1.7 10*3/uL (ref 0.7–4.0)
MCH: 29.5 pg (ref 26.0–34.0)
MCHC: 33.7 g/dL (ref 30.0–36.0)
MCV: 87.5 fL (ref 78.0–100.0)
Monocytes Absolute: 0.7 10*3/uL (ref 0.1–1.0)
Monocytes Relative: 13 % — ABNORMAL HIGH (ref 3–12)
NEUTROS ABS: 2.5 10*3/uL (ref 1.7–7.7)
Neutrophils Relative %: 52 % (ref 43–77)
PLATELETS: 233 10*3/uL (ref 150–400)
RBC: 4.24 MIL/uL (ref 3.87–5.11)
RDW: 12.4 % (ref 11.5–15.5)
WBC: 4.9 10*3/uL (ref 4.0–10.5)

## 2014-08-17 LAB — RAPID URINE DRUG SCREEN, HOSP PERFORMED
AMPHETAMINES: NOT DETECTED
BENZODIAZEPINES: NOT DETECTED
Barbiturates: NOT DETECTED
Cocaine: NOT DETECTED
Opiates: POSITIVE — AB
Tetrahydrocannabinol: NOT DETECTED

## 2014-08-17 LAB — URINALYSIS, ROUTINE W REFLEX MICROSCOPIC
BILIRUBIN URINE: NEGATIVE
GLUCOSE, UA: NEGATIVE mg/dL
Hgb urine dipstick: NEGATIVE
KETONES UR: NEGATIVE mg/dL
Leukocytes, UA: NEGATIVE
NITRITE: NEGATIVE
Protein, ur: NEGATIVE mg/dL
Specific Gravity, Urine: 1.016 (ref 1.005–1.030)
Urobilinogen, UA: 1 mg/dL (ref 0.0–1.0)
pH: 5.5 (ref 5.0–8.0)

## 2014-08-17 LAB — COMPREHENSIVE METABOLIC PANEL
ALBUMIN: 4 g/dL (ref 3.5–5.2)
ALT: 32 U/L (ref 0–35)
ANION GAP: 8 (ref 5–15)
AST: 35 U/L (ref 0–37)
Alkaline Phosphatase: 89 U/L (ref 39–117)
BILIRUBIN TOTAL: 0.3 mg/dL (ref 0.3–1.2)
BUN: 24 mg/dL — AB (ref 6–23)
CHLORIDE: 101 mmol/L (ref 96–112)
CO2: 31 mmol/L (ref 19–32)
Calcium: 9.4 mg/dL (ref 8.4–10.5)
Creatinine, Ser: 1.35 mg/dL — ABNORMAL HIGH (ref 0.50–1.10)
GFR calc Af Amer: 46 mL/min — ABNORMAL LOW (ref >90)
GFR calc non Af Amer: 40 mL/min — ABNORMAL LOW (ref >90)
Glucose, Bld: 113 mg/dL — ABNORMAL HIGH (ref 70–99)
POTASSIUM: 2.7 mmol/L — AB (ref 3.5–5.1)
Sodium: 140 mmol/L (ref 135–145)
TOTAL PROTEIN: 6.3 g/dL (ref 6.0–8.3)

## 2014-08-17 LAB — TROPONIN I: Troponin I: <0.03 ng/mL (ref <0.031)

## 2014-08-17 LAB — APTT: aPTT: 34 seconds (ref 24–37)

## 2014-08-17 LAB — ETHANOL: Alcohol, Ethyl (B): <5 mg/dL (ref 0–9)

## 2014-08-17 LAB — PROTIME-INR
INR: 1 (ref 0.00–1.49)
PROTHROMBIN TIME: 13.2 s (ref 11.6–15.2)

## 2014-08-17 MED ORDER — SODIUM CHLORIDE 0.9 % IV BOLUS (SEPSIS)
1000.0000 mL | Freq: Once | INTRAVENOUS | Status: AC
  Administered 2014-08-17: 1000 mL via INTRAVENOUS

## 2014-08-17 MED ORDER — POTASSIUM CHLORIDE CRYS ER 20 MEQ PO TBCR
40.0000 meq | EXTENDED_RELEASE_TABLET | Freq: Once | ORAL | Status: AC
  Administered 2014-08-17: 40 meq via ORAL
  Filled 2014-08-17: qty 2

## 2014-08-17 MED ORDER — POTASSIUM CHLORIDE 10 MEQ/100ML IV SOLN
10.0000 meq | INTRAVENOUS | Status: AC
  Administered 2014-08-17 (×2): 10 meq via INTRAVENOUS
  Filled 2014-08-17 (×2): qty 100

## 2014-08-17 MED ORDER — POTASSIUM CHLORIDE ER 10 MEQ PO TBCR: 10.0000 meq | EXTENDED_RELEASE_TABLET | Freq: Two times a day (BID) | ORAL | Status: DC

## 2014-08-17 MED ORDER — IOHEXOL 300 MG/ML  SOLN
80.0000 mL | Freq: Once | INTRAMUSCULAR | Status: AC | PRN
  Administered 2014-08-17: 80 mL via INTRAVENOUS

## 2014-08-17 NOTE — ED Notes (Signed)
Walked pt. To restroom.  Pt. Walks with semi steady gait and is alert and oriented with no distress noted.

## 2014-08-17 NOTE — ED Provider Notes (Addendum)
TIME SEEN: 5:39PM  CHIEF COMPLAINT: Fall  HPI Comments: [name] is a 69 y.o. female with a history of HTN, depression, chronic back pain, breast and colon CA who presents to the Emergency Department complaining of recurrent falling that started approximately 3 weeks ago when she was laying down new carpet and tripped. Pt states she tripped and struck her face and knee. She states that she has been having "panic attacks" for the last 2 weeks, more frequently at night. Pt describes these as episodes where she would "black out", lose consciousness and wake up on the floor and not remember how she got where she was or what she had been doing beforehand. She can't recall how many times she has fallen in the past 3 weeks but it has been over 5 times.  Pt states that she sometimes will feel dizzy that she describes as a lightheadedness and room-spinning before she experiences a fall and LOC but not every time. Her daughter states that pt has been falling about once daily and sometimes will fall from a standing position. Pt states that her falling episodes have not been witnessed. Pt reports she is on no anticoagulation medications. She denies any history of PE or DVT. No history of CAD or CHF. No history of CVA. No history of seizure. Patient does see a pain management specialist and is on OxyContin but state they recently decreased her medication. She states she has not been taking any extra of her medication. She denies CP, vomiting, diarrhea, fever, cough, blood in stool, black and tarry stool, dysuria, hematuria, SOB, extremity numbness or facial numbness.   Denies any abuse at home.  ROS: See HPI Constitutional: no fever  Eyes: no drainage  ENT: no runny nose   Cardiovascular:  no chest pain  Resp: no SOB  GI: no vomiting GU: no dysuria Integumentary: no rash  Allergy: no hives  Musculoskeletal: no leg swelling  Neurological: no slurred speech, + syncope, dizziness, lightheadedness ROS  otherwise negative  PAST MEDICAL HISTORY/PAST SURGICAL HISTORY:  Past Medical History  Diagnosis Date  . Hypertension   . Depression   . Back pain, chronic   . Breast cancer 09/09/10  . Cancer of colon 05/24/2011    MEDICATIONS:  Prior to Admission medications   Medication Sig Start Date End Date Taking? Authorizing Provider  ALPRAZolam Duanne Moron) 0.5 MG tablet Take 0.5 mg by mouth 2 (two) times daily as needed for anxiety.    Historical Provider, MD  anastrozole (ARIMIDEX) 1 MG tablet Take 1 tablet (1 mg total) by mouth at bedtime. 05/20/14   Truitt Merle, MD  buPROPion (WELLBUTRIN XL) 300 MG 24 hr tablet Take 300 mg by mouth every morning.     Historical Provider, MD  docusate sodium (COLACE) 100 MG capsule Take 200 mg by mouth at bedtime.    Historical Provider, MD  fluvoxaMINE (LUVOX) 50 MG tablet Take 50-100 mg by mouth 2 (two) times daily. She takes one tablet in the morning and two tablets in the evening.    Historical Provider, MD  naproxen sodium (ANAPROX) 220 MG tablet Take 220 mg by mouth 3 (three) times daily as needed.    Historical Provider, MD  nebivolol (BYSTOLIC) 10 MG tablet Take 10 mg by mouth every morning.     Historical Provider, MD  OxyCODONE HCl ER (OXYCONTIN) 60 MG T12A Take 60 mg by mouth every 8 (eight) hours.    Historical Provider, MD  Potassium Gluconate 595 MG CAPS Take  595 mg by mouth 2 (two) times daily.     Historical Provider, MD    ALLERGIES:  No Known Allergies  SOCIAL HISTORY:  History  Substance Use Topics  . Smoking status: Former Smoker -- 1.00 packs/day    Types: Cigarettes  . Smokeless tobacco: Former Systems developer    Quit date: 04/22/2010  . Alcohol Use: No    FAMILY HISTORY: Family History  Problem Relation Age of Onset  . Diabetes Mother   . Hypertension Mother   . Cancer Mother     leukemia  . Sudden death Mother   . Anesthesia problems Neg Hx   . Hypotension Neg Hx   . Malignant hyperthermia Neg Hx   . Pseudochol deficiency Neg Hx      EXAM: BP 148/66 mmHg  Pulse 72  Temp(Src) 98 F (36.7 C) (Oral)  Resp 20  Ht 5\' 3"  (1.6 m)  Wt 137 lb (62.143 kg)  BMI 24.27 kg/m2  SpO2 97% CONSTITUTIONAL: Alert and oriented and responds appropriately to questions. Well-appearing; well-nourished; GCS 15 HEAD: Normocephalic; multiple significant areas of bruising diffusely across her entire face EYES: Conjunctivae clear, PERRL, EOMI, no hyphema or subconjunctival hemorrhage ENT: normal nose; no rhinorrhea; moist mucous membranes; pharynx without lesions noted; no dental injury; no hemotypanum; no septal hematoma, TMs are clear bilaterally NECK: Supple, no meningismus, no LAD; no midline spinal tenderness, step-off or deformity,  CARD: RRR; S1 and S2 appreciated; no murmurs, no clicks, no rubs, no gallops RESP: Normal chest excursion without splinting or tachypnea; breath sounds clear and equal bilaterally; no wheezes, no rhonchi, no rales; chest wall stable, nontender to palpation ABD/GI: Normal bowel sounds; non-distended; soft, non-tender, no rebound, no guarding PELVIS:  stable, tender to palpation over the right hip with bruising over the right anterior femur with no leg length discrepancy BACK:  The back appears normal and is non-tender to palpation, there is no CVA tenderness; no midline spinal tenderness, step-off or deformity, patient is kyphotic and has multiple areas of bruising around her thoracic and lumbar spine EXT: Tender to palpation diffusely over the left humerus with significant ecchymosis and no bony deformity, tender to palpation also over the right humerus without ecchymosis, tender to palpation diffusely throughout the left lower extremity including the hip, knee, distal tibia, ankle and dorsal foot without any deformity or joint effusion; patient also is diffusely tender throughout the right lower extremity including the hip, knee, proximal fibula, ankle and foot with multiple areas of ecchymosis and no obvious bony  deformity or swelling, Normal ROM in all joints; otherwise extremities are non-tender to palpation; no edema; normal capillary refill; no cyanosis, 2+ radial and DP pulses bilaterally, compartments soft    SKIN: Normal color for age and race; warm NEURO: Moves all extremities equally, sensation to light touch intact diffusely, cranial nerves II through XII intact, patient is able to stand and walk but will intermittently feel like she is going to fall forward PSYCH: The patient's mood and manner are appropriate. Grooming and personal hygiene are appropriate.  MEDICAL DECISION MAKING: Patient here with multiple syncopal events. These may be seizures versus vertigo from a peripheral or central cause. Denies any chest pain or shortness of breath prior to these episodes she does have a history of hypertension as well as tobacco use. Her EKG shows PVCs but no other ischemic changes. We'll obtain labs, urine, trauma CT scans, x-rays of the left humerus and right leg. She denies wanting any pain medicine at this time.  We'll give IV fluids. We'll check orthostatic vital signs. Have recommended admission for further workup including echocardiogram, MRI of her brain.  ED PROGRESS: Patient's labs show hypokalemia with potassium of 2.7. She has received oral potassium and IV potassium. EKG showed no changes. Otherwise her labs, urine are unremarkable. Extremity x-rays and CT scan show no acute injury other than possible right great toe fracture but she has no tenderness over the right great toe and this is likely old in nature. Patient poor she has had a previous right great toe fracture in the past. Have offered patient admission multiple times given multiple syncopal events for further workup and she adamantly refuses. States she'll follow-up with her primary care physician. Given she has had the symptoms for 3 weeks I do not feel at this time she necessarily has to have an emergent workup and can follow up with her  primary care provider. Have discussed return precautions. Patient and her friend at bedside are comfortable with plan. Second troponin pending. If negative, will discharge home.       EKG Interpretation  Date/Time:  Monday August 17 2014 17:59:21 EDT Ventricular Rate:  71 PR Interval:  132 QRS Duration: 100 QT Interval:  418 QTC Calculation: [id number] R Axis:   42 Text Interpretation:  Sinus rhythm with occasional Premature ventricular complexes Otherwise normal ECG No significant change since last tracing Confirmed by Therasa Lorenzi,  DO, Madiline Saffran [id number]) on 08/17/2014 6:04:28 PM        I personally performed the services described in this documentation, which was scribed in my presence. The recorded information has been reviewed and is accurate.   [place], DO 08/17/14 [place], DO 08/17/14 [id number]

## 2014-08-17 NOTE — ED Notes (Signed)
Pt. Reports she has been falling for approx. 2 week.  Pt. Reports she takes meds for chronic pain and is a cancer survivor.  Pt. Has noted bruising on her face over the and under and around the R eye.  Under and around the R shoulder and R upper arm.  Pt. Has noted bruising on the L top foot with noted edema and redness.   Bruising and edema up the L shin and the R leg.  Pt. Has bruising on the buttock region and her back.

## 2014-08-17 NOTE — Discharge Instructions (Signed)
Fall Prevention and Home Safety Falls cause injuries and can affect all age groups. It is possible to use preventive measures to significantly decrease the likelihood of falls. There are many simple measures which can make your home safer and prevent falls. OUTDOORS  Repair cracks and edges of walkways and driveways.  Remove high doorway thresholds.  Trim shrubbery on the main path into your home.  Have good outside lighting.  Clear walkways of tools, rocks, debris, and clutter.  Check that handrails are not broken and are securely fastened. Both sides of steps should have handrails.  Have leaves, snow, and ice cleared regularly.  Use sand or salt on walkways during winter months.  In the garage, clean up grease or oil spills. BATHROOM  Install night lights.  Install grab bars by the toilet and in the tub and shower.  Use non-skid mats or decals in the tub or shower.  Place a plastic non-slip stool in the shower to sit on, if needed.  Keep floors dry and clean up all water on the floor immediately.  Remove soap buildup in the tub or shower on a regular basis.  Secure bath mats with non-slip, double-sided rug tape.  Remove throw rugs and tripping hazards from the floors. BEDROOMS  Install night lights.  Make sure a bedside light is easy to reach.  Do not use oversized bedding.  Keep a telephone by your bedside.  Have a firm chair with side arms to use for getting dressed.  Remove throw rugs and tripping hazards from the floor. KITCHEN  Keep handles on pots and pans turned toward the center of the stove. Use back burners when possible.  Clean up spills quickly and allow time for drying.  Avoid walking on wet floors.  Avoid hot utensils and knives.  Position shelves so they are not too high or low.  Place commonly used objects within easy reach.  If necessary, use a sturdy step stool with a grab bar when reaching.  Keep electrical cables out of the  way.  Do not use floor polish or wax that makes floors slippery. If you must use wax, use non-skid floor wax.  Remove throw rugs and tripping hazards from the floor. STAIRWAYS  Never leave objects on stairs.  Place handrails on both sides of stairways and use them. Fix any loose handrails. Make sure handrails on both sides of the stairways are as long as the stairs.  Check carpeting to make sure it is firmly attached along stairs. Make repairs to worn or loose carpet promptly.  Avoid placing throw rugs at the top or bottom of stairways, or properly secure the rug with carpet tape to prevent slippage. Get rid of throw rugs, if possible.  Have an electrician put in a light switch at the top and bottom of the stairs. OTHER FALL PREVENTION TIPS  Wear low-heel or rubber-soled shoes that are supportive and fit well. Wear closed toe shoes.  When using a stepladder, make sure it is fully opened and both spreaders are firmly locked. Do not climb a closed stepladder.  Add color or contrast paint or tape to grab bars and handrails in your home. Place contrasting color strips on first and last steps.  Learn and use mobility aids as needed. Install an electrical emergency response system.  Turn on lights to avoid dark areas. Replace light bulbs that burn out immediately. Get light switches that glow.  Arrange furniture to create clear pathways. Keep furniture in the same place.  Firmly attach carpet with non-skid or double-sided tape.  Eliminate uneven floor surfaces.  Select a carpet pattern that does not visually hide the edge of steps.  Be aware of all pets. OTHER HOME SAFETY TIPS  Set the water temperature for 120 F (48.8 C).  Keep emergency numbers on or near the telephone.  Keep smoke detectors on every level of the home and near sleeping areas. Document Released: 04/28/2002 Document Revised: 11/07/2011 Document Reviewed: 07/28/2011 [facility] Patient Information 2015  [place], [place]. This information is not intended to replace advice given to you by your health care provider. Make sure you discuss any questions you have with your health care provider.  Syncope Syncope is a medical term for fainting or passing out. This means you lose consciousness and drop to the ground. People are generally unconscious for less than 5 minutes. You may have some muscle twitches for up to 15 seconds before waking up and returning to normal. Syncope occurs more often in older adults, but it can happen to anyone. While most causes of syncope are not dangerous, syncope can be a sign of a serious medical problem. It is important to seek medical care.  CAUSES  Syncope is caused by a sudden drop in blood flow to the brain. The specific cause is often not determined. Factors that can bring on syncope include:  Taking medicines that lower blood pressure.  Sudden changes in posture, such as standing up quickly.  Taking more medicine than prescribed.  Standing in one place for too long.  Seizure disorders.  Dehydration and excessive exposure to heat.  Low blood sugar (hypoglycemia).  Straining to have a bowel movement.  Heart disease, irregular heartbeat, or other circulatory problems.  Fear, emotional distress, seeing blood, or severe pain. SYMPTOMS  Right before fainting, you may:  Feel dizzy or light-headed.  Feel nauseous.  See all white or all black in your field of vision.  Have cold, clammy skin. DIAGNOSIS  Your health care provider will ask about your symptoms, perform a physical exam, and perform an electrocardiogram (ECG) to record the electrical activity of your heart. Your health care provider may also perform other heart or blood tests to determine the cause of your syncope which may include:  Transthoracic echocardiogram (TTE). During echocardiography, sound waves are used to evaluate how blood flows through your heart.  Transesophageal echocardiogram  (TEE).  Cardiac monitoring. This allows your health care provider to monitor your heart rate and rhythm in real time.  Holter monitor. This is a portable device that records your heartbeat and can help diagnose heart arrhythmias. It allows your health care provider to track your heart activity for several days, if needed.  Stress tests by exercise or by giving medicine that makes the heart beat faster. TREATMENT  In most cases, no treatment is needed. Depending on the cause of your syncope, your health care provider may recommend changing or stopping some of your medicines. HOME CARE INSTRUCTIONS  Have someone stay with you until you feel stable.  Do not drive, use machinery, or play sports until your health care provider says it is okay.  Keep all follow-up appointments as directed by your health care provider.  Lie down right away if you start feeling like you might faint. Breathe deeply and steadily. Wait until all the symptoms have passed.  Drink enough fluids to keep your urine clear or pale yellow.  If you are taking blood pressure or heart medicine, get up slowly and take several  minutes to sit and then stand. This can reduce dizziness. SEEK IMMEDIATE MEDICAL CARE IF:   You have a severe headache.  You have unusual pain in the chest, abdomen, or back.  You are bleeding from your mouth or rectum, or you have black or tarry stool.  You have an irregular or very fast heartbeat.  You have pain with breathing.  You have repeated fainting or seizure-like jerking during an episode.  You faint when sitting or lying down.  You have confusion.  You have trouble walking.  You have severe weakness.  You have vision problems. If you fainted, call your local emergency services (911 in [place] Do not drive yourself to the hospital.  MAKE SURE YOU:  Understand these instructions.  Will watch your condition.  Will get help right away if you are not doing well or get  worse. Document Released: 05/08/2005 Document Revised: 05/13/2013 Document Reviewed: 07/07/2011 [facility] Patient Information 2015 [place], [place]. This information is not intended to replace advice given to you by your health care provider. Make sure you discuss any questions you have with your health care provider. Head Injury You have received a head injury. It does not appear serious at this time. Headaches and vomiting are common following head injury. It should be easy to awaken from sleeping. Sometimes it is necessary for you to stay in the emergency department for a while for observation. Sometimes admission to the hospital may be needed. After injuries such as yours, most problems occur within the first 24 hours, but side effects may occur up to 7-10 days after the injury. It is important for you to carefully monitor your condition and contact your health care provider or seek immediate medical care if there is a change in your condition. WHAT ARE THE TYPES OF HEAD INJURIES? Head injuries can be as minor as a bump. Some head injuries can be more severe. More severe head injuries include:  A jarring injury to the brain (concussion).  A bruise of the brain (contusion). This mean there is bleeding in the brain that can cause swelling.  A cracked skull (skull fracture).  Bleeding in the brain that collects, clots, and forms a bump (hematoma). WHAT CAUSES A HEAD INJURY? A serious head injury is most likely to happen to someone who is in a car wreck and is not wearing a seat belt. Other causes of major head injuries include bicycle or motorcycle accidents, sports injuries, and falls. HOW ARE HEAD INJURIES DIAGNOSED? A complete history of the event leading to the injury and your current symptoms will be helpful in diagnosing head injuries. Many times, pictures of the brain, such as CT or MRI are needed to see the extent of the injury. Often, an overnight hospital stay is necessary for observation.   WHEN SHOULD I SEEK IMMEDIATE MEDICAL CARE?  You should get help right away if:  You have confusion or drowsiness.  You feel sick to your stomach (nauseous) or have continued, forceful vomiting.  You have dizziness or unsteadiness that is getting worse.  You have severe, continued headaches not relieved by medicine. Only take over-the-counter or prescription medicines for pain, fever, or discomfort as directed by your health care provider.  You do not have normal function of the arms or legs or are unable to walk.  You notice changes in the black spots in the center of the colored part of your eye (pupil).  You have a clear or bloody fluid coming from your nose or  ears.  You have a loss of vision. During the next 24 hours after the injury, you must stay with someone who can watch you for the warning signs. This person should contact local emergency services (911 in the [place] if you have seizures, you become unconscious, or you are unable to wake up. HOW CAN I PREVENT A HEAD INJURY IN THE FUTURE? The most important factor for preventing major head injuries is avoiding motor vehicle accidents. To minimize the potential for damage to your head, it is crucial to wear seat belts while riding in motor vehicles. Wearing helmets while bike riding and playing collision sports (like football) is also helpful. Also, avoiding dangerous activities around the house will further help reduce your risk of head injury.  WHEN CAN I RETURN TO NORMAL ACTIVITIES AND ATHLETICS? You should be reevaluated by your health care provider before returning to these activities. If you have any of the following symptoms, you should not return to activities or contact sports until 1 week after the symptoms have stopped:  Persistent headache.  Dizziness or vertigo.  Poor attention and concentration.  Confusion.  Memory problems.  Nausea or vomiting.  Fatigue or tire easily.  Irritability.  Intolerant of  bright lights or loud noises.  Anxiety or depression.  Disturbed sleep. MAKE SURE YOU:   Understand these instructions.  Will watch your condition.  Will get help right away if you are not doing well or get worse. Document Released: 05/08/2005 Document Revised: 05/13/2013 Document Reviewed: 01/13/2013 [facility] Patient Information 2015 [place], [place]. This information is not intended to replace advice given to you by your health care provider. Make sure you discuss any questions you have with your health care provider.   Contusion A contusion is a deep bruise. Contusions are the result of an injury that caused bleeding under the skin. The contusion may turn blue, purple, or yellow. Minor injuries will give you a painless contusion, but more severe contusions may stay painful and swollen for a few weeks.  CAUSES  A contusion is usually caused by a blow, trauma, or direct force to an area of the body. SYMPTOMS   Swelling and redness of the injured area.  Bruising of the injured area.  Tenderness and soreness of the injured area.  Pain. DIAGNOSIS  The diagnosis can be made by taking a history and physical exam. An X-ray, CT scan, or MRI may be needed to determine if there were any associated injuries, such as fractures. TREATMENT  Specific treatment will depend on what area of the body was injured. In general, the best treatment for a contusion is resting, icing, elevating, and applying cold compresses to the injured area. Over-the-counter medicines may also be recommended for pain control. Ask your caregiver what the best treatment is for your contusion. HOME CARE INSTRUCTIONS   Put ice on the injured area.  Put ice in a plastic bag.  Place a towel between your skin and the bag.  Leave the ice on for 15-20 minutes, 3-4 times a day, or as directed by your health care provider.  Only take over-the-counter or prescription medicines for pain, discomfort, or fever as directed by  your caregiver. Your caregiver may recommend avoiding anti-inflammatory medicines (aspirin, ibuprofen, and naproxen) for 48 hours because these medicines may increase bruising.  Rest the injured area.  If possible, elevate the injured area to reduce swelling. SEEK IMMEDIATE MEDICAL CARE IF:   You have increased bruising or swelling.  You have pain that is  getting worse.  Your swelling or pain is not relieved with medicines. MAKE SURE YOU:   Understand these instructions.  Will watch your condition.  Will get help right away if you are not doing well or get worse. Document Released: 02/15/2005 Document Revised: 05/13/2013 Document Reviewed: 03/13/2011 [facility] Patient Information 2015 [place], [place]. This information is not intended to replace advice given to you by your health care provider. Make sure you discuss any questions you have with your health care provider.

## 2014-08-17 NOTE — ED Notes (Addendum)
She tripped and fell. States she has been falling and confusion for 3 weeks. States it feels like panic attacks. Extensive bruising to her face. Alert and ambulatory on arrival. Her friend drove her here.

## 2014-08-17 NOTE — ED Notes (Signed)
Pt out of room for x-ray 

## 2014-08-17 NOTE — ED Notes (Signed)
Critical lab 2.7 received, RN notified and dr. Leonides Schanz notified of results

## 2014-08-17 NOTE — ED Notes (Signed)
Patient transported to CT and XR 

## 2014-08-18 NOTE — ED Notes (Signed)
Pt. Teaching done on advantages of admission and results of testing.  Pt. Verbalized understanding of all.  Pt. Aware of need to see her PMD or be seen at [facility] for further testing due to problems and symptoms for being seen here at [facility] this visit.    No distress in Pt. At time of discharge.  Pt. Dressed herself and VSS.

## 2014-08-18 NOTE — ED Provider Notes (Signed)
[place] from Dr. Leonides Schanz. Awaiting 2nd troponin. Offered admission again and had discussion with her regarding her recurrent syncope, but she refused again. Discharged home after negative 2nd troponin.  1. Syncope, unspecified syncope type   2. Fall   3. Head injury, initial encounter   4. Multiple contusions   5. Hypokalemia      Evelina Bucy, MD 08/18/14 [phone]

## 2014-08-21 ENCOUNTER — Emergency Department (HOSPITAL_COMMUNITY): Payer: Medicare Other

## 2014-08-21 ENCOUNTER — Encounter (HOSPITAL_COMMUNITY): Payer: Self-pay

## 2014-08-21 ENCOUNTER — Inpatient Hospital Stay (HOSPITAL_COMMUNITY)
Admission: EM | Admit: 2014-08-21 | Discharge: 2014-08-24 | DRG: 087 | Disposition: A | Discharge status: Home / Self Care | Payer: Medicare Other | Attending: Internal Medicine | Admitting: Internal Medicine

## 2014-08-21 DIAGNOSIS — M25552 Pain in left hip: Secondary | ICD-10-CM | POA: Diagnosis not present

## 2014-08-21 DIAGNOSIS — I10 Essential (primary) hypertension: Secondary | ICD-10-CM | POA: Diagnosis present

## 2014-08-21 DIAGNOSIS — S065X9A Traumatic subdural hemorrhage with loss of consciousness of unspecified duration, initial encounter: Secondary | ICD-10-CM | POA: Diagnosis not present

## 2014-08-21 DIAGNOSIS — R55 Syncope and collapse: Secondary | ICD-10-CM | POA: Diagnosis not present

## 2014-08-21 DIAGNOSIS — C50911 Malignant neoplasm of unspecified site of right female breast: Secondary | ICD-10-CM | POA: Diagnosis present

## 2014-08-21 DIAGNOSIS — R296 Repeated falls: Secondary | ICD-10-CM

## 2014-08-21 DIAGNOSIS — F329 Major depressive disorder, single episode, unspecified: Secondary | ICD-10-CM | POA: Diagnosis present

## 2014-08-21 DIAGNOSIS — E876 Hypokalemia: Secondary | ICD-10-CM | POA: Diagnosis present

## 2014-08-21 DIAGNOSIS — W19XXXA Unspecified fall, initial encounter: Secondary | ICD-10-CM

## 2014-08-21 DIAGNOSIS — C189 Malignant neoplasm of colon, unspecified: Secondary | ICD-10-CM | POA: Diagnosis present

## 2014-08-21 DIAGNOSIS — N181 Chronic kidney disease, stage 1: Secondary | ICD-10-CM | POA: Diagnosis present

## 2014-08-21 DIAGNOSIS — M545 Low back pain: Secondary | ICD-10-CM | POA: Diagnosis not present

## 2014-08-21 DIAGNOSIS — S065XAA Traumatic subdural hemorrhage with loss of consciousness status unknown, initial encounter: Secondary | ICD-10-CM | POA: Diagnosis present

## 2014-08-21 DIAGNOSIS — D649 Anemia, unspecified: Secondary | ICD-10-CM | POA: Diagnosis not present

## 2014-08-21 DIAGNOSIS — S0083XA Contusion of other part of head, initial encounter: Secondary | ICD-10-CM | POA: Diagnosis present

## 2014-08-21 DIAGNOSIS — Z96611 Presence of right artificial shoulder joint: Secondary | ICD-10-CM | POA: Diagnosis present

## 2014-08-21 DIAGNOSIS — I129 Hypertensive chronic kidney disease with stage 1 through stage 4 chronic kidney disease, or unspecified chronic kidney disease: Secondary | ICD-10-CM | POA: Diagnosis present

## 2014-08-21 DIAGNOSIS — S065X0A Traumatic subdural hemorrhage without loss of consciousness, initial encounter: Principal | ICD-10-CM | POA: Diagnosis present

## 2014-08-21 DIAGNOSIS — G2581 Restless legs syndrome: Secondary | ICD-10-CM | POA: Diagnosis present

## 2014-08-21 DIAGNOSIS — I6200 Nontraumatic subdural hemorrhage, unspecified: Secondary | ICD-10-CM | POA: Diagnosis not present

## 2014-08-21 DIAGNOSIS — Z85038 Personal history of other malignant neoplasm of large intestine: Secondary | ICD-10-CM

## 2014-08-21 DIAGNOSIS — Z79899 Other long term (current) drug therapy: Secondary | ICD-10-CM

## 2014-08-21 DIAGNOSIS — Z853 Personal history of malignant neoplasm of breast: Secondary | ICD-10-CM

## 2014-08-21 DIAGNOSIS — Z96612 Presence of left artificial shoulder joint: Secondary | ICD-10-CM | POA: Diagnosis present

## 2014-08-21 DIAGNOSIS — G894 Chronic pain syndrome: Secondary | ICD-10-CM | POA: Diagnosis present

## 2014-08-21 DIAGNOSIS — Z923 Personal history of irradiation: Secondary | ICD-10-CM

## 2014-08-21 DIAGNOSIS — Z87891 Personal history of nicotine dependence: Secondary | ICD-10-CM

## 2014-08-21 LAB — URINALYSIS, ROUTINE W REFLEX MICROSCOPIC
Bilirubin Urine: NEGATIVE
GLUCOSE, UA: NEGATIVE mg/dL
Hgb urine dipstick: NEGATIVE
Ketones, ur: NEGATIVE mg/dL
Leukocytes, UA: NEGATIVE
NITRITE: NEGATIVE
PH: 5 (ref 5.0–8.0)
PROTEIN: NEGATIVE mg/dL
Specific Gravity, Urine: 1.026 (ref 1.005–1.030)
UROBILINOGEN UA: 0.2 mg/dL (ref 0.0–1.0)

## 2014-08-21 LAB — CBC
HEMATOCRIT: 34.3 % — AB (ref 36.0–46.0)
Hemoglobin: 11.4 g/dL — ABNORMAL LOW (ref 12.0–15.0)
MCH: 29.5 pg (ref 26.0–34.0)
MCHC: 33.2 g/dL (ref 30.0–36.0)
MCV: 88.6 fL (ref 78.0–100.0)
Platelets: 230 10*3/uL (ref 150–400)
RBC: 3.87 MIL/uL (ref 3.87–5.11)
RDW: 12.9 % (ref 11.5–15.5)
WBC: 4.6 10*3/uL (ref 4.0–10.5)

## 2014-08-21 LAB — BASIC METABOLIC PANEL
Anion gap: 11 (ref 5–15)
BUN: 24 mg/dL — ABNORMAL HIGH (ref 6–23)
CO2: 26 mmol/L (ref 19–32)
Calcium: 9.5 mg/dL (ref 8.4–10.5)
Chloride: 104 mmol/L (ref 96–112)
Creatinine, Ser: 1.21 mg/dL — ABNORMAL HIGH (ref 0.50–1.10)
GFR calc Af Amer: 52 mL/min — ABNORMAL LOW (ref >90)
GFR, EST NON AFRICAN AMERICAN: 45 mL/min — AB (ref >90)
GLUCOSE: 104 mg/dL — AB (ref 70–99)
POTASSIUM: 3.3 mmol/L — AB (ref 3.5–5.1)
SODIUM: 141 mmol/L (ref 135–145)

## 2014-08-21 LAB — I-STAT TROPONIN, ED: Troponin i, poc: 0.02 ng/mL (ref 0.00–0.08)

## 2014-08-21 NOTE — ED Notes (Signed)
Pt. Reports having a continuous headache

## 2014-08-21 NOTE — ED Notes (Signed)
Since March 9th pt. Had a panic attack and syncope.  Since then she has been falling. She was at [facility] on the 28th and they advised her to come to [name] for further eval.  She was unable to come then and she is here now,.  She continues to fall and have syncopal episdoes.  Pt.s rt. Side of her face is bruised.  GCS 15 Pt. Denies any pain, pt. Reports having weakness.

## 2014-08-21 NOTE — ED Provider Notes (Signed)
CSN: [id number]     Arrival date & time 08/21/14  1622 History   First MD Initiated Contact with Patient 08/21/14 1930     Chief Complaint  Patient presents with  . Loss of Consciousness    fall   [name] is a 69 y.o. female with a history of hypertension, depression, lung cancer, breast cancer, and chronic back pain who presents to the ED complaining of recurrent syncopal episodes with falls over about the last 3 weeks. She reports having episodes where she would just lose consciousness and fall. She denies any prodromal symptoms prior to falling such as chest pain, lightheadedness, dizziness, or shortness of breath. She was seen here recently for these falls and had an extensive work up at that time, but refused admission. She reports falling 3 times since last being seen. She was offered admission, but refused because "she had things to do." She returns today because she has fallen again several times and she was unable to get in with her PCP today. She complains of fatigue but no focal weakness. She claims a feeling intermittently lightheaded as well as intermittent shortness of breath but none currently. She is complaining of pain in her low back, right shoulder, left knee, right face, and right upper thigh. She also reports a headache that she rates at a 2/10. She reports intermittent shortness of breath but none currently. The patient denies fevers, weakness, numbness, tingling, dull pain, nausea, vomiting, dysuria, hematuria, acute vision changes, or diarrhea. Patient does take oxycodone for her chronic back pain.   (Consider location/radiation/quality/duration/timing/severity/associated sxs/prior Treatment) HPI  Past Medical History  Diagnosis Date  . Hypertension   . Depression   . Back pain, chronic   . Breast cancer 09/09/10  . Cancer of colon 05/24/2011   Past Surgical History  Procedure Laterality Date  . Back surgery    . Shoulder surgery    . Tumor removal  Tumor  removed R breast  . Lumbar disc surgery    . Partial hysterectomy    . Colonoscopy  05/03/2011    Procedure: COLONOSCOPY;  Surgeon: Jeryl Columbia, MD;  Location: [facility] ENDOSCOPY;  Service: Endoscopy;  Laterality: N/A;  . Colon surgery  04/2011  . Breast lumpectomy  10/10/2010    Rt Breast  . Joint replacement      R&L total shoulder replacements   Family History  Problem Relation Age of Onset  . Diabetes Mother   . Hypertension Mother   . Cancer Mother     leukemia  . Sudden death Mother   . Anesthesia problems Neg Hx   . Hypotension Neg Hx   . Malignant hyperthermia Neg Hx   . Pseudochol deficiency Neg Hx    History  Substance Use Topics  . Smoking status: Former Smoker -- 1.00 packs/day    Types: Cigarettes  . Smokeless tobacco: Former Systems developer    Quit date: 04/22/2010  . Alcohol Use: No   OB History    No data available     Review of Systems  Constitutional: Negative for fever and chills.  HENT: Positive for facial swelling. Negative for congestion, ear pain, sore throat and trouble swallowing.   Eyes: Negative for visual disturbance.  Respiratory: Negative for cough, shortness of breath and wheezing.   Cardiovascular: Negative for chest pain and palpitations.  Gastrointestinal: Negative for nausea, vomiting, abdominal pain and diarrhea.  Genitourinary: Negative for dysuria, frequency, hematuria and difficulty urinating.  Musculoskeletal: Positive for myalgias, back pain and  joint swelling. Negative for neck pain and neck stiffness.  Skin: Positive for color change.  Neurological: Positive for syncope and headaches. Negative for dizziness, seizures, speech difficulty, weakness, light-headedness and numbness.  Psychiatric/Behavioral: The patient is nervous/anxious.       Allergies  Review of patient's allergies indicates no known allergies.  Home Medications   Prior to Admission medications   Medication Sig Start Date End Date Taking? Authorizing Provider   ALPRAZolam Duanne Moron) 0.5 MG tablet Take 0.5 mg by mouth 3 (three) times daily as needed for anxiety.    Yes Historical Provider, MD  anastrozole (ARIMIDEX) 1 MG tablet Take 1 tablet (1 mg total) by mouth at bedtime. 05/20/14  Yes Truitt Merle, MD  buPROPion (WELLBUTRIN XL) 150 MG 24 hr tablet Take 150 mg by mouth daily.   Yes Historical Provider, MD  docusate sodium (COLACE) 100 MG capsule Take 200 mg by mouth at bedtime.   Yes Historical Provider, MD  fluvoxaMINE (LUVOX) 50 MG tablet Take 50-100 mg by mouth 2 (two) times daily. She takes one tablet in the morning and two tablets in the evening.   Yes Historical Provider, MD  furosemide (LASIX) 20 MG tablet Take 20 mg by mouth daily.   Yes Historical Provider, MD  naproxen sodium (ANAPROX) 220 MG tablet Take 220 mg by mouth 3 (three) times daily as needed. For headache pain   Yes Historical Provider, MD  OxyCODONE HCl ER (OXYCONTIN) 60 MG T12A Take 60 mg by mouth every 8 (eight) hours.   Yes Historical Provider, MD  Potassium Gluconate 595 MG CAPS Take 595 mg by mouth 2 (two) times daily.    Yes Historical Provider, MD  rOPINIRole (REQUIP) 0.5 MG tablet Take 0.5 mg by mouth at bedtime.   Yes Historical Provider, MD  potassium chloride (K-DUR) 10 MEQ tablet Take 1 tablet (10 mEq total) by mouth 2 (two) times daily. Patient not taking: Reported on 08/21/2014 08/17/14   Kristen N Ward, DO   BP 164/71 mmHg  Pulse 64  Temp(Src) 97.7 F (36.5 C) (Oral)  Resp 14  Wt 138 lb (62.596 kg)  SpO2 98% Physical Exam  Constitutional: She is oriented to person, place, and time. She appears well-developed and well-nourished. No distress.  HENT:  Head: Normocephalic.  Right Ear: External ear normal.  Left Ear: External ear normal.  Nose: Nose normal.  Mouth/Throat: Oropharynx is clear and moist. No oropharyngeal exudate.  Old Bruising to her right side of her face around her orbit. No lacerations or bleeding. No crepitus.   Eyes: Conjunctivae and EOM are normal.  Pupils are equal, round, and reactive to light. Right eye exhibits no discharge. Left eye exhibits no discharge.  Neck: Normal range of motion. Neck supple. No JVD present. No tracheal deviation present.  No neck bony point tenderness.  Cardiovascular: Normal rate, regular rhythm, normal heart sounds and intact distal pulses.  Exam reveals no gallop and no friction rub.   No murmur heard. Bilateral radial, posterior tibialis and dorsalis pedis pulses are intact.   Pulmonary/Chest: Effort normal and breath sounds normal. No respiratory distress. She has no wheezes. She has no rales. She exhibits no tenderness.  Abdominal: Soft. Bowel sounds are normal. She exhibits no distension and no mass. There is no tenderness. There is no rebound and no guarding.  Abdomen is soft and nontender to palpation.  Musculoskeletal: She exhibits tenderness. She exhibits no edema.  Left anterior knee is mildly tender to palpation. Full range of motion of  her left knee. There is some tenderness in range of motion of her right shoulder. There is an abrasion noted to her right scapula that is not bleeding. There is some ecchymosis noted beneath her armpit of her right shoulder. Patient is full range motion of her right elbow and right wrist. There is no forearm bony point tenderness. No hand any point tenderness. There is ecchymosis noted to the lateral aspect of her right thigh. No pelvic instability noted. Full range of motion at her hip bilaterally. There is a bruise noted to her left lower back. There is no neck or back bony point tenderness. There is no back crepitus, step-offs, edema or deformity noted. Bilateral calves are equal in size.  Lymphadenopathy:    She has no cervical adenopathy.  Neurological: She is alert and oriented to person, place, and time. No cranial nerve deficit. Coordination normal.  Cranial nerves are intact. EOMs intact bilaterally. No pronator drift. Patient has intact bilaterally. Rapid  alternating movements intact bilaterally. Heel-to-shin intact bilaterally. Sensation intact to bilateral face, upper and lower extremities.   Skin: Skin is warm and dry. No rash noted. She is not diaphoretic. No erythema. No pallor.  Psychiatric: She has a normal mood and affect. Her behavior is normal.  Nursing note and vitals reviewed.   ED Course  Procedures (including critical care time) Labs Review Labs Reviewed  CBC - Abnormal; Notable for the following:    Hemoglobin 11.4 (*)    HCT 34.3 (*)    All other components within normal limits  BASIC METABOLIC PANEL - Abnormal; Notable for the following:    Potassium 3.3 (*)    Glucose, Bld 104 (*)    BUN 24 (*)    Creatinine, Ser 1.21 (*)    GFR calc non Af Amer 45 (*)    GFR calc Af Amer 52 (*)    All other components within normal limits  URINALYSIS, ROUTINE W REFLEX MICROSCOPIC - Abnormal; Notable for the following:    Color, Urine AMBER (*)    All other components within normal limits  I-STAT TROPOININ, ED    Imaging Review Dg Chest 2 View  08/21/2014   CLINICAL DATA:  Multiple recent falls  EXAM: CHEST  2 VIEW  COMPARISON:  03/24/2012  FINDINGS: Multiple old healed right rib fracture deformities are again evident. The lungs are clear. The pulmonary vasculature is normal. There are no pleural effusions. Cardiac, mediastinal and hilar contours are unremarkable and unchanged. There is no effusion.  IMPRESSION: No active cardiopulmonary disease.   Electronically Signed   By: Andreas Newport M.D.   On: 08/21/2014 21:47   Dg Thoracic Spine 2 View  08/21/2014   CLINICAL DATA:  Multiple falls recently, with mid back pain.  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 There is no evidence of acute thoracic vertebral fracture. The upper portions of the lumbar fixation hardware are visible and appear intact. There are severe degenerative disc changes in the mid to  lower thoracic spine with degenerative -appearing retrolisthesis at multiple levels. This appears unchanged from the CT of 08/17/2014. No bone lesion or bony destruction is evident.  IMPRESSION: Kyphoscoliosis and severe degenerative disc changes. No evidence of acute fracture.   Electronically Signed   By: Andreas Newport M.D.   On: 08/21/2014 21:46   Dg Lumbar Spine Complete  08/21/2014   CLINICAL DATA:  Multiple falls. Most recent yesterday. Low back pain.  EXAM: LUMBAR SPINE - COMPLETE 4+ VIEW  COMPARISON:  CT of 05/03/2011.  FINDINGS: Convex left lumbar spine curvature. Advanced atherosclerosis involving the aorta. Status post trans pedicle screw fixation at L2-5. No acute hardware complication. Advanced lower thoracic and upper lumbar spondylosis. Loss of intervertebral disc height. Maintenance of vertebral body height.  IMPRESSION: Spondylosis and surgical changes.  No acute osseous abnormality.   Electronically Signed   By: Abigail Miyamoto M.D.   On: 08/21/2014 21:48   Dg Pelvis 1-2 Views  08/21/2014   CLINICAL DATA:  Multiple falls recently  EXAM: PELVIS - 1-2 VIEW  COMPARISON:  01/01/2012  FINDINGS: There is no evidence of pelvic fracture or diastasis. No pelvic bone lesions are seen.  IMPRESSION: Negative.   Electronically Signed   By: Andreas Newport M.D.   On: 08/21/2014 21:48   Dg Shoulder Right  08/21/2014   CLINICAL DATA:  Multiple fall since 07/29/2014. Right shoulder pain.  EXAM: RIGHT SHOULDER - 2+ VIEW  COMPARISON:  08/17/2014 humerus films.  FINDINGS: Right shoulder arthroplasty. Visualized portion of the right hemithorax is normal. Surgical clips which project over the right proximal humerus or axilla. The humeral head component of the arthroplasty projects anterior to the central glenoid on the scapular view. No evidence of dislocation on the first image.  IMPRESSION: Right shoulder arthroplasty. Apparent anterior position of the humeral component relative to the glenoid on the scapular  view. This could be projectional. If there is a concern of dislocation, consider further radiographic evaluation.   Electronically Signed   By: Abigail Miyamoto M.D.   On: 08/21/2014 21:44   Ct Head Wo Contrast  08/21/2014   CLINICAL DATA:  Syncope and panic attacks.  Recent falls.  EXAM: CT HEAD WITHOUT CONTRAST  CT MAXILLOFACIAL WITHOUT CONTRAST  CT CERVICAL SPINE WITHOUT CONTRAST  TECHNIQUE: Multidetector CT imaging of the head, cervical spine, and maxillofacial structures were performed using the standard protocol without intravenous contrast. Multiplanar CT image reconstructions of the cervical spine and maxillofacial structures were also generated.  COMPARISON:  08/17/2014  FINDINGS: CT HEAD FINDINGS  There is a left frontotemporal subdural hematoma measuring about 4 mm in maximum thickness. There is no significant mass effect. There is no midline shift. The basal cisterns remain patent. Moderate generalized atrophy is present, unchanged. There is white matter hypodensity consistent with chronic microvascular ischemic disease. There is an old lacunar infarction in the right caudate. No calvarial or skullbase fracture is evident.  CT MAXILLOFACIAL FINDINGS  No facial or periorbital fracture is evident. Orbital floors are intact. Zygomatic arches and pterygoid plates are intact. There is right frontal scalp hematoma.  CT CERVICAL SPINE FINDINGS  Portions of the lower cervical spine are excluded from the sagittal reconstructions posteriorly, but are seen to good advantage on the axial and coronal images. There is no evidence of cervical spine fracture. Cervical vertebrae are normal in height. The vertebral column, pedicles and facet articulations are intact with no evidence of acute fracture. No acute soft tissue abnormality is evident. Moderately severe facet arthritis is present at multiple levels.  IMPRESSION: 1. Left frontotemporal subdural hematoma, 4 mm. No mass effect or midline shift. 2. Chronic atrophy and  microvascular ischemic disease. 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 3. Negative for acute maxillofacial fracture. 4. Negative for acute cervical spine fracture.  These results were called by telephone at the time of interpretation on 08/21/2014 at 10:29 pm to Dr. Waynetta Pean , who verbally acknowledged these results.  Electronically Signed   By: Andreas Newport M.D.   On: 08/21/2014 22:33   Ct Cervical Spine Wo Contrast  08/21/2014   CLINICAL DATA:  Syncope and panic attacks.  Recent falls.  EXAM: CT HEAD WITHOUT CONTRAST  CT MAXILLOFACIAL WITHOUT CONTRAST  CT CERVICAL SPINE WITHOUT CONTRAST  TECHNIQUE: Multidetector CT imaging of the head, cervical spine, and maxillofacial structures were performed using the standard protocol without intravenous contrast. Multiplanar CT image reconstructions of the cervical spine and maxillofacial structures were also generated.  COMPARISON:  08/17/2014  FINDINGS: CT HEAD FINDINGS  There is a left frontotemporal subdural hematoma measuring about 4 mm in maximum thickness. There is no significant mass effect. There is no midline shift. The basal cisterns remain patent. Moderate generalized atrophy is present, unchanged. There is white matter hypodensity consistent with chronic microvascular ischemic disease. There is an old lacunar infarction in the right caudate. No calvarial or skullbase fracture is evident.  CT MAXILLOFACIAL FINDINGS  No facial or periorbital fracture is evident. Orbital floors are intact. Zygomatic arches and pterygoid plates are intact. There is right frontal scalp hematoma.  CT CERVICAL SPINE FINDINGS  Portions of the lower cervical spine are excluded from the sagittal reconstructions posteriorly, but are seen to good advantage on the axial and coronal images. There is no evidence of cervical spine fracture. Cervical vertebrae are normal in height. The vertebral column, pedicles and facet articulations are intact with no evidence of acute  fracture. No acute soft tissue abnormality is evident. Moderately severe facet arthritis is present at multiple levels.  IMPRESSION: 1. Left frontotemporal subdural hematoma, 4 mm. No mass effect or midline shift. 2. Chronic atrophy and microvascular ischemic disease. 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 3. Negative for acute maxillofacial fracture. 4. Negative for acute cervical spine fracture.  These results were called by telephone at the time of interpretation on 08/21/2014 at 10:29 pm to Dr. Waynetta Pean , who verbally acknowledged these results.   Electronically Signed   By: Andreas Newport M.D.   On: 08/21/2014 22:33   Dg Knee Complete 4 Views Left  08/21/2014   CLINICAL DATA:  Multiple falls since 07/29/2014.  EXAM: LEFT KNEE - COMPLETE 4+ VIEW  COMPARISON:  08/17/2014  FINDINGS: Negative for acute fracture, dislocation or radiopaque foreign body. There is chondrocalcinosis. No bone lesion or bony destruction is evident.  IMPRESSION: Negative for acute fracture.   Electronically Signed   By: Andreas Newport M.D.   On: 08/21/2014 21:43   Dg Femur Min 2 Views Left  08/21/2014   CLINICAL DATA:  Multiple falls, left hip pain  EXAM: LEFT FEMUR 2 VIEWS  COMPARISON:  None.  FINDINGS: No fracture or dislocation is seen.  Degenerative changes of the left hip and knee.  Visualized bony pelvis appears intact.  Vascular calcifications.  IMPRESSION: No fracture or dislocation is seen.   Electronically Signed   By: Julian Hy M.D.   On: 08/21/2014 21:45   Ct Maxillofacial Wo Cm  08/21/2014   CLINICAL DATA:  Syncope and panic attacks.  Recent falls.  EXAM: CT HEAD WITHOUT CONTRAST  CT MAXILLOFACIAL WITHOUT CONTRAST  CT CERVICAL SPINE WITHOUT CONTRAST  TECHNIQUE: Multidetector CT imaging of the head, cervical spine, and maxillofacial structures were performed using the standard protocol without intravenous contrast. Multiplanar CT image reconstructions of the cervical spine and maxillofacial  structures were also generated.  COMPARISON:  08/17/2014  FINDINGS: CT HEAD FINDINGS  There is a left frontotemporal subdural hematoma measuring  about 4 mm in maximum thickness. There is no significant mass effect. There is no midline shift. The basal cisterns remain patent. Moderate generalized atrophy is present, unchanged. There is white matter hypodensity consistent with chronic microvascular ischemic disease. There is an old lacunar infarction in the right caudate. No calvarial or skullbase fracture is evident.  CT MAXILLOFACIAL FINDINGS  No facial or periorbital fracture is evident. Orbital floors are intact. Zygomatic arches and pterygoid plates are intact. There is right frontal scalp hematoma.  CT CERVICAL SPINE FINDINGS  Portions of the lower cervical spine are excluded from the sagittal reconstructions posteriorly, but are seen to good advantage on the axial and coronal images. There is no evidence of cervical spine fracture. Cervical vertebrae are normal in height. The vertebral column, pedicles and facet articulations are intact with no evidence of acute fracture. No acute soft tissue abnormality is evident. Moderately severe facet arthritis is present at multiple levels.  IMPRESSION: 1. Left frontotemporal subdural hematoma, 4 mm. No mass effect or midline shift. 2. Chronic atrophy and microvascular ischemic disease. 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 3. Negative for acute maxillofacial fracture. 4. Negative for acute cervical spine fracture.  These results were called by telephone at the time of interpretation on 08/21/2014 at 10:29 pm to Dr. Waynetta Pean , who verbally acknowledged these results.   Electronically Signed   By: Andreas Newport M.D.   On: 08/21/2014 22:33     EKG Interpretation   Date/Time:  Friday August 21 2014 17:45:39 EDT Ventricular Rate:  66 PR Interval:  148 QRS Duration: 94 QT Interval:  418 QTC Calculation: 438 R Axis:   66 Text Interpretation:  Normal  sinus rhythm Normal ECG No significant change  was found Confirmed by Wyvonnia Dusky  MD, STEPHEN [phone]) on 08/21/2014 8:26:36 PM      Filed Vitals:   08/21/14 1947 08/21/14 2030 08/21/14 2148 08/21/14 2152  BP:  169/62 189/62 164/71  Pulse:  65 64   Temp: 97.7 F (36.5 C)     TempSrc:      Resp:  11 14   Weight:      SpO2:  98% 98%      MDM   Meds given in ED:  Medications - No data to display  New Prescriptions   No medications on file    Final diagnoses:  Fall  Subdural hematoma  Syncope, unspecified syncope type   This is a 69 y.o. female with a history of hypertension, depression, lung cancer, breast cancer, and chronic back pain who presents to the ED complaining of recurrent syncopal episodes with falls over about the last 3 weeks. She reports having episodes where she would just lose consciousness and fall. She denies any prodromal symptoms prior to falling such as chest pain, lightheadedness, dizziness, or shortness of breath. She was seen here recently for these falls and had an extensive work up at that time, but refused admission. The patient has multiple areas of bruising on her body. Please see exam for further. Patient has no focal neuro deficits on exam. She has a negative troponin. CBC is unremarkable. BMP shows a potassium of 3.3 and slightly improved kidney function at this visit with a creatinine of 1.21. Urinalysis is negative for infection.  Radiology called to inform of the patients 4 mm left frontotemporal subdural hematoma. There is no mass effect or midline shift. I consulted with neurosurgeon Dr. Joya Salm who will see the patient in consult during her admission.  CT  of C-spine and maxofacial are negative. Right shoulder x-ray does show some apparent anterior position other humeral component relative to the glenoid on the scapular view. Etiology spaces could be projectional. I am not concerned for dislocation based on my exam. Right knee x-rays negative. Thoracic  spine x-ray is negative for acute fracture. Lumbar spine x-rays negative for acute fracture. Chest x-ray is negative. Pelvis and left femur plain films are negative for fracture or dislocation. I discussed imaging findings with the patient and her family. The patient's family and the patient are in agreement with admission. Consulted with Dr. Arnoldo Morale for admission who accepts this patient for admission.  This patient was discussed with and evaluated by Dr. Wyvonnia Dusky who agrees with assessment and plan.    Waynetta Pean, PA-C 08/22/14 [id number]  Ezequiel Essex, MD 08/22/14 [phone]

## 2014-08-21 NOTE — ED Notes (Signed)
Patient transported to X-ray 

## 2014-08-21 NOTE — ED Notes (Signed)
PA notified on pt.'s elevated blood pressure.

## 2014-08-22 ENCOUNTER — Observation Stay (HOSPITAL_COMMUNITY): Payer: Medicare Other

## 2014-08-22 DIAGNOSIS — S0083XA Contusion of other part of head, initial encounter: Secondary | ICD-10-CM | POA: Diagnosis not present

## 2014-08-22 DIAGNOSIS — S065X0A Traumatic subdural hemorrhage without loss of consciousness, initial encounter: Secondary | ICD-10-CM | POA: Diagnosis not present

## 2014-08-22 DIAGNOSIS — R55 Syncope and collapse: Secondary | ICD-10-CM | POA: Diagnosis not present

## 2014-08-22 DIAGNOSIS — Z853 Personal history of malignant neoplasm of breast: Secondary | ICD-10-CM | POA: Diagnosis not present

## 2014-08-22 DIAGNOSIS — I10 Essential (primary) hypertension: Secondary | ICD-10-CM | POA: Diagnosis not present

## 2014-08-22 DIAGNOSIS — R296 Repeated falls: Secondary | ICD-10-CM

## 2014-08-22 DIAGNOSIS — Z96612 Presence of left artificial shoulder joint: Secondary | ICD-10-CM | POA: Diagnosis present

## 2014-08-22 DIAGNOSIS — I129 Hypertensive chronic kidney disease with stage 1 through stage 4 chronic kidney disease, or unspecified chronic kidney disease: Secondary | ICD-10-CM | POA: Diagnosis present

## 2014-08-22 DIAGNOSIS — G2581 Restless legs syndrome: Secondary | ICD-10-CM | POA: Diagnosis present

## 2014-08-22 DIAGNOSIS — Z87891 Personal history of nicotine dependence: Secondary | ICD-10-CM | POA: Diagnosis not present

## 2014-08-22 DIAGNOSIS — I6200 Nontraumatic subdural hemorrhage, unspecified: Secondary | ICD-10-CM

## 2014-08-22 DIAGNOSIS — M545 Low back pain: Secondary | ICD-10-CM | POA: Diagnosis present

## 2014-08-22 DIAGNOSIS — Z79899 Other long term (current) drug therapy: Secondary | ICD-10-CM | POA: Diagnosis not present

## 2014-08-22 DIAGNOSIS — Z96611 Presence of right artificial shoulder joint: Secondary | ICD-10-CM | POA: Diagnosis present

## 2014-08-22 DIAGNOSIS — E876 Hypokalemia: Secondary | ICD-10-CM | POA: Diagnosis not present

## 2014-08-22 DIAGNOSIS — G894 Chronic pain syndrome: Secondary | ICD-10-CM | POA: Diagnosis present

## 2014-08-22 DIAGNOSIS — W19XXXA Unspecified fall, initial encounter: Secondary | ICD-10-CM | POA: Diagnosis present

## 2014-08-22 DIAGNOSIS — Z923 Personal history of irradiation: Secondary | ICD-10-CM | POA: Diagnosis not present

## 2014-08-22 DIAGNOSIS — N181 Chronic kidney disease, stage 1: Secondary | ICD-10-CM | POA: Diagnosis present

## 2014-08-22 DIAGNOSIS — F329 Major depressive disorder, single episode, unspecified: Secondary | ICD-10-CM | POA: Diagnosis present

## 2014-08-22 DIAGNOSIS — S0083XD Contusion of other part of head, subsequent encounter: Secondary | ICD-10-CM | POA: Diagnosis not present

## 2014-08-22 DIAGNOSIS — D649 Anemia, unspecified: Secondary | ICD-10-CM | POA: Diagnosis present

## 2014-08-22 DIAGNOSIS — Z85038 Personal history of other malignant neoplasm of large intestine: Secondary | ICD-10-CM | POA: Diagnosis not present

## 2014-08-22 LAB — BASIC METABOLIC PANEL
ANION GAP: 8 (ref 5–15)
BUN: 21 mg/dL (ref 6–23)
CALCIUM: 9.2 mg/dL (ref 8.4–10.5)
CHLORIDE: 106 mmol/L (ref 96–112)
CO2: 27 mmol/L (ref 19–32)
CREATININE: 1.17 mg/dL — AB (ref 0.50–1.10)
GFR, EST AFRICAN AMERICAN: 55 mL/min — AB (ref >90)
GFR, EST NON AFRICAN AMERICAN: 47 mL/min — AB (ref >90)
Glucose, Bld: 119 mg/dL — ABNORMAL HIGH (ref 70–99)
Potassium: 3.1 mmol/L — ABNORMAL LOW (ref 3.5–5.1)
Sodium: 141 mmol/L (ref 135–145)

## 2014-08-22 LAB — GLUCOSE, CAPILLARY
GLUCOSE-CAPILLARY: 116 mg/dL — AB (ref 70–99)
GLUCOSE-CAPILLARY: 107 mg/dL — AB (ref 70–99)
Glucose-Capillary: 92 mg/dL (ref 70–99)
Glucose-Capillary: 110 mg/dL — ABNORMAL HIGH (ref 70–99)

## 2014-08-22 LAB — CBC
HCT: 33.6 % — ABNORMAL LOW (ref 36.0–46.0)
HEMOGLOBIN: 11.1 g/dL — AB (ref 12.0–15.0)
MCH: 29.8 pg (ref 26.0–34.0)
MCHC: 33 g/dL (ref 30.0–36.0)
MCV: 90.3 fL (ref 78.0–100.0)
Platelets: 213 10*3/uL (ref 150–400)
RBC: 3.72 MIL/uL — AB (ref 3.87–5.11)
RDW: 12.8 % (ref 11.5–15.5)
WBC: 4.2 10*3/uL (ref 4.0–10.5)

## 2014-08-22 LAB — MAGNESIUM: MAGNESIUM: 2 mg/dL (ref 1.5–2.5)

## 2014-08-22 MED ORDER — DOCUSATE SODIUM 100 MG PO CAPS
200.0000 mg | ORAL_CAPSULE | Freq: Every day | ORAL | Status: DC
  Administered 2014-08-22 – 2014-08-23 (×3): 200 mg via ORAL
  Filled 2014-08-22 (×4): qty 2

## 2014-08-22 MED ORDER — SODIUM CHLORIDE 0.9 % IV SOLN: 250.0000 mL | INTRAVENOUS | Status: DC | PRN

## 2014-08-22 MED ORDER — SODIUM CHLORIDE 0.9 % IJ SOLN
3.0000 mL | Freq: Two times a day (BID) | INTRAMUSCULAR | Status: DC
  Administered 2014-08-22 – 2014-08-24 (×5): 3 mL via INTRAVENOUS

## 2014-08-22 MED ORDER — ACETAMINOPHEN 325 MG PO TABS
650.0000 mg | ORAL_TABLET | Freq: Four times a day (QID) | ORAL | Status: DC | PRN
Start: 2014-08-22 — End: 2014-08-24
  Administered 2014-08-24: 650 mg via ORAL
  Filled 2014-08-22: qty 2

## 2014-08-22 MED ORDER — ONDANSETRON HCL 4 MG PO TABS: 4.0000 mg | ORAL_TABLET | Freq: Four times a day (QID) | ORAL | Status: DC | PRN

## 2014-08-22 MED ORDER — POTASSIUM CHLORIDE CRYS ER 20 MEQ PO TBCR
40.0000 meq | EXTENDED_RELEASE_TABLET | Freq: Two times a day (BID) | ORAL | Status: AC
  Administered 2014-08-22 (×2): 40 meq via ORAL
  Filled 2014-08-22 (×2): qty 2

## 2014-08-22 MED ORDER — POTASSIUM GLUCONATE 595 MG PO CAPS: 595.0000 mg | ORAL_CAPSULE | Freq: Two times a day (BID) | ORAL | Status: DC

## 2014-08-22 MED ORDER — FUROSEMIDE 20 MG PO TABS
20.0000 mg | ORAL_TABLET | Freq: Every day | ORAL | Status: DC
  Administered 2014-08-22 – 2014-08-24 (×2): 20 mg via ORAL
  Filled 2014-08-22 (×3): qty 1

## 2014-08-22 MED ORDER — FLUVOXAMINE MALEATE 50 MG PO TABS: 50.0000 mg | ORAL_TABLET | Freq: Two times a day (BID) | ORAL | Status: DC

## 2014-08-22 MED ORDER — FLUVOXAMINE MALEATE 100 MG PO TABS
100.0000 mg | ORAL_TABLET | Freq: Every evening | ORAL | Status: DC
  Administered 2014-08-22 – 2014-08-23 (×2): 100 mg via ORAL
  Filled 2014-08-22 (×3): qty 1

## 2014-08-22 MED ORDER — HYDROMORPHONE HCL 1 MG/ML IJ SOLN: 0.5000 mg | INTRAMUSCULAR | Status: DC | PRN

## 2014-08-22 MED ORDER — ACETAMINOPHEN 650 MG RE SUPP: 650.0000 mg | Freq: Four times a day (QID) | RECTAL | Status: DC | PRN

## 2014-08-22 MED ORDER — ALUM & MAG HYDROXIDE-SIMETH 200-200-20 MG/5ML PO SUSP: 30.0000 mL | Freq: Four times a day (QID) | ORAL | Status: DC | PRN

## 2014-08-22 MED ORDER — ANASTROZOLE 1 MG PO TABS
1.0000 mg | ORAL_TABLET | Freq: Every day | ORAL | Status: DC
  Administered 2014-08-22 – 2014-08-23 (×3): 1 mg via ORAL
  Filled 2014-08-22 (×4): qty 1

## 2014-08-22 MED ORDER — BUPROPION HCL ER (XL) 150 MG PO TB24
150.0000 mg | ORAL_TABLET | Freq: Every day | ORAL | Status: DC
Start: 2014-08-22 — End: 2014-08-24
  Administered 2014-08-22 – 2014-08-24 (×3): 150 mg via ORAL
  Filled 2014-08-22 (×3): qty 1

## 2014-08-22 MED ORDER — ONDANSETRON HCL 4 MG/2ML IJ SOLN: 4.0000 mg | Freq: Four times a day (QID) | INTRAMUSCULAR | Status: DC | PRN

## 2014-08-22 MED ORDER — FLUVOXAMINE MALEATE 50 MG PO TABS
50.0000 mg | ORAL_TABLET | Freq: Every morning | ORAL | Status: DC
  Administered 2014-08-22 – 2014-08-24 (×3): 50 mg via ORAL
  Filled 2014-08-22 (×3): qty 1

## 2014-08-22 MED ORDER — OXYCODONE HCL 5 MG PO TABS
5.0000 mg | ORAL_TABLET | ORAL | Status: DC | PRN
  Administered 2014-08-22 – 2014-08-24 (×4): 5 mg via ORAL
  Filled 2014-08-22 (×4): qty 1

## 2014-08-22 MED ORDER — ALPRAZOLAM 0.5 MG PO TABS
0.5000 mg | ORAL_TABLET | Freq: Three times a day (TID) | ORAL | Status: DC | PRN
  Administered 2014-08-22 – 2014-08-23 (×3): 0.5 mg via ORAL
  Filled 2014-08-22 (×3): qty 1

## 2014-08-22 MED ORDER — OXYCODONE HCL ER 10 MG PO T12A
40.0000 mg | EXTENDED_RELEASE_TABLET | Freq: Three times a day (TID) | ORAL | Status: DC
  Administered 2014-08-22 – 2014-08-23 (×4): 40 mg via ORAL
  Filled 2014-08-22 (×5): qty 4

## 2014-08-22 MED ORDER — SODIUM CHLORIDE 0.9 % IJ SOLN
3.0000 mL | INTRAMUSCULAR | Status: DC | PRN
  Administered 2014-08-22: 3 mL via INTRAVENOUS
  Filled 2014-08-22: qty 3

## 2014-08-22 MED ORDER — ROPINIROLE HCL 0.5 MG PO TABS
0.5000 mg | ORAL_TABLET | Freq: Every day | ORAL | Status: DC
  Administered 2014-08-22 – 2014-08-23 (×3): 0.5 mg via ORAL
  Filled 2014-08-22 (×4): qty 1

## 2014-08-22 NOTE — Consult Note (Signed)
NAME:  [name], [name]             ACCOUNT NO.:  [id number]  MEDICAL RECORD NO.:  [id number]  LOCATION:  4N17C                        FACILITY:  [facility]  PHYSICIAN:  Leeroy Cha, M.D.   DATE OF BIRTH:  [birth year]  DATE OF CONSULTATION: DATE OF DISCHARGE:                                CONSULTATION   HISTORY OF PRESENT ILLNESS:  [name] is a lady who I have been seen in the emergency room in several occasions with multiple falls.  The last time was about 3 days ago.  A CT scan of the head was essentially negative.  Yesterday, she had the same problem when suddenly she fell. She denies any complaining prior to the fall like chest pain or any dizziness or any loss of consciousness.  The patient had a CT scan which showed a tiny subdural hematoma.  We were called for evaluation. Neurological examination clinically the patient is oriented x3.  There is no weakness.  The cranial nerves essentially are negative.  She had normal strength.  There is no stiffness of the neck.  The CT scan of the head showed a small subdural hematoma with no shift whatsoever.  There is no other lesion.  CLINICAL IMPRESSION:  Closed head injury with a small subdural hematoma. History of falls in several occasions.  RECOMMENDATION:  The patient was admitted by the Medical Service.  In the next 24 hours, we will repeat the CT scan to be sure that the hematoma is stable.  It may probably will be, try to find out why she is having the fall issue.  Prior workup had been negative.  From our point of view, our main goal is for to be sure that the hematoma does not increase in size.  We will follow along with you.          ______________________________ Leeroy Cha, M.D.     EB/MEDQ  D:  08/22/2014  T:  08/22/2014  Job:  [id number]

## 2014-08-22 NOTE — Progress Notes (Signed)
UR completed 

## 2014-08-22 NOTE — H&P (Signed)
[facility] Admission History and Physical       [name] [id number] DOB: 1946-05-17 DOA: 08/21/2014  Referring physician: EDPaPCP: No PCP Per Patient  Specialists: Neurosurgery: Dr Joya Salm  Chief Complaint: Increased Falls  HPI: [name] is a 69 y.o. female with a history of HTN, Chronic Pain, Breast Cancer S/P Lumpectomy and XRT rx diagnosed 09/2010, and Colon Cancer S/P Resection who presents to the ED after a fall.   She reports having frequent falls and had 3 falls in the last 24 hours, she denies any dizziness or prodrome,  She reports that she has had more frequent fall over the past 4 weeks.   She denies any recent medication changes other than her pain medication had been reduced by her pain clinic Doctor due to her falls.   She denies any Chest Pain or palptations or LOC.   She was seen in the ED and had a workup, and a repeat workup tonight revealed a Ct scan of the brain with a small Subdural hematoma of 4 mm of the Left Fronto-Temporal Area.   The EDP Discussed the results with the On Call Neurosurgeon Dr. Joya Salm, and he is to seen the patient in the AM.     Review of Systems:  Constitutional: No Weight Loss, No Weight Gain, Night Sweats, Fevers, Chills, Dizziness, Light Headedness, Fatigue, or Generalized Weakness HEENT: No Headaches, Difficulty Swallowing,Tooth/Dental Problems,Sore Throat,  No Sneezing, Rhinitis, Ear Ache, Nasal Congestion, or Post Nasal Drip,  Cardio-vascular:  No Chest pain, Orthopnea, PND, Edema in Lower Extremities, Anasarca, Dizziness, Palpitations  Resp: No Dyspnea, No DOE, No Productive Cough, No Non-Productive Cough, No Hemoptysis, No Wheezing.    GI: No Heartburn, Indigestion, Abdominal Pain, Nausea, Vomiting, Diarrhea, Constipation, Hematemesis, Hematochezia, Melena, Change in Bowel Habits,  Loss of Appetite  GU: No Dysuria, No Change in Color of Urine, No Urgency or Urinary Frequency, No Flank pain.  Musculoskeletal: No  Joint Pain or Swelling, No Decreased Range of Motion, No Back Pain.  Neurologic: No Syncope, No Seizures, Muscle Weakness, Paresthesia, Vision Disturbance or Loss, No Diplopia, No Vertigo, No Difficulty Walking,  Skin: No Rash or Lesions. Psych: No Change in Mood or Affect, No Depression or Anxiety, No Memory loss, No Confusion, or Hallucinations   Past Medical History  Diagnosis Date  . Hypertension   . Depression   . Back pain, chronic   . Breast cancer 09/09/10  . Cancer of colon 05/24/2011     Past Surgical History  Procedure Laterality Date  . Back surgery    . Shoulder surgery    . Tumor removal  Tumor removed R breast  . Lumbar disc surgery    . Partial hysterectomy    . Colonoscopy  05/03/2011    Procedure: COLONOSCOPY;  Surgeon: Jeryl Columbia, MD;  Location: [facility] ENDOSCOPY;  Service: Endoscopy;  Laterality: N/A;  . Colon surgery  04/2011  . Breast lumpectomy  10/10/2010    Rt Breast  . Joint replacement      R&L total shoulder replacements      Prior to Admission medications   Medication Sig Start Date End Date Taking? Authorizing Provider  ALPRAZolam Duanne Moron) 0.5 MG tablet Take 0.5 mg by mouth 3 (three) times daily as needed for anxiety.    Yes Historical Provider, MD  anastrozole (ARIMIDEX) 1 MG tablet Take 1 tablet (1 mg total) by mouth at bedtime. 05/20/14  Yes Truitt Merle, MD  buPROPion (WELLBUTRIN XL) 150 MG 24 hr tablet  Take 150 mg by mouth daily.   Yes Historical Provider, MD  docusate sodium (COLACE) 100 MG capsule Take 200 mg by mouth at bedtime.   Yes Historical Provider, MD  fluvoxaMINE (LUVOX) 50 MG tablet Take 50-100 mg by mouth 2 (two) times daily. She takes one tablet in the morning and two tablets in the evening.   Yes Historical Provider, MD  furosemide (LASIX) 20 MG tablet Take 20 mg by mouth daily.   Yes Historical Provider, MD  naproxen sodium (ANAPROX) 220 MG tablet Take 220 mg by mouth 3 (three) times daily as needed. For headache pain   Yes Historical  Provider, MD  OxyCODONE HCl ER (OXYCONTIN) 60 MG T12A Take 60 mg by mouth every 8 (eight) hours.   Yes Historical Provider, MD  Potassium Gluconate 595 MG CAPS Take 595 mg by mouth 2 (two) times daily.    Yes Historical Provider, MD  rOPINIRole (REQUIP) 0.5 MG tablet Take 0.5 mg by mouth at bedtime.   Yes Historical Provider, MD  potassium chloride (K-DUR) 10 MEQ tablet Take 1 tablet (10 mEq total) by mouth 2 (two) times daily. Patient not taking: Reported on 08/21/2014 08/17/14   Delice Bison Ward, DO     No Known Allergies  Social History:  reports that she has quit smoking. Her smoking use included Cigarettes. She smoked 1.00 pack per day. She quit smokeless tobacco use about 4 years ago. She reports that she does not drink alcohol or use illicit drugs.    Family History  Problem Relation Age of Onset  . Diabetes Mother   . Hypertension Mother   . Cancer Mother     leukemia  . Sudden death Mother   . Anesthesia problems Neg Hx   . Hypotension Neg Hx   . Malignant hyperthermia Neg Hx   . Pseudochol deficiency Neg Hx        Physical Exam:  GEN:  Pleasant Elderly Thin   69 y.o. Caucasian female examined and in no acute distress; cooperative with exam Filed Vitals:   08/21/14 2326 08/21/14 2331 08/21/14 2332 08/21/14 2334  BP: 183/69 168/74 180/75 186/85  Pulse: 64 62 72 77  Temp:      TempSrc:      Resp: 16     Weight:      SpO2: 99%      Blood pressure 186/85, pulse 77, temperature 97.7 F (36.5 C), temperature source Oral, resp. rate 16, weight 62.596 kg (138 lb), SpO2 99 %. PSYCH: She is alert and oriented x4; does not appear anxious does not appear depressed; affect is normal HEENT: Normocephalic and + Left Facial Ecchymosis with differing stages of healing, Contusion  Mucous membranes pink; PERRLA; EOM intact;    Fundi:  Benign;  No scleral icterus, Nares: Patent, Oropharynx: Clear, Edentulous on Upper Palate,    Neck:  FROM, No Cervical Lymphadenopathy nor Thyromegaly or  Carotid Bruit; No JVD; Breasts:: Not examined CHEST WALL: No tenderness CHEST: Normal respiration, clear to auscultation bilaterally HEART: Regular rate and rhythm; no murmurs rubs or gallops BACK: No kyphosis or scoliosis; No CVA tenderness ABDOMEN: Positive Bowel Sounds, Scaphoid, Soft Non-Tender, No Rebound or Guarding; No Masses, No Organomegaly. Rectal Exam: Not done EXTREMITIES: No Cyanosis, Clubbing, or Edema; Multiple Varicosities of the BLEs, No Ulcerations. Genitalia: not examined PULSES: 2+ and symmetric SKIN: Normal hydration no rash or ulceration CNS:  Alert and Oriented x 4, No Focal Deficits Gait: deferred  Vascular: pulses palpable throughout  Labs on Admission:  Basic Metabolic Panel:  Recent Labs Lab 08/17/14 1815 08/21/14 1729  NA 140 141  K 2.7* 3.3*  CL 101 104  CO2 31 26  GLUCOSE 113* 104*  BUN 24* 24*  CREATININE 1.35* 1.21*  CALCIUM 9.4 9.5   Liver Function Tests:  Recent Labs Lab 08/17/14 1815  AST 35  ALT 32  ALKPHOS 89  BILITOT 0.3  PROT 6.3  ALBUMIN 4.0   No results for input(s): LIPASE, AMYLASE in the last 168 hours. No results for input(s): AMMONIA in the last 168 hours. CBC:  Recent Labs Lab 08/17/14 1815 08/21/14 1729  WBC 4.9 4.6  NEUTROABS 2.5  --   HGB 12.5 11.4*  HCT 37.1 34.3*  MCV 87.5 88.6  PLT 233 230   Cardiac Enzymes:  Recent Labs Lab 08/17/14 1815 08/17/14 2240  TROPONINI <0.03 <0.03    BNP (last 3 results) No results for input(s): BNP in the last 8760 hours.  ProBNP (last 3 results) No results for input(s): PROBNP in the last 8760 hours.  CBG: No results for input(s): GLUCAP in the last 168 hours.  Radiological Exams on Admission: Dg Chest 2 View  08/21/2014   CLINICAL DATA:  Multiple recent falls  EXAM: CHEST  2 VIEW  COMPARISON:  03/24/2012  FINDINGS: Multiple old healed right rib fracture deformities are again evident. The lungs are clear. The pulmonary vasculature is normal. There are no  pleural effusions. Cardiac, mediastinal and hilar contours are unremarkable and unchanged. There is no effusion.  IMPRESSION: No active cardiopulmonary disease.   Electronically Signed   By: Andreas Newport M.D.   On: 08/21/2014 21:47   Dg Thoracic Spine 2 View  08/21/2014   CLINICAL DATA:  Multiple falls recently, with mid back pain.  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 There is no evidence of acute thoracic vertebral fracture. The upper portions of the lumbar fixation hardware are visible and appear intact. There are severe degenerative disc changes in the mid to lower thoracic spine with degenerative -appearing retrolisthesis at multiple levels. This appears unchanged from the CT of 08/17/2014. No bone lesion or bony destruction is evident.  IMPRESSION: Kyphoscoliosis and severe degenerative disc changes. No evidence of acute fracture.   Electronically Signed   By: Andreas Newport M.D.   On: 08/21/2014 21:46   Dg Lumbar Spine Complete  08/21/2014   CLINICAL DATA:  Multiple falls. Most recent yesterday. Low back pain.  EXAM: LUMBAR SPINE - COMPLETE 4+ VIEW  COMPARISON:  CT of 05/03/2011.  FINDINGS: Convex left lumbar spine curvature. Advanced atherosclerosis involving the aorta. Status post trans pedicle screw fixation at L2-5. No acute hardware complication. Advanced lower thoracic and upper lumbar spondylosis. Loss of intervertebral disc height. Maintenance of vertebral body height.  IMPRESSION: Spondylosis and surgical changes.  No acute osseous abnormality.   Electronically Signed   By: Abigail Miyamoto M.D.   On: 08/21/2014 21:48   Dg Pelvis 1-2 Views  08/21/2014   CLINICAL DATA:  Multiple falls recently  EXAM: PELVIS - 1-2 VIEW  COMPARISON:  01/01/2012  FINDINGS: There is no evidence of pelvic fracture or diastasis. No pelvic bone lesions are seen.  IMPRESSION: Negative.   Electronically Signed   By: Andreas Newport  M.D.   On: 08/21/2014 21:48   Dg Shoulder Right  08/21/2014   CLINICAL DATA:  Multiple fall since 07/29/2014. Right shoulder pain.  EXAM: RIGHT  SHOULDER - 2+ VIEW  COMPARISON:  08/17/2014 humerus films.  FINDINGS: Right shoulder arthroplasty. Visualized portion of the right hemithorax is normal. Surgical clips which project over the right proximal humerus or axilla. The humeral head component of the arthroplasty projects anterior to the central glenoid on the scapular view. No evidence of dislocation on the first image.  IMPRESSION: Right shoulder arthroplasty. Apparent anterior position of the humeral component relative to the glenoid on the scapular view. This could be projectional. If there is a concern of dislocation, consider further radiographic evaluation.   Electronically Signed   By: Abigail Miyamoto M.D.   On: 08/21/2014 21:44   Ct Head Wo Contrast  08/21/2014   CLINICAL DATA:  Syncope and panic attacks.  Recent falls.  EXAM: CT HEAD WITHOUT CONTRAST  CT MAXILLOFACIAL WITHOUT CONTRAST  CT CERVICAL SPINE WITHOUT CONTRAST  TECHNIQUE: Multidetector CT imaging of the head, cervical spine, and maxillofacial structures were performed using the standard protocol without intravenous contrast. Multiplanar CT image reconstructions of the cervical spine and maxillofacial structures were also generated.  COMPARISON:  08/17/2014  FINDINGS: CT HEAD FINDINGS  There is a left frontotemporal subdural hematoma measuring about 4 mm in maximum thickness. There is no significant mass effect. There is no midline shift. The basal cisterns remain patent. Moderate generalized atrophy is present, unchanged. There is white matter hypodensity consistent with chronic microvascular ischemic disease. There is an old lacunar infarction in the right caudate. No calvarial or skullbase fracture is evident.  CT MAXILLOFACIAL FINDINGS  No facial or periorbital fracture is evident. Orbital floors are intact. Zygomatic arches and pterygoid  plates are intact. There is right frontal scalp hematoma.  CT CERVICAL SPINE FINDINGS  Portions of the lower cervical spine are excluded from the sagittal reconstructions posteriorly, but are seen to good advantage on the axial and coronal images. There is no evidence of cervical spine fracture. Cervical vertebrae are normal in height. The vertebral column, pedicles and facet articulations are intact with no evidence of acute fracture. No acute soft tissue abnormality is evident. Moderately severe facet arthritis is present at multiple levels.  IMPRESSION: 1. Left frontotemporal subdural hematoma, 4 mm. No mass effect or midline shift. 2. Chronic atrophy and microvascular ischemic disease. 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 3. Negative for acute maxillofacial fracture. 4. Negative for acute cervical spine fracture.  These results were called by telephone at the time of interpretation on 08/21/2014 at 10:29 pm to Dr. Waynetta Pean , who verbally acknowledged these results.   Electronically Signed   By: Andreas Newport M.D.   On: 08/21/2014 22:33   Ct Cervical Spine Wo Contrast  08/21/2014   CLINICAL DATA:  Syncope and panic attacks.  Recent falls.  EXAM: CT HEAD WITHOUT CONTRAST  CT MAXILLOFACIAL WITHOUT CONTRAST  CT CERVICAL SPINE WITHOUT CONTRAST  TECHNIQUE: Multidetector CT imaging of the head, cervical spine, and maxillofacial structures were performed using the standard protocol without intravenous contrast. Multiplanar CT image reconstructions of the cervical spine and maxillofacial structures were also generated.  COMPARISON:  08/17/2014  FINDINGS: CT HEAD FINDINGS  There is a left frontotemporal subdural hematoma measuring about 4 mm in maximum thickness. There is no significant mass effect. There is no midline shift. The basal cisterns remain patent. Moderate generalized atrophy is present, unchanged. There is white matter hypodensity consistent with chronic microvascular ischemic disease. There  is an old lacunar infarction in the right caudate. No calvarial or skullbase fracture is evident.  CT MAXILLOFACIAL  FINDINGS  No facial or periorbital fracture is evident. Orbital floors are intact. Zygomatic arches and pterygoid plates are intact. There is right frontal scalp hematoma.  CT CERVICAL SPINE FINDINGS  Portions of the lower cervical spine are excluded from the sagittal reconstructions posteriorly, but are seen to good advantage on the axial and coronal images. There is no evidence of cervical spine fracture. Cervical vertebrae are normal in height. The vertebral column, pedicles and facet articulations are intact with no evidence of acute fracture. No acute soft tissue abnormality is evident. Moderately severe facet arthritis is present at multiple levels.  IMPRESSION: 1. Left frontotemporal subdural hematoma, 4 mm. No mass effect or midline shift. 2. Chronic atrophy and microvascular ischemic disease. 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 3. Negative for acute maxillofacial fracture. 4. Negative for acute cervical spine fracture.  These results were called by telephone at the time of interpretation on 08/21/2014 at 10:29 pm to Dr. Waynetta Pean , who verbally acknowledged these results.   Electronically Signed   By: Andreas Newport M.D.   On: 08/21/2014 22:33   Dg Knee Complete 4 Views Left  08/21/2014   CLINICAL DATA:  Multiple falls since 07/29/2014.  EXAM: LEFT KNEE - COMPLETE 4+ VIEW  COMPARISON:  08/17/2014  FINDINGS: Negative for acute fracture, dislocation or radiopaque foreign body. There is chondrocalcinosis. No bone lesion or bony destruction is evident.  IMPRESSION: Negative for acute fracture.   Electronically Signed   By: Andreas Newport M.D.   On: 08/21/2014 21:43   Dg Femur Min 2 Views Left  08/21/2014   CLINICAL DATA:  Multiple falls, left hip pain  EXAM: LEFT FEMUR 2 VIEWS  COMPARISON:  None.  FINDINGS: No fracture or dislocation is seen.  Degenerative changes of the left  hip and knee.  Visualized bony pelvis appears intact.  Vascular calcifications.  IMPRESSION: No fracture or dislocation is seen.   Electronically Signed   By: Julian Hy M.D.   On: 08/21/2014 21:45   Ct Maxillofacial Wo Cm  08/21/2014   CLINICAL DATA:  Syncope and panic attacks.  Recent falls.  EXAM: CT HEAD WITHOUT CONTRAST  CT MAXILLOFACIAL WITHOUT CONTRAST  CT CERVICAL SPINE WITHOUT CONTRAST  TECHNIQUE: Multidetector CT imaging of the head, cervical spine, and maxillofacial structures were performed using the standard protocol without intravenous contrast. Multiplanar CT image reconstructions of the cervical spine and maxillofacial structures were also generated.  COMPARISON:  08/17/2014  FINDINGS: CT HEAD FINDINGS  There is a left frontotemporal subdural hematoma measuring about 4 mm in maximum thickness. There is no significant mass effect. There is no midline shift. The basal cisterns remain patent. Moderate generalized atrophy is present, unchanged. There is white matter hypodensity consistent with chronic microvascular ischemic disease. There is an old lacunar infarction in the right caudate. No calvarial or skullbase fracture is evident.  CT MAXILLOFACIAL FINDINGS  No facial or periorbital fracture is evident. Orbital floors are intact. Zygomatic arches and pterygoid plates are intact. There is right frontal scalp hematoma.  CT CERVICAL SPINE FINDINGS  Portions of the lower cervical spine are excluded from the sagittal reconstructions posteriorly, but are seen to good advantage on the axial and coronal images. There is no evidence of cervical spine fracture. Cervical vertebrae are normal in height. The vertebral column, pedicles and facet articulations are intact with no evidence of acute fracture. No acute soft tissue abnormality is evident. Moderately severe facet arthritis is present at multiple levels.  IMPRESSION: 1. Left frontotemporal  subdural hematoma, 4 mm. No mass effect or midline shift.  2. Chronic atrophy and microvascular ischemic disease. 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 3. Negative for acute maxillofacial fracture. 4. Negative for acute cervical spine fracture.  These results were called by telephone at the time of interpretation on 08/21/2014 at 10:29 pm to Dr. Waynetta Pean , who verbally acknowledged these results.   Electronically Signed   By: Andreas Newport M.D.   On: 08/21/2014 22:33     EKG: Independently reviewed. Normal Sinus Rhythm Rate=66, No Acute S-T changes   Assessment/Plan:   69 y.o. female with  Principal Problem:   1.    Subdural hematoma   NeuroSurgey to See this AM   Telemetry Monitoring   Neuro Checks   Check Orthostatic Vitals with Caution   Repeat Ct scan of the Brain in AM        Active Problems:   2.   Frequent falls   Physical therapy consultation in AM   Fall Precautions     3.   Hypokalemia- Mild, due to Lasix Rx   Replace K+   Check Magnesium Level       4.   HTN (hypertension)   Continue Lasix Rx   Monitor BPs       5.   Cancer of colon   stable     6.   H/O Breast cancer status post lumpectomy, radiation therapy   On Arimidex Rx     7.   Facial contusion   Ice pack TID x 2 days      8.   DVT Prophylaxis   SCDs          Code Status:     FULL CODE      Family Communication:   Family at Bedside       Disposition Plan:   Observation Status        Time spent:  [facility] Pager [phone]   If [place] Please Contact the Day Rounding Team MD for [facility]  If 7PM-7AM, Please Contact Night-Floor Coverage  [email or web address] Password [email or web address] 08/22/2014, 12:34 AM     ADDENDUM:   Patient was seen and examined on 08/22/2014

## 2014-08-22 NOTE — Progress Notes (Signed)
Patient ID: [name], female   DOB: Apr 14, 1946, 69 y.o.   MRN: [name] Note dictated

## 2014-08-22 NOTE — Progress Notes (Signed)
Pt seen and examined this am. Has some HA, no visual changes, N/V, or N/T/W.  EXAM:  BP 145/59 mmHg  Pulse 60  Temp(Src) 97.5 F (36.4 C) (Oral)  Resp 18  Ht 5\' 3"  (1.6 m)  Wt 59.6 kg (131 lb 6.3 oz)  BMI 23.28 kg/m2  SpO2 98%  Awake, alert, oriented  Speech fluent, appropriate  CN grossly intact  5/5 BUE/BLE   IMPRESSION:  69 y.o. female s/p fall with small left SDH, neurologically intact  PLAN: - Will plan on repeating CTH this am.

## 2014-08-22 NOTE — Progress Notes (Signed)
69 year old lady with h/o hypertension, admitted for a fall earlier this am. She was found to have a small sub dural hematoma. Repeat CT this am shows stable hematoma. Recommend PT/ot EVAL .  Monitor.   Hosie Poisson, MD [phone]

## 2014-08-22 NOTE — Progress Notes (Signed)
Pt arrived to 4N17 alert and oriented. Oriented to room. Questions answered. Will continue to monitor. Bobbye Charleston, RN

## 2014-08-23 DIAGNOSIS — R55 Syncope and collapse: Secondary | ICD-10-CM

## 2014-08-23 DIAGNOSIS — S0083XD Contusion of other part of head, subsequent encounter: Secondary | ICD-10-CM

## 2014-08-23 LAB — BASIC METABOLIC PANEL
ANION GAP: 7 (ref 5–15)
BUN: 16 mg/dL (ref 6–23)
CALCIUM: 9.3 mg/dL (ref 8.4–10.5)
CO2: 27 mmol/L (ref 19–32)
Chloride: 107 mmol/L (ref 96–112)
Creatinine, Ser: 1.2 mg/dL — ABNORMAL HIGH (ref 0.50–1.10)
GFR, EST AFRICAN AMERICAN: 53 mL/min — AB (ref >90)
GFR, EST NON AFRICAN AMERICAN: 46 mL/min — AB (ref >90)
GLUCOSE: 172 mg/dL — AB (ref 70–99)
POTASSIUM: 3.7 mmol/L (ref 3.5–5.1)
Sodium: 141 mmol/L (ref 135–145)

## 2014-08-23 MED ORDER — HYDRALAZINE HCL 20 MG/ML IJ SOLN
5.0000 mg | INTRAMUSCULAR | Status: DC | PRN
  Filled 2014-08-23: qty 1

## 2014-08-23 MED ORDER — OXYCODONE HCL ER 40 MG PO T12A: 40.0000 mg | EXTENDED_RELEASE_TABLET | Freq: Three times a day (TID) | ORAL | Status: DC

## 2014-08-23 MED ORDER — LORAZEPAM 2 MG/ML IJ SOLN: 1.0000 mg | Freq: Once | INTRAMUSCULAR | Status: DC

## 2014-08-23 MED ORDER — ALPRAZOLAM 0.5 MG PO TABS: 1.0000 mg | ORAL_TABLET | Freq: Every day | ORAL | Status: DC | PRN

## 2014-08-23 NOTE — Progress Notes (Signed)
[facility] PROGRESS NOTE  [name] [id number] DOB: 03/29/1946 DOA: 08/21/2014 PCP: No PCP Per Patient  Assessment/Plan: 1. Syncope: - unclear etiology. Echo is not significant.  orthostatic vital signs are negative. Telemetry monitoring showed sinus arrhythmias with PVC'S. Her two troponins are negative.  Will request cardiology consult for further evaluation.    2. Small SDH: Stable on repeat CT head. Neuro surgery consulted and recommendations given.    3. Mild anemia: Stable.    Hypokalemia: replete as needed.    Mild stage 1 CKD: Improved with hydration.   Accelerated hypertension: IV prn hydralazine and resume home meds.        Code Status: fullc ode.  Family Communication: none at bedside Disposition Plan: pending, possibly home in am.    Consultants:  Neuro surgery.   Procedures:  Echo  CT head  Antibiotics:  none  HPI/Subjective: Comfortable. No new complaints.   Objective: Filed Vitals:   08/23/14 1736  BP: 173/87  Pulse: 61  Temp: 98.1 F (36.7 C)  Resp: 18    Intake/Output Summary (Last 24 hours) at 08/23/14 1748 Last data filed at 08/23/14 1100  Gross per 24 hour  Intake    600 ml  Output      0 ml  Net    600 ml   Filed Weights   08/21/14 1706 08/22/14 0048  Weight: 62.596 kg (138 lb) 59.6 kg (131 lb 6.3 oz)    Exam:   General:  Alert afebrile comfortable.   Cardiovascular: s1s2  Respiratory: ctab  Abdomen: soft non tender non distended bowel sounds heard  Musculoskeletal: no pedal edema.   Data Reviewed: Basic Metabolic Panel:  Recent Labs Lab 08/17/14 1815 08/21/14 1729 08/22/14 0543 08/23/14 0912  NA 140 141 141 141  K 2.7* 3.3* 3.1* 3.7  CL 101 104 106 107  CO2 31 26 27 27   GLUCOSE 113* 104* 119* 172*  BUN 24* 24* 21 16  CREATININE 1.35* 1.21* 1.17* 1.20*  CALCIUM 9.4 9.5 9.2 9.3  MG  --   --  2.0  --    Liver Function Tests:  Recent Labs Lab 08/17/14 1815  AST 35  ALT  32  ALKPHOS 89  BILITOT 0.3  PROT 6.3  ALBUMIN 4.0   No results for input(s): LIPASE, AMYLASE in the last 168 hours. No results for input(s): AMMONIA in the last 168 hours. CBC:  Recent Labs Lab 08/17/14 1815 08/21/14 1729 08/22/14 0543  WBC 4.9 4.6 4.2  NEUTROABS 2.5  --   --   HGB 12.5 11.4* 11.1*  HCT 37.1 34.3* 33.6*  MCV 87.5 88.6 90.3  PLT 233 230 213   Cardiac Enzymes:  Recent Labs Lab 08/17/14 1815 08/17/14 2240  TROPONINI <0.03 <0.03   BNP (last 3 results) No results for input(s): BNP in the last 8760 hours.  ProBNP (last 3 results) No results for input(s): PROBNP in the last 8760 hours.  CBG:  Recent Labs Lab 08/22/14 0423 08/22/14 0801 08/22/14 1149 08/22/14 1620  GLUCAP 116* 92 110* 107*    No results found for this or any previous visit (from the past 240 hour(s)).   Studies: Dg Chest 2 View  08/21/2014   CLINICAL DATA:  Multiple recent falls  EXAM: CHEST  2 VIEW  COMPARISON:  03/24/2012  FINDINGS: Multiple old healed right rib fracture deformities are again evident. The lungs are clear. The pulmonary vasculature is normal. There are no pleural effusions. Cardiac, mediastinal and hilar contours are  unremarkable and unchanged. There is no effusion.  IMPRESSION: No active cardiopulmonary disease.   Electronically Signed   By: Andreas Newport M.D.   On: 08/21/2014 21:47   Dg Thoracic Spine 2 View  08/21/2014   CLINICAL DATA:  Multiple falls recently, with mid back pain.  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 There is no evidence of acute thoracic vertebral fracture. The upper portions of the lumbar fixation hardware are visible and appear intact. There are severe degenerative disc changes in the mid to lower thoracic spine with degenerative -appearing retrolisthesis at multiple levels. This appears unchanged from the CT of 08/17/2014. No bone lesion or bony destruction is  evident.  IMPRESSION: Kyphoscoliosis and severe degenerative disc changes. No evidence of acute fracture.   Electronically Signed   By: Andreas Newport M.D.   On: 08/21/2014 21:46   Dg Lumbar Spine Complete  08/21/2014   CLINICAL DATA:  Multiple falls. Most recent yesterday. Low back pain.  EXAM: LUMBAR SPINE - COMPLETE 4+ VIEW  COMPARISON:  CT of 05/03/2011.  FINDINGS: Convex left lumbar spine curvature. Advanced atherosclerosis involving the aorta. Status post trans pedicle screw fixation at L2-5. No acute hardware complication. Advanced lower thoracic and upper lumbar spondylosis. Loss of intervertebral disc height. Maintenance of vertebral body height.  IMPRESSION: Spondylosis and surgical changes.  No acute osseous abnormality.   Electronically Signed   By: Abigail Miyamoto M.D.   On: 08/21/2014 21:48   Dg Pelvis 1-2 Views  08/21/2014   CLINICAL DATA:  Multiple falls recently  EXAM: PELVIS - 1-2 VIEW  COMPARISON:  01/01/2012  FINDINGS: There is no evidence of pelvic fracture or diastasis. No pelvic bone lesions are seen.  IMPRESSION: Negative.   Electronically Signed   By: Andreas Newport M.D.   On: 08/21/2014 21:48   Dg Shoulder Right  08/21/2014   CLINICAL DATA:  Multiple fall since 07/29/2014. Right shoulder pain.  EXAM: RIGHT SHOULDER - 2+ VIEW  COMPARISON:  08/17/2014 humerus films.  FINDINGS: Right shoulder arthroplasty. Visualized portion of the right hemithorax is normal. Surgical clips which project over the right proximal humerus or axilla. The humeral head component of the arthroplasty projects anterior to the central glenoid on the scapular view. No evidence of dislocation on the first image.  IMPRESSION: Right shoulder arthroplasty. Apparent anterior position of the humeral component relative to the glenoid on the scapular view. This could be projectional. If there is a concern of dislocation, consider further radiographic evaluation.   Electronically Signed   By: Abigail Miyamoto M.D.   On:  08/21/2014 21:44   Ct Head Wo Contrast  08/22/2014   CLINICAL DATA:  Followup for subdural hematoma. Multiple recent falls.  EXAM: CT HEAD WITHOUT CONTRAST  TECHNIQUE: Contiguous axial images were obtained from the base of the skull through the vertex without intravenous contrast.  COMPARISON:  08/21/2014  FINDINGS: Minimal subdural hemorrhage along the left lateral frontal lobe is unchanged. There is no new intracranial hemorrhage.  Ventricles are normal configuration. Ventricular sulcal enlargement is noted reflecting Mild to moderate stable atrophy. Old lacune infarct in the right caudate nucleus head. Patchy areas of white matter hypoattenuation are noted consistent with moderate chronic microvascular ischemic change. No evidence of a recent cortical infarct.  Mild right periorbital soft tissue hematoma/ swelling.  Visualized sinuses and mastoid air cells are clear. No skull fracture.  IMPRESSION: 1. No change from the previous day's study.  2. Minimal left-sided subdural hemorrhage is stable. No new intracranial hemorrhage.   Electronically Signed   By: Lajean Manes M.D.   On: 08/22/2014 09:35   Ct Head Wo Contrast  08/21/2014   CLINICAL DATA:  Syncope and panic attacks.  Recent falls.  EXAM: CT HEAD WITHOUT CONTRAST  CT MAXILLOFACIAL WITHOUT CONTRAST  CT CERVICAL SPINE WITHOUT CONTRAST  TECHNIQUE: Multidetector CT imaging of the head, cervical spine, and maxillofacial structures were performed using the standard protocol without intravenous contrast. Multiplanar CT image reconstructions of the cervical spine and maxillofacial structures were also generated.  COMPARISON:  08/17/2014  FINDINGS: CT HEAD FINDINGS  There is a left frontotemporal subdural hematoma measuring about 4 mm in maximum thickness. There is no significant mass effect. There is no midline shift. The basal cisterns remain patent. Moderate generalized atrophy is present, unchanged. There is white matter hypodensity consistent with chronic  microvascular ischemic disease. There is an old lacunar infarction in the right caudate. No calvarial or skullbase fracture is evident.  CT MAXILLOFACIAL FINDINGS  No facial or periorbital fracture is evident. Orbital floors are intact. Zygomatic arches and pterygoid plates are intact. There is right frontal scalp hematoma.  CT CERVICAL SPINE FINDINGS  Portions of the lower cervical spine are excluded from the sagittal reconstructions posteriorly, but are seen to good advantage on the axial and coronal images. There is no evidence of cervical spine fracture. Cervical vertebrae are normal in height. The vertebral column, pedicles and facet articulations are intact with no evidence of acute fracture. No acute soft tissue abnormality is evident. Moderately severe facet arthritis is present at multiple levels.  IMPRESSION: 1. Left frontotemporal subdural hematoma, 4 mm. No mass effect or midline shift. 2. Chronic atrophy and microvascular ischemic disease. 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 3. Negative for acute maxillofacial fracture. 4. Negative for acute cervical spine fracture.  These results were called by telephone at the time of interpretation on 08/21/2014 at 10:29 pm to Dr. Waynetta Pean , who verbally acknowledged these results.   Electronically Signed   By: Andreas Newport M.D.   On: 08/21/2014 22:33   Ct Cervical Spine Wo Contrast  08/21/2014   CLINICAL DATA:  Syncope and panic attacks.  Recent falls.  EXAM: CT HEAD WITHOUT CONTRAST  CT MAXILLOFACIAL WITHOUT CONTRAST  CT CERVICAL SPINE WITHOUT CONTRAST  TECHNIQUE: Multidetector CT imaging of the head, cervical spine, and maxillofacial structures were performed using the standard protocol without intravenous contrast. Multiplanar CT image reconstructions of the cervical spine and maxillofacial structures were also generated.  COMPARISON:  08/17/2014  FINDINGS: CT HEAD FINDINGS  There is a left frontotemporal subdural hematoma measuring about 4 mm  in maximum thickness. There is no significant mass effect. There is no midline shift. The basal cisterns remain patent. Moderate generalized atrophy is present, unchanged. There is white matter hypodensity consistent with chronic microvascular ischemic disease. There is an old lacunar infarction in the right caudate. No calvarial or skullbase fracture is evident.  CT MAXILLOFACIAL FINDINGS  No facial or periorbital fracture is evident. Orbital floors are intact. Zygomatic arches and pterygoid plates are intact. There is right frontal scalp hematoma.  CT CERVICAL SPINE FINDINGS  Portions of the lower cervical spine are excluded from the sagittal reconstructions posteriorly, but are seen to good advantage on the axial and coronal images. There is no evidence of cervical spine fracture. Cervical vertebrae are normal in height. The vertebral column, pedicles and facet articulations are intact with no evidence  of acute fracture. No acute soft tissue abnormality is evident. Moderately severe facet arthritis is present at multiple levels.  IMPRESSION: 1. Left frontotemporal subdural hematoma, 4 mm. No mass effect or midline shift. 2. Chronic atrophy and microvascular ischemic disease. 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 3. Negative for acute maxillofacial fracture. 4. Negative for acute cervical spine fracture.  These results were called by telephone at the time of interpretation on 08/21/2014 at 10:29 pm to Dr. Waynetta Pean , who verbally acknowledged these results.   Electronically Signed   By: Andreas Newport M.D.   On: 08/21/2014 22:33   Dg Knee Complete 4 Views Left  08/21/2014   CLINICAL DATA:  Multiple falls since 07/29/2014.  EXAM: LEFT KNEE - COMPLETE 4+ VIEW  COMPARISON:  08/17/2014  FINDINGS: Negative for acute fracture, dislocation or radiopaque foreign body. There is chondrocalcinosis. No bone lesion or bony destruction is evident.  IMPRESSION: Negative for acute fracture.   Electronically Signed    By: Andreas Newport M.D.   On: 08/21/2014 21:43   Dg Femur Min 2 Views Left  08/21/2014   CLINICAL DATA:  Multiple falls, left hip pain  EXAM: LEFT FEMUR 2 VIEWS  COMPARISON:  None.  FINDINGS: No fracture or dislocation is seen.  Degenerative changes of the left hip and knee.  Visualized bony pelvis appears intact.  Vascular calcifications.  IMPRESSION: No fracture or dislocation is seen.   Electronically Signed   By: Julian Hy M.D.   On: 08/21/2014 21:45   Ct Maxillofacial Wo Cm  08/21/2014   CLINICAL DATA:  Syncope and panic attacks.  Recent falls.  EXAM: CT HEAD WITHOUT CONTRAST  CT MAXILLOFACIAL WITHOUT CONTRAST  CT CERVICAL SPINE WITHOUT CONTRAST  TECHNIQUE: Multidetector CT imaging of the head, cervical spine, and maxillofacial structures were performed using the standard protocol without intravenous contrast. Multiplanar CT image reconstructions of the cervical spine and maxillofacial structures were also generated.  COMPARISON:  08/17/2014  FINDINGS: CT HEAD FINDINGS  There is a left frontotemporal subdural hematoma measuring about 4 mm in maximum thickness. There is no significant mass effect. There is no midline shift. The basal cisterns remain patent. Moderate generalized atrophy is present, unchanged. There is white matter hypodensity consistent with chronic microvascular ischemic disease. There is an old lacunar infarction in the right caudate. No calvarial or skullbase fracture is evident.  CT MAXILLOFACIAL FINDINGS  No facial or periorbital fracture is evident. Orbital floors are intact. Zygomatic arches and pterygoid plates are intact. There is right frontal scalp hematoma.  CT CERVICAL SPINE FINDINGS  Portions of the lower cervical spine are excluded from the sagittal reconstructions posteriorly, but are seen to good advantage on the axial and coronal images. There is no evidence of cervical spine fracture. Cervical vertebrae are normal in height. The vertebral column, pedicles and  facet articulations are intact with no evidence of acute fracture. No acute soft tissue abnormality is evident. Moderately severe facet arthritis is present at multiple levels.  IMPRESSION: 1. Left frontotemporal subdural hematoma, 4 mm. No mass effect or midline shift. 2. Chronic atrophy and microvascular ischemic disease. 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 3. Negative for acute maxillofacial fracture. 4. Negative for acute cervical spine fracture.  These results were called by telephone at the time of interpretation on 08/21/2014 at 10:29 pm to Dr. Waynetta Pean , who verbally acknowledged these results.   Electronically Signed   By: Andreas Newport M.D.   On: 08/21/2014 22:33  Scheduled Meds: . anastrozole  1 mg Oral QHS  . buPROPion  150 mg Oral Daily  . docusate sodium  200 mg Oral QHS  . fluvoxaMINE  50 mg Oral q morning - 10a   And  . fluvoxaMINE  100 mg Oral QPM  . furosemide  20 mg Oral Daily  . LORazepam  1 mg Intravenous Once  . OxyCODONE  40 mg Oral 3 times per day  . rOPINIRole  0.5 mg Oral QHS  . sodium chloride  3 mL Intravenous Q12H   Continuous Infusions:   Principal Problem:   Subdural hematoma Active Problems:   Hypokalemia   HTN (hypertension)   Cancer of colon   H/O Breast cancer status post lumpectomy, radiation therapy   Frequent falls   Facial contusion    Time spent: 25 minutes/    [facility] Pager [phone]. If 7PM-7AM, please contact night-coverage at [email or web address], password [facility] 08/23/2014, 5:48 PM  LOS: 1 day

## 2014-08-23 NOTE — Evaluation (Signed)
Physical Therapy Evaluation Patient Details Name: [name] MRN: [id number] DOB: 03/30/1946 Today's Date: 08/23/2014   History of Present Illness  69 yo female with onset of SDH after a syncopal episode at home  Clinical Impression  Pt was seen for evaluation of her balance and strength given her fall leading to SDH.  Her ability to manage is good for home but has traumatized L patella and would benefit from outpt treatment to this.  Her plan is to go home and has not agreed to tx, but nursing aware of this as well.      Follow Up Recommendations Outpatient PT;Supervision - Intermittent    Equipment Recommendations  None recommended by PT    Recommendations for Other Services       Precautions / Restrictions Precautions Precautions: Fall Restrictions Weight Bearing Restrictions: No      Mobility  Bed Mobility Overal bed mobility: Modified Independent                Transfers Overall transfer level: Modified independent Equipment used: None                Ambulation/Gait Ambulation/Gait assistance: Supervision Ambulation Distance (Feet): 200 Feet Assistive device: None (Supervised for safety) Gait Pattern/deviations: Step-through pattern;Wide base of support;Drifts right/left;Decreased weight shift to left Gait velocity: average Gait velocity interpretation: at or above normal speed for age/gender General Gait Details: pt is protective of LLE due to falling on L shin and knee prior to admission.  Stairs Stairs: Yes Stairs assistance: Supervision Stair Management: One rail Left Number of Stairs: 7 General stair comments: Practiced her entry stairs to home with pt succeeding when she uses hand hold onto railing  Wheelchair Mobility    Modified Rankin (Stroke Patients Only)       Balance Overall balance assessment: Modified Independent                                           Pertinent Vitals/Pain Pain Assessment:  Faces Pain Score: 5  Faces Pain Scale: Hurts even more Pain Location: L lower leg from fall Pain Intervention(s): Limited activity within patient's tolerance;Repositioned (Better at rest)    Home Living Family/patient expects to be discharged to:: Private residence Living Arrangements: Alone Available Help at Discharge: Family;Friend(s);Available PRN/intermittently Type of Home: House Home Access: Stairs to enter Entrance Stairs-Rails: Left Entrance Stairs-Number of Steps: 7 Home Layout: One level Home Equipment: None      Prior Function Level of Independence: Independent               Hand Dominance   Dominant Hand: Right    Extremity/Trunk Assessment   Upper Extremity Assessment: Overall WFL for tasks assessed           Lower Extremity Assessment: Overall WFL for tasks assessed      Cervical / Trunk Assessment: Kyphotic  Communication   Communication: No difficulties  Cognition Arousal/Alertness: Awake/alert Behavior During Therapy: Anxious;Impulsive Overall Cognitive Status: Difficult to assess                      General Comments General comments (skin integrity, edema, etc.): Pt has LLE bruising from a fall with pt asking PT not to touch the skin there    Exercises        Assessment/Plan    PT Assessment All further PT needs can be  met in the next venue of care  PT Diagnosis Acute pain   PT Problem List Pain;Decreased skin integrity;Cardiopulmonary status limiting activity;Decreased activity tolerance  PT Treatment Interventions     PT Goals (Current goals can be found in the Care Plan section) Acute Rehab PT Goals Patient Stated Goal: To go home today    Frequency     Barriers to discharge        Co-evaluation               End of Session   Activity Tolerance: Patient tolerated treatment well;Patient limited by pain Patient left: in chair Nurse Communication: Mobility status;Precautions         Time:  1610-9604 PT Time Calculation (min) (ACUTE ONLY): 29 min   Charges:   PT Evaluation $Initial PT Evaluation Tier I: 1 Procedure PT Treatments $Gait Training: 8-22 mins   PT G Codes:        Ramond Dial August 27, 2014, 1:12 PM   Mee Hives, PT MS Acute Rehab Dept. Number: [phone]

## 2014-08-23 NOTE — Progress Notes (Signed)
Patient ID: [name], female   DOB: December 17, 1945, 69 y.o.   MRN: [name] Neuro stable, ct head ,small sdh. No need for surgical intervention. If discharge we will f/u her in the office

## 2014-08-23 NOTE — Progress Notes (Signed)
Echocardiogram 2D Echocardiogram has been performed.  [name] 08/23/2014, 11:17 AM

## 2014-08-24 DIAGNOSIS — I10 Essential (primary) hypertension: Secondary | ICD-10-CM

## 2014-08-24 DIAGNOSIS — E876 Hypokalemia: Secondary | ICD-10-CM

## 2014-08-24 DIAGNOSIS — R296 Repeated falls: Secondary | ICD-10-CM

## 2014-08-24 NOTE — Progress Notes (Signed)
PT Cancellation Note  Patient Details Name: [name] MRN: [name] DOB: July 19, 1945   Cancelled Treatment:    Reason Eval/Treat Not Completed: Patient declined, no reason specified;Other (comment) (Planning to go home today).  Will check if here tomorrow   Ramond Dial 08/24/2014, 11:20 AM   Mee Hives, PT [facility] Acute Rehab Dept. Number: [phone]

## 2014-08-24 NOTE — Progress Notes (Signed)
Pt discharged to home. Alert and oriented x4. Small headache but pain medicine relieving at this time. No signs of respiratory distress. Education complete and care plans resolved. IV removed. Pt tolerated well. Pt discharged via wheelchair with no issues at this time. Leanne Chang , RN

## 2014-08-24 NOTE — Progress Notes (Signed)
Late entry Talked to patient this am about Outpatient PT; patient is agreeable to go to Outpatient PT and chose [facility]; Orders and clinical information faxed; the rehab facility will contact the patient at home for a start up date and time for therapy; Aneta Mins [phone]

## 2014-08-24 NOTE — Consult Note (Addendum)
CARDIOLOGY CONSULT NOTE   Patient ID: [name] MRN: [name], DOB/AGE: 23-Jul-1945   Admit date: 08/21/2014 Date of Consult: 08/24/2014   Primary Physician: No PCP Per Patient.  She is followed by Dr. Nicholaus Bloom at pain clinic Primary Cardiologist: None  Pt. Profile  69 year old woman admitted after a fall.  She has a history of multiple falls.  Noted to have a small left subdural hematoma.  Problem List  Past Medical History  Diagnosis Date  . Hypertension   . Depression   . Back pain, chronic   . Breast cancer 09/09/10  . Cancer of colon 05/24/2011    Past Surgical History  Procedure Laterality Date  . Back surgery    . Shoulder surgery    . Tumor removal  Tumor removed R breast  . Lumbar disc surgery    . Partial hysterectomy    . Colonoscopy  05/03/2011    Procedure: COLONOSCOPY;  Surgeon: Jeryl Columbia, MD;  Location: [facility] ENDOSCOPY;  Service: Endoscopy;  Laterality: N/A;  . Colon surgery  04/2011  . Breast lumpectomy  10/10/2010    Rt Breast  . Joint replacement      R&L total shoulder replacements     Allergies  No Known Allergies  HPI   This 69 year old woman was admitted after being found to have a small left subdural hematoma.  She has had a history of multiple falls.  She had been seen several days earlier for a fall as well.  He has a past history of chronic pain syndrome and is followed by Dr. Nicholaus Bloom at the pain clinic.  She has a history of depression and a history of panic attacks.  She has a past history of breast cancer and is on anastrozole.  He has a history of restless leg syndrome and is on ropinirole.  She does not have any history of known heart problems.  She does have a history of fluid retention and has been on chronic low-dose furosemide 20 mg daily with potassium supplementation.  She does not have any family history of cardiac arrhythmia or recurrent syncope.  She states that often she will be standing at the sink in her kitchen  and she will feel herself getting lightheaded.  When this happens she tries to grab hold of some furniture or sit down quickly.  Sometimes this is successful and at other times she wakes up to find herself on the floor.  Frequency of these episodes appears to be increasing.  He does not have any history of exertional chest discomfort or symptoms of congestive heart failure. Workup in the hospital so far includes echocardiogram on 08/23/14 with results as noted below: - Left ventricle: The cavity size was normal. Wall thickness was normal. Systolic function was normal. The estimated ejection fraction was in the range of 55% to 60%. Wall motion was normal; there were no regional wall motion abnormalities. Doppler parameters are consistent with abnormal left ventricular relaxation (grade 1 diastolic dysfunction). The E/e&' ratio is <8, suggesting normal LV filling pressure. - Mitral valve: Mildly thickened leaflets . There was mild regurgitation. - Left atrium: The atrium was normal in size. - Atrial septum: A septal defect cannot be excluded by color doppler. - Tricuspid valve: There was mild regurgitation. - Pulmonary arteries: PA peak pressure: 36 mm Hg (S). - Inferior vena cava: The vessel was dilated. The respirophasic diameter changes were blunted (< 50%), consistent with elevated central venous pressure.  Impressions:  -  Compared to 2012, echo looks similar. The IVC is dilated to 2.5 cm. There is only mild TR with mild pulmonary hypertension at 36 mmHg. An small atrial septal defect cannot be excluded.  Her electrocardiogram shows no ischemic changes.  Her troponins are normal. Lab work shows mild anemia and her potassium on admission was low at 3.3.  One week earlier her potassium was 2.7.  Inpatient Medications  . anastrozole  1 mg Oral QHS  . buPROPion  150 mg Oral Daily  . docusate sodium  200 mg Oral QHS  . fluvoxaMINE  50 mg Oral q morning - 10a    And  . fluvoxaMINE  100 mg Oral QPM  . furosemide  20 mg Oral Daily  . LORazepam  1 mg Intravenous Once  . OxyCODONE  40 mg Oral 3 times per day  . rOPINIRole  0.5 mg Oral QHS  . sodium chloride  3 mL Intravenous Q12H    Family History Family History  Problem Relation Age of Onset  . Diabetes Mother   . Hypertension Mother   . Cancer Mother     leukemia  . Sudden death Mother   . Anesthesia problems Neg Hx   . Hypotension Neg Hx   . Malignant hyperthermia Neg Hx   . Pseudochol deficiency Neg Hx      Social History History   Social History  . Marital Status: Divorced    Spouse Name: N/A  . Number of Children: N/A  . Years of Education: N/A   Occupational History  . Not on file.   Social History Main Topics  . Smoking status: Former Smoker -- 1.00 packs/day    Types: Cigarettes  . Smokeless tobacco: Former Systems developer    Quit date: 04/22/2010  . Alcohol Use: No  . Drug Use: No  . Sexual Activity: Not Currently   Other Topics Concern  . Not on file   Social History Narrative     Review of Systems  General:  No chills, fever, night sweats or weight changes.  Cardiovascular:  No chest pain, dyspnea on exertion, edema, orthopnea, palpitations, paroxysmal nocturnal dyspnea. Dermatological: No rash, lesions/masses Respiratory: No cough, dyspnea Urologic: No hematuria, dysuria Abdominal:   No nausea, vomiting, diarrhea, bright red blood per rectum, melena, or hematemesis Neurologic:  No visual changes, wkns, changes in mental status. All other systems reviewed and are otherwise negative except as noted above.  Physical Exam  Blood pressure 157/72, pulse 69, temperature 98 F (36.7 C), temperature source Oral, resp. rate 18, height 5\' 3"  (1.6 m), weight 131 lb 6.3 oz (59.6 kg), SpO2 98 %.  General: Pleasant, NAD.  Multiple bruises on face Psych: Normal affect. Neuro: Alert and oriented X 3. Moves all extremities spontaneously. HEENT: Normal  Neck: Supple without  bruits or JVD. Lungs:  Resp regular and unlabored, CTA. Heart: RRR no s3, s4, or murmurs. Abdomen: Soft, non-tender, non-distended, BS + x 4.  Extremities: No clubbing, cyanosis or edema. DP/PT/Radials 2+ and equal bilaterally.  Labs  No results for input(s): CKTOTAL, CKMB, TROPONINI in the last 72 hours. Lab Results  Component Value Date   WBC 4.2 08/22/2014   HGB 11.1* 08/22/2014   HCT 33.6* 08/22/2014   MCV 90.3 08/22/2014   PLT 213 08/22/2014    Recent Labs Lab 08/17/14 1815  08/23/14 0912  NA 140  < > 141  K 2.7*  < > 3.7  CL 101  < > 107  CO2 31  < >  27  BUN 24*  < > 16  CREATININE 1.35*  < > 1.20*  CALCIUM 9.4  < > 9.3  PROT 6.3  --   --   BILITOT 0.3  --   --   ALKPHOS 89  --   --   ALT 32  --   --   AST 35  --   --   GLUCOSE 113*  < > 172*  < > = values in this interval not displayed. No results found for: CHOL, HDL, LDLCALC, TRIG No results found for: DDIMER  Radiology/Studies  Dg Chest 2 View  08/21/2014   CLINICAL DATA:  Multiple recent falls  EXAM: CHEST  2 VIEW  COMPARISON:  03/24/2012  FINDINGS: Multiple old healed right rib fracture deformities are again evident. The lungs are clear. The pulmonary vasculature is normal. There are no pleural effusions. Cardiac, mediastinal and hilar contours are unremarkable and unchanged. There is no effusion.  IMPRESSION: No active cardiopulmonary disease.   Electronically Signed   By: Andreas Newport M.D.   On: 08/21/2014 21:47   Dg Thoracic Spine 2 View  08/21/2014   CLINICAL DATA:  Multiple falls recently, with mid back pain.  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 There is no evidence of acute thoracic vertebral fracture. The upper portions of the lumbar fixation hardware are visible and appear intact. There are severe degenerative disc changes in the mid to lower thoracic spine with degenerative -appearing retrolisthesis at multiple  levels. This appears unchanged from the CT of 08/17/2014. No bone lesion or bony destruction is evident.  IMPRESSION: Kyphoscoliosis and severe degenerative disc changes. No evidence of acute fracture.   Electronically Signed   By: Andreas Newport M.D.   On: 08/21/2014 21:46   Dg Lumbar Spine Complete  08/21/2014   CLINICAL DATA:  Multiple falls. Most recent yesterday. Low back pain.  EXAM: LUMBAR SPINE - COMPLETE 4+ VIEW  COMPARISON:  CT of 05/03/2011.  FINDINGS: Convex left lumbar spine curvature. Advanced atherosclerosis involving the aorta. Status post trans pedicle screw fixation at L2-5. No acute hardware complication. Advanced lower thoracic and upper lumbar spondylosis. Loss of intervertebral disc height. Maintenance of vertebral body height.  IMPRESSION: Spondylosis and surgical changes.  No acute osseous abnormality.   Electronically Signed   By: Abigail Miyamoto M.D.   On: 08/21/2014 21:48   Dg Pelvis 1-2 Views  08/21/2014   CLINICAL DATA:  Multiple falls recently  EXAM: PELVIS - 1-2 VIEW  COMPARISON:  01/01/2012  FINDINGS: There is no evidence of pelvic fracture or diastasis. No pelvic bone lesions are seen.  IMPRESSION: Negative.   Electronically Signed   By: Andreas Newport M.D.   On: 08/21/2014 21:48   Dg Shoulder Right  08/21/2014   CLINICAL DATA:  Multiple fall since 07/29/2014. Right shoulder pain.  EXAM: RIGHT SHOULDER - 2+ VIEW  COMPARISON:  08/17/2014 humerus films.  FINDINGS: Right shoulder arthroplasty. Visualized portion of the right hemithorax is normal. Surgical clips which project over the right proximal humerus or axilla. The humeral head component of the arthroplasty projects anterior to the central glenoid on the scapular view. No evidence of dislocation on the first image.  IMPRESSION: Right shoulder arthroplasty. Apparent anterior position of the humeral component relative to the glenoid on the scapular view. This could be projectional. If there is a concern of dislocation,  consider further radiographic evaluation.   Electronically Signed  By: Abigail Miyamoto M.D.   On: 08/21/2014 21:44   Dg Tibia/fibula Left  08/17/2014   CLINICAL DATA:  Left lower leg pain after multiple recent falls.  EXAM: LEFT TIBIA AND FIBULA - 2 VIEW  COMPARISON:  None.  FINDINGS: Cortical margins of the tibia and fibula are intact. There is no acute fracture. Mild soft tissue edema is noted of the lower leg.  IMPRESSION: No fracture of the left lower leg.  Mild soft tissue edema.   Electronically Signed   By: Jeb Levering M.D.   On: 08/17/2014 22:21   Dg Tibia/fibula Right  08/17/2014   CLINICAL DATA:  Right leg pain after multiple recent falls.  EXAM: RIGHT TIBIA AND FIBULA - 2 VIEW  COMPARISON:  None.  FINDINGS: Cortical margins of the right tibia and fibula are intact. There is no fracture. Knee and ankle alignment is maintained. Mild diffuse soft tissue edema.  IMPRESSION: No fracture or subluxation of the right tibia/fibula.   Electronically Signed   By: Jeb Levering M.D.   On: 08/17/2014 19:49   Dg Ankle Complete Left  08/17/2014   CLINICAL DATA:  Left ankle pain after multiple recent falls.  EXAM: LEFT ANKLE COMPLETE - 3+ VIEW  COMPARISON:  None.  FINDINGS: No fracture or dislocation. The alignment and joint spaces are maintained. Ankle mortise is preserved. No focal soft tissue abnormality. Small Achilles tendon enthesophyte.  IMPRESSION: No fracture or dislocation of the left ankle.   Electronically Signed   By: Jeb Levering M.D.   On: 08/17/2014 22:20   Dg Ankle Complete Right  08/17/2014   CLINICAL DATA:  Right ankle pain after multiple recent falls.  EXAM: RIGHT ANKLE - COMPLETE 3+ VIEW  COMPARISON:  None.  FINDINGS: No fracture or dislocation. The alignment and joint spaces are maintained. The ankle mortise is preserved. Osteopenia is noted. No focal soft tissue abnormality.  IMPRESSION: No fracture or dislocation of the right ankle.   Electronically Signed   By: Jeb Levering M.D.   On: 08/17/2014 19:50   Ct Head Wo Contrast  08/22/2014   CLINICAL DATA:  Followup for subdural hematoma. Multiple recent falls.  EXAM: CT HEAD WITHOUT CONTRAST  TECHNIQUE: Contiguous axial images were obtained from the base of the skull through the vertex without intravenous contrast.  COMPARISON:  08/21/2014  FINDINGS: Minimal subdural hemorrhage along the left lateral frontal lobe is unchanged. There is no new intracranial hemorrhage.  Ventricles are normal configuration. Ventricular sulcal enlargement is noted reflecting Mild to moderate stable atrophy. Old lacune infarct in the right caudate nucleus head. Patchy areas of white matter hypoattenuation are noted consistent with moderate chronic microvascular ischemic change. No evidence of a recent cortical infarct.  Mild right periorbital soft tissue hematoma/ swelling.  Visualized sinuses and mastoid air cells are clear. No skull fracture.  IMPRESSION: 1. No change from the previous day's study. 2. Minimal left-sided subdural hemorrhage is stable. No new intracranial hemorrhage.   Electronically Signed   By: Lajean Manes M.D.   On: 08/22/2014 09:35   Ct Head Wo Contrast  08/21/2014   CLINICAL DATA:  Syncope and panic attacks.  Recent falls.  EXAM: CT HEAD WITHOUT CONTRAST  CT MAXILLOFACIAL WITHOUT CONTRAST  CT CERVICAL SPINE WITHOUT CONTRAST  TECHNIQUE: Multidetector CT imaging of the head, cervical spine, and maxillofacial structures were performed using the standard protocol without intravenous contrast. Multiplanar CT image reconstructions of the cervical spine and maxillofacial structures were also generated.  COMPARISON:  08/17/2014  FINDINGS: CT HEAD FINDINGS  There is a left frontotemporal subdural hematoma measuring about 4 mm in maximum thickness. There is no significant mass effect. There is no midline shift. The basal cisterns remain patent. Moderate generalized atrophy is present, unchanged. There is white matter hypodensity  consistent with chronic microvascular ischemic disease. There is an old lacunar infarction in the right caudate. No calvarial or skullbase fracture is evident.  CT MAXILLOFACIAL FINDINGS  No facial or periorbital fracture is evident. Orbital floors are intact. Zygomatic arches and pterygoid plates are intact. There is right frontal scalp hematoma.  CT CERVICAL SPINE FINDINGS  Portions of the lower cervical spine are excluded from the sagittal reconstructions posteriorly, but are seen to good advantage on the axial and coronal images. There is no evidence of cervical spine fracture. Cervical vertebrae are normal in height. The vertebral column, pedicles and facet articulations are intact with no evidence of acute fracture. No acute soft tissue abnormality is evident. Moderately severe facet arthritis is present at multiple levels.  IMPRESSION: 1. Left frontotemporal subdural hematoma, 4 mm. No mass effect or midline shift. 2. Chronic atrophy and microvascular ischemic disease. 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 3. Negative for acute maxillofacial fracture. 4. Negative for acute cervical spine fracture.  These results were called by telephone at the time of interpretation on 08/21/2014 at 10:29 pm to Dr. Waynetta Pean , who verbally acknowledged these results.   Electronically Signed   By: Andreas Newport M.D.   On: 08/21/2014 22:33   Ct Head Wo Contrast  08/17/2014   CLINICAL DATA:  Confusion, multiple falls over the last 3 weeks. Dizziness.  EXAM: CT HEAD WITHOUT CONTRAST  TECHNIQUE: Contiguous axial images were obtained from the base of the skull through the vertex without intravenous contrast.  COMPARISON:  03/31/2011  FINDINGS: No intracranial hemorrhage, mass effect, or midline shift. No hydrocephalus. The basilar cisterns are patent. No evidence of territorial infarct. No intracranial fluid collection. Generalized atrophy and chronic small vessel ischemic change. Small lacunar infarct in the right  caudate and to a lesser extent left basal ganglia. Calvarium is intact. The mastoid air cells are well aerated.  IMPRESSION: 1.  No acute intracranial abnormality. 2. Chronic atrophy and small vessel ischemic change.   Electronically Signed   By: Jeb Levering M.D.   On: 08/17/2014 20:35   Ct Chest W Contrast  08/17/2014   CLINICAL DATA:  69 year old female - continued falls over the last 3 weeks with chest, abdominal and pelvic pain. Initial encounter. History of breast and colon cancer.  EXAM: CT CHEST, ABDOMEN, AND PELVIS WITH CONTRAST  TECHNIQUE: Multidetector CT imaging of the chest, abdomen and pelvis was performed following the standard protocol during bolus administration of intravenous contrast.  CONTRAST:  49mL OMNIPAQUE IOHEXOL 300 MG/ML  SOLN  COMPARISON:  05/03/2011 and prior CTs.  FINDINGS: CT CHEST FINDINGS  Mediastinum/Nodes: The heart and great vessels are unremarkable except for aortic and coronary artery calcifications. There is no evidence of pleural or pericardial effusion. No mediastinal hematoma or pneumothorax identified. No enlarged lymph nodes are noted.  Lungs/Pleura: Lungs are clear. There is no evidence of airspace disease, consolidation, suspicious nodule or mass, or endobronchial/ endotracheal lesion.  Musculoskeletal: No acute or suspicious abnormalities are identified. Multilevel degenerative disc disease within the thoracic spine identified.  CT ABDOMEN AND PELVIS FINDINGS  Hepatobiliary: The liver and gallbladder are unremarkable. There is no evidence of biliary dilatation.  Pancreas: Unremarkable  Spleen: Unremarkable  Adrenals/Urinary Tract: Moderate bilateral renal cortical atrophy identified. The kidneys are otherwise unremarkable. The adrenal glands and bladder are unremarkable.  Stomach/Bowel: Colonic surgical changes identified. There is no evidence of bowel obstruction or definite bowel wall thickening.  Vascular/Lymphatic: No enlarged lymph nodes or abdominal aortic  aneurysm identified.  Reproductive: Patient is status post hysterectomy. No adnexal masses identified.  Other: No free fluid or abscess.  Musculoskeletal: No acute or suspicious abnormalities. Fusion changes throughout the lumbar spine are identified.  IMPRESSION: No evidence of acute abnormality.  Moderate bilateral renal cortical atrophy.  Coronary artery disease.   Electronically Signed   By: Margarette Canada M.D.   On: 08/17/2014 20:55   Ct Cervical Spine Wo Contrast  08/21/2014   CLINICAL DATA:  Syncope and panic attacks.  Recent falls.  EXAM: CT HEAD WITHOUT CONTRAST  CT MAXILLOFACIAL WITHOUT CONTRAST  CT CERVICAL SPINE WITHOUT CONTRAST  TECHNIQUE: Multidetector CT imaging of the head, cervical spine, and maxillofacial structures were performed using the standard protocol without intravenous contrast. Multiplanar CT image reconstructions of the cervical spine and maxillofacial structures were also generated.  COMPARISON:  08/17/2014  FINDINGS: CT HEAD FINDINGS  There is a left frontotemporal subdural hematoma measuring about 4 mm in maximum thickness. There is no significant mass effect. There is no midline shift. The basal cisterns remain patent. Moderate generalized atrophy is present, unchanged. There is white matter hypodensity consistent with chronic microvascular ischemic disease. There is an old lacunar infarction in the right caudate. No calvarial or skullbase fracture is evident.  CT MAXILLOFACIAL FINDINGS  No facial or periorbital fracture is evident. Orbital floors are intact. Zygomatic arches and pterygoid plates are intact. There is right frontal scalp hematoma.  CT CERVICAL SPINE FINDINGS  Portions of the lower cervical spine are excluded from the sagittal reconstructions posteriorly, but are seen to good advantage on the axial and coronal images. There is no evidence of cervical spine fracture. Cervical vertebrae are normal in height. The vertebral column, pedicles and facet articulations are  intact with no evidence of acute fracture. No acute soft tissue abnormality is evident. Moderately severe facet arthritis is present at multiple levels.  IMPRESSION: 1. Left frontotemporal subdural hematoma, 4 mm. No mass effect or midline shift. 2. Chronic atrophy and microvascular ischemic disease. 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 3. Negative for acute maxillofacial fracture. 4. Negative for acute cervical spine fracture.  These results were called by telephone at the time of interpretation on 08/21/2014 at 10:29 pm to Dr. Waynetta Pean , who verbally acknowledged these results.   Electronically Signed   By: Andreas Newport M.D.   On: 08/21/2014 22:33   Ct Cervical Spine Wo Contrast  08/17/2014   CLINICAL DATA:  Initial encounter for falling it confusion for 3 weeks. Most recent fall was today.  EXAM: CT CERVICAL SPINE WITHOUT CONTRAST  TECHNIQUE: Multidetector CT imaging of the cervical spine was performed without intravenous contrast. Multiplanar CT image reconstructions were also generated.  COMPARISON:  C-spine x-ray from 07/18/2005  FINDINGS: Imaging was obtained from the skullbase through the C7-T1 interspace. No evidence for cervical spine fracture. Intervertebral disc spaces are largely preserved throughout. There is diffuse facet degeneration bilaterally. No prevertebral soft tissue swelling.  IMPRESSION: No evidence for cervical spine fracture.   Electronically Signed   By: Misty Stanley M.D.   On: 08/17/2014 20:37   Ct Abdomen Pelvis W Contrast  08/17/2014   CLINICAL DATA:  69 year old female - continued falls over the last  3 weeks with chest, abdominal and pelvic pain. Initial encounter. History of breast and colon cancer.  EXAM: CT CHEST, ABDOMEN, AND PELVIS WITH CONTRAST  TECHNIQUE: Multidetector CT imaging of the chest, abdomen and pelvis was performed following the standard protocol during bolus administration of intravenous contrast.  CONTRAST:  66mL OMNIPAQUE IOHEXOL 300 MG/ML   SOLN  COMPARISON:  05/03/2011 and prior CTs.  FINDINGS: CT CHEST FINDINGS  Mediastinum/Nodes: The heart and great vessels are unremarkable except for aortic and coronary artery calcifications. There is no evidence of pleural or pericardial effusion. No mediastinal hematoma or pneumothorax identified. No enlarged lymph nodes are noted.  Lungs/Pleura: Lungs are clear. There is no evidence of airspace disease, consolidation, suspicious nodule or mass, or endobronchial/ endotracheal lesion.  Musculoskeletal: No acute or suspicious abnormalities are identified. Multilevel degenerative disc disease within the thoracic spine identified.  CT ABDOMEN AND PELVIS FINDINGS  Hepatobiliary: The liver and gallbladder are unremarkable. There is no evidence of biliary dilatation.  Pancreas: Unremarkable  Spleen: Unremarkable  Adrenals/Urinary Tract: Moderate bilateral renal cortical atrophy identified. The kidneys are otherwise unremarkable. The adrenal glands and bladder are unremarkable.  Stomach/Bowel: Colonic surgical changes identified. There is no evidence of bowel obstruction or definite bowel wall thickening.  Vascular/Lymphatic: No enlarged lymph nodes or abdominal aortic aneurysm identified.  Reproductive: Patient is status post hysterectomy. No adnexal masses identified.  Other: No free fluid or abscess.  Musculoskeletal: No acute or suspicious abnormalities. Fusion changes throughout the lumbar spine are identified.  IMPRESSION: No evidence of acute abnormality.  Moderate bilateral renal cortical atrophy.  Coronary artery disease.   Electronically Signed   By: Margarette Canada M.D.   On: 08/17/2014 20:55   Dg Knee Complete 4 Views Left  08/21/2014   CLINICAL DATA:  Multiple falls since 07/29/2014.  EXAM: LEFT KNEE - COMPLETE 4+ VIEW  COMPARISON:  08/17/2014  FINDINGS: Negative for acute fracture, dislocation or radiopaque foreign body. There is chondrocalcinosis. No bone lesion or bony destruction is evident.  IMPRESSION:  Negative for acute fracture.   Electronically Signed   By: Andreas Newport M.D.   On: 08/21/2014 21:43   Dg Knee Complete 4 Views Left  08/17/2014   CLINICAL DATA:  Left knee pain after multiple recent falls.  EXAM: LEFT KNEE - COMPLETE 4+ VIEW  COMPARISON:  None.  FINDINGS: No fracture or dislocation. The alignment and joint spaces are maintained. Trace osteoarthritis with peripheral osteophytes. No joint effusion. Vascular calcifications are seen.  IMPRESSION: No fracture or dislocation of the left knee.   Electronically Signed   By: Jeb Levering M.D.   On: 08/17/2014 22:21   Dg Knee Complete 4 Views Right  08/17/2014   CLINICAL DATA:  Right knee pain after multiple falls.  EXAM: RIGHT KNEE - COMPLETE 4+ VIEW  COMPARISON:  None.  FINDINGS: No fracture or dislocation. The alignment and joint spaces are maintained. Small tricompartmental peripheral osteophytes. Small joint effusion. Vascular calcifications are seen.  IMPRESSION: No fracture or dislocation of the right knee.   Electronically Signed   By: Jeb Levering M.D.   On: 08/17/2014 19:47   Dg Humerus Left  08/17/2014   CLINICAL DATA:  Proximal left humeral pain after recent multiple falls.  EXAM: LEFT HUMERUS - 2+ VIEW  COMPARISON:  None.  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 Distal humerus is intact. No focal soft tissue abnormality.  IMPRESSION: Intact left proximal humerus arthroplasty, no fracture.   Electronically Signed  By: Jeb Levering M.D.   On: 08/17/2014 19:57   Dg Humerus Right  08/17/2014   CLINICAL DATA:  Initial encounter for right humerus pain after fall today.  EXAM: RIGHT HUMERUS - 2+ VIEW  COMPARISON:  None.  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 No evidence for periprosthetic fracture. No lucency around the humeral prosthesis.  IMPRESSION: Negative.   Electronically Signed   By: Misty Stanley  M.D.   On: 08/17/2014 22:21   Dg Foot Complete Left  08/17/2014   CLINICAL DATA:  Fall, 3 weeks ago with acute left foot pain and bruising laterally.  EXAM: LEFT FOOT - COMPLETE 3+ VIEW  COMPARISON:  None.  FINDINGS: Mild diffuse degenerative changes of the DIP and PIP joints diffusely. Normal alignment without acute fracture. No soft tissue abnormality or radiopaque foreign body. No focal swelling.  IMPRESSION: Mild degenerative changes.  No acute osseous finding   Electronically Signed   By: Jerilynn Mages.  Shick M.D.   On: 08/17/2014 22:21   Dg Foot Complete Right  08/17/2014   CLINICAL DATA:  Right foot pain after multiple recent falls.  EXAM: RIGHT FOOT COMPLETE - 3+ VIEW  COMPARISON:  None.  FINDINGS: Transverse sclerosis involving the great toe proximal phalanx, may reflect nondisplaced fracture. There is otherwise no fracture. The alignment and joint spaces are maintained. Hammertoe deformity noted of the digits. There is an os peroneal. Plantar calcaneal spur and small Achilles tendon enthesophyte.  IMPRESSION: Question nondisplaced fracture of the great toe proximal phalanx, acuity uncertain. Correlation with point tenderness recommended. Otherwise no right foot fracture.   Electronically Signed   By: Jeb Levering M.D.   On: 08/17/2014 19:52   Dg Hip Unilat With Pelvis 2-3 Views Right  08/17/2014   CLINICAL DATA:  Right hip pain after multiple falls.  EXAM: RIGHT HIP (WITH PELVIS) 2-3 VIEWS  COMPARISON:  None.  FINDINGS: The cortical margins of the bony pelvis are intact. No fracture. Pubic symphysis and sacroiliac joints are congruent. Both femoral heads are well-seated in the respective acetabula. Mild osteoarthritis of both hips, pubic symphysis, and sacroiliac joints. Vascular calcifications are seen. Postsurgical change noted in the included lower lumbar spine.  IMPRESSION: No fracture or dislocation of the pelvis or right hip.   Electronically Signed   By: Jeb Levering M.D.   On: 08/17/2014  19:47   Dg Femur Min 2 Views Left  08/21/2014   CLINICAL DATA:  Multiple falls, left hip pain  EXAM: LEFT FEMUR 2 VIEWS  COMPARISON:  None.  FINDINGS: No fracture or dislocation is seen.  Degenerative changes of the left hip and knee.  Visualized bony pelvis appears intact.  Vascular calcifications.  IMPRESSION: No fracture or dislocation is seen.   Electronically Signed   By: Julian Hy M.D.   On: 08/21/2014 21:45   Dg Femur Min 2 Views Left  08/17/2014   CLINICAL DATA:  Left leg pain after multiple recent falls.  EXAM: LEFT FEMUR 2 VIEWS  COMPARISON:  None.  FINDINGS: The cortical margins of the left femur are intact. There is no fracture. Hip and knee alignment is maintained. Vascular calcifications are seen.  IMPRESSION: No fracture of the left femur.   Electronically Signed   By: Jeb Levering M.D.   On: 08/17/2014 22:22   Ct Maxillofacial Wo Cm  08/21/2014   CLINICAL DATA:  Syncope and panic attacks.  Recent falls.  EXAM: CT HEAD WITHOUT  CONTRAST  CT MAXILLOFACIAL WITHOUT CONTRAST  CT CERVICAL SPINE WITHOUT CONTRAST  TECHNIQUE: Multidetector CT imaging of the head, cervical spine, and maxillofacial structures were performed using the standard protocol without intravenous contrast. Multiplanar CT image reconstructions of the cervical spine and maxillofacial structures were also generated.  COMPARISON:  08/17/2014  FINDINGS: CT HEAD FINDINGS  There is a left frontotemporal subdural hematoma measuring about 4 mm in maximum thickness. There is no significant mass effect. There is no midline shift. The basal cisterns remain patent. Moderate generalized atrophy is present, unchanged. There is white matter hypodensity consistent with chronic microvascular ischemic disease. There is an old lacunar infarction in the right caudate. No calvarial or skullbase fracture is evident.  CT MAXILLOFACIAL FINDINGS  No facial or periorbital fracture is evident. Orbital floors are intact. Zygomatic arches and  pterygoid plates are intact. There is right frontal scalp hematoma.  CT CERVICAL SPINE FINDINGS  Portions of the lower cervical spine are excluded from the sagittal reconstructions posteriorly, but are seen to good advantage on the axial and coronal images. There is no evidence of cervical spine fracture. Cervical vertebrae are normal in height. The vertebral column, pedicles and facet articulations are intact with no evidence of acute fracture. No acute soft tissue abnormality is evident. Moderately severe facet arthritis is present at multiple levels.  IMPRESSION: 1. Left frontotemporal subdural hematoma, 4 mm. No mass effect or midline shift. 2. Chronic atrophy and microvascular ischemic disease. 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 3. Negative for acute maxillofacial fracture. 4. Negative for acute cervical spine fracture.  These results were called by telephone at the time of interpretation on 08/21/2014 at 10:29 pm to Dr. Waynetta Pean , who verbally acknowledged these results.   Electronically Signed   By: Andreas Newport M.D.   On: 08/21/2014 22:33   Ct Maxillofacial Wo Cm  08/17/2014   CLINICAL DATA:  Multiple falls over the last 3 weeks, bruising over both eyes. Dizziness.  EXAM: CT MAXILLOFACIAL WITHOUT CONTRAST  TECHNIQUE: Multidetector CT imaging of the maxillofacial structures was performed. Multiplanar CT image reconstructions were also generated. A small metallic BB was placed on the right temple in order to reliably differentiate right from left.  COMPARISON:  None.  FINDINGS: No facial bone fracture. The orbits and globes are intact. Zygomatic arches, pterygoid plates, and mandibles are intact. The paranasal sinuses are well-aerated. There is right supraorbital soft tissue hematoma and soft tissue edema.  IMPRESSION: No facial bone fracture.  Right supraorbital soft tissue hematoma.   Electronically Signed   By: Jeb Levering M.D.   On: 08/17/2014 20:37    ECG  21-Aug-2014  17:45:39 [facility]/ED ROUTINE RECORD Normal sinus rhythm Normal ECG No significant change was found Confirmed by Wyvonnia Dusky MD, STEPHEN [phone]) on 08/21/2014 8:26:36 PM Personally reviewed.  Telemetry was reviewed.  She has maintained normal sinus rhythm.  she has occasional premature atrial beats.  There has been a lot of artifact which the computer has misinterpreted as PVCs.  During this admission she has not had any significant arrhythmias which would cause sudden dizziness and syncope. ASSESSMENT AND PLAN 1.  Syncope of uncertain etiology and history of recurrent falls.  Her blood pressure during this admission has not shown any evidence of orthostatic hypotension.  Her telemetry has not shown any significant arrhythmias.  At this point the etiology is unclear.  Her echocardiogram shows normal systolic function and no significant valve abnormalities. 2.  Chronic pain syndrome with chronic  low back pain and prior back surgery, followed by Dr. Nicholaus Bloom. 3.  Hypokalemia on admission and leading up to admission.  This could also have contributed to her recurrent falls.  She states that she cannot do without her Lasix because she develops fluid retention and peripheral edema.  She may stay on her current dose of Lasix and she will need continued aggressive potassium repletion as an outpatient. Recommendation: No further inpatient cardiology evaluation necessary.  After discharge she should have a 30 day event monitor.  This can be arranged through  [place] office as an outpatient.  Signed, Darlin Coco, MD  08/24/2014, 10:34 AM

## 2014-08-24 NOTE — Discharge Summary (Signed)
Physician Discharge Summary  [name] [id number] DOB: [birth year] DOA: 08/21/2014  PCP: No PCP Per Patient  Admit date: 08/21/2014 Discharge date: 08/24/2014  Time spent: 30 minutes  Recommendations for Outpatient Follow-up:  1. Follow u pwith cardiology tomorrow for event monitor placement for 30 days.  2. Please follow up with PCP in one week.  3. Follow up with neuro surgery in one week.   Discharge Diagnoses:  Principal Problem:   Subdural hematoma Active Problems:   Hypokalemia   HTN (hypertension)   Cancer of colon   H/O Breast cancer status post lumpectomy, radiation therapy   Frequent falls   Facial contusion   Discharge Condition: improved  Diet recommendation: low sodium diet  Filed Weights   08/21/14 1706 08/22/14 0048  Weight: 62.596 kg (138 lb) 59.6 kg (131 lb 6.3 oz)    History of present illness:  69 year old lady admitted for syncope, of unclear etiology. Cardiology consutled and recommendations given.   Hospital Course:  1. Syncope: - unclear etiology. Echo is not significant. orthostatic vital signs are negative. Telemetry monitoring showed sinus arrhythmias with PVC'S. Her two troponins are negative.  requested cardiology consult for further evaluation. Recommended outpatient follow up for an event monitor placement.    2. Small SDH: Stable on repeat CT head. Neuro surgery consulted and recommendations given. Follow up with neurol surgery as outpatient.    3. Mild anemia: Stable.    Hypokalemia: replete as needed.    Mild stage 1 CKD: Improved with hydration.   Accelerated hypertension: Improved.  resume home meds.    Procedures:  Echocardiogram.   Consultations: Neuro surgery cardiology  Discharge Exam: Filed Vitals:   08/24/14 1000  BP: 157/72  Pulse: 69  Temp: 98 F (36.7 C)  Resp: 18    General: alert afebrile comfortable Cardiovascular: s1s2 Respiratory: ctab  Discharge Instructions   Discharge  Instructions    Diet - low sodium heart healthy    Complete by:  As directed      Diet - low sodium heart healthy    Complete by:  As directed      Discharge instructions    Complete by:  As directed   Follow up with neuro surgery as recommended ino ne week.     Discharge instructions    Complete by:  As directed   Follow up with cardiology for event  Monitor placement for 30 days.  Follow up with your PCP in one week.          Current Discharge Medication List    CONTINUE these medications which have CHANGED   Details  OxyCODONE (OXYCONTIN) 40 mg T12A 12 hr tablet Take 1 tablet (40 mg total) by mouth every 8 (eight) hours.      CONTINUE these medications which have NOT CHANGED   Details  ALPRAZolam (XANAX) 0.5 MG tablet Take 0.5 mg by mouth 3 (three) times daily as needed for anxiety.     anastrozole (ARIMIDEX) 1 MG tablet Take 1 tablet (1 mg total) by mouth at bedtime. Qty: 90 tablet, Refills: 3   Associated Diagnoses: Breast cancer, unspecified laterality    buPROPion (WELLBUTRIN XL) 150 MG 24 hr tablet Take 150 mg by mouth daily.    docusate sodium (COLACE) 100 MG capsule Take 200 mg by mouth at bedtime.    fluvoxaMINE (LUVOX) 50 MG tablet Take 50-100 mg by mouth 2 (two) times daily. She takes one tablet in the morning and two tablets in the  evening.    furosemide (LASIX) 20 MG tablet Take 20 mg by mouth daily.    Potassium Gluconate 595 MG CAPS Take 595 mg by mouth 2 (two) times daily.     rOPINIRole (REQUIP) 0.5 MG tablet Take 0.5 mg by mouth at bedtime.      STOP taking these medications     naproxen sodium (ANAPROX) 220 MG tablet      potassium chloride (K-DUR) 10 MEQ tablet        No Known Allergies Follow-up Information    Follow up with [facility].   Specialty:  Cardiology   Why:  FOR event monitor placement.    Contact information:   [address], [place] [phone]       The  results of significant diagnostics from this hospitalization (including imaging, microbiology, ancillary and laboratory) are listed below for reference.    Significant Diagnostic Studies: Dg Chest 2 View  08/21/2014   CLINICAL DATA:  Multiple recent falls  EXAM: CHEST  2 VIEW  COMPARISON:  03/24/2012  FINDINGS: Multiple old healed right rib fracture deformities are again evident. The lungs are clear. The pulmonary vasculature is normal. There are no pleural effusions. Cardiac, mediastinal and hilar contours are unremarkable and unchanged. There is no effusion.  IMPRESSION: No active cardiopulmonary disease.   Electronically Signed   By: Andreas Newport M.D.   On: 08/21/2014 21:47   Dg Thoracic Spine 2 View  08/21/2014   CLINICAL DATA:  Multiple falls recently, with mid back pain.  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 There is no evidence of acute thoracic vertebral fracture. The upper portions of the lumbar fixation hardware are visible and appear intact. There are severe degenerative disc changes in the mid to lower thoracic spine with degenerative -appearing retrolisthesis at multiple levels. This appears unchanged from the CT of 08/17/2014. No bone lesion or bony destruction is evident.  IMPRESSION: Kyphoscoliosis and severe degenerative disc changes. No evidence of acute fracture.   Electronically Signed   By: Andreas Newport M.D.   On: 08/21/2014 21:46   Dg Lumbar Spine Complete  08/21/2014   CLINICAL DATA:  Multiple falls. Most recent yesterday. Low back pain.  EXAM: LUMBAR SPINE - COMPLETE 4+ VIEW  COMPARISON:  CT of 05/03/2011.  FINDINGS: Convex left lumbar spine curvature. Advanced atherosclerosis involving the aorta. Status post trans pedicle screw fixation at L2-5. No acute hardware complication. Advanced lower thoracic and upper lumbar spondylosis. Loss of intervertebral disc height. Maintenance of vertebral  body height.  IMPRESSION: Spondylosis and surgical changes.  No acute osseous abnormality.   Electronically Signed   By: Abigail Miyamoto M.D.   On: 08/21/2014 21:48   Dg Pelvis 1-2 Views  08/21/2014   CLINICAL DATA:  Multiple falls recently  EXAM: PELVIS - 1-2 VIEW  COMPARISON:  01/01/2012  FINDINGS: There is no evidence of pelvic fracture or diastasis. No pelvic bone lesions are seen.  IMPRESSION: Negative.   Electronically Signed   By: Andreas Newport M.D.   On: 08/21/2014 21:48   Dg Shoulder Right  08/21/2014   CLINICAL DATA:  Multiple fall since 07/29/2014. Right shoulder pain.  EXAM: RIGHT SHOULDER - 2+ VIEW  COMPARISON:  08/17/2014 humerus films.  FINDINGS: Right shoulder arthroplasty. Visualized portion of the right hemithorax is normal. Surgical clips which project over the right proximal humerus or axilla. The humeral  head component of the arthroplasty projects anterior to the central glenoid on the scapular view. No evidence of dislocation on the first image.  IMPRESSION: Right shoulder arthroplasty. Apparent anterior position of the humeral component relative to the glenoid on the scapular view. This could be projectional. If there is a concern of dislocation, consider further radiographic evaluation.   Electronically Signed   By: Abigail Miyamoto M.D.   On: 08/21/2014 21:44   Dg Tibia/fibula Left  08/17/2014   CLINICAL DATA:  Left lower leg pain after multiple recent falls.  EXAM: LEFT TIBIA AND FIBULA - 2 VIEW  COMPARISON:  None.  FINDINGS: Cortical margins of the tibia and fibula are intact. There is no acute fracture. Mild soft tissue edema is noted of the lower leg.  IMPRESSION: No fracture of the left lower leg.  Mild soft tissue edema.   Electronically Signed   By: Jeb Levering M.D.   On: 08/17/2014 22:21   Dg Tibia/fibula Right  08/17/2014   CLINICAL DATA:  Right leg pain after multiple recent falls.  EXAM: RIGHT TIBIA AND FIBULA - 2 VIEW  COMPARISON:  None.  FINDINGS: Cortical margins of  the right tibia and fibula are intact. There is no fracture. Knee and ankle alignment is maintained. Mild diffuse soft tissue edema.  IMPRESSION: No fracture or subluxation of the right tibia/fibula.   Electronically Signed   By: Jeb Levering M.D.   On: 08/17/2014 19:49   Dg Ankle Complete Left  08/17/2014   CLINICAL DATA:  Left ankle pain after multiple recent falls.  EXAM: LEFT ANKLE COMPLETE - 3+ VIEW  COMPARISON:  None.  FINDINGS: No fracture or dislocation. The alignment and joint spaces are maintained. Ankle mortise is preserved. No focal soft tissue abnormality. Small Achilles tendon enthesophyte.  IMPRESSION: No fracture or dislocation of the left ankle.   Electronically Signed   By: Jeb Levering M.D.   On: 08/17/2014 22:20   Dg Ankle Complete Right  08/17/2014   CLINICAL DATA:  Right ankle pain after multiple recent falls.  EXAM: RIGHT ANKLE - COMPLETE 3+ VIEW  COMPARISON:  None.  FINDINGS: No fracture or dislocation. The alignment and joint spaces are maintained. The ankle mortise is preserved. Osteopenia is noted. No focal soft tissue abnormality.  IMPRESSION: No fracture or dislocation of the right ankle.   Electronically Signed   By: Jeb Levering M.D.   On: 08/17/2014 19:50   Ct Head Wo Contrast  08/22/2014   CLINICAL DATA:  Followup for subdural hematoma. Multiple recent falls.  EXAM: CT HEAD WITHOUT CONTRAST  TECHNIQUE: Contiguous axial images were obtained from the base of the skull through the vertex without intravenous contrast.  COMPARISON:  08/21/2014  FINDINGS: Minimal subdural hemorrhage along the left lateral frontal lobe is unchanged. There is no new intracranial hemorrhage.  Ventricles are normal configuration. Ventricular sulcal enlargement is noted reflecting Mild to moderate stable atrophy. Old lacune infarct in the right caudate nucleus head. Patchy areas of white matter hypoattenuation are noted consistent with moderate chronic microvascular ischemic change. No  evidence of a recent cortical infarct.  Mild right periorbital soft tissue hematoma/ swelling.  Visualized sinuses and mastoid air cells are clear. No skull fracture.  IMPRESSION: 1. No change from the previous day's study. 2. Minimal left-sided subdural hemorrhage is stable. No new intracranial hemorrhage.   Electronically Signed   By: Lajean Manes M.D.   On: 08/22/2014 09:35   Ct Head Wo Contrast  08/21/2014  CLINICAL DATA:  Syncope and panic attacks.  Recent falls.  EXAM: CT HEAD WITHOUT CONTRAST  CT MAXILLOFACIAL WITHOUT CONTRAST  CT CERVICAL SPINE WITHOUT CONTRAST  TECHNIQUE: Multidetector CT imaging of the head, cervical spine, and maxillofacial structures were performed using the standard protocol without intravenous contrast. Multiplanar CT image reconstructions of the cervical spine and maxillofacial structures were also generated.  COMPARISON:  08/17/2014  FINDINGS: CT HEAD FINDINGS  There is a left frontotemporal subdural hematoma measuring about 4 mm in maximum thickness. There is no significant mass effect. There is no midline shift. The basal cisterns remain patent. Moderate generalized atrophy is present, unchanged. There is white matter hypodensity consistent with chronic microvascular ischemic disease. There is an old lacunar infarction in the right caudate. No calvarial or skullbase fracture is evident.  CT MAXILLOFACIAL FINDINGS  No facial or periorbital fracture is evident. Orbital floors are intact. Zygomatic arches and pterygoid plates are intact. There is right frontal scalp hematoma.  CT CERVICAL SPINE FINDINGS  Portions of the lower cervical spine are excluded from the sagittal reconstructions posteriorly, but are seen to good advantage on the axial and coronal images. There is no evidence of cervical spine fracture. Cervical vertebrae are normal in height. The vertebral column, pedicles and facet articulations are intact with no evidence of acute fracture. No acute soft tissue  abnormality is evident. Moderately severe facet arthritis is present at multiple levels.  IMPRESSION: 1. Left frontotemporal subdural hematoma, 4 mm. No mass effect or midline shift. 2. Chronic atrophy and microvascular ischemic disease. 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 3. Negative for acute maxillofacial fracture. 4. Negative for acute cervical spine fracture.  These results were called by telephone at the time of interpretation on 08/21/2014 at 10:29 pm to Dr. Waynetta Pean , who verbally acknowledged these results.   Electronically Signed   By: Andreas Newport M.D.   On: 08/21/2014 22:33   Ct Head Wo Contrast  08/17/2014   CLINICAL DATA:  Confusion, multiple falls over the last 3 weeks. Dizziness.  EXAM: CT HEAD WITHOUT CONTRAST  TECHNIQUE: Contiguous axial images were obtained from the base of the skull through the vertex without intravenous contrast.  COMPARISON:  03/31/2011  FINDINGS: No intracranial hemorrhage, mass effect, or midline shift. No hydrocephalus. The basilar cisterns are patent. No evidence of territorial infarct. No intracranial fluid collection. Generalized atrophy and chronic small vessel ischemic change. Small lacunar infarct in the right caudate and to a lesser extent left basal ganglia. Calvarium is intact. The mastoid air cells are well aerated.  IMPRESSION: 1.  No acute intracranial abnormality. 2. Chronic atrophy and small vessel ischemic change.   Electronically Signed   By: Jeb Levering M.D.   On: 08/17/2014 20:35   Ct Chest W Contrast  08/17/2014   CLINICAL DATA:  69 year old female - continued falls over the last 3 weeks with chest, abdominal and pelvic pain. Initial encounter. History of breast and colon cancer.  EXAM: CT CHEST, ABDOMEN, AND PELVIS WITH CONTRAST  TECHNIQUE: Multidetector CT imaging of the chest, abdomen and pelvis was performed following the standard protocol during bolus administration of intravenous contrast.  CONTRAST:  21mL OMNIPAQUE  IOHEXOL 300 MG/ML  SOLN  COMPARISON:  05/03/2011 and prior CTs.  FINDINGS: CT CHEST FINDINGS  Mediastinum/Nodes: The heart and great vessels are unremarkable except for aortic and coronary artery calcifications. There is no evidence of pleural or pericardial effusion. No mediastinal hematoma or pneumothorax identified. No enlarged lymph nodes are noted.  Lungs/Pleura: Lungs are clear. There is no evidence of airspace disease, consolidation, suspicious nodule or mass, or endobronchial/ endotracheal lesion.  Musculoskeletal: No acute or suspicious abnormalities are identified. Multilevel degenerative disc disease within the thoracic spine identified.  CT ABDOMEN AND PELVIS FINDINGS  Hepatobiliary: The liver and gallbladder are unremarkable. There is no evidence of biliary dilatation.  Pancreas: Unremarkable  Spleen: Unremarkable  Adrenals/Urinary Tract: Moderate bilateral renal cortical atrophy identified. The kidneys are otherwise unremarkable. The adrenal glands and bladder are unremarkable.  Stomach/Bowel: Colonic surgical changes identified. There is no evidence of bowel obstruction or definite bowel wall thickening.  Vascular/Lymphatic: No enlarged lymph nodes or abdominal aortic aneurysm identified.  Reproductive: Patient is status post hysterectomy. No adnexal masses identified.  Other: No free fluid or abscess.  Musculoskeletal: No acute or suspicious abnormalities. Fusion changes throughout the lumbar spine are identified.  IMPRESSION: No evidence of acute abnormality.  Moderate bilateral renal cortical atrophy.  Coronary artery disease.   Electronically Signed   By: Margarette Canada M.D.   On: 08/17/2014 20:55   Ct Cervical Spine Wo Contrast  08/21/2014   CLINICAL DATA:  Syncope and panic attacks.  Recent falls.  EXAM: CT HEAD WITHOUT CONTRAST  CT MAXILLOFACIAL WITHOUT CONTRAST  CT CERVICAL SPINE WITHOUT CONTRAST  TECHNIQUE: Multidetector CT imaging of the head, cervical spine, and maxillofacial structures were  performed using the standard protocol without intravenous contrast. Multiplanar CT image reconstructions of the cervical spine and maxillofacial structures were also generated.  COMPARISON:  08/17/2014  FINDINGS: CT HEAD FINDINGS  There is a left frontotemporal subdural hematoma measuring about 4 mm in maximum thickness. There is no significant mass effect. There is no midline shift. The basal cisterns remain patent. Moderate generalized atrophy is present, unchanged. There is white matter hypodensity consistent with chronic microvascular ischemic disease. There is an old lacunar infarction in the right caudate. No calvarial or skullbase fracture is evident.  CT MAXILLOFACIAL FINDINGS  No facial or periorbital fracture is evident. Orbital floors are intact. Zygomatic arches and pterygoid plates are intact. There is right frontal scalp hematoma.  CT CERVICAL SPINE FINDINGS  Portions of the lower cervical spine are excluded from the sagittal reconstructions posteriorly, but are seen to good advantage on the axial and coronal images. There is no evidence of cervical spine fracture. Cervical vertebrae are normal in height. The vertebral column, pedicles and facet articulations are intact with no evidence of acute fracture. No acute soft tissue abnormality is evident. Moderately severe facet arthritis is present at multiple levels.  IMPRESSION: 1. Left frontotemporal subdural hematoma, 4 mm. No mass effect or midline shift. 2. Chronic atrophy and microvascular ischemic disease. 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 3. Negative for acute maxillofacial fracture. 4. Negative for acute cervical spine fracture.  These results were called by telephone at the time of interpretation on 08/21/2014 at 10:29 pm to Dr. Waynetta Pean , who verbally acknowledged these results.   Electronically Signed   By: Andreas Newport M.D.   On: 08/21/2014 22:33   Ct Cervical Spine Wo Contrast  08/17/2014   CLINICAL DATA:  Initial  encounter for falling it confusion for 3 weeks. Most recent fall was today.  EXAM: CT CERVICAL SPINE WITHOUT CONTRAST  TECHNIQUE: Multidetector CT imaging of the cervical spine was performed without intravenous contrast. Multiplanar CT image reconstructions were also generated.  COMPARISON:  C-spine x-ray from 07/18/2005  FINDINGS: Imaging was obtained from the skullbase through the C7-T1 interspace. No evidence for cervical  spine fracture. Intervertebral disc spaces are largely preserved throughout. There is diffuse facet degeneration bilaterally. No prevertebral soft tissue swelling.  IMPRESSION: No evidence for cervical spine fracture.   Electronically Signed   By: Misty Stanley M.D.   On: 08/17/2014 20:37   Ct Abdomen Pelvis W Contrast  08/17/2014   CLINICAL DATA:  69 year old female - continued falls over the last 3 weeks with chest, abdominal and pelvic pain. Initial encounter. History of breast and colon cancer.  EXAM: CT CHEST, ABDOMEN, AND PELVIS WITH CONTRAST  TECHNIQUE: Multidetector CT imaging of the chest, abdomen and pelvis was performed following the standard protocol during bolus administration of intravenous contrast.  CONTRAST:  18mL OMNIPAQUE IOHEXOL 300 MG/ML  SOLN  COMPARISON:  05/03/2011 and prior CTs.  FINDINGS: CT CHEST FINDINGS  Mediastinum/Nodes: The heart and great vessels are unremarkable except for aortic and coronary artery calcifications. There is no evidence of pleural or pericardial effusion. No mediastinal hematoma or pneumothorax identified. No enlarged lymph nodes are noted.  Lungs/Pleura: Lungs are clear. There is no evidence of airspace disease, consolidation, suspicious nodule or mass, or endobronchial/ endotracheal lesion.  Musculoskeletal: No acute or suspicious abnormalities are identified. Multilevel degenerative disc disease within the thoracic spine identified.  CT ABDOMEN AND PELVIS FINDINGS  Hepatobiliary: The liver and gallbladder are unremarkable. There is no  evidence of biliary dilatation.  Pancreas: Unremarkable  Spleen: Unremarkable  Adrenals/Urinary Tract: Moderate bilateral renal cortical atrophy identified. The kidneys are otherwise unremarkable. The adrenal glands and bladder are unremarkable.  Stomach/Bowel: Colonic surgical changes identified. There is no evidence of bowel obstruction or definite bowel wall thickening.  Vascular/Lymphatic: No enlarged lymph nodes or abdominal aortic aneurysm identified.  Reproductive: Patient is status post hysterectomy. No adnexal masses identified.  Other: No free fluid or abscess.  Musculoskeletal: No acute or suspicious abnormalities. Fusion changes throughout the lumbar spine are identified.  IMPRESSION: No evidence of acute abnormality.  Moderate bilateral renal cortical atrophy.  Coronary artery disease.   Electronically Signed   By: Margarette Canada M.D.   On: 08/17/2014 20:55   Dg Knee Complete 4 Views Left  08/21/2014   CLINICAL DATA:  Multiple falls since 07/29/2014.  EXAM: LEFT KNEE - COMPLETE 4+ VIEW  COMPARISON:  08/17/2014  FINDINGS: Negative for acute fracture, dislocation or radiopaque foreign body. There is chondrocalcinosis. No bone lesion or bony destruction is evident.  IMPRESSION: Negative for acute fracture.   Electronically Signed   By: Andreas Newport M.D.   On: 08/21/2014 21:43   Dg Knee Complete 4 Views Left  08/17/2014   CLINICAL DATA:  Left knee pain after multiple recent falls.  EXAM: LEFT KNEE - COMPLETE 4+ VIEW  COMPARISON:  None.  FINDINGS: No fracture or dislocation. The alignment and joint spaces are maintained. Trace osteoarthritis with peripheral osteophytes. No joint effusion. Vascular calcifications are seen.  IMPRESSION: No fracture or dislocation of the left knee.   Electronically Signed   By: Jeb Levering M.D.   On: 08/17/2014 22:21   Dg Knee Complete 4 Views Right  08/17/2014   CLINICAL DATA:  Right knee pain after multiple falls.  EXAM: RIGHT KNEE - COMPLETE 4+ VIEW   COMPARISON:  None.  FINDINGS: No fracture or dislocation. The alignment and joint spaces are maintained. Small tricompartmental peripheral osteophytes. Small joint effusion. Vascular calcifications are seen.  IMPRESSION: No fracture or dislocation of the right knee.   Electronically Signed   By: Jeb Levering M.D.   On: 08/17/2014 19:47  Dg Humerus Left  08/17/2014   CLINICAL DATA:  Proximal left humeral pain after recent multiple falls.  EXAM: LEFT HUMERUS - 2+ VIEW  COMPARISON:  None.  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 Distal humerus is intact. No focal soft tissue abnormality.  IMPRESSION: Intact left proximal humerus arthroplasty, no fracture.   Electronically Signed   By: Jeb Levering M.D.   On: 08/17/2014 19:57   Dg Humerus Right  08/17/2014   CLINICAL DATA:  Initial encounter for right humerus pain after fall today.  EXAM: RIGHT HUMERUS - 2+ VIEW  COMPARISON:  None.  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 No evidence for periprosthetic fracture. No lucency around the humeral prosthesis.  IMPRESSION: Negative.   Electronically Signed   By: Misty Stanley M.D.   On: 08/17/2014 22:21   Dg Foot Complete Left  08/17/2014   CLINICAL DATA:  Fall, 3 weeks ago with acute left foot pain and bruising laterally.  EXAM: LEFT FOOT - COMPLETE 3+ VIEW  COMPARISON:  None.  FINDINGS: Mild diffuse degenerative changes of the DIP and PIP joints diffusely. Normal alignment without acute fracture. No soft tissue abnormality or radiopaque foreign body. No focal swelling.  IMPRESSION: Mild degenerative changes.  No acute osseous finding   Electronically Signed   By: Jerilynn Mages.  Shick M.D.   On: 08/17/2014 22:21   Dg Foot Complete Right  08/17/2014   CLINICAL DATA:  Right foot pain after multiple recent falls.  EXAM: RIGHT FOOT COMPLETE - 3+ VIEW  COMPARISON:  None.  FINDINGS: Transverse sclerosis  involving the great toe proximal phalanx, may reflect nondisplaced fracture. There is otherwise no fracture. The alignment and joint spaces are maintained. Hammertoe deformity noted of the digits. There is an os peroneal. Plantar calcaneal spur and small Achilles tendon enthesophyte.  IMPRESSION: Question nondisplaced fracture of the great toe proximal phalanx, acuity uncertain. Correlation with point tenderness recommended. Otherwise no right foot fracture.   Electronically Signed   By: Jeb Levering M.D.   On: 08/17/2014 19:52   Dg Hip Unilat With Pelvis 2-3 Views Right  08/17/2014   CLINICAL DATA:  Right hip pain after multiple falls.  EXAM: RIGHT HIP (WITH PELVIS) 2-3 VIEWS  COMPARISON:  None.  FINDINGS: The cortical margins of the bony pelvis are intact. No fracture. Pubic symphysis and sacroiliac joints are congruent. Both femoral heads are well-seated in the respective acetabula. Mild osteoarthritis of both hips, pubic symphysis, and sacroiliac joints. Vascular calcifications are seen. Postsurgical change noted in the included lower lumbar spine.  IMPRESSION: No fracture or dislocation of the pelvis or right hip.   Electronically Signed   By: Jeb Levering M.D.   On: 08/17/2014 19:47   Dg Femur Min 2 Views Left  08/21/2014   CLINICAL DATA:  Multiple falls, left hip pain  EXAM: LEFT FEMUR 2 VIEWS  COMPARISON:  None.  FINDINGS: No fracture or dislocation is seen.  Degenerative changes of the left hip and knee.  Visualized bony pelvis appears intact.  Vascular calcifications.  IMPRESSION: No fracture or dislocation is seen.   Electronically Signed   By: Julian Hy M.D.   On: 08/21/2014 21:45   Dg Femur Min 2 Views Left  08/17/2014   CLINICAL DATA:  Left leg pain after multiple recent falls.  EXAM: LEFT FEMUR 2 VIEWS  COMPARISON:  None.  FINDINGS: The cortical margins of the left femur  are intact. There is no fracture. Hip and knee alignment is maintained. Vascular calcifications are seen.   IMPRESSION: No fracture of the left femur.   Electronically Signed   By: Jeb Levering M.D.   On: 08/17/2014 22:22   Ct Maxillofacial Wo Cm  08/21/2014   CLINICAL DATA:  Syncope and panic attacks.  Recent falls.  EXAM: CT HEAD WITHOUT CONTRAST  CT MAXILLOFACIAL WITHOUT CONTRAST  CT CERVICAL SPINE WITHOUT CONTRAST  TECHNIQUE: Multidetector CT imaging of the head, cervical spine, and maxillofacial structures were performed using the standard protocol without intravenous contrast. Multiplanar CT image reconstructions of the cervical spine and maxillofacial structures were also generated.  COMPARISON:  08/17/2014  FINDINGS: CT HEAD FINDINGS  There is a left frontotemporal subdural hematoma measuring about 4 mm in maximum thickness. There is no significant mass effect. There is no midline shift. The basal cisterns remain patent. Moderate generalized atrophy is present, unchanged. There is white matter hypodensity consistent with chronic microvascular ischemic disease. There is an old lacunar infarction in the right caudate. No calvarial or skullbase fracture is evident.  CT MAXILLOFACIAL FINDINGS  No facial or periorbital fracture is evident. Orbital floors are intact. Zygomatic arches and pterygoid plates are intact. There is right frontal scalp hematoma.  CT CERVICAL SPINE FINDINGS  Portions of the lower cervical spine are excluded from the sagittal reconstructions posteriorly, but are seen to good advantage on the axial and coronal images. There is no evidence of cervical spine fracture. Cervical vertebrae are normal in height. The vertebral column, pedicles and facet articulations are intact with no evidence of acute fracture. No acute soft tissue abnormality is evident. Moderately severe facet arthritis is present at multiple levels.  IMPRESSION: 1. Left frontotemporal subdural hematoma, 4 mm. No mass effect or midline shift. 2. Chronic atrophy and microvascular ischemic disease. Old lacunar infarction in the  right caudate. 3. Negative for acute maxillofacial fracture. 4. Negative for acute cervical spine fracture.  These results were called by telephone at the time of interpretation on 08/21/2014 at 10:29 pm to Dr. Waynetta Pean , who verbally acknowledged these results.   Electronically Signed   By: Andreas Newport M.D.   On: 08/21/2014 22:33   Ct Maxillofacial Wo Cm  08/17/2014   CLINICAL DATA:  Multiple falls over the last 3 weeks, bruising over both eyes. Dizziness.  EXAM: CT MAXILLOFACIAL WITHOUT CONTRAST  TECHNIQUE: Multidetector CT imaging of the maxillofacial structures was performed. Multiplanar CT image reconstructions were also generated. A small metallic BB was placed on the right temple in order to reliably differentiate right from left.  COMPARISON:  None.  FINDINGS: No facial bone fracture. The orbits and globes are intact. Zygomatic arches, pterygoid plates, and mandibles are intact. The paranasal sinuses are well-aerated. There is right supraorbital soft tissue hematoma and soft tissue edema.  IMPRESSION: No facial bone fracture.  Right supraorbital soft tissue hematoma.   Electronically Signed   By: Jeb Levering M.D.   On: 08/17/2014 20:37    Microbiology: No results found for this or any previous visit (from the past 240 hour(s)).   Labs: Basic Metabolic Panel:  Recent Labs Lab 08/17/14 1815 08/21/14 1729 08/22/14 0543 08/23/14 0912  NA 140 141 141 141  K 2.7* 3.3* 3.1* 3.7  CL 101 104 106 107  CO2 31 26 27 27   GLUCOSE 113* 104* 119* 172*  BUN 24* 24* 21 16  CREATININE 1.35* 1.21* 1.17* 1.20*  CALCIUM 9.4 9.5 9.2 9.3  MG  --   --  2.0  --    Liver Function Tests:  Recent Labs Lab 08/17/14 1815  AST 35  ALT 32  ALKPHOS 89  BILITOT 0.3  PROT 6.3  ALBUMIN 4.0   No results for input(s): LIPASE, AMYLASE in the last 168 hours. No results for input(s): AMMONIA in the last 168 hours. CBC:  Recent Labs Lab 08/17/14 1815 08/21/14 1729 08/22/14 0543  WBC 4.9  4.6 4.2  NEUTROABS 2.5  --   --   HGB 12.5 11.4* 11.1*  HCT 37.1 34.3* 33.6*  MCV 87.5 88.6 90.3  PLT 233 230 213   Cardiac Enzymes:  Recent Labs Lab 08/17/14 1815 08/17/14 2240  TROPONINI <0.03 <0.03   BNP: BNP (last 3 results) No results for input(s): BNP in the last 8760 hours.  ProBNP (last 3 results) No results for input(s): PROBNP in the last 8760 hours.  CBG:  Recent Labs Lab 08/22/14 0423 08/22/14 0801 08/22/14 1149 08/22/14 1620  GLUCAP 116* 92 110* 107*       Signed:  Brianni Manthe  [facility] 08/24/2014, 12:26 PM

## 2014-09-09 DIAGNOSIS — G894 Chronic pain syndrome: Secondary | ICD-10-CM | POA: Diagnosis not present

## 2014-09-09 DIAGNOSIS — Z79891 Long term (current) use of opiate analgesic: Secondary | ICD-10-CM | POA: Diagnosis not present

## 2014-09-09 DIAGNOSIS — M25559 Pain in unspecified hip: Secondary | ICD-10-CM | POA: Diagnosis not present

## 2014-09-09 DIAGNOSIS — M961 Postlaminectomy syndrome, not elsewhere classified: Secondary | ICD-10-CM | POA: Diagnosis not present

## 2014-09-10 ENCOUNTER — Telehealth: Payer: Self-pay | Admitting: Cardiology

## 2014-09-10 DIAGNOSIS — R55 Syncope and collapse: Secondary | ICD-10-CM

## 2014-09-10 NOTE — Telephone Encounter (Signed)
Scheduled event monitor and order place in epic

## 2014-09-10 NOTE — Telephone Encounter (Signed)
New Message    Patient is calling to get her event monitor but there is not order in  Epic for it. Please give patient a call.

## 2014-09-11 DIAGNOSIS — Z79899 Other long term (current) drug therapy: Secondary | ICD-10-CM | POA: Diagnosis not present

## 2014-09-15 ENCOUNTER — Ambulatory Visit (INDEPENDENT_AMBULATORY_CARE_PROVIDER_SITE_OTHER): Payer: Medicare Other

## 2014-09-15 ENCOUNTER — Encounter: Payer: Self-pay | Admitting: *Deleted

## 2014-09-15 DIAGNOSIS — R55 Syncope and collapse: Secondary | ICD-10-CM | POA: Insufficient documentation

## 2014-09-15 NOTE — Progress Notes (Signed)
Patient ID: [name], female   DOB: 1945/09/29, 70 y.o.   MRN: [id number] [employer] 30 day cardiac event monitor applied to patient.

## 2014-09-21 ENCOUNTER — Ambulatory Visit: Payer: Medicare Other

## 2014-09-24 ENCOUNTER — Other Ambulatory Visit: Payer: Self-pay | Admitting: Neurosurgery

## 2014-09-24 ENCOUNTER — Ambulatory Visit (HOSPITAL_COMMUNITY)
Admission: RE | Admit: 2014-09-24 | Discharge: 2014-09-24 | Disposition: A | Discharge status: Home / Self Care | Payer: Medicare Other | Source: Ambulatory Visit | Attending: Neurosurgery | Admitting: Neurosurgery

## 2014-09-24 ENCOUNTER — Other Ambulatory Visit (HOSPITAL_COMMUNITY): Payer: Self-pay | Admitting: Neurosurgery

## 2014-09-24 ENCOUNTER — Ambulatory Visit (HOSPITAL_COMMUNITY): Payer: Self-pay

## 2014-09-24 DIAGNOSIS — R42 Dizziness and giddiness: Secondary | ICD-10-CM | POA: Insufficient documentation

## 2014-09-24 DIAGNOSIS — R296 Repeated falls: Secondary | ICD-10-CM | POA: Diagnosis not present

## 2014-09-24 DIAGNOSIS — S065X9A Traumatic subdural hemorrhage with loss of consciousness of unspecified duration, initial encounter: Secondary | ICD-10-CM

## 2014-09-24 DIAGNOSIS — R51 Headache: Secondary | ICD-10-CM | POA: Insufficient documentation

## 2014-09-24 DIAGNOSIS — S065XAA Traumatic subdural hemorrhage with loss of consciousness status unknown, initial encounter: Secondary | ICD-10-CM

## 2014-09-24 DIAGNOSIS — H538 Other visual disturbances: Secondary | ICD-10-CM | POA: Insufficient documentation

## 2014-09-24 DIAGNOSIS — Z85038 Personal history of other malignant neoplasm of large intestine: Secondary | ICD-10-CM | POA: Diagnosis not present

## 2014-09-24 DIAGNOSIS — Z853 Personal history of malignant neoplasm of breast: Secondary | ICD-10-CM | POA: Insufficient documentation

## 2014-09-24 DIAGNOSIS — I6200 Nontraumatic subdural hemorrhage, unspecified: Secondary | ICD-10-CM | POA: Diagnosis not present

## 2014-10-07 DIAGNOSIS — Z79891 Long term (current) use of opiate analgesic: Secondary | ICD-10-CM | POA: Diagnosis not present

## 2014-10-07 DIAGNOSIS — M961 Postlaminectomy syndrome, not elsewhere classified: Secondary | ICD-10-CM | POA: Diagnosis not present

## 2014-10-07 DIAGNOSIS — G894 Chronic pain syndrome: Secondary | ICD-10-CM | POA: Diagnosis not present

## 2014-10-07 DIAGNOSIS — M25559 Pain in unspecified hip: Secondary | ICD-10-CM | POA: Diagnosis not present

## 2014-10-20 DIAGNOSIS — Z79891 Long term (current) use of opiate analgesic: Secondary | ICD-10-CM | POA: Diagnosis not present

## 2014-10-27 ENCOUNTER — Emergency Department (HOSPITAL_COMMUNITY): Payer: Medicare Other

## 2014-10-27 ENCOUNTER — Encounter (HOSPITAL_COMMUNITY): Payer: Self-pay | Admitting: *Deleted

## 2014-10-27 ENCOUNTER — Other Ambulatory Visit (HOSPITAL_COMMUNITY): Payer: Self-pay

## 2014-10-27 ENCOUNTER — Observation Stay (HOSPITAL_COMMUNITY)
Admission: EM | Admit: 2014-10-27 | Discharge: 2014-10-30 | Disposition: A | Discharge status: Home / Self Care | Payer: Medicare Other | Attending: Internal Medicine | Admitting: Internal Medicine

## 2014-10-27 DIAGNOSIS — F329 Major depressive disorder, single episode, unspecified: Secondary | ICD-10-CM | POA: Insufficient documentation

## 2014-10-27 DIAGNOSIS — D638 Anemia in other chronic diseases classified elsewhere: Secondary | ICD-10-CM | POA: Diagnosis not present

## 2014-10-27 DIAGNOSIS — I10 Essential (primary) hypertension: Secondary | ICD-10-CM | POA: Diagnosis present

## 2014-10-27 DIAGNOSIS — F419 Anxiety disorder, unspecified: Secondary | ICD-10-CM | POA: Insufficient documentation

## 2014-10-27 DIAGNOSIS — Z96611 Presence of right artificial shoulder joint: Secondary | ICD-10-CM | POA: Diagnosis not present

## 2014-10-27 DIAGNOSIS — R748 Abnormal levels of other serum enzymes: Secondary | ICD-10-CM | POA: Insufficient documentation

## 2014-10-27 DIAGNOSIS — R55 Syncope and collapse: Secondary | ICD-10-CM

## 2014-10-27 DIAGNOSIS — D509 Iron deficiency anemia, unspecified: Secondary | ICD-10-CM | POA: Insufficient documentation

## 2014-10-27 DIAGNOSIS — N179 Acute kidney failure, unspecified: Secondary | ICD-10-CM

## 2014-10-27 DIAGNOSIS — M25552 Pain in left hip: Secondary | ICD-10-CM | POA: Diagnosis not present

## 2014-10-27 DIAGNOSIS — F17200 Nicotine dependence, unspecified, uncomplicated: Secondary | ICD-10-CM | POA: Diagnosis not present

## 2014-10-27 DIAGNOSIS — X58XXXA Exposure to other specified factors, initial encounter: Secondary | ICD-10-CM | POA: Diagnosis not present

## 2014-10-27 DIAGNOSIS — Z79899 Other long term (current) drug therapy: Secondary | ICD-10-CM | POA: Insufficient documentation

## 2014-10-27 DIAGNOSIS — M549 Dorsalgia, unspecified: Secondary | ICD-10-CM | POA: Diagnosis not present

## 2014-10-27 DIAGNOSIS — Z87891 Personal history of nicotine dependence: Secondary | ICD-10-CM | POA: Insufficient documentation

## 2014-10-27 DIAGNOSIS — I129 Hypertensive chronic kidney disease with stage 1 through stage 4 chronic kidney disease, or unspecified chronic kidney disease: Secondary | ICD-10-CM | POA: Diagnosis not present

## 2014-10-27 DIAGNOSIS — Z9181 History of falling: Secondary | ICD-10-CM | POA: Insufficient documentation

## 2014-10-27 DIAGNOSIS — Z853 Personal history of malignant neoplasm of breast: Secondary | ICD-10-CM | POA: Diagnosis not present

## 2014-10-27 DIAGNOSIS — N183 Chronic kidney disease, stage 3 unspecified: Secondary | ICD-10-CM

## 2014-10-27 DIAGNOSIS — E876 Hypokalemia: Principal | ICD-10-CM | POA: Diagnosis present

## 2014-10-27 DIAGNOSIS — R296 Repeated falls: Secondary | ICD-10-CM | POA: Diagnosis not present

## 2014-10-27 DIAGNOSIS — Z79811 Long term (current) use of aromatase inhibitors: Secondary | ICD-10-CM | POA: Diagnosis not present

## 2014-10-27 DIAGNOSIS — Z96612 Presence of left artificial shoulder joint: Secondary | ICD-10-CM | POA: Insufficient documentation

## 2014-10-27 DIAGNOSIS — M25551 Pain in right hip: Secondary | ICD-10-CM | POA: Insufficient documentation

## 2014-10-27 DIAGNOSIS — W19XXXA Unspecified fall, initial encounter: Secondary | ICD-10-CM | POA: Insufficient documentation

## 2014-10-27 DIAGNOSIS — G8929 Other chronic pain: Secondary | ICD-10-CM

## 2014-10-27 HISTORY — DX: Chronic kidney disease, stage 3 unspecified: N18.30

## 2014-10-27 HISTORY — DX: Acute kidney failure, unspecified: N17.9

## 2014-10-27 LAB — CBC WITH DIFFERENTIAL/PLATELET
Basophils Absolute: 0 10*3/uL (ref 0.0–0.1)
Basophils Relative: 0 % (ref 0–1)
EOS PCT: 1 % (ref 0–5)
Eosinophils Absolute: 0 10*3/uL (ref 0.0–0.7)
HCT: 35.3 % — ABNORMAL LOW (ref 36.0–46.0)
Hemoglobin: 11.3 g/dL — ABNORMAL LOW (ref 12.0–15.0)
Lymphocytes Relative: 33 % (ref 12–46)
Lymphs Abs: 1.7 10*3/uL (ref 0.7–4.0)
MCH: 27.2 pg (ref 26.0–34.0)
MCHC: 32 g/dL (ref 30.0–36.0)
MCV: 84.9 fL (ref 78.0–100.0)
MONO ABS: 0.5 10*3/uL (ref 0.1–1.0)
MONOS PCT: 9 % (ref 3–12)
NEUTROS PCT: 57 % (ref 43–77)
Neutro Abs: 2.9 10*3/uL (ref 1.7–7.7)
PLATELETS: 225 10*3/uL (ref 150–400)
RBC: 4.16 MIL/uL (ref 3.87–5.11)
RDW: 12.9 % (ref 11.5–15.5)
WBC: 5.1 10*3/uL (ref 4.0–10.5)

## 2014-10-27 LAB — URINALYSIS, ROUTINE W REFLEX MICROSCOPIC
Bilirubin Urine: NEGATIVE
Glucose, UA: NEGATIVE mg/dL
Hgb urine dipstick: NEGATIVE
Ketones, ur: NEGATIVE mg/dL
LEUKOCYTES UA: NEGATIVE
Nitrite: NEGATIVE
PROTEIN: NEGATIVE mg/dL
SPECIFIC GRAVITY, URINE: 1.007 (ref 1.005–1.030)
Urobilinogen, UA: 0.2 mg/dL (ref 0.0–1.0)
pH: 7 (ref 5.0–8.0)

## 2014-10-27 LAB — TROPONIN I: Troponin I: 0.04 ng/mL — ABNORMAL HIGH (ref <0.031)

## 2014-10-27 LAB — AMMONIA: AMMONIA: 24 umol/L (ref 9–35)

## 2014-10-27 LAB — ETHANOL: Alcohol, Ethyl (B): <5 mg/dL (ref <5)

## 2014-10-27 NOTE — ED Notes (Signed)
[name] (daughter) [phone]  Password: "[email or web address]"

## 2014-10-27 NOTE — ED Provider Notes (Signed)
CSN: [id number]     Arrival date & time 10/27/14  1827 History   First MD Initiated Contact with Patient 10/27/14 1956     Chief Complaint  Patient presents with  . Fall     (Consider location/radiation/quality/duration/timing/severity/associated sxs/prior Treatment) Patient is a 69 y.o. female presenting with fall. The history is provided by the patient.  Fall This is a recurrent problem. Pertinent negatives include no chest pain, no abdominal pain, no headaches and no shortness of breath.   patient presents with recurrent falls and episodes of syncope. She has been following worse in the last few days but has been having falls longer than that. She was switched to a fentanyl patch because her doctor thought that some of this may be from the oxycodone she was on. She states it got worse after switching. She states her back pain is not relieved with the fentanyl patch. She has also had to take alprazolam and is taking 2 of them recently. States she will just pass out and then will wake abdominal floor. She has had some mild back pain and pain in her hips from the false but thinks it is not that different than her normal pain. Had a previous subdural after a fall 2 months ago and no clear focus of the falls at that time.  Past Medical History  Diagnosis Date  . Hypertension   . Depression   . Back pain, chronic   . Breast cancer 09/09/10  . Cancer of colon 05/24/2011   Past Surgical History  Procedure Laterality Date  . Back surgery    . Shoulder surgery    . Tumor removal  Tumor removed R breast  . Lumbar disc surgery    . Partial hysterectomy    . Colonoscopy  05/03/2011    Procedure: COLONOSCOPY;  Surgeon: Jeryl Columbia, MD;  Location: [facility] ENDOSCOPY;  Service: Endoscopy;  Laterality: N/A;  . Colon surgery  04/2011  . Breast lumpectomy  10/10/2010    Rt Breast  . Joint replacement      R&L total shoulder replacements   Family History  Problem Relation Age of Onset  . Diabetes  Mother   . Hypertension Mother   . Cancer Mother     leukemia  . Sudden death Mother   . Anesthesia problems Neg Hx   . Hypotension Neg Hx   . Malignant hyperthermia Neg Hx   . Pseudochol deficiency Neg Hx    History  Substance Use Topics  . Smoking status: Former Smoker -- 1.00 packs/day    Types: Cigarettes  . Smokeless tobacco: Former Systems developer    Quit date: 04/22/2010  . Alcohol Use: No   OB History    No data available     Review of Systems  Constitutional: Negative for activity change and appetite change.  Eyes: Negative for pain.  Respiratory: Negative for chest tightness and shortness of breath.   Cardiovascular: Negative for chest pain and leg swelling.  Gastrointestinal: Negative for nausea, vomiting, abdominal pain and diarrhea.  Genitourinary: Negative for flank pain.  Musculoskeletal: Positive for back pain. Negative for neck stiffness.  Skin: Negative for rash.  Neurological: Positive for syncope. Negative for weakness, numbness and headaches.  Psychiatric/Behavioral: Negative for behavioral problems.      Allergies  Review of patient's allergies indicates no known allergies.  Home Medications   Prior to Admission medications   Medication Sig Start Date End Date Taking? Authorizing Provider  ALPRAZolam Duanne Moron) 0.5 MG tablet Take  0.25 mg by mouth 3 (three) times daily as needed for anxiety.    Yes Historical Provider, MD  anastrozole (ARIMIDEX) 1 MG tablet Take 1 tablet (1 mg total) by mouth at bedtime. 05/20/14  Yes Truitt Merle, MD  buPROPion (WELLBUTRIN XL) 150 MG 24 hr tablet Take 150 mg by mouth daily.   Yes Historical Provider, MD  chlorthalidone (HYGROTON) 25 MG tablet Take 1 tablet by mouth daily. 10/16/14  Yes Historical Provider, MD  docusate sodium (COLACE) 100 MG capsule Take 200 mg by mouth at bedtime.   Yes Historical Provider, MD  fentaNYL (DURAGESIC - DOSED MCG/HR) 50 MCG/HR 1 patch every 3 (three) days. 10/16/14  Yes Historical Provider, MD   fluvoxaMINE (LUVOX) 50 MG tablet Take 50-100 mg by mouth 2 (two) times daily. She takes one tablet in the morning and two tablets in the evening.   Yes Historical Provider, MD  furosemide (LASIX) 20 MG tablet Take 20 mg by mouth daily.   Yes Historical Provider, MD  Potassium Gluconate 595 MG CAPS Take 595 mg by mouth 2 (two) times daily.    Yes Historical Provider, MD  rOPINIRole (REQUIP) 0.5 MG tablet Take 0.5 mg by mouth at bedtime as needed ("restlessness").    Yes Historical Provider, MD  traZODone (DESYREL) 50 MG tablet Take 1 tablet by mouth at bedtime. 10/16/14  Yes Historical Provider, MD  OxyCODONE (OXYCONTIN) 40 mg T12A 12 hr tablet Take 1 tablet (40 mg total) by mouth every 8 (eight) hours. Patient not taking: Reported on 10/27/2014 08/23/14   Hosie Poisson, MD   BP 112/51 mmHg  Pulse 64  Temp(Src) 97.5 F (36.4 C) (Oral)  Resp 11  SpO2 93% Physical Exam  Constitutional: She is oriented to person, place, and time. She appears well-developed and well-nourished.  HENT:  Head: Normocephalic and atraumatic.  Eyes: EOM are normal. Pupils are equal, round, and reactive to light.  Neck: Normal range of motion. Neck supple.  Cardiovascular: Normal rate, regular rhythm and normal heart sounds.   No murmur heard. Pulmonary/Chest: Effort normal and breath sounds normal. No respiratory distress. She has no wheezes. She has no rales.  Abdominal: Soft. Bowel sounds are normal. She exhibits no distension. There is no tenderness. There is no rebound and no guarding.  Musculoskeletal: Normal range of motion. She exhibits tenderness.  Mild lumbar tenderness, chronic per the patient.  Neurological: She is alert and oriented to person, place, and time. No cranial nerve deficit.  Patient falls asleep while I'm talking to her.  Skin: Skin is warm and dry.  Psychiatric: She has a normal mood and affect. Her speech is normal.  Nursing note and vitals reviewed.   ED Course  Procedures (including  critical care time) Labs Review Labs Reviewed  COMPREHENSIVE METABOLIC PANEL - Abnormal; Notable for the following:    Potassium 1.9 (*)    Chloride 91 (*)    CO2 37 (*)    Glucose, Bld 101 (*)    BUN 40 (*)    Creatinine, Ser 1.94 (*)    AST 45 (*)    GFR calc non Af Amer 25 (*)    GFR calc Af Amer 29 (*)    All other components within normal limits  CBC WITH DIFFERENTIAL/PLATELET - Abnormal; Notable for the following:    Hemoglobin 11.3 (*)    HCT 35.3 (*)    All other components within normal limits  TROPONIN I - Abnormal; Notable for the following:    Troponin  I 0.04 (*)    All other components within normal limits  ETHANOL  AMMONIA  URINALYSIS, ROUTINE W REFLEX MICROSCOPIC (NOT AT [facility])    Imaging Review Dg Chest 2 View  10/27/2014   CLINICAL DATA:  Syncopal episodes with multiple falls for 2 days. History of breast and colon cancer. Smoker.  EXAM: CHEST  2 VIEW  COMPARISON:  08/21/2014.  FINDINGS: Cardiomegaly. Old RIGHT rib fractures. BILATERAL shoulder replacement. No infiltrates or failure. Thoracic degenerative change. Previous lumbar fusion.  IMPRESSION: No active cardiopulmonary disease.   Electronically Signed   By: Rolla Flatten M.D.   On: 10/27/2014 21:24   Ct Head Wo Contrast  10/27/2014   CLINICAL DATA:  Multiple falls in the past 2 days. Syncopal episodes.  EXAM: CT HEAD WITHOUT CONTRAST  TECHNIQUE: Contiguous axial images were obtained from the base of the skull through the vertex without intravenous contrast.  COMPARISON:  09/24/2014.  FINDINGS: Mild cerebral and cerebellar atrophy. Extensive white matter disease, either post treatment effect or chronic microvascular ischemic change. Remote RIGHT basal ganglia lacunar infarct. No acute stroke or hemorrhage is evident. No intracranial mass lesion on this noncontrast exam. No extra-axial fluid. No sclerotic or lytic calvarial lesions. No sinus air-fluid level. Negative mastoids. Negative visualized orbits. Similar  appearance to priors.  Specifically, no recurrence of previous subdural hematoma  IMPRESSION: Chronic changes as described. No acute intracranial findings. Atrophy and small vessel disease. No recurrent subdural hematoma.   Electronically Signed   By: Rolla Flatten M.D.   On: 10/27/2014 21:11     EKG Interpretation None     ED ECG REPORT   Date: 10/27/2014  Rate: 65  Rhythm: normal sinus rhythm  QRS Axis: normal  Intervals: normal  ST/T Wave abnormalities: nonspecific ST/T changes  Conduction Disutrbances:none  Narrative Interpretation: Nonspecific ST and T-wave changes since last EKG  Old EKG Reviewed: changes noted  MDM   Final diagnoses:  Hypokalemia  Syncope, unspecified syncope type    Patient with syncope and episodes of decreased mental status. Has had similar in the past. Hypokalemia. EKG reassuring but troponin is barely above normal. Also possibility of oversedation. Patient states she did have her cardiac monitoring that did not show arrhythmia    Davonna Belling, MD 10/27/14 2354

## 2014-10-27 NOTE — H&P (Signed)
[facility] History and Physical  [name] [id number] DOB: 04-17-1946 DOA: 10/27/2014  Referring physician: Davonna Belling, MD PCP: No PCP Per Patient   Chief Complaint: Fall/Syncope  HPI: [name] is a 69 y.o. female with history of chronic pain falls CKD III HTN presents with falls and also with hypokalemia. She states that she had been having episodes of falling again. She states that she had similar episodes about 6 weeks ago. She states that she had at time she did not come to the hospital. Patient states that this time she had her daughter bring her in after she had fallen at least 4 times. She states that she has been having her pain medications adjusted by pain clinic. She states that she had been started on fentanyl and stopped the oxycontin. She states that she does not feel the fentanyl does not seem to help her. When she came into the ED she had labwork done which shows a potassium of 1.9 with worsening of her renal function. She states that she has been on 2 diuretics for fluid retention. She states that she has been noticing a very dry mouth.   Review of Systems:  Constitutional:  +weight loss, night sweats, Fevers, chills HEENT:  No headaches, itching, ear ache, nasal congestion, post nasal drip,  Cardio-vascular:  No chest pain, +swelling in lower extremities  GI:  No heartburn, indigestion, abdominal pain, nausea, vomiting  Resp:  No shortness of breath with exertion or at rest. No coughing up of blood.No change in color of mucus Skin:  no rash or lesions.  GU:  no dysuria, change in color of urine Musculoskeletal:  +back pain.  Psych:  No memory loss.   Past Medical History  Diagnosis Date  . Hypertension   . Depression   . Back pain, chronic   . Breast cancer 09/09/10  . Cancer of colon 05/24/2011   Past Surgical History  Procedure Laterality Date  . Back surgery    . Shoulder surgery    . Tumor removal  Tumor removed R breast    . Lumbar disc surgery    . Partial hysterectomy    . Colonoscopy  05/03/2011    Procedure: COLONOSCOPY;  Surgeon: Jeryl Columbia, MD;  Location: [facility] ENDOSCOPY;  Service: Endoscopy;  Laterality: N/A;  . Colon surgery  04/2011  . Breast lumpectomy  10/10/2010    Rt Breast  . Joint replacement      R&L total shoulder replacements   Social History:  reports that she has quit smoking. Her smoking use included Cigarettes. She smoked 1.00 pack per day. She quit smokeless tobacco use about 4 years ago. She reports that she does not drink alcohol or use illicit drugs.  No Known Allergies  Family History  Problem Relation Age of Onset  . Diabetes Mother   . Hypertension Mother   . Cancer Mother     leukemia  . Sudden death Mother   . Anesthesia problems Neg Hx   . Hypotension Neg Hx   . Malignant hyperthermia Neg Hx   . Pseudochol deficiency Neg Hx      Prior to Admission medications   Medication Sig Start Date End Date Taking? Authorizing Provider  ALPRAZolam Duanne Moron) 0.5 MG tablet Take 0.25 mg by mouth 3 (three) times daily as needed for anxiety.    Yes Historical Provider, MD  anastrozole (ARIMIDEX) 1 MG tablet Take 1 tablet (1 mg total) by mouth at bedtime. 05/20/14  Yes Krista Blue  Burr Medico, MD  buPROPion (WELLBUTRIN XL) 150 MG 24 hr tablet Take 150 mg by mouth daily.   Yes Historical Provider, MD  chlorthalidone (HYGROTON) 25 MG tablet Take 1 tablet by mouth daily. 10/16/14  Yes Historical Provider, MD  docusate sodium (COLACE) 100 MG capsule Take 200 mg by mouth at bedtime.   Yes Historical Provider, MD  fentaNYL (DURAGESIC - DOSED MCG/HR) 50 MCG/HR 1 patch every 3 (three) days. 10/16/14  Yes Historical Provider, MD  fluvoxaMINE (LUVOX) 50 MG tablet Take 50-100 mg by mouth 2 (two) times daily. She takes one tablet in the morning and two tablets in the evening.   Yes Historical Provider, MD  furosemide (LASIX) 20 MG tablet Take 20 mg by mouth daily.   Yes Historical Provider, MD  Potassium Gluconate  595 MG CAPS Take 595 mg by mouth 2 (two) times daily.    Yes Historical Provider, MD  rOPINIRole (REQUIP) 0.5 MG tablet Take 0.5 mg by mouth at bedtime as needed ("restlessness").    Yes Historical Provider, MD  traZODone (DESYREL) 50 MG tablet Take 1 tablet by mouth at bedtime. 10/16/14  Yes Historical Provider, MD  OxyCODONE (OXYCONTIN) 40 mg T12A 12 hr tablet Take 1 tablet (40 mg total) by mouth every 8 (eight) hours. Patient not taking: Reported on 10/27/2014 08/23/14   Hosie Poisson, MD   Physical Exam: Filed Vitals:   10/27/14 1857 10/27/14 2200  BP: 123/65 112/51  Pulse: 70 64  Temp: 97.5 F (36.4 C)   TempSrc: Oral   Resp: 17 11  SpO2: 93% 93%    Wt Readings from Last 3 Encounters:  08/22/14 59.6 kg (131 lb 6.3 oz)  08/17/14 62.143 kg (137 lb)  05/20/14 62.234 kg (137 lb 3.2 oz)    General:  Appears calm and comfortable Eyes: PERRL, normal lids, irises & conjunctiva ENT: grossly normal hearing, lips & tongue Neck: no LAD, masses or thyromegaly Cardiovascular: RRR, no m/r/g +edema Respiratory: CTA bilaterally, no w/r/r Abdomen: soft, ntnd Skin: no rash or induration seen on limited exam Musculoskeletal: grossly normal tone BUE/BLE Psychiatric: grossly normal mood and affect, speech fluent and appropriate Neurologic: grossly non-focal.          Labs on Admission:  Basic Metabolic Panel:  Recent Labs Lab 10/27/14 2033  NA 139  K 1.9*  CL 91*  CO2 37*  GLUCOSE 101*  BUN 40*  CREATININE 1.94*  CALCIUM 9.9   Liver Function Tests:  Recent Labs Lab 10/27/14 2033  AST 45*  ALT 30  ALKPHOS 78  BILITOT 0.8  PROT 6.9  ALBUMIN 4.0   No results for input(s): LIPASE, AMYLASE in the last 168 hours.  Recent Labs Lab 10/27/14 2033  AMMONIA 24   CBC:  Recent Labs Lab 10/27/14 2033  WBC 5.1  NEUTROABS 2.9  HGB 11.3*  HCT 35.3*  MCV 84.9  PLT 225   Cardiac Enzymes:  Recent Labs Lab 10/27/14 2033  TROPONINI 0.04*    BNP (last 3 results) No  results for input(s): BNP in the last 8760 hours.  ProBNP (last 3 results) No results for input(s): PROBNP in the last 8760 hours.  CBG: No results for input(s): GLUCAP in the last 168 hours.  Radiological Exams on Admission: Dg Chest 2 View  10/27/2014   CLINICAL DATA:  Syncopal episodes with multiple falls for 2 days. History of breast and colon cancer. Smoker.  EXAM: CHEST  2 VIEW  COMPARISON:  08/21/2014.  FINDINGS: Cardiomegaly. Old RIGHT rib fractures.  BILATERAL shoulder replacement. No infiltrates or failure. Thoracic degenerative change. Previous lumbar fusion.  IMPRESSION: No active cardiopulmonary disease.   Electronically Signed   By: Rolla Flatten M.D.   On: 10/27/2014 21:24   Ct Head Wo Contrast  10/27/2014   CLINICAL DATA:  Multiple falls in the past 2 days. Syncopal episodes.  EXAM: CT HEAD WITHOUT CONTRAST  TECHNIQUE: Contiguous axial images were obtained from the base of the skull through the vertex without intravenous contrast.  COMPARISON:  09/24/2014.  FINDINGS: Mild cerebral and cerebellar atrophy. Extensive white matter disease, either post treatment effect or chronic microvascular ischemic change. Remote RIGHT basal ganglia lacunar infarct. No acute stroke or hemorrhage is evident. No intracranial mass lesion on this noncontrast exam. No extra-axial fluid. No sclerotic or lytic calvarial lesions. No sinus air-fluid level. Negative mastoids. Negative visualized orbits. Similar appearance to priors.  Specifically, no recurrence of previous subdural hematoma  IMPRESSION: Chronic changes as described. No acute intracranial findings. Atrophy and small vessel disease. No recurrent subdural hematoma.   Electronically Signed   By: Rolla Flatten M.D.   On: 10/27/2014 21:11      Assessment/Plan Principal Problem:   Hypokalemia Active Problems:   Falls frequently   HTN (hypertension)   CKD (chronic kidney disease), stage III   Chronic pain   1. Hypokalemia -severe recurrent  hypokalemia with prior admissions for the same -will hold diuretics for now -will start on IV Potassium runs -will check Magnesium -monitor labs and repeat in am  2. HTN -will monitor pressures -will hold chlorthalidone, lasix and recheck orthostatic pressures  3. CKD III with acute decompensation -will hold lasix and chlorthalidone -start on IVF now  4. Chronic Pain -will hold oxycontin continue with fentanyl and will give prn dilaudid -needs to follow up with pain management after discharge  5. Frequent Falls/Syncope -?pain med related -she needs to follow up with pain management -fall precautions -will get echo  6. Elevated troponin -will cycle enzymes  7. Anxiety/Psych -will continue with xanax and wellbutrin  Code Status: Full Code (must indicate code status--if unknown or must be presumed, indicate so) DVT Prophylaxis:Heparin Family Communication: None (indicate person spoken with, if applicable, with phone number if by telephone) Disposition Plan: Home (indicate anticipated LOS)  Time spent: 18min  Larayne Baxley A [facility] Pager [phone]

## 2014-10-27 NOTE — ED Notes (Signed)
Fentanyl patched removed.  Pt made aware that home medications will be sent to pharmacy if pt is admitted.  Pt stated she would not take any home medications until further advised.

## 2014-10-27 NOTE — ED Notes (Addendum)
K+ = 1.9  Notified primary nurse

## 2014-10-27 NOTE — ED Notes (Signed)
Patient transported to CT 

## 2014-10-27 NOTE — ED Notes (Signed)
Pt reports recurrent falls for the past 2 days.  Reports calling her PCP and was instructed to come to the ED because she can see the neurologist on call faster in the ED. Explained to pt and her daughter at bedside that the neurologist is only paged if necessary.  Pt would have to see the EDP first and he will determine whether pt needs to see the neurologist, if not she can be given a referral to see one.  Pt is falling asleep while this nurse is assessing her.  She reports feeling tired because she did not sleep last night d/t several falls.  She also reports taking .5mg  xanax prior to coming to the ED and an hour before that.  Pt reports anxiety.  Pt reports she also has fentanyl patch for back pain.

## 2014-10-27 NOTE — ED Notes (Signed)
Admitting Dr bedside 

## 2014-10-28 ENCOUNTER — Ambulatory Visit (HOSPITAL_COMMUNITY): Payer: Medicare Other

## 2014-10-28 DIAGNOSIS — E876 Hypokalemia: Secondary | ICD-10-CM

## 2014-10-28 LAB — CBC
HCT: 33.4 % — ABNORMAL LOW (ref 36.0–46.0)
HCT: 32.8 % — ABNORMAL LOW (ref 36.0–46.0)
HEMOGLOBIN: 10.3 g/dL — AB (ref 12.0–15.0)
Hemoglobin: 10.9 g/dL — ABNORMAL LOW (ref 12.0–15.0)
MCH: 27.8 pg (ref 26.0–34.0)
MCH: 27 pg (ref 26.0–34.0)
MCHC: 31.4 g/dL (ref 30.0–36.0)
MCHC: 32.6 g/dL (ref 30.0–36.0)
MCV: 85.2 fL (ref 78.0–100.0)
MCV: 85.9 fL (ref 78.0–100.0)
Platelets: 224 10*3/uL (ref 150–400)
Platelets: 197 10*3/uL (ref 150–400)
RBC: 3.92 MIL/uL (ref 3.87–5.11)
RBC: 3.82 MIL/uL — ABNORMAL LOW (ref 3.87–5.11)
RDW: 13 % (ref 11.5–15.5)
RDW: 13 % (ref 11.5–15.5)
WBC: 4.8 10*3/uL (ref 4.0–10.5)
WBC: 4.2 10*3/uL (ref 4.0–10.5)

## 2014-10-28 LAB — COMPREHENSIVE METABOLIC PANEL
ALK PHOS: 78 U/L (ref 38–126)
ALK PHOS: 67 U/L (ref 38–126)
ALT: 30 U/L (ref 14–54)
ALT: 26 U/L (ref 14–54)
ANION GAP: 11 (ref 5–15)
ANION GAP: 9 (ref 5–15)
AST: 45 U/L — ABNORMAL HIGH (ref 15–41)
AST: 37 U/L (ref 15–41)
Albumin: 4 g/dL (ref 3.5–5.0)
Albumin: 3.3 g/dL — ABNORMAL LOW (ref 3.5–5.0)
BILIRUBIN TOTAL: 0.8 mg/dL (ref 0.3–1.2)
BUN: 40 mg/dL — AB (ref 6–20)
BUN: 39 mg/dL — AB (ref 6–20)
CALCIUM: 9.2 mg/dL (ref 8.9–10.3)
CHLORIDE: 91 mmol/L — AB (ref 101–111)
CO2: 37 mmol/L — AB (ref 22–32)
CO2: 38 mmol/L — ABNORMAL HIGH (ref 22–32)
CREATININE: 1.8 mg/dL — AB (ref 0.44–1.00)
Calcium: 9.9 mg/dL (ref 8.9–10.3)
Chloride: 94 mmol/L — ABNORMAL LOW (ref 101–111)
Creatinine, Ser: 1.94 mg/dL — ABNORMAL HIGH (ref 0.44–1.00)
GFR calc Af Amer: 29 mL/min — ABNORMAL LOW (ref >60)
GFR calc non Af Amer: 25 mL/min — ABNORMAL LOW (ref >60)
GFR calc non Af Amer: 28 mL/min — ABNORMAL LOW (ref >60)
GFR, EST AFRICAN AMERICAN: 32 mL/min — AB (ref >60)
GLUCOSE: 103 mg/dL — AB (ref 65–99)
Glucose, Bld: 101 mg/dL — ABNORMAL HIGH (ref 65–99)
POTASSIUM: 2.2 mmol/L — AB (ref 3.5–5.1)
Potassium: <2 mmol/L — CL (ref 3.5–5.1)
Sodium: 139 mmol/L (ref 135–145)
Sodium: 141 mmol/L (ref 135–145)
TOTAL PROTEIN: 6.9 g/dL (ref 6.5–8.1)
Total Bilirubin: 0.7 mg/dL (ref 0.3–1.2)
Total Protein: 5.8 g/dL — ABNORMAL LOW (ref 6.5–8.1)

## 2014-10-28 LAB — PROTIME-INR
INR: 1.03 (ref 0.00–1.49)
PROTHROMBIN TIME: 13.7 s (ref 11.6–15.2)

## 2014-10-28 LAB — BASIC METABOLIC PANEL
Anion gap: 11 (ref 5–15)
BUN: 34 mg/dL — AB (ref 6–20)
CALCIUM: 9 mg/dL (ref 8.9–10.3)
CO2: 35 mmol/L — ABNORMAL HIGH (ref 22–32)
Chloride: 94 mmol/L — ABNORMAL LOW (ref 101–111)
Creatinine, Ser: 1.59 mg/dL — ABNORMAL HIGH (ref 0.44–1.00)
GFR, EST AFRICAN AMERICAN: 37 mL/min — AB (ref >60)
GFR, EST NON AFRICAN AMERICAN: 32 mL/min — AB (ref >60)
Glucose, Bld: 151 mg/dL — ABNORMAL HIGH (ref 65–99)
Potassium: 2.4 mmol/L — CL (ref 3.5–5.1)
Sodium: 140 mmol/L (ref 135–145)

## 2014-10-28 LAB — TROPONIN I
TROPONIN I: 0.03 ng/mL (ref <0.031)
Troponin I: 0.03 ng/mL (ref <0.031)
Troponin I: <0.03 ng/mL (ref <0.031)

## 2014-10-28 LAB — CREATININE, SERUM
Creatinine, Ser: 2.2 mg/dL — ABNORMAL HIGH (ref 0.44–1.00)
GFR, EST AFRICAN AMERICAN: 25 mL/min — AB (ref >60)
GFR, EST NON AFRICAN AMERICAN: 22 mL/min — AB (ref >60)

## 2014-10-28 LAB — TSH: TSH: 0.504 u[IU]/mL (ref 0.350–4.500)

## 2014-10-28 LAB — MAGNESIUM: Magnesium: 2.2 mg/dL (ref 1.7–2.4)

## 2014-10-28 LAB — CBG MONITORING, ED: Glucose-Capillary: 94 mg/dL (ref 65–99)

## 2014-10-28 MED ORDER — ALPRAZOLAM 0.25 MG PO TABS
0.2500 mg | ORAL_TABLET | Freq: Three times a day (TID) | ORAL | Status: DC | PRN
  Administered 2014-10-28: 0.25 mg via ORAL
  Filled 2014-10-28 (×2): qty 1

## 2014-10-28 MED ORDER — HYDROCODONE-ACETAMINOPHEN 5-325 MG PO TABS
1.0000 | ORAL_TABLET | ORAL | Status: DC | PRN
  Administered 2014-10-28 – 2014-10-29 (×3): 1 via ORAL
  Filled 2014-10-28 (×3): qty 1

## 2014-10-28 MED ORDER — POTASSIUM CHLORIDE CRYS ER 20 MEQ PO TBCR
40.0000 meq | EXTENDED_RELEASE_TABLET | Freq: Four times a day (QID) | ORAL | Status: AC
  Administered 2014-10-28 – 2014-10-30 (×6): 40 meq via ORAL
  Filled 2014-10-28 (×7): qty 2

## 2014-10-28 MED ORDER — POTASSIUM CHLORIDE 10 MEQ/100ML IV SOLN
10.0000 meq | INTRAVENOUS | Status: AC
  Administered 2014-10-28 (×5): 10 meq via INTRAVENOUS
  Filled 2014-10-28 (×5): qty 100

## 2014-10-28 MED ORDER — TRAZODONE HCL 50 MG PO TABS
50.0000 mg | ORAL_TABLET | Freq: Every day | ORAL | Status: DC
  Administered 2014-10-28 – 2014-10-29 (×3): 50 mg via ORAL
  Filled 2014-10-28 (×5): qty 1

## 2014-10-28 MED ORDER — POTASSIUM CHLORIDE CRYS ER 20 MEQ PO TBCR
40.0000 meq | EXTENDED_RELEASE_TABLET | Freq: Four times a day (QID) | ORAL | Status: DC
  Administered 2014-10-28 (×2): 40 meq via ORAL
  Filled 2014-10-28 (×2): qty 2

## 2014-10-28 MED ORDER — HYDROMORPHONE HCL 1 MG/ML IJ SOLN: 1.0000 mg | INTRAMUSCULAR | Status: DC | PRN

## 2014-10-28 MED ORDER — FOLIC ACID 1 MG PO TABS
1.0000 mg | ORAL_TABLET | Freq: Every day | ORAL | Status: DC
  Administered 2014-10-28 – 2014-10-30 (×3): 1 mg via ORAL
  Filled 2014-10-28 (×3): qty 1

## 2014-10-28 MED ORDER — FLUVOXAMINE MALEATE 100 MG PO TABS
100.0000 mg | ORAL_TABLET | Freq: Every day | ORAL | Status: DC
  Administered 2014-10-28 – 2014-10-29 (×3): 100 mg via ORAL
  Filled 2014-10-28: qty 1
  Filled 2014-10-28 (×2): qty 2
  Filled 2014-10-28: qty 1

## 2014-10-28 MED ORDER — FENTANYL 50 MCG/HR TD PT72
50.0000 ug | MEDICATED_PATCH | TRANSDERMAL | Status: DC
  Administered 2014-10-28: 50 ug via TRANSDERMAL
  Filled 2014-10-28: qty 2

## 2014-10-28 MED ORDER — HEPARIN SODIUM (PORCINE) 5000 UNIT/ML IJ SOLN
5000.0000 [IU] | Freq: Three times a day (TID) | INTRAMUSCULAR | Status: DC
  Administered 2014-10-28 – 2014-10-30 (×6): 5000 [IU] via SUBCUTANEOUS
  Filled 2014-10-28 (×11): qty 1

## 2014-10-28 MED ORDER — FLUVOXAMINE MALEATE 50 MG PO TABS
50.0000 mg | ORAL_TABLET | Freq: Every day | ORAL | Status: DC
  Administered 2014-10-28 – 2014-10-30 (×3): 50 mg via ORAL
  Filled 2014-10-28 (×3): qty 1

## 2014-10-28 MED ORDER — ONDANSETRON HCL 4 MG/2ML IJ SOLN: 4.0000 mg | Freq: Four times a day (QID) | INTRAMUSCULAR | Status: DC | PRN

## 2014-10-28 MED ORDER — ONDANSETRON HCL 4 MG PO TABS: 4.0000 mg | ORAL_TABLET | Freq: Four times a day (QID) | ORAL | Status: DC | PRN

## 2014-10-28 MED ORDER — ACETAMINOPHEN 325 MG PO TABS: 650.0000 mg | ORAL_TABLET | Freq: Four times a day (QID) | ORAL | Status: DC | PRN

## 2014-10-28 MED ORDER — DOCUSATE SODIUM 100 MG PO CAPS
200.0000 mg | ORAL_CAPSULE | Freq: Every day | ORAL | Status: DC
  Administered 2014-10-28 – 2014-10-29 (×3): 200 mg via ORAL
  Filled 2014-10-28 (×5): qty 2

## 2014-10-28 MED ORDER — ADULT MULTIVITAMIN W/MINERALS CH
1.0000 | ORAL_TABLET | Freq: Every day | ORAL | Status: DC
  Administered 2014-10-28 – 2014-10-30 (×3): 1 via ORAL
  Filled 2014-10-28 (×3): qty 1

## 2014-10-28 MED ORDER — SODIUM CHLORIDE 0.9 % IV SOLN
INTRAVENOUS | Status: DC
  Administered 2014-10-28 – 2014-10-29 (×3): via INTRAVENOUS

## 2014-10-28 MED ORDER — HYDROMORPHONE HCL 1 MG/ML IJ SOLN: 0.5000 mg | INTRAMUSCULAR | Status: DC | PRN

## 2014-10-28 MED ORDER — ANASTROZOLE 1 MG PO TABS
1.0000 mg | ORAL_TABLET | Freq: Every day | ORAL | Status: DC
  Administered 2014-10-28 – 2014-10-29 (×3): 1 mg via ORAL
  Filled 2014-10-28 (×4): qty 1

## 2014-10-28 MED ORDER — BUPROPION HCL ER (XL) 150 MG PO TB24
150.0000 mg | ORAL_TABLET | Freq: Every day | ORAL | Status: DC
  Administered 2014-10-28 – 2014-10-30 (×3): 150 mg via ORAL
  Filled 2014-10-28 (×3): qty 1

## 2014-10-28 MED ORDER — ROPINIROLE HCL 0.5 MG PO TABS
0.5000 mg | ORAL_TABLET | Freq: Every evening | ORAL | Status: DC | PRN
  Filled 2014-10-28: qty 1

## 2014-10-28 MED ORDER — ACETAMINOPHEN 650 MG RE SUPP: 650.0000 mg | Freq: Four times a day (QID) | RECTAL | Status: DC | PRN

## 2014-10-28 MED ORDER — SODIUM CHLORIDE 0.9 % IJ SOLN
3.0000 mL | Freq: Two times a day (BID) | INTRAMUSCULAR | Status: DC
  Administered 2014-10-28 (×2): 3 mL via INTRAVENOUS

## 2014-10-28 MED ORDER — VITAMIN B-1 100 MG PO TABS
100.0000 mg | ORAL_TABLET | Freq: Every day | ORAL | Status: DC
  Administered 2014-10-28 – 2014-10-30 (×3): 100 mg via ORAL
  Filled 2014-10-28 (×3): qty 1

## 2014-10-28 NOTE — Progress Notes (Addendum)
CRITICAL VALUE ALERT  Critical value received:  Potassium  Date of notification:  2.4  Time of notification:  [id number]  Critical value read back: yes  Nurse who received alert:  Mannie Stabile RN  MD notified (1st page):    Time of first page:    MD notified (2nd page):  Time of second page:  Responding MD:   Time MD responded:

## 2014-10-28 NOTE — Progress Notes (Addendum)
Patient ID: [name], female   DOB: 1946/01/19, 69 y.o.   MRN: [name]  [facility] PROGRESS NOTE  [name] [id number] DOB: May 12, 1946 DOA: 10/27/2014 PCP: No PCP Per Patient   Brief narrative:    69 y.o. female with history of chronic pain, falls, CKD III, HTN, presents with main concern of persistent weakness and recurrent falls over one month in duration. She reports taking all of her medications as prescribed. She also explains she has been recently started on fentanyl but stopped taking it as it was not helping with her chronic back pain. She denies chest pain or shortness of breath, no specific abdominal or urinary concerns.  In emergency department, patient noted to be hemodynamically stable, initial blood pressure 89/41 and responded well to IV fluids, repeat blood pressure 121/58. Blood work notable for K2.2, Cr 2.2. [facility] asked to admit for further evaluation  Assessment/Plan:    Principal Problem:   Acute and severe Hypokalemia - In the setting of Lasix use, unclear if additional etiology contributing - Placed on K Dur 40 MEQ every 4 hours 4 doses - Repeat BMP in the morning Active Problems:   Elevated troponins - possibly secondary to hypokalemia - now within target range, pt denies chest pain  - monitor on telemetry    Frequent falls which generalized weakness - Possibly related to hypokalemia - Continue to supplement potassium, PT evaluation requested   HTN (hypertension) - Initial blood pressure 89/41, currently stable at 121/58 - Hold blood pressure medications until blood pressure stabilizes for 24 hours   CKD (chronic kidney disease), stage III - Cr on admission 2.2, IV fluids provided and creatinine is trending down - Repeat BMP in the morning   Acute on chronic lower back pain - PT evaluation requested, continue analgesia as needed   Anemia of chronic disease, IDA - No signs of active bleeding, repeat CBC in AM  DVT prophylaxis -  heparin SQ  Code Status: Full.  Family Communication:  plan of care discussed with the patient Disposition Plan: Home when stable.   IV access:  Peripheral IV  Procedures and diagnostic studies:    Dg Chest 2 View 10/27/2014    No active cardiopulmonary disease.     Ct Head Wo Contrast 10/27/2014   Chronic changes as described. No acute intracranial findings. Atrophy and small vessel disease. No recurrent subdural hematoma.    Medical Consultants:  None  Other Consultants:  PT  IAnti-Infectives:   None  Faye Ramsay, MD  [facility] Pager [phone]  If 7PM-7AM, please contact night-coverage [email or web address] Password [email or web address] 10/28/2014, 11:14 AM   HPI/Subjective: No events overnight.   Objective: Filed Vitals:   10/28/14 0735 10/28/14 0743 10/28/14 0959 10/28/14 1057  BP: 109/45  135/64 121/58  Pulse: 54  50 58  Temp:  97.4 F (36.3 C)  97.6 F (36.4 C)  TempSrc:  Oral  Oral  Resp: 15  18 16   SpO2: 99%  99% 100%   No intake or output data in the 24 hours ending 10/28/14 1114  Exam:   General:  Pt is alert, follows commands appropriately, not in acute distress  Cardiovascular: Regular rate and rhythm, no rubs, no gallops  Respiratory: Clear to auscultation bilaterally, no wheezing, no crackles, no rhonchi  Abdomen: Soft, non tender, non distended, bowel sounds present, no guarding  Extremities: No edema, pulses DP and PT palpable bilaterally  Neuro: Grossly nonfocal  Data Reviewed: Basic Metabolic Panel:  Recent Labs Lab 10/27/14 2033  10/28/14 0050 10/28/14 0700  NA 139  --  141  K <2.0*  --  2.2*  CL 91*  --  94*  CO2 37*  --  38*  GLUCOSE 101*  --  103*  BUN 40*  --  39*  CREATININE 1.94* 2.20* 1.80*  CALCIUM 9.9  --  9.2  MG  --  2.2  --    Liver Function Tests:  Recent Labs Lab 10/27/14 2033 10/28/14 0700  AST 45* 37  ALT 30 26  ALKPHOS 78 67  BILITOT 0.8 0.7  PROT 6.9 5.8*  ALBUMIN 4.0 3.3*    Recent Labs Lab 10/27/14 2033   AMMONIA 24   CBC:  Recent Labs Lab 10/27/14 2033 10/28/14 0050 10/28/14 0700  WBC 5.1 4.8 4.2  NEUTROABS 2.9  --   --   HGB 11.3* 10.9* 10.3*  HCT 35.3* 33.4* 32.8*  MCV 84.9 85.2 85.9  PLT 225 224 197   Cardiac Enzymes:  Recent Labs Lab 10/27/14 2033 10/28/14 0050 10/28/14 0701  TROPONINI 0.04* 0.03 0.03   CBG:  Recent Labs Lab 10/28/14 0740  GLUCAP 94    Scheduled Meds: . anastrozole  1 mg Oral QHS  . buPROPion  150 mg Oral Daily  . docusate sodium  200 mg Oral QHS  . fentaNYL  50 mcg Transdermal Q72H  . fluvoxaMINE  100 mg Oral QHS  . fluvoxaMINE  50 mg Oral Daily  . folic acid  1 mg Oral Daily  . heparin  5,000 Units Subcutaneous 3 times per day  . multivitamin with minerals  1 tablet Oral Daily  . sodium chloride  3 mL Intravenous Q12H  . thiamine  100 mg Oral Daily  . traZODone  50 mg Oral QHS   Continuous Infusions: . sodium chloride 50 mL/hr at 10/28/14 0240

## 2014-10-29 DIAGNOSIS — E876 Hypokalemia: Secondary | ICD-10-CM | POA: Diagnosis not present

## 2014-10-29 LAB — GLUCOSE, CAPILLARY: GLUCOSE-CAPILLARY: 81 mg/dL (ref 65–99)

## 2014-10-29 LAB — BASIC METABOLIC PANEL
ANION GAP: 7 (ref 5–15)
BUN: 30 mg/dL — AB (ref 6–20)
CO2: 34 mmol/L — ABNORMAL HIGH (ref 22–32)
CREATININE: 1.39 mg/dL — AB (ref 0.44–1.00)
Calcium: 9.6 mg/dL (ref 8.9–10.3)
Chloride: 102 mmol/L (ref 101–111)
GFR calc Af Amer: 44 mL/min — ABNORMAL LOW (ref >60)
GFR calc non Af Amer: 38 mL/min — ABNORMAL LOW (ref >60)
GLUCOSE: 109 mg/dL — AB (ref 65–99)
Potassium: 3.3 mmol/L — ABNORMAL LOW (ref 3.5–5.1)
Sodium: 143 mmol/L (ref 135–145)

## 2014-10-29 LAB — HEMOGLOBIN A1C
Hgb A1c MFr Bld: 6.1 % — ABNORMAL HIGH (ref 4.8–5.6)
Mean Plasma Glucose: 128 mg/dL

## 2014-10-29 NOTE — Progress Notes (Signed)
Patient ID: [name], female   DOB: [birth year], 69 y.o.   MRN: [name]  [facility] PROGRESS NOTE  [name] [id number] DOB: August 07, 1945 DOA: 10/27/2014 PCP: No PCP Per Patient   Brief narrative:    69 y.o. female with history of chronic pain, falls, CKD III, HTN, presents with main concern of persistent weakness and recurrent falls over one month in duration. She reports taking all of her medications as prescribed. She also explains she has been recently started on fentanyl but stopped taking it as it was not helping with her chronic back pain. She denies chest pain or shortness of breath, no specific abdominal or urinary concerns.  In emergency department, patient noted to be hemodynamically stable, initial blood pressure 89/41 and responded well to IV fluids, repeat blood pressure 121/58. Blood work notable for K2.2, Cr 2.2. [facility] asked to admit for further evaluation  Assessment/Plan:    Principal Problem:   Acute and severe Hypokalemia - In the setting of Lasix use, unclear if additional etiology contributing - Placed on K Dur 40 MEQ every 4 hours 4 doses - Repeat BMP pending this AM - continue to supplement as indicated  Active Problems:   Elevated troponins - possibly secondary to hypokalemia - now within target range, pt denies chest pain  - monitor on telemetry    Frequent falls which generalized weakness - Possibly related to hypokalemia - Continue to supplement potassium, PT evaluation requested   HTN (hypertension) - Initial blood pressure 89/41, currently stable at 121/58 - continue to hold blood pressure medications until blood pressure stabilizes for 24 hours   CKD (chronic kidney disease), stage III - Cr on admission 2.2, IV fluids provided and creatinine is trending down - Repeat BMP pending    Acute on chronic lower back pain - PT evaluation requested, continue analgesia as needed   Anemia of chronic disease, IDA - No signs of active  bleeding, repeat CBC in AM  DVT prophylaxis - heparin SQ  Code Status: Full.  Family Communication:  plan of care discussed with the patient Disposition Plan: Home when stable.   IV access:  Peripheral IV  Procedures and diagnostic studies:    Dg Chest 2 View 10/27/2014    No active cardiopulmonary disease.     Ct Head Wo Contrast 10/27/2014   Chronic changes as described. No acute intracranial findings. Atrophy and small vessel disease. No recurrent subdural hematoma.    Medical Consultants:  None  Other Consultants:  PT  IAnti-Infectives:   None  Faye Ramsay, MD  [facility] Pager [phone]  If 7PM-7AM, please contact night-coverage [email or web address] Password [email or web address] 10/29/2014, 7:03 AM   HPI/Subjective: No events overnight.   Objective: Filed Vitals:   10/28/14 1100 10/28/14 1528 10/28/14 2115 10/29/14 0532  BP:  104/52 142/67 139/62  Pulse:  58 66 56  Temp:  97.8 F (36.6 C) 98.2 F (36.8 C) 98.5 F (36.9 C)  TempSrc:  Oral Oral Oral  Resp:  18 16 16   Height: 5\' 3"  (1.6 m)     Weight: 55.792 kg (123 lb)     SpO2:  95% 97% 99%    Intake/Output Summary (Last 24 hours) at 10/29/14 0703 Last data filed at 10/29/14 0600  Gross per 24 hour  Intake    984 ml  Output      2 ml  Net    982 ml    Exam:   General:  Pt is alert, follows commands appropriately, not in  acute distress  Cardiovascular: Regular rate and rhythm, no rubs, no gallops  Respiratory: Clear to auscultation bilaterally, no wheezing, no crackles, no rhonchi  Abdomen: Soft, non tender, non distended, bowel sounds present, no guarding  Extremities: No edema, pulses DP and PT palpable bilaterally  Neuro: Grossly nonfocal  Data Reviewed: Basic Metabolic Panel:  Recent Labs Lab 10/27/14 2033 10/28/14 0050 10/28/14 0700 10/28/14 1234  NA 139  --  141 140  K <2.0*  --  2.2* 2.4*  CL 91*  --  94* 94*  CO2 37*  --  38* 35*  GLUCOSE 101*  --  103* 151*  BUN 40*  --  39* 34*  CREATININE  1.94* 2.20* 1.80* 1.59*  CALCIUM 9.9  --  9.2 9.0  MG  --  2.2  --   --    Liver Function Tests:  Recent Labs Lab 10/27/14 2033 10/28/14 0700  AST 45* 37  ALT 30 26  ALKPHOS 78 67  BILITOT 0.8 0.7  PROT 6.9 5.8*  ALBUMIN 4.0 3.3*    Recent Labs Lab 10/27/14 2033  AMMONIA 24   CBC:  Recent Labs Lab 10/27/14 2033 10/28/14 0050 10/28/14 0700  WBC 5.1 4.8 4.2  NEUTROABS 2.9  --   --   HGB 11.3* 10.9* 10.3*  HCT 35.3* 33.4* 32.8*  MCV 84.9 85.2 85.9  PLT 225 224 197   Cardiac Enzymes:  Recent Labs Lab 10/27/14 2033 10/28/14 0050 10/28/14 0701 10/28/14 1234  TROPONINI 0.04* 0.03 0.03 <0.03   CBG:  Recent Labs Lab 10/28/14 0740  GLUCAP 94    Scheduled Meds: . anastrozole  1 mg Oral QHS  . buPROPion  150 mg Oral Daily  . docusate sodium  200 mg Oral QHS  . fluvoxaMINE  100 mg Oral QHS  . fluvoxaMINE  50 mg Oral Daily  . folic acid  1 mg Oral Daily  . heparin  5,000 Units Subcutaneous 3 times per day  . multivitamin with minerals  1 tablet Oral Daily  . potassium chloride  40 mEq Oral QID  . sodium chloride  3 mL Intravenous Q12H  . thiamine  100 mg Oral Daily  . traZODone  50 mg Oral QHS   Continuous Infusions: . sodium chloride 50 mL/hr at 10/28/14 1507

## 2014-10-29 NOTE — Evaluation (Signed)
Physical Therapy Evaluation Patient Details Name: [name] MRN: [name] DOB: [birth year] Today's Date: 10/29/2014   History of Present Illness  69 y.o. female with history of chronic pain, falls, CKD III, HTN, presents 6/67/16 with main concern of persistent weakness and recurrent falls over one month in duration. Found to have hypo and low BP.  Clinical Impression  Patient tolerated ambulation, noted with mild balance deficits. Encouraged patient to use RW , patient  Will benefit from OPT to address problems listed in note below.    Follow Up Recommendations Home health PT;Supervision/Assistance - 24 hour, patient declines SNF,    Equipment Recommendations  Cane;Other (comment) (another grab bar beside tub)    Recommendations for Other Services OT consult     Precautions / Restrictions Precautions Precautions: Fall      Mobility  Bed Mobility Overal bed mobility: Independent                Transfers Overall transfer level: Modified independent                  Ambulation/Gait Ambulation/Gait assistance: Min assist;Min guard Ambulation Distance (Feet): 260 Feet Assistive device: 1 person hand held assist Gait Pattern/deviations: Step-through pattern;Drifts right/left     General Gait Details: intermittent hand hold, patient holds onto objects, rail  Stairs            Wheelchair Mobility    Modified Rankin (Stroke Patients Only)       Balance Overall balance assessment: History of Falls;Needs assistance Sitting-balance support: No upper extremity supported;Feet supported Sitting balance-Leahy Scale: Good     Standing balance support: No upper extremity supported;During functional activity Standing balance-Leahy Scale: Poor                               Pertinent Vitals/Pain Pain Assessment: 0-10 Pain Score: 5  Pain Location: back and neck Pain Descriptors / Indicators: Discomfort;Aching Pain Intervention(s):  Monitored during session    Home Living Family/patient expects to be discharged to:: Private residence Living Arrangements: Alone Available Help at Discharge: Family;Friend(s);Available PRN/intermittently Type of Home: House Home Access: Stairs to enter Entrance Stairs-Rails: Left Entrance Stairs-Number of Steps: 7 Home Layout: One level Home Equipment: Walker - 2 wheels;Grab bars - tub/shower      Prior Function Level of Independence: Independent               Hand Dominance        Extremity/Trunk Assessment   Upper Extremity Assessment: RUE deficits/detail;LUE deficits/detail RUE Deficits / Details: shoulder elevation to about 80,, bilateral  total shoulder replacementas     LUE Deficits / Details:  90 elevation of shoulder   Lower Extremity Assessment: RLE deficits/detail;LLE deficits/detail RLE Deficits / Details: reports tingling in  dosal foot and heel, pain over tibia to touch LLE Deficits / Details: same as R  Cervical / Trunk Assessment: Kyphotic;Other exceptions  Communication   Communication: No difficulties  Cognition Arousal/Alertness: Awake/alert Behavior During Therapy: WFL for tasks assessed/performed Overall Cognitive Status: Within Functional Limits for tasks assessed                      General Comments      Exercises Other Exercises Other Exercises: seated shoulder elevation with  horizontal ABD, stand against wall with back straight, chin tilt, head back.      Assessment/Plan    PT Assessment Patient needs continued PT services  PT Diagnosis Difficulty walking;Generalized weakness   PT Problem List Decreased strength;Decreased range of motion;Decreased activity tolerance;Decreased balance;Decreased mobility;Decreased safety awareness;Decreased knowledge of use of DME;Decreased knowledge of precautions;Pain  PT Treatment Interventions DME instruction;Gait training;Stair training;Functional mobility training;Therapeutic  activities;Therapeutic exercise;Patient/family education   PT Goals (Current goals can be found in the Care Plan section) Acute Rehab PT Goals Patient Stated Goal: tp go home to my dogs PT Goal Formulation: With patient Time For Goal Achievement: 11/12/14 Potential to Achieve Goals: Good    Frequency Min 3X/week   Barriers to discharge        Co-evaluation               End of Session Equipment Utilized During Treatment: Gait belt Activity Tolerance: Patient tolerated treatment well Patient left: in bed;with call bell/phone within reach Nurse Communication: Mobility status    Functional Assessment Tool Used: clinical judgement Functional Limitation: Mobility: Walking and moving around Mobility: Walking and Moving Around Current Status [id number]): At least 20 percent but less than 40 percent impaired, limited or restricted Mobility: Walking and Moving Around Goal Status [phone]): 0 percent impaired, limited or restricted    Time: 1316-1341 PT Time Calculation (min) (ACUTE ONLY): 25 min   Charges:   PT Evaluation $Initial PT Evaluation Tier I: 1 Procedure PT Treatments $Gait Training: 8-22 mins   PT G Codes:   PT G-Codes **NOT FOR INPATIENT CLASS** Functional Assessment Tool Used: clinical judgement Functional Limitation: Mobility: Walking and moving around Mobility: Walking and Moving Around Current Status [id number]): At least 20 percent but less than 40 percent impaired, limited or restricted Mobility: Walking and Moving Around Goal Status [phone]): 0 percent impaired, limited or restricted    Claretha Cooper 10/29/2014, 2:03 PM

## 2014-10-30 DIAGNOSIS — E876 Hypokalemia: Secondary | ICD-10-CM | POA: Diagnosis not present

## 2014-10-30 LAB — BASIC METABOLIC PANEL
ANION GAP: 5 (ref 5–15)
BUN: 27 mg/dL — AB (ref 6–20)
CO2: 30 mmol/L (ref 22–32)
CREATININE: 1.19 mg/dL — AB (ref 0.44–1.00)
Calcium: 9.4 mg/dL (ref 8.9–10.3)
Chloride: 108 mmol/L (ref 101–111)
GFR calc Af Amer: 53 mL/min — ABNORMAL LOW (ref >60)
GFR calc non Af Amer: 46 mL/min — ABNORMAL LOW (ref >60)
Glucose, Bld: 87 mg/dL (ref 65–99)
POTASSIUM: 4.6 mmol/L (ref 3.5–5.1)
SODIUM: 143 mmol/L (ref 135–145)

## 2014-10-30 LAB — CBC
HCT: 32.1 % — ABNORMAL LOW (ref 36.0–46.0)
Hemoglobin: 10 g/dL — ABNORMAL LOW (ref 12.0–15.0)
MCH: 27.2 pg (ref 26.0–34.0)
MCHC: 31.2 g/dL (ref 30.0–36.0)
MCV: 87.5 fL (ref 78.0–100.0)
Platelets: 216 10*3/uL (ref 150–400)
RBC: 3.67 MIL/uL — AB (ref 3.87–5.11)
RDW: 13.2 % (ref 11.5–15.5)
WBC: 3.9 10*3/uL — AB (ref 4.0–10.5)

## 2014-10-30 LAB — GLUCOSE, CAPILLARY: Glucose-Capillary: 92 mg/dL (ref 65–99)

## 2014-10-30 MED ORDER — POTASSIUM CHLORIDE CRYS ER 20 MEQ PO TBCR: 20.0000 meq | EXTENDED_RELEASE_TABLET | Freq: Four times a day (QID) | ORAL | Status: DC

## 2014-10-30 MED ORDER — HYDROCODONE-ACETAMINOPHEN 5-325 MG PO TABS: 1.0000 | ORAL_TABLET | ORAL | Status: DC | PRN

## 2014-10-30 MED ORDER — POTASSIUM CHLORIDE CRYS ER 20 MEQ PO TBCR: 20.0000 meq | EXTENDED_RELEASE_TABLET | Freq: Every day | ORAL | Status: DC

## 2014-10-30 MED ORDER — HYDRALAZINE HCL 10 MG PO TABS: 10.0000 mg | ORAL_TABLET | Freq: Three times a day (TID) | ORAL | Status: DC

## 2014-10-30 MED ORDER — ALPRAZOLAM 0.5 MG PO TABS: 0.2500 mg | ORAL_TABLET | Freq: Three times a day (TID) | ORAL | Status: DC | PRN

## 2014-10-30 NOTE — Progress Notes (Signed)
Pt leaving at this time with her "ride". Discharge instructions/prescriptions given/explained with pt verbalizing understanding. Pt refused to take prescriptions for Xanax and Vicodin. Pt states she already has Xanax at home and Vicodin does "nothing for me". Pt left with cell phone, clothes, and glasses.

## 2014-10-30 NOTE — Discharge Instructions (Signed)

## 2014-10-30 NOTE — Discharge Summary (Signed)
Physician Discharge Summary  [name] [id number] DOB: 01/25/1946 DOA: 10/27/2014  PCP: No PCP Per Patient  Admit date: 10/27/2014 Discharge date: 10/30/2014  Recommendations for Outpatient Follow-up:  1. Pt will need to follow up with PCP in 2-3 weeks post discharge 2. Please obtain BMP to evaluate electrolytes and kidney function 3. Please also check CBC to evaluate Hg and Hct levels 4. Please note that Chlorthalidone was stopped and pt placed on hydralazine for BP control instead   Discharge Diagnoses:  Principal Problem:   Hypokalemia Active Problems:   Falls frequently   HTN (hypertension)   CKD (chronic kidney disease), stage III   Chronic pain  Discharge Condition: Stable  Diet recommendation: Heart healthy diet discussed in details    Brief narrative:    69 y.o. female with history of chronic pain, falls, CKD III, HTN, presents with main concern of persistent weakness and recurrent falls over one month in duration. She reports taking all of her medications as prescribed. She also explains she has been recently started on fentanyl but stopped taking it as it was not helping with her chronic back pain. She denies chest pain or shortness of breath, no specific abdominal or urinary concerns.  In emergency department, patient noted to be hemodynamically stable, initial blood pressure 89/41 and responded well to IV fluids, repeat blood pressure 121/58. Blood work notable for K2.2, Cr 2.2. [facility] asked to admit for further evaluation  Assessment/Plan:    Principal Problem:  Acute and severe Hypokalemia - In the setting of Lasix use, unclear if additional etiology contributing - Placed on K Dur 40 MEQ every 4 hours 4 doses - K is now WNL, continue low dose supplementation at home as noted below Active Problems:  Elevated troponins - possibly secondary to hypokalemia - now within target range, pt denies chest pain  - no events on telemetry   Frequent falls  which generalized weakness - Possibly related to hypokalemia - Continue to supplement potassium, PT evaluation requested - pt wants to go home today  - HH PT requested   HTN (hypertension) - reasonable inpatient control  - please note that chlorthalidone stopped and pt placed on hydralazine instead   CKD (chronic kidney disease), stage III - Cr on admission 2.2, IV fluids provided and creatinine is trending down  Acute on chronic lower back pain - PT evaluation requested, continue analgesia as needed  Anemia of chronic disease, IDA - No signs of active bleeding  Code Status: Full.  Family Communication: plan of care discussed with the patient Disposition Plan: Home   IV access:  Peripheral IV  Procedures and diagnostic studies:   Dg Chest 2 View 10/27/2014 No active cardiopulmonary disease.   Ct Head Wo Contrast 10/27/2014 Chronic changes as described. No acute intracranial findings. Atrophy and small vessel disease. No recurrent subdural hematoma.   Medical Consultants:  None  Other Consultants:  PT  IAnti-Infectives:   None       Discharge Exam: Filed Vitals:   10/30/14 0558  BP: 142/50  Pulse: 61  Temp: 98.1 F (36.7 C)  Resp: 18   Filed Vitals:   10/29/14 0532 10/29/14 1420 10/29/14 2212 10/30/14 0558  BP: 139/62 109/55 159/68 142/50  Pulse: 56 58 71 61  Temp: 98.5 F (36.9 C) 98.2 F (36.8 C) 98.2 F (36.8 C) 98.1 F (36.7 C)  TempSrc: Oral Oral Oral Oral  Resp: 16 16 20 18   Height:      Weight:  SpO2: 99% 96% 98% 96%    General: Pt is alert, follows commands appropriately, not in acute distress Cardiovascular: Regular rate and rhythm, S1/S2 +, no murmurs, no rubs, no gallops Respiratory: Clear to auscultation bilaterally, no wheezing, no crackles, no rhonchi Abdominal: Soft, non tender, non distended, bowel sounds +, no guarding Extremities: no edema, no cyanosis, pulses palpable bilaterally DP and PT Neuro:  Grossly nonfocal  Discharge Instructions  Discharge Instructions    Diet - low sodium heart healthy    Complete by:  As directed      Increase activity slowly    Complete by:  As directed             Medication List    STOP taking these medications        chlorthalidone 25 MG tablet  Commonly known as:  HYGROTON     OxyCODONE 40 mg T12a 12 hr tablet  Commonly known as:  OXYCONTIN     Potassium Gluconate 595 MG Caps      TAKE these medications        ALPRAZolam 0.5 MG tablet  Commonly known as:  XANAX  Take 0.5 tablets (0.25 mg total) by mouth 3 (three) times daily as needed for anxiety.     anastrozole 1 MG tablet  Commonly known as:  ARIMIDEX  Take 1 tablet (1 mg total) by mouth at bedtime.     buPROPion 150 MG 24 hr tablet  Commonly known as:  WELLBUTRIN XL  Take 150 mg by mouth daily.     docusate sodium 100 MG capsule  Commonly known as:  COLACE  Take 200 mg by mouth at bedtime.     fentaNYL 50 MCG/HR  Commonly known as:  DURAGESIC - dosed mcg/hr  1 patch every 3 (three) days.     fluvoxaMINE 50 MG tablet  Commonly known as:  LUVOX  Take 50-100 mg by mouth 2 (two) times daily. She takes one tablet in the morning and two tablets in the evening.     furosemide 20 MG tablet  Commonly known as:  LASIX  Take 20 mg by mouth daily.     hydrALAZINE 10 MG tablet  Commonly known as:  APRESOLINE  Take 1 tablet (10 mg total) by mouth 3 (three) times daily.     HYDROcodone-acetaminophen 5-325 MG per tablet  Commonly known as:  NORCO/VICODIN  Take 1 tablet by mouth every 4 (four) hours as needed for moderate pain.     potassium chloride SA 20 MEQ tablet  Commonly known as:  K-DUR,KLOR-CON  Take 1 tablet (20 mEq total) by mouth daily.     rOPINIRole 0.5 MG tablet  Commonly known as:  REQUIP  Take 0.5 mg by mouth at bedtime as needed ("restlessness").     traZODone 50 MG tablet  Commonly known as:  DESYREL  Take 1 tablet by mouth at bedtime.              Follow-up Information    Call Faye Ramsay, MD.   Specialty:  Internal Medicine   Why:  As needed call my cell phone [phone]   Contact information:   [address] [place] [address] [phone]        The results of significant diagnostics from this hospitalization (including imaging, microbiology, ancillary and laboratory) are listed below for reference.     Microbiology: No results found for this or any previous visit (from the past 240 hour(s)).   Labs:  Basic Metabolic Panel:  Recent Labs Lab 10/27/14 2033 10/28/14 0050 10/28/14 0700 10/28/14 1234 10/29/14 0850 10/30/14 0535  NA 139  --  141 140 143 143  K <2.0*  --  2.2* 2.4* 3.3* 4.6  CL 91*  --  94* 94* 102 108  CO2 37*  --  38* 35* 34* 30  GLUCOSE 101*  --  103* 151* 109* 87  BUN 40*  --  39* 34* 30* 27*  CREATININE 1.94* 2.20* 1.80* 1.59* 1.39* 1.19*  CALCIUM 9.9  --  9.2 9.0 9.6 9.4  MG  --  2.2  --   --   --   --    Liver Function Tests:  Recent Labs Lab 10/27/14 2033 10/28/14 0700  AST 45* 37  ALT 30 26  ALKPHOS 78 67  BILITOT 0.8 0.7  PROT 6.9 5.8*  ALBUMIN 4.0 3.3*    Recent Labs Lab 10/27/14 2033  AMMONIA 24   CBC:  Recent Labs Lab 10/27/14 2033 10/28/14 0050 10/28/14 0700 10/30/14 0535  WBC 5.1 4.8 4.2 3.9*  NEUTROABS 2.9  --   --   --   HGB 11.3* 10.9* 10.3* 10.0*  HCT 35.3* 33.4* 32.8* 32.1*  MCV 84.9 85.2 85.9 87.5  PLT 225 224 197 216   Cardiac Enzymes:  Recent Labs Lab 10/27/14 2033 10/28/14 0050 10/28/14 0701 10/28/14 1234  TROPONINI 0.04* 0.03 0.03 <0.03   CBG:  Recent Labs Lab 10/28/14 0740 10/29/14 0725 10/30/14 0737  GLUCAP 94 81 92     SIGNED: Time coordinating discharge: 30 minutes  MAGICK-Cashawn Yanko, MD  [facility] 10/30/2014, 9:34 AM Pager [phone]  If 7PM-7AM, please contact night-coverage [email or web address] Password [email or web address]

## 2014-10-30 NOTE — Care Management Note (Signed)
Case Management Note  Patient Details  Name: [name] MRN: [name] Date of Birth: 1945/06/14  Subjective/Objective:     69 yo female admitted with hypokalemia               Action/Plan: Patient stated that she has a rolling walker at home but not a cane. Patient received order for cane and understands that she can take the order form to a pharmacy or [facility] store to get cane and will have to pay a portion of the cost. Patient chose [facility] for [facility] needs and [facility] rep, Lelan Pons, has been notified of referral. Patient contact information and address has been verified. PCP is Dr. Lavina Hamman.  Expected Discharge Date:   (unknown)               Expected Discharge Plan:  [place]  In-House Referral:     Discharge planning Services     Post Acute Care Choice:  Home Health Choice offered to:  Patient  DME Arranged:  Kasandra Knudsen DME Agency:     HH Arranged:  RN, PT, Nurse's Aide Agency [place] Agency:  [place]  Status of Service:  Completed, signed off  Medicare Important Message Given:  No Date Medicare IM Given:    Medicare IM give by:    Date Additional Medicare IM Given:    Additional Medicare Important Message give by:     If discussed at [place] of Stay Meetings, dates discussed:    Additional Comments:  Scot Dock, RN 10/30/2014, 10:12 AM

## 2014-11-04 ENCOUNTER — Encounter (HOSPITAL_COMMUNITY): Payer: Self-pay

## 2014-11-04 ENCOUNTER — Inpatient Hospital Stay (HOSPITAL_COMMUNITY)
Admission: EM | Admit: 2014-11-04 | Discharge: 2014-11-05 | DRG: 918 | Disposition: A | Discharge status: Home Health | Payer: Medicare Other | Attending: Internal Medicine | Admitting: Internal Medicine

## 2014-11-04 DIAGNOSIS — Z853 Personal history of malignant neoplasm of breast: Secondary | ICD-10-CM

## 2014-11-04 DIAGNOSIS — Z87891 Personal history of nicotine dependence: Secondary | ICD-10-CM

## 2014-11-04 DIAGNOSIS — N183 Chronic kidney disease, stage 3 unspecified: Secondary | ICD-10-CM | POA: Diagnosis present

## 2014-11-04 DIAGNOSIS — R05 Cough: Secondary | ICD-10-CM | POA: Diagnosis present

## 2014-11-04 DIAGNOSIS — Z79891 Long term (current) use of opiate analgesic: Secondary | ICD-10-CM

## 2014-11-04 DIAGNOSIS — Z79899 Other long term (current) drug therapy: Secondary | ICD-10-CM

## 2014-11-04 DIAGNOSIS — I10 Essential (primary) hypertension: Secondary | ICD-10-CM | POA: Diagnosis present

## 2014-11-04 DIAGNOSIS — E876 Hypokalemia: Secondary | ICD-10-CM | POA: Diagnosis present

## 2014-11-04 DIAGNOSIS — R531 Weakness: Secondary | ICD-10-CM | POA: Diagnosis not present

## 2014-11-04 DIAGNOSIS — T40691A Poisoning by other narcotics, accidental (unintentional), initial encounter: Principal | ICD-10-CM | POA: Diagnosis present

## 2014-11-04 DIAGNOSIS — R55 Syncope and collapse: Secondary | ICD-10-CM | POA: Diagnosis present

## 2014-11-04 DIAGNOSIS — K922 Gastrointestinal hemorrhage, unspecified: Secondary | ICD-10-CM | POA: Diagnosis present

## 2014-11-04 DIAGNOSIS — C50911 Malignant neoplasm of unspecified site of right female breast: Secondary | ICD-10-CM | POA: Diagnosis present

## 2014-11-04 DIAGNOSIS — I129 Hypertensive chronic kidney disease with stage 1 through stage 4 chronic kidney disease, or unspecified chronic kidney disease: Secondary | ICD-10-CM | POA: Diagnosis present

## 2014-11-04 DIAGNOSIS — F1120 Opioid dependence, uncomplicated: Secondary | ICD-10-CM | POA: Insufficient documentation

## 2014-11-04 DIAGNOSIS — F329 Major depressive disorder, single episode, unspecified: Secondary | ICD-10-CM | POA: Diagnosis present

## 2014-11-04 DIAGNOSIS — R296 Repeated falls: Secondary | ICD-10-CM

## 2014-11-04 DIAGNOSIS — N184 Chronic kidney disease, stage 4 (severe): Secondary | ICD-10-CM | POA: Diagnosis present

## 2014-11-04 DIAGNOSIS — N179 Acute kidney failure, unspecified: Secondary | ICD-10-CM | POA: Diagnosis present

## 2014-11-04 DIAGNOSIS — Z85038 Personal history of other malignant neoplasm of large intestine: Secondary | ICD-10-CM

## 2014-11-04 HISTORY — DX: Gastrointestinal hemorrhage, unspecified: K92.2

## 2014-11-04 HISTORY — DX: Chronic kidney disease, stage 3 (moderate): N18.3

## 2014-11-04 HISTORY — DX: Traumatic subdural hemorrhage with loss of consciousness status unknown, initial encounter: S06.5XAA

## 2014-11-04 HISTORY — DX: Chronic kidney disease, stage 3 unspecified: N18.30

## 2014-11-04 HISTORY — DX: Traumatic subdural hemorrhage with loss of consciousness of unspecified duration, initial encounter: S06.5X9A

## 2014-11-04 LAB — CBC
HEMATOCRIT: 34 % — AB (ref 36.0–46.0)
HEMOGLOBIN: 10.6 g/dL — AB (ref 12.0–15.0)
MCH: 27 pg (ref 26.0–34.0)
MCHC: 31.2 g/dL (ref 30.0–36.0)
MCV: 86.5 fL (ref 78.0–100.0)
Platelets: 256 10*3/uL (ref 150–400)
RBC: 3.93 MIL/uL (ref 3.87–5.11)
RDW: 13.4 % (ref 11.5–15.5)
WBC: 4.7 10*3/uL (ref 4.0–10.5)

## 2014-11-04 LAB — BASIC METABOLIC PANEL
ANION GAP: 9 (ref 5–15)
BUN: 39 mg/dL — ABNORMAL HIGH (ref 6–20)
CHLORIDE: 106 mmol/L (ref 101–111)
CO2: 25 mmol/L (ref 22–32)
CREATININE: 1.44 mg/dL — AB (ref 0.44–1.00)
Calcium: 9.6 mg/dL (ref 8.9–10.3)
GFR calc Af Amer: 42 mL/min — ABNORMAL LOW (ref >60)
GFR calc non Af Amer: 36 mL/min — ABNORMAL LOW (ref >60)
Glucose, Bld: 96 mg/dL (ref 65–99)
Potassium: 4.7 mmol/L (ref 3.5–5.1)
Sodium: 140 mmol/L (ref 135–145)

## 2014-11-04 NOTE — ED Notes (Signed)
Patient reports feeling extremely weak, similar to how she felt last week when she was hypokalemic.

## 2014-11-05 ENCOUNTER — Emergency Department (HOSPITAL_COMMUNITY): Payer: Medicare Other

## 2014-11-05 ENCOUNTER — Ambulatory Visit (HOSPITAL_COMMUNITY): Admit: 2014-11-05 | Payer: Medicare Other

## 2014-11-05 ENCOUNTER — Encounter (HOSPITAL_COMMUNITY): Payer: Self-pay | Admitting: Internal Medicine

## 2014-11-05 DIAGNOSIS — R55 Syncope and collapse: Secondary | ICD-10-CM

## 2014-11-05 DIAGNOSIS — C50919 Malignant neoplasm of unspecified site of unspecified female breast: Secondary | ICD-10-CM

## 2014-11-05 DIAGNOSIS — I10 Essential (primary) hypertension: Secondary | ICD-10-CM | POA: Diagnosis not present

## 2014-11-05 DIAGNOSIS — N184 Chronic kidney disease, stage 4 (severe): Secondary | ICD-10-CM | POA: Diagnosis not present

## 2014-11-05 DIAGNOSIS — Z85038 Personal history of other malignant neoplasm of large intestine: Secondary | ICD-10-CM | POA: Diagnosis not present

## 2014-11-05 DIAGNOSIS — R296 Repeated falls: Secondary | ICD-10-CM | POA: Diagnosis not present

## 2014-11-05 DIAGNOSIS — F1120 Opioid dependence, uncomplicated: Secondary | ICD-10-CM | POA: Insufficient documentation

## 2014-11-05 DIAGNOSIS — Z853 Personal history of malignant neoplasm of breast: Secondary | ICD-10-CM | POA: Diagnosis not present

## 2014-11-05 DIAGNOSIS — N183 Chronic kidney disease, stage 3 (moderate): Secondary | ICD-10-CM

## 2014-11-05 DIAGNOSIS — Z87891 Personal history of nicotine dependence: Secondary | ICD-10-CM | POA: Diagnosis not present

## 2014-11-05 DIAGNOSIS — R531 Weakness: Secondary | ICD-10-CM

## 2014-11-05 DIAGNOSIS — N179 Acute kidney failure, unspecified: Secondary | ICD-10-CM

## 2014-11-05 DIAGNOSIS — R05 Cough: Secondary | ICD-10-CM | POA: Diagnosis not present

## 2014-11-05 DIAGNOSIS — Z79891 Long term (current) use of opiate analgesic: Secondary | ICD-10-CM | POA: Diagnosis not present

## 2014-11-05 DIAGNOSIS — F1920 Other psychoactive substance dependence, uncomplicated: Secondary | ICD-10-CM

## 2014-11-05 DIAGNOSIS — E876 Hypokalemia: Secondary | ICD-10-CM | POA: Diagnosis present

## 2014-11-05 DIAGNOSIS — Z79899 Other long term (current) drug therapy: Secondary | ICD-10-CM | POA: Diagnosis not present

## 2014-11-05 DIAGNOSIS — F329 Major depressive disorder, single episode, unspecified: Secondary | ICD-10-CM | POA: Diagnosis present

## 2014-11-05 DIAGNOSIS — I129 Hypertensive chronic kidney disease with stage 1 through stage 4 chronic kidney disease, or unspecified chronic kidney disease: Secondary | ICD-10-CM | POA: Diagnosis not present

## 2014-11-05 DIAGNOSIS — T40691A Poisoning by other narcotics, accidental (unintentional), initial encounter: Secondary | ICD-10-CM | POA: Diagnosis not present

## 2014-11-05 LAB — COMPREHENSIVE METABOLIC PANEL
ALT: 29 U/L (ref 14–54)
ANION GAP: 8 (ref 5–15)
AST: 39 U/L (ref 15–41)
Albumin: 3.7 g/dL (ref 3.5–5.0)
Alkaline Phosphatase: 83 U/L (ref 38–126)
BILIRUBIN TOTAL: 0.5 mg/dL (ref 0.3–1.2)
BUN: 37 mg/dL — AB (ref 6–20)
CO2: 25 mmol/L (ref 22–32)
CREATININE: 1.31 mg/dL — AB (ref 0.44–1.00)
Calcium: 9.7 mg/dL (ref 8.9–10.3)
Chloride: 109 mmol/L (ref 101–111)
GFR calc non Af Amer: 41 mL/min — ABNORMAL LOW (ref >60)
GFR, EST AFRICAN AMERICAN: 47 mL/min — AB (ref >60)
GLUCOSE: 85 mg/dL (ref 65–99)
Potassium: 4.3 mmol/L (ref 3.5–5.1)
Sodium: 142 mmol/L (ref 135–145)
Total Protein: 6.4 g/dL — ABNORMAL LOW (ref 6.5–8.1)

## 2014-11-05 LAB — TROPONIN I
Troponin I: <0.03 ng/mL (ref <0.031)
Troponin I: <0.03 ng/mL (ref <0.031)

## 2014-11-05 LAB — RAPID URINE DRUG SCREEN, HOSP PERFORMED
Amphetamines: NOT DETECTED
BENZODIAZEPINES: POSITIVE — AB
Barbiturates: NOT DETECTED
COCAINE: NOT DETECTED
Opiates: NOT DETECTED
Tetrahydrocannabinol: NOT DETECTED

## 2014-11-05 LAB — CREATININE, URINE, RANDOM: Creatinine, Urine: 76.15 mg/dL

## 2014-11-05 LAB — CBC
HEMATOCRIT: 35.6 % — AB (ref 36.0–46.0)
Hemoglobin: 11 g/dL — ABNORMAL LOW (ref 12.0–15.0)
MCH: 26.3 pg (ref 26.0–34.0)
MCHC: 30.9 g/dL (ref 30.0–36.0)
MCV: 85 fL (ref 78.0–100.0)
PLATELETS: 235 10*3/uL (ref 150–400)
RBC: 4.19 MIL/uL (ref 3.87–5.11)
RDW: 13.2 % (ref 11.5–15.5)
WBC: 3.7 10*3/uL — AB (ref 4.0–10.5)

## 2014-11-05 LAB — URINALYSIS, ROUTINE W REFLEX MICROSCOPIC
Bilirubin Urine: NEGATIVE
Glucose, UA: NEGATIVE mg/dL
HGB URINE DIPSTICK: NEGATIVE
Ketones, ur: NEGATIVE mg/dL
Nitrite: POSITIVE — AB
PROTEIN: NEGATIVE mg/dL
SPECIFIC GRAVITY, URINE: 1.018 (ref 1.005–1.030)
UROBILINOGEN UA: 0.2 mg/dL (ref 0.0–1.0)
pH: 7 (ref 5.0–8.0)

## 2014-11-05 LAB — BRAIN NATRIURETIC PEPTIDE: B Natriuretic Peptide: 55.1 pg/mL (ref 0.0–100.0)

## 2014-11-05 LAB — GLUCOSE, CAPILLARY: GLUCOSE-CAPILLARY: 93 mg/dL (ref 65–99)

## 2014-11-05 LAB — URINE MICROSCOPIC-ADD ON

## 2014-11-05 LAB — TSH: TSH: 2.226 u[IU]/mL (ref 0.350–4.500)

## 2014-11-05 LAB — MAGNESIUM: Magnesium: 2.2 mg/dL (ref 1.7–2.4)

## 2014-11-05 MED ORDER — HEPARIN SODIUM (PORCINE) 5000 UNIT/ML IJ SOLN
5000.0000 [IU] | Freq: Three times a day (TID) | INTRAMUSCULAR | Status: DC
  Administered 2014-11-05: 5000 [IU] via SUBCUTANEOUS
  Filled 2014-11-05 (×4): qty 1

## 2014-11-05 MED ORDER — SODIUM CHLORIDE 0.9 % IJ SOLN
3.0000 mL | Freq: Two times a day (BID) | INTRAMUSCULAR | Status: DC
  Administered 2014-11-05: 3 mL via INTRAVENOUS

## 2014-11-05 MED ORDER — SODIUM CHLORIDE 0.9 % IV SOLN
INTRAVENOUS | Status: DC
  Administered 2014-11-05: 05:00:00 via INTRAVENOUS

## 2014-11-05 MED ORDER — ALPRAZOLAM 0.5 MG PO TABS: 0.2500 mg | ORAL_TABLET | Freq: Every day | ORAL | Status: DC

## 2014-11-05 MED ORDER — HYDRALAZINE HCL 10 MG PO TABS
10.0000 mg | ORAL_TABLET | Freq: Three times a day (TID) | ORAL | Status: DC
  Administered 2014-11-05: 10 mg via ORAL
  Filled 2014-11-05 (×3): qty 1

## 2014-11-05 MED ORDER — FLUVOXAMINE MALEATE 50 MG PO TABS
50.0000 mg | ORAL_TABLET | Freq: Every day | ORAL | Status: DC
  Administered 2014-11-05: 50 mg via ORAL
  Filled 2014-11-05: qty 1

## 2014-11-05 MED ORDER — ANASTROZOLE 1 MG PO TABS
1.0000 mg | ORAL_TABLET | Freq: Every day | ORAL | Status: DC
  Filled 2014-11-05: qty 1

## 2014-11-05 MED ORDER — FENTANYL 50 MCG/HR TD PT72: 50.0000 ug | MEDICATED_PATCH | TRANSDERMAL | Status: DC

## 2014-11-05 MED ORDER — FLUVOXAMINE MALEATE 50 MG PO TABS: 50.0000 mg | ORAL_TABLET | Freq: Two times a day (BID) | ORAL | Status: DC

## 2014-11-05 MED ORDER — BUPROPION HCL ER (XL) 150 MG PO TB24
150.0000 mg | ORAL_TABLET | Freq: Every day | ORAL | Status: DC
  Administered 2014-11-05: 150 mg via ORAL
  Filled 2014-11-05: qty 1

## 2014-11-05 MED ORDER — DOCUSATE SODIUM 100 MG PO CAPS: 200.0000 mg | ORAL_CAPSULE | Freq: Every day | ORAL | Status: DC

## 2014-11-05 MED ORDER — FENTANYL 25 MCG/HR TD PT72: 25.0000 ug | MEDICATED_PATCH | TRANSDERMAL | Status: DC

## 2014-11-05 MED ORDER — ACETAMINOPHEN 650 MG RE SUPP: 650.0000 mg | Freq: Four times a day (QID) | RECTAL | Status: DC | PRN

## 2014-11-05 MED ORDER — FLUVOXAMINE MALEATE 100 MG PO TABS
100.0000 mg | ORAL_TABLET | Freq: Every day | ORAL | Status: DC
  Filled 2014-11-05: qty 1

## 2014-11-05 MED ORDER — ACETAMINOPHEN 325 MG PO TABS: 650.0000 mg | ORAL_TABLET | Freq: Four times a day (QID) | ORAL | Status: DC | PRN

## 2014-11-05 MED ORDER — ALPRAZOLAM 0.25 MG PO TABS: 0.2500 mg | ORAL_TABLET | Freq: Every evening | ORAL | Status: DC | PRN

## 2014-11-05 MED ORDER — ROPINIROLE HCL 0.5 MG PO TABS
0.5000 mg | ORAL_TABLET | Freq: Every evening | ORAL | Status: DC | PRN
Start: 2014-11-05 — End: 2014-11-05
  Filled 2014-11-05: qty 1

## 2014-11-05 MED ORDER — SODIUM CHLORIDE 0.9 % IV SOLN: INTRAVENOUS | Status: DC

## 2014-11-05 MED ORDER — TRAZODONE HCL 50 MG PO TABS: 50.0000 mg | ORAL_TABLET | Freq: Every day | ORAL | Status: DC

## 2014-11-05 MED ORDER — FENTANYL 25 MCG/HR TD PT72: 50.0000 ug | MEDICATED_PATCH | TRANSDERMAL | Status: DC

## 2014-11-05 MED ORDER — ALPRAZOLAM 0.25 MG PO TABS
0.2500 mg | ORAL_TABLET | Freq: Three times a day (TID) | ORAL | Status: DC | PRN
Start: 2014-11-05 — End: 2014-11-05

## 2014-11-05 MED ORDER — HYDRALAZINE HCL 20 MG/ML IJ SOLN: 5.0000 mg | INTRAMUSCULAR | Status: DC | PRN

## 2014-11-05 MED ORDER — HYDROCODONE-ACETAMINOPHEN 5-325 MG PO TABS: 1.0000 | ORAL_TABLET | ORAL | Status: DC | PRN

## 2014-11-05 NOTE — Progress Notes (Signed)
PT Cancellation Note  Patient Details Name: [name] MRN: [name] DOB: March 19, 1946   Cancelled Treatment:    Reason Eval/Treat Not Completed: Patient declined, no reason specified (pt tearful about medication changes and stated she's not up to walking right now. Will follow. )   [name] 11/05/2014, 12:24 PM  [phone]

## 2014-11-05 NOTE — Discharge Summary (Signed)
[name], is a 69 y.o. female  DOB June 01, 1945  MRN [name].  Admission date:  11/04/2014  Admitting Physician  Ivor Costa, MD  Discharge Date:  11/05/2014   Primary MD  No PCP Per Patient  Recommendations for primary care physician for things to follow:   Monitor narcotic and benzodiazepine dose closely   Admission Diagnosis  Weakness [R53.1]   Discharge Diagnosis  Weakness [R53.1]    Principal Problem:   Syncope Active Problems:   Weakness generalized   GI bleed   HTN (hypertension)   H/O Breast cancer status post lumpectomy, radiation therapy   Frequent falls   Acute renal failure superimposed on stage 3 chronic kidney disease   Narcotic addiction      Past Medical History  Diagnosis Date  . Hypertension   . Depression   . Back pain, chronic   . Breast cancer 09/09/10  . Cancer of colon 05/24/2011  . GIB (gastrointestinal bleeding)   . SDH (subdural hematoma)   . CKD (chronic kidney disease), stage III     Past Surgical History  Procedure Laterality Date  . Back surgery    . Shoulder surgery    . Tumor removal  Tumor removed R breast  . Lumbar disc surgery    . Partial hysterectomy    . Colonoscopy  05/03/2011    Procedure: COLONOSCOPY;  Surgeon: Jeryl Columbia, MD;  Location: [facility] END[facility]COPY;  Service: Endoscopy;  Laterality: N/A;  . Colon surgery  04/2011  . Breast lumpectomy  10/10/2010    Rt Breast  . Joint replacement      R&L total shoulder replacements       HPI  from the history and physical done on the day of admission:    [name] is a 69 y.o. female with PMH of hypertension, depression, history of breast cancer 2012 on anastrozole, history of colon cancer 2012, history of SDH, history of GI bleeding, who presents with generalized weakness and  syncope.  Patient was recently hospitalized from 6/7 to 6/10 because of generalized weakness and frequent fall. She had hypokalemia which was corrected. She has been doing fine until yesterday when she had 2 episode of syncope. She also complaints of generalized weakness again. She does not have cough, chest pain and palpitation, but reporting mild shortness of breath. No symptoms of UTI, diarrhea, abdominal pain. No unilateral weakness, numbness or tingling sensations.  In ED, patient was found to have WBC 4.7, normal temperature, slightly bradycardia, slightly worsening renal function. Chest x-ray is negative for acute abnormalities. EKG showed supraventricular bigeminy. Patient is admitted to inpatient for further evaluation and treatment.      Hospital Course:     1. Syncope due to accidental narcotic overdose. Discussed her case with her pain management physician Dr. Hardin Negus who agrees that patient has been overusing narcotics for a while and he has been trying to cut her narcotics back. He is concerned that every time he brings this topic she starts to cry and  gets very unhappy. He urges me to cut her fentanyl patch into half which I will, she also received 90 pills of oral Vicodin on 09/17/2014 from [facility] on [address] I discussed this with the pharmacist personally. Patient claims that she is out of this medication and this has been taken off of her MAR.  Will also cut back her benzodiazepine, she was clearly somnolent this morning when I was examining her and she was sleeping of over my hand while I was listening to her heart sounds.   A few hours later she was much more alert awake, not orthostatic, stable on telemetry, recent echogram from a few months ago is completely unremarkable with a preserved EF and no major wall motion abnormality or valvular abnormality. She walked with PT in the hallway, will be discharged with home PT, extensively counseled her to cut back her  narcotics and benzodiazepine's and not to overuse. She gets extremely tearful and resistant to this idea.    2. History of depression. Continue psych medications as before follow with Dr. Priscille Loveless in the outpatient setting.    3.HTN - stable continue Lasix hydralazine at home dose.    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      Discharge Condition: Stable  Follow UP  Follow-up Information    Follow up with Your primary care physician and pain management doctor Dr. Hardin Negus. Schedule an appointment as soon as possible for a visit in 4 days.       Consults obtained - None  Diet and Activity recommendation: See Discharge Instructions below  Discharge Instructions           Discharge Instructions    Diet - low sodium heart healthy    Complete by:  As directed      Discharge instructions    Complete by:  As directed   Follow with Primary MD and the pain management doctor Dr. Hardin Negus in 2-3 days   Get CBC, CMP, 2 view Chest X ray checked  by Primary MD next visit.    Activity: As tolerated with Full fall precautions use walker/cane & assistance as needed   Disposition Home     Diet: Heart Healthy    For Heart failure patients - Check your Weight same time everyday, if you gain over 2 pounds, or you develop in leg swelling, experience more shortness of breath or chest pain, call your Primary MD immediately. Follow Cardiac Low Salt Diet and 1.5 lit/day fluid restriction.   On your next visit with your primary care physician please Get Medicines reviewed and adjusted.   Please request your Prim.MD to go over all Hospital Tests and Procedure/Radiological results at the follow up, please get all Hospital records sent to your Prim MD by signing hospital release before you go home.   If you experience worsening of your admission symptoms, develop shortness of breath, life threatening emergency, suicidal or homicidal thoughts you must seek medical attention  immediately by calling 911 or calling your MD immediately  if symptoms less severe.  You Must read complete instructions/literature along with all the possible adverse reactions/side effects for all the Medicines you take and that have been prescribed to you. Take any new Medicines after you have completely understood and accpet all the possible adverse reactions/side effects.   Do not drive, operating heavy machinery, perform activities at heights, swimming or participation in water activities or provide baby sitting services if your were admitted for syncope or  siezures until you have seen by Primary MD or a Neurologist and advised to do so again.  Do not drive when taking Pain medications.    Do not take more than prescribed Pain, Sleep and Anxiety Medications  Special Instructions: If you have smoked or chewed Tobacco  in the last 2 yrs please stop smoking, stop any regular Alcohol  and or any Recreational drug use.  Wear Seat belts while driving.   Please note  You were cared for by a hospitalist during your hospital stay. If you have any questions about your discharge medications or the care you received while you were in the hospital after you are discharged, you can call the unit and asked to speak with the hospitalist on call if the hospitalist that took care of you is not available. Once you are discharged, your primary care physician will handle any further medical issues. Please note that NO REFILLS for any discharge medications will be authorized once you are discharged, as it is imperative that you return to your primary care physician (or establish a relationship with a primary care physician if you do not have one) for your aftercare needs so that they can reassess your need for medications and monitor your lab values.     Increase activity slowly    Complete by:  As directed              Discharge Medications       Medication List    STOP taking these medications         fentaNYL 50 MCG/HR  Commonly known as:  DURAGESIC - dosed mcg/hr  Replaced by:  fentaNYL 25 MCG/HR patch     HYDROcodone-acetaminophen 5-325 MG per tablet  Commonly known as:  NORCO/VICODIN      TAKE these medications        ALPRAZolam 0.5 MG tablet  Commonly known as:  XANAX  Take 0.5 tablets (0.25 mg total) by mouth at bedtime.     anastrozole 1 MG tablet  Commonly known as:  ARIMIDEX  Take 1 tablet (1 mg total) by mouth at bedtime.     buPROPion 150 MG 24 hr tablet  Commonly known as:  WELLBUTRIN XL  Take 150 mg by mouth daily.     docusate sodium 100 MG capsule  Commonly known as:  COLACE  Take 200 mg by mouth at bedtime.     fentaNYL 25 MCG/HR patch  Commonly known as:  DURAGESIC - dosed mcg/hr  Place 2 patches (50 mcg total) onto the skin every 3 (three) days.     fluvoxaMINE 50 MG tablet  Commonly known as:  LUVOX  Take 50-100 mg by mouth 2 (two) times daily. She takes one tablet in the morning and two tablets in the evening.     furosemide 20 MG tablet  Commonly known as:  LASIX  Take 20 mg by mouth daily.     hydrALAZINE 10 MG tablet  Commonly known as:  APRESOLINE  Take 1 tablet (10 mg total) by mouth 3 (three) times daily.     potassium chloride SA 20 MEQ tablet  Commonly known as:  K-DUR,KLOR-CON  Take 1 tablet (20 mEq total) by mouth daily.     rOPINIRole 0.5 MG tablet  Commonly known as:  REQUIP  Take 0.5 mg by mouth at bedtime as needed ("restlessness").     traZODone 50 MG tablet  Commonly known as:  DESYREL  Take 1 tablet by mouth at bedtime.  Major procedures and Radiology Reports - PLEASE review detailed and final reports for all details, in brief -       Dg Chest 2 View  11/05/2014   CLINICAL DATA:  Severe generalized weakness. Hypokalemia and cough. Status post fall 2 days ago. Initial encounter.  EXAM: CHEST  2 VIEW  COMPARISON:  Chest radiograph performed 10/27/2014  FINDINGS: The lungs are well-aerated. Pulmonary  vascularity is at the upper limits of normal. There is no evidence of focal opacification, pleural effusion or pneumothorax. Postoperative change is noted overlying the right lung base.  The heart is normal in size; the mediastinal contour is within normal limits. No acute osseous abnormalities are seen. Bilateral shoulder arthroplasties are grossly unremarkable in appearance. Lumbar spinal fusion hardware is partially imaged.  IMPRESSION: No acute cardiopulmonary process seen.   Electronically Signed   By: Garald Balding M.D.   On: 11/05/2014 01:44   Dg Chest 2 View  10/27/2014   CLINICAL DATA:  Syncopal episodes with multiple falls for 2 days. History of breast and colon cancer. Smoker.  EXAM: CHEST  2 VIEW  COMPARISON:  08/21/2014.  FINDINGS: Cardiomegaly. Old RIGHT rib fractures. BILATERAL shoulder replacement. No infiltrates or failure. Thoracic degenerative change. Previous lumbar fusion.  IMPRESSION: No active cardiopulmonary disease.   Electronically Signed   By: Rolla Flatten M.D.   On: 10/27/2014 21:24   Ct Head Wo Contrast  10/27/2014   CLINICAL DATA:  Multiple falls in the past 2 days. Syncopal episodes.  EXAM: CT HEAD WITHOUT CONTRAST  TECHNIQUE: Contiguous axial images were obtained from the base of the skull through the vertex without intravenous contrast.  COMPARISON:  09/24/2014.  FINDINGS: Mild cerebral and cerebellar atrophy. Extensive white matter disease, either post treatment effect or chronic microvascular ischemic change. Remote RIGHT basal ganglia lacunar infarct. No acute stroke or hemorrhage is evident. No intracranial mass lesion on this noncontrast exam. No extra-axial fluid. No sclerotic or lytic calvarial lesions. No sinus air-fluid level. Negative mastoids. Negative visualized orbits. Similar appearance to priors.  Specifically, no recurrence of previous subdural hematoma  IMPRESSION: Chronic changes as described. No acute intracranial findings. Atrophy and small vessel disease. No  recurrent subdural hematoma.   Electronically Signed   By: Rolla Flatten M.D.   On: 10/27/2014 21:11    Micro Results      No results found for this or any previous visit (from the past 240 hour(s)).     Today   Subjective    [name] today has no headache,no chest abdominal pain,no new weakness tingling or numbness, feels much better wants to go home today.     Objective   Blood pressure 169/59, pulse 61, temperature 98.5 F (36.9 C), temperature source Oral, resp. rate 18, height 5\' 3"  (1.6 m), weight 58.4 kg (128 lb 12 oz), SpO2 97 %.   Intake/Output Summary (Last 24 hours) at 11/05/14 1314 Last data filed at 11/05/14 0900  Gross per 24 hour  Intake      0 ml  Output      0 ml  Net      0 ml    Exam Awake Alert, Oriented x 3, No new F.N deficits, Normal affect [place].AT,PERRAL Supple Neck,No JVD, No cervical lymphadenopathy appriciated.  Symmetrical Chest wall movement, Good air movement bilaterally, CTAB RRR,No Gallops,Rubs or new Murmurs, No Parasternal Heave +ve B.Sounds, Abd Soft, Non tender, No organomegaly appriciated, No rebound -guarding or rigidity. No Cyanosis, Clubbing or edema, No new Rash  or bruise    Data Review   CBC w Diff:  Lab Results  Component Value Date   WBC 3.7* 11/05/2014   WBC 3.8* 05/20/2014   HGB 11.0* 11/05/2014   HGB 12.8 05/20/2014   HCT 35.6* 11/05/2014   HCT 39.5 05/20/2014   PLT 235 11/05/2014   PLT 235 05/20/2014   LYMPHOPCT 33 10/27/2014   LYMPHOPCT 36.3 05/20/2014   MONOPCT 9 10/27/2014   MONOPCT 8.6 05/20/2014   EOSPCT 1 10/27/2014   EOSPCT 1.7 05/20/2014   BASOPCT 0 10/27/2014   BASOPCT 0.8 05/20/2014    CMP:  Lab Results  Component Value Date   NA 142 11/05/2014   NA 141 05/20/2014   K 4.3 11/05/2014   K 3.4* 05/20/2014   CL 109 11/05/2014   CL 101 04/01/2012   CO2 25 11/05/2014   CO2 30* 05/20/2014   BUN 37* 11/05/2014   BUN 25.8 05/20/2014   CREATININE 1.31* 11/05/2014   CREATININE 1.6*  05/20/2014   PROT 6.4* 11/05/2014   PROT 6.6 05/20/2014   ALBUMIN 3.7 11/05/2014   ALBUMIN 3.9 05/20/2014   BILITOT 0.5 11/05/2014   BILITOT 0.46 05/20/2014   ALKPHOS 83 11/05/2014   ALKPHOS 78 05/20/2014   AST 39 11/05/2014   AST 19 05/20/2014   ALT 29 11/05/2014   ALT 13 05/20/2014  .   Total Time in preparing paper work, data evaluation and todays exam - 35 minutes  Thurnell Lose M.D on 11/05/2014 at 1:14 PM  [facility]   Office  [phone]

## 2014-11-05 NOTE — ED Notes (Signed)
Pt unable to void at this time. 

## 2014-11-05 NOTE — Progress Notes (Signed)
Discharge instructions given on medications,and follow up visits,patient verbalized understanding.Prescriptions sent with patient.Accompanied by staff to an awaiting vehicle. 

## 2014-11-05 NOTE — Discharge Instructions (Signed)
Follow with Primary MD and the pain management doctor Dr. Hardin Negus in 2-3 days   Get CBC, CMP, 2 view Chest X ray checked  by Primary MD next visit.    Activity: As tolerated with Full fall precautions use walker/cane & assistance as needed   Disposition Home     Diet: Heart Healthy    For Heart failure patients - Check your Weight same time everyday, if you gain over 2 pounds, or you develop in leg swelling, experience more shortness of breath or chest pain, call your Primary MD immediately. Follow Cardiac Low Salt Diet and 1.5 lit/day fluid restriction.   On your next visit with your primary care physician please Get Medicines reviewed and adjusted.   Please request your Prim.MD to go over all Hospital Tests and Procedure/Radiological results at the follow up, please get all Hospital records sent to your Prim MD by signing hospital release before you go home.   If you experience worsening of your admission symptoms, develop shortness of breath, life threatening emergency, suicidal or homicidal thoughts you must seek medical attention immediately by calling 911 or calling your MD immediately  if symptoms less severe.  You Must read complete instructions/literature along with all the possible adverse reactions/side effects for all the Medicines you take and that have been prescribed to you. Take any new Medicines after you have completely understood and accpet all the possible adverse reactions/side effects.   Do not drive, operating heavy machinery, perform activities at heights, swimming or participation in water activities or provide baby sitting services if your were admitted for syncope or siezures until you have seen by Primary MD or a Neurologist and advised to do so again.  Do not drive when taking Pain medications.    Do not take more than prescribed Pain, Sleep and Anxiety Medications  Special Instructions: If you have smoked or chewed Tobacco  in the last 2 yrs please stop  smoking, stop any regular Alcohol  and or any Recreational drug use.  Wear Seat belts while driving.   Please note  You were cared for by a hospitalist during your hospital stay. If you have any questions about your discharge medications or the care you received while you were in the hospital after you are discharged, you can call the unit and asked to speak with the hospitalist on call if the hospitalist that took care of you is not available. Once you are discharged, your primary care physician will handle any further medical issues. Please note that NO REFILLS for any discharge medications will be authorized once you are discharged, as it is imperative that you return to your primary care physician (or establish a relationship with a primary care physician if you do not have one) for your aftercare needs so that they can reassess your need for medications and monitor your lab values.

## 2014-11-05 NOTE — Evaluation (Signed)
Physical Therapy Evaluation Patient Details Name: [name] MRN: [id number] DOB: [birth year] Today's Date: 11/05/2014   History of Present Illness  69 y.o. female with history of chronic pain, falls, CKD III, HTN, presents 6/67/16 with main concern of persistent weakness and recurrent falls over one month in duration. Dx of hypokalemia.  Pt was [place] home 10/30/14 and re-admitted 11/04/14 with syncope, ARF.   Clinical Impression  Pt ambulated 260' in hall without assistive device, no loss of balance, but with occaissional use of handrail for support. HHPT recommended for further balance training.    Follow Up Recommendations Home health PT;Supervision for mobility/OOB    Equipment Recommendations  Other (comment) (another grab bar beside tub)    Recommendations for Other Services OT consult     Precautions / Restrictions Precautions Precautions: Fall Precaution Comments: pt reports several falls in past year, some syncopal, some slipping on wet floor Restrictions Weight Bearing Restrictions: No      Mobility  Bed Mobility               General bed mobility comments: NT -up in recliner  Transfers Overall transfer level: Modified independent Equipment used: None             General transfer comment: pushed up from armrests  Ambulation/Gait Ambulation/Gait assistance: Independent Ambulation Distance (Feet): 250 Feet Assistive device: None Gait Pattern/deviations: Step-through pattern;Decreased stride length (forward head)   Gait velocity interpretation: at or above normal speed for age/gender General Gait Details: pt occaissionally reached for handrail in hall but declined use of cane or RW  Stairs            Wheelchair Mobility    Modified Rankin (Stroke Patients Only)       Balance     Sitting balance-Leahy Scale: Good       Standing balance-Leahy Scale: Fair                               Pertinent Vitals/Pain Pain  Assessment: No/denies pain    Home Living Family/patient expects to be discharged to:: Private residence Living Arrangements: Alone Available Help at Discharge: Family;Friend(s);Available PRN/intermittently Type of Home: House Home Access: Stairs to enter Entrance Stairs-Rails: Left (pt reports landlord to put in ramp) Entrance Stairs-Number of Steps: 7 Home Layout: One level Home Equipment: Walker - 2 wheels;Grab bars - tub/shower      Prior Function Level of Independence: Independent               Hand Dominance   Dominant Hand: Right    Extremity/Trunk Assessment     RUE Deficits / Details: shoulder elevation to about 80,, bilateral  total shoulder replacementas     LUE Deficits / Details:  90 elevation of shoulder   Lower Extremity Assessment: Overall WFL for tasks assessed      Cervical / Trunk Assessment: Kyphotic;Other exceptions  Communication   Communication: No difficulties  Cognition Arousal/Alertness: Awake/alert Behavior During Therapy: WFL for tasks assessed/performed Overall Cognitive Status: Within Functional Limits for tasks assessed                      General Comments      Exercises        Assessment/Plan    PT Assessment All further PT needs can be met in the next venue of care;Patent does not need any further PT services  PT Diagnosis Difficulty walking;Generalized weakness  PT Problem List Decreased strength;Decreased activity tolerance;Decreased balance;Decreased mobility;Decreased knowledge of precautions;Decreased range of motion  PT Treatment Interventions     PT Goals (Current goals can be found in the Care Plan section) Acute Rehab PT Goals Patient Stated Goal: to go home to my dogs PT Goal Formulation: With patient Time For Goal Achievement: 11/05/14 Potential to Achieve Goals: Good    Frequency     Barriers to discharge        Co-evaluation               End of Session Equipment Utilized During  Treatment: Gait belt Activity Tolerance: Patient tolerated treatment well Patient left: with call bell/phone within reach;in chair Nurse Communication: Mobility status         Time: 4383-6542 PT Time Calculation (min) (ACUTE ONLY): 11 min   Charges:   PT Evaluation $Initial PT Evaluation Tier I: 1 Procedure     PT G CodesBlondell Reveal Kistler 11/05/2014, 1:04 PM [phone]

## 2014-11-05 NOTE — Care Management Note (Signed)
Case Management Note  Patient Details  Name: [name] MRN: [name] Date of Birth: [birth year]  Subjective/Objective:       69 yo female admitted with syncope and weakness             Action/Plan: Spoke with patient regarding previous discharge with [facility] services with [facility]. Patient confirmed that [facility] has been working with her. Confirmed HH involvement with [facility] rep, Kristen. Updated patient prior to discharge that [facility] RN and PT has been reordered and will continue to follow.  Expected Discharge Date:  11/09/14               Expected Discharge Plan:  [place]  In-House Referral:     Discharge planning Services  CM Consult  Post Acute Care Choice:    Choice offered to:  Patient  DME Arranged:    DME Agency:     HH Arranged:  RN, PT [place] Agency:  [place]  Status of Service:  Completed, signed off  Medicare Important Message Given:    Date Medicare IM Given:    Medicare IM give by:    Date Additional Medicare IM Given:    Additional Medicare Important Message give by:     If discussed at [place] of Stay Meetings, dates discussed:    Additional Comments:  Scot Dock, RN 11/05/2014, 1:31 PM

## 2014-11-05 NOTE — Progress Notes (Signed)
OT Cancellation Note  Patient Details Name: [name] MRN: [name] DOB: May 12, 1946   Cancelled Treatment:    Reason Eval/Treat Not Completed: Patient declined, tearful regarding her situation and medication changes, did not wish to participate in OT evaluation at this time. Will follow up for OT evaluation tomorrow.  Tonisha Silvey A 11/05/2014, 12:00 PM

## 2014-11-05 NOTE — ED Notes (Signed)
Patient transported to X-ray 

## 2014-11-05 NOTE — ED Provider Notes (Signed)
CSN: [id number]     Arrival date & time 11/04/14  2104 History   First MD Initiated Contact with Patient 11/05/14 0032     Chief Complaint  Patient presents with  . Weakness     (Consider location/radiation/quality/duration/timing/severity/associated sxs/prior Treatment) HPI   [name] is a 69yo female, PMH HTN, colon cancer, here with weakness.  She states she has been weak and had syncopal episodes at home. Last time this happened she had critically low potassium. She has been taking her potassium everyday but he symptoms persist.  She denies pain anywhere.  She states she does get sleepy after taking her meds.  She denies infectious history.  Patient is frequently falling asleep upon questioning. There are no further complaints.   10 Systems reviewed and are negative for acute change except as noted in the HPI.   Past Medical History  Diagnosis Date  . Hypertension   . Depression   . Back pain, chronic   . Breast cancer 09/09/10  . Cancer of colon 05/24/2011   Past Surgical History  Procedure Laterality Date  . Back surgery    . Shoulder surgery    . Tumor removal  Tumor removed R breast  . Lumbar disc surgery    . Partial hysterectomy    . Colonoscopy  05/03/2011    Procedure: COLONOSCOPY;  Surgeon: Jeryl Columbia, MD;  Location: [facility] ENDOSCOPY;  Service: Endoscopy;  Laterality: N/A;  . Colon surgery  04/2011  . Breast lumpectomy  10/10/2010    Rt Breast  . Joint replacement      R&L total shoulder replacements   Family History  Problem Relation Age of Onset  . Diabetes Mother   . Hypertension Mother   . Cancer Mother     leukemia  . Sudden death Mother   . Anesthesia problems Neg Hx   . Hypotension Neg Hx   . Malignant hyperthermia Neg Hx   . Pseudochol deficiency Neg Hx    History  Substance Use Topics  . Smoking status: Former Smoker -- 1.00 packs/day    Types: Cigarettes  . Smokeless tobacco: Former Systems developer    Quit date: 04/22/2010  . Alcohol Use: No   OB  History    No data available     Review of Systems    Allergies  Review of patient's allergies indicates no known allergies.  Home Medications   Prior to Admission medications   Medication Sig Start Date End Date Taking? Authorizing Provider  ALPRAZolam Duanne Moron) 0.5 MG tablet Take 0.5 tablets (0.25 mg total) by mouth 3 (three) times daily as needed for anxiety. 10/30/14   Theodis Blaze, MD  anastrozole (ARIMIDEX) 1 MG tablet Take 1 tablet (1 mg total) by mouth at bedtime. 05/20/14   Truitt Merle, MD  buPROPion (WELLBUTRIN XL) 150 MG 24 hr tablet Take 150 mg by mouth daily.    Historical Provider, MD  docusate sodium (COLACE) 100 MG capsule Take 200 mg by mouth at bedtime.    Historical Provider, MD  fentaNYL (DURAGESIC - DOSED MCG/HR) 50 MCG/HR 1 patch every 3 (three) days. 10/16/14   Historical Provider, MD  fluvoxaMINE (LUVOX) 50 MG tablet Take 50-100 mg by mouth 2 (two) times daily. She takes one tablet in the morning and two tablets in the evening.    Historical Provider, MD  furosemide (LASIX) 20 MG tablet Take 20 mg by mouth daily.    Historical Provider, MD  hydrALAZINE (APRESOLINE) 10 MG tablet Take 1  tablet (10 mg total) by mouth 3 (three) times daily. 10/30/14   Theodis Blaze, MD  HYDROcodone-acetaminophen (NORCO/VICODIN) 5-325 MG per tablet Take 1 tablet by mouth every 4 (four) hours as needed for moderate pain. 10/30/14   Theodis Blaze, MD  potassium chloride SA (K-DUR,KLOR-CON) 20 MEQ tablet Take 1 tablet (20 mEq total) by mouth daily. 10/30/14   Theodis Blaze, MD  rOPINIRole (REQUIP) 0.5 MG tablet Take 0.5 mg by mouth at bedtime as needed ("restlessness").     Historical Provider, MD  traZODone (DESYREL) 50 MG tablet Take 1 tablet by mouth at bedtime. 10/16/14   Historical Provider, MD   BP 138/44 mmHg  Pulse 65  Temp(Src) 98 F (36.7 C) (Oral)  Resp 16  SpO2 100% Physical Exam  Constitutional: She is oriented to person, place, and time. She appears well-developed and  well-nourished. No distress.  HENT:  Head: Normocephalic and atraumatic.  Nose: Nose normal.  Mouth/Throat: Oropharynx is clear and moist. No oropharyngeal exudate.  Eyes: Conjunctivae and EOM are normal. Pupils are equal, round, and reactive to light. No scleral icterus.  Neck: Normal range of motion. Neck supple. No JVD present. No tracheal deviation present. No thyromegaly present.  Cardiovascular: Normal rate, regular rhythm and normal heart sounds.  Exam reveals no gallop and no friction rub.   No murmur heard. Reg irreg rhythm  Pulmonary/Chest: Effort normal and breath sounds normal. No respiratory distress. She has no wheezes. She exhibits no tenderness.  Abdominal: Soft. Bowel sounds are normal. She exhibits no distension and no mass. There is no tenderness. There is no rebound and no guarding.  Musculoskeletal: Normal range of motion. She exhibits no edema or tenderness.  Lymphadenopathy:    She has no cervical adenopathy.  Neurological: She is alert and oriented to person, place, and time. No cranial nerve deficit. She exhibits normal muscle tone.  Drowsy but easily arousable  Skin: Skin is warm and dry. No rash noted. She is not diaphoretic. No erythema. No pallor.  Nursing note and vitals reviewed.   ED Course  Procedures (including critical care time) Labs Review Labs Reviewed  CBC - Abnormal; Notable for the following:    Hemoglobin 10.6 (*)    HCT 34.0 (*)    All other components within normal limits  BASIC METABOLIC PANEL - Abnormal; Notable for the following:    BUN 39 (*)    Creatinine, Ser 1.44 (*)    GFR calc non Af Amer 36 (*)    GFR calc Af Amer 42 (*)    All other components within normal limits  MAGNESIUM  URINALYSIS, ROUTINE W REFLEX MICROSCOPIC (NOT AT [facility])  URINE RAPID DRUG SCREEN, HOSP PERFORMED    Imaging Review Dg Chest 2 View  11/05/2014   CLINICAL DATA:  Severe generalized weakness. Hypokalemia and cough. Status post fall 2 days ago. Initial  encounter.  EXAM: CHEST  2 VIEW  COMPARISON:  Chest radiograph performed 10/27/2014  FINDINGS: The lungs are well-aerated. Pulmonary vascularity is at the upper limits of normal. There is no evidence of focal opacification, pleural effusion or pneumothorax. Postoperative change is noted overlying the right lung base.  The heart is normal in size; the mediastinal contour is within normal limits. No acute osseous abnormalities are seen. Bilateral shoulder arthroplasties are grossly unremarkable in appearance. Lumbar spinal fusion hardware is partially imaged.  IMPRESSION: No acute cardiopulmonary process seen.   Electronically Signed   By: Garald Balding M.D.   On: 11/05/2014  01:44     EKG Interpretation   Date/Time:  Thursday November 05 2014 01:37:12 EDT Ventricular Rate:  59 PR Interval:  153 QRS Duration: 109 QT Interval:  451 QTC Calculation: 447 R Axis:   82 Text Interpretation:  Sinus rhythm Supraventricular bigeminy Borderline  right axis deviation Confirmed by Glynn Octave [phone]) on  11/05/2014 1:47:55 AM      MDM   Final diagnoses:  Weakness    Patient presents to the ED for weakness and syncope.  ED workup reveals bigeminy.  Patient is symptomatic from this and requires admission for further work up.  Will call [facility] hospitalist for admission. Infectious work up is negative.  Potassium is normal today.      Everlene Balls, MD 11/05/14 [phone]

## 2014-11-05 NOTE — H&P (Signed)
[facility] History and Physical  [name] [id number] DOB: Mar 27, 1946 DOA: 11/04/2014  Referring physician: ED physician PCP: No PCP Per Patient  Specialists:   Chief Complaint: Generalized weakness and syncope  HPI: [name] is a 69 y.o. female with PMH of hypertension, depression, history of breast cancer 2012 on anastrozole, history of colon cancer 2012, history of SDH, history of GI bleeding, who presents with generalized weakness and syncope.  Patient was recently hospitalized from 6/7 to 6/10 because of generalized weakness and frequent fall. She had hypokalemia which was corrected. She has been doing fine until yesterday when she had 2 episode of syncope. She also complaints of generalized weakness again. She does not have cough, chest pain and palpitation, but reporting mild shortness of breath. No symptoms of UTI, diarrhea, abdominal pain. No unilateral weakness, numbness or tingling sensations.  In ED, patient was found to have WBC 4.7, normal temperature, slightly bradycardia, slightly worsening renal function. Chest x-ray is negative for acute abnormalities. EKG showed supraventricular bigeminy. Patient is admitted to inpatient for further evaluation and treatment.  Where does patient live?   At home    Can patient participate in ADLs?   Some   Review of Systems:   General: no fevers, chills, no changes in body weight, has poor appetite, has fatigue HEENT: no blurry vision, hearing changes or sore throat Pulm: has dyspnea, no coughing, wheezing CV: no chest pain, palpitations Abd: no nausea, vomiting, abdominal pain, diarrhea, constipation GU: no dysuria, burning on urination, increased urinary frequency, hematuria  Ext: no leg edema Neuro: no unilateral weakness, numbness, or tingling, no vision change or hearing loss Skin: no rash MSK: No muscle spasm, no deformity, no limitation of range of movement in spin Heme: No easy bruising.  Travel  history: No recent long distant travel.  Allergy: No Known Allergies  Past Medical History  Diagnosis Date  . Hypertension   . Depression   . Back pain, chronic   . Breast cancer 09/09/10  . Cancer of colon 05/24/2011  . GIB (gastrointestinal bleeding)   . SDH (subdural hematoma)   . CKD (chronic kidney disease), stage III     Past Surgical History  Procedure Laterality Date  . Back surgery    . Shoulder surgery    . Tumor removal  Tumor removed R breast  . Lumbar disc surgery    . Partial hysterectomy    . Colonoscopy  05/03/2011    Procedure: COLONOSCOPY;  Surgeon: Jeryl Columbia, MD;  Location: [facility] ENDOSCOPY;  Service: Endoscopy;  Laterality: N/A;  . Colon surgery  04/2011  . Breast lumpectomy  10/10/2010    Rt Breast  . Joint replacement      R&L total shoulder replacements    Social History:  reports that she has quit smoking. Her smoking use included Cigarettes. She smoked 1.00 pack per day. She quit smokeless tobacco use about 4 years ago. She reports that she does not drink alcohol or use illicit drugs.  Family History:  Family History  Problem Relation Age of Onset  . Diabetes Mother   . Hypertension Mother   . Cancer Mother     leukemia  . Sudden death Mother   . Anesthesia problems Neg Hx   . Hypotension Neg Hx   . Malignant hyperthermia Neg Hx   . Pseudochol deficiency Neg Hx      Prior to Admission medications   Medication Sig Start Date End Date Taking? Authorizing Provider  ALPRAZolam (  XANAX) 0.5 MG tablet Take 0.5 tablets (0.25 mg total) by mouth 3 (three) times daily as needed for anxiety. 10/30/14   Theodis Blaze, MD  anastrozole (ARIMIDEX) 1 MG tablet Take 1 tablet (1 mg total) by mouth at bedtime. 05/20/14   Truitt Merle, MD  buPROPion (WELLBUTRIN XL) 150 MG 24 hr tablet Take 150 mg by mouth daily.    Historical Provider, MD  docusate sodium (COLACE) 100 MG capsule Take 200 mg by mouth at bedtime.    Historical Provider, MD  fentaNYL (DURAGESIC - DOSED  MCG/HR) 50 MCG/HR 1 patch every 3 (three) days. 10/16/14   Historical Provider, MD  fluvoxaMINE (LUVOX) 50 MG tablet Take 50-100 mg by mouth 2 (two) times daily. She takes one tablet in the morning and two tablets in the evening.    Historical Provider, MD  furosemide (LASIX) 20 MG tablet Take 20 mg by mouth daily.    Historical Provider, MD  hydrALAZINE (APRESOLINE) 10 MG tablet Take 1 tablet (10 mg total) by mouth 3 (three) times daily. 10/30/14   Theodis Blaze, MD  HYDROcodone-acetaminophen (NORCO/VICODIN) 5-325 MG per tablet Take 1 tablet by mouth every 4 (four) hours as needed for moderate pain. 10/30/14   Theodis Blaze, MD  potassium chloride SA (K-DUR,KLOR-CON) 20 MEQ tablet Take 1 tablet (20 mEq total) by mouth daily. 10/30/14   Theodis Blaze, MD  rOPINIRole (REQUIP) 0.5 MG tablet Take 0.5 mg by mouth at bedtime as needed ("restlessness").     Historical Provider, MD  traZODone (DESYREL) 50 MG tablet Take 1 tablet by mouth at bedtime. 10/16/14   Historical Provider, MD    Physical Exam: Filed Vitals:   11/05/14 0331 11/05/14 0400 11/05/14 0454 11/05/14 0612  BP:  163/66 178/64 121/68  Pulse:  47 56 49  Temp:   98.2 F (36.8 C) 98.4 F (36.9 C)  TempSrc:   Oral Oral  Resp:  16 18 16   Height:   5\' 3"  (1.6 m)   Weight:   58.4 kg (128 lb 12 oz) 58.4 kg (128 lb 12 oz)  SpO2: 93% 95% 97% 98%   General: Not in acute distress, dry mucous in the membrane HEENT:       Eyes: PERRL, EOMI, no scleral icterus.       ENT: No discharge from the ears and nose, no pharynx injection, no tonsillar enlargement.        Neck: No JVD, no bruit, no mass felt. Heme: No neck lymph node enlargement. Cardiac: S1/S2, RRR, No murmurs, No gallops or rubs. Pulm: No rales, wheezing, rhonchi or rubs. Abd: Soft, nondistended, nontender, no rebound pain, no organomegaly, BS present. Ext: No pitting leg edema bilaterally. 2+DP/PT pulse bilaterally. Musculoskeletal: No joint deformities, No joint redness or warmth, no  limitation of ROM in spin. Skin: No rashes.  Neuro: Alert, oriented X3, cranial nerves II-XII grossly intact, muscle strength 5/5 in all extremities, sensation to light touch intact. Brachial reflex 2+ bilaterally. Knee reflex 1+ bilaterally. Negative Babinski's sign. Normal finger to nose test. Psych: Patient is not psychotic, no suicidal or hemocidal ideation.  Labs on Admission:  Basic Metabolic Panel:  Recent Labs Lab 10/29/14 0850 10/30/14 0535 11/04/14 2217 11/05/14 0515  NA 143 143 140 142  K 3.3* 4.6 4.7 4.3  CL 102 108 106 109  CO2 34* 30 25 25   GLUCOSE 109* 87 96 85  BUN 30* 27* 39* 37*  CREATININE 1.39* 1.19* 1.44* 1.31*  CALCIUM 9.6 9.4  9.6 9.7  MG  --   --  2.2  --    Liver Function Tests:  Recent Labs Lab 11/05/14 0515  AST 39  ALT 29  ALKPHOS 83  BILITOT 0.5  PROT 6.4*  ALBUMIN 3.7   No results for input(s): LIPASE, AMYLASE in the last 168 hours. No results for input(s): AMMONIA in the last 168 hours. CBC:  Recent Labs Lab 10/30/14 0535 11/04/14 2217 11/05/14 0515  WBC 3.9* 4.7 3.7*  HGB 10.0* 10.6* 11.0*  HCT 32.1* 34.0* 35.6*  MCV 87.5 86.5 85.0  PLT 216 256 235   Cardiac Enzymes:  Recent Labs Lab 11/05/14 0515  TROPONINI <0.03    BNP (last 3 results)  Recent Labs  11/05/14 0515  BNP 55.1    ProBNP (last 3 results) No results for input(s): PROBNP in the last 8760 hours.  CBG:  Recent Labs Lab 10/30/14 0737 11/05/14 0735  GLUCAP 92 93    Radiological Exams on Admission: Dg Chest 2 View  11/05/2014   CLINICAL DATA:  Severe generalized weakness. Hypokalemia and cough. Status post fall 2 days ago. Initial encounter.  EXAM: CHEST  2 VIEW  COMPARISON:  Chest radiograph performed 10/27/2014  FINDINGS: The lungs are well-aerated. Pulmonary vascularity is at the upper limits of normal. There is no evidence of focal opacification, pleural effusion or pneumothorax. Postoperative change is noted overlying the right lung base.  The  heart is normal in size; the mediastinal contour is within normal limits. No acute osseous abnormalities are seen. Bilateral shoulder arthroplasties are grossly unremarkable in appearance. Lumbar spinal fusion hardware is partially imaged.  IMPRESSION: No acute cardiopulmonary process seen.   Electronically Signed   By: Garald Balding M.D.   On: 11/05/2014 01:44    EKG: Independently reviewed.  Abnormal findings:           Not done in ED, will get one.   Assessment/Plan Principal Problem:   Syncope Active Problems:   Weakness generalized   GI bleed   HTN (hypertension)   H/O Breast cancer status post lumpectomy, radiation therapy   Frequent falls   Acute renal failure superimposed on stage 3 chronic kidney disease   Syncope: Etiology is not clear. She had CT head on 10/27/14, which showed no recurrent subdural hematoma and no acute abnormalities. Orthostatic status is possible given patient is taking Lasix, and clinically she is dry. No focal neurologic findings. A potential possibility is cardiac arrhythmia given her abnormal EKG showing supraventricular bigeminy. -will admit to tele bed -trop x 3 -repeat EKG in AM -2d echo -orthostatic vital signs -IVF: 75cc/h NS -neuro check q4h  Weakness generalized: Likely due to deconditioning secondary to multiple comorbidities. -PT and OT  H/O Breast cancer status post lumpectomy, radiation therapy: -on anastrozole  Acute renal failure superimposed on stage 3 chronic kidney disease: Baseline creatinine is about 1.2, her creatinine is 1.44, BUN 39. Likely due to prerenal secondary to dehydration and continuation of diruetics -IVF as above -Check FeUrea -US-renal -Follow up renal function by BMP -Hold Diuretics, Lasix now  HTN: -hold lasix -continue hydralazine orally -IV hydralazine when necessary  Depression and anxiety: Stable, no suicidal or homicidal ideations. -Continue home medications: Xanax, fluvoxamine, Requip, trazodone     DVT ppx: SQ Heparin   Code Status: Full code Family Communication: None at bed side.     Disposition Plan: Admit to inpatient   Date of Service 11/05/2014    Ivor Costa [facility] Pager [phone]  If  7PM-7AM, please contact night-coverage [email or web address] Password [email or web address] 11/05/2014, 8:22 AM

## 2014-11-06 LAB — UREA NITROGEN, URINE: Urea Nitrogen, Ur: 808 mg/dL

## 2014-11-19 ENCOUNTER — Telehealth: Payer: Self-pay | Admitting: *Deleted

## 2014-11-19 NOTE — Telephone Encounter (Signed)
Left message to call back   Event monitor reviewed by  Dr. Mare Ferrari   Interpretation: No Atrial fib or significant arrhythmia. Occasional PVC's

## 2014-11-25 NOTE — Telephone Encounter (Signed)
Left message to call back  

## 2014-12-08 NOTE — Telephone Encounter (Signed)
Left message to call back  

## 2014-12-09 ENCOUNTER — Emergency Department (HOSPITAL_COMMUNITY)
Admission: EM | Admit: 2014-12-09 | Discharge: 2014-12-09 | Disposition: A | Discharge status: Home / Self Care | Payer: Medicare Other | Attending: Emergency Medicine | Admitting: Emergency Medicine

## 2014-12-09 ENCOUNTER — Encounter: Payer: Self-pay | Admitting: Cardiology

## 2014-12-09 ENCOUNTER — Encounter (HOSPITAL_COMMUNITY): Payer: Self-pay | Admitting: *Deleted

## 2014-12-09 DIAGNOSIS — F329 Major depressive disorder, single episode, unspecified: Secondary | ICD-10-CM | POA: Diagnosis not present

## 2014-12-09 DIAGNOSIS — Z79899 Other long term (current) drug therapy: Secondary | ICD-10-CM | POA: Insufficient documentation

## 2014-12-09 DIAGNOSIS — Z87891 Personal history of nicotine dependence: Secondary | ICD-10-CM | POA: Insufficient documentation

## 2014-12-09 DIAGNOSIS — I129 Hypertensive chronic kidney disease with stage 1 through stage 4 chronic kidney disease, or unspecified chronic kidney disease: Secondary | ICD-10-CM | POA: Diagnosis not present

## 2014-12-09 DIAGNOSIS — Z853 Personal history of malignant neoplasm of breast: Secondary | ICD-10-CM | POA: Insufficient documentation

## 2014-12-09 DIAGNOSIS — G8929 Other chronic pain: Secondary | ICD-10-CM | POA: Diagnosis not present

## 2014-12-09 DIAGNOSIS — N183 Chronic kidney disease, stage 3 (moderate): Secondary | ICD-10-CM | POA: Diagnosis not present

## 2014-12-09 DIAGNOSIS — Z85038 Personal history of other malignant neoplasm of large intestine: Secondary | ICD-10-CM | POA: Diagnosis not present

## 2014-12-09 DIAGNOSIS — M545 Low back pain, unspecified: Secondary | ICD-10-CM

## 2014-12-09 DIAGNOSIS — Z8719 Personal history of other diseases of the digestive system: Secondary | ICD-10-CM | POA: Diagnosis not present

## 2014-12-09 MED ORDER — PREDNISONE 20 MG PO TABS
60.0000 mg | ORAL_TABLET | Freq: Once | ORAL | Status: AC
  Administered 2014-12-09: 60 mg via ORAL
  Filled 2014-12-09: qty 3

## 2014-12-09 MED ORDER — PREDNISONE 20 MG PO TABS: 20.0000 mg | ORAL_TABLET | Freq: Two times a day (BID) | ORAL | Status: DC

## 2014-12-09 NOTE — ED Provider Notes (Signed)
CSN: [id number]     Arrival date & time 12/09/14  1830 History  This chart was scribed for Holly Bo, MD by Rayna Sexton, ED scribe. This patient was seen in room WTR7/WTR7 and the patient's care was started at 7:31 PM.   Chief Complaint  Patient presents with  . Hip Pain   The history is provided by the patient. No language interpreter was used.    HPI Comments: [name] is a 69 y.o. female, with a hx of chronic back pain, who presents to the Emergency Department complaining of constant, moderate, bilateral buttocks pain with onset a few months ago. She notes a history of chronic back pain and a SHx to her lower back. Pt notes currently taking 30 mg OxyContin every 8 hours as of yesterday and although it helps her back pain it does not alleviate her buttocks pain. She notes that she came off OxyContin for a period of time recently due to experiencing an intermittent LOC but it was determined that she had very low levels of calcium causing this issue. Pt also notes previous taking fentanyl while off of OxyContin for pain management which provided very little alleviation of her pain. She notes a worsening of symptoms when ambulating or bearing weight. She denies any other associated symptoms.   Past Medical History  Diagnosis Date  . Hypertension   . Depression   . Back pain, chronic   . Breast cancer 09/09/10  . Cancer of colon 05/24/2011  . GIB (gastrointestinal bleeding)   . SDH (subdural hematoma)   . CKD (chronic kidney disease), stage III    Past Surgical History  Procedure Laterality Date  . Back surgery    . Shoulder surgery    . Tumor removal  Tumor removed R breast  . Lumbar disc surgery    . Partial hysterectomy    . Colonoscopy  05/03/2011    Procedure: COLONOSCOPY;  Surgeon: Jeryl Columbia, MD;  Location: [facility] ENDOSCOPY;  Service: Endoscopy;  Laterality: N/A;  . Colon surgery  04/2011  . Breast lumpectomy  10/10/2010    Rt Breast  . Joint replacement      R&L  total shoulder replacements   Family History  Problem Relation Age of Onset  . Diabetes Mother   . Hypertension Mother   . Cancer Mother     leukemia  . Sudden death Mother   . Anesthesia problems Neg Hx   . Hypotension Neg Hx   . Malignant hyperthermia Neg Hx   . Pseudochol deficiency Neg Hx    History  Substance Use Topics  . Smoking status: Former Smoker -- 1.00 packs/day    Types: Cigarettes  . Smokeless tobacco: Former Systems developer    Quit date: 04/22/2010  . Alcohol Use: No   OB History    No data available     Review of Systems  Musculoskeletal: Positive for myalgias and back pain.  All other systems reviewed and are negative.  Allergies  Review of patient's allergies indicates no known allergies.  Home Medications   Prior to Admission medications   Medication Sig Start Date End Date Taking? Authorizing Provider  ALPRAZolam Duanne Moron) 0.5 MG tablet Take 0.5 tablets (0.25 mg total) by mouth at bedtime. 11/05/14   Thurnell Lose, MD  anastrozole (ARIMIDEX) 1 MG tablet Take 1 tablet (1 mg total) by mouth at bedtime. 05/20/14   Truitt Merle, MD  buPROPion (WELLBUTRIN XL) 150 MG 24 hr tablet Take 150 mg by mouth  daily.    Historical Provider, MD  docusate sodium (COLACE) 100 MG capsule Take 200 mg by mouth at bedtime.    Historical Provider, MD  fentaNYL (DURAGESIC - DOSED MCG/HR) 25 MCG/HR patch Place 2 patches (50 mcg total) onto the skin every 3 (three) days. 11/05/14   Thurnell Lose, MD  fluvoxaMINE (LUVOX) 50 MG tablet Take 50-100 mg by mouth 2 (two) times daily. She takes one tablet in the morning and two tablets in the evening.    Historical Provider, MD  furosemide (LASIX) 20 MG tablet Take 20 mg by mouth daily.    Historical Provider, MD  hydrALAZINE (APRESOLINE) 10 MG tablet Take 1 tablet (10 mg total) by mouth 3 (three) times daily. 10/30/14   Theodis Blaze, MD  potassium chloride SA (K-DUR,KLOR-CON) 20 MEQ tablet Take 1 tablet (20 mEq total) by mouth daily. 10/30/14    Theodis Blaze, MD  predniSONE (DELTASONE) 20 MG tablet Take 1 tablet (20 mg total) by mouth 2 (two) times daily. 12/09/14   Holly Bo, MD  rOPINIRole (REQUIP) 0.5 MG tablet Take 0.5 mg by mouth at bedtime as needed ("restlessness").     Historical Provider, MD  traZODone (DESYREL) 50 MG tablet Take 1 tablet by mouth at bedtime. 10/16/14   Historical Provider, MD   BP 136/52 mmHg  Pulse 86  Temp(Src) 99.2 F (37.3 C) (Oral)  Resp 18  SpO2 97% Physical Exam  Constitutional: She is oriented to person, place, and time. She appears well-developed and well-nourished.  HENT:  Head: Normocephalic and atraumatic.  Eyes: Conjunctivae and EOM are normal. Pupils are equal, round, and reactive to light.  Neck: Normal range of motion and phonation normal. Neck supple.  Cardiovascular: Normal rate and regular rhythm.   Pulmonary/Chest: Effort normal and breath sounds normal. She exhibits no tenderness.  Abdominal: Soft. She exhibits no distension. There is no tenderness. There is no guarding.  Neurological: She is alert and oriented to person, place, and time. She exhibits normal muscle tone.  Skin: Skin is warm and dry.  Psychiatric: She has a normal mood and affect. Her behavior is normal. Judgment and thought content normal.  Nursing note and vitals reviewed.  ED Course  Procedures  DIAGNOSTIC STUDIES: Oxygen Saturation is 98% on RA, normal by my interpretation.    COORDINATION OF CARE: 7:38 PM Discussed treatment plan with pt at bedside and pt agreed to plan.  Medications  predniSONE (DELTASONE) tablet 60 mg (60 mg Oral Given 12/09/14 2021)    Patient Vitals for the past 24 hrs:  BP Temp Temp src Pulse Resp SpO2  12/09/14 2022 - - - 86 - 97 %  12/09/14 1900 (!) 136/52 mmHg 99.2 F (37.3 C) Oral 83 18 98 %    Findings discussed with the patient, all questions were answered.   Labs Review Labs Reviewed - No data to display  Imaging Review No results found.   EKG  Interpretation None      MDM   Final diagnoses:  Bilateral low back pain without sciatica   Nonspecific low back pain. No clinical evidence for true hip involvement. No clinical evidence for sciatica.  Nursing Notes Reviewed/ Care Coordinated Applicable Imaging Reviewed Interpretation of Laboratory Data incorporated into ED treatment  The patient appears reasonably screened and/or stabilized for discharge and I doubt any other medical condition or other [facility] requiring further screening, evaluation, or treatment in the ED at this time prior to discharge.  Plan: Home Medications- Prednisone;  Home Treatments- rest; return here if the recommended treatment, does not improve the symptoms; Recommended follow up- pCP or neurosurgery, when necessary.   I personally performed the services described in this documentation, which was scribed in my presence. The recorded information has been reviewed and is accurate.      Holly Bo, MD 12/10/14 1214

## 2014-12-09 NOTE — Telephone Encounter (Signed)
New message     Pt returning Melinda's call.

## 2014-12-09 NOTE — Telephone Encounter (Signed)
Left message to call back  

## 2014-12-09 NOTE — ED Notes (Signed)
Pt complains of pain in both hips for the past several months. Pt states the pain became worse this week. Pt denies injury. Pt takes oxycodone for her back, which she says has not touched her hip pain.

## 2014-12-09 NOTE — Telephone Encounter (Signed)
Suan Halter at 12/09/2014 9:02 AM     Status: Signed       Expand All Collapse All   New message     Pt returning Melinda's call.

## 2014-12-09 NOTE — Telephone Encounter (Signed)
This encounter was created in error - please disregard.

## 2014-12-09 NOTE — ED Notes (Signed)
Pt reports she has a cramp in her leg and would like MD Eulis Foster to be aware. MD Eulis Foster made aware.

## 2014-12-09 NOTE — Discharge Instructions (Signed)

## 2014-12-15 NOTE — Telephone Encounter (Signed)
Advised patient of results.  

## 2014-12-21 ENCOUNTER — Other Ambulatory Visit: Payer: Self-pay | Admitting: Family

## 2014-12-21 DIAGNOSIS — G629 Polyneuropathy, unspecified: Secondary | ICD-10-CM

## 2015-01-11 ENCOUNTER — Ambulatory Visit
Admission: RE | Admit: 2015-01-11 | Discharge: 2015-01-11 | Disposition: A | Discharge status: Home / Self Care | Payer: Medicare Other | Source: Ambulatory Visit | Attending: Family | Admitting: Family

## 2015-01-11 DIAGNOSIS — G629 Polyneuropathy, unspecified: Secondary | ICD-10-CM

## 2015-04-02 ENCOUNTER — Other Ambulatory Visit (HOSPITAL_COMMUNITY): Payer: Self-pay | Admitting: Anesthesiology

## 2015-04-02 ENCOUNTER — Ambulatory Visit (HOSPITAL_COMMUNITY)
Admission: RE | Admit: 2015-04-02 | Discharge: 2015-04-02 | Disposition: A | Discharge status: Home / Self Care | Payer: Medicare Other | Source: Ambulatory Visit | Attending: Anesthesiology | Admitting: Anesthesiology

## 2015-04-02 DIAGNOSIS — M533 Sacrococcygeal disorders, not elsewhere classified: Secondary | ICD-10-CM

## 2015-04-02 DIAGNOSIS — M8580 Other specified disorders of bone density and structure, unspecified site: Secondary | ICD-10-CM | POA: Insufficient documentation

## 2015-04-02 DIAGNOSIS — Z981 Arthrodesis status: Secondary | ICD-10-CM | POA: Insufficient documentation

## 2015-04-13 ENCOUNTER — Emergency Department (HOSPITAL_COMMUNITY): Payer: Medicare Other

## 2015-04-13 ENCOUNTER — Encounter (HOSPITAL_COMMUNITY): Payer: Self-pay

## 2015-04-13 ENCOUNTER — Emergency Department (HOSPITAL_COMMUNITY)
Admission: EM | Admit: 2015-04-13 | Discharge: 2015-04-14 | Disposition: A | Discharge status: Home / Self Care | Payer: Medicare Other | Attending: Emergency Medicine | Admitting: Emergency Medicine

## 2015-04-13 DIAGNOSIS — G8929 Other chronic pain: Secondary | ICD-10-CM | POA: Insufficient documentation

## 2015-04-13 DIAGNOSIS — N183 Chronic kidney disease, stage 3 (moderate): Secondary | ICD-10-CM | POA: Diagnosis not present

## 2015-04-13 DIAGNOSIS — R2243 Localized swelling, mass and lump, lower limb, bilateral: Secondary | ICD-10-CM | POA: Diagnosis present

## 2015-04-13 DIAGNOSIS — M25552 Pain in left hip: Secondary | ICD-10-CM | POA: Insufficient documentation

## 2015-04-13 DIAGNOSIS — R1900 Intra-abdominal and pelvic swelling, mass and lump, unspecified site: Secondary | ICD-10-CM | POA: Diagnosis not present

## 2015-04-13 DIAGNOSIS — I129 Hypertensive chronic kidney disease with stage 1 through stage 4 chronic kidney disease, or unspecified chronic kidney disease: Secondary | ICD-10-CM | POA: Insufficient documentation

## 2015-04-13 DIAGNOSIS — R0602 Shortness of breath: Secondary | ICD-10-CM | POA: Insufficient documentation

## 2015-04-13 DIAGNOSIS — F17210 Nicotine dependence, cigarettes, uncomplicated: Secondary | ICD-10-CM | POA: Diagnosis not present

## 2015-04-13 DIAGNOSIS — Z79891 Long term (current) use of opiate analgesic: Secondary | ICD-10-CM | POA: Insufficient documentation

## 2015-04-13 DIAGNOSIS — F329 Major depressive disorder, single episode, unspecified: Secondary | ICD-10-CM | POA: Diagnosis not present

## 2015-04-13 DIAGNOSIS — Z853 Personal history of malignant neoplasm of breast: Secondary | ICD-10-CM | POA: Insufficient documentation

## 2015-04-13 DIAGNOSIS — K5900 Constipation, unspecified: Secondary | ICD-10-CM

## 2015-04-13 DIAGNOSIS — Z85038 Personal history of other malignant neoplasm of large intestine: Secondary | ICD-10-CM | POA: Insufficient documentation

## 2015-04-13 DIAGNOSIS — Z9889 Other specified postprocedural states: Secondary | ICD-10-CM | POA: Diagnosis not present

## 2015-04-13 DIAGNOSIS — R600 Localized edema: Secondary | ICD-10-CM | POA: Diagnosis not present

## 2015-04-13 DIAGNOSIS — R635 Abnormal weight gain: Secondary | ICD-10-CM | POA: Diagnosis not present

## 2015-04-13 DIAGNOSIS — Z79899 Other long term (current) drug therapy: Secondary | ICD-10-CM | POA: Insufficient documentation

## 2015-04-13 DIAGNOSIS — F419 Anxiety disorder, unspecified: Secondary | ICD-10-CM | POA: Insufficient documentation

## 2015-04-13 DIAGNOSIS — L988 Other specified disorders of the skin and subcutaneous tissue: Secondary | ICD-10-CM | POA: Insufficient documentation

## 2015-04-13 LAB — COMPREHENSIVE METABOLIC PANEL
ALT: 17 U/L (ref 14–54)
AST: 28 U/L (ref 15–41)
Albumin: 3.9 g/dL (ref 3.5–5.0)
Alkaline Phosphatase: 98 U/L (ref 38–126)
Anion gap: 10 (ref 5–15)
BUN: 16 mg/dL (ref 6–20)
CO2: 26 mmol/L (ref 22–32)
Calcium: 9.8 mg/dL (ref 8.9–10.3)
Chloride: 104 mmol/L (ref 101–111)
Creatinine, Ser: 1.08 mg/dL — ABNORMAL HIGH (ref 0.44–1.00)
GFR calc Af Amer: 60 mL/min — ABNORMAL LOW (ref >60)
GFR calc non Af Amer: 52 mL/min — ABNORMAL LOW (ref >60)
Glucose, Bld: 84 mg/dL (ref 65–99)
Potassium: 3.4 mmol/L — ABNORMAL LOW (ref 3.5–5.1)
Sodium: 140 mmol/L (ref 135–145)
Total Bilirubin: 0.4 mg/dL (ref 0.3–1.2)
Total Protein: 6.9 g/dL (ref 6.5–8.1)

## 2015-04-13 LAB — CBC WITH DIFFERENTIAL/PLATELET
BASOS PCT: 1 %
Basophils Absolute: 0.1 10*3/uL (ref 0.0–0.1)
EOS PCT: 1 %
Eosinophils Absolute: 0.1 10*3/uL (ref 0.0–0.7)
HCT: 27.8 % — ABNORMAL LOW (ref 36.0–46.0)
HEMOGLOBIN: 8.3 g/dL — AB (ref 12.0–15.0)
Lymphocytes Relative: 26 %
Lymphs Abs: 1.6 10*3/uL (ref 0.7–4.0)
MCH: 21.8 pg — ABNORMAL LOW (ref 26.0–34.0)
MCHC: 29.9 g/dL — AB (ref 30.0–36.0)
MCV: 73 fL — AB (ref 78.0–100.0)
MONO ABS: 0.7 10*3/uL (ref 0.1–1.0)
Monocytes Relative: 12 %
NEUTROS ABS: 3.6 10*3/uL (ref 1.7–7.7)
Neutrophils Relative %: 60 %
PLATELETS: 401 10*3/uL — AB (ref 150–400)
RBC: 3.81 MIL/uL — ABNORMAL LOW (ref 3.87–5.11)
RDW: 18.5 % — ABNORMAL HIGH (ref 11.5–15.5)
WBC: 6.1 10*3/uL (ref 4.0–10.5)

## 2015-04-13 LAB — URINALYSIS, ROUTINE W REFLEX MICROSCOPIC
Bilirubin Urine: NEGATIVE
Glucose, UA: NEGATIVE mg/dL
Hgb urine dipstick: NEGATIVE
Ketones, ur: NEGATIVE mg/dL
Leukocytes, UA: NEGATIVE
Nitrite: NEGATIVE
Protein, ur: NEGATIVE mg/dL
Specific Gravity, Urine: 1.014 (ref 1.005–1.030)
pH: 6.5 (ref 5.0–8.0)

## 2015-04-13 LAB — D-DIMER, QUANTITATIVE (NOT AT ARMC): D DIMER QUANT: 0.93 ug{FEU}/mL — AB (ref 0.00–0.50)

## 2015-04-13 LAB — TROPONIN I: Troponin I: 0.03 ng/mL (ref <0.031)

## 2015-04-13 LAB — BRAIN NATRIURETIC PEPTIDE: B Natriuretic Peptide: 233.6 pg/mL — ABNORMAL HIGH (ref 0.0–100.0)

## 2015-04-13 MED ORDER — OXYCODONE HCL ER 10 MG PO T12A
60.0000 mg | EXTENDED_RELEASE_TABLET | Freq: Two times a day (BID) | ORAL | Status: DC
  Administered 2015-04-13: 60 mg via ORAL
  Filled 2015-04-13: qty 6

## 2015-04-13 NOTE — ED Notes (Signed)
Pt in hallway, has spoken to Dr. Rex Kras. Pt requesting ice chips.

## 2015-04-13 NOTE — ED Notes (Signed)
Spoke with Dr. Rex Kras, per pt request for pain medication. New order given.

## 2015-04-13 NOTE — ED Notes (Signed)
Pt has transported to XR and back to room. Requesting pain med.

## 2015-04-13 NOTE — ED Provider Notes (Signed)
CSN: [id number]     Arrival date & time 04/13/15  1939 History   First MD Initiated Contact with Patient 04/13/15 2001     Chief Complaint  Patient presents with  . Leg Swelling     (Consider location/radiation/quality/duration/timing/severity/associated sxs/prior Treatment) HPI Comments: 69 year old female past medical history including hypertension, breast cancer, colon cancer, CK D, chronic back pain on opiates who presents with lower extremity edema and shortness of breath. Patient reports a long-standing history of bilateral lower extremity edema. She reports that recently it has gotten worse despite wearing compression stockings. She has noted an 8 pound weight gain in the past one month. She has had several weeks of shortness of breath which is much worse with any exertion. She denied any recent chest pain but then stated that she later has had some "pulsating and with pulling" pain in her central chest which is random and lasts a few seconds. She endorses full body swelling including in her groin and some weeping blisters on her legs. She denies any fevers, cough/cold symptoms, or recent illness. No blood in her stool. She reports chronic L hip pain that has recently moved into R hip.  The history is provided by the patient.    Past Medical History  Diagnosis Date  . Hypertension   . Depression   . Back pain, chronic   . Breast cancer ([place]) 09/09/10  . Cancer of colon ([place]) 05/24/2011  . GIB (gastrointestinal bleeding)   . SDH (subdural hematoma) ([place])   . CKD (chronic kidney disease), stage III    Past Surgical History  Procedure Laterality Date  . Back surgery    . Shoulder surgery    . Tumor removal  Tumor removed R breast  . Lumbar disc surgery    . Partial hysterectomy    . Colonoscopy  05/03/2011    Procedure: COLONOSCOPY;  Surgeon: Jeryl Columbia, MD;  Location: [facility] ENDOSCOPY;  Service: Endoscopy;  Laterality: N/A;  . Colon surgery  04/2011  . Breast lumpectomy   10/10/2010    Rt Breast  . Joint replacement      R&L total shoulder replacements   Family History  Problem Relation Age of Onset  . Diabetes Mother   . Hypertension Mother   . Cancer Mother     leukemia  . Sudden death Mother   . Anesthesia problems Neg Hx   . Hypotension Neg Hx   . Malignant hyperthermia Neg Hx   . Pseudochol deficiency Neg Hx    Social History  Substance Use Topics  . Smoking status: Current Every Day Smoker -- 0.75 packs/day    Types: Cigarettes  . Smokeless tobacco: Former Systems developer    Quit date: 04/22/2010  . Alcohol Use: No   OB History    No data available     Review of Systems    Allergies  Review of patient's allergies indicates no known allergies.  Home Medications   Prior to Admission medications   Medication Sig Start Date End Date Taking? Authorizing Provider  ALPRAZolam Duanne Moron) 0.5 MG tablet Take 0.5 tablets (0.25 mg total) by mouth at bedtime. Patient taking differently: Take 1 mg by mouth 3 (three) times daily as needed for anxiety.  11/05/14  Yes Thurnell Lose, MD  anastrozole (ARIMIDEX) 1 MG tablet Take 1 tablet (1 mg total) by mouth at bedtime. 05/20/14  Yes Truitt Merle, MD  bacitracin ointment Apply 1 application topically as directed. For blisters.   Yes Historical Provider, MD  buPROPion (WELLBUTRIN XL) 150 MG 24 hr tablet Take 150 mg by mouth daily.   Yes Historical Provider, MD  docusate sodium (COLACE) 100 MG capsule Take 200 mg by mouth at bedtime.   Yes Historical Provider, MD  furosemide (LASIX) 20 MG tablet Take 20 mg by mouth daily.   Yes Historical Provider, MD  hydrALAZINE (APRESOLINE) 25 MG tablet Take 25 mg by mouth 2 (two) times daily.   Yes Historical Provider, MD  ibuprofen (ADVIL,MOTRIN) 200 MG tablet Take 800 mg by mouth every 4 (four) hours as needed for mild pain or moderate pain.   Yes Historical Provider, MD  oxyCODONE (OXYCONTIN) 60 MG 12 hr tablet Take 1 tablet by mouth every 8 (eight) hours.   Yes Historical  Provider, MD  potassium chloride SA (K-DUR,KLOR-CON) 20 MEQ tablet Take 1 tablet (20 mEq total) by mouth daily. Patient taking differently: Take 20 mEq by mouth 2 (two) times daily.  10/30/14  Yes Theodis Blaze, MD  rOPINIRole (REQUIP) 0.5 MG tablet Take 0.5 mg by mouth at bedtime as needed ("restlessness").    Yes Historical Provider, MD  silver sulfADIAZINE (SILVADENE) 1 % cream Apply 1 application topically as directed. For blisters.   Yes Historical Provider, MD  traZODone (DESYREL) 50 MG tablet Take 1 tablet by mouth at bedtime. 10/16/14  Yes Historical Provider, MD   BP 168/93 mmHg  Pulse 74  Temp(Src) 98.6 F (37 C) (Oral)  Resp 20  Ht 5' (1.524 m)  Wt 130 lb (58.968 kg)  BMI 25.39 kg/m2  SpO2 94% Physical Exam  Constitutional: She is oriented to person, place, and time.  Thin female, uncomfortable and tearful but NAD  HENT:  Head: Normocephalic and atraumatic.  Moist mucous membranes  Eyes: Conjunctivae are normal. Pupils are equal, round, and reactive to light.  Neck: Neck supple.  Cardiovascular: Normal rate, regular rhythm and normal heart sounds.   No murmur heard. Pulmonary/Chest: Effort normal. No respiratory distress. She has no wheezes.  Diminished breath sounds bilaterally  Abdominal: Soft. Bowel sounds are normal. She exhibits no distension. There is no tenderness.  Genitourinary:  Uniform edema of mons pubis w/ no skin changes  Musculoskeletal:  3+ pitting edema b/l LE to knees  Neurological: She is alert and oriented to person, place, and time.  Fluent speech  Skin: Skin is warm and dry. No rash noted.  Psychiatric:  Tearful, anxious  Nursing note and vitals reviewed.   ED Course  Procedures (including critical care time) Labs Review Labs Reviewed  COMPREHENSIVE METABOLIC PANEL - Abnormal; Notable for the following:    Potassium 3.4 (*)    Creatinine, Ser 1.08 (*)    GFR calc non Af Amer 52 (*)    GFR calc Af Amer 60 (*)    All other components  within normal limits  CBC WITH DIFFERENTIAL/PLATELET - Abnormal; Notable for the following:    RBC 3.81 (*)    Hemoglobin 8.3 (*)    HCT 27.8 (*)    MCV 73.0 (*)    MCH 21.8 (*)    MCHC 29.9 (*)    RDW 18.5 (*)    Platelets 401 (*)    All other components within normal limits  BRAIN NATRIURETIC PEPTIDE - Abnormal; Notable for the following:    B Natriuretic Peptide 233.6 (*)    All other components within normal limits  D-DIMER, QUANTITATIVE (NOT AT [facility]) - Abnormal; Notable for the following:    D-Dimer, Quant 0.93 (*)  All other components within normal limits  URINALYSIS, ROUTINE W REFLEX MICROSCOPIC (NOT AT [facility])  TROPONIN I    Imaging Review Dg Chest 2 View  04/13/2015  CLINICAL DATA:  Bilateral lower extremity edema with weight gain of 8 pounds over the last month. Mild shortness of breath. History of breast cancer, colon cancer, and hypertension. EXAM: CHEST  2 VIEW COMPARISON:  11/05/2014 FINDINGS: Normal heart size and pulmonary vascularity. No focal airspace disease or consolidation in the lungs. No blunting of costophrenic angles. No pneumothorax. Mediastinal contours appear intact. Surgical clips projected over the right lower lung. Calcified and tortuous aorta. Old right rib fractures. Postoperative changes with bilateral shoulder arthroplasties and posterior fixation of the lumbar spine. Degenerative changes in the thoracic spine with exaggerated thoracic kyphosis. IMPRESSION: No active cardiopulmonary disease. Electronically Signed   By: Lucienne Capers M.D.   On: 04/13/2015 21:05   I have personally reviewed and evaluated these lab results as part of my medical decision-making.   EKG Interpretation   Date/Time:  Tuesday April 13 2015 20:32:40 EST Ventricular Rate:  91 PR Interval:  145 QRS Duration: 98 QT Interval:  350 QTC Calculation: 431 R Axis:   84 Text Interpretation:  Sinus rhythm Borderline right axis deviation No  significant change since last  tracing Confirmed by LITTLE MD, RACHEL  [phone]) on 04/13/2015 8:49:55 PM     Medications  oxyCODONE (OXYCONTIN) 12 hr tablet 60 mg (60 mg Oral Given 04/13/15 2132)    MDM   Final diagnoses:  Bilateral leg edema  Shortness of breath   Patient presents with several weeks of worsening of bilateral lower extremity edema as well as shortness of breath with exertion and recent weight gain. On exam, patient was tearful but in no acute distress. Vital signs notable for hypertension. He had significant pitting edema of bilateral lower extremities and some edema of her mons pubis. EKG showed sinus rhythm with no significant change since previous. Obtained above labs which were notable for elevated d-dimer and BNP at 233. No evidence of liver or kidney process to explain edema. Chest x-ray showed no acute process. Gave pt home oxycontin for hip pain. Given patient's history of cancer, she is at risk for PE. Will obtain CTA of chest to evaluate for PE vs occult pneumonia. Pt has hx of colon cancer and ddx includes obstructing mass in pelvis causing venous congestion, therefore also obtained CT through abd and pelvis. I am signing the patient out to the oncoming provider. Her disposition will be determined by results of imaging studies.  Sharlett Iles, MD 04/13/15 [id number]

## 2015-04-13 NOTE — ED Notes (Signed)
Pt aware of CT order.

## 2015-04-13 NOTE — ED Notes (Signed)
Pt assisted to BR, ambulatory with slowed, but steady gait.

## 2015-04-13 NOTE — ED Notes (Signed)
Patient c/o bilateral lower extremity edema feet to thighs with weight gain of 8lbs over the last month.  Patient states she has mild SHOB that is markedly increased with exertion.  Patient also c/o chronic left hip pain that has worsened over the past few days.  Patient denies chest pain.

## 2015-04-14 ENCOUNTER — Emergency Department (HOSPITAL_COMMUNITY): Payer: Medicare Other

## 2015-04-14 DIAGNOSIS — R600 Localized edema: Secondary | ICD-10-CM | POA: Diagnosis not present

## 2015-04-14 MED ORDER — AZITHROMYCIN 250 MG PO TABS: ORAL_TABLET | ORAL | Status: DC

## 2015-04-14 MED ORDER — IOHEXOL 350 MG/ML SOLN: 100.0000 mL | Freq: Once | INTRAVENOUS | Status: DC | PRN

## 2015-04-14 MED ORDER — POLYETHYLENE GLYCOL 3350 17 GM/SCOOP PO POWD: 17.0000 g | Freq: Once | ORAL | Status: DC

## 2015-04-14 NOTE — ED Notes (Signed)
Pt returned to room  

## 2015-04-14 NOTE — ED Notes (Signed)
Pt given ice chips per request at this time. Dr. Rex Kras aware.

## 2015-04-14 NOTE — Discharge Instructions (Signed)

## 2015-04-14 NOTE — ED Notes (Signed)
Pt ambulated to BR at this time.

## 2015-04-14 NOTE — ED Notes (Signed)
Pt given discharge instructions and verbalized understanding of need to follow up, reasons to return to the ED, and medications to continue at home. Pt reports chronic pain remains at 10/10, but aware of need to follow up with pain management clinic. IVs removed intact. Sites clean and dry. BP remains elevated but stable. Pt denies further questions or need. Pt able to change clothes and ambulate to wheelchair. Pt then escorted to exit.

## 2015-04-23 ENCOUNTER — Other Ambulatory Visit: Payer: Self-pay | Admitting: Anesthesiology

## 2015-04-23 DIAGNOSIS — M25552 Pain in left hip: Secondary | ICD-10-CM

## 2015-04-26 ENCOUNTER — Ambulatory Visit
Admission: RE | Admit: 2015-04-26 | Discharge: 2015-04-26 | Disposition: A | Discharge status: Home / Self Care | Payer: No Typology Code available for payment source | Source: Ambulatory Visit | Attending: Anesthesiology | Admitting: Anesthesiology

## 2015-04-26 DIAGNOSIS — M25552 Pain in left hip: Secondary | ICD-10-CM

## 2015-05-07 ENCOUNTER — Other Ambulatory Visit: Payer: Self-pay

## 2015-05-19 ENCOUNTER — Other Ambulatory Visit (HOSPITAL_BASED_OUTPATIENT_CLINIC_OR_DEPARTMENT_OTHER): Payer: Medicare Other

## 2015-05-19 DIAGNOSIS — Z85038 Personal history of other malignant neoplasm of large intestine: Secondary | ICD-10-CM

## 2015-05-19 DIAGNOSIS — C50919 Malignant neoplasm of unspecified site of unspecified female breast: Secondary | ICD-10-CM | POA: Diagnosis not present

## 2015-05-19 LAB — COMPREHENSIVE METABOLIC PANEL
ALT: 12 U/L (ref 0–55)
AST: 20 U/L (ref 5–34)
Albumin: 3.5 g/dL (ref 3.5–5.0)
Alkaline Phosphatase: 103 U/L (ref 40–150)
Anion Gap: 10 mEq/L (ref 3–11)
BUN: 14.5 mg/dL (ref 7.0–26.0)
CO2: 28 meq/L (ref 22–29)
CREATININE: 1.5 mg/dL — AB (ref 0.6–1.1)
Calcium: 9.6 mg/dL (ref 8.4–10.4)
Chloride: 103 mEq/L (ref 98–109)
EGFR: 36 mL/min/{1.73_m2} — ABNORMAL LOW (ref >90)
GLUCOSE: 91 mg/dL (ref 70–140)
Potassium: 3.7 mEq/L (ref 3.5–5.1)
SODIUM: 141 meq/L (ref 136–145)
TOTAL PROTEIN: 6.7 g/dL (ref 6.4–8.3)

## 2015-05-19 LAB — CBC WITH DIFFERENTIAL/PLATELET
BASO%: 0.9 % (ref 0.0–2.0)
Basophils Absolute: 0 10*3/uL (ref 0.0–0.1)
EOS ABS: 0.1 10*3/uL (ref 0.0–0.5)
EOS%: 1.3 % (ref 0.0–7.0)
HEMATOCRIT: 28.3 % — AB (ref 34.8–46.6)
HGB: 8.5 g/dL — ABNORMAL LOW (ref 11.6–15.9)
LYMPH#: 1.4 10*3/uL (ref 0.9–3.3)
LYMPH%: 29.7 % (ref 14.0–49.7)
MCH: 20.3 pg — ABNORMAL LOW (ref 25.1–34.0)
MCHC: 29.9 g/dL — ABNORMAL LOW (ref 31.5–36.0)
MCV: 68 fL — AB (ref 79.5–101.0)
MONO#: 0.5 10*3/uL (ref 0.1–0.9)
MONO%: 10.8 % (ref 0.0–14.0)
NEUT#: 2.7 10*3/uL (ref 1.5–6.5)
NEUT%: 57.3 % (ref 38.4–76.8)
PLATELETS: 322 10*3/uL (ref 145–400)
RBC: 4.17 10*6/uL (ref 3.70–5.45)
RDW: 17.5 % — ABNORMAL HIGH (ref 11.2–14.5)
WBC: 4.7 10*3/uL (ref 3.9–10.3)

## 2015-05-20 LAB — CEA: CEA: 3.6 ng/mL (ref 0.0–5.0)

## 2015-05-24 ENCOUNTER — Encounter: Payer: Self-pay | Admitting: Hematology

## 2015-05-24 NOTE — Progress Notes (Signed)
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 At her final pathology revealed a low grade invasive ductal carcinoma with with 0 of 2 lymph nodes positive for metastatic disease. The tumor was ER positive PR positive HER-2/neu negative.  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 Post lumpectomy patient went on to receive radiation therapy from 12/28/2010 2 01/18/2011 to the right breast. She thereafter received single agent Arimidex 1 mg daily starting on 02/06/2011.  #2 05/01/2011 patient developed generalized weakness shortness of breath and syncopal episode. She had a CT of the head performed that was negative. She had CBC done with that showed a hemoglobin of 6.7. She received 3 units of packed red cells. She was severely iron deficient. FOB was positive. Patient had a GI consult performed during her hospitalization and on 12/12 she underwent a colonoscopy that revealed a near obstructing colonic mass worrisome for a hepatic flexure tumor. She had biopsies performed that revealed an adenocarcinoma. She went on to have a right segmental resection performed on 05/05/2011. The final pathology revealed a 5.0 cm poorly differentiated high grade invasive adenocarcinoma with patchy mucinous features invading  through the muscularis propria into pericolonic fat he tissue 34 lymph nodes were negative for metastatic disease. (T3 N0)  CURRENT THERAPY:  1. Breast cancer: arimidex, started on 01/27/2011 2. Colon Cancer: observation  INTERVAL HISTORY: [name] 70 y.o. female with history of breast and colon cancer returns for followup visit today. She complains of little appetite for the past a few years, she eats small meals, no significant weight loss. She has worsening left hip pain for the past 1 year, and has been seeing Consulting civil engineer. No other new complaints.  MEDICAL HISTORY: Past Medical History  Diagnosis Date  . Hypertension   . Depression   . Back pain, chronic   . Breast cancer ([place]) 09/09/10  . Cancer of colon ([place]) 05/24/2011  . GIB (gastrointestinal bleeding)   . SDH (subdural hematoma) ([place])   . CKD (chronic kidney disease), stage III     ALLERGIES:  has No Known Allergies.  MEDICATIONS:  Current Outpatient Prescriptions  Medication Sig Dispense Refill  . ALPRAZolam (XANAX) 0.5 MG tablet Take 0.5 tablets (0.25 mg total) by mouth at bedtime. (Patient taking differently: Take 1 mg by mouth 3 (three) times daily as needed for anxiety. ) 30 tablet 0  . anastrozole (ARIMIDEX) 1 MG tablet Take 1 tablet (1 mg total) by mouth at bedtime. 90 tablet 3  . azithromycin (ZITHROMAX Z-PAK) 250 MG tablet 2 po day one, then 1 daily x 4 days 5 tablet 0  . bacitracin ointment Apply 1 application topically as directed. For blisters.    Marland Kitchen buPROPion (WELLBUTRIN XL) 150 MG 24 hr tablet Take 150 mg by mouth daily.    Marland Kitchen docusate sodium (COLACE) 100 MG capsule Take 200 mg by  mouth at bedtime.    . furosemide (LASIX) 20 MG tablet Take 20 mg by mouth daily.    . hydrALAZINE (APRESOLINE) 25 MG tablet Take 25 mg by mouth 2 (two) times daily.    Marland Kitchen ibuprofen (ADVIL,MOTRIN) 200 MG tablet Take 800 mg by mouth every 4 (four) hours as needed for mild pain or moderate pain.    Marland Kitchen oxyCODONE (OXYCONTIN) 60  MG 12 hr tablet Take 1 tablet by mouth every 8 (eight) hours.    . polyethylene glycol powder (MIRALAX) powder Take 17 g by mouth once. 255 g 0  . potassium chloride SA (K-DUR,KLOR-CON) 20 MEQ tablet Take 1 tablet (20 mEq total) by mouth daily. (Patient taking differently: Take 20 mEq by mouth 2 (two) times daily. ) 30 tablet 0  . rOPINIRole (REQUIP) 0.5 MG tablet Take 0.5 mg by mouth at bedtime as needed ("restlessness").     . silver sulfADIAZINE (SILVADENE) 1 % cream Apply 1 application topically as directed. For blisters.    . traZODone (DESYREL) 50 MG tablet Take 1 tablet by mouth at bedtime.     No current facility-administered medications for this visit.    SURGICAL HISTORY:  Past Surgical History  Procedure Laterality Date  . Back surgery    . Shoulder surgery    . Tumor removal  Tumor removed R breast  . Lumbar disc surgery    . Partial hysterectomy    . Colonoscopy  05/03/2011    Procedure: COLONOSCOPY;  Surgeon: Jeryl Columbia, MD;  Location: [facility] ENDOSCOPY;  Service: Endoscopy;  Laterality: N/A;  . Colon surgery  04/2011  . Breast lumpectomy  10/10/2010    Rt Breast  . Joint replacement      R&L total shoulder replacements    REVIEW OF SYSTEMS:  Constitutional: Denies fevers, chills or abnormal weight loss Eyes: Denies blurriness of vision Ears, nose, mouth, throat, and face: Denies mucositis or sore throat Respiratory: Denies cough, dyspnea or wheezes Cardiovascular: Denies palpitation, chest discomfort or lower extremity swelling Gastrointestinal:  Denies nausea, heartburn or change in bowel habits, (+) low appetite  Skin: Denies abnormal skin rashes Lymphatics: Denies new lymphadenopathy or easy bruising Neuromuscular:Denies numbness, tingling or new weaknesses, (+) left hip pain  Behavioral/Psych: Mood is stable, no new changes  All other systems were reviewed with the patient and are negative.  PHYSICAL EXAMINATION:  ECOG PERFORMANCE STTUS: 1 - Symptomatic but  completely ambulatory Blood pressure 183/80, pulse 76, temperature 98 F (36.7 C), temperature source Oral, resp. rate 18, height 5' (1.524 m), SpO2 100 %. Patient is awake alert in no acute distress.  HEENT exam: EOMI PERRLA sclerae anicteric no conjunctival pallor oral mucosa is moist. Neck: Supple no palpable cervical supraclavicular or axillary adenopathy. Lungs are clear bilaterally to auscultation and percussion cardiovascular Cardiovascular: Regular rate rhythm no murmurs gallops or rubs. Abdomen is soft nontender nondistended bowel sounds are present well-healed surgical scar. There is no hepatosplenomegaly. Extremities Mild edema there is noted to be some tenderness bilaterally in the calf. Neuro patient's alert oriented x3 otherwise nonfocal. Patient does has considerable amount of anxiety Right breast still remains erythematous but it is less swollen in comparison to my exam while she was hospitalized. There is no nipple discharge no inversion or retraction. Left breast no masses or nipple discharge  LABORATORY DATA: CBC Latest Ref Rng 05/19/2015 04/13/2015 11/05/2014  WBC 3.9 - 10.3 10e3/uL 4.7 6.1 3.7(L)  Hemoglobin 11.6 - 15.9 g/dL 8.5(L) 8.3(L) 11.0(L)  Hematocrit 34.8 -  46.6 % 28.3(L) 27.8(L) 35.6(L)  Platelets 145 - 400 10e3/uL 322 401(H) 235    CMP Latest Ref Rng 05/19/2015 04/13/2015 11/05/2014  Glucose 70 - 140 mg/dl 91 84 85  BUN 7.0 - 26.0 mg/dL 14.5 16 37(H)  Creatinine 0.6 - 1.1 mg/dL 1.5(H) 1.08(H) 1.31(H)  Sodium 136 - 145 mEq/L 141 140 142  Potassium 3.5 - 5.1 mEq/L 3.7 3.4(L) 4.3  Chloride 101 - 111 mmol/L - 104 109  CO2 22 - 29 mEq/L '28 26 25  ' Calcium 8.4 - 10.4 mg/dL 9.6 9.8 9.7  Total Protein 6.4 - 8.3 g/dL 6.7 6.9 6.4(L)  Total Bilirubin 0.20 - 1.20 mg/dL <0.30 0.4 0.5  Alkaline Phos 40 - 150 U/L 103 98 83  AST 5 - 34 U/L 20 28 39  ALT 0 - 55 U/L '12 17 29   ' CEA  Status: Finalresult Visible to patient:  Not Released Nextappt: 06/24/2015 at  01:00 PM in Oncology ([facility]) Dx:  History of colon cancer           Ref Range 10d ago (05/19/15) 87yrago (09/25/11) 481yrgo (05/04/11) 4y42yro (05/03/11)    CEA 0.0 - 5.0 ng/mL 3.6 2.1 2.2 2.4           ASSESSMENT AND PLAN: 69 15ar old female with 2 primary malignancies:  1.  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 History of stage II adenocarcinoma of the right colon  -status post segmental resection in 04/2011 -repeated CEA levels have been normal -she had CT CAP  With contrast for dyspnea and  Leg swelling,  Scan was negative for recurrent disease. - she has been 4 years out of the initial surgery, the risk of recurrence has significantly decreased. - given the  Worsening anemia , probable iron deficient anemia, I strongly encouraged her to repeat colonoscopy, pt is very reluctant, will think about it. She has not had repeated colonoscopy since her initial diagnosis of colon cancer  3. Worsening anemia -Her Hb dropped to 8.5 lately, no clinical over  Bleeding - I'll check serum iron , TIBC, ferritin, B2, folic acid, ESA level  -Given the low MCV, this is likely  Iron deficient anemia. If confirms. I'll set uhe IV ferheme  4. HTN, anorexia, anxiety and depression leg swelling -She will continue follow up with her PCP   PLAN:  -lab today, I will call her with result.  -If iron deficient, will set up iv feraheme  -I will see her back in one month, I will encourage her to consider colonoscopy again   All questions were answered. The patient knows to call the clinic with any problems, questions or concerns. We can  certainly see the patient much sooner if necessary.  I spent 30 minutes counseling the patient face to face. The total time spent in the appointment was 40 minutes.   FenTruitt Merle3/2017

## 2015-05-25 ENCOUNTER — Ambulatory Visit (HOSPITAL_BASED_OUTPATIENT_CLINIC_OR_DEPARTMENT_OTHER): Payer: Medicare Other | Admitting: Hematology

## 2015-05-25 ENCOUNTER — Ambulatory Visit (HOSPITAL_BASED_OUTPATIENT_CLINIC_OR_DEPARTMENT_OTHER): Payer: Medicare Other

## 2015-05-25 ENCOUNTER — Telehealth: Payer: Self-pay | Admitting: Hematology

## 2015-05-25 VITALS — BP 183/80 | HR 76 | Temp 98.0°F | Resp 18 | Ht 60.0 in

## 2015-05-25 DIAGNOSIS — C50919 Malignant neoplasm of unspecified site of unspecified female breast: Secondary | ICD-10-CM | POA: Diagnosis not present

## 2015-05-25 DIAGNOSIS — D649 Anemia, unspecified: Secondary | ICD-10-CM

## 2015-05-25 LAB — RETICULOCYTES (CHCC)
IMMATURE RETIC FRACT: 9.8 % (ref 1.60–10.00)
RBC: 4.18 10*6/uL (ref 3.70–5.45)
Retic %: 1.36 % (ref 0.70–2.10)
Retic Ct Abs: 56.85 10*3/uL (ref 33.70–90.70)

## 2015-05-25 NOTE — Telephone Encounter (Signed)
Per patient, Dr. Phill Myron URGENT ADD-ON LAB. Scheduled for 3:30pm TODAY.       [facility].

## 2015-05-26 ENCOUNTER — Telehealth: Payer: Self-pay | Admitting: Hematology

## 2015-05-26 LAB — IRON AND TIBC
%SAT: 3 % — ABNORMAL LOW (ref 21–57)
IRON: 14 ug/dL — AB (ref 41–142)
TIBC: 483 ug/dL — AB (ref 236–444)
UIBC: 468 ug/dL — AB (ref 120–384)

## 2015-05-26 LAB — FERRITIN: FERRITIN: 19 ng/mL (ref 9–269)

## 2015-05-26 NOTE — Telephone Encounter (Signed)
per pof to sch pt appt-isent Dr Burr Medico staff message to put mamma order in-willc all pt after reply

## 2015-05-27 LAB — FOLATE RBC: RBC FOLATE: 488 ng/mL (ref >280)

## 2015-05-27 LAB — CANCER ANTIGEN 27.29: CA 27.29: 20 U/mL (ref 0–39)

## 2015-05-27 LAB — ERYTHROPOIETIN: Erythropoietin: 141.5 m[IU]/mL — ABNORMAL HIGH (ref 2.6–18.5)

## 2015-05-27 LAB — METHYLMALONIC ACID, SERUM: METHYLMALONIC ACID, QUANT: 282 nmol/L (ref 87–318)

## 2015-05-27 LAB — SEDIMENTATION RATE: Sed Rate: 4 mm/hr (ref 0–30)

## 2015-05-27 LAB — VITAMIN B12: VITAMIN B 12: 237 pg/mL (ref 211–911)

## 2015-05-30 ENCOUNTER — Telehealth: Payer: Self-pay | Admitting: Hematology

## 2015-05-30 NOTE — Telephone Encounter (Signed)
Called patient and she was unaware that she will need iron.  Advised her that a nurse will be in contact with her 1/9   anne

## 2015-05-31 ENCOUNTER — Telehealth: Payer: Self-pay | Admitting: *Deleted

## 2015-05-31 NOTE — Telephone Encounter (Signed)
Spoke with pt and informed pt re:  Pt has iron deficiency anemia.  Dr. Burr Medico would like for pt to receive IV Iron Feraheme weekly x 2.   Gave pt phone number to [facility], scheduler to coordinate times with pt.  Pt voiced understanding.

## 2015-05-31 NOTE — Telephone Encounter (Signed)
Per patient request I have moved appts to the afternoon. Patient aware

## 2015-05-31 NOTE — Telephone Encounter (Signed)
Attempted to call pt unsuccessfully.   Left message on voice mail requesting a call back to collaborative nurse today.

## 2015-06-04 ENCOUNTER — Other Ambulatory Visit: Payer: Self-pay | Admitting: Hematology

## 2015-06-04 DIAGNOSIS — D509 Iron deficiency anemia, unspecified: Secondary | ICD-10-CM

## 2015-06-07 ENCOUNTER — Ambulatory Visit (HOSPITAL_BASED_OUTPATIENT_CLINIC_OR_DEPARTMENT_OTHER): Payer: Medicare Other

## 2015-06-07 ENCOUNTER — Telehealth: Payer: Self-pay | Admitting: Hematology

## 2015-06-07 VITALS — BP 178/63 | HR 92 | Temp 98.0°F | Resp 18

## 2015-06-07 DIAGNOSIS — N183 Chronic kidney disease, stage 3 (moderate): Secondary | ICD-10-CM | POA: Diagnosis not present

## 2015-06-07 DIAGNOSIS — D509 Iron deficiency anemia, unspecified: Secondary | ICD-10-CM | POA: Diagnosis not present

## 2015-06-07 MED ORDER — SODIUM CHLORIDE 0.9 % IV SOLN
510.0000 mg | Freq: Once | INTRAVENOUS | Status: AC
  Administered 2015-06-07: 510 mg via INTRAVENOUS
  Filled 2015-06-07: qty 17

## 2015-06-07 MED ORDER — SODIUM CHLORIDE 0.9 % IV SOLN
Freq: Once | INTRAVENOUS | Status: AC
  Administered 2015-06-07: 14:00:00 via INTRAVENOUS

## 2015-06-07 NOTE — Telephone Encounter (Signed)
per pof to sch pt mamma-cld pt and gave pt time/date/location of appt

## 2015-06-08 ENCOUNTER — Telehealth: Payer: Self-pay | Admitting: Hematology

## 2015-06-08 NOTE — Telephone Encounter (Signed)
pt cal and left voicemail-cld to r/s-cld pt back and gave r/s time & date

## 2015-06-14 ENCOUNTER — Ambulatory Visit (HOSPITAL_BASED_OUTPATIENT_CLINIC_OR_DEPARTMENT_OTHER): Payer: Medicare Other

## 2015-06-14 VITALS — BP 170/72 | HR 71 | Temp 98.0°F | Resp 18

## 2015-06-14 DIAGNOSIS — D509 Iron deficiency anemia, unspecified: Secondary | ICD-10-CM

## 2015-06-14 DIAGNOSIS — N183 Chronic kidney disease, stage 3 (moderate): Secondary | ICD-10-CM

## 2015-06-14 MED ORDER — SODIUM CHLORIDE 0.9 % IJ SOLN
10.0000 mL | INTRAMUSCULAR | Status: DC | PRN
  Filled 2015-06-14: qty 10

## 2015-06-14 MED ORDER — SODIUM CHLORIDE 0.9 % IV SOLN
510.0000 mg | Freq: Once | INTRAVENOUS | Status: AC
  Administered 2015-06-14: 510 mg via INTRAVENOUS
  Filled 2015-06-14: qty 17

## 2015-06-14 MED ORDER — SODIUM CHLORIDE 0.9 % IV SOLN
Freq: Once | INTRAVENOUS | Status: AC
  Administered 2015-06-14: 14:00:00 via INTRAVENOUS

## 2015-06-14 NOTE — Patient Instructions (Signed)

## 2015-06-15 ENCOUNTER — Other Ambulatory Visit: Payer: Self-pay | Admitting: Hematology

## 2015-06-15 ENCOUNTER — Ambulatory Visit
Admission: RE | Admit: 2015-06-15 | Discharge: 2015-06-15 | Disposition: A | Discharge status: Home / Self Care | Payer: Medicare Other | Source: Ambulatory Visit | Attending: Hematology | Admitting: Hematology

## 2015-06-15 DIAGNOSIS — N6310 Unspecified lump in the right breast, unspecified quadrant: Secondary | ICD-10-CM

## 2015-06-15 DIAGNOSIS — C50919 Malignant neoplasm of unspecified site of unspecified female breast: Secondary | ICD-10-CM

## 2015-06-16 ENCOUNTER — Other Ambulatory Visit: Payer: Self-pay | Admitting: Gastroenterology

## 2015-06-23 ENCOUNTER — Ambulatory Visit (HOSPITAL_BASED_OUTPATIENT_CLINIC_OR_DEPARTMENT_OTHER): Payer: Medicare Other | Admitting: Hematology

## 2015-06-23 ENCOUNTER — Telehealth: Payer: Self-pay | Admitting: Hematology

## 2015-06-23 ENCOUNTER — Other Ambulatory Visit (HOSPITAL_BASED_OUTPATIENT_CLINIC_OR_DEPARTMENT_OTHER): Payer: Medicare Other

## 2015-06-23 ENCOUNTER — Encounter: Payer: Self-pay | Admitting: Hematology

## 2015-06-23 VITALS — BP 195/94 | HR 69 | Temp 98.2°F | Resp 17 | Ht 60.0 in | Wt 120.6 lb

## 2015-06-23 DIAGNOSIS — Z853 Personal history of malignant neoplasm of breast: Secondary | ICD-10-CM

## 2015-06-23 DIAGNOSIS — N183 Chronic kidney disease, stage 3 (moderate): Secondary | ICD-10-CM | POA: Diagnosis not present

## 2015-06-23 DIAGNOSIS — D509 Iron deficiency anemia, unspecified: Secondary | ICD-10-CM | POA: Diagnosis not present

## 2015-06-23 LAB — COMPREHENSIVE METABOLIC PANEL
ALT: 13 U/L (ref 0–55)
AST: 18 U/L (ref 5–34)
Albumin: 3.8 g/dL (ref 3.5–5.0)
Alkaline Phosphatase: 103 U/L (ref 40–150)
Anion Gap: 8 mEq/L (ref 3–11)
BUN: 18.6 mg/dL (ref 7.0–26.0)
CALCIUM: 10.1 mg/dL (ref 8.4–10.4)
CHLORIDE: 108 meq/L (ref 98–109)
CO2: 24 meq/L (ref 22–29)
CREATININE: 1.1 mg/dL (ref 0.6–1.1)
EGFR: 50 mL/min/{1.73_m2} — ABNORMAL LOW (ref >90)
Glucose: 102 mg/dl (ref 70–140)
POTASSIUM: 4.7 meq/L (ref 3.5–5.1)
Sodium: 140 mEq/L (ref 136–145)
Total Bilirubin: 0.38 mg/dL (ref 0.20–1.20)
Total Protein: 6.7 g/dL (ref 6.4–8.3)

## 2015-06-23 LAB — CBC & DIFF AND RETIC
BASO%: 0.4 % (ref 0.0–2.0)
Basophils Absolute: 0 10*3/uL (ref 0.0–0.1)
EOS%: 0.6 % (ref 0.0–7.0)
Eosinophils Absolute: 0 10*3/uL (ref 0.0–0.5)
HCT: 35.4 % (ref 34.8–46.6)
HGB: 10.7 g/dL — ABNORMAL LOW (ref 11.6–15.9)
Immature Retic Fract: 6.3 % (ref 1.60–10.00)
LYMPH#: 1 10*3/uL (ref 0.9–3.3)
LYMPH%: 19.5 % (ref 14.0–49.7)
MCH: 23.2 pg — AB (ref 25.1–34.0)
MCHC: 30.2 g/dL — AB (ref 31.5–36.0)
MCV: 76.8 fL — AB (ref 79.5–101.0)
MONO#: 0.4 10*3/uL (ref 0.1–0.9)
MONO%: 7.8 % (ref 0.0–14.0)
NEUT#: 3.8 10*3/uL (ref 1.5–6.5)
NEUT%: 71.7 % (ref 38.4–76.8)
PLATELETS: 280 10*3/uL (ref 145–400)
RBC: 4.61 10*6/uL (ref 3.70–5.45)
RDW: 25.2 % — ABNORMAL HIGH (ref 11.2–14.5)
RETIC CT ABS: 102.8 10*3/uL — AB (ref 33.70–90.70)
Retic %: 2.23 % — ABNORMAL HIGH (ref 0.70–2.10)
WBC: 5.3 10*3/uL (ref 3.9–10.3)

## 2015-06-23 LAB — FERRITIN: FERRITIN: 513 ng/mL — AB (ref 9–269)

## 2015-06-23 NOTE — Telephone Encounter (Signed)
per pof to sch pt appt-gave pt copy of avs °

## 2015-06-23 NOTE — Progress Notes (Addendum)
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 At her final pathology revealed a low grade invasive ductal carcinoma with with 0 of 2 lymph nodes positive for metastatic disease. The tumor was ER positive PR positive HER-2/neu negative.  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 Post lumpectomy patient went on to receive radiation therapy from 12/28/2010 2 01/18/2011 to the right breast. She thereafter received single agent Arimidex 1 mg daily starting on 02/06/2011.  #2 05/01/2011 patient developed generalized weakness shortness of breath and syncopal episode. She had a CT of the head performed that was negative. She had CBC done with that showed a hemoglobin of 6.7. She received 3 units of packed red cells. She was severely iron deficient. FOB was positive. Patient had a GI consult performed during her hospitalization and on 12/12 she underwent a colonoscopy that revealed a near obstructing colonic mass worrisome for a hepatic flexure tumor. She had biopsies performed that revealed an adenocarcinoma. She went on to have a right segmental resection performed on 05/05/2011. The final pathology revealed a 5.0 cm poorly differentiated high grade invasive adenocarcinoma with  patchy mucinous features invading through the muscularis propria into pericolonic fat he tissue 34 lymph nodes were negative for metastatic disease. (T3 N0)  CURRENT THERAPY:  1. Breast cancer: arimidex, started on 01/27/2011 2. Colon Cancer: observation 3.  Feraheme as needed for iron deficient anemia, she received on 06/07/15, 06/14/2015  INTERVAL HISTORY: [name] 70 y.o. female with history of anemia, breast and colon cancer returns for followup visit today. I saw her 4 weeks ago, and she is hereto follow-up hernia she was found to have severe iron deficiency on last lab test and received IV Feraheme twice.  She did not notice much change afte infusion, she was not symptomatically for anemia before the infuson.   She has chronic back pain, and reports worsening left hip and buttuck pain for the past 2-3 months. She has been using heating pad, unfortunately had skin burn a few time at left hip and buttuck, which have healed.  She hadleft hip MRI done in December 2016, and is scheduled to see  An orthopedic surgeon late this month.  MEDICAL HISTORY: Past Medical History  Diagnosis Date  . Hypertension   . Depression   . Back pain, chronic   . Breast cancer ([place]) 09/09/10  . Cancer of colon ([place]) 05/24/2011  . GIB (gastrointestinal bleeding)   . SDH (subdural hematoma) ([place])   . CKD (chronic kidney disease), stage III     ALLERGIES:  has No Known Allergies.  MEDICATIONS:  Current Outpatient Prescriptions  Medication Sig Dispense Refill  . ALPRAZolam (XANAX) 0.5 MG tablet Take 0.5 tablets (0.25 mg total) by mouth at bedtime. (Patient taking differently: Take 1 mg by mouth 3 (three) times daily as needed for  anxiety. ) 30 tablet 0  . anastrozole (ARIMIDEX) 1 MG tablet Take 1 tablet (1 mg total) by mouth at bedtime. 90 tablet 3  . buPROPion (WELLBUTRIN XL) 150 MG 24 hr tablet Take 150 mg by mouth daily.    Marland Kitchen docusate sodium (COLACE) 100 MG capsule Take 200 mg by mouth at bedtime.      . furosemide (LASIX) 20 MG tablet Take 20 mg by mouth daily.    . hydrALAZINE (APRESOLINE) 25 MG tablet Take 25 mg by mouth 2 (two) times daily.    Marland Kitchen ibuprofen (ADVIL,MOTRIN) 200 MG tablet Take 800 mg by mouth every 4 (four) hours as needed for mild pain or moderate pain.    Marland Kitchen oxyCODONE (OXYCONTIN) 60 MG 12 hr tablet Take 1 tablet by mouth every 8 (eight) hours.    . potassium chloride SA (K-DUR,KLOR-CON) 20 MEQ tablet Take 1 tablet (20 mEq total) by mouth daily. (Patient taking differently: Take 20 mEq by mouth 2 (two) times daily. ) 30 tablet 0  . rOPINIRole (REQUIP) 0.5 MG tablet Take 0.5 mg by mouth at bedtime as needed ("restlessness").     . silver sulfADIAZINE (SILVADENE) 1 % cream Apply 1 application topically as directed. For blisters.    . traZODone (DESYREL) 50 MG tablet Take 1 tablet by mouth at bedtime.     No current facility-administered medications for this visit.    SURGICAL HISTORY:  Past Surgical History  Procedure Laterality Date  . Back surgery    . Shoulder surgery    . Tumor removal  Tumor removed R breast  . Lumbar disc surgery    . Partial hysterectomy    . Colonoscopy  05/03/2011    Procedure: COLONOSCOPY;  Surgeon: Jeryl Columbia, MD;  Location: [facility] ENDOSCOPY;  Service: Endoscopy;  Laterality: N/A;  . Colon surgery  04/2011  . Breast lumpectomy  10/10/2010    Rt Breast  . Joint replacement      R&L total shoulder replacements    REVIEW OF SYSTEMS:  Constitutional: Denies fevers, chills or abnormal weight loss Eyes: Denies blurriness of vision Ears, nose, mouth, throat, and face: Denies mucositis or sore throat Respiratory: Denies cough, dyspnea or wheezes Cardiovascular: Denies palpitation, chest discomfort or lower extremity swelling Gastrointestinal:  Denies nausea, heartburn or change in bowel habits, (+) low appetite  Skin: Denies abnormal skin rashes Lymphatics: Denies new lymphadenopathy or easy bruising Neuromuscular:Denies numbness, tingling or  new weaknesses, (+) left hip pain  Behavioral/Psych: Mood is stable, no new changes  All other systems were reviewed with the patient and are negative.  PHYSICAL EXAMINATION:  ECOG PERFORMANCE STTUS: 1 - Symptomatic but completely ambulatory Blood pressure 195/94, pulse 69, temperature 98.2 F (36.8 C), temperature source Oral, resp. rate 17, height 5' (1.524 m), weight 120 lb 9.6 oz (54.704 kg), SpO2 100 %. Patient is awake alert in no acute distress.  HEENT exam: EOMI PERRLA sclerae anicteric no conjunctival pallor oral mucosa is moist. Neck: Supple no palpable cervical supraclavicular or axillary adenopathy. Lungs are clear bilaterally to auscultation and percussion cardiovascular Cardiovascular: Regular rate rhythm no murmurs gallops or rubs. Abdomen is soft nontender nondistended bowel sounds are present well-healed surgical scar. There is no hepatosplenomegaly. Extremities Mild edema there is noted to be some tenderness bilaterally in the calf. Neuro patient's alert oriented x3 otherwise nonfocal. Patient does has considerable amount of anxiety Breasts: Breast inspection showed them to be symmetrical with no nipple discharge.  There is a small mass underneat th  surgical srar in right breast. Palpation of the left breast and axilla revealed no obvious mass that I could appreciate.   LABORATORY DATA: CBC Latest Ref Rng 06/23/2015 05/19/2015 04/13/2015  WBC 3.9 - 10.3 10e3/uL 5.3 4.7 6.1  Hemoglobin 11.6 - 15.9 g/dL 10.7(L) 8.5(L) 8.3(L)  Hematocrit 34.8 - 46.6 % 35.4 28.3(L) 27.8(L)  Platelets 145 - 400 10e3/uL 280 322 401(H)    CMP Latest Ref Rng 06/23/2015 05/19/2015 04/13/2015  Glucose 70 - 140 mg/dl 102 91 84  BUN 7.0 - 26.0 mg/dL 18.6 14.5 16  Creatinine 0.6 - 1.1 mg/dL 1.1 1.5(H) 1.08(H)  Sodium 136 - 145 mEq/L 140 141 140  Potassium 3.5 - 5.1 mEq/L 4.7 3.7 3.4(L)  Chloride 101 - 111 mmol/L - - 104  CO2 22 - 29 mEq/L '24 28 26  ' Calcium 8.4 - 10.4 mg/dL 10.1 9.6 9.8  Total  Protein 6.4 - 8.3 g/dL 6.7 6.7 6.9  Total Bilirubin 0.20 - 1.20 mg/dL 0.38 <0.30 0.4  Alkaline Phos 40 - 150 U/L 103 103 98  AST 5 - 34 U/L '18 20 28  ' ALT 0 - 55 U/L '13 12 17   ' CEA  Status: Finalresult Visible to patient:  Not Released Nextappt: 06/24/2015 at 01:00 PM in Oncology ([facility]) Dx:  History of colon cancer           Ref Range 10d ago (05/19/15) 66yrago (09/25/11) 458yrgo (05/04/11) 4y96yro (05/03/11)    CEA 0.0 - 5.0 ng/mL 3.6 2.1 2.2 2.4         RADIOLOGY REPORT  MAMMOGRAM AND [name]24/2017 IMPRESSION: 1. The palpable lump in the upper-outer right breast corresponds with a benign oil cyst at the lumpectomy site.  2. No mammographic evidence of malignancy in the bilateral breasts.  RECOMMENDATION: Diagnostic mammogram is suggested in 1 year. (Code:DM-B-01Y)  Left hip MRI without contrast 04/26/2015 IMPRESSION: 1. No hip fracture, dislocation or avascular necrosis. 2. 2.5 cm signal abnormality in the left gluteus maximus muscle with surrounding edema most concerning for a small intramuscular hematoma versus less likely infection.   ASSESSMENT AND PLAN: [age]ar old female with 2 primary malignancies:  1. Iron deficient anemia -Her Hb dropped to 8.5 in the past months,lab from 1/3/2017showed low serum iron at 14, elevated TIBC 483, and low saturation 3%,  Ferritin was 19.this is consistent wit iron deficient anemia - Her anemia has signifianly mpove after IV Ferheme,hemoglobin 10.7 today -I encourage her to take OTC iron pill  -she recently had EGD and colonoscopy by Dr. OutPaulita Fujita will obtain the reports  2.  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 Recent mammo and [place] with her  - I encouraged her to Have healthy diet and exercise regularly -Patient will  continue Arimidex 1 mg daily for a duration of 5 years, until Sep 2017  3. History of stage II adenocarcinoma of the right colon  -status post segmental resection in 04/2011 -repeated CEA levels have been normal -she had CT CAP on 04/14/2015 with contrast for dyspnea and  Leg swelling,  Scan was negative for recurrent disease. - she has been 4 years out of the initial surgery, the risk of recurrence has significantly decreased. -she recently had repeated colonoscopy,I'll will et a copy of the report from Dr. OutErlinda Hongfice. Per patient, it was negative.  -  will continue follow her lab an CEA, no more routine surveillance scan   4. HTN, anorexia, anxiety and depression leg swelling -She will continue follow up with her PCP  5. Left hip pain -her left hip MRI did not show no abnormal bone lesions. No evidence of cancer metastasis. - she was found to have a 2.5 cm abnormality in the left gluteus , possible hematoma - she will follow-up with an orthopedicsurgeon  6. CKD, stage III -improved sine she came off lasix 2 weeks ago -follow up with PCP   PLAN:  -repeat CBC in 2 months and I'll see her back in 4 months for anemia follow up -I will obtain her EGD and colonoscopy report form Dr. Erlinda Hong office   All questions were answered. The patient knows to call the clinic with any problems, questions or concerns. We can certainly see the patient much sooner if necessary.  I spent 20 minutes counseling the patient face to face. The total time spent in the appointment was 30 minutes.   Truitt Merle 06/23/2015      Addendum I reviewed her EGD and colonoscopy report from Dr. Erlinda Hong office. He was unremarkable except a 10 mm erosion in the prepyloric region of systemic, biopsy was done, it showed reactive gastropathy.  Truitt Merle  08/30/2015

## 2015-06-24 ENCOUNTER — Other Ambulatory Visit: Payer: Self-pay

## 2015-06-24 ENCOUNTER — Ambulatory Visit: Payer: Self-pay | Admitting: Hematology

## 2015-07-06 ENCOUNTER — Other Ambulatory Visit: Payer: Self-pay | Admitting: Hematology

## 2015-07-28 ENCOUNTER — Telehealth: Payer: Self-pay | Admitting: Hematology

## 2015-07-28 NOTE — Telephone Encounter (Signed)
Patient called to reschedule 4/3 lab to 4/4. Patient has new date/time for 4/4 @ 2 pm.

## 2015-08-23 ENCOUNTER — Other Ambulatory Visit: Payer: Self-pay

## 2015-08-24 ENCOUNTER — Other Ambulatory Visit (HOSPITAL_BASED_OUTPATIENT_CLINIC_OR_DEPARTMENT_OTHER): Payer: Medicare Other

## 2015-08-24 ENCOUNTER — Other Ambulatory Visit: Payer: Self-pay | Admitting: Hematology

## 2015-08-24 DIAGNOSIS — D509 Iron deficiency anemia, unspecified: Secondary | ICD-10-CM

## 2015-08-24 LAB — CBC & DIFF AND RETIC
BASO%: 0.4 % (ref 0.0–2.0)
BASOS ABS: 0 10*3/uL (ref 0.0–0.1)
EOS ABS: 0.1 10*3/uL (ref 0.0–0.5)
EOS%: 0.9 % (ref 0.0–7.0)
HEMATOCRIT: 42.8 % (ref 34.8–46.6)
HEMOGLOBIN: 14.5 g/dL (ref 11.6–15.9)
IMMATURE RETIC FRACT: 2.5 % (ref 1.60–10.00)
LYMPH#: 1.8 10*3/uL (ref 0.9–3.3)
LYMPH%: 32.1 % (ref 14.0–49.7)
MCH: 28.7 pg (ref 25.1–34.0)
MCHC: 33.9 g/dL (ref 31.5–36.0)
MCV: 84.6 fL (ref 79.5–101.0)
MONO#: 0.4 10*3/uL (ref 0.1–0.9)
MONO%: 7.5 % (ref 0.0–14.0)
NEUT#: 3.3 10*3/uL (ref 1.5–6.5)
NEUT%: 59.1 % (ref 38.4–76.8)
Platelets: 238 10*3/uL (ref 145–400)
RBC: 5.06 10*6/uL (ref 3.70–5.45)
RDW: 22.3 % — ABNORMAL HIGH (ref 11.2–14.5)
RETIC %: 1.01 % (ref 0.70–2.10)
RETIC CT ABS: 51.11 10*3/uL (ref 33.70–90.70)
WBC: 5.6 10*3/uL (ref 3.9–10.3)
nRBC: 0 % (ref 0–0)

## 2015-08-24 LAB — IRON AND TIBC
%SAT: 13 % — ABNORMAL LOW (ref 21–57)
Iron: 50 ug/dL (ref 41–142)
TIBC: 375 ug/dL (ref 236–444)
UIBC: 325 ug/dL (ref 120–384)

## 2015-08-24 LAB — FERRITIN: Ferritin: 44 ng/ml (ref 9–269)

## 2015-08-25 ENCOUNTER — Telehealth: Payer: Self-pay | Admitting: *Deleted

## 2015-08-25 ENCOUNTER — Other Ambulatory Visit: Payer: Self-pay | Admitting: Hematology

## 2015-08-25 DIAGNOSIS — D509 Iron deficiency anemia, unspecified: Secondary | ICD-10-CM

## 2015-08-25 NOTE — Telephone Encounter (Signed)
Pt returned call & informed of good labs but need to repeat lab in 1 mo per Dr Burr Medico.  She will try to call schedulers to schedule lab appt.  Called Dr Erlinda Hong office for EGD & colonoscopy reports.

## 2015-08-25 NOTE — Telephone Encounter (Signed)
-----   Message from Truitt Merle, MD sent at 08/25/2015  9:06 AM EDT ----- [name],  Please call her and let her know the lab result. Her anemia has resolved, she responded to iv iron very well. Her iron level is normal, but dropping from 3 months ago. i want her to have lab repeated in one month (POF sent) to see if she needs Feraheme again, and I will see her back in 2 months with lab again as scheduled. Please also obtain her recent EGD and colonosocpy report from Dr. Erlinda Hong office.   Thanks.   Truitt Merle  08/25/2015

## 2015-08-25 NOTE — Progress Notes (Signed)
Left message for pt to call back  °

## 2015-08-26 ENCOUNTER — Telehealth: Payer: Self-pay | Admitting: Hematology

## 2015-08-26 NOTE — Telephone Encounter (Signed)
per pof to sch appt-cld and left pt a message of time & date of lab pe rpof

## 2015-08-27 ENCOUNTER — Telehealth: Payer: Self-pay | Admitting: Hematology

## 2015-08-27 NOTE — Telephone Encounter (Signed)
per pof to sch pt appt-pt cld to get sch gave pt time & date for 5/3 appt

## 2015-09-21 ENCOUNTER — Ambulatory Visit (HOSPITAL_BASED_OUTPATIENT_CLINIC_OR_DEPARTMENT_OTHER): Payer: Medicare Other | Admitting: Hematology

## 2015-09-21 ENCOUNTER — Other Ambulatory Visit (HOSPITAL_BASED_OUTPATIENT_CLINIC_OR_DEPARTMENT_OTHER): Payer: Medicare Other

## 2015-09-21 ENCOUNTER — Telehealth: Payer: Self-pay | Admitting: Hematology

## 2015-09-21 ENCOUNTER — Encounter: Payer: Self-pay | Admitting: Hematology

## 2015-09-21 VITALS — BP 184/71 | HR 54 | Temp 98.8°F | Resp 18 | Ht 60.0 in | Wt 115.4 lb

## 2015-09-21 DIAGNOSIS — D509 Iron deficiency anemia, unspecified: Secondary | ICD-10-CM

## 2015-09-21 DIAGNOSIS — N183 Chronic kidney disease, stage 3 (moderate): Secondary | ICD-10-CM | POA: Diagnosis not present

## 2015-09-21 DIAGNOSIS — Z853 Personal history of malignant neoplasm of breast: Secondary | ICD-10-CM | POA: Diagnosis not present

## 2015-09-21 DIAGNOSIS — Z85038 Personal history of other malignant neoplasm of large intestine: Secondary | ICD-10-CM | POA: Insufficient documentation

## 2015-09-21 DIAGNOSIS — C50911 Malignant neoplasm of unspecified site of right female breast: Secondary | ICD-10-CM

## 2015-09-21 LAB — COMPREHENSIVE METABOLIC PANEL
ALT: 18 U/L (ref 0–55)
AST: 22 U/L (ref 5–34)
Albumin: 3.8 g/dL (ref 3.5–5.0)
Alkaline Phosphatase: 88 U/L (ref 40–150)
Anion Gap: 9 mEq/L (ref 3–11)
BILIRUBIN TOTAL: 0.56 mg/dL (ref 0.20–1.20)
BUN: 22.5 mg/dL (ref 7.0–26.0)
CO2: 26 meq/L (ref 22–29)
CREATININE: 1.2 mg/dL — AB (ref 0.6–1.1)
Calcium: 9.8 mg/dL (ref 8.4–10.4)
Chloride: 106 mEq/L (ref 98–109)
EGFR: 47 mL/min/{1.73_m2} — ABNORMAL LOW (ref >90)
GLUCOSE: 103 mg/dL (ref 70–140)
Potassium: 3.8 mEq/L (ref 3.5–5.1)
SODIUM: 141 meq/L (ref 136–145)
TOTAL PROTEIN: 6.6 g/dL (ref 6.4–8.3)

## 2015-09-21 LAB — CBC & DIFF AND RETIC
BASO%: 0.3 % (ref 0.0–2.0)
Basophils Absolute: 0 10*3/uL (ref 0.0–0.1)
EOS ABS: 0.1 10*3/uL (ref 0.0–0.5)
EOS%: 0.7 % (ref 0.0–7.0)
HCT: 43.9 % (ref 34.8–46.6)
HGB: 15 g/dL (ref 11.6–15.9)
IMMATURE RETIC FRACT: 2.2 % (ref 1.60–10.00)
LYMPH#: 1.7 10*3/uL (ref 0.9–3.3)
LYMPH%: 23.4 % (ref 14.0–49.7)
MCH: 30 pg (ref 25.1–34.0)
MCHC: 34.2 g/dL (ref 31.5–36.0)
MCV: 87.8 fL (ref 79.5–101.0)
MONO#: 0.5 10*3/uL (ref 0.1–0.9)
MONO%: 7 % (ref 0.0–14.0)
NEUT%: 68.6 % (ref 38.4–76.8)
NEUTROS ABS: 4.9 10*3/uL (ref 1.5–6.5)
PLATELETS: 235 10*3/uL (ref 145–400)
RBC: 5 10*6/uL (ref 3.70–5.45)
RDW: 16.2 % — ABNORMAL HIGH (ref 11.2–14.5)
RETIC CT ABS: 52.5 10*3/uL (ref 33.70–90.70)
Retic %: 1.05 % (ref 0.70–2.10)
WBC: 7.2 10*3/uL (ref 3.9–10.3)

## 2015-09-21 LAB — FERRITIN: Ferritin: 48 ng/ml (ref 9–269)

## 2015-09-21 NOTE — Telephone Encounter (Signed)
per pfo to sch appt-pt stated no need to come to June appt to CX-gave pt copy of avs

## 2015-09-21 NOTE — Progress Notes (Signed)
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 At her final pathology revealed a low grade invasive ductal carcinoma with with 0 of 2 lymph nodes positive for metastatic disease. The tumor was ER positive PR positive HER-2/neu negative.  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 Post lumpectomy patient went on to receive radiation therapy from 12/28/2010 2 01/18/2011 to the right breast. She thereafter received single agent Arimidex 1 mg daily starting on 02/06/2011.  #2 05/01/2011 patient developed generalized weakness shortness of breath and syncopal episode. She had a CT of the head performed that was negative. She had CBC done with that showed a hemoglobin of 6.7. She received 3 units of packed red cells. She was severely iron deficient. FOB was positive. Patient had a GI consult performed during her hospitalization and on 12/12 she underwent a colonoscopy that revealed a near obstructing colonic mass worrisome for a hepatic flexure tumor. She had biopsies performed that revealed an adenocarcinoma. She went on to have a right segmental resection performed on 05/05/2011. The final pathology revealed a 5.0 cm poorly differentiated high grade invasive adenocarcinoma with  patchy mucinous features invading through the muscularis propria into pericolonic fat he tissue 34 lymph nodes were negative for metastatic disease. (T3 N0)  CURRENT THERAPY:  1. Breast cancer: arimidex, started on 01/27/2011 2. Colon Cancer: observation 3.  Feraheme as needed for iron deficient anemia, she received on 06/07/15, 06/14/2015  INTERVAL HISTORY: [name] Ogren 70 y.o. female with history of anemia, breast and colon cancer returns for followup visit today. I last saw her 3 months ago for iron deficient anemia. Her anemia resolved after IV Feraheme.  She states she is doing well overall, her main complaint is the left hip and buttock pain, she has been seeing orthopedic surgeon, and will have a steroid injection in the near future. She is able to walk around, no other new complaints. She reports her energy level has been stable, and she has been eating better.  MEDICAL HISTORY: Past Medical History  Diagnosis Date  . Hypertension   . Depression   . Back pain, chronic   . Breast cancer ([place]) 09/09/10  . Cancer of colon ([place]) 05/24/2011  . GIB (gastrointestinal bleeding)   . SDH (subdural hematoma) ([place])   . CKD (chronic kidney disease), stage III     ALLERGIES:  has No Known Allergies.  MEDICATIONS:  Current Outpatient Prescriptions  Medication Sig Dispense Refill  . ALPRAZolam (XANAX) 0.5 MG tablet Take 0.5 tablets (0.25 mg total) by mouth at bedtime. (Patient taking differently: Take 1 mg by mouth as needed for anxiety. ) 30 tablet 0  . Amlodipine-Valsartan-HCTZ 10-320-25 MG TABS Take 1 tablet by mouth daily.    Marland Kitchen anastrozole (ARIMIDEX) 1 MG tablet TAKE ONE (1) TABLET BY MOUTH AT BEDTIME 90 tablet 1  .  buPROPion (WELLBUTRIN XL) 150 MG 24 hr tablet Take 150 mg by mouth daily.    Marland Kitchen docusate sodium (COLACE) 100 MG capsule Take 200 mg by mouth at bedtime.    . metoprolol succinate (TOPROL-XL) 25 MG 24 hr tablet Take 25 mg by mouth 2 (two) times daily.    Marland Kitchen oxyCODONE  (OXYCONTIN) 60 MG 12 hr tablet Take 1 tablet by mouth every 8 (eight) hours.    Marland Kitchen rOPINIRole (REQUIP) 0.5 MG tablet Take 0.5 mg by mouth at bedtime as needed ("restlessness").      No current facility-administered medications for this visit.    SURGICAL HISTORY:  Past Surgical History  Procedure Laterality Date  . Back surgery    . Shoulder surgery    . Tumor removal  Tumor removed R breast  . Lumbar disc surgery    . Partial hysterectomy    . Colonoscopy  05/03/2011    Procedure: COLONOSCOPY;  Surgeon: Jeryl Columbia, MD;  Location: [facility] ENDOSCOPY;  Service: Endoscopy;  Laterality: N/A;  . Colon surgery  04/2011  . Breast lumpectomy  10/10/2010    Rt Breast  . Joint replacement      R&L total shoulder replacements    REVIEW OF SYSTEMS:  Constitutional: Denies fevers, chills or abnormal weight loss Eyes: Denies blurriness of vision Ears, nose, mouth, throat, and face: Denies mucositis or sore throat Respiratory: Denies cough, dyspnea or wheezes Cardiovascular: Denies palpitation, chest discomfort or lower extremity swelling Gastrointestinal:  Denies nausea, heartburn or change in bowel habits, (+) low appetite  Skin: Denies abnormal skin rashes Lymphatics: Denies new lymphadenopathy or easy bruising Neuromuscular:Denies numbness, tingling or new weaknesses, (+) left hip pain  Behavioral/Psych: Mood is stable, no new changes  All other systems were reviewed with the patient and are negative.  PHYSICAL EXAMINATION:  ECOG PERFORMANCE STTUS: 1 - Symptomatic but completely ambulatory Blood pressure 184/71, pulse 54, temperature 98.8 F (37.1 C), temperature source Oral, resp. rate 18, height 5' (1.524 m), weight 115 lb 6.4 oz (52.345 kg), SpO2 99 %. Patient is awake alert in no acute distress.  HEENT exam: EOMI PERRLA sclerae anicteric no conjunctival pallor oral mucosa is moist. Neck: Supple no palpable cervical supraclavicular or axillary adenopathy. Lungs are clear bilaterally to  auscultation and percussion cardiovascular Cardiovascular: Regular rate rhythm no murmurs gallops or rubs. Abdomen is soft nontender nondistended bowel sounds are present well-healed surgical scar. There is no hepatosplenomegaly. Extremities Mild edema there is noted to be some tenderness bilaterally in the calf. Neuro patient'Bean alert oriented x3 otherwise nonfocal. Patient does has considerable amount of anxiety Breasts: Breast inspection showed them to be symmetrical with no nipple discharge.  There is a small mass underneat th surgical srar in right breast. Palpation of the left breast and axilla revealed no obvious mass that I could appreciate.   LABORATORY DATA: CBC Latest Ref Rng 09/21/2015 08/24/2015 06/23/2015  WBC 3.9 - 10.3 10e3/uL 7.2 5.6 5.3  Hemoglobin 11.6 - 15.9 g/dL 15.0 14.5 10.7(L)  Hematocrit 34.8 - 46.6 % 43.9 42.8 35.4  Platelets 145 - 400 10e3/uL 235 238 280    CMP Latest Ref Rng 09/21/2015 06/23/2015 05/19/2015  Glucose 70 - 140 mg/dl 103 102 91  BUN 7.0 - 26.0 mg/dL 22.5 18.6 14.5  Creatinine 0.6 - 1.1 mg/dL 1.2(H) 1.1 1.5(H)  Sodium 136 - 145 mEq/L 141 140 141  Potassium 3.5 - 5.1 mEq/L 3.8 4.7 3.7  Chloride 101 - 111 mmol/L - - -  CO2 22 -  29 mEq/L '26 24 28  ' Calcium 8.4 - 10.4 mg/dL 9.8 10.1 9.6  Total Protein 6.4 - 8.3 g/dL 6.6 6.7 6.7  Total Bilirubin 0.20 - 1.20 mg/dL 0.56 0.38 <0.30  Alkaline Phos 40 - 150 U/L 88 103 103  AST 5 - 34 U/L '22 18 20  ' ALT 0 - 55 U/L '18 13 12   ' Results for Haroon, [name] (MRN [id number]) as of 09/21/2015 14:25  Ref. Range 05/25/2015 15:30 06/23/2015 10:51 08/24/2015 13:55  Iron Latest Ref Range: 41-142 ug/dL 14 (L)  50  UIBC Latest Ref Range: 120-384 ug/dL 468 (H)  325  TIBC Latest Ref Range: 236-444 ug/dL 483 (H)  375  %SAT Latest Ref Range: 21-57 % 3 (L)  13 (L)  Ferritin Latest Ref Range: 9-269 ng/ml 19 513 (H) 44    RADIOLOGY REPORT  MAMMOGRAM AND [name] 06/15/2015 IMPRESSION: 1. The palpable lump in the upper-outer right breast  corresponds with a benign oil cyst at the lumpectomy site.  2. No mammographic evidence of malignancy in the bilateral breasts.  RECOMMENDATION: Diagnostic mammogram is suggested in 1 year. (Code:DM-B-01Y)  Left hip MRI without contrast 04/26/2015 IMPRESSION: 1. No hip fracture, dislocation or avascular necrosis. 2. 2.5 cm signal abnormality in the left gluteus maximus muscle with surrounding edema most concerning for a small intramuscular hematoma versus less likely infection.   ASSESSMENT AND PLAN: 70 year old female with 2 primary malignancies:  1. Iron deficient anemia -Her Hb dropped to 8.5 in 04/2015, lab from 1/3/2017showed low serum iron at 14, elevated TIBC 483, and low saturation 3%,  Ferritin was 19, this is consistent wit iron deficient anemia - Her anemia has resolved after IV Ferheme,hemoglobin 15.0 today -I encourage her to take OTC iron pill and she agrees to try - she had EGD and colonoscopy in 05/2015 which were unremarkable except a 10 mm erosion in the prepyloric region of systemic, biopsy was done, it showed reactive gastropathy. -The etiology of her iron deficiency is still unclear, patient does not want more work up such as capsule endoscopy -We'll continue follow-up her CBC and iron studies.  2.  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 Recent mammo and [name] result reviewed with her  - I encouraged her to Have healthy diet and exercise regularly -Patient will continue Arimidex 1 mg daily for a duration of 5 years, until Sep 2017  3. History of stage II adenocarcinoma of the right colon  -status post segmental resection in 04/2011 -repeated CEA levels have been normal -she had CT CAP on 04/14/2015 with contrast for dyspnea and  Leg swelling,  Scan was negative for recurrent disease. - she has been  4.5 years out of the initial surgery, the risk of recurrence has significantly decreased. -Continue surveillance  4. HTN -She will continue follow up with her PCP  5. Left hip pain -her left hip MRI did not show no abnormal bone lesions. No evidence of cancer metastasis. - she was found to have a 2.5 cm abnormality in the left gluteus , possible hematoma - she will follow-up with an orthopedicsurgeon  6. CKD, stage III -follow up with PCP   PLAN:  -Continue anastrozole until September 2070 -Patient refused frequent lab check for iron deficient anemia, agreeable to coming in 4 months for lab and follow-up.  -If her ferritin less  than 30 today, I'll set up IV Feraheme for her  All questions were answered. The patient knows to call the clinic with any problems, questions or concerns. We can certainly see the patient much sooner if necessary.  I spent 20 minutes counseling the patient face to face. The total time spent in the appointment was 30 minutes.   Truitt Merle 09/21/2015      Addendum I reviewed her EGD and colonoscopy report from Dr. Erlinda Hong office. He was unremarkable except a 10 mm erosion in the prepyloric region of systemic, biopsy was done, it showed reactive gastropathy.  Truitt Merle  09/21/2015

## 2015-09-22 ENCOUNTER — Other Ambulatory Visit: Payer: No Typology Code available for payment source

## 2015-10-06 ENCOUNTER — Telehealth: Payer: Self-pay | Admitting: Hematology

## 2015-10-06 NOTE — Telephone Encounter (Signed)
Faxed pt office note to [facility] physicians at [facility] [phone]

## 2015-10-25 ENCOUNTER — Ambulatory Visit: Payer: No Typology Code available for payment source | Admitting: Hematology

## 2015-10-25 ENCOUNTER — Other Ambulatory Visit: Payer: Self-pay | Admitting: Hematology

## 2015-10-25 ENCOUNTER — Ambulatory Visit: Payer: Self-pay | Admitting: Hematology

## 2015-10-25 ENCOUNTER — Other Ambulatory Visit (HOSPITAL_BASED_OUTPATIENT_CLINIC_OR_DEPARTMENT_OTHER): Payer: Medicare Other

## 2015-10-25 ENCOUNTER — Encounter: Payer: Self-pay | Admitting: Hematology

## 2015-10-25 ENCOUNTER — Ambulatory Visit (HOSPITAL_BASED_OUTPATIENT_CLINIC_OR_DEPARTMENT_OTHER): Payer: Medicare Other | Admitting: Hematology

## 2015-10-25 ENCOUNTER — Telehealth: Payer: Self-pay | Admitting: Hematology

## 2015-10-25 ENCOUNTER — Other Ambulatory Visit: Payer: No Typology Code available for payment source

## 2015-10-25 ENCOUNTER — Other Ambulatory Visit: Payer: Self-pay

## 2015-10-25 VITALS — BP 139/51 | HR 67 | Temp 98.2°F | Resp 18 | Ht 60.0 in | Wt 118.8 lb

## 2015-10-25 DIAGNOSIS — N183 Chronic kidney disease, stage 3 (moderate): Secondary | ICD-10-CM

## 2015-10-25 DIAGNOSIS — D509 Iron deficiency anemia, unspecified: Secondary | ICD-10-CM | POA: Diagnosis not present

## 2015-10-25 DIAGNOSIS — Z85038 Personal history of other malignant neoplasm of large intestine: Secondary | ICD-10-CM

## 2015-10-25 DIAGNOSIS — C50911 Malignant neoplasm of unspecified site of right female breast: Secondary | ICD-10-CM | POA: Diagnosis not present

## 2015-10-25 LAB — IRON AND TIBC
%SAT: 15 % — ABNORMAL LOW (ref 21–57)
Iron: 56 ug/dL (ref 41–142)
TIBC: 374 ug/dL (ref 236–444)
UIBC: 318 ug/dL (ref 120–384)

## 2015-10-25 LAB — CBC & DIFF AND RETIC
BASO%: 0.4 % (ref 0.0–2.0)
BASOS ABS: 0 10*3/uL (ref 0.0–0.1)
EOS ABS: 0 10*3/uL (ref 0.0–0.5)
EOS%: 0.8 % (ref 0.0–7.0)
HEMATOCRIT: 39.7 % (ref 34.8–46.6)
HEMOGLOBIN: 13.3 g/dL (ref 11.6–15.9)
IMMATURE RETIC FRACT: 4 % (ref 1.60–10.00)
LYMPH%: 28.8 % (ref 14.0–49.7)
MCH: 31 pg (ref 25.1–34.0)
MCHC: 33.5 g/dL (ref 31.5–36.0)
MCV: 92.5 fL (ref 79.5–101.0)
MONO#: 0.5 10*3/uL (ref 0.1–0.9)
MONO%: 8.7 % (ref 0.0–14.0)
NEUT#: 3.2 10*3/uL (ref 1.5–6.5)
NEUT%: 61.3 % (ref 38.4–76.8)
Platelets: 227 10*3/uL (ref 145–400)
RBC: 4.29 10*6/uL (ref 3.70–5.45)
RDW: 14.2 % (ref 11.2–14.5)
Retic %: 1.87 % (ref 0.70–2.10)
Retic Ct Abs: 80.22 10*3/uL (ref 33.70–90.70)
WBC: 5.3 10*3/uL (ref 3.9–10.3)
lymph#: 1.5 10*3/uL (ref 0.9–3.3)

## 2015-10-25 LAB — FERRITIN: Ferritin: 46 ng/ml (ref 9–269)

## 2015-10-25 NOTE — Progress Notes (Signed)
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 At her final pathology revealed a low grade invasive ductal carcinoma with with 0 of 2 lymph nodes positive for metastatic disease. The tumor was ER positive PR positive HER-2/neu negative.  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 Post lumpectomy patient went on to receive radiation therapy from 12/28/2010 2 01/18/2011 to the right breast. She thereafter received single agent Arimidex 1 mg daily starting on 02/06/2011.  #2 05/01/2011 patient developed generalized weakness shortness of breath and syncopal episode. She had a CT of the head performed that was negative. She had CBC done with that showed a hemoglobin of 6.7. She received 3 units of packed red cells. She was severely iron deficient. FOB was positive. Patient had a GI consult performed during her hospitalization and on 12/12 she underwent a colonoscopy that revealed a near obstructing colonic mass worrisome for a hepatic flexure tumor. She had biopsies performed that revealed an adenocarcinoma. She went on to have a right segmental resection performed on 05/05/2011. The final pathology revealed a 5.0 cm poorly differentiated high grade invasive adenocarcinoma with  patchy mucinous features invading through the muscularis propria into pericolonic fat he tissue 34 lymph nodes were negative for metastatic disease. (T3 N0)  CURRENT THERAPY:  1. Breast cancer: arimidex, started on 01/27/2011 2. Colon Cancer: observation 3.  Feraheme as needed for iron deficient anemia, she received on 06/07/15, 06/14/2015  INTERVAL HISTORY: [name] 70 y.o. female with history of anemia, breast and colon cancer returns for followup visit today. I last saw her one months ago for iron deficient anemia. She is here for follow up.   Her main complain is low back and left hip pain. She saw her orthopedic surgeon and had steroid injection a few month ago but it did not help much, no other complains, she feels well overall.    MEDICAL HISTORY: Past Medical History  Diagnosis Date  . Hypertension   . Depression   . Back pain, chronic   . Breast cancer ([place]) 09/09/10  . Cancer of colon ([place]) 05/24/2011  . GIB (gastrointestinal bleeding)   . SDH (subdural hematoma) ([place])   . CKD (chronic kidney disease), stage III     ALLERGIES:  has No Known Allergies.  MEDICATIONS:  Current Outpatient Prescriptions  Medication Sig Dispense Refill  . ALPRAZolam (XANAX) 0.5 MG tablet Take 0.5 tablets (0.25 mg total) by mouth at bedtime. (Patient taking differently: Take 1 mg by mouth as needed for anxiety. ) 30 tablet 0  . Amlodipine-Valsartan-HCTZ 10-320-25 MG TABS Take 1 tablet by mouth daily.    Marland Kitchen anastrozole (ARIMIDEX) 1 MG tablet TAKE ONE (1) TABLET BY MOUTH AT BEDTIME 90 tablet 1  . buPROPion (WELLBUTRIN XL) 150 MG 24 hr tablet Take 150 mg by mouth daily.    Marland Kitchen  docusate sodium (COLACE) 100 MG capsule Take 200 mg by mouth at bedtime.    . metoprolol succinate (TOPROL-XL) 25 MG 24 hr tablet Take 25 mg by mouth 2 (two) times daily.    Marland Kitchen oxyCODONE (OXYCONTIN) 60 MG 12 hr tablet Take 1 tablet by mouth every 8 (eight) hours.    Marland Kitchen rOPINIRole (REQUIP) 0.5 MG tablet Take 0.5 mg by mouth at  bedtime as needed ("restlessness").      No current facility-administered medications for this visit.    SURGICAL HISTORY:  Past Surgical History  Procedure Laterality Date  . Back surgery    . Shoulder surgery    . Tumor removal  Tumor removed R breast  . Lumbar disc surgery    . Partial hysterectomy    . Colonoscopy  05/03/2011    Procedure: COLONOSCOPY;  Surgeon: Jeryl Columbia, MD;  Location: [facility] ENDOSCOPY;  Service: Endoscopy;  Laterality: N/A;  . Colon surgery  04/2011  . Breast lumpectomy  10/10/2010    Rt Breast  . Joint replacement      R&L total shoulder replacements    REVIEW OF SYSTEMS:  Constitutional: Denies fevers, chills or abnormal weight loss Eyes: Denies blurriness of vision Ears, nose, mouth, throat, and face: Denies mucositis or sore throat Respiratory: Denies cough, dyspnea or wheezes Cardiovascular: Denies palpitation, chest discomfort or lower extremity swelling Gastrointestinal:  Denies nausea, heartburn or change in bowel habits, (+) low appetite  Skin: Denies abnormal skin rashes Lymphatics: Denies new lymphadenopathy or easy bruising Neuromuscular:Denies numbness, tingling or new weaknesses, (+) left hip pain  Behavioral/Psych: Mood is stable, no new changes  All other systems were reviewed with the patient and are negative.  PHYSICAL EXAMINATION:  ECOG PERFORMANCE STTUS: 1 - Symptomatic but completely ambulatory Blood pressure 139/51, pulse 67, temperature 98.2 F (36.8 C), temperature source Oral, resp. rate 18, height 5' (1.524 m), weight 118 lb 12.8 oz (53.887 kg), SpO2 96 %. Patient is awake alert in no acute distress.  HEENT exam: EOMI PERRLA sclerae anicteric no conjunctival pallor oral mucosa is moist. Neck: Supple no palpable cervical supraclavicular or axillary adenopathy. Lungs are clear bilaterally to auscultation and percussion cardiovascular Cardiovascular: Regular rate rhythm no murmurs gallops or rubs. Abdomen is soft nontender  nondistended bowel sounds are present well-healed surgical scar. There is no hepatosplenomegaly. Extremities Mild edema there is noted to be some tenderness bilaterally in the calf. Neuro patient's alert oriented x3 otherwise nonfocal. Patient does has considerable amount of anxiety Breasts: Breast inspection showed them to be symmetrical with no nipple discharge.  There is a small mass underneat th surgical srar in right breast. Palpation of the left breast and axilla revealed no obvious mass that I could appreciate.   LABORATORY DATA: CBC Latest Ref Rng 10/25/2015 09/21/2015 08/24/2015  WBC 3.9 - 10.3 10e3/uL 5.3 7.2 5.6  Hemoglobin 11.6 - 15.9 g/dL 13.3 15.0 14.5  Hematocrit 34.8 - 46.6 % 39.7 43.9 42.8  Platelets 145 - 400 10e3/uL 227 235 238    CMP Latest Ref Rng 09/21/2015 06/23/2015 05/19/2015  Glucose 70 - 140 mg/dl 103 102 91  BUN 7.0 - 26.0 mg/dL 22.5 18.6 14.5  Creatinine 0.6 - 1.1 mg/dL 1.2(H) 1.1 1.5(H)  Sodium 136 - 145 mEq/L 141 140 141  Potassium 3.5 - 5.1 mEq/L 3.8 4.7 3.7  Chloride 101 - 111 mmol/L - - -  CO2 22 - 29 mEq/L '26 24 28  ' Calcium 8.4 - 10.4 mg/dL 9.8 10.1 9.6  Total Protein  6.4 - 8.3 g/dL 6.6 6.7 6.7  Total Bilirubin 0.20 - 1.20 mg/dL 0.56 0.38 <0.30  Alkaline Phos 40 - 150 U/L 88 103 103  AST 5 - 34 U/L '22 18 20  ' ALT 0 - 55 U/L '18 13 12   ' RADIOLOGY REPORT  MAMMOGRAM AND [name] 06/15/2015 IMPRESSION: 1. The palpable lump in the upper-outer right breast corresponds with a benign oil cyst at the lumpectomy site.  2. No mammographic evidence of malignancy in the bilateral breasts.  RECOMMENDATION: Diagnostic mammogram is suggested in 1 year. (Code:DM-B-01Y)  Left hip MRI without contrast 04/26/2015 IMPRESSION: 1. No hip fracture, dislocation or avascular necrosis. 2. 2.5 cm signal abnormality in the left gluteus maximus muscle with surrounding edema most concerning for a small intramuscular hematoma versus less likely infection.   ASSESSMENT AND PLAN:  70 year old female with 2 primary malignancies:  1.  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 - I encouraged her to continue annual screening mammogram.  - I encouraged her to Have healthy diet and exercise regularly -Patient will continue Arimidex 1 mg daily for a duration of 5 years, until Sep 2017  2. History of stage II adenocarcinoma of the right colon  -status post segmental resection in 04/2011 -repeated CEA levels have been normal -she had CT CAP on 04/14/2015 with contrast for dyspnea and  Leg swelling,  Scan was negative for recurrent disease. - she has been 4.5 years out of the initial surgery, the risk of recurrence has significantly decreased. -Continue surveillance for a total of 5 years   3. Iron deficient anemia -Her Hb dropped to 8.5 in 04/2015, lab from 1/3/2017showed low serum iron at 14, elevated TIBC 483, and low saturation 3%,  Ferritin was 19, this is consistent wit iron deficient anemia - Her anemia has resolved after IV Nathen May -I encourage her to take OTC iron pill but she declined due to the concern of constipation  - she had EGD and colonoscopy in 05/2015 which were unremarkable except a 10 mm erosion in the prepyloric region of systemic, biopsy was done, it showed reactive gastropathy. -The etiology of her iron deficiency is still unclear, patient does not want more work up such as capsule endoscopy -We'll continue follow-up her CBC and iron studies.  4. HTN -She will continue follow up with her PCP  5. Left hip pain -her left hip MRI did not show no abnormal bone lesions. No evidence of cancer metastasis. - she was found to have a 2.5 cm abnormality in the left gluteus , possible hematoma - she will follow-up with an orthopedicsurgeon  6. CKD, stage III -follow up with PCP    PLAN:  -Continue anastrozole until September 2017 -RTC in 3 months for follow up and lab   All questions were answered. The patient knows to call the clinic with any problems, questions or concerns. We can certainly see the patient much sooner if necessary.  I spent 20 minutes counseling the patient face to face. The total time spent in the appointment was 30 minutes.   Truitt Merle 10/25/2015

## 2015-10-25 NOTE — Telephone Encounter (Signed)
pt here today for appt she CX because she stated on 5/2 she did not see the need on keeping the June appt-talked w/DR Burr Medico and shwe agreed to see pt since she was already here. Sch pt appt for today

## 2015-11-03 ENCOUNTER — Encounter: Payer: Self-pay | Admitting: Neurology

## 2015-11-05 ENCOUNTER — Ambulatory Visit: Payer: No Typology Code available for payment source | Admitting: Internal Medicine

## 2015-12-24 ENCOUNTER — Encounter: Payer: Self-pay | Admitting: Family Medicine

## 2015-12-24 ENCOUNTER — Ambulatory Visit (INDEPENDENT_AMBULATORY_CARE_PROVIDER_SITE_OTHER): Payer: Medicare Other | Admitting: Family Medicine

## 2015-12-24 VITALS — BP 162/75 | HR 54 | Ht 60.0 in | Wt 118.0 lb

## 2015-12-24 DIAGNOSIS — M533 Sacrococcygeal disorders, not elsewhere classified: Secondary | ICD-10-CM | POA: Diagnosis not present

## 2015-12-24 MED ORDER — METHYLPREDNISOLONE ACETATE 40 MG/ML IJ SUSP
40.0000 mg | Freq: Once | INTRAMUSCULAR | Status: AC
  Administered 2015-12-24: 40 mg via INTRA_ARTICULAR

## 2015-12-24 NOTE — Patient Instructions (Signed)
You had an injection into the sacroiliac joint on the left today. Call us or go to the EMERGENCY room if you develop fever > 101.5, severe pain or other new symptoms. I would like to see you back in 2-4 weeks. Please call me if you have any questions or issues.  Great to meet you!

## 2015-12-27 DIAGNOSIS — M533 Sacrococcygeal disorders, not elsewhere classified: Secondary | ICD-10-CM | POA: Insufficient documentation

## 2015-12-27 NOTE — Progress Notes (Signed)
[name] - 70 y.o. female MRN [name]  Date of birth: [birth year]    SUBJECTIVE:     Chief Complaint: Left posterior hip pain  HPI: Chronic but worsening left buttock and hip pain. This has been problematic for her for greater than a year. At one point she had a steroid injection into her hip joint done at [facility] which did not initially seemed to help but after about 2 weeks she did notice some relief. She is here today to request similar injection.  Pain is mostly posterior over the buttock area. Chronic and that's there all the time 6-8 out of 10 at its worst. Never much better than 2-3 out of 10. Aching in nature. Worse with standing or walking. She does not have much improvement with sitting but does have some improvement with lying as long as she does not lie on that side. Does not have radiation into the leg. Has not had urinary or fecal incontinence. No numbness or tingling in the left leg or foot. She's noted no rash or lesions in the buttock area. ROS:     No unusual weight change, denies fever, sweats, chills. See history of present illness above for additional pertinent review of systems.  PERTINENT  PMH / PSH FH / / SH:  Past Medical, Surgical, Social, and Family History Reviewed & Updated in the EMR.  Pertinent findings include:  History of breast cancer, history of colon cancer. Renal insufficiency that is chronic. History of opioid use. History of subdural hematoma. History of GI bleed.  REVIEW OF RECORDS: I reviewed records from her PCPs office which included the records from her injection at [facility]. Interestingly, this injection appeared to be an anterior hip injection by the documentation. When she described it to me, she described a posterior or lateral approach. OBJECTIVE: BP (!) 162/75   Pulse (!) 54   Ht 5' (1.524 m)   Wt 118 lb (53.5 kg)   BMI 23.05 kg/m   Physical Exam:  Vital signs are reviewed. GEN.: Thin elderly female, no acute distress. BACK: Some notable  kyphosis. She is nontender to palpation and nontender to percussion over the thoracic and lumbar vertebra. BUTTOCK: Tender to palpation over the sacroiliac joint on the left. No tenderness to palpation in the area of the piriformis. No tenderness to palpation over the greater trochanter. Hip squeeze test is positive as is Corky Sox on the left. HIPS: Internal Rotation is mildly limited but painless. MSK: Hip flexors strength 5 out of 5 bilaterally. Knee flexion extension 5 out of 5 bilaterally. SKIN: Area in the left buttock, left hip and lower back is without any sign of lesion, no rash, no increased warmth, no erythema, no induration. NEURO: Intact sensation to soft touch bilaterally in the feet. Seated straight leg raise negative bilaterally. VASCULAR: Dorsalis pedis pulses 1-2+ bilaterally symmetrical.  INJECTION: Patient was given informed consent, signed copy in the chart. Appropriate time out was taken. Area prepped and draped in usual sterile fashion. 1 cc of methylprednisolone 40 mg/ml plus  4 cc of 1% lidocaine without epinephrine was injected into the left sacroiliac joint using a(n) ultrasound-guided approach. The patient tolerated the procedure well. There were no complications. Post procedure instructions were given.   ASSESSMENT & PLAN:  #1. Chronic left hip/buttock pain. On my examination today this really appears to be mostly consistent with sacroiliac joint dysfunction. The record review indicates that she most likely had an anterior true hip joint injection while at [facility] although  her description of those significantly different from what was listed. Given this history, I initially thought about repeating another true hip joint injection but after examining her, it seems like the sacroiliac joint is the problem so we did a corticosteroid injection there today under ultrasound guidance.  I have asked her to come back in 2-4 weeks so we can reassess potential benefit of this injection.  She should call the interim with any new or worsening symptoms.

## 2016-01-21 ENCOUNTER — Encounter: Payer: Self-pay | Admitting: Family Medicine

## 2016-01-21 ENCOUNTER — Ambulatory Visit (INDEPENDENT_AMBULATORY_CARE_PROVIDER_SITE_OTHER): Payer: Medicare Other | Admitting: Family Medicine

## 2016-01-21 DIAGNOSIS — M549 Dorsalgia, unspecified: Secondary | ICD-10-CM | POA: Insufficient documentation

## 2016-01-21 DIAGNOSIS — M545 Low back pain, unspecified: Secondary | ICD-10-CM

## 2016-01-21 NOTE — Assessment & Plan Note (Signed)
She's had significant surgeries of her lower back that are most likely contributing to the pain that she is feeling her buttock as well as her thigh. Exam does not suggest that she has intra-articular hip problems. Unfortunately we discussed today that we are not able to offer any other interventions to help with her pain.  - Advised that she discuss the use of gabapentin or Lyrica to help with the pain with her PCP. - She can also consider referral to PM&R for the possibility of different procedures for her back  - Advised to follow-up with [name] about other possible therapies for her back.

## 2016-01-21 NOTE — Progress Notes (Signed)
[name] - 70 y.o. female MRN [name]  Date of birth: 07/17/1945  SUBJECTIVE:  Including CC & ROS.  No chief complaint on file.   [name] is a 70 year old female that is presenting with left buttock pain. She was seen last and given an SI joint injection of the left side. She reports minimal to no improvement without injection. She is having the pain that she was having for her initial visit. The pain is reported as burning and hot flash in nature. She also feels in her anterior groin and in her lower left side of her back. It was significant this morning to where it was incredibly painful. This pain has been going on for some time. She takes OxyContin 60 mg every 8 hours on a daily basis. She has a history 2 fusions in her lower back. She saw Dr. Sherwood Gambler earlier this month who said that she should stay away from any more surgeries. She endorses some numbness in the dorsal aspect of both of her feet. She recently has had varicose vein procedure done on her left lower leg.  ROS: No unexpected weight loss, fever, chills, swelling, instability, redness, otherwise see HPI    HISTORY: Past Medical, Surgical, Social, and Family History Reviewed & Updated per EMR.   Pertinent Historical Findings include: PMSHx -  Chronic pain, breast cancer, colon cancer, HTN,  PSHx -  Current tobacco use, no alcohol use, disabled from her back  Medications - amlodipine, requip, oxcontin, lopressor, toprol, diovan, wellbutrin   DATA REVIEWED: 11/29/07: MRI lumbar spine: No significant change from prior MRI.  Satisfactory postoperative fusion of L2-L5.  No change in disc protrusions and mild spinal stenosis at T11-12, T12-L1, and L1-2. 04/02/15: Lumbar x-ray: 1. Chronic post fusion changes of the lumbar spine which appears stable. Chronic degenerative change of the T12-L1 and L1-L2 discs with grade 1 anterolisthesis. No acute fracture nor dislocation. 2. No acute fracture of the sacrum or coccyx. Where  visualized the SI joints are unremarkable. 3. Due to osteopenia, fine bony detail is limited. Further evaluation with CT scanning is available if there are strong clinical concerns of an occult fracture. 04/26/15: MRI left hip: 1. No hip fracture, dislocation or avascular necrosis. 2. 2.5 cm signal abnormality in the left gluteus maximus muscle with surrounding edema most concerning for a small intramuscular hematoma versus less likely infection.   PHYSICAL EXAM:  VS: BP:(!) 165/68  HR:(!) 53bpm  TEMP: ( )  RESP:   HT:5' (152.4 cm)   WT:118 lb (53.5 kg)  BMI:23.1 PHYSICAL EXAM: Gen: NAD, alert, cooperative with exam, elderly female  HEENT: clear conjunctiva, EOMI CV:  no edema, capillary refill brisk,  Resp: non-labored, normal speech Skin: no rashes, normal turgor  Neuro: no gross deficits.  Psych:  alert and oriented Back/HIP:  No tenderness palpation of the lumbar spine. Some tenderness to palpation over the left sided paraspinal muscles. No tenderness to palpation over the piriformis. palpation of the SI joints bilaterally. No tenderness to palpation of the greater trochanter bilaterally. Normal internal and external rotation of the hips bilaterally. Normal knee flexion and extension. Normal hip flexion bilaterally. Normal strength in lower extremity is bilaterally. +1 deep tendon reflexes of the patellar and Achilles bilaterally. +1 pulses distally laterally. Negative straight leg raise bilaterally. Normal gait.  ASSESSMENT & PLAN:   Back pain She's had significant surgeries of her lower back that are most likely contributing to the pain that she is feeling her buttock as well  as her thigh. Exam does not suggest that she has intra-articular hip problems. Unfortunately we discussed today that we are not able to offer any other interventions to help with her pain.  - Advised that she discuss the use of gabapentin or Lyrica to help with the pain with her PCP. - She can also  consider referral to PM&R for the possibility of different procedures for her back  - Advised to follow-up with Dr. Sherwood Gambler about other possible therapies for her back.

## 2016-01-21 NOTE — Patient Instructions (Addendum)
Please talk to your primary doctor about starting gabapentin or lyrica.   You can also see if a referral to Charlett Blake, MD Physical Medicine & Rehabilitation, would be appropriate.   Please follow up with Dr. Sherwood Gambler to discuss options in regards to medications and if any nerve ablations would be appropriate.

## 2016-01-23 NOTE — Progress Notes (Signed)
[facility]: Attending Note: I have reviewed the chart, discussed wit the Sports Medicine Fellow. I agree with assessment and treatment plan as detailed in the [place] note. Unfortunately the sacroliliac injection we gave her did not seem to make a difference in her pain for more than 1-2 days. I am not sure if a nerve root rhizotomy or similar procedure would be a possibility for her---we do not do those so I recommeded she return to Dr Donnella Bi care and discuss other options. She is not on gabapentin and that could be an option. Interventional radiology and PM&R sometimes do other back injections such as epidurals and facet joints but I think her NSU would have best idea of what else might be helpful for her. I do not think the hip or the SI joint are major etiologies of her current pain.

## 2016-01-25 ENCOUNTER — Ambulatory Visit (HOSPITAL_BASED_OUTPATIENT_CLINIC_OR_DEPARTMENT_OTHER): Payer: Medicare Other | Admitting: Hematology

## 2016-01-25 ENCOUNTER — Other Ambulatory Visit (HOSPITAL_BASED_OUTPATIENT_CLINIC_OR_DEPARTMENT_OTHER): Payer: Medicare Other

## 2016-01-25 ENCOUNTER — Encounter: Payer: Self-pay | Admitting: Hematology

## 2016-01-25 VITALS — BP 160/53 | HR 60 | Temp 98.1°F | Resp 17 | Ht 60.0 in | Wt 124.2 lb

## 2016-01-25 DIAGNOSIS — C182 Malignant neoplasm of ascending colon: Secondary | ICD-10-CM

## 2016-01-25 DIAGNOSIS — Z85038 Personal history of other malignant neoplasm of large intestine: Secondary | ICD-10-CM

## 2016-01-25 DIAGNOSIS — D509 Iron deficiency anemia, unspecified: Secondary | ICD-10-CM

## 2016-01-25 DIAGNOSIS — N183 Chronic kidney disease, stage 3 (moderate): Secondary | ICD-10-CM

## 2016-01-25 DIAGNOSIS — C50911 Malignant neoplasm of unspecified site of right female breast: Secondary | ICD-10-CM

## 2016-01-25 LAB — CBC & DIFF AND RETIC
BASO%: 0.6 % (ref 0.0–2.0)
BASOS ABS: 0 10*3/uL (ref 0.0–0.1)
EOS%: 1 % (ref 0.0–7.0)
Eosinophils Absolute: 0.1 10*3/uL (ref 0.0–0.5)
HEMATOCRIT: 39.2 % (ref 34.8–46.6)
HGB: 13.5 g/dL (ref 11.6–15.9)
IMMATURE RETIC FRACT: 0.6 % — AB (ref 1.60–10.00)
LYMPH#: 1.4 10*3/uL (ref 0.9–3.3)
LYMPH%: 26.1 % (ref 14.0–49.7)
MCH: 31.3 pg (ref 25.1–34.0)
MCHC: 34.4 g/dL (ref 31.5–36.0)
MCV: 91 fL (ref 79.5–101.0)
MONO#: 0.4 10*3/uL (ref 0.1–0.9)
MONO%: 6.7 % (ref 0.0–14.0)
NEUT#: 3.4 10*3/uL (ref 1.5–6.5)
NEUT%: 65.6 % (ref 38.4–76.8)
PLATELETS: 226 10*3/uL (ref 145–400)
RBC: 4.31 10*6/uL (ref 3.70–5.45)
RDW: 12.4 % (ref 11.2–14.5)
RETIC %: 0.97 % (ref 0.70–2.10)
RETIC CT ABS: 41.81 10*3/uL (ref 33.70–90.70)
WBC: 5.2 10*3/uL (ref 3.9–10.3)

## 2016-01-25 LAB — COMPREHENSIVE METABOLIC PANEL
ALT: 15 U/L (ref 0–55)
ANION GAP: 10 meq/L (ref 3–11)
AST: 23 U/L (ref 5–34)
Albumin: 3.3 g/dL — ABNORMAL LOW (ref 3.5–5.0)
Alkaline Phosphatase: 79 U/L (ref 40–150)
BILIRUBIN TOTAL: 0.49 mg/dL (ref 0.20–1.20)
BUN: 44.8 mg/dL — AB (ref 7.0–26.0)
CALCIUM: 9.8 mg/dL (ref 8.4–10.4)
CO2: 23 meq/L (ref 22–29)
CREATININE: 1.6 mg/dL — AB (ref 0.6–1.1)
Chloride: 107 mEq/L (ref 98–109)
EGFR: 32 mL/min/{1.73_m2} — ABNORMAL LOW (ref >90)
Glucose: 153 mg/dl — ABNORMAL HIGH (ref 70–140)
Potassium: 3.8 mEq/L (ref 3.5–5.1)
Sodium: 140 mEq/L (ref 136–145)
TOTAL PROTEIN: 6.2 g/dL — AB (ref 6.4–8.3)

## 2016-01-25 LAB — IRON AND TIBC
%SAT: 41 % (ref 21–57)
Iron: 156 ug/dL — ABNORMAL HIGH (ref 41–142)
TIBC: 376 ug/dL (ref 236–444)
UIBC: 220 ug/dL (ref 120–384)

## 2016-01-25 LAB — FERRITIN: Ferritin: 50 ng/ml (ref 9–269)

## 2016-01-25 LAB — CEA (IN HOUSE-CHCC): CEA (CHCC-IN HOUSE): 5.03 ng/mL — AB (ref 0.00–5.00)

## 2016-01-25 NOTE — Progress Notes (Signed)
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 At her final pathology revealed a low grade invasive ductal carcinoma with with 0 of 2 lymph nodes positive for metastatic disease. The tumor was ER positive PR positive HER-2/neu negative.  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 Post lumpectomy patient went on to receive radiation therapy from 12/28/2010 2 01/18/2011 to the right breast. She thereafter received single agent Arimidex 1 mg daily starting on 02/06/2011.  #2 05/01/2011 patient developed generalized weakness shortness of breath and syncopal episode. She had a CT of the head performed that was negative. She had CBC done with that showed a hemoglobin of 6.7. She received 3 units of packed red cells. She was severely iron deficient. FOB was positive. Patient had a GI consult performed during her hospitalization and on 12/12 she underwent a colonoscopy that revealed a near obstructing colonic mass worrisome for a hepatic flexure tumor. She had biopsies performed that revealed an adenocarcinoma. She went on to have a right segmental resection performed on 05/05/2011. The final pathology revealed a 5.0 cm poorly differentiated high grade  invasive adenocarcinoma with patchy mucinous features invading through the muscularis propria into pericolonic fat he tissue 34 lymph nodes were negative for metastatic disease. (T3 N0)  CURRENT THERAPY:  1. Breast cancer: arimidex, started on 01/27/2011, will complete in 01/2016 2. Colon Cancer: observation 3.  Feraheme as needed for iron deficient anemia, she received on 06/07/15, 06/14/2015  INTERVAL HISTORY: [name] 70 y.o. female with history of anemia, breast and colon cancer returns for followup visit today. I last saw her three months ago. She is here for follow up.   Her main complain is still low back and left hip pain. She has been seeing her orthopedic surgeon, and is being referred to a pain specialist for her pain, and she recently received a cortisone shot, which did not help much. No there new pain, or other complains. Her BP meds has been adjusted lately by her cardiologist for her uncontrolled hypertension, she developed leg edema from amlodipine, and she will talk to her cardiologist again. He otherwise doing well, tolerating Arimidex well, she will complete this month.    MEDICAL HISTORY: Past Medical History:  Diagnosis Date  . Back pain, chronic   . Breast cancer ([place]) 09/09/10  . Cancer of colon ([place]) 05/24/2011  . CKD (chronic kidney disease), stage III   . Depression   . GIB (gastrointestinal bleeding)   . Hypertension   . SDH (subdural hematoma) ([facility])     ALLERGIES:  has No Known Allergies.  MEDICATIONS:  Current Outpatient Prescriptions  Medication Sig Dispense Refill  . amLODipine (NORVASC) 10 MG tablet Take 10 mg by mouth daily. Pt takes at night.    Marland Kitchen anastrozole (ARIMIDEX) 1 MG tablet TAKE ONE (  1) TABLET BY MOUTH AT BEDTIME 90 tablet 1  . buPROPion (WELLBUTRIN XL) 150 MG 24 hr tablet Take 150 mg by mouth daily.    Marland Kitchen docusate sodium (COLACE) 100 MG capsule Take 200 mg by mouth daily as needed.     . metoprolol tartrate (LOPRESSOR) 25 MG tablet Take  25 mg by mouth 2 (two) times daily.     Marland Kitchen oxyCODONE (OXYCONTIN) 60 MG 12 hr tablet Take 1 tablet by mouth every 8 (eight) hours.    Marland Kitchen rOPINIRole (REQUIP) 0.5 MG tablet Take 0.5 mg by mouth at bedtime as needed ("restlessness").     . valsartan-hydrochlorothiazide (DIOVAN-HCT) 320-25 MG tablet Take 1 tablet by mouth daily.      No current facility-administered medications for this visit.     SURGICAL HISTORY:  Past Surgical History:  Procedure Laterality Date  . BACK SURGERY    . BREAST LUMPECTOMY  10/10/2010   Rt Breast  . COLON SURGERY  04/2011  . COLONOSCOPY  05/03/2011   Procedure: COLONOSCOPY;  Surgeon: Jeryl Columbia, MD;  Location: [facility] ENDOSCOPY;  Service: Endoscopy;  Laterality: N/A;  . JOINT REPLACEMENT     R&L total shoulder replacements  . LUMBAR DISC SURGERY    . PARTIAL HYSTERECTOMY    . SHOULDER SURGERY    . TUMOR REMOVAL  Tumor removed R breast    REVIEW OF SYSTEMS:  Constitutional: Denies fevers, chills or abnormal weight loss Eyes: Denies blurriness of vision Ears, nose, mouth, throat, and face: Denies mucositis or sore throat Respiratory: Denies cough, dyspnea or wheezes Cardiovascular: Denies palpitation, chest discomfort or lower extremity swelling Gastrointestinal:  Denies nausea, heartburn or change in bowel habits, (+) low appetite  Skin: Denies abnormal skin rashes Lymphatics: Denies new lymphadenopathy or easy bruising Neuromuscular:Denies numbness, tingling or new weaknesses, (+) left hip pain  Behavioral/Psych: Mood is stable, no new changes  All other systems were reviewed with the patient and are negative.  PHYSICAL EXAMINATION:  ECOG PERFORMANCE STTUS: 1 - Symptomatic but completely ambulatory Blood pressure (!) 160/53, pulse 60, temperature 98.1 F (36.7 C), temperature source Oral, resp. rate 17, height 5' (1.524 m), weight 124 lb 3.2 oz (56.3 kg), SpO2 97 %. Patient is awake alert in no acute distress.  HEENT exam: EOMI PERRLA sclerae anicteric  no conjunctival pallor oral mucosa is moist. Neck: Supple no palpable cervical supraclavicular or axillary adenopathy. Lungs are clear bilaterally to auscultation and percussion cardiovascular Cardiovascular: Regular rate rhythm no murmurs gallops or rubs. Abdomen is soft nontender nondistended bowel sounds are present well-healed surgical scar. There is no hepatosplenomegaly. Extremities Mild edema there is noted to be some tenderness bilaterally in the calf. Neuro patient's alert oriented x3 otherwise nonfocal. Patient does has considerable amount of anxiety Breasts: Breast inspection showed them to be symmetrical with no nipple discharge.  There is a small mass underneat th surgical srar in right breast. Palpation of the left breast and axilla revealed no obvious mass that I could appreciate.   LABORATORY DATA: CBC Latest Ref Rng & Units 01/25/2016 10/25/2015 09/21/2015  WBC 3.9 - 10.3 10e3/uL 5.2 5.3 7.2  Hemoglobin 11.6 - 15.9 g/dL 13.5 13.3 15.0  Hematocrit 34.8 - 46.6 % 39.2 39.7 43.9  Platelets 145 - 400 10e3/uL 226 227 235    CMP Latest Ref Rng & Units 09/21/2015 06/23/2015 05/19/2015  Glucose 70 - 140 mg/dl 103 102 91  BUN 7.0 - 26.0 mg/dL 22.5 18.6 14.5  Creatinine 0.6 - 1.1 mg/dL 1.2(H) 1.1  1.5(H)  Sodium 136 - 145 mEq/L 141 140 141  Potassium 3.5 - 5.1 mEq/L 3.8 4.7 3.7  Chloride 101 - 111 mmol/L - - -  CO2 22 - 29 mEq/L _0 Calcium 8.4 - 10.4 mg/dL 9.8 10.1 9.6  Total Protein 6.4 - 8.3 g/dL 6.6 6.7 6.7  Total Bilirubin 0.20 - 1.20 mg/dL 0.56 0.38 <0.30  Alkaline Phos 40 - 150 U/L 88 103 103  AST 5 - 34 U/L _1 ALT 0 - 55 U/L _2 RADIOLOGY REPORT  MAMMOGRAM AND [name] 06/15/2015 IMPRESSION: 1. The palpable lump in the upper-outer right breast corresponds with a benign oil cyst at the lumpectomy site.  2. No mammographic evidence of malignancy in the bilateral breasts.  RECOMMENDATION: Diagnostic mammogram is suggested in 1 year. (Code:DM-B-01Y)  Left  hip MRI without contrast 04/26/2015 IMPRESSION: 1. No hip fracture, dislocation or avascular necrosis. 2. 2.5 cm signal abnormality in the left gluteus maximus muscle with surrounding edema most concerning for a small intramuscular hematoma versus less likely infection.   ASSESSMENT AND PLAN: 70 year old female with 2 primary malignancies:  1.  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 - I encouraged her to continue annual screening mammogram.  - I encouraged her to Have healthy diet and exercise regularly  2. History of stage II adenocarcinoma of the right colon  -status post segmental resection in 04/2011 -repeated CEA levels have been normal -she had CT CAP on 04/14/2015 with contrast for dyspnea and  Leg swelling,  Scan was negative for recurrent disease. - she has been close to 5 years out of the initial surgery, the risk of recurrence has significantly decreased. -Continue surveillance for a total of 5 years   3. Iron deficient anemia -Her Hb dropped to 8.5 in 04/2015, lab from 1/3/2017showed low serum iron at 14, elevated TIBC 483, and low saturation 3%,  Ferritin was 19, this is consistent wit iron deficient anemia - Her anemia has resolved after IV Nathen May -I encourage her to take OTC iron pill but she declined due to the concern of constipation  - she had EGD and colonoscopy in 05/2015 which were unremarkable except a 10 mm erosion in the prepyloric region of systemic, biopsy was done, it showed reactive gastropathy. -The etiology of her iron deficiency is still unclear, patient does not want more work up such as capsule endoscopy -We'll continue follow-up her CBC and iron studies. Her hemoglobin is normal today, iron study results are still pending  4.  HTN -She will continue follow up with her PCP and her cardiologist.  5. Left hip pain -her left hip MRI did not show no abnormal bone lesions. No evidence of cancer metastasis. - she was found to have a 2.5 cm abnormality in the left gluteus , possible hematoma - she will follow-up with an orthopedic surgeon, she's been referred to pain clinic also.  6. CKD, stage III -follow up with PCP   PLAN:  -Continue anastrozole until the end of this month -lab results reviewed her today -Lab in 3 months, follow-up and lab in 6 months, if her anemia stable, I will see her on annual bases after next visit   All questions were answered. The  patient knows to call the clinic with any problems, questions or concerns. We can certainly see the patient much sooner if necessary.  I spent 20 minutes counseling the patient face to face. The total time spent in the appointment was 30 minutes.   Truitt Merle 01/25/2016

## 2016-01-26 LAB — CEA (PARALLEL TESTING): CEA: 3.3 ng/mL — ABNORMAL HIGH

## 2016-01-31 ENCOUNTER — Other Ambulatory Visit: Payer: Self-pay | Admitting: Hematology

## 2016-01-31 NOTE — Telephone Encounter (Signed)
Received call from pt asking about refilling her anastrozole.  She reports that she is 5 short to complete the month & she is to finish/graduate.  Discussed with Dr Burr Medico & OK to finish med she has & then stop.

## 2016-02-17 ENCOUNTER — Telehealth: Payer: Self-pay | Admitting: Hematology

## 2016-02-17 NOTE — Telephone Encounter (Signed)
Left message for patient re lab/fu for April 2018. Patient was also scheduled for lab10/3 which she called and cxd. Also left message for patient asking that she call office re rescheduling 10/3 lab and letting us know when she can come in. Schedule mailed

## 2016-02-17 NOTE — Telephone Encounter (Signed)
10/03 Appointment rescheduled for the patient after explaining why the appointment was scheduled. Patient called with concern as to why the 10/03 lab appointment needed to be scheduled.

## 2016-02-22 ENCOUNTER — Other Ambulatory Visit: Payer: No Typology Code available for payment source

## 2016-02-22 ENCOUNTER — Other Ambulatory Visit (HOSPITAL_BASED_OUTPATIENT_CLINIC_OR_DEPARTMENT_OTHER): Payer: Medicare Other

## 2016-02-22 DIAGNOSIS — D509 Iron deficiency anemia, unspecified: Secondary | ICD-10-CM

## 2016-02-22 LAB — CBC & DIFF AND RETIC
BASO%: 0.4 % (ref 0.0–2.0)
Basophils Absolute: 0 10*3/uL (ref 0.0–0.1)
EOS%: 2 % (ref 0.0–7.0)
Eosinophils Absolute: 0.1 10*3/uL (ref 0.0–0.5)
HCT: 39 % (ref 34.8–46.6)
HGB: 13.3 g/dL (ref 11.6–15.9)
Immature Retic Fract: 1.5 % — ABNORMAL LOW (ref 1.60–10.00)
LYMPH%: 30.9 % (ref 14.0–49.7)
MCH: 31.7 pg (ref 25.1–34.0)
MCHC: 34.1 g/dL (ref 31.5–36.0)
MCV: 93.1 fL (ref 79.5–101.0)
MONO#: 0.7 10*3/uL (ref 0.1–0.9)
MONO%: 12 % (ref 0.0–14.0)
NEUT%: 54.7 % (ref 38.4–76.8)
NEUTROS ABS: 3 10*3/uL (ref 1.5–6.5)
PLATELETS: 203 10*3/uL (ref 145–400)
RBC: 4.19 10*6/uL (ref 3.70–5.45)
RDW: 13 % (ref 11.2–14.5)
Retic %: 1.43 % (ref 0.70–2.10)
Retic Ct Abs: 59.92 10*3/uL (ref 33.70–90.70)
WBC: 5.5 10*3/uL (ref 3.9–10.3)
lymph#: 1.7 10*3/uL (ref 0.9–3.3)

## 2016-02-22 LAB — FERRITIN: FERRITIN: 38 ng/mL (ref 9–269)

## 2016-05-12 ENCOUNTER — Other Ambulatory Visit (INDEPENDENT_AMBULATORY_CARE_PROVIDER_SITE_OTHER): Payer: Medicare Other

## 2016-05-12 ENCOUNTER — Ambulatory Visit (INDEPENDENT_AMBULATORY_CARE_PROVIDER_SITE_OTHER): Payer: Medicare Other | Admitting: Neurology

## 2016-05-12 ENCOUNTER — Encounter: Payer: Self-pay | Admitting: Neurology

## 2016-05-12 VITALS — BP 140/80 | HR 62 | Ht 60.0 in | Wt 127.5 lb

## 2016-05-12 DIAGNOSIS — R202 Paresthesia of skin: Secondary | ICD-10-CM | POA: Diagnosis not present

## 2016-05-12 DIAGNOSIS — M791 Myalgia: Secondary | ICD-10-CM

## 2016-05-12 DIAGNOSIS — M7918 Myalgia, other site: Secondary | ICD-10-CM

## 2016-05-12 LAB — VITAMIN B12: Vitamin B-12: 128 pg/mL — ABNORMAL LOW (ref 211–911)

## 2016-05-12 LAB — CK: Total CK: 57 U/L (ref 7–177)

## 2016-05-12 NOTE — Progress Notes (Signed)
[facility] Clinic Note - Initial Visit   Date: 05/12/16  [name] MRN: [id number] DOB: February 09, 1946   Dear Dr. Hardin Negus:  Thank you for your kind referral of [name] for consultation of left buttocks paresthesias and pain. Although her history is well known to you, please allow [name] to reiterate it for the purpose of our medical record. The patient was accompanied to the clinic by self.    History of Present Illness: [name] is a 70 y.o. right-handed Caucasian female with right breast cancer s/p lumpectomy and radiation (2012), adenocarcinoma of the colon s/p resection (2012), s/p lumbar fusion x 2, hypertension, tobacco use, and depression presenting for evaluation of left buttock paresthesias and pain.    Starting around the end of 2016, she began having localized area of left buttocks tingling and numbness.  This later transitioned into hot-burning pain in the same localized region.  She did not have any injuries, trauma, or illness preceding symptom onset.  Over the past few months, she developed similar symptoms radiating into her left inguinal region and groin.  She denies any radicular symptoms down into her lower legs.  There are no triggering or alleviating factors.  She can be resting, walking, or sitting.  Pain usually last 30-60 minutes.  No associated weakness. She has chronic low back pain for which she sees pain management.  She has tried OTC lidocaine ointments, which helps some.  She has not done PT.   She saw Dr. Sherwood Gambler (Neurosurgery) in June 2017 who repeated imaging (I do not have this to review) and said that she has increased curvature of the spine, but nothing to explain her symptoms.  She also saw Dr. Nori Riis (Sports Medicine) who offered a SI joint injection which provided transient relief.  She sees Dr. Hardin Negus for chronic pain management and reports that her oxycodone does not alleviate her burning pain.  In December  2016, she had a MRI of the left hip which showed increased signal in the gluteus muscle, likely edema or a hematoma. This has not been re-evaluated.   Out-side paper records, electronic medical record, and images have been reviewed where available and summarized as:  Lab Results  Component Value Date   VITAMINB12 237 05/25/2015   Lab Results  Component Value Date   TSH 2.226 11/05/2014   Lab Results  Component Value Date   HGBA1C 6.1 (H) 10/28/2014   MRI left hip wo contrast 04/27/2015: 1. No hip fracture, dislocation or avascular necrosis. 2. 2.5 cm signal abnormality in the left gluteus maximus muscle with surrounding edema most concerning for a small intramuscular hematoma versus less likely infection.  MRI lumbar spine 11/30/2007: No significant change from prior MRI.  Satisfactory postoperative fusion of L2-L5. No change in disc protrusions and mild spinal stenosis at T11-12, T12-L1, and L1-2.  MRI cervical spine 07/19/2005: 1.  There is spondylosis, predominately from C4-5 through C6-7, as described.  There is no resulting cord deformity or high grade foraminal narrowing on the left.  Mild right foraminal narrowing is present at C4-5 and C5-6, and there is mild biforaminal narrowing at C6-7. 2.  Small central disk protrusions at C4-5 and C5-6. 3.  Effusion of the right atlantooccipital joint may contribute to occipital pain -  correlate clinically.  Past Medical History:  Diagnosis Date  . Back pain, chronic   . Breast cancer ([place]) 09/09/10  . Cancer of colon ([place]) 05/24/2011  . CKD (chronic kidney disease), stage  III   . Depression   . GIB (gastrointestinal bleeding)   . Hypertension   . SDH (subdural hematoma) ([facility])     Past Surgical History:  Procedure Laterality Date  . BACK SURGERY    . BREAST LUMPECTOMY  10/10/2010   Rt Breast  . COLON SURGERY  04/2011  . COLONOSCOPY  05/03/2011   Procedure: COLONOSCOPY;  Surgeon: Jeryl Columbia, MD;  [facility] ENDOSCOPY;   Service: Endoscopy;  Laterality: N/A;  . JOINT REPLACEMENT     R&L total shoulder replacements  . LUMBAR DISC SURGERY    . PARTIAL HYSTERECTOMY    . SHOULDER SURGERY    . TUMOR REMOVAL  Tumor removed R breast     Medications:  Outpatient Encounter Prescriptions as of 05/12/2016  Medication Sig Note  . ALPRAZolam (XANAX) 1 MG tablet  05/12/2016: Received from: External Pharmacy  . amLODipine (NORVASC) 10 MG tablet Take 10 mg by mouth daily. Pt takes at night. 01/21/2016: Received from: External Pharmacy  . anastrozole (ARIMIDEX) 1 MG tablet TAKE ONE (1) TABLET BY MOUTH AT BEDTIME   . buPROPion (WELLBUTRIN XL) 150 MG 24 hr tablet Take 150 mg by mouth daily.   Marland Kitchen docusate sodium (COLACE) 100 MG capsule Take 200 mg by mouth daily as needed.    . furosemide (LASIX) 20 MG tablet Take by mouth. 05/12/2016: Received from: [facility]: Take 1 tablet by mouth as needed. Reported on 07/15/2015  . metoprolol tartrate (LOPRESSOR) 25 MG tablet Take 25 mg by mouth 2 (two) times daily.  01/21/2016: Received from: External Pharmacy  . metoprolol tartrate (LOPRESSOR) 25 mg/10 mL SUSP Take by mouth. 05/12/2016: Received from: [facility]: Take 25 mg by mouth 2 (two) times daily.  . mirtazapine (REMERON) 30 MG tablet  05/12/2016: Received from: External Pharmacy  . oxyCODONE (OXYCONTIN) 60 MG 12 hr tablet Take 1 tablet by mouth every 8 (eight) hours.   . prednisoLONE acetate (PRED FORTE) 1 % ophthalmic suspension  05/12/2016: Received from: External Pharmacy  . PROLENSA 0.07 % SOLN  05/12/2016: Received from: External Pharmacy  . RESTASIS MULTIDOSE 0.05 % ophthalmic emulsion  05/12/2016: Received from: External Pharmacy  . rOPINIRole (REQUIP) 0.5 MG tablet Take 0.5 mg by mouth at bedtime as needed ("restlessness").    . trimethoprim-polymyxin b (POLYTRIM) ophthalmic solution  05/12/2016: Received from: External Pharmacy  . valsartan-hydrochlorothiazide  (DIOVAN-HCT) 320-25 MG tablet Take 1 tablet by mouth daily.  01/21/2016: Received from: External Pharmacy   No facility-administered encounter medications on file as of 05/12/2016.      Allergies: No Known Allergies  Family History: Family History  Problem Relation Age of Onset  . Diabetes Mother   . Hypertension Mother   . Cancer Mother     leukemia  . Sudden death Mother   . Anesthesia problems Neg Hx   . Hypotension Neg Hx   . Malignant hyperthermia Neg Hx   . Pseudochol deficiency Neg Hx     Social History: Social History  Substance Use Topics  . Smoking status: Current Every Day Smoker    Packs/day: 0.75    Years: 30.00    Types: Cigarettes  . Smokeless tobacco: Former Systems developer    Quit date: 04/22/2010  . Alcohol use No   Social History   Social History Narrative   Lives alone in a one story home.  Has 2 children.     On disability for low back pain since the age  of 51.     Education: high school.    Review of Systems:  CONSTITUTIONAL: No fevers, chills, night sweats, or weight loss.   EYES: No visual changes or eye pain ENT: No hearing changes.  No history of nose bleeds.   RESPIRATORY: No cough, wheezing and shortness of breath.   CARDIOVASCULAR: Negative for chest pain, and palpitations.   GI: Negative for abdominal discomfort, blood in stools or black stools.  No recent change in bowel habits.   GU:  No history of incontinence.   MUSCLOSKELETAL: +history of joint pain or swelling.  No myalgias.   SKIN: Negative for lesions, rash, and itching.   HEMATOLOGY/ONCOLOGY: Negative for prolonged bleeding, bruising easily, and swollen nodes.  +history of cancer.   ENDOCRINE: Negative for cold or heat intolerance, polydipsia or goiter.   PSYCH:  No depression or anxiety symptoms.   NEURO: As Above.   Vital Signs:  BP 140/80   Pulse 62   Ht 5' (1.524 m)   Wt 127 lb 8 oz (57.8 kg)   SpO2 96%   BMI 24.90 kg/m    General Medical Exam:   General:  Well appearing,  strong odor of tobacco, comfortable.   Eyes/ENT: see cranial nerve examination.   Neck: No masses appreciated.  Full range of motion without tenderness.  No carotid bruits. Respiratory:  Clear to auscultation, good air entry bilaterally.   Cardiac:  Regular rate and rhythm, no murmur.   Extremities:  No deformities, +1 pitting edema of the legs, or skin discoloration.  Skin:  No rashes or lesions.  Neurological Exam: MENTAL STATUS including orientation to time, place, person, recent and remote memory, attention span and concentration, language, and fund of knowledge is normal.  Speech is not dysarthric.  CRANIAL NERVES: II:  No visual field defects.  Unremarkable fundi.   III-IV-VI: Pupils equal round and reactive to light.  Normal conjugate, extra-ocular eye movements in all directions of gaze.  No nystagmus.  No ptosis.   V:  Normal facial sensation.    VII:  Normal facial symmetry and movements. VIII:  Normal hearing and vestibular function.   IX-X:  Normal palatal movement.   XI:  Normal shoulder shrug and head rotation.   XII:  Normal tongue strength and range of motion, no deviation or fasciculation.  MOTOR:  No atrophy, fasciculations or abnormal movements.  No pronator drift.  Tone is normal.    Right Upper Extremity:    Left Upper Extremity:    Deltoid  5/5   Deltoid  5/5   Biceps  5/5   Biceps  5/5   Triceps  5/5   Triceps  5/5   Wrist extensors  5/5   Wrist extensors  5/5   Wrist flexors  5/5   Wrist flexors  5/5   Finger extensors  5/5   Finger extensors  5/5   Finger flexors  5/5   Finger flexors  5/5   Dorsal interossei  5/5   Dorsal interossei  5/5   Abductor pollicis  5/5   Abductor pollicis  5/5   Tone (Ashworth scale)  0  Tone (Ashworth scale)  0   Right Lower Extremity:    Left Lower Extremity:    Hip flexors  5/5   Hip flexors  5/5   Hip extensors  5/5   Hip extensors  5/5   Adductor 5/5  Adductor 5/5  Abductor 5/5  Abductor 5/5  Knee flexors  5/5   Knee  flexors  5/5   Knee extensors  5/5   Knee extensors  5/5   Dorsiflexors  5/5   Dorsiflexors  5/5   Plantarflexors  5/5   Plantarflexors  5/5   Toe extensors  5/5   Toe extensors  5/5   Toe flexors  5/5   Toe flexors  5/5   Tone (Ashworth scale)  0  Tone (Ashworth scale)  0   MSRs:  Right                                                                 Left brachioradialis 2+  brachioradialis 2+  biceps 2+  biceps 2+  triceps 2+  triceps 2+  patellar 2+  patellar 2+  ankle jerk 1+  ankle jerk 1+  Hoffman no  Hoffman no  plantar response down  plantar response down   SENSORY:  Normal and symmetric perception of light touch, pinprick, vibration, and proprioception.  Romberg's sign absent.   COORDINATION/GAIT: Normal finger-to- nose-finger and heel-to-shin.  Intact rapid alternating movements bilaterally.  Stooped posture, gait is slow, unassisted and stable.      IMPRESSION: This is a 70 year-old female referred for evaluation of intermittent left buttocks parethesias.  Symptoms started in the fall of 2016 and have progressed to involve her left inguinal and groin region.  She has a history of lumbar surgeries x 2 and had MRI lumbar spine in the summer of 2017, which I will request to review.  With her now radiating symptoms, a high lumbar L1-2 radiculopathy is possible.  However, when she first described her isolated left buttocks pain, the possibility of localize nerve irritation such as the superior cluneal nerve was considered.  This, however, would certainly not involve the inguinal or groin area.  It is interesting that her MRI of the left hip showed hyperintensity and edema of the left gluteal muscle.  She does recall suffering a fall and these changes may be secondary to a hematoma.  I will check muscle enzymes to see if there is any evidence of muscle irritation which could suggestive infiltrative process such as sarcoidosis or amylodosis.  If her CK is elevated, repeat imaging would be  helpful.   PLAN/RECOMMENDATIONS:  1.  Check CK, aldolase, vitamin B12.  If CK is elevated, check SPEP with IFE and ACE 2.  We will obtain MRI lumbar spine report and images 3.  Consider NCS/EMG going forward, keeping in mind technical limitations for proximal nerve testing  The duration of this appointment visit was 60 minutes of face-to-face time with the patient.  Greater than 50% of this time was spent in counseling, explanation of diagnosis, planning of further management, and coordination of care.   Thank you for allowing me to participate in patient's care.  If I can answer any additional questions, I would be pleased to do so.    Sincerely,    Wilkin Lippy K. Posey Pronto, DO

## 2016-05-12 NOTE — Patient Instructions (Addendum)
1.  Check blood work 2.  We will request records from Dr. Donnella Bi office  We will call you with further recommendations.  The next step may be nerve testing of the leg.

## 2016-05-16 LAB — ALDOLASE: ALDOLASE: 3.7 U/L (ref <=8.1)

## 2016-05-16 NOTE — Progress Notes (Signed)
Note faxed.

## 2016-05-23 ENCOUNTER — Telehealth: Payer: Self-pay | Admitting: Neurology

## 2016-05-23 NOTE — Telephone Encounter (Signed)
MRI lumbar spine without contrast 11/03/2015: Satisfactory appearance in the fusion segment from L2-L5. Advanced adjacent segment degenerative disease at L1/2. Retrolisthesis of 3 mm. Shallow protrusion of the disc. Facet hypertrophy. Spinal stenosis at this level could cause neural compression. Bilateral facet arthropathy at L5-S1 with 3 mm of anterolisthesis. No compressive stenosis. Significant image degradation on the basis of patient motion and metal artifact.  MRI thoracic spine without contrast 11/03/2015:  No thoracic compression fracture or central canal stenosis. Degenerative disc disease and degenerative facet disease affecting the mid and lower thoracic spine. Findings could certainly relate to back pain. At T12-L1, there is lateral recess stenosis and foraminal stenosis left more than right that could cause neural compression.   MRI report reviewed. She does have low thoracic and high lumbar degenerative changes with potential neural compression, worse on the left. These findings may certainly contribute to her radicular pain and are in the appropriate dermatomal region.  I recommend that she start physical therapy.  If there is no improvement, the next step will be electrodiagnostic testing of the legs.  Donika K. Posey Pronto, DO

## 2016-05-23 NOTE — Telephone Encounter (Signed)
Patient given results and referral sent for PT.

## 2016-08-28 ENCOUNTER — Ambulatory Visit: Payer: No Typology Code available for payment source | Admitting: Hematology

## 2016-08-28 ENCOUNTER — Other Ambulatory Visit: Payer: No Typology Code available for payment source

## 2017-07-02 ENCOUNTER — Other Ambulatory Visit: Payer: Self-pay | Admitting: Hematology

## 2017-07-02 DIAGNOSIS — Z9889 Other specified postprocedural states: Secondary | ICD-10-CM

## 2017-07-03 ENCOUNTER — Other Ambulatory Visit: Payer: Self-pay | Admitting: Physician Assistant

## 2017-07-03 ENCOUNTER — Ambulatory Visit
Admission: RE | Admit: 2017-07-03 | Discharge: 2017-07-03 | Disposition: A | Discharge status: Home / Self Care | Payer: Medicare Other | Source: Ambulatory Visit | Attending: Physician Assistant | Admitting: Physician Assistant

## 2017-07-03 DIAGNOSIS — M25551 Pain in right hip: Secondary | ICD-10-CM

## 2017-07-18 ENCOUNTER — Ambulatory Visit
Admission: RE | Admit: 2017-07-18 | Discharge: 2017-07-18 | Disposition: A | Discharge status: Home / Self Care | Payer: Medicare Other | Source: Ambulatory Visit | Attending: Hematology | Admitting: Hematology

## 2017-07-18 DIAGNOSIS — Z9889 Other specified postprocedural states: Secondary | ICD-10-CM

## 2017-12-03 DIAGNOSIS — M961 Postlaminectomy syndrome, not elsewhere classified: Secondary | ICD-10-CM | POA: Diagnosis not present

## 2017-12-03 DIAGNOSIS — G894 Chronic pain syndrome: Secondary | ICD-10-CM | POA: Diagnosis not present

## 2017-12-03 DIAGNOSIS — Z79891 Long term (current) use of opiate analgesic: Secondary | ICD-10-CM | POA: Diagnosis not present

## 2017-12-03 DIAGNOSIS — M25551 Pain in right hip: Secondary | ICD-10-CM | POA: Diagnosis not present

## 2017-12-19 DIAGNOSIS — M1611 Unilateral primary osteoarthritis, right hip: Secondary | ICD-10-CM | POA: Diagnosis not present

## 2018-01-04 DIAGNOSIS — M25551 Pain in right hip: Secondary | ICD-10-CM | POA: Diagnosis not present

## 2018-01-04 DIAGNOSIS — G894 Chronic pain syndrome: Secondary | ICD-10-CM | POA: Diagnosis not present

## 2018-01-04 DIAGNOSIS — Z79891 Long term (current) use of opiate analgesic: Secondary | ICD-10-CM | POA: Diagnosis not present

## 2018-01-04 DIAGNOSIS — M961 Postlaminectomy syndrome, not elsewhere classified: Secondary | ICD-10-CM | POA: Diagnosis not present

## 2018-01-11 DIAGNOSIS — M7061 Trochanteric bursitis, right hip: Secondary | ICD-10-CM | POA: Diagnosis not present

## 2018-01-18 DIAGNOSIS — C50911 Malignant neoplasm of unspecified site of right female breast: Secondary | ICD-10-CM | POA: Diagnosis not present

## 2018-07-15 ENCOUNTER — Ambulatory Visit (INDEPENDENT_AMBULATORY_CARE_PROVIDER_SITE_OTHER): Payer: Medicare Other | Admitting: Cardiology

## 2018-07-15 ENCOUNTER — Encounter: Payer: Self-pay | Admitting: Cardiology

## 2018-07-15 VITALS — BP 115/59 | HR 50 | Ht 61.0 in | Wt 114.0 lb

## 2018-07-15 DIAGNOSIS — Z8679 Personal history of other diseases of the circulatory system: Secondary | ICD-10-CM | POA: Insufficient documentation

## 2018-07-15 DIAGNOSIS — Z0189 Encounter for other specified special examinations: Secondary | ICD-10-CM | POA: Insufficient documentation

## 2018-07-15 DIAGNOSIS — R0602 Shortness of breath: Secondary | ICD-10-CM

## 2018-07-15 DIAGNOSIS — I739 Peripheral vascular disease, unspecified: Secondary | ICD-10-CM | POA: Diagnosis not present

## 2018-07-15 DIAGNOSIS — Z87891 Personal history of nicotine dependence: Secondary | ICD-10-CM

## 2018-07-15 DIAGNOSIS — I10 Essential (primary) hypertension: Secondary | ICD-10-CM

## 2018-07-15 DIAGNOSIS — J432 Centrilobular emphysema: Secondary | ICD-10-CM | POA: Diagnosis not present

## 2018-07-15 HISTORY — DX: Personal history of other diseases of the circulatory system: Z86.79

## 2018-07-15 HISTORY — DX: Peripheral vascular disease, unspecified: I73.9

## 2018-07-15 HISTORY — DX: Shortness of breath: R06.02

## 2018-07-15 HISTORY — DX: Centrilobular emphysema: J43.2

## 2018-07-15 MED ORDER — ATORVASTATIN CALCIUM 10 MG PO TABS: 10.0000 mg | ORAL_TABLET | Freq: Every day | ORAL | 2 refills | Status: DC

## 2018-07-15 NOTE — Progress Notes (Signed)
Subjective:  Primary Physician:  Antony Contras, MD  Patient ID: [name], female    DOB: Jun 10, 1945, 73 y.o.   MRN: [id number]  Chief Complaint  Patient presents with  . PAD  . Follow-up  . Numbness    HPI: [name]  is a 72 y.o. female  with peripheral artery disease in the right lower leg at the level of tibial vessels and digital vessels is also suspected. On the left side, there is more significant depression of the ankle-brachial index at rest. There likely is a component of aortoiliac inflow disease as well as distal SFA occlusive disease.  Past medical history significant for hypertension, COPD,Stage III chronic kidney disease, history of breast cancer.She has had bilateral venous insufficiency and has had ablation in the past with resolution of leg edema.  She now presents for annual visit.  Except for chronic dyspnea and mild cramping in her calves she has no other specific symptoms, states that she strained eat healthy and is trying to control her blood pressure and also states that she again has quit smoking since December 2019.  Denies dizziness or syncope.    Past Medical History:  Diagnosis Date  . Acute renal failure superimposed on stage 3 chronic kidney disease ([place]) 10/27/2014  . Back pain, chronic   . Breast cancer ([place]) 09/09/10  . Cancer of colon ([place]) 05/24/2011  . Centrilobular emphysema ([place]) 07/15/2018  . CKD (chronic kidney disease), stage III ([place])   . Claudication, intermittent ([place]) 07/15/2018  . Depression   . GIB (gastrointestinal bleeding)   . Hx of varicose veins of lower extremity 07/15/2018  . Hypertension   . SDH (subdural hematoma) ([place])   . SOB (shortness of breath) 07/15/2018    Past Surgical History:  Procedure Laterality Date  . BACK SURGERY    . BREAST LUMPECTOMY  10/10/2010   Rt Breast  . COLON SURGERY  04/2011  . COLONOSCOPY  05/03/2011   Procedure: COLONOSCOPY;  Surgeon: Jeryl Columbia, MD;  Location[facility] END[facility]COPY;   Service: Endoscopy;  Laterality: N/A;  . JOINT REPLACEMENT     R&L total shoulder replacements  . LUMBAR DISC SURGERY    . PARTIAL HYSTERECTOMY    . SHOULDER SURGERY    . TUMOR REMOVAL  Tumor removed R breast    Social History   Socioeconomic History  . Marital status: Divorced    Spouse name: Not on file  . Number of children: 2  . Years of education: Not on file  . Highest education level: Not on file  Occupational History  . Not on file  Social Needs  . Financial resource strain: Not on file  . Food insecurity:    Worry: Not on file    Inability: Not on file  . Transportation needs:    Medical: Not on file    Non-medical: Not on file  Tobacco Use  . Smoking status: Former Smoker    Packs/day: 0.75    Years: 30.00    Pack years: 22.50    Types: Cigarettes    Last attempt to quit: 04/2018    Years since quitting: 0.2  . Smokeless tobacco: Former Systems developer    Quit date: 04/22/2010  Substance and Sexual Activity  . Alcohol use: Yes    Comment: rarely   . Drug use: No  . Sexual activity: Not Currently  Lifestyle  . Physical activity:    Days per week: Not on file    Minutes  per session: Not on file  . Stress: Not on file  Relationships  . Social connections:    Talks on phone: Not on file    Gets together: Not on file    Attends religious service: Not on file    Active member of club or organization: Not on file    Attends meetings of clubs or organizations: Not on file    Relationship status: Not on file  . Intimate partner violence:    Fear of current or ex partner: Not on file    Emotionally abused: Not on file    Physically abused: Not on file    Forced sexual activity: Not on file  Other Topics Concern  . Not on file  Social History Narrative   Lives alone in a one story home.  Has 2 children.     On disability for low back pain since the age of 51.     Education: high school.    Current Outpatient Medications on File Prior to Visit  Medication Sig  Dispense Refill  . ALPRAZolam (XANAX) 1 MG tablet as needed.     Marland Kitchen amLODipine (NORVASC) 10 MG tablet Take 10 mg by mouth daily. Pt takes at night.    Marland Kitchen aspirin EC 81 MG tablet Take 81 mg by mouth daily.    Marland Kitchen buPROPion (WELLBUTRIN XL) 150 MG 24 hr tablet Take 150 mg by mouth daily.    Marland Kitchen docusate sodium (COLACE) 100 MG capsule Take 200 mg by mouth daily as needed.     . furosemide (LASIX) 20 MG tablet Take by mouth as needed.     . hydrALAZINE (APRESOLINE) 25 MG tablet Take 25 mg by mouth 3 (three) times daily.    Marland Kitchen losartan-hydrochlorothiazide (HYZAAR) 100-12.5 MG tablet Take 1 tablet by mouth daily.    . metoprolol tartrate (LOPRESSOR) 25 MG tablet Take 25 mg by mouth 2 (two) times daily.     . mirtazapine (REMERON) 30 MG tablet daily.     Marland Kitchen oxyCODONE (OXYCONTIN) 60 MG 12 hr tablet Take 1 tablet by mouth every 8 (eight) hours.    Marland Kitchen rOPINIRole (REQUIP) 0.5 MG tablet Take 0.5 mg by mouth at bedtime as needed ("restlessness").     Marland Kitchen anastrozole (ARIMIDEX) 1 MG tablet TAKE ONE (1) TABLET BY MOUTH AT BEDTIME (Patient not taking: Reported on 07/15/2018) 90 tablet 1  . metoprolol tartrate (LOPRESSOR) 25 mg/10 mL SUSP Take by mouth.    . prednisoLONE acetate (PRED FORTE) 1 % ophthalmic suspension     . PROLENSA 0.07 % SOLN     . RESTASIS MULTIDOSE 0.05 % ophthalmic emulsion     . trimethoprim-polymyxin b (POLYTRIM) ophthalmic solution     . valsartan-hydrochlorothiazide (DIOVAN-HCT) 320-25 MG tablet Take 1 tablet by mouth daily.      No current facility-administered medications on file prior to visit.      Review of Systems  Constitutional: Positive for malaise/fatigue. Negative for weight loss.  Respiratory: Negative for cough, hemoptysis and shortness of breath.   Cardiovascular: Positive for claudication. Negative for chest pain, palpitations and leg swelling.  Gastrointestinal: Positive for heartburn and nausea. Negative for abdominal pain, blood in stool, constipation and vomiting.    Genitourinary: Negative for dysuria.  Musculoskeletal: Negative for joint pain and myalgias.  Neurological: Negative for dizziness, focal weakness and headaches.  Endo/Heme/Allergies: Does not bruise/bleed easily.  Psychiatric/Behavioral: Negative for depression. The patient is not nervous/anxious.   All other systems reviewed and are negative.  Objective:  Blood pressure (!) 115/59, pulse (!) 50, height 5\' 1"  (1.549 m), weight 114 lb (51.7 kg), SpO2 96 %. Body mass index is 21.54 kg/m.  Physical Exam  Constitutional: No distress.  Petite and frail  Eyes: Conjunctivae are normal.  Neck: Neck supple. No JVD present.  Cardiovascular: Regular rhythm, normal heart sounds and intact distal pulses. Exam reveals no gallop.  No murmur heard. Pulses:      Carotid pulses are 2+ on the right side and 2+ on the left side.      Radial pulses are 2+ on the right side and 2+ on the left side.       Femoral pulses are 2+ on the right side with bruit and 1+ on the left side with bruit.      Popliteal pulses are 0 on the right side and 0 on the left side.       Dorsalis pedis pulses are 2+ on the right side and 0 on the left side.       Posterior tibial pulses are 0 on the right side and 0 on the left side.  Pulmonary/Chest: Effort normal and breath sounds normal. She exhibits no tenderness.  Barrel shaped chest  Abdominal: Soft. Bowel sounds are normal.  Musculoskeletal: Normal range of motion.        General: Edema (stable) present.  Neurological: She is alert and oriented to person, place, and time.  Skin: Skin is warm.   CARDIAC STUDIES:  01/11/2015: Hospital Lower extremity arterial duplex Evidence of multilevel arterial occlusive disease bilaterally, left greater than right. Resting ankle-brachial index is mildly depressed on the right at 0.85. The left ABI is moderately depressed at rest at 0.65. On the right side, there likely is a component of aortoiliac inflow disease given monophasic  waveforms throughout.   Assessment & Recommendations:   1. Claudication, intermittent ([place])   2. SOB (shortness of breath)  3. Centrilobular emphysema ([place])  4. Hx of varicose veins of lower extremity  5. H/O tobacco use, presenting hazards to health Quit Dec 2019  6. Essential hypertension EKG 07/15/2018: Marked sinus bradycardia at the rate of 49 bpm, left atrial enlargement, normal axis.  Right bundle branch block.  Normal QT interval.  No evidence of ischemia. 6. Laboratory examination  Recommendation: Patient is here on annual visit and follow-up of peripheral arterial disease, hypertension.  Fortunately she has completely quit smoking since December 2019.  Blood pressure is well controlled.  Dyspnea is remained stable and is related to underlying COPD and emphysema.  She does have varicose veins in her lower extremities but appears to be stable and there is no leg edema.  I discussed the findings of the lower extremity arterial duplex that was performed almost 2 years ago to 3 years ago in the hospital, as symptoms of claudication or remained stable and no significant change in physical exam, no further tests were ordered however I discussed that although her lipids are told to be normal she should be on a statin.  She is now willing to be on atorvastatin 10 mg.  I'll check CMP and lipid profile in 2 months and see her back at that time.  I have congratulated her on having quit smoking. No evidence of critical limb ischemia. Patient states that her labs are within normal limits and is being closely followed by her PCP, renal function is also remained stable. No changes in the medications were done by me today.  Adrian Prows, MD,  [facility] 07/15/2018, 2:04 PM [facility] [place] Pager: [phone] Office: [phone] If no answer Cell [phone]

## 2018-08-01 ENCOUNTER — Other Ambulatory Visit: Payer: Self-pay

## 2018-08-01 DIAGNOSIS — I10 Essential (primary) hypertension: Secondary | ICD-10-CM

## 2018-08-01 MED ORDER — FUROSEMIDE 20 MG PO TABS: 20.0000 mg | ORAL_TABLET | ORAL | 3 refills | Status: DC | PRN

## 2018-08-01 MED ORDER — HYDRALAZINE HCL 25 MG PO TABS: 25.0000 mg | ORAL_TABLET | Freq: Three times a day (TID) | ORAL | 3 refills | Status: DC

## 2018-08-01 MED ORDER — AMLODIPINE BESYLATE 10 MG PO TABS: 10.0000 mg | ORAL_TABLET | Freq: Every day | ORAL | 3 refills | Status: DC

## 2018-08-20 ENCOUNTER — Other Ambulatory Visit: Payer: Self-pay

## 2018-08-20 DIAGNOSIS — I739 Peripheral vascular disease, unspecified: Secondary | ICD-10-CM

## 2018-08-20 MED ORDER — ATORVASTATIN CALCIUM 10 MG PO TABS: 10.0000 mg | ORAL_TABLET | Freq: Every day | ORAL | 1 refills | Status: DC

## 2018-09-03 ENCOUNTER — Other Ambulatory Visit: Payer: Self-pay | Admitting: Cardiology

## 2018-09-04 LAB — COMPREHENSIVE METABOLIC PANEL
ALT: 22 IU/L (ref 0–32)
AST: 27 IU/L (ref 0–40)
Albumin/Globulin Ratio: 2.6 — ABNORMAL HIGH (ref 1.2–2.2)
Albumin: 4.6 g/dL (ref 3.7–4.7)
Alkaline Phosphatase: 96 IU/L (ref 39–117)
BUN/Creatinine Ratio: 25 (ref 12–28)
BUN: 30 mg/dL — ABNORMAL HIGH (ref 8–27)
Bilirubin Total: 0.5 mg/dL (ref 0.0–1.2)
CO2: 24 mmol/L (ref 20–29)
Calcium: 10.8 mg/dL — ABNORMAL HIGH (ref 8.7–10.3)
Chloride: 101 mmol/L (ref 96–106)
Creatinine, Ser: 1.22 mg/dL — ABNORMAL HIGH (ref 0.57–1.00)
GFR calc Af Amer: 51 mL/min/{1.73_m2} — ABNORMAL LOW (ref >59)
GFR calc non Af Amer: 44 mL/min/{1.73_m2} — ABNORMAL LOW (ref >59)
Globulin, Total: 1.8 g/dL (ref 1.5–4.5)
Glucose: 103 mg/dL — ABNORMAL HIGH (ref 65–99)
Potassium: 4.6 mmol/L (ref 3.5–5.2)
Sodium: 139 mmol/L (ref 134–144)
Total Protein: 6.4 g/dL (ref 6.0–8.5)

## 2018-09-04 LAB — LIPID PANEL
Chol/HDL Ratio: 2 ratio (ref 0.0–4.4)
Cholesterol, Total: 151 mg/dL (ref 100–199)
HDL: 76 mg/dL (ref >39)
LDL Calculated: 41 mg/dL (ref 0–99)
Triglycerides: 170 mg/dL — ABNORMAL HIGH (ref 0–149)
VLDL Cholesterol Cal: 34 mg/dL (ref 5–40)

## 2018-09-13 ENCOUNTER — Ambulatory Visit: Payer: Medicare Other | Admitting: Cardiology

## 2018-09-27 ENCOUNTER — Telehealth: Payer: Self-pay

## 2018-09-27 NOTE — Telephone Encounter (Signed)
Generally not a side effect of lipitor. Is it only with one leg? She may need to be seen as she has PAD and could be related to that.

## 2018-09-27 NOTE — Telephone Encounter (Signed)
She has a appt on 5/14 she will cont lipitor

## 2018-09-27 NOTE — Telephone Encounter (Signed)
Pt called c/o atorvastatin is giving her leg swelling with redness; I advised her not to take it until she heard back from me

## 2018-10-03 ENCOUNTER — Ambulatory Visit (INDEPENDENT_AMBULATORY_CARE_PROVIDER_SITE_OTHER): Payer: Medicare Other | Admitting: Cardiology

## 2018-10-03 ENCOUNTER — Encounter: Payer: Self-pay | Admitting: Cardiology

## 2018-10-03 ENCOUNTER — Other Ambulatory Visit: Payer: Self-pay

## 2018-10-03 VITALS — BP 118/74 | HR 57 | Ht 63.0 in | Wt 120.0 lb

## 2018-10-03 DIAGNOSIS — R600 Localized edema: Secondary | ICD-10-CM | POA: Diagnosis not present

## 2018-10-03 DIAGNOSIS — I739 Peripheral vascular disease, unspecified: Secondary | ICD-10-CM

## 2018-10-03 DIAGNOSIS — Z8679 Personal history of other diseases of the circulatory system: Secondary | ICD-10-CM | POA: Diagnosis not present

## 2018-10-03 DIAGNOSIS — Z87891 Personal history of nicotine dependence: Secondary | ICD-10-CM

## 2018-10-03 DIAGNOSIS — R0602 Shortness of breath: Secondary | ICD-10-CM

## 2018-10-03 DIAGNOSIS — I10 Essential (primary) hypertension: Secondary | ICD-10-CM

## 2018-10-03 NOTE — Progress Notes (Signed)
Primary Physician/Referring:  Antony Contras, MD  Patient ID: [name], female    DOB: 02-18-1946, 73 y.o.   MRN: [id number]  Chief Complaint  Patient presents with   PAD   Hyperlipidemia   Hypertension   Follow-up   Leg Swelling    HPI: [name]  is a 73 y.o. female  with peripheral artery disease in the right lower leg at the level of tibial vessels and digital vessels is also suspected. On the left side, there is more significant depression of the ankle-brachial index at rest. There likely is a component of aortoiliac inflow disease as well as distal SFA occlusive disease.  Past medical history significant for hypertension, COPD,Stage III chronic kidney disease, history of breast cancer. She has had bilateral venous insufficiency and has had ablation in the past with resolution of leg edema.  She quit smoking in December 2019.   She was started on Lipitor 2 months ago, and states that since she started the medication, she has had bilateral leg swelling, R>L, and redness. Symptoms are worse at the end of the day and improve with leg elevation. Denies any cramping or worsening claudication symptoms. Has mild claudication symptoms. No pain in her legs. Denies any chest pain or shortness of breath.   Past Medical History:  Diagnosis Date   Acute renal failure superimposed on stage 3 chronic kidney disease ([place]) 10/27/2014   Back pain, chronic    Breast cancer ([place]) 09/09/10   Cancer of colon ([place]) 05/24/2011   Centrilobular emphysema ([place]) 07/15/2018   CKD (chronic kidney disease), stage III (HCC)    Claudication, intermittent ([place]) 07/15/2018   Depression    GIB (gastrointestinal bleeding)    H/O tobacco use, presenting hazards to health Quit Dec 2019 05/03/2011   50 years, up to 3PPD quit last year.    Hx of varicose veins of lower extremity 07/15/2018   Hypertension    SDH (subdural hematoma) (HCC)    SOB (shortness of breath) 07/15/2018    Past  Surgical History:  Procedure Laterality Date   BACK SURGERY     BREAST LUMPECTOMY  10/10/2010   Rt Breast   COLON SURGERY  04/2011   COLONOSCOPY  05/03/2011   Procedure: COLONOSCOPY;  Surgeon: Jeryl Columbia, MD;  Location: [facility] END[facility]COPY;  Service: Endoscopy;  Laterality: N/A;   JOINT REPLACEMENT     R&L total shoulder replacements   LUMBAR DISC SURGERY     PARTIAL HYSTERECTOMY     SHOULDER SURGERY     TUMOR REMOVAL  Tumor removed R breast    Social History   Socioeconomic History   Marital status: Divorced    Spouse name: Not on file   Number of children: 2   Years of education: Not on file   Highest education level: Not on file  Occupational History   Not on file  Social Needs   Financial resource strain: Not on file   Food insecurity:    Worry: Not on file    Inability: Not on file   Transportation needs:    Medical: Not on file    Non-medical: Not on file  Tobacco Use   Smoking status: Former Smoker    Packs/day: 0.75    Years: 30.00    Pack years: 22.50    Types: Cigarettes    Last attempt to quit: 04/2018    Years since quitting: 0.4   Smokeless tobacco: Former Systems developer    Quit date: 04/22/2010  Substance and Sexual Activity   Alcohol use: Yes    Comment: rarely    Drug use: No   Sexual activity: Not Currently  Lifestyle   Physical activity:    Days per week: Not on file    Minutes per session: Not on file   Stress: Not on file  Relationships   Social connections:    Talks on phone: Not on file    Gets together: Not on file    Attends religious service: Not on file    Active member of club or organization: Not on file    Attends meetings of clubs or organizations: Not on file    Relationship status: Not on file   Intimate partner violence:    Fear of current or ex partner: Not on file    Emotionally abused: Not on file    Physically abused: Not on file    Forced sexual activity: Not on file  Other Topics Concern   Not on  file  Social History Narrative   Lives alone in a one story home.  Has 2 children.     On disability for low back pain since the age of 40.     Education: high school.    Review of Systems  Constitution: Negative for decreased appetite, malaise/fatigue, weight gain and weight loss.  Eyes: Negative for visual disturbance.  Cardiovascular: Positive for claudication and leg swelling. Negative for chest pain, dyspnea on exertion, orthopnea, palpitations and syncope.  Respiratory: Negative for hemoptysis and wheezing.   Endocrine: Negative for cold intolerance and heat intolerance.  Hematologic/Lymphatic: Does not bruise/bleed easily.  Skin: Negative for nail changes.  Musculoskeletal: Negative for muscle weakness and myalgias.  Gastrointestinal: Negative for abdominal pain, change in bowel habit, nausea and vomiting.  Neurological: Negative for difficulty with concentration, dizziness, focal weakness and headaches.  Psychiatric/Behavioral: Negative for altered mental status and suicidal ideas.  All other systems reviewed and are negative.     Objective  Blood pressure 118/74, pulse (!) 57, height 5\' 3"  (1.6 m), weight 120 lb (54.4 kg), SpO2 97 %. Body mass index is 21.26 kg/m.    Physical Exam  Constitutional: She is oriented to person, place, and time. Vital signs are normal. She appears well-developed and well-nourished.  HENT:  Head: Normocephalic and atraumatic.  Neck: Normal range of motion.  Cardiovascular: Normal rate, regular rhythm, normal heart sounds and intact distal pulses.  Pulses:      Femoral pulses are 2+ on the right side.      Popliteal pulses are 1+ on the right side and 1+ on the left side.       Dorsalis pedis pulses are 2+ on the right side and 2+ on the left side.       Posterior tibial pulses are 2+ on the right side and 2+ on the left side.  Erythema bilateral R>L, 2+ pitting edema  Pulmonary/Chest: Effort normal and breath sounds normal. No accessory  muscle usage. No respiratory distress.  Abdominal: Soft. Bowel sounds are normal.  Musculoskeletal: Normal range of motion.  Neurological: She is alert and oriented to person, place, and time.  Skin: Skin is warm and dry. There is erythema.  Vitals reviewed.  Radiology: No results found.  Laboratory examination:    CMP Latest Ref Rng & Units 09/03/2018 01/25/2016 09/21/2015  Glucose 65 - 99 mg/dL 103(H) 153(H) 103  BUN 8 - 27 mg/dL 30(H) 44.8(H) 22.5  Creatinine 0.57 - 1.00 mg/dL 1.22(H) 1.6(H) 1.2(H)  Sodium 134 - 144 mmol/L 139 140 141  Potassium 3.5 - 5.2 mmol/L 4.6 3.8 3.8  Chloride 96 - 106 mmol/L 101 - -  CO2 20 - 29 mmol/L 24 23 26   Calcium 8.7 - 10.3 mg/dL 10.8(H) 9.8 9.8  Total Protein 6.0 - 8.5 g/dL 6.4 6.2(L) 6.6  Total Bilirubin 0.0 - 1.2 mg/dL 0.5 0.49 0.56  Alkaline Phos 39 - 117 IU/L 96 79 88  AST 0 - 40 IU/L 27 23 22   ALT 0 - 32 IU/L 22 15 18    CBC Latest Ref Rng & Units 02/22/2016 01/25/2016 10/25/2015  WBC 3.9 - 10.3 10e3/uL 5.5 5.2 5.3  Hemoglobin 11.6 - 15.9 g/dL 13.3 13.5 13.3  Hematocrit 34.8 - 46.6 % 39.0 39.2 39.7  Platelets 145 - 400 10e3/uL 203 226 227   Lipid Panel     Component Value Date/Time   CHOL 151 09/03/2018 1426   TRIG 170 (H) 09/03/2018 1426   HDL 76 09/03/2018 1426   CHOLHDL 2.0 09/03/2018 1426   LDLCALC 41 09/03/2018 1426   HEMOGLOBIN A1C Lab Results  Component Value Date   HGBA1C 6.1 (H) 10/28/2014   MPG 128 10/28/2014   TSH No results for input(s): TSH in the last 8760 hours.  PRN Meds:. Medications Discontinued During This Encounter  Medication Reason   metoprolol tartrate (LOPRESSOR) 25 mg/10 mL SUSP Error   prednisoLONE acetate (PRED FORTE) 1 % ophthalmic suspension Error   PROLENSA 0.07 % SOLN Error   RESTASIS MULTIDOSE 0.05 % ophthalmic emulsion Error   trimethoprim-polymyxin b (POLYTRIM) ophthalmic solution Error   valsartan-hydrochlorothiazide (DIOVAN-HCT) 320-25 MG tablet Error   Current Meds  Medication Sig     ALPRAZolam (XANAX) 1 MG tablet as needed.    amLODipine (NORVASC) 10 MG tablet Take 1 tablet (10 mg total) by mouth daily. Pt takes at night.   aspirin EC 81 MG tablet Take 81 mg by mouth daily.   atorvastatin (LIPITOR) 10 MG tablet Take 1 tablet (10 mg total) by mouth daily.   buPROPion (WELLBUTRIN XL) 150 MG 24 hr tablet Take 150 mg by mouth daily.   docusate sodium (COLACE) 100 MG capsule Take 200 mg by mouth daily as needed.    furosemide (LASIX) 20 MG tablet Take 1 tablet (20 mg total) by mouth as needed.   hydrALAZINE (APRESOLINE) 25 MG tablet Take 1 tablet (25 mg total) by mouth 3 (three) times daily.   losartan-hydrochlorothiazide (HYZAAR) 100-12.5 MG tablet Take 1 tablet by mouth daily.   metoprolol tartrate (LOPRESSOR) 25 MG tablet Take 25 mg by mouth 2 (two) times daily.    mirtazapine (REMERON) 30 MG tablet daily. 1/2-1   oxyCODONE (OXYCONTIN) 60 MG 12 hr tablet Take 1 tablet by mouth every 8 (eight) hours.   rOPINIRole (REQUIP) 0.5 MG tablet Take 0.5 mg by mouth at bedtime as needed ("restlessness").     Cardiac Studies:    01/11/2015: Hospital Lower extremity arterial duplex Evidence of multilevel arterial occlusive disease bilaterally, left greater than right. Resting ankle-brachial index is mildly depressed on the right at 0.85. The left ABI is moderately depressed at rest at 0.65. On the right side, there likely is a component of aortoiliac inflow disease given monophasic waveforms throughout.  Echocardiogram  08/23/2014: LV size was normal. Wall thickness was normal. LVEF 55% to 60%. No regional wall motion abnormalities. Grade 1 diastolic dysfunction. Mildly thickened mitral leaflets. Mild MR. Atrial septal defect cannot be excluded by color doppler. Mild TR. PA peak pressure:  36 mmHg. IVC dilated. The respirophasic diameter changes were blunted (< 50%), consistent with elevated central venous pressure.   Assessment   Claudication, intermittent (HCC) -  Plan: PCV LOWER ARTERIAL (BILATERAL)  Bilateral leg edema  Hx of varicose veins of lower extremity  SOB (shortness of breath)  Essential hypertension  H/O tobacco use, presenting hazards to health  EKG 07/15/2018: Marked sinus bradycardia at the rate of 49 bpm, left atrial enlargement, normal axis.  Right bundle branch block.  Normal QT interval.  No evidence of ischemia.  Recommendations:   Patient was started on Lipitor 2 months ago and recent lipid panel shows good lipid control except for mild triglyceride elevation. CMP and CBC are stable. Since being on the medication, patient has noticed bilateral leg swelling and redness. She does have varicose veins on exam and suspect that her symptoms are related to venous insufficiency. I do not feel likely related to statin; however, asked her to hold for one week to see if symptoms improve. I have also encouraged her to wear support stockings and elevate her legs while sitting. As it has been since 2016 since she has had evaluation of her legs, will obtain lower extremity duplex for surveillance. No evidence of critical limb ischemia. Vascular exam is slightly changed; however, symptoms are unchanged.   She has mild dyspnea on exertion related to COPD and emphysema from former tobacco use that is stable. Blood pressure is well controlled. I have encouraged her to follow low sodium diet and to also limit fatty foods from her diet. I would like to see her back in 2-3 weeks for follow up on leg swelling.   Miquel Dunn, MSN, APRN, FNP-C [facility]. [place] Office: [phone] Fax: [phone]

## 2018-10-08 ENCOUNTER — Other Ambulatory Visit: Payer: Self-pay

## 2018-10-08 DIAGNOSIS — I10 Essential (primary) hypertension: Secondary | ICD-10-CM

## 2018-10-08 MED ORDER — METOPROLOL TARTRATE 25 MG PO TABS: 25.0000 mg | ORAL_TABLET | Freq: Two times a day (BID) | ORAL | 3 refills | Status: DC

## 2018-10-11 ENCOUNTER — Other Ambulatory Visit: Payer: Self-pay | Admitting: Cardiology

## 2018-10-18 ENCOUNTER — Other Ambulatory Visit: Payer: Self-pay

## 2018-10-18 ENCOUNTER — Telehealth: Payer: Self-pay | Admitting: Cardiology

## 2018-10-18 ENCOUNTER — Ambulatory Visit (INDEPENDENT_AMBULATORY_CARE_PROVIDER_SITE_OTHER): Payer: Medicare Other | Admitting: Cardiology

## 2018-10-18 ENCOUNTER — Ambulatory Visit: Payer: Medicare Other | Admitting: Cardiology

## 2018-10-18 ENCOUNTER — Encounter: Payer: Self-pay | Admitting: Cardiology

## 2018-10-18 VITALS — BP 138/73 | HR 66 | Ht 60.0 in | Wt 121.4 lb

## 2018-10-18 DIAGNOSIS — J432 Centrilobular emphysema: Secondary | ICD-10-CM | POA: Diagnosis not present

## 2018-10-18 DIAGNOSIS — I872 Venous insufficiency (chronic) (peripheral): Secondary | ICD-10-CM

## 2018-10-18 DIAGNOSIS — N183 Chronic kidney disease, stage 3 unspecified: Secondary | ICD-10-CM

## 2018-10-18 DIAGNOSIS — R0609 Other forms of dyspnea: Secondary | ICD-10-CM | POA: Diagnosis not present

## 2018-10-18 DIAGNOSIS — I739 Peripheral vascular disease, unspecified: Secondary | ICD-10-CM

## 2018-10-18 DIAGNOSIS — R0600 Dyspnea, unspecified: Secondary | ICD-10-CM

## 2018-10-18 MED ORDER — ATORVASTATIN CALCIUM 10 MG PO TABS: 10.0000 mg | ORAL_TABLET | Freq: Every day | ORAL | 1 refills | Status: DC

## 2018-10-18 MED ORDER — AMLODIPINE BESYLATE 5 MG PO TABS: 5.0000 mg | ORAL_TABLET | Freq: Every day | ORAL | 3 refills | Status: DC

## 2018-10-18 MED ORDER — FUROSEMIDE 20 MG PO TABS: 20.0000 mg | ORAL_TABLET | Freq: Every day | ORAL | 3 refills | Status: DC

## 2018-10-18 MED ORDER — POTASSIUM CHLORIDE ER 10 MEQ PO TBCR: 10.0000 meq | EXTENDED_RELEASE_TABLET | Freq: Every day | ORAL | 1 refills | Status: DC

## 2018-10-18 MED ORDER — AMOXICILLIN-POT CLAVULANATE 500-125 MG PO TABS: 1.0000 | ORAL_TABLET | Freq: Two times a day (BID) | ORAL | 0 refills | Status: DC

## 2018-10-18 NOTE — Progress Notes (Signed)
Primary Physician/Referring:  Antony Contras, MD  Patient ID: Holly S Drummer, female    DOB: 09-22-1945, 73 y.o.   MRN: [id number]  Chief Complaint  Patient presents with   Leg Swelling   Hypertension   Follow-up    HPI: [name]  is a 73 y.o. female  with peripheral artery disease in the right lower leg at the level of tibial vessels and digital vessels is also suspected. On the left side, there is more significant depression of the ankle-brachial index at rest. There likely is a component of aortoiliac inflow disease as well as distal SFA occlusive disease.  Past medical history significant for hypertension, COPD,Stage III chronic kidney disease, history of breast cancer. She has had bilateral venous insufficiency and has had ablation in the past with resolution of leg edema.  She quit smoking in December 2019.   She was seen 2-3 weeks ago and had newly noted leg swelling that was felt to likely be related to venous insufficiency; however, her symptoms did not start until after starting Lipitor. Although I did not feel was side effect, as she was convinced it was related to medication, I advised to hold Lipitor to see if symptoms would improve and to also use support stockings. She now presents for follow up. Lower extremity duplex was ordered for surveillance of PAD, but has not yet been performed.  She has noticed some improvement in leg swelling with use of support stockings and keeping her legs elevated; however, not back at her baseline. She continues to have some swelling, redness, and pain to bilateral legs. She has previously seen Vascular for venous insufficiency, but has been several years. No ulcerations. She has had bilateral leg swelling, R>L, and redness. Symptoms are worse at the end of the day and improve with leg elevation. Denies any cramping or worsening claudication symptoms. Has mild claudication symptoms. She has been taking Lasix every other day. She  previously had denied any shortness of breath, but today she does mention shortness of breath with walking up stairs or with over exertion. No chest pain.   Past Medical History:  Diagnosis Date   Acute renal failure superimposed on stage 3 chronic kidney disease ([place]) 10/27/2014   Back pain, chronic    Breast cancer ([place]) 09/09/10   Cancer of colon ([place]) 05/24/2011   Centrilobular emphysema ([place]) 07/15/2018   CKD (chronic kidney disease), stage III (H[facility])    Claudication, intermittent ([place]) 07/15/2018   Depression    GIB (gastrointestinal bleeding)    H/O tobacco use, presenting hazards to health Quit Dec 2019 05/03/2011   50 years, up to 3PPD quit last year.    Hx of varicose veins of lower extremity 07/15/2018   Hypertension    SDH (subdural hematoma) (HCC)    SOB (shortness of breath) 07/15/2018    Past Surgical History:  Procedure Laterality Date   BACK SURGERY     BREAST LUMPECTOMY  10/10/2010   Rt Breast   COLON SURGERY  04/2011   COLONOSCOPY  05/03/2011   Procedure: COLONOSCOPY;  Surgeon: Jeryl Columbia, MD;  Location: [facility] ENDOSCOPY;  Service: Endoscopy;  Laterality: N/A;   JOINT REPLACEMENT     R&L total shoulder replacements   LUMBAR DISC SURGERY     PARTIAL HYSTERECTOMY     SHOULDER SURGERY     TUMOR REMOVAL  Tumor removed R breast    Social History   Socioeconomic History   Marital status: Divorced    Spouse  name: Not on file   Number of children: 2   Years of education: Not on file   Highest education level: Not on file  Occupational History   Not on file  Social Needs   Financial resource strain: Not on file   Food insecurity:    Worry: Not on file    Inability: Not on file   Transportation needs:    Medical: Not on file    Non-medical: Not on file  Tobacco Use   Smoking status: Former Smoker    Packs/day: 0.75    Years: 30.00    Pack years: 22.50    Types: Cigarettes    Last attempt to quit: 04/2018    Years since  quitting: 0.4   Smokeless tobacco: Former Systems developer    Quit date: 04/22/2010  Substance and Sexual Activity   Alcohol use: Yes    Comment: rarely    Drug use: No   Sexual activity: Not Currently  Lifestyle   Physical activity:    Days per week: Not on file    Minutes per session: Not on file   Stress: Not on file  Relationships   Social connections:    Talks on phone: Not on file    Gets together: Not on file    Attends religious service: Not on file    Active member of club or organization: Not on file    Attends meetings of clubs or organizations: Not on file    Relationship status: Not on file   Intimate partner violence:    Fear of current or ex partner: Not on file    Emotionally abused: Not on file    Physically abused: Not on file    Forced sexual activity: Not on file  Other Topics Concern   Not on file  Social History Narrative   Lives alone in a one story home.  Has 2 children.     On disability for low back pain since the age of 40.     Education: high school.    Review of Systems  Constitution: Negative for decreased appetite, malaise/fatigue, weight gain and weight loss.  Eyes: Negative for visual disturbance.  Cardiovascular: Positive for claudication and leg swelling. Negative for chest pain, dyspnea on exertion, orthopnea, palpitations and syncope.  Respiratory: Negative for hemoptysis and wheezing.   Endocrine: Negative for cold intolerance and heat intolerance.  Hematologic/Lymphatic: Does not bruise/bleed easily.  Skin: Negative for nail changes.  Musculoskeletal: Negative for muscle weakness and myalgias.  Gastrointestinal: Negative for abdominal pain, change in bowel habit, nausea and vomiting.  Neurological: Negative for difficulty with concentration, dizziness, focal weakness and headaches.  Psychiatric/Behavioral: Negative for altered mental status and suicidal ideas.  All other systems reviewed and are negative.     Objective  Blood  pressure 138/73, pulse 66, height 5' (1.524 m), weight 121 lb 6.4 oz (55.1 kg), SpO2 97 %. Body mass index is 23.71 kg/m.    Physical Exam  Constitutional: She is oriented to person, place, and time. Vital signs are normal. She appears well-developed and well-nourished.  HENT:  Head: Normocephalic and atraumatic.  Neck: Normal range of motion.  Cardiovascular: Normal rate, regular rhythm, normal heart sounds and intact distal pulses.  Pulses:      Femoral pulses are 2+ on the right side.      Popliteal pulses are 1+ on the right side and 1+ on the left side.       Dorsalis pedis pulses are 2+  on the right side and 2+ on the left side.       Posterior tibial pulses are 2+ on the right side and 2+ on the left side.  Erythema bilateral R>L, 1+ pitting edema  Pulmonary/Chest: Effort normal and breath sounds normal. No accessory muscle usage. No respiratory distress.  Abdominal: Soft. Bowel sounds are normal.  Musculoskeletal: Normal range of motion.  Neurological: She is alert and oriented to person, place, and time.  Skin: Skin is warm and dry. There is erythema.  Vitals reviewed.  Radiology: No results found.  Laboratory examination:    CMP Latest Ref Rng & Units 09/03/2018 01/25/2016 09/21/2015  Glucose 65 - 99 mg/dL 103(H) 153(H) 103  BUN 8 - 27 mg/dL 30(H) 44.8(H) 22.5  Creatinine 0.57 - 1.00 mg/dL 1.22(H) 1.6(H) 1.2(H)  Sodium 134 - 144 mmol/L 139 140 141  Potassium 3.5 - 5.2 mmol/L 4.6 3.8 3.8  Chloride 96 - 106 mmol/L 101 - -  CO2 20 - 29 mmol/L 24 23 26   Calcium 8.7 - 10.3 mg/dL 10.8(H) 9.8 9.8  Total Protein 6.0 - 8.5 g/dL 6.4 6.2(L) 6.6  Total Bilirubin 0.0 - 1.2 mg/dL 0.5 0.49 0.56  Alkaline Phos 39 - 117 IU/L 96 79 88  AST 0 - 40 IU/L 27 23 22   ALT 0 - 32 IU/L 22 15 18    CBC Latest Ref Rng & Units 02/22/2016 01/25/2016 10/25/2015  WBC 3.9 - 10.3 10e3/uL 5.5 5.2 5.3  Hemoglobin 11.6 - 15.9 g/dL 13.3 13.5 13.3  Hematocrit 34.8 - 46.6 % 39.0 39.2 39.7  Platelets 145 - 400  10e3/uL 203 226 227   Lipid Panel     Component Value Date/Time   CHOL 151 09/03/2018 1426   TRIG 170 (H) 09/03/2018 1426   HDL 76 09/03/2018 1426   CHOLHDL 2.0 09/03/2018 1426   LDLCALC 41 09/03/2018 1426   HEMOGLOBIN A1C Lab Results  Component Value Date   HGBA1C 6.1 (H) 10/28/2014   MPG 128 10/28/2014   TSH No results for input(s): TSH in the last 8760 hours.  PRN Meds:. Medications Discontinued During This Encounter  Medication Reason   amLODipine (NORVASC) 10 MG tablet Discontinued by provider   furosemide (LASIX) 20 MG tablet Discontinued by provider   atorvastatin (LIPITOR) 10 MG tablet Reorder   Current Meds  Medication Sig   ALPRAZolam (XANAX) 1 MG tablet as needed.    anastrozole (ARIMIDEX) 1 MG tablet TAKE ONE (1) TABLET BY MOUTH AT BEDTIME   aspirin EC 81 MG tablet Take 81 mg by mouth daily.   buPROPion (WELLBUTRIN XL) 150 MG 24 hr tablet Take 150 mg by mouth daily.   docusate sodium (COLACE) 100 MG capsule Take 200 mg by mouth daily as needed.    hydrALAZINE (APRESOLINE) 25 MG tablet Take 1 tablet (25 mg total) by mouth 3 (three) times daily.   losartan-hydrochlorothiazide (HYZAAR) 100-12.5 MG tablet Take 1 tablet by mouth daily.   metoprolol tartrate (LOPRESSOR) 25 MG tablet Take 1 tablet (25 mg total) by mouth 2 (two) times daily.   mirtazapine (REMERON) 30 MG tablet daily. 1/2-1   oxyCODONE (OXYCONTIN) 60 MG 12 hr tablet Take 1 tablet by mouth every 8 (eight) hours.   rOPINIRole (REQUIP) 0.5 MG tablet Take 0.5 mg by mouth at bedtime as needed ("restlessness").    [DISCONTINUED] amLODipine (NORVASC) 10 MG tablet Take 1 tablet (10 mg total) by mouth daily. Pt takes at night.   [DISCONTINUED] furosemide (LASIX) 20 MG tablet Take 1  tablet (20 mg total) by mouth as needed. (Patient taking differently: Take 20 mg by mouth. Every other day)    Cardiac Studies:    01/11/2015: Hospital Lower extremity arterial duplex Evidence of multilevel  arterial occlusive disease bilaterally, left greater than right. Resting ankle-brachial index is mildly depressed on the right at 0.85. The left ABI is moderately depressed at rest at 0.65. On the right side, there likely is a component of aortoiliac inflow disease given monophasic waveforms throughout.  Echocardiogram  08/23/2014: LV size was normal. Wall thickness was normal. LVEF 55% to 60%. No regional wall motion abnormalities. Grade 1 diastolic dysfunction. Mildly thickened mitral leaflets. Mild MR. Atrial septal defect cannot be excluded by color doppler. Mild TR. PA peak pressure: 36 mmHg. IVC dilated. The respirophasic diameter changes were blunted (< 50%), consistent with elevated central venous pressure.   Assessment   Venous insufficiency of both lower extremities - Plan: Ambulatory referral to Interventional Radiology, CANCELED: Ambulatory referral to Interventional Radiology  Dyspnea on exertion - Plan: PCV ECHOCARDIOGRAM COMPLETE, Basic Metabolic Panel (BMET), B Nat Peptide  PAD (peripheral artery disease) ([place]) - Plan: atorvastatin (LIPITOR) 10 MG tablet  Centrilobular emphysema ([place])  Chronic kidney disease, stage 3 ([place])  EKG 07/15/2018: Marked sinus bradycardia at the rate of 49 bpm, left atrial enlargement, normal axis.  Right bundle branch block.  Normal QT interval.  No evidence of ischemia.  Recommendations:   Patient has had improvement in leg swelling with use of support stockings and leg elevation; however, continues to have 1+ pitting edema, erythema, and pain.  She appears to be possibly developing some cellulitis to the right leg. Continue to feel her leg edema is likely related to venous insufficiency, especially in view of her previous history of this.  She also is on amlodipine 10 mg that may also be contributing, will decrease to 5 mg.  She has been taking Lasix every other day, will have her increase to twice a day for the next 2 days, then will continue with  once a day dose.  Reports previously has had history of low potassium level; however, on last labs performed in April potassium was normal, but was also not using Lasix regularly at that time.  I worsened her in low-dose potassium 10 mEq daily to be taken with Lasix.  Will check BMP along with BNP.  Has chronic kidney disease stage III, and will need close monitoring.  I have sent her in a prescription for Augmentin to use for 10 days twice a day in view of her cellulitis.  I have encouraged her to use support stockings first thing in the morning every day.  She will be measured for support stockings today, but wishes to obtain them from a different location due to cost. Also encouraged leg elevation. Will place referral to interventional radiology for venous insufficiency study for further evaluation.  She has normal DP and PT pulses on exam, no ischemic changes are noted.  She will need lower extremity arterial duplex at some point, but will hold off for now.  Has mild, minimal symptoms of claudication.  I have encouraged her to restart Lipitor as her symptoms are not related to this.  She does mention today shortness of breath with climbing stairs or overexertion, no chest pain.  Previous echocardiogram had shown moderate pulmonary hypertension.  Will repeat echocardiogram for further evaluation.  She also has emphysema by CT scan performed in 2016 that could also be contributing to her shortness of  breath.  I would like to see her back in 10 days for close monitoring and for follow-up.   *I have discussed this case with Dr. Einar Gip and he personally examined the patient and participated in formulating the plan.*    Miquel Dunn, MSN, APRN, FNP-C [facility]. [place] Office: [phone] Fax: [phone]

## 2018-10-21 ENCOUNTER — Other Ambulatory Visit: Payer: Self-pay

## 2018-10-21 ENCOUNTER — Telehealth: Payer: Self-pay

## 2018-10-21 DIAGNOSIS — I10 Essential (primary) hypertension: Secondary | ICD-10-CM

## 2018-10-21 MED ORDER — AMLODIPINE BESYLATE 5 MG PO TABS: 5.0000 mg | ORAL_TABLET | Freq: Every day | ORAL | 3 refills | Status: DC

## 2018-10-21 NOTE — Telephone Encounter (Signed)
Pt called stating the amlodipine you told her to cut in half is so small that when she does it crumbles; She wants to know is there a certain time she should take the furosemide & lasix; Also same with antibiotic?

## 2018-10-22 NOTE — Telephone Encounter (Signed)
Again discussed plan of care with patient. She will have labs performed in 2 weeks from her office visit.

## 2018-10-28 ENCOUNTER — Other Ambulatory Visit: Payer: Self-pay

## 2018-10-28 ENCOUNTER — Ambulatory Visit (INDEPENDENT_AMBULATORY_CARE_PROVIDER_SITE_OTHER): Payer: Medicare Other | Admitting: Cardiology

## 2018-10-28 ENCOUNTER — Encounter: Payer: Self-pay | Admitting: Cardiology

## 2018-10-28 VITALS — BP 108/57 | HR 54 | Temp 97.1°F | Ht 60.0 in | Wt 118.7 lb

## 2018-10-28 DIAGNOSIS — I872 Venous insufficiency (chronic) (peripheral): Secondary | ICD-10-CM | POA: Diagnosis not present

## 2018-10-28 DIAGNOSIS — I739 Peripheral vascular disease, unspecified: Secondary | ICD-10-CM

## 2018-10-28 DIAGNOSIS — R0600 Dyspnea, unspecified: Secondary | ICD-10-CM

## 2018-10-28 DIAGNOSIS — R0609 Other forms of dyspnea: Secondary | ICD-10-CM | POA: Diagnosis not present

## 2018-10-28 DIAGNOSIS — I129 Hypertensive chronic kidney disease with stage 1 through stage 4 chronic kidney disease, or unspecified chronic kidney disease: Secondary | ICD-10-CM

## 2018-10-28 DIAGNOSIS — N183 Chronic kidney disease, stage 3 unspecified: Secondary | ICD-10-CM

## 2018-10-28 NOTE — Progress Notes (Signed)
Primary Physician/Referring:  Maude Leriche, PA-C  Patient ID: Holly S Gatliff, female    DOB: 1946-01-24, 73 y.o.   MRN: [id number]  Chief Complaint  Patient presents with   Edema   Follow-up    10day    HPI: [name]  is a 73 y.o. female  with peripheral artery disease in the right lower leg at the level of tibial vessels and digital vessels is also suspected. On the left side, there is more significant depression of the ankle-brachial index at rest. There likely is a component of aortoiliac inflow disease as well as distal SFA occlusive disease.  Past medical history significant for hypertension, COPD,Stage III chronic kidney disease, history of breast cancer. She has had bilateral venous insufficiency and has had ablation in the past with resolution of leg edema.  She quit smoking in December 2019.   Last seen 10 days ago with significant bilateral leg edema and erythema. Symptoms felt to be related to venous insufficiency. She was encouraged to increase lasix to twice a day for a few days. She was also started on antibiotic therapy for cellulitis. She has since started wearing support stockings daily. She has noticed significant improvement in redness and leg swelling with this. Does continue to have mild leg swelling and mild erythema on occasion. No ulcerations. She has previously seen Vascular for venous insufficiency, and referral was placed back to IR for venous insufficiency study, but has not been contacted for appt yet. Denies any cramping or worsening claudication symptoms. Has mild claudication symptoms. She does continue to have shortness of breath, particularly with climbing stairs. Also has episodes of shortness of breath that she attributes to anxiety. No chest pain. She has not had labs performed.   Past Medical History:  Diagnosis Date   Acute renal failure superimposed on stage 3 chronic kidney disease ([place]) 10/27/2014   Back pain, chronic    Breast  cancer ([place]) 09/09/10   Cancer of colon ([place]) 05/24/2011   Centrilobular emphysema ([place]) 07/15/2018   CKD (chronic kidney disease), stage III ([facility])    Claudication, intermittent ([place]) 07/15/2018   Depression    GIB (gastrointestinal bleeding)    H/O tobacco use, presenting hazards to health Quit Dec 2019 05/03/2011   50 years, up to 3PPD quit last year.    Hx of varicose veins of lower extremity 07/15/2018   Hypertension    SDH (subdural hematoma) (HCC)    SOB (shortness of breath) 07/15/2018    Past Surgical History:  Procedure Laterality Date   BACK SURGERY     BREAST LUMPECTOMY  10/10/2010   Rt Breast   COLON SURGERY  04/2011   COLONOSCOPY  05/03/2011   Procedure: COLONOSCOPY;  Surgeon: Jeryl Columbia, MD;  Location: [facility] ENDOSCOPY;  Service: Endoscopy;  Laterality: N/A;   JOINT REPLACEMENT     R&L total shoulder replacements   LUMBAR DISC SURGERY     PARTIAL HYSTERECTOMY     SHOULDER SURGERY     TUMOR REMOVAL  Tumor removed R breast    Social History   Socioeconomic History   Marital status: Divorced    Spouse name: Not on file   Number of children: 2   Years of education: Not on file   Highest education level: Not on file  Occupational History   Not on file  Social Needs   Financial resource strain: Not on file   Food insecurity:    Worry: Not on file    Inability:  Not on file   Transportation needs:    Medical: Not on file    Non-medical: Not on file  Tobacco Use   Smoking status: Former Smoker    Packs/day: 0.75    Years: 30.00    Pack years: 22.50    Types: Cigarettes    Last attempt to quit: 04/2018    Years since quitting: 0.5   Smokeless tobacco: Former Systems developer    Quit date: 04/22/2010  Substance and Sexual Activity   Alcohol use: Yes    Comment: rarely    Drug use: No   Sexual activity: Not Currently  Lifestyle   Physical activity:    Days per week: Not on file    Minutes per session: Not on file   Stress: Not on file   Relationships   Social connections:    Talks on phone: Not on file    Gets together: Not on file    Attends religious service: Not on file    Active member of club or organization: Not on file    Attends meetings of clubs or organizations: Not on file    Relationship status: Not on file   Intimate partner violence:    Fear of current or ex partner: Not on file    Emotionally abused: Not on file    Physically abused: Not on file    Forced sexual activity: Not on file  Other Topics Concern   Not on file  Social History Narrative   Lives alone in a one story home.  Has 2 children.     On disability for low back pain since the age of 54.     Education: high school.    Review of Systems  Constitution: Negative for decreased appetite, malaise/fatigue, weight gain and weight loss.  Eyes: Negative for visual disturbance.  Cardiovascular: Positive for claudication, dyspnea on exertion and leg swelling. Negative for chest pain, orthopnea, palpitations and syncope.  Respiratory: Negative for hemoptysis and wheezing.   Endocrine: Negative for cold intolerance and heat intolerance.  Hematologic/Lymphatic: Does not bruise/bleed easily.  Skin: Negative for nail changes.  Musculoskeletal: Negative for muscle weakness and myalgias.  Gastrointestinal: Negative for abdominal pain, change in bowel habit, nausea and vomiting.  Neurological: Negative for difficulty with concentration, dizziness, focal weakness and headaches.  Psychiatric/Behavioral: Negative for altered mental status and suicidal ideas.  All other systems reviewed and are negative.     Objective  Blood pressure (!) 108/57, pulse (!) 54, temperature (!) 97.1 F (36.2 C), height 5' (1.524 m), weight 118 lb 11.2 oz (53.8 kg), SpO2 92 %. Body mass index is 23.18 kg/m.    Physical Exam  Constitutional: She is oriented to person, place, and time. Vital signs are normal. She appears well-developed and well-nourished.  HENT:    Head: Normocephalic and atraumatic.  Neck: Normal range of motion.  Cardiovascular: Normal rate, regular rhythm, normal heart sounds and intact distal pulses.  Pulses:      Femoral pulses are 2+ on the right side.      Popliteal pulses are 1+ on the right side and 1+ on the left side.       Dorsalis pedis pulses are 2+ on the right side and 2+ on the left side.       Posterior tibial pulses are 2+ on the right side and 2+ on the left side.  Mild Erythema bilateral R>L No leg edema  Pulmonary/Chest: Effort normal and breath sounds normal. No accessory muscle usage.  No respiratory distress.  Abdominal: Soft. Bowel sounds are normal.  Musculoskeletal: Normal range of motion.  Neurological: She is alert and oriented to person, place, and time.  Skin: Skin is warm and dry. There is erythema.  Vitals reviewed.  Radiology: No results found.  Laboratory examination:    CMP Latest Ref Rng & Units 09/03/2018 01/25/2016 09/21/2015  Glucose 65 - 99 mg/dL 103(H) 153(H) 103  BUN 8 - 27 mg/dL 30(H) 44.8(H) 22.5  Creatinine 0.57 - 1.00 mg/dL 1.22(H) 1.6(H) 1.2(H)  Sodium 134 - 144 mmol/L 139 140 141  Potassium 3.5 - 5.2 mmol/L 4.6 3.8 3.8  Chloride 96 - 106 mmol/L 101 - -  CO2 20 - 29 mmol/L 24 23 26   Calcium 8.7 - 10.3 mg/dL 10.8(H) 9.8 9.8  Total Protein 6.0 - 8.5 g/dL 6.4 6.2(L) 6.6  Total Bilirubin 0.0 - 1.2 mg/dL 0.5 0.49 0.56  Alkaline Phos 39 - 117 IU/L 96 79 88  AST 0 - 40 IU/L 27 23 22   ALT 0 - 32 IU/L 22 15 18    CBC Latest Ref Rng & Units 02/22/2016 01/25/2016 10/25/2015  WBC 3.9 - 10.3 10e3/uL 5.5 5.2 5.3  Hemoglobin 11.6 - 15.9 g/dL 13.3 13.5 13.3  Hematocrit 34.8 - 46.6 % 39.0 39.2 39.7  Platelets 145 - 400 10e3/uL 203 226 227   Lipid Panel     Component Value Date/Time   CHOL 151 09/03/2018 1426   TRIG 170 (H) 09/03/2018 1426   HDL 76 09/03/2018 1426   CHOLHDL 2.0 09/03/2018 1426   LDLCALC 41 09/03/2018 1426   HEMOGLOBIN A1C Lab Results  Component Value Date   HGBA1C 6.1  (H) 10/28/2014   MPG 128 10/28/2014   TSH No results for input(s): TSH in the last 8760 hours.  PRN Meds:. There are no discontinued medications. Current Meds  Medication Sig   ALPRAZolam (XANAX) 1 MG tablet as needed.    amLODipine (NORVASC) 5 MG tablet Take 1 tablet (5 mg total) by mouth daily.   amoxicillin-clavulanate (AUGMENTIN) 500-125 MG tablet Take 1 tablet (500 mg total) by mouth 2 (two) times daily.   anastrozole (ARIMIDEX) 1 MG tablet TAKE ONE (1) TABLET BY MOUTH AT BEDTIME   aspirin EC 81 MG tablet Take 81 mg by mouth daily.   atorvastatin (LIPITOR) 10 MG tablet Take 1 tablet (10 mg total) by mouth daily.   buPROPion (WELLBUTRIN XL) 150 MG 24 hr tablet Take 150 mg by mouth daily.   docusate sodium (COLACE) 100 MG capsule Take 200 mg by mouth daily as needed.    furosemide (LASIX) 20 MG tablet Take 1 tablet (20 mg total) by mouth daily. As directed. Take 1 tablet Am and 1 tablet 3 PM for the next 2 days (Patient taking differently: Take 20 mg by mouth daily. )   hydrALAZINE (APRESOLINE) 25 MG tablet Take 1 tablet (25 mg total) by mouth 3 (three) times daily.   losartan-hydrochlorothiazide (HYZAAR) 100-12.5 MG tablet Take 1 tablet by mouth daily.   metoprolol tartrate (LOPRESSOR) 25 MG tablet Take 1 tablet (25 mg total) by mouth 2 (two) times daily.   mirtazapine (REMERON) 30 MG tablet daily. 1/2-1   oxyCODONE (OXYCONTIN) 60 MG 12 hr tablet Take 1 tablet by mouth every 8 (eight) hours.   potassium chloride (K-DUR) 10 MEQ tablet Take 1 tablet (10 mEq total) by mouth daily. Take with lasix   rOPINIRole (REQUIP) 0.5 MG tablet Take 0.5 mg by mouth at bedtime as needed ("restlessness").  Cardiac Studies:    01/11/2015: Hospital Lower extremity arterial duplex Evidence of multilevel arterial occlusive disease bilaterally, left greater than right. Resting ankle-brachial index is mildly depressed on the right at 0.85. The left ABI is moderately depressed at rest at  0.65. On the right side, there likely is a component of aortoiliac inflow disease given monophasic waveforms throughout.  Echocardiogram  08/23/2014: LV size was normal. Wall thickness was normal. LVEF 55% to 60%. No regional wall motion abnormalities. Grade 1 diastolic dysfunction. Mildly thickened mitral leaflets. Mild MR. Atrial septal defect cannot be excluded by color doppler. Mild TR. PA peak pressure: 36 mmHg. IVC dilated. The respirophasic diameter changes were blunted (< 50%), consistent with elevated central venous pressure.   Assessment   No diagnosis found.  EKG 07/15/2018: Marked sinus bradycardia at the rate of 49 bpm, left atrial enlargement, normal axis.  Right bundle branch block.  Normal QT interval.  No evidence of ischemia.  Recommendations:   Patient has had significant improvement in leg edema with increasing diuretic, leg elevation, support stockings, and decreasing dose of amlodipine. Continues to have mild erythema, but significantly better. She is completing her antibiotic tomorrow. No ulcerations. I continue to feel that her leg swelling is related to venous insufficiency. Referral was placed to IR at last office visit for further evaluation, will follow up on this. Urged her to continue with lasix daily, but may take an additional dose for days that she has increased swelling.   She does continue to have dyspnea on exertion particularly with climbing stairs. She has previously had elevated BNP. She will have BNP performed today as well as BMP to follow up on kidney function. She is scheduled for echo in July. Also previously had moderate pulmonary hypertension. Suspect may be multifactoral as she also has emphysema from former tobacco use.     Miquel Dunn, MSN, APRN, FNP-C [facility]. [place] Office: [phone] Fax: [phone]

## 2018-10-29 ENCOUNTER — Telehealth: Payer: Self-pay | Admitting: Cardiology

## 2018-10-29 LAB — BRAIN NATRIURETIC PEPTIDE: BNP: 56.1 pg/mL (ref 0.0–100.0)

## 2018-10-29 LAB — BASIC METABOLIC PANEL
BUN/Creatinine Ratio: 26 (ref 12–28)
BUN: 46 mg/dL — ABNORMAL HIGH (ref 8–27)
CO2: 28 mmol/L (ref 20–29)
Calcium: 10.4 mg/dL — ABNORMAL HIGH (ref 8.7–10.3)
Chloride: 100 mmol/L (ref 96–106)
Creatinine, Ser: 1.74 mg/dL — ABNORMAL HIGH (ref 0.57–1.00)
GFR calc Af Amer: 33 mL/min/{1.73_m2} — ABNORMAL LOW (ref >59)
GFR calc non Af Amer: 29 mL/min/{1.73_m2} — ABNORMAL LOW (ref >59)
Glucose: 80 mg/dL (ref 65–99)
Potassium: 4.2 mmol/L (ref 3.5–5.2)
Sodium: 144 mmol/L (ref 134–144)

## 2018-10-29 NOTE — Telephone Encounter (Signed)
Discussed with patient recent lab results. BNP normal. Kidney function is worse due to aggressive diuresis. Would recommend continued daily use of lasix for now with close monitoring of her kidney function.

## 2018-11-06 ENCOUNTER — Other Ambulatory Visit: Payer: Self-pay

## 2018-11-06 DIAGNOSIS — I739 Peripheral vascular disease, unspecified: Secondary | ICD-10-CM

## 2018-11-06 MED ORDER — ATORVASTATIN CALCIUM 10 MG PO TABS: 10.0000 mg | ORAL_TABLET | Freq: Every day | ORAL | 1 refills | Status: DC

## 2018-11-09 ENCOUNTER — Other Ambulatory Visit: Payer: Self-pay | Admitting: Cardiology

## 2018-11-13 ENCOUNTER — Telehealth: Payer: Self-pay | Admitting: Cardiology

## 2018-11-13 NOTE — Telephone Encounter (Signed)
Patient called complaining that she had not heard back from [name] and I was asked to call patient. Patient called wanting to know if she could use Voltaren gel on her hip as well as OTC Theraworx for muscle cramps that are mostly at night and not new for her. Advised her that I am okay with as needed Voltaren gel and do not see any contraindication with Theraworx if she wants to see if it will help. She does not feel muscle cramps are medication side effect. Suspect related to venous insufficiency. She reports significant improvement in leg swelling and redness. Will decrease her lasix dose down to every other day. She is aware to only take potassium on days that she takes lasix. She has been compliant with support stockings. Will follow up as scheduled.

## 2018-11-14 ENCOUNTER — Other Ambulatory Visit: Payer: Self-pay | Admitting: Cardiology

## 2018-11-14 DIAGNOSIS — I872 Venous insufficiency (chronic) (peripheral): Secondary | ICD-10-CM

## 2018-11-26 ENCOUNTER — Ambulatory Visit (INDEPENDENT_AMBULATORY_CARE_PROVIDER_SITE_OTHER): Payer: Medicare Other

## 2018-11-26 ENCOUNTER — Other Ambulatory Visit: Payer: Self-pay

## 2018-11-26 DIAGNOSIS — R0600 Dyspnea, unspecified: Secondary | ICD-10-CM

## 2018-11-26 DIAGNOSIS — R0609 Other forms of dyspnea: Secondary | ICD-10-CM | POA: Diagnosis not present

## 2018-11-27 ENCOUNTER — Ambulatory Visit
Admission: RE | Admit: 2018-11-27 | Discharge: 2018-11-27 | Disposition: A | Discharge status: Home / Self Care | Payer: Medicare Other | Source: Ambulatory Visit | Attending: Cardiology | Admitting: Cardiology

## 2018-11-27 DIAGNOSIS — I872 Venous insufficiency (chronic) (peripheral): Secondary | ICD-10-CM

## 2018-11-27 NOTE — Consult Note (Addendum)
Chief Complaint: Patient was seen in consultation today for lower extremity vein insufficiency at the request of Miquel Dunn  Referring Physician(s): Miquel Dunn  History of Present Illness: [name] is a 73 y.o. female with history of peripheral arterial disease and lower extremity venous insufficiency.  She reports previous vein stripping in the left lower extremity many years ago.  Patient's main complaint is foot numbness.  She also complains of lower extremity swelling which has improved with compression stockings and diuretics.  She complains of intermittent cramping in both calves.  Patient's main concern is her feet.  She has an area of chronic irritation along the base of the left fifth metatarsal region and complains of prominent veins along the left lateral malleolus.  She has been wearing knee-high compression stockings, 20 to 30 mmHg, for the past few weeks.  She feels that the swelling is markedly improved and she has also been elevating her feet whenever possible.  Denies ulcerations or clots in the lower extremities.  Lower extremity arterial examination from 01/15/2015 demonstrated a right ABI 0.85 and left ABI of 0.65.  Past Medical History:  Diagnosis Date   Acute renal failure superimposed on stage 3 chronic kidney disease ([place]) 10/27/2014   Back pain, chronic    Breast cancer ([place]) 09/09/10   Cancer of colon ([place]) 05/24/2011   Centrilobular emphysema ([place]) 07/15/2018   CKD (chronic kidney disease), stage III ([facility])    Claudication, intermittent ([place]) 07/15/2018   Depression    GIB (gastrointestinal bleeding)    H/O tobacco use, presenting hazards to health Quit Dec 2019 05/03/2011   50 years, up to 3PPD quit last year.    Hx of varicose veins of lower extremity 07/15/2018   Hypertension    SDH (subdural hematoma) (HCC)    SOB (shortness of breath) 07/15/2018    Past Surgical History:  Procedure Laterality Date   BACK SURGERY      BREAST LUMPECTOMY  10/10/2010   Rt Breast   COLON SURGERY  04/2011   COLONOSCOPY  05/03/2011   Procedure: COLONOSCOPY;  Surgeon: Jeryl Columbia, MD;  Location: [facility] ENDOSCOPY;  Service: Endoscopy;  Laterality: N/A;   JOINT REPLACEMENT     R&L total shoulder replacements   LUMBAR DISC SURGERY     PARTIAL HYSTERECTOMY     SHOULDER SURGERY     TUMOR REMOVAL  Tumor removed R breast    Allergies: Patient has no known allergies.  Medications: Prior to Admission medications   Medication Sig Start Date End Date Taking? Authorizing Provider  ALPRAZolam Duanne Moron) 1 MG tablet as needed.  04/22/16   [provider]  amLODipine (NORVASC) 5 MG tablet Take 1 tablet (5 mg total) by mouth daily. 10/21/18 01/19/19  Miquel Dunn, NP  amoxicillin-clavulanate (AUGMENTIN) 500-125 MG tablet Take 1 tablet (500 mg total) by mouth 2 (two) times daily. 10/18/18   Miquel Dunn, NP  anastrozole (ARIMIDEX) 1 MG tablet TAKE ONE (1) TABLET BY MOUTH AT BEDTIME 07/07/15   Truitt Merle, MD  aspirin EC 81 MG tablet Take 81 mg by mouth daily.    [provider]  atorvastatin (LIPITOR) 10 MG tablet Take 1 tablet (10 mg total) by mouth at bedtime. 11/06/18 02/04/19  Adrian Prows, MD  buPROPion (WELLBUTRIN XL) 150 MG 24 hr tablet Take 150 mg by mouth daily.    [provider]  docusate sodium (COLACE) 100 MG capsule Take 200 mg by mouth daily as needed.  [provider]  furosemide (LASIX) 20 MG tablet Take 1 tablet (20 mg total) by mouth daily. As directed. Take 1 tablet Am and 1 tablet 3 PM for the next 2 days Patient taking differently: Take 20 mg by mouth daily.  10/18/18 01/16/19  Miquel Dunn, NP  hydrALAZINE (APRESOLINE) 25 MG tablet Take 1 tablet (25 mg total) by mouth 3 (three) times daily. 08/01/18   Adrian Prows, MD  losartan-hydrochlorothiazide (HYZAAR) 100-12.5 MG tablet Take 1 tablet by mouth daily.    [provider]  metoprolol tartrate (LOPRESSOR)  25 MG tablet Take 1 tablet (25 mg total) by mouth 2 (two) times daily. 10/08/18   Adrian Prows, MD  mirtazapine (REMERON) 30 MG tablet daily. 1/2-1 04/22/16   [provider]  oxyCODONE (OXYCONTIN) 60 MG 12 hr tablet Take 1 tablet by mouth every 8 (eight) hours.    [provider]  potassium chloride (K-DUR) 10 MEQ tablet TAKE 1 TABLET (10 MEQ TOTAL) BY MOUTH DAILY. TAKE WITH LASIX 11/12/18   Adrian Prows, MD  rOPINIRole (REQUIP) 0.5 MG tablet Take 0.5 mg by mouth at bedtime as needed ("restlessness").     [provider]     Family History  Problem Relation Age of Onset   Diabetes Mother    Hypertension Mother    Cancer Mother        leukemia   Sudden death Mother    Heart attack Mother    Anesthesia problems Neg Hx    Hypotension Neg Hx    Malignant hyperthermia Neg Hx    Pseudochol deficiency Neg Hx    Breast cancer Neg Hx     Social History   Socioeconomic History   Marital status: Divorced    Spouse name: Not on file   Number of children: 2   Years of education: Not on file   Highest education level: Not on file  Occupational History   Not on file  Social Needs   Financial resource strain: Not on file   Food insecurity    Worry: Not on file    Inability: Not on file   Transportation needs    Medical: Not on file    Non-medical: Not on file  Tobacco Use   Smoking status: Former Smoker    Packs/day: 0.75    Years: 30.00    Pack years: 22.50    Types: Cigarettes    Quit date: 04/2018    Years since quitting: 0.6   Smokeless tobacco: Former Systems developer    Quit date: 04/22/2010  Substance and Sexual Activity   Alcohol use: Yes    Comment: rarely    Drug use: No   Sexual activity: Not Currently  Lifestyle   Physical activity    Days per week: Not on file    Minutes per session: Not on file   Stress: Not on file  Relationships   Social connections    Talks on phone: Not on file    Gets together: Not on file    Attends  religious service: Not on file    Active member of club or organization: Not on file    Attends meetings of clubs or organizations: Not on file    Relationship status: Not on file  Other Topics Concern   Not on file  Social History Narrative   Lives alone in a one story home.  Has 2 children.     On disability for low back pain since the age of  51.     Education: high school.     Review of Systems  Musculoskeletal:       Bilateral feet numbness.  Intermittent cramping in the calves.  Skin: Positive for color change.       Redness and discoloration in the ankles and feet.    Vital Signs: BP (!) 164/83 (BP Location: Left Arm)    Pulse 60    Temp 98.4 F (36.9 C)    SpO2 94%   Physical Exam Constitutional:      General: She is not in acute distress. Cardiovascular:     Comments: Unable to palpate pedal pulses but confirm patency of the posterior tibial and dorsalis pedis arteries bilaterally with ultrasound. Musculoskeletal:     Comments: Right lower extremity: Scattered small varicose veins..  Minimal swelling at the lower ankle.  Left lower extremity Small varicosities on the medial left calf.   Minimal redness and discoloration along the lateral left ankle and foot particularly near the base of the fifth metatarsal bone.  Negative for ulcerations.  Minimal swelling  Neurological:     Mental Status: She is alert.        Imaging: US Venous Img Lower Bilateral  Result Date: 11/27/2018 CLINICAL DATA:  73 year old with history of superficial venous insufficiency and status post vein stripping in the left lower extremity. Patient's main complaint is foot numbness. She also complains of occasional cramping in the lower extremities. Patient also has lower extremity swelling which has recently improved now that she is wearing compression stockings. EXAM: BILATERAL LOWER EXTREMITY VENOUS DUPLEX ULTRASOUND TECHNIQUE: Gray-scale sonography with graded compression, as well as color  Doppler and duplex ultrasound, were performed to evaluate the deep and superficial veins of both lower extremities. Spectral Doppler was utilized to evaluate flow at rest and with distal augmentation maneuvers. A complete superficial venous insufficiency exam was performed in the upright standing position. I personally performed the technical portion of the exam. COMPARISON:  Lower extremity arterial study 01/11/2015 FINDINGS: Right lower extremity: Deep venous system: Normal compressibility, augmentation and color Doppler flow in the right common femoral vein, right femoral vein and right popliteal vein without thrombus. The right saphenofemoral junction is patent. Visualized right deep calf veins are patent without thrombus. Right profunda femoral vein is patent without thrombus. Superficial venous system: Normal caliber of the right thigh great saphenous vein without reflux. There is a small amount of reflux in the proximal calf GSV. Reflux in the proximal calf GSV measures 2.8 seconds and the vein measures up to 0.5 cm in diameter. 2.1 seconds of reflux in the distal calf GSV. Evidence for perforator branches in the right calf. Small varicosities in the right calf. Normal caliber of the right short saphenous vein without reflux. Left lower extremity: Deep venous system: Normal compressibility, augmentation and color Doppler flow in the left common femoral vein, left femoral vein and left popliteal vein without thrombus. Left profunda femoral vein is patent without thrombus. The left saphenofemoral junction is patent. Visualized left deep calf veins are patent without thrombus. Superficial venous system: The left great saphenous vein is absent in the left thigh and compatible with previous vein stripping procedure. Short segment of the left great saphenous vein in the proximal and mid calf that demonstrates reflux. Proximal calf left great saphenous vein has 3.6 seconds of reflux and measures up to 0.4 cm. Mid  calf great saphenous vein has 3.6 seconds of reflux measures 0.5 cm. Prominent varicosities along the medial left calf  measure up to 0.3 cm. Left short saphenous vein is small and difficult to visualize. No reflux in the left short saphenous vein. IMPRESSION: 1. Positive for superficial venous insufficiency in the right lower extremity. Reflux involving the right calf GSV along with perforator disease. 2. Prior vein stripping of the left great saphenous vein in the left thigh. There is superficial venous insufficiency involving a short segment of the residual great saphenous vein in left calf associated with varicosities. 3.  No evidence of deep venous thrombosis in the lower extremities. Electronically Signed   By: Markus Daft M.D.   On: 11/27/2018 15:30   [name] [facility]  Result Date: 11/27/2018 Please refer to "Notes" to see consult details.   Labs:  CBC: No results for input(s): WBC, HGB, HCT, PLT in the last 8760 hours.  COAGS: No results for input(s): INR, APTT in the last 8760 hours.  BMP: Recent Labs    09/03/18 1426 10/28/18 1618  NA 139 144  K 4.6 4.2  CL 101 100  CO2 24 28  GLUCOSE 103* 80  BUN 30* 46*  CALCIUM 10.8* 10.4*  CREATININE 1.22* 1.74*  GFRNONAA 44* 29*  GFRAA 51* 33*    LIVER FUNCTION TESTS: Recent Labs    09/03/18 1426  BILITOT 0.5  AST 27  ALT 22  ALKPHOS 96  PROT 6.4  ALBUMIN 4.6    TUMOR MARKERS: No results for input(s): AFPTM, CEA, CA199, CHROMGRNA in the last 8760 hours.  Assessment and Plan:  73 year old with bilateral lower extremity venous insufficiency.  History of left great saphenous vein stripping many years ago.  Patient's main complaint is bilateral foot numbness, swelling and intermittent calf cramping.  Ultrasound examination demonstrates reflux in the right calf great saphenous vein with small varicosities in the right calf.  CEAP classification in the right lower extremity is C2, Ep, As, Ap, Pr.  Short segment of the left  great saphenous vein in the left calf has reflux with associated varicosities and perforator branches.  CEAP classification in the left lower extremity is C2, Ep, As, Ap, Pr.  According to the patient, her lower extremity swelling has markedly improved since she started wearing the knee-high compression stockings.  Explained to the patient that we could treat the reflux in the right great saphenous vein with endovascular laser treatment and that we could treat the varicosities and left great saphenous vein in the left calf vein with ultrasound-guided foam sclerotherapy.  However, the patient does not feel that these particular areas are very symptomatic.  I suspect the foot numbness and cramping is more likely related to peripheral arterial disease rather than the venous insufficiency based on her symptoms and the degree of venous disease.  Patient was comfortable with this explanation of her symptoms and we discussed continuing to wear the compression stockings during the day and elevating the feet whenever possible.  No plans for endovascular therapy at this time.  Continue with lower extremity compression stockings.  If the lower extremity symptoms worsen despite compression stockings, we could reconsider endovascular treatment to both lower extremities.  Patient will follow-up as needed.  Thank you for this interesting consult.  I greatly enjoyed meeting Holly S Truman and look forward to participating in their care.  A copy of this report was sent to the requesting provider on this date.  Electronically Signed: Burman Riis 11/27/2018, 3:34 PM   I spent a total of  15 Minutes  in face to face in  clinical consultation, greater than 50% of which was counseling/coordinating care for venous insufficiency.

## 2018-11-28 ENCOUNTER — Telehealth: Payer: Self-pay

## 2018-11-28 MED ORDER — FUROSEMIDE 20 MG PO TABS: 20.0000 mg | ORAL_TABLET | Freq: Every day | ORAL | Status: DC

## 2018-11-29 ENCOUNTER — Other Ambulatory Visit: Payer: Self-pay

## 2018-11-29 DIAGNOSIS — I10 Essential (primary) hypertension: Secondary | ICD-10-CM

## 2018-11-29 MED ORDER — FUROSEMIDE 20 MG PO TABS: 20.0000 mg | ORAL_TABLET | Freq: Every day | ORAL | Status: DC

## 2018-12-02 ENCOUNTER — Other Ambulatory Visit: Payer: Self-pay

## 2018-12-02 NOTE — Progress Notes (Signed)
Normal LVEF of 57% with grade 1 diastolic dysfunction. Will discuss at [place].

## 2018-12-02 NOTE — Progress Notes (Signed)
Positive for venous insufficiency

## 2018-12-08 NOTE — Progress Notes (Signed)
Primary Physician/Referring:  Maude Leriche, PA-C  Patient ID: [name], female    DOB: May 27, 1945, 73 y.o.   MRN: [id number]  Chief Complaint  Patient presents with  . Leg Swelling  . Hypertension  . Follow-up    HPI: [name]  is a 73 y.o. female  with peripheral artery disease in the right lower leg at the level of tibial vessels and digital vessels is also suspected. On the left side, there is more significant depression of the ankle-brachial index at rest. There likely is a component of aortoiliac inflow disease as well as distal SFA occlusive disease.  Past medical history significant for hypertension, COPD,Stage III chronic kidney disease, history of breast cancer. She has had bilateral venous insufficiency and has had ablation in the past with resolution of leg edema.  She quit smoking in December 2019.   Patient has recently been followed for worsening leg edema and erythema that have improved with aggressive diuresis and regular use of support stockings.  She underwent venous insufficiency study and was evaluated by Dr. Anselm Pancoast.  She was noted to have reflux in the right calf great saphenous vein with small varicosities in the right calf there was a short segment of left great saphenous vein and left calf with associated varicosities and reflux and perforator branches.  As her symptoms have improved with support stockings, he recommended continuing this, but if symptoms again worsen could consider foam sclerotherapy.  She also underwent echocardiogram that was unchanged compared to 2016.  She has not had lower extremity arterial duplex performed that was ordered for reevaluation of PAD. She continues to have worsening right hip pain. Also has tingling numbness in bilateral feet and muscle spasms. She is using coconut water and homemade mixture of tumeric, ginger, and water that helps her muscle spasms.  States that she has been evaluated by Dr. Maureen Ralphs and underwent MRI  of right hip that had not worsened compared to previous studies. Leg edema has continued to be stable with support stockings and daily lasix. States that she has lost a close friend this week and is upset regarding this.     Past Medical History:  Diagnosis Date  . Acute renal failure superimposed on stage 3 chronic kidney disease ([place]) 10/27/2014  . Back pain, chronic   . Breast cancer ([place]) 09/09/10  . Cancer of colon ([place]) 05/24/2011  . Centrilobular emphysema ([place]) 07/15/2018  . CKD (chronic kidney disease), stage III ([place])   . Claudication, intermittent ([place]) 07/15/2018  . Depression   . GIB (gastrointestinal bleeding)   . H/O tobacco use, presenting hazards to health Quit Dec 2019 05/03/2011   50 years, up to 3PPD quit last year.   Marland Kitchen Hx of varicose veins of lower extremity 07/15/2018  . Hypertension   . SDH (subdural hematoma) ([place])   . SOB (shortness of breath) 07/15/2018    Past Surgical History:  Procedure Laterality Date  . BACK SURGERY    . BREAST LUMPECTOMY  10/10/2010   Rt Breast  . COLON SURGERY  04/2011  . COLONOSCOPY  05/03/2011   Procedure: COLONOSCOPY;  Surgeon: Jeryl Columbia, MD;  Location: [facility] END[facility]COPY;  Service: Endoscopy;  Laterality: N/A;  . JOINT REPLACEMENT     R&L total shoulder replacements  . LUMBAR DISC SURGERY    . PARTIAL HYSTERECTOMY    . SHOULDER SURGERY    . TUMOR REMOVAL  Tumor removed R breast    Social History   Socioeconomic History  .  Marital status: Divorced    Spouse name: Not on file  . Number of children: 2  . Years of education: Not on file  . Highest education level: Not on file  Occupational History  . Not on file  Social Needs  . Financial resource strain: Not on file  . Food insecurity    Worry: Not on file    Inability: Not on file  . Transportation needs    Medical: Not on file    Non-medical: Not on file  Tobacco Use  . Smoking status: Former Smoker    Packs/day: 0.75    Years: 30.00    Pack years: 22.50    Types:  Cigarettes    Quit date: 04/2018    Years since quitting: 0.6  . Smokeless tobacco: Former Systems developer    Quit date: 04/22/2010  Substance and Sexual Activity  . Alcohol use: Yes    Comment: rarely   . Drug use: No  . Sexual activity: Not Currently  Lifestyle  . Physical activity    Days per week: Not on file    Minutes per session: Not on file  . Stress: Not on file  Relationships  . Social Herbalist on phone: Not on file    Gets together: Not on file    Attends religious service: Not on file    Active member of club or organization: Not on file    Attends meetings of clubs or organizations: Not on file    Relationship status: Not on file  . Intimate partner violence    Fear of current or ex partner: Not on file    Emotionally abused: Not on file    Physically abused: Not on file    Forced sexual activity: Not on file  Other Topics Concern  . Not on file  Social History Narrative   Lives alone in a one story home.  Has 2 children.     On disability for low back pain since the age of 89.     Education: high school.    Review of Systems  Constitution: Negative for decreased appetite, malaise/fatigue, weight gain and weight loss.  Eyes: Negative for visual disturbance.  Cardiovascular: Positive for claudication, dyspnea on exertion and leg swelling. Negative for chest pain, orthopnea, palpitations and syncope.  Respiratory: Negative for hemoptysis and wheezing.   Endocrine: Negative for cold intolerance and heat intolerance.  Hematologic/Lymphatic: Does not bruise/bleed easily.  Skin: Negative for nail changes.  Musculoskeletal: Negative for muscle weakness and myalgias.  Gastrointestinal: Negative for abdominal pain, change in bowel habit, nausea and vomiting.  Neurological: Negative for difficulty with concentration, dizziness, focal weakness and headaches.  Psychiatric/Behavioral: Negative for altered mental status and suicidal ideas.  All other systems reviewed  and are negative.     Objective  Blood pressure (!) 107/54, pulse (!) 56, height 5' (1.524 m), weight 120 lb (54.4 kg), SpO2 94 %. Body mass index is 23.44 kg/m.    Physical Exam  Constitutional: She is oriented to person, place, and time. Vital signs are normal. She appears well-developed and well-nourished.  HENT:  Head: Normocephalic and atraumatic.  Neck: Normal range of motion.  Cardiovascular: Normal rate, regular rhythm, normal heart sounds and intact distal pulses.  Pulses:      Femoral pulses are 2+ on the right side.      Popliteal pulses are 1+ on the right side and 1+ on the left side.  Dorsalis pedis pulses are 2+ on the right side and 2+ on the left side.       Posterior tibial pulses are 2+ on the right side and 2+ on the left side.  Mild Erythema bilateral R>L No leg edema  Pulmonary/Chest: Effort normal and breath sounds normal. No accessory muscle usage. No respiratory distress.  Abdominal: Soft. Bowel sounds are normal.  Musculoskeletal: Normal range of motion.  Neurological: She is alert and oriented to person, place, and time.  Skin: Skin is warm and dry. There is erythema.  Vitals reviewed.  Radiology: No results found.  Laboratory examination:    CMP Latest Ref Rng & Units 10/28/2018 09/03/2018 01/25/2016  Glucose 65 - 99 mg/dL 80 103(H) 153(H)  BUN 8 - 27 mg/dL 46(H) 30(H) 44.8(H)  Creatinine 0.57 - 1.00 mg/dL 1.74(H) 1.22(H) 1.6(H)  Sodium 134 - 144 mmol/L 144 139 140  Potassium 3.5 - 5.2 mmol/L 4.2 4.6 3.8  Chloride 96 - 106 mmol/L 100 101 -  CO2 20 - 29 mmol/L 28 24 23   Calcium 8.7 - 10.3 mg/dL 10.4(H) 10.8(H) 9.8  Total Protein 6.0 - 8.5 g/dL - 6.4 6.2(L)  Total Bilirubin 0.0 - 1.2 mg/dL - 0.5 0.49  Alkaline Phos 39 - 117 IU/L - 96 79  AST 0 - 40 IU/L - 27 23  ALT 0 - 32 IU/L - 22 15   CBC Latest Ref Rng & Units 02/22/2016 01/25/2016 10/25/2015  WBC 3.9 - 10.3 10e3/uL 5.5 5.2 5.3  Hemoglobin 11.6 - 15.9 g/dL 13.3 13.5 13.3  Hematocrit 34.8 -  46.6 % 39.0 39.2 39.7  Platelets 145 - 400 10e3/uL 203 226 227   Lipid Panel     Component Value Date/Time   CHOL 151 09/03/2018 1426   TRIG 170 (H) 09/03/2018 1426   HDL 76 09/03/2018 1426   CHOLHDL 2.0 09/03/2018 1426   LDLCALC 41 09/03/2018 1426   HEMOGLOBIN A1C Lab Results  Component Value Date   HGBA1C 6.1 (H) 10/28/2014   MPG 128 10/28/2014   TSH No results for input(s): TSH in the last 8760 hours.  PRN Meds:. Medications Discontinued During This Encounter  Medication Reason  . amoxicillin-clavulanate (AUGMENTIN) 500-125 MG tablet Error  . anastrozole (ARIMIDEX) 1 MG tablet Error  . docusate sodium (COLACE) 100 MG capsule Error   Current Meds  Medication Sig  . ALPRAZolam (XANAX) 1 MG tablet as needed.   Marland Kitchen amLODipine (NORVASC) 5 MG tablet Take 1 tablet (5 mg total) by mouth daily.  Marland Kitchen aspirin EC 81 MG tablet Take 81 mg by mouth daily.  Marland Kitchen atorvastatin (LIPITOR) 10 MG tablet Take 1 tablet (10 mg total) by mouth at bedtime.  Marland Kitchen buPROPion (WELLBUTRIN XL) 150 MG 24 hr tablet Take 150 mg by mouth daily.  . furosemide (LASIX) 20 MG tablet Take 1 tablet (20 mg total) by mouth daily. As directed. Take 1 tablet Am and 1 tablet 3 PM for the next 2 days (Patient taking differently: Take 20 mg by mouth daily. )  . hydrALAZINE (APRESOLINE) 25 MG tablet Take 1 tablet (25 mg total) by mouth 3 (three) times daily.  Marland Kitchen losartan-hydrochlorothiazide (HYZAAR) 100-12.5 MG tablet Take 1 tablet by mouth daily.  . metoprolol tartrate (LOPRESSOR) 25 MG tablet Take 1 tablet (25 mg total) by mouth 2 (two) times daily.  . mirtazapine (REMERON) 30 MG tablet daily. 1/2-1  . oxyCODONE (OXYCONTIN) 60 MG 12 hr tablet Take 1 tablet by mouth every 8 (eight) hours.  . potassium  chloride (K-DUR) 10 MEQ tablet TAKE 1 TABLET (10 MEQ TOTAL) BY MOUTH DAILY. TAKE WITH LASIX  . rOPINIRole (REQUIP) 0.5 MG tablet Take 0.5 mg by mouth at bedtime as needed ("restlessness").   . [DISCONTINUED] amoxicillin-clavulanate  (AUGMENTIN) 500-125 MG tablet Take 1 tablet (500 mg total) by mouth 2 (two) times daily.  . [DISCONTINUED] anastrozole (ARIMIDEX) 1 MG tablet TAKE ONE (1) TABLET BY MOUTH AT BEDTIME  . [DISCONTINUED] docusate sodium (COLACE) 100 MG capsule Take 200 mg by mouth daily as needed.     Cardiac Studies:    01/11/2015: Hospital Lower extremity arterial duplex Evidence of multilevel arterial occlusive disease bilaterally, left greater than right. Resting ankle-brachial index is mildly depressed on the right at 0.85. The left ABI is moderately depressed at rest at 0.65. On the right side, there likely is a component of aortoiliac inflow disease given monophasic waveforms throughout.  Echocardiogram 11/26/2018: Normal LV systolic function with EF 57%. Left ventricle cavity is normal in size. Normal global wall motion. Doppler evidence of grade I (impaired) diastolic dysfunction, normal LAP. Calculated EF 57%. Left atrial cavity is mildly dilated. Mild (Grade I) mitral regurgitation. Mild to moderate tricuspid regurgitation. Estimated pulmonary artery systolic pressure is 11-65 mmHg. IVC is dilated with blunted respiratory response. Estimated RA pressure 10-15 mmHg.   Assessment     ICD-10-CM   1. Venous insufficiency of both lower extremities  I87.2   2. Dyspnea on exertion  R06.09   3. PAD (peripheral artery disease) (HCC)  I73.9   4. CKD (chronic kidney disease) stage 4, GFR 15-29 ml/min (HCC)  N18.4     EKG 07/15/2018: Marked sinus bradycardia at the rate of 49 bpm, left atrial enlargement, normal axis.  Right bundle branch block.  Normal QT interval.  No evidence of ischemia.  Recommendations:   I have discussed recently obtained echocardiogram with the patient, no changes compared to 2016.  Suspect her dyspnea on exertion is multifactorial from COPD and inactivity.  Leg edema has been evaluated by Dr. in and was noted to have venous insufficiency, but will continue with support stockings  and Lasix unless her symptoms worsen.  Advised her to continue with leg elevation with sitting.  She does have history of PAD and is now complaining of bilateral feet numbness and worsening right hip pain that is been evaluated by orthopedics without worsening arthritis.  We will reschedule her lower extremity arterial duplex for further evaluation. No evidence of ischemic limb. Continue with aspirin and statin therapy. Blood pressure is well controlled.   Labs performed in June, showed worsening kidney function at stage IV.  Related to aggressive diuresis at that time due to her worsening leg swelling.  I will reevaluate her kidney function with decreased dose of Lasix and notify her of results.  I will see her back in 2 months for follow-up, but encouraged her to contact me sooner if needed.  She does mention with recent stress in her life she has again started back occasionally smoking a cigarette she is aware of long-term continued risk of smoking and will work to continue to quit.    Miquel Dunn, MSN, APRN, FNP-C [facility]. [place] Office: [phone] Fax: [phone]

## 2018-12-09 ENCOUNTER — Encounter: Payer: Self-pay | Admitting: Cardiology

## 2018-12-09 ENCOUNTER — Ambulatory Visit (INDEPENDENT_AMBULATORY_CARE_PROVIDER_SITE_OTHER): Payer: Medicare Other | Admitting: Cardiology

## 2018-12-09 ENCOUNTER — Other Ambulatory Visit: Payer: Self-pay

## 2018-12-09 VITALS — BP 107/54 | HR 56 | Ht 60.0 in | Wt 120.0 lb

## 2018-12-09 DIAGNOSIS — N184 Chronic kidney disease, stage 4 (severe): Secondary | ICD-10-CM | POA: Diagnosis not present

## 2018-12-09 DIAGNOSIS — R0600 Dyspnea, unspecified: Secondary | ICD-10-CM

## 2018-12-09 DIAGNOSIS — I872 Venous insufficiency (chronic) (peripheral): Secondary | ICD-10-CM | POA: Diagnosis not present

## 2018-12-09 DIAGNOSIS — I739 Peripheral vascular disease, unspecified: Secondary | ICD-10-CM

## 2018-12-09 DIAGNOSIS — R0609 Other forms of dyspnea: Secondary | ICD-10-CM

## 2018-12-10 LAB — BASIC METABOLIC PANEL
BUN/Creatinine Ratio: 24 (ref 12–28)
BUN: 32 mg/dL — ABNORMAL HIGH (ref 8–27)
CO2: 27 mmol/L (ref 20–29)
Calcium: 10.8 mg/dL — ABNORMAL HIGH (ref 8.7–10.3)
Chloride: 101 mmol/L (ref 96–106)
Creatinine, Ser: 1.36 mg/dL — ABNORMAL HIGH (ref 0.57–1.00)
GFR calc Af Amer: 45 mL/min/{1.73_m2} — ABNORMAL LOW (ref >59)
GFR calc non Af Amer: 39 mL/min/{1.73_m2} — ABNORMAL LOW (ref >59)
Glucose: 84 mg/dL (ref 65–99)
Potassium: 5.2 mmol/L (ref 3.5–5.2)
Sodium: 143 mmol/L (ref 134–144)

## 2018-12-12 ENCOUNTER — Other Ambulatory Visit: Payer: Self-pay | Admitting: Cardiology

## 2018-12-12 DIAGNOSIS — I739 Peripheral vascular disease, unspecified: Secondary | ICD-10-CM

## 2019-01-10 ENCOUNTER — Telehealth: Payer: Self-pay | Admitting: Cardiology

## 2019-01-14 ENCOUNTER — Ambulatory Visit (INDEPENDENT_AMBULATORY_CARE_PROVIDER_SITE_OTHER): Payer: Medicare Other

## 2019-01-14 ENCOUNTER — Encounter (INDEPENDENT_AMBULATORY_CARE_PROVIDER_SITE_OTHER): Payer: Self-pay

## 2019-01-14 ENCOUNTER — Other Ambulatory Visit: Payer: Self-pay

## 2019-01-14 DIAGNOSIS — I739 Peripheral vascular disease, unspecified: Secondary | ICD-10-CM

## 2019-02-10 ENCOUNTER — Ambulatory Visit (INDEPENDENT_AMBULATORY_CARE_PROVIDER_SITE_OTHER): Payer: Medicare Other | Admitting: Cardiology

## 2019-02-10 ENCOUNTER — Encounter: Payer: Self-pay | Admitting: Cardiology

## 2019-02-10 ENCOUNTER — Other Ambulatory Visit: Payer: Self-pay

## 2019-02-10 VITALS — BP 95/47 | HR 58 | Ht 61.0 in | Wt 125.0 lb

## 2019-02-10 DIAGNOSIS — S81802A Unspecified open wound, left lower leg, initial encounter: Secondary | ICD-10-CM | POA: Diagnosis not present

## 2019-02-10 DIAGNOSIS — N184 Chronic kidney disease, stage 4 (severe): Secondary | ICD-10-CM

## 2019-02-10 DIAGNOSIS — I129 Hypertensive chronic kidney disease with stage 1 through stage 4 chronic kidney disease, or unspecified chronic kidney disease: Secondary | ICD-10-CM

## 2019-02-10 DIAGNOSIS — I739 Peripheral vascular disease, unspecified: Secondary | ICD-10-CM | POA: Diagnosis not present

## 2019-02-10 DIAGNOSIS — I872 Venous insufficiency (chronic) (peripheral): Secondary | ICD-10-CM

## 2019-02-10 NOTE — Progress Notes (Signed)
Left heel non-healing wound

## 2019-02-10 NOTE — Progress Notes (Signed)
Primary Physician/Referring:  Maude Leriche, PA-C  Patient ID: [name], female    DOB: 1946-01-23, 73 y.o.   MRN: [id number]  Chief Complaint  Patient presents with  . PAD  . Leg Swelling  . Follow-up    HPI: Holly S Rash  is a 73 y.o. female  with peripheral artery disease, hypertension, COPD,Stage III chronic kidney disease, history of breast cancer. She has had bilateral venous insufficiency and has had ablation in the past, former tobacco use since December 2019.   Due to worsening right hip pain and occasional tingling/numbness in bilateral feet, lower extremity arterial duplex and now presents to discuss results.  She has been evaluated by Dr. Maureen Ralphs and underwent an MRI of the right hip that did not show worsening arthritic changes. She is to follow back up with him in October. She has pain in her legs at night with just sitting resting, tingling/numbness in bilateral feet, and now mentions left heel ulcer that she states has been present for a long time, but does not seem to ever heal. She has pain mostly, in her right leg/thigh area after walking a certain distance that is worse than compared to 6 months ago.   Leg edema has remained stable with use of support stockings and daily Lasix.  She has been evaluated by vascular surgery, and although found to have venous insufficiency in bilateral lower extremities, and the symptoms worsen continue with support stockings.    Past Medical History:  Diagnosis Date  . Acute renal failure superimposed on stage 3 chronic kidney disease ([place]) 10/27/2014  . Back pain, chronic   . Breast cancer ([place]) 09/09/10  . Cancer of colon ([place]) 05/24/2011  . Centrilobular emphysema ([place]) 07/15/2018  . CKD (chronic kidney disease), stage III ([place])   . Claudication, intermittent ([place]) 07/15/2018  . Depression   . GIB (gastrointestinal bleeding)   . H/O tobacco use, presenting hazards to health Quit Dec 2019 05/03/2011   50 years, up to  3PPD quit last year.   Marland Kitchen Hx of varicose veins of lower extremity 07/15/2018  . Hypertension   . SDH (subdural hematoma) ([place])   . SOB (shortness of breath) 07/15/2018    Past Surgical History:  Procedure Laterality Date  . BACK SURGERY    . BREAST LUMPECTOMY  10/10/2010   Rt Breast  . COLON SURGERY  04/2011  . COLONOSCOPY  05/03/2011   Procedure: COLONOSCOPY;  Surgeon: Jeryl Columbia, MD;  Location: [facility] ENDOSCOPY;  Service: Endoscopy;  Laterality: N/A;  . JOINT REPLACEMENT     R&L total shoulder replacements  . LUMBAR DISC SURGERY    . PARTIAL HYSTERECTOMY    . SHOULDER SURGERY    . TUMOR REMOVAL  Tumor removed R breast    Social History   Socioeconomic History  . Marital status: Divorced    Spouse name: Not on file  . Number of children: 2  . Years of education: Not on file  . Highest education level: Not on file  Occupational History  . Not on file  Social Needs  . Financial resource strain: Not on file  . Food insecurity    Worry: Not on file    Inability: Not on file  . Transportation needs    Medical: Not on file    Non-medical: Not on file  Tobacco Use  . Smoking status: Former Smoker    Packs/day: 0.75    Years: 30.00    Pack years: 22.50  Types: Cigarettes    Quit date: 2020    Years since quitting: 0.7  . Smokeless tobacco: Former Systems developer    Quit date: 04/22/2010  Substance and Sexual Activity  . Alcohol use: Yes    Comment: rarely   . Drug use: No  . Sexual activity: Not Currently  Lifestyle  . Physical activity    Days per week: Not on file    Minutes per session: Not on file  . Stress: Not on file  Relationships  . Social Herbalist on phone: Not on file    Gets together: Not on file    Attends religious service: Not on file    Active member of club or organization: Not on file    Attends meetings of clubs or organizations: Not on file    Relationship status: Not on file  . Intimate partner violence    Fear of current or ex partner:  Not on file    Emotionally abused: Not on file    Physically abused: Not on file    Forced sexual activity: Not on file  Other Topics Concern  . Not on file  Social History Narrative   Lives alone in a one story home.  Has 2 children.     On disability for low back pain since the age of 42.     Education: high school.    Review of Systems  Constitution: Negative for decreased appetite, malaise/fatigue, weight gain and weight loss.  Eyes: Negative for visual disturbance.  Cardiovascular: Positive for claudication, dyspnea on exertion and leg swelling. Negative for chest pain, orthopnea, palpitations and syncope.  Respiratory: Negative for hemoptysis and wheezing.   Endocrine: Negative for cold intolerance and heat intolerance.  Hematologic/Lymphatic: Does not bruise/bleed easily.  Skin: Positive for poor wound healing (left heel wound). Negative for nail changes.  Musculoskeletal: Negative for muscle weakness and myalgias.  Gastrointestinal: Negative for abdominal pain, change in bowel habit, nausea and vomiting.  Neurological: Negative for difficulty with concentration, dizziness, focal weakness and headaches.  Psychiatric/Behavioral: Negative for altered mental status and suicidal ideas.  All other systems reviewed and are negative.     Objective  Blood pressure (!) 95/47, pulse (!) 58, height 5\' 1"  (1.549 m), weight 125 lb (56.7 kg), SpO2 94 %. Body mass index is 23.62 kg/m.    Physical Exam  Constitutional: She is oriented to person, place, and time. Vital signs are normal. She appears well-developed and well-nourished.  HENT:  Head: Normocephalic and atraumatic.  Neck: Normal range of motion.  Cardiovascular: Normal rate, regular rhythm, normal heart sounds and intact distal pulses.  Pulses:      Femoral pulses are 2+ on the right side.      Popliteal pulses are 1+ on the right side and 1+ on the left side.       Dorsalis pedis pulses are 2+ on the right side and 2+ on the  left side.       Posterior tibial pulses are 2+ on the right side and 2+ on the left side.  Mild Erythema bilateral R>L No leg edema  Pulmonary/Chest: Effort normal and breath sounds normal. No accessory muscle usage. No respiratory distress.  Abdominal: Soft. Bowel sounds are normal.  Musculoskeletal: Normal range of motion.  Neurological: She is alert and oriented to person, place, and time.  Skin: Skin is warm and dry. There is erythema.  Vitals reviewed.  Radiology: No results found.  Laboratory examination:  CMP Latest Ref Rng & Units 12/09/2018 10/28/2018 09/03/2018  Glucose 65 - 99 mg/dL 84 80 103(H)  BUN 8 - 27 mg/dL 32(H) 46(H) 30(H)  Creatinine 0.57 - 1.00 mg/dL 1.36(H) 1.74(H) 1.22(H)  Sodium 134 - 144 mmol/L 143 144 139  Potassium 3.5 - 5.2 mmol/L 5.2 4.2 4.6  Chloride 96 - 106 mmol/L 101 100 101  CO2 20 - 29 mmol/L 27 28 24   Calcium 8.7 - 10.3 mg/dL 10.8(H) 10.4(H) 10.8(H)  Total Protein 6.0 - 8.5 g/dL - - 6.4  Total Bilirubin 0.0 - 1.2 mg/dL - - 0.5  Alkaline Phos 39 - 117 IU/L - - 96  AST 0 - 40 IU/L - - 27  ALT 0 - 32 IU/L - - 22   CBC Latest Ref Rng & Units 02/22/2016 01/25/2016 10/25/2015  WBC 3.9 - 10.3 10e3/uL 5.5 5.2 5.3  Hemoglobin 11.6 - 15.9 g/dL 13.3 13.5 13.3  Hematocrit 34.8 - 46.6 % 39.0 39.2 39.7  Platelets 145 - 400 10e3/uL 203 226 227   Lipid Panel     Component Value Date/Time   CHOL 151 09/03/2018 1426   TRIG 170 (H) 09/03/2018 1426   HDL 76 09/03/2018 1426   CHOLHDL 2.0 09/03/2018 1426   LDLCALC 41 09/03/2018 1426   HEMOGLOBIN A1C Lab Results  Component Value Date   HGBA1C 6.1 (H) 10/28/2014   MPG 128 10/28/2014   TSH No results for input(s): TSH in the last 8760 hours.  PRN Meds:. There are no discontinued medications. Current Meds  Medication Sig  . ALPRAZolam (XANAX) 1 MG tablet as needed.   Marland Kitchen amLODipine (NORVASC) 5 MG tablet Take 1 tablet (5 mg total) by mouth daily.  Marland Kitchen aspirin EC 81 MG tablet Take 81 mg by mouth daily.  Marland Kitchen  atorvastatin (LIPITOR) 10 MG tablet Take 1 tablet (10 mg total) by mouth at bedtime.  Marland Kitchen buPROPion (WELLBUTRIN XL) 150 MG 24 hr tablet Take 150 mg by mouth daily.  . furosemide (LASIX) 20 MG tablet Take 1 tablet (20 mg total) by mouth daily. As directed. Take 1 tablet Am and 1 tablet 3 PM for the next 2 days (Patient taking differently: Take 20 mg by mouth daily. )  . hydrALAZINE (APRESOLINE) 25 MG tablet Take 1 tablet (25 mg total) by mouth 3 (three) times daily.  Marland Kitchen losartan-hydrochlorothiazide (HYZAAR) 100-12.5 MG tablet Take 1 tablet by mouth daily.  . metoprolol tartrate (LOPRESSOR) 25 MG tablet Take 1 tablet (25 mg total) by mouth 2 (two) times daily.  . mirtazapine (REMERON) 30 MG tablet daily. 1/2-1  . oxyCODONE (OXYCONTIN) 60 MG 12 hr tablet Take 1 tablet by mouth every 8 (eight) hours.  . potassium chloride (K-DUR) 10 MEQ tablet TAKE 1 TABLET (10 MEQ TOTAL) BY MOUTH DAILY. TAKE WITH LASIX  . rOPINIRole (REQUIP) 0.5 MG tablet Take 0.5 mg by mouth at bedtime as needed ("restlessness").     Cardiac Studies:    Lower Extremity Arterial Duplex 01/14/2019: There is monophasic waveform noted in the right external iliac and CFA suggests proximal significant disease. There is severe diffuse mixed plaque noted throughout the right lower extremity.  Moderate velocity increase at the left distal superficial femoral artery, >50% stenosis. There is severe diffuse mixed plaque throughout the left lower extremity This exam reveals moderately decreased perfusion of the right lower extremity, noted at the dorsalis pedis artery level (ABI 0.75) and moderately decreased perfusion of the left lower extremity, noted at the post tibial artery level (ABI 0.58).  No significant change from report of 01/11/2015.   01/11/2015: Hospital Lower extremity arterial duplex Evidence of multilevel arterial occlusive disease bilaterally, left greater than right. Resting ankle-brachial index is mildly depressed on the right  at 0.85. The left ABI is moderately depressed at rest at 0.65. On the right side, there likely is a component of aortoiliac inflow disease given monophasic waveforms throughout.  Echocardiogram 11/26/2018: Normal LV systolic function with EF 57%. Left ventricle cavity is normal in size. Normal global wall motion. Doppler evidence of grade I (impaired) diastolic dysfunction, normal LAP. Calculated EF 57%. Left atrial cavity is mildly dilated. Mild (Grade I) mitral regurgitation. Mild to moderate tricuspid regurgitation. Estimated pulmonary artery systolic pressure is 96-04 mmHg. IVC is dilated with blunted respiratory response. Estimated RA pressure 10-15 mmHg.   Assessment     ICD-10-CM   1. PAD (peripheral artery disease) (HCC)  I73.9 EKG 12-Lead  2. Non-healing wound of lower extremity, left, initial encounter  S81.802A   3. Venous insufficiency of both lower extremities  I87.2   4. CKD (chronic kidney disease) stage 4, GFR 15-29 ml/min (HCC)  N18.4     EKG 02/10/2019: Sinus bradycardia at 55 bpm, normal axis, RBBB. No changes from EKG in Feb 2020.  Recommendations:   I have discussed lower extremity arterial duplex that is essentially unchanged compared to 2016.  Left ABI is 0.58 and right ABI is 0.75.  She has majority of her claudication symptoms in her right thigh it may be related to abnormal findings noted on duplex.  She also has newly noted left lower extremity ulceration that is nonhealing.  Although her duplex is essentially unchanged compared to 2016, in view of her worsening claudication symptoms on the left lower leg and the wound, I recommended proceeding with peripheral angiogram for further evaluation.  While I do suspect she also has symptoms from arthritis and chronic back pain, I do feel that her PAD is also contributing to her symptoms.  She will also follow-up with Dr. Maureen Ralphs regarding management of back pain and right hip pain. Will schedule for peripheral arteriogram  and possible angioplasty given symptoms. Patient understands the risks, benefits, alternatives including medical therapy, CT angiography. Patient understands <1-2% risk of death, embolic complications, bleeding, infection, renal failure, urgent surgical revascularization, but not limited to these and wants to proceed.   I suspect her bilateral foot numbness/tingling sensation potentially related to neuropathy.  May consider addition of gabapentin, but will hold off for now.    She does have history of chronic kidney disease stage III-4 that has been stable.  Will need to closely monitor this with upcoming procedure.    In regard to venous insufficiency, leg edema has been stable with use of support stockings and daily Lasix.  We will continue the same.  I will see her back after her procedure for follow-up.   *I have discussed this case with Dr. Einar Gip and he participated in formulating the plan.*   Miquel Dunn, MSN, APRN, FNP-C [facility]. [place] Office: [phone] Fax: [phone]

## 2019-02-11 ENCOUNTER — Telehealth: Payer: Self-pay

## 2019-02-11 NOTE — Telephone Encounter (Signed)
Pt wants to know about if she can try gabapentin first before surgery; She wants you to call back not me

## 2019-02-13 ENCOUNTER — Encounter: Payer: Self-pay | Admitting: Cardiology

## 2019-02-18 ENCOUNTER — Telehealth: Payer: Self-pay

## 2019-02-18 NOTE — Telephone Encounter (Signed)
Pt wants to know if you can give her a call when you get a chance about a conversation you both had about a friend of hers; That's all she will tell me

## 2019-02-19 ENCOUNTER — Telehealth: Payer: Self-pay | Admitting: Cardiology

## 2019-02-19 NOTE — Telephone Encounter (Signed)
Patient had requested that I contact her friend Anderson Malta and discussed the indications for her procedure as well as the procedure itself.  Indications, her symptoms, lower extremity duplex findings and the procedure was discussed in detail.  All questions were answered.  Patient thanked me for the phone call, we will contact her regarding instructions for the hospital.

## 2019-03-07 ENCOUNTER — Other Ambulatory Visit: Payer: Self-pay | Admitting: Cardiology

## 2019-03-07 ENCOUNTER — Telehealth: Payer: Self-pay

## 2019-03-07 ENCOUNTER — Other Ambulatory Visit (HOSPITAL_COMMUNITY): Payer: Self-pay

## 2019-03-07 DIAGNOSIS — I739 Peripheral vascular disease, unspecified: Secondary | ICD-10-CM

## 2019-03-07 DIAGNOSIS — N184 Chronic kidney disease, stage 4 (severe): Secondary | ICD-10-CM

## 2019-03-07 NOTE — Telephone Encounter (Signed)
Pt called in regard to a upcoming surgery on her hip but she would like to talk to you about a spot on her back. Pt would like for you to call her back at [phone].

## 2019-03-07 NOTE — Telephone Encounter (Signed)
Called patient back. She wanted to see if we could remove a skin tag off of her back, which I advised we would not be able to. She will discuss with her PCP.

## 2019-03-08 LAB — BASIC METABOLIC PANEL
BUN/Creatinine Ratio: 28 (ref 12–28)
BUN: 49 mg/dL — ABNORMAL HIGH (ref 8–27)
CO2: 25 mmol/L (ref 20–29)
Calcium: 10.5 mg/dL — ABNORMAL HIGH (ref 8.7–10.3)
Chloride: 104 mmol/L (ref 96–106)
Creatinine, Ser: 1.73 mg/dL — ABNORMAL HIGH (ref 0.57–1.00)
GFR calc Af Amer: 34 mL/min/{1.73_m2} — ABNORMAL LOW (ref >59)
GFR calc non Af Amer: 29 mL/min/{1.73_m2} — ABNORMAL LOW (ref >59)
Glucose: 88 mg/dL (ref 65–99)
Potassium: 4.3 mmol/L (ref 3.5–5.2)
Sodium: 142 mmol/L (ref 134–144)

## 2019-03-08 LAB — CBC
Hematocrit: 41.2 % (ref 34.0–46.6)
Hemoglobin: 14 g/dL (ref 11.1–15.9)
MCH: 32.3 pg (ref 26.6–33.0)
MCHC: 34 g/dL (ref 31.5–35.7)
MCV: 95 fL (ref 79–97)
Platelets: 225 10*3/uL (ref 150–450)
RBC: 4.34 x10E6/uL (ref 3.77–5.28)
RDW: 13.1 % (ref 11.7–15.4)
WBC: 5.6 10*3/uL (ref 3.4–10.8)

## 2019-03-14 ENCOUNTER — Other Ambulatory Visit (HOSPITAL_COMMUNITY): Payer: Self-pay

## 2019-03-14 ENCOUNTER — Other Ambulatory Visit (HOSPITAL_COMMUNITY)
Admission: RE | Admit: 2019-03-14 | Discharge: 2019-03-14 | Disposition: A | Discharge status: Home / Self Care | Payer: Medicare Other | Source: Ambulatory Visit | Attending: Cardiology | Admitting: Cardiology

## 2019-03-14 DIAGNOSIS — Z20828 Contact with and (suspected) exposure to other viral communicable diseases: Secondary | ICD-10-CM | POA: Diagnosis not present

## 2019-03-14 DIAGNOSIS — Z01812 Encounter for preprocedural laboratory examination: Secondary | ICD-10-CM | POA: Diagnosis present

## 2019-03-17 LAB — NOVEL CORONAVIRUS, NAA (HOSP ORDER, SEND-OUT TO REF LAB; TAT 18-24 HRS): SARS-CoV-2, NAA: NOT DETECTED

## 2019-03-18 ENCOUNTER — Encounter (HOSPITAL_COMMUNITY)
Admission: RE | Disposition: A | Discharge status: Home / Self Care | Payer: Self-pay | Source: Home / Self Care | Attending: Cardiology

## 2019-03-18 ENCOUNTER — Ambulatory Visit (HOSPITAL_COMMUNITY)
Admission: RE | Admit: 2019-03-18 | Discharge: 2019-03-18 | Disposition: A | Discharge status: Home / Self Care | Payer: Medicare Other | Attending: Cardiology | Admitting: Cardiology

## 2019-03-18 ENCOUNTER — Other Ambulatory Visit: Payer: Self-pay

## 2019-03-18 ENCOUNTER — Ambulatory Visit: Payer: Medicare Other | Admitting: Cardiology

## 2019-03-18 DIAGNOSIS — N179 Acute kidney failure, unspecified: Secondary | ICD-10-CM | POA: Diagnosis not present

## 2019-03-18 DIAGNOSIS — I70213 Atherosclerosis of native arteries of extremities with intermittent claudication, bilateral legs: Secondary | ICD-10-CM | POA: Diagnosis present

## 2019-03-18 DIAGNOSIS — I129 Hypertensive chronic kidney disease with stage 1 through stage 4 chronic kidney disease, or unspecified chronic kidney disease: Secondary | ICD-10-CM | POA: Insufficient documentation

## 2019-03-18 DIAGNOSIS — F329 Major depressive disorder, single episode, unspecified: Secondary | ICD-10-CM | POA: Insufficient documentation

## 2019-03-18 DIAGNOSIS — I739 Peripheral vascular disease, unspecified: Secondary | ICD-10-CM | POA: Diagnosis present

## 2019-03-18 DIAGNOSIS — Z7982 Long term (current) use of aspirin: Secondary | ICD-10-CM | POA: Insufficient documentation

## 2019-03-18 DIAGNOSIS — Z87891 Personal history of nicotine dependence: Secondary | ICD-10-CM | POA: Insufficient documentation

## 2019-03-18 DIAGNOSIS — Z96611 Presence of right artificial shoulder joint: Secondary | ICD-10-CM | POA: Insufficient documentation

## 2019-03-18 DIAGNOSIS — Z79899 Other long term (current) drug therapy: Secondary | ICD-10-CM | POA: Insufficient documentation

## 2019-03-18 DIAGNOSIS — I70211 Atherosclerosis of native arteries of extremities with intermittent claudication, right leg: Secondary | ICD-10-CM

## 2019-03-18 DIAGNOSIS — Z96612 Presence of left artificial shoulder joint: Secondary | ICD-10-CM | POA: Diagnosis not present

## 2019-03-18 DIAGNOSIS — N183 Chronic kidney disease, stage 3 unspecified: Secondary | ICD-10-CM | POA: Insufficient documentation

## 2019-03-18 HISTORY — PX: PERIPHERAL VASCULAR INTERVENTION: CATH118257

## 2019-03-18 HISTORY — PX: ABDOMINAL AORTOGRAM W/LOWER EXTREMITY: CATH118223

## 2019-03-18 SURGERY — ABDOMINAL AORTOGRAM W/LOWER EXTREMITY
Anesthesia: LOCAL

## 2019-03-18 MED ORDER — SODIUM CHLORIDE 0.9 % IV SOLN: 250.0000 mL | INTRAVENOUS | Status: DC | PRN

## 2019-03-18 MED ORDER — SODIUM CHLORIDE 0.9 % IV BOLUS: 500.0000 mL | Freq: Once | INTRAVENOUS | Status: DC

## 2019-03-18 MED ORDER — SODIUM CHLORIDE 0.9% FLUSH: 3.0000 mL | INTRAVENOUS | Status: DC | PRN

## 2019-03-18 MED ORDER — ACETAMINOPHEN 325 MG PO TABS: 650.0000 mg | ORAL_TABLET | ORAL | Status: DC | PRN

## 2019-03-18 MED ORDER — FENTANYL CITRATE (PF) 100 MCG/2ML IJ SOLN
INTRAMUSCULAR | Status: AC
  Filled 2019-03-18: qty 2

## 2019-03-18 MED ORDER — MIDAZOLAM HCL 2 MG/2ML IJ SOLN
INTRAMUSCULAR | Status: DC | PRN
  Administered 2019-03-18 (×2): 1 mg via INTRAVENOUS

## 2019-03-18 MED ORDER — ONDANSETRON HCL 4 MG/2ML IJ SOLN: 4.0000 mg | Freq: Four times a day (QID) | INTRAMUSCULAR | Status: DC | PRN

## 2019-03-18 MED ORDER — LABETALOL HCL 5 MG/ML IV SOLN: 10.0000 mg | INTRAVENOUS | Status: DC | PRN

## 2019-03-18 MED ORDER — IODIXANOL 320 MG/ML IV SOLN
INTRAVENOUS | Status: DC | PRN
  Administered 2019-03-18: 5 mL via INTRA_ARTERIAL

## 2019-03-18 MED ORDER — LIDOCAINE HCL (PF) 1 % IJ SOLN
INTRAMUSCULAR | Status: AC
  Filled 2019-03-18: qty 30

## 2019-03-18 MED ORDER — FENTANYL CITRATE (PF) 100 MCG/2ML IJ SOLN
INTRAMUSCULAR | Status: DC | PRN
  Administered 2019-03-18 (×5): 50 ug via INTRAVENOUS

## 2019-03-18 MED ORDER — HEPARIN (PORCINE) IN NACL 1000-0.9 UT/500ML-% IV SOLN
INTRAVENOUS | Status: DC | PRN
  Administered 2019-03-18 (×2): 500 mL

## 2019-03-18 MED ORDER — HEPARIN SODIUM (PORCINE) 1000 UNIT/ML IJ SOLN
INTRAMUSCULAR | Status: AC
  Filled 2019-03-18: qty 1

## 2019-03-18 MED ORDER — SODIUM CHLORIDE 0.9 % IV SOLN
INTRAVENOUS | Status: DC
  Administered 2019-03-18 (×2): via INTRAVENOUS

## 2019-03-18 MED ORDER — SODIUM CHLORIDE 0.9% FLUSH: 3.0000 mL | Freq: Two times a day (BID) | INTRAVENOUS | Status: DC

## 2019-03-18 MED ORDER — HYDRALAZINE HCL 20 MG/ML IJ SOLN: 5.0000 mg | INTRAMUSCULAR | Status: DC | PRN

## 2019-03-18 MED ORDER — CLOPIDOGREL BISULFATE 75 MG PO TABS: 75.0000 mg | ORAL_TABLET | Freq: Every day | ORAL | 0 refills | Status: DC

## 2019-03-18 MED ORDER — LIDOCAINE HCL (PF) 1 % IJ SOLN
INTRAMUSCULAR | Status: DC | PRN
  Administered 2019-03-18 (×2): 15 mL

## 2019-03-18 MED ORDER — HEPARIN SODIUM (PORCINE) 1000 UNIT/ML IJ SOLN
INTRAMUSCULAR | Status: DC | PRN
  Administered 2019-03-18 (×2): 4000 [IU] via INTRAVENOUS

## 2019-03-18 MED ORDER — MIDAZOLAM HCL 2 MG/2ML IJ SOLN
INTRAMUSCULAR | Status: AC
  Filled 2019-03-18: qty 2

## 2019-03-18 SURGICAL SUPPLY — 23 items
CATH ANGIO 5F BER2 65CM (CATHETERS) ×1 IMPLANT
CATH CXI 2.3F 90 ANG (CATHETERS) ×1 IMPLANT
CATH OMNI FLUSH 5F 65CM (CATHETERS) ×1 IMPLANT
CATH SOFT-VU ST 4F 90CM (CATHETERS) ×1 IMPLANT
CLOSURE MYNX CONTROL 5F (Vascular Products) ×1 IMPLANT
DEVICE TORQUE .014-.018 (MISCELLANEOUS) IMPLANT
FILTER CO2 0.2 MICRON (VASCULAR PRODUCTS) ×1 IMPLANT
GUIDEWIRE ANGLED .035X150CM (WIRE) ×1 IMPLANT
GUIDEWIRE ASTATO XS 20G 300CM (WIRE) ×1 IMPLANT
KIT MICROPUNCTURE NIT STIFF (SHEATH) ×1 IMPLANT
KIT PV (KITS) ×4 IMPLANT
RESERVOIR CO2 (VASCULAR PRODUCTS) ×1 IMPLANT
SET FLUSH CO2 (MISCELLANEOUS) ×1 IMPLANT
SHEATH FLEX ANSEL ANG 5F 45CM (SHEATH) ×1 IMPLANT
SHEATH PINNACLE 5F 10CM (SHEATH) ×1 IMPLANT
SHEATH PROBE COVER 6X72 (BAG) ×1 IMPLANT
STOPCOCK MORSE 400PSI 3WAY (MISCELLANEOUS) ×1 IMPLANT
SYR MEDRAD MARK 7 150ML (SYRINGE) ×4 IMPLANT
TORQUE DEVICE .014-.018 (MISCELLANEOUS) ×4
TRANSDUCER W/STOPCOCK (MISCELLANEOUS) ×4 IMPLANT
TRAY PV CATH (CUSTOM PROCEDURE TRAY) ×4 IMPLANT
TUBING CIL FLEX 10 FLL-RA (TUBING) ×1 IMPLANT
WIRE HITORQ VERSACORE ST 145CM (WIRE) ×1 IMPLANT

## 2019-03-18 NOTE — H&P (Signed)
Primary Physician/Referring:  Maude Leriche, PA-C  Patient ID: Holly S Joshua, female    DOB: 1945-10-24, 73 y.o.   MRN: [id number]  Chief complaints: Leg pain, bilateral, right thigh and right hip pain.  HPI: [name]  is a 73 y.o. female  with peripheral artery disease, hypertension, COPD,Stage III chronic kidney disease, history of breast cancer. She has had bilateral venous insufficiency and has had ablation in the past, former tobacco use since December 2019.   She underwent lower extremity arterial duplex on 01/14/2019, which revealed moderate to severe decrease in ABI on the left and mild decrease on the right with severe plaque burden bilaterally.  Left SFA showed higher than 50% stenosis.  She is now scheduled for peripheral arteriogram in view of lifestyle limiting claudication.  She has been worked up by orthopedics and felt her symptoms are unrelated to joint issues.     She has pain in her legs at night with just sitting resting, tingling/numbness in bilateral feet, and  Left malleolar lesion that she states has been present for a long time, but does not seem to ever heal. She has pain mostly, in her right leg/thigh area after walking a certain distance that is worse than compared to 6 months ago.   Leg edema has remained stable with use of support stockings and daily Lasix.  She has been evaluated by vascular surgery, and although found to have venous insufficiency in bilateral lower extremities, and the symptoms worsen continue with support stockings..   Past Medical History:  Diagnosis Date  . Acute renal failure superimposed on stage 3 chronic kidney disease ([place]) 10/27/2014  . Back pain, chronic   . Breast cancer ([place]) 09/09/10  . Cancer of colon ([place]) 05/24/2011  . Centrilobular emphysema ([place]) 07/15/2018  . CKD (chronic kidney disease), stage III ([place])   . Claudication, intermittent ([place]) 07/15/2018  . Depression   . GIB (gastrointestinal bleeding)   . H/O  tobacco use, presenting hazards to health Quit Dec 2019 05/03/2011   50 years, up to 3PPD quit last year.   Marland Kitchen Hx of varicose veins of lower extremity 07/15/2018  . Hypertension   . SDH (subdural hematoma) ([place])   . SOB (shortness of breath) 07/15/2018    Past Surgical History:  Procedure Laterality Date  . BACK SURGERY    . BREAST LUMPECTOMY  10/10/2010   Rt Breast  . COLON SURGERY  04/2011  . COLONOSCOPY  05/03/2011   Procedure: COLONOSCOPY;  Surgeon: Jeryl Columbia, MD;  Location: [facility] ENDOSCOPY;  Service: Endoscopy;  Laterality: N/A;  . JOINT REPLACEMENT     R&L total shoulder replacements  . LUMBAR DISC SURGERY    . PARTIAL HYSTERECTOMY    . SHOULDER SURGERY    . TUMOR REMOVAL  Tumor removed R breast    Social History   Socioeconomic History  . Marital status: Divorced    Spouse name: Not on file  . Number of children: 2  . Years of education: Not on file  . Highest education level: Not on file  Occupational History  . Not on file  Social Needs  . Financial resource strain: Not on file  . Food insecurity    Worry: Not on file    Inability: Not on file  . Transportation needs    Medical: Not on file    Non-medical: Not on file  Tobacco Use  . Smoking status: Former Smoker    Packs/day: 0.75    Years:  30.00    Pack years: 22.50    Types: Cigarettes    Quit date: 2020    Years since quitting: 0.8  . Smokeless tobacco: Former Systems developer    Quit date: 04/22/2010  Substance and Sexual Activity  . Alcohol use: Yes    Comment: rarely   . Drug use: No  . Sexual activity: Not Currently  Lifestyle  . Physical activity    Days per week: Not on file    Minutes per session: Not on file  . Stress: Not on file  Relationships  . Social Herbalist on phone: Not on file    Gets together: Not on file    Attends religious service: Not on file    Active member of club or organization: Not on file    Attends meetings of clubs or organizations: Not on file     Relationship status: Not on file  . Intimate partner violence    Fear of current or ex partner: Not on file    Emotionally abused: Not on file    Physically abused: Not on file    Forced sexual activity: Not on file  Other Topics Concern  . Not on file  Social History Narrative   Lives alone in a one story home.  Has 2 children.     On disability for low back pain since the age of 42.     Education: high school.    Review of Systems  Constitution: Negative for decreased appetite, malaise/fatigue, weight gain and weight loss.  Eyes: Negative for visual disturbance.  Cardiovascular: Positive for claudication, dyspnea on exertion and leg swelling. Negative for chest pain, orthopnea, palpitations and syncope.  Respiratory: Negative for hemoptysis and wheezing.   Endocrine: Negative for cold intolerance and heat intolerance.  Hematologic/Lymphatic: Does not bruise/bleed easily.  Skin: Positive for poor wound healing (left heel wound). Negative for nail changes.  Musculoskeletal: Negative for muscle weakness and myalgias.  Gastrointestinal: Negative for abdominal pain, change in bowel habit, nausea and vomiting.  Neurological: Negative for difficulty with concentration, dizziness, focal weakness and headaches.  Psychiatric/Behavioral: Negative for altered mental status and suicidal ideas.  All other systems reviewed and are negative.     Objective  Blood pressure (!) 125/108, pulse (!) 104, temperature 98.4 F (36.9 C), temperature source Skin, resp. rate 18, height 5' (1.524 m), weight 55.3 kg, SpO2 96 %. Body mass index is 23.83 kg/m.    Physical Exam  Constitutional: She is oriented to person, place, and time. Vital signs are normal. She appears well-developed and well-nourished.  HENT:  Head: Normocephalic and atraumatic.  Neck: Normal range of motion.  Cardiovascular: Normal rate, regular rhythm, normal heart sounds and intact distal pulses.  Pulses:      Femoral pulses are 2+  on the right side.      Popliteal pulses are 1+ on the right side and 1+ on the left side.       Dorsalis pedis pulses are 1+ on the right side and 0 on the left side.       Posterior tibial pulses are 1+ on the right side and 0 on the left side.  Mild Erythema bilateral R>L. Left medial malleolus shows a small scar like lesion that does not appear ischemia. No discharge.  No leg edema  Pulmonary/Chest: Effort normal and breath sounds normal. No accessory muscle usage. No respiratory distress.  Abdominal: Soft. Bowel sounds are normal.  Musculoskeletal: Normal range of motion.  Neurological: She is alert and oriented to person, place, and time.  Skin: Skin is warm and dry. There is erythema.  Vitals reviewed.  Radiology: No results found.  Laboratory examination:    CMP Latest Ref Rng & Units 03/07/2019 12/09/2018 10/28/2018  Glucose 65 - 99 mg/dL 88 84 80  BUN 8 - 27 mg/dL 49(H) 32(H) 46(H)  Creatinine 0.57 - 1.00 mg/dL 1.73(H) 1.36(H) 1.74(H)  Sodium 134 - 144 mmol/L 142 143 144  Potassium 3.5 - 5.2 mmol/L 4.3 5.2 4.2  Chloride 96 - 106 mmol/L 104 101 100  CO2 20 - 29 mmol/L 25 27 28   Calcium 8.7 - 10.3 mg/dL 10.5(H) 10.8(H) 10.4(H)  Total Protein 6.0 - 8.5 g/dL - - -  Total Bilirubin 0.0 - 1.2 mg/dL - - -  Alkaline Phos 39 - 117 IU/L - - -  AST 0 - 40 IU/L - - -  ALT 0 - 32 IU/L - - -   CBC Latest Ref Rng & Units 03/07/2019 02/22/2016 01/25/2016  WBC 3.4 - 10.8 x10E3/uL 5.6 5.5 5.2  Hemoglobin 11.1 - 15.9 g/dL 14.0 13.3 13.5  Hematocrit 34.0 - 46.6 % 41.2 39.0 39.2  Platelets 150 - 450 x10E3/uL 225 203 226   Lipid Panel     Component Value Date/Time   CHOL 151 09/03/2018 1426   TRIG 170 (H) 09/03/2018 1426   HDL 76 09/03/2018 1426   CHOLHDL 2.0 09/03/2018 1426   LDLCALC 41 09/03/2018 1426   HEMOGLOBIN A1C Lab Results  Component Value Date   HGBA1C 6.1 (H) 10/28/2014   MPG 128 10/28/2014   TSH No results for input(s): TSH in the last 8760 hours.  PRN Meds:.  There are no discontinued medications. Current Meds  Medication Sig  . ALPRAZolam (XANAX) 1 MG tablet as needed.   Marland Kitchen amLODipine (NORVASC) 5 MG tablet Take 1 tablet (5 mg total) by mouth daily.  Marland Kitchen aspirin EC 81 MG tablet Take 81 mg by mouth daily.  Marland Kitchen atorvastatin (LIPITOR) 10 MG tablet Take 1 tablet (10 mg total) by mouth at bedtime.  Marland Kitchen buPROPion (WELLBUTRIN XL) 150 MG 24 hr tablet Take 150 mg by mouth daily.  . furosemide (LASIX) 20 MG tablet Take 1 tablet (20 mg total) by mouth daily. As directed. Take 1 tablet Am and 1 tablet 3 PM for the next 2 days (Patient taking differently: Take 20 mg by mouth daily. )  . hydrALAZINE (APRESOLINE) 25 MG tablet Take 1 tablet (25 mg total) by mouth 3 (three) times daily.  Marland Kitchen losartan-hydrochlorothiazide (HYZAAR) 100-12.5 MG tablet Take 1 tablet by mouth daily.  . metoprolol tartrate (LOPRESSOR) 25 MG tablet Take 1 tablet (25 mg total) by mouth 2 (two) times daily.  . mirtazapine (REMERON) 30 MG tablet daily. 1/2-1  . oxyCODONE (OXYCONTIN) 60 MG 12 hr tablet Take 1 tablet by mouth every 8 (eight) hours.  . potassium chloride (K-DUR) 10 MEQ tablet TAKE 1 TABLET (10 MEQ TOTAL) BY MOUTH DAILY. TAKE WITH LASIX  . rOPINIRole (REQUIP) 0.5 MG tablet Take 0.5 mg by mouth at bedtime as needed ("restlessness").     Cardiac Studies:   01/11/2015: Hospital Lower extremity arterial duplex Evidence of multilevel arterial occlusive disease bilaterally, left greater than right. Resting ankle-brachial index is mildly depressed on the right at 0.85. The left ABI is moderately depressed at rest at 0.65. On the right side, there likely is a component of aortoiliac inflow disease given monophasic waveforms throughout.  Echocardiogram 11/26/2018: Normal LV systolic  function with EF 57%. Left ventricle cavity is normal in size. Normal global wall motion. Doppler evidence of grade I (impaired) diastolic dysfunction, normal LAP. Calculated EF 57%. Left atrial cavity is mildly  dilated. Mild (Grade I) mitral regurgitation. Mild to moderate tricuspid regurgitation. Estimated pulmonary artery systolic pressure is 66-29 mmHg. IVC is dilated with blunted respiratory response. Estimated RA pressure 10-15 mmHg.  Lower Extremity Arterial Duplex 01/14/2019: There is monophasic waveform noted in the right external iliac and CFA suggests proximal significant disease. There is severe diffuse mixed plaque noted throughout the right lower extremity.  Moderate velocity increase at the left distal superficial femoral artery, >50% stenosis. There is severe diffuse mixed plaque throughout the left lower extremity This exam reveals moderately decreased perfusion of the right lower extremity, noted at the dorsalis pedis artery level (ABI 0.75) and moderately decreased perfusion of the left lower extremity, noted at the post tibial artery level (ABI 0.58).  No significant change from report of 01/11/2015.   Assessment   1.  Peripheral arterial disease involving bilateral lower extremity with severe symptoms of claudication, right thigh claudication worse than the left leg.  She has has moderate to severe decrease ABI in the left, duplex appeared to suggest right external iliac/common femoral artery high-grade stenosis and left SFA high-grade stenosis with diffuse severe plaque bilaterally. Both lower extremities are warm, no evidence of skin breakdown.  Recommendations:   I have again discussed with her regarding proceeding with angiography, right leg being more symptomatic but left leg ABI much more severe, I will scan her right femoral artery and if it is patent, I will access right groin and look at both right leg and left leg.  Otherwise I will have to access left femoral artery.  I have discussed with her regarding risk of renal failure.  She does have history of chronic kidney disease stage III-4 that has been stable.  Will need to closely monitor this with upcoming procedure.  We will  limit the amount of contrast use.  She may need staged procedure.  In regard to venous insufficiency, leg edema has been stable with use of support stockings and daily Lasix.  We will continue the same.  I will see her back after her procedure for follow-up.  Adrian Prows, MD, [facility] 03/18/2019, 11:19 AM [facility]. [place] Pager: [phone] Office: [phone] If no answer Cell [phone]

## 2019-03-18 NOTE — Discharge Instructions (Signed)
Femoral Site Care °This sheet gives you information about how to care for yourself after your procedure. Your health care provider may also give you more specific instructions. If you have problems or questions, contact your health care provider. °What can I expect after the procedure? °After the procedure, it is common to have: °· Bruising that usually fades within 1-2 weeks. °· Tenderness at the site. °Follow these instructions at home: °Wound care °· Follow instructions from your health care provider about how to take care of your insertion site. Make sure you: °? Wash your hands with soap and water before you change your bandage (dressing). If soap and water are not available, use hand sanitizer. °? Change your dressing as told by your health care provider. °? Leave stitches (sutures), skin glue, or adhesive strips in place. These skin closures may need to stay in place for 2 weeks or longer. If adhesive strip edges start to loosen and curl up, you may trim the loose edges. Do not remove adhesive strips completely unless your health care provider tells you to do that. °· Do not take baths, swim, or use a hot tub until your health care provider approves. °· You may shower 24-48 hours after the procedure or as told by your health care provider. °? Gently wash the site with plain soap and water. °? Pat the area dry with a clean towel. °? Do not rub the site. This may cause bleeding. °· Do not apply powder or lotion to the site. Keep the site clean and dry. °· Check your femoral site every day for signs of infection. Check for: °? Redness, swelling, or pain. °? Fluid or blood. °? Warmth. °? Pus or a bad smell. °Activity °· For the first 2-3 days after your procedure, or as long as directed: °? Avoid climbing stairs as much as possible. °? Do not squat. °· Do not lift anything that is heavier than 10 lb (4.5 kg), or the limit that you are told, until your health care provider says that it is safe. °· Rest as  directed. °? Avoid sitting for a long time without moving. Get up to take short walks every 1-2 hours. °· Do not drive for 24 hours if you were given a medicine to help you relax (sedative). °General instructions °· Take over-the-counter and prescription medicines only as told by your health care provider. °· Keep all follow-up visits as told by your health care provider. This is important. °Contact a health care provider if you have: °· A fever or chills. °· You have redness, swelling, or pain around your insertion site. °Get help right away if: °· The catheter insertion area swells very fast. °· You pass out. °· You suddenly start to sweat or your skin gets clammy. °· The catheter insertion area is bleeding, and the bleeding does not stop when you hold steady pressure on the area. °· The area near or just beyond the catheter insertion site becomes pale, cool, tingly, or numb. °These symptoms may represent a serious problem that is an emergency. Do not wait to see if the symptoms will go away. Get medical help right away. Call your local emergency services (911 in the [place] Do not drive yourself to the hospital. °Summary °· After the procedure, it is common to have bruising that usually fades within 1-2 weeks. °· Check your femoral site every day for signs of infection. °· Do not lift anything that is heavier than 10 lb (4.5 kg), or the   limit that you are told, until your health care provider says that it is safe. °This information is not intended to replace advice given to you by your health care provider. Make sure you discuss any questions you have with your health care provider. °Document Released: 01/09/2014 Document Revised: 05/21/2017 Document Reviewed: 05/21/2017 °Elsevier Patient Education © 2020 [employer] ° ° ° °Moderate Conscious Sedation, Adult, Care After °These instructions provide you with information about caring for yourself after your procedure. Your health care provider may also give you  more specific instructions. Your treatment has been planned according to current medical practices, but problems sometimes occur. Call your health care provider if you have any problems or questions after your procedure. °What can I expect after the procedure? °After your procedure, it is common: °· To feel sleepy for several hours. °· To feel clumsy and have poor balance for several hours. °· To have poor judgment for several hours. °· To vomit if you eat too soon. °Follow these instructions at home: °For at least 24 hours after the procedure: ° °· Do not: °? Participate in activities where you could fall or become injured. °? Drive. °? Use heavy machinery. °? Drink alcohol. °? Take sleeping pills or medicines that cause drowsiness. °? Make important decisions or sign legal documents. °? Take care of children on your own. °· Rest. °Eating and drinking °· Follow the diet recommended by your health care provider. °· If you vomit: °? Drink water, juice, or soup when you can drink without vomiting. °? Make sure you have little or no nausea before eating solid foods. °General instructions °· Have a responsible adult stay with you until you are awake and alert. °· Take over-the-counter and prescription medicines only as told by your health care provider. °· If you smoke, do not smoke without supervision. °· Keep all follow-up visits as told by your health care provider. This is important. °Contact a health care provider if: °· You keep feeling nauseous or you keep vomiting. °· You feel light-headed. °· You develop a rash. °· You have a fever. °Get help right away if: °· You have trouble breathing. °This information is not intended to replace advice given to you by your health care provider. Make sure you discuss any questions you have with your health care provider. °Document Released: 02/26/2013 Document Revised: 04/20/2017 Document Reviewed: 08/28/2015 °Elsevier Patient Education © 2020 [employer] ° °

## 2019-03-18 NOTE — Progress Notes (Signed)
Up and walked and tolerated well; right groin stable, no bleeding or hematoma 

## 2019-03-18 NOTE — Progress Notes (Signed)
I spoke with her friend Deloria Lair and discussed the findings of the catheterization and medical therapy.

## 2019-03-18 NOTE — Interval H&P Note (Signed)
History and Physical Interval Note:  03/18/2019 1:13 PM  [name]  has presented today for surgery, with the diagnosis of claudication - pad.  The various methods of treatment have been discussed with the patient and family. After consideration of risks, benefits and other options for treatment, the patient has consented to  Procedure(s): LOWER EXTREMITY ANGIOGRAPHY (N/A) and possible angioplasty as a surgical intervention.  The patient's history has been reviewed, patient examined, no change in status, stable for surgery.  I have reviewed the patient's chart and labs.  Questions were answered to the patient's satisfaction.     [name]

## 2019-03-19 ENCOUNTER — Encounter (HOSPITAL_COMMUNITY): Payer: Self-pay | Admitting: Cardiology

## 2019-03-20 ENCOUNTER — Other Ambulatory Visit: Payer: Self-pay | Admitting: Cardiology

## 2019-03-20 DIAGNOSIS — I10 Essential (primary) hypertension: Secondary | ICD-10-CM

## 2019-03-20 MED ORDER — LOSARTAN POTASSIUM-HCTZ 100-12.5 MG PO TABS: 1.0000 | ORAL_TABLET | Freq: Every day | ORAL | 3 refills | Status: DC

## 2019-03-21 ENCOUNTER — Other Ambulatory Visit: Payer: Self-pay

## 2019-03-21 DIAGNOSIS — I10 Essential (primary) hypertension: Secondary | ICD-10-CM

## 2019-03-21 MED ORDER — LOSARTAN POTASSIUM-HCTZ 100-12.5 MG PO TABS: 1.0000 | ORAL_TABLET | Freq: Every day | ORAL | 3 refills | Status: DC

## 2019-03-25 ENCOUNTER — Ambulatory Visit: Payer: Medicare Other | Admitting: Cardiology

## 2019-03-27 ENCOUNTER — Ambulatory Visit (INDEPENDENT_AMBULATORY_CARE_PROVIDER_SITE_OTHER): Payer: Medicare Other | Admitting: Cardiology

## 2019-03-27 ENCOUNTER — Other Ambulatory Visit: Payer: Self-pay

## 2019-03-27 ENCOUNTER — Encounter: Payer: Self-pay | Admitting: Cardiology

## 2019-03-27 VITALS — BP 134/55 | HR 59 | Ht 60.0 in | Wt 121.0 lb

## 2019-03-27 DIAGNOSIS — I872 Venous insufficiency (chronic) (peripheral): Secondary | ICD-10-CM

## 2019-03-27 DIAGNOSIS — I739 Peripheral vascular disease, unspecified: Secondary | ICD-10-CM | POA: Diagnosis not present

## 2019-03-27 DIAGNOSIS — I10 Essential (primary) hypertension: Secondary | ICD-10-CM | POA: Diagnosis not present

## 2019-03-27 NOTE — Progress Notes (Signed)
Primary Physician/Referring:  Maude Leriche, PA-C  Patient ID: [name], female    DOB: [birth year], 73 y.o.   MRN: [id number]  Chief complaints: Leg pain, bilateral, right thigh and right hip pain.  HPI: [name]  is a 73 y.o. female  with peripheral artery disease, hypertension, COPD,Stage III chronic kidney disease, history of breast cancer. She has had bilateral venous insufficiency and has had ablation in the past, former tobacco use since December 2019.  She is on chronic lasix due to leg edema.   She Underwent peripheral arteriogram on 03/18/2019 and left SFA had a long CTO for which she had failed attempt at angioplasty.  However best three-vessel runoff was noted below the knee and it is best felt that to leave the lesion alone.    Leg edema has remained stable with use of support stockings and daily Lasix.  She has been evaluated by vascular surgery and found to have venous insufficiency in bilateral lower extremities and recommended support stockings. No complications from the procedure.   Past Medical History:  Diagnosis Date  . Acute renal failure superimposed on stage 3 chronic kidney disease ([place]) 10/27/2014  . Back pain, chronic   . Breast cancer ([place]) 09/09/10  . Cancer of colon ([place]) 05/24/2011  . Centrilobular emphysema ([place]) 07/15/2018  . CKD (chronic kidney disease), stage III   . Claudication, intermittent ([place]) 07/15/2018  . Depression   . GIB (gastrointestinal bleeding)   . H/O tobacco use, presenting hazards to health Quit Dec 2019 05/03/2011   50 years, up to 3PPD quit last year.   Marland Kitchen Hx of varicose veins of lower extremity 07/15/2018  . Hypertension   . SDH (subdural hematoma) ([place])   . SOB (shortness of breath) 07/15/2018    Past Surgical History:  Procedure Laterality Date  . ABDOMINAL AORTOGRAM W/LOWER EXTREMITY Left 03/18/2019   Procedure: ABDOMINAL AORTOGRAM W/LOWER EXTREMITY;  Surgeon: Adrian Prows, MD;  Location: [facility];   Service: Cardiovascular;  Laterality: Left;  . BACK SURGERY    . BREAST LUMPECTOMY  10/10/2010   Rt Breast  . COLON SURGERY  04/2011  . COLONOSCOPY  05/03/2011   Procedure: COLONOSCOPY;  Surgeon: Jeryl Columbia, MD;  Location: [facility];  Service: Endoscopy;  Laterality: N/A;  . JOINT REPLACEMENT     R&L total shoulder replacements  . LUMBAR DISC SURGERY    . PARTIAL HYSTERECTOMY    . PERIPHERAL VASCULAR INTERVENTION  03/18/2019   Procedure: PERIPHERAL VASCULAR INTERVENTION;  Surgeon: Adrian Prows, MD;  Location: [facility];  Service: Cardiovascular;;  attempted left SFA  . SHOULDER SURGERY    . TUMOR REMOVAL  Tumor removed R breast    Social History   Socioeconomic History  . Marital status: Divorced    Spouse name: Not on file  . Number of children: 2  . Years of education: Not on file  . Highest education level: Not on file  Occupational History  . Not on file  Social Needs  . Financial resource strain: Not on file  . Food insecurity    Worry: Not on file    Inability: Not on file  . Transportation needs    Medical: Not on file    Non-medical: Not on file  Tobacco Use  . Smoking status: Former Smoker    Packs/day: 0.75    Years: 30.00    Pack years: 22.50    Types: Cigarettes    Quit date: 2020  Years since quitting: 0.8  . Smokeless tobacco: Former Systems developer    Quit date: 04/22/2010  Substance and Sexual Activity  . Alcohol use: Yes    Comment: rarely   . Drug use: No  . Sexual activity: Not Currently  Lifestyle  . Physical activity    Days per week: Not on file    Minutes per session: Not on file  . Stress: Not on file  Relationships  . Social Herbalist on phone: Not on file    Gets together: Not on file    Attends religious service: Not on file    Active member of club or organization: Not on file    Attends meetings of clubs or organizations: Not on file    Relationship status: Not on file  . Intimate partner violence    Fear of  current or ex partner: Not on file    Emotionally abused: Not on file    Physically abused: Not on file    Forced sexual activity: Not on file  Other Topics Concern  . Not on file  Social History Narrative   Lives alone in a one story home.  Has 2 children.     On disability for low back pain since the age of 46.     Education: high school.    Review of Systems  Constitution: Negative for decreased appetite, malaise/fatigue, weight gain and weight loss.  Eyes: Negative for visual disturbance.  Cardiovascular: Positive for claudication, dyspnea on exertion and leg swelling. Negative for chest pain, orthopnea, palpitations and syncope.  Respiratory: Negative for hemoptysis and wheezing.   Endocrine: Negative for cold intolerance and heat intolerance.  Hematologic/Lymphatic: Does not bruise/bleed easily.  Skin: Negative for nail changes and poor wound healing.  Musculoskeletal: Positive for arthritis and back pain. Negative for muscle weakness and myalgias.  Gastrointestinal: Negative for abdominal pain, change in bowel habit, nausea and vomiting.  Neurological: Negative for difficulty with concentration, dizziness, focal weakness and headaches.  Psychiatric/Behavioral: Negative for altered mental status and suicidal ideas.  All other systems reviewed and are negative.     Objective  Blood pressure (!) 134/55, pulse (!) 59, height 5' (1.524 m), weight 121 lb (54.9 kg), SpO2 92 %. Body mass index is 23.63 kg/m.    Physical Exam  Constitutional: She is oriented to person, place, and time. Vital signs are normal. She appears well-developed and well-nourished.  HENT:  Head: Normocephalic and atraumatic.  Neck: Normal range of motion.  Cardiovascular: Normal rate, regular rhythm, normal heart sounds and intact distal pulses.  Pulses:      Femoral pulses are 2+ on the right side.      Popliteal pulses are 1+ on the right side and 1+ on the left side.       Dorsalis pedis pulses are 1+  on the right side and 0 on the left side.       Posterior tibial pulses are 1+ on the right side and 0 on the left side.  Mild Erythema bilateral R>L. Left medial malleolus shows a small scar like lesion that does not appear ischemia. No discharge.  No leg edema  Pulmonary/Chest: Effort normal and breath sounds normal. No accessory muscle usage. No respiratory distress.  Abdominal: Soft. Bowel sounds are normal.  Musculoskeletal: Normal range of motion.  Neurological: She is alert and oriented to person, place, and time.  Skin: Skin is warm and dry. No erythema.  Vitals reviewed.  Radiology: No results  found.  Laboratory examination:    CMP Latest Ref Rng & Units 03/07/2019 12/09/2018 10/28/2018  Glucose 65 - 99 mg/dL 88 84 80  BUN 8 - 27 mg/dL 49(H) 32(H) 46(H)  Creatinine 0.57 - 1.00 mg/dL 1.73(H) 1.36(H) 1.74(H)  Sodium 134 - 144 mmol/L 142 143 144  Potassium 3.5 - 5.2 mmol/L 4.3 5.2 4.2  Chloride 96 - 106 mmol/L 104 101 100  CO2 20 - 29 mmol/L 25 27 28   Calcium 8.7 - 10.3 mg/dL 10.5(H) 10.8(H) 10.4(H)  Total Protein 6.0 - 8.5 g/dL - - -  Total Bilirubin 0.0 - 1.2 mg/dL - - -  Alkaline Phos 39 - 117 IU/L - - -  AST 0 - 40 IU/L - - -  ALT 0 - 32 IU/L - - -   CBC Latest Ref Rng & Units 03/07/2019 02/22/2016 01/25/2016  WBC 3.4 - 10.8 x10E3/uL 5.6 5.5 5.2  Hemoglobin 11.1 - 15.9 g/dL 14.0 13.3 13.5  Hematocrit 34.0 - 46.6 % 41.2 39.0 39.2  Platelets 150 - 450 x10E3/uL 225 203 226   Lipid Panel     Component Value Date/Time   CHOL 151 09/03/2018 1426   TRIG 170 (H) 09/03/2018 1426   HDL 76 09/03/2018 1426   CHOLHDL 2.0 09/03/2018 1426   LDLCALC 41 09/03/2018 1426   HEMOGLOBIN A1C Lab Results  Component Value Date   HGBA1C 6.1 (H) 10/28/2014   MPG 128 10/28/2014   TSH No results for input(s): TSH in the last 8760 hours.  PRN Meds:. Medications Discontinued During This Encounter  Medication Reason  . furosemide (LASIX) 20 MG tablet Error   Current Meds  Medication  Sig  . ALPRAZolam (XANAX) 1 MG tablet as needed.   Marland Kitchen amLODipine (NORVASC) 5 MG tablet Take 1 tablet (5 mg total) by mouth daily.  Marland Kitchen aspirin EC 81 MG tablet Take 81 mg by mouth daily.  Marland Kitchen atorvastatin (LIPITOR) 10 MG tablet Take 1 tablet (10 mg total) by mouth at bedtime.  Marland Kitchen buPROPion (WELLBUTRIN XL) 150 MG 24 hr tablet Take 150 mg by mouth daily.  . clopidogrel (PLAVIX) 75 MG tablet Take 1 tablet (75 mg total) by mouth daily.  . furosemide (LASIX) 20 MG tablet Take 20 mg by mouth daily.  . hydrALAZINE (APRESOLINE) 25 MG tablet Take 1 tablet (25 mg total) by mouth 3 (three) times daily.  Marland Kitchen losartan-hydrochlorothiazide (HYZAAR) 100-12.5 MG tablet Take 1 tablet by mouth daily.  . metoprolol tartrate (LOPRESSOR) 25 MG tablet Take 1 tablet (25 mg total) by mouth 2 (two) times daily.  . mirtazapine (REMERON) 30 MG tablet 30 mg daily.   Marland Kitchen oxyCODONE (OXYCONTIN) 60 MG 12 hr tablet Take 1 tablet by mouth every 8 (eight) hours.  . potassium chloride (K-DUR) 10 MEQ tablet TAKE 1 TABLET (10 MEQ TOTAL) BY MOUTH DAILY. TAKE WITH LASIX  . rOPINIRole (REQUIP) 0.5 MG tablet Take 0.5 mg by mouth at bedtime as needed ("restlessness").   . [DISCONTINUED] furosemide (LASIX) 20 MG tablet Take 1 tablet (20 mg total) by mouth daily. As directed. Take 1 tablet Am and 1 tablet 3 PM for the next 2 days    Cardiac Studies:   01/11/2015: Hospital Lower extremity arterial duplex Evidence of multilevel arterial occlusive disease bilaterally, left greater than right. Resting ankle-brachial index is mildly depressed on the right at 0.85. The left ABI is moderately depressed at rest at 0.65. On the right side, there likely is a component of aortoiliac inflow disease given monophasic waveforms  throughout.  Echocardiogram 11/26/2018: Normal LV systolic function with EF 57%. Left ventricle cavity is normal in size. Normal global wall motion. Doppler evidence of grade I (impaired) diastolic dysfunction, normal LAP. Calculated EF  57%. Left atrial cavity is mildly dilated. Mild (Grade I) mitral regurgitation. Mild to moderate tricuspid regurgitation. Estimated pulmonary artery systolic pressure is 75-88 mmHg. IVC is dilated with blunted respiratory response. Estimated RA pressure 10-15 mmHg.  Lower Extremity Arterial Duplex 01/14/2019: There is monophasic waveform noted in the right external iliac and CFA suggests proximal significant disease. There is severe diffuse mixed plaque noted throughout the right lower extremity.  Moderate velocity increase at the left distal superficial femoral artery, >50% stenosis. There is severe diffuse mixed plaque throughout the left lower extremity This exam reveals moderately decreased perfusion of the right lower extremity, noted at the dorsalis pedis artery level (ABI 0.75) and moderately decreased perfusion of the left lower extremity, noted at the post tibial artery level (ABI 0.58).  No significant change from report of 01/11/2015.   Peripheral arteriogram 03/18/2019:  Dist R EIA to Prox R CFA lesion is 60% stenosed.  Prox R CIA to Mid R EIA lesion is 20% stenosed.  Prox L CIA to Mid L CFA lesion is 20% stenosed.  Prox L SFA to Dist L SFA lesion is 100% stenosed.  Three-vessel runoff below the knee on the left with brisk  flow.  Failed attempt at angioplasty of the left SFA.   Suprarenal Abd AO to Infrarenal Abd AO lesion is 30% stenosed. Severely calcified lesions.  Abdominal aortogram also reveals patent renal arteries 1 on either side. There is no evidence of abdominal aortic aneurysm.   Assessment     ICD-10-CM   1. PAD (peripheral artery disease) (HCC)  I73.9   2. Essential hypertension  I10   3. Venous insufficiency of both lower extremities  I87.2     EKG 02/10/2019: Sinus bradycardia at 55 bpm, normal axis, RBBB. No changes from EKG in Feb 2020.  Recommendations:   Patient underwent peripheral arteriogram on 03/18/2019, she has excellent bilateral lower  extremity runoff in spite of SFA disease.  Although ABI is abnormal, risk of limb loss is very minimal. Continue ASA and plavix for now.   I discussed with the patient regarding limb loss versus symptoms of claudication, the fact that she has significant collateralization of her CTO will protect her from he'll I.  Advised her to continue present medical therapy, continue aspirin and Plavix, if she indeed develops worsening symptoms of claudication or see a light, retrograde axis IV revascularization is still a very good option.  Blood pressure is well controlled, bilateral lower extremity edema is essentially resolved and advised her to use furosemide only on a p.r.n. basis.  She does have bilateral mild venous insufficiency.  I would like to see her back in 6 months.  Adrian Prows, MD, [facility] 03/27/2019, 1:14 PM [facility] [place] Pager: [phone] Office: [phone] If no answer Cell [phone]

## 2019-03-31 ENCOUNTER — Telehealth: Payer: Self-pay

## 2019-03-31 NOTE — Telephone Encounter (Signed)
Pt called back again stating that she has a raised area that is very painful where the procedure was done that was not there at her ov on 11/5 with JG. Please advise.//ah

## 2019-03-31 NOTE — Telephone Encounter (Signed)
Pt called asking if you could get her a call d that she has questions about her legs.

## 2019-04-01 NOTE — Telephone Encounter (Signed)
Pt made an appt for tomorrow

## 2019-04-02 ENCOUNTER — Other Ambulatory Visit: Payer: Self-pay

## 2019-04-02 ENCOUNTER — Encounter: Payer: Self-pay | Admitting: Cardiology

## 2019-04-02 ENCOUNTER — Ambulatory Visit (INDEPENDENT_AMBULATORY_CARE_PROVIDER_SITE_OTHER): Payer: Medicare Other | Admitting: Cardiology

## 2019-04-02 VITALS — BP 171/84 | HR 69 | Temp 98.0°F | Ht 60.0 in | Wt 122.5 lb

## 2019-04-02 DIAGNOSIS — I739 Peripheral vascular disease, unspecified: Secondary | ICD-10-CM | POA: Diagnosis not present

## 2019-04-02 NOTE — Progress Notes (Signed)
Primary Physician/Referring:  Maude Leriche, PA-C  Patient ID: [name] S Sliger, female    DOB: Sep 24, 1945, 73 y.o.   MRN: [id number]  Chief complaints: Leg pain, bilateral, right thigh and right hip pain.  HPI: [name] S Gill  is a 73 y.o. female  with peripheral artery disease, hypertension, COPD,Stage III chronic kidney disease, history of breast cancer. She has had bilateral venous insufficiency and has had ablation in the past, former tobacco use since December 2019.    She Underwent peripheral arteriogram on 03/18/2019 and left SFA had a long CTO for which she had failed attempt at angioplasty.  However best three-vessel runoff was noted below the knee and it is best felt that to leave the lesion alone. Last seen on 11/05 for post-procedure follow up. She presents today for acute visit for swelling at her groin access site.  A day or two after her appointment, she noticed small area of swelling at her groin site that is mildly tender. She does have some bruising to her right thigh that is also tender.     Past Medical History:  Diagnosis Date  . Acute renal failure superimposed on stage 3 chronic kidney disease ([place]) 10/27/2014  . Back pain, chronic   . Breast cancer ([place]) 09/09/10  . Cancer of colon ([place]) 05/24/2011  . Centrilobular emphysema ([place]) 07/15/2018  . CKD (chronic kidney disease), stage III   . Claudication, intermittent ([place]) 07/15/2018  . Depression   . GIB (gastrointestinal bleeding)   . H/O tobacco use, presenting hazards to health Quit Dec 2019 05/03/2011   50 years, up to 3PPD quit last year.   Marland Kitchen Hx of varicose veins of lower extremity 07/15/2018  . Hypertension   . SDH (subdural hematoma) ([place])   . SOB (shortness of breath) 07/15/2018    Past Surgical History:  Procedure Laterality Date  . ABDOMINAL AORTOGRAM W/LOWER EXTREMITY Left 03/18/2019   Procedure: ABDOMINAL AORTOGRAM W/LOWER EXTREMITY;  Surgeon: Adrian Prows, MD;  Location: [facility];   Service: Cardiovascular;  Laterality: Left;  . BACK SURGERY    . BREAST LUMPECTOMY  10/10/2010   Rt Breast  . COLON SURGERY  04/2011  . COLONOSCOPY  05/03/2011   Procedure: COLONOSCOPY;  Surgeon: Jeryl Columbia, MD;  Location: [facility];  Service: Endoscopy;  Laterality: N/A;  . JOINT REPLACEMENT     R&L total shoulder replacements  . LUMBAR DISC SURGERY    . PARTIAL HYSTERECTOMY    . PERIPHERAL VASCULAR INTERVENTION  03/18/2019   Procedure: PERIPHERAL VASCULAR INTERVENTION;  Surgeon: Adrian Prows, MD;  Location: [facility];  Service: Cardiovascular;;  attempted left SFA  . SHOULDER SURGERY    . TUMOR REMOVAL  Tumor removed R breast    Social History   Socioeconomic History  . Marital status: Divorced    Spouse name: Not on file  . Number of children: 2  . Years of education: Not on file  . Highest education level: Not on file  Occupational History  . Not on file  Social Needs  . Financial resource strain: Not on file  . Food insecurity    Worry: Not on file    Inability: Not on file  . Transportation needs    Medical: Not on file    Non-medical: Not on file  Tobacco Use  . Smoking status: Former Smoker    Packs/day: 0.75    Years: 30.00    Pack years: 22.50    Types: Cigarettes  Quit date: 2020    Years since quitting: 0.8  . Smokeless tobacco: Former Systems developer    Quit date: 04/22/2010  Substance and Sexual Activity  . Alcohol use: Yes    Comment: rarely   . Drug use: No  . Sexual activity: Not Currently  Lifestyle  . Physical activity    Days per week: Not on file    Minutes per session: Not on file  . Stress: Not on file  Relationships  . Social Herbalist on phone: Not on file    Gets together: Not on file    Attends religious service: Not on file    Active member of club or organization: Not on file    Attends meetings of clubs or organizations: Not on file    Relationship status: Not on file  . Intimate partner violence    Fear of  current or ex partner: Not on file    Emotionally abused: Not on file    Physically abused: Not on file    Forced sexual activity: Not on file  Other Topics Concern  . Not on file  Social History Narrative   Lives alone in a one story home.  Has 2 children.     On disability for low back pain since the age of 35.     Education: high school.    Review of Systems  Constitution: Negative for decreased appetite, malaise/fatigue, weight gain and weight loss.  Eyes: Negative for visual disturbance.  Cardiovascular: Positive for claudication, dyspnea on exertion and leg swelling. Negative for chest pain, orthopnea, palpitations and syncope.  Respiratory: Negative for hemoptysis and wheezing.   Endocrine: Negative for cold intolerance and heat intolerance.  Hematologic/Lymphatic: Does not bruise/bleed easily.  Skin: Negative for nail changes and poor wound healing.  Musculoskeletal: Positive for arthritis and back pain. Negative for muscle weakness and myalgias.  Gastrointestinal: Negative for abdominal pain, change in bowel habit, nausea and vomiting.  Neurological: Negative for difficulty with concentration, dizziness, focal weakness and headaches.  Psychiatric/Behavioral: Negative for altered mental status and suicidal ideas.  All other systems reviewed and are negative.     Objective  Blood pressure (!) 171/84, pulse 69, temperature 98 F (36.7 C), height 5' (1.524 m), weight 122 lb 8 oz (55.6 kg), SpO2 91 %. Body mass index is 23.92 kg/m.    Physical Exam  Constitutional: She is oriented to person, place, and time. Vital signs are normal. She appears well-developed and well-nourished.  HENT:  Head: Normocephalic and atraumatic.  Neck: Normal range of motion.  Cardiovascular: Normal rate, regular rhythm, normal heart sounds and intact distal pulses.  Pulses:      Femoral pulses are 2+ on the right side with bruit.      Popliteal pulses are 1+ on the right side and 1+ on the left  side.       Dorsalis pedis pulses are 1+ on the right side and 0 on the left side.       Posterior tibial pulses are 1+ on the right side and 0 on the left side.  Mild Erythema bilateral R>L. Left medial malleolus shows a small scar like lesion that does not appear ischemia. No discharge.  No leg edema  Pulmonary/Chest: Effort normal and breath sounds normal. No accessory muscle usage. No respiratory distress.  Abdominal: Soft. Bowel sounds are normal.  Musculoskeletal: Normal range of motion.  Neurological: She is alert and oriented to person, place, and time.  Skin:  Skin is warm and dry. No erythema.  Vitals reviewed.  Radiology: No results found.  Laboratory examination:    CMP Latest Ref Rng & Units 03/07/2019 12/09/2018 10/28/2018  Glucose 65 - 99 mg/dL 88 84 80  BUN 8 - 27 mg/dL 49(H) 32(H) 46(H)  Creatinine 0.57 - 1.00 mg/dL 1.73(H) 1.36(H) 1.74(H)  Sodium 134 - 144 mmol/L 142 143 144  Potassium 3.5 - 5.2 mmol/L 4.3 5.2 4.2  Chloride 96 - 106 mmol/L 104 101 100  CO2 20 - 29 mmol/L 25 27 28   Calcium 8.7 - 10.3 mg/dL 10.5(H) 10.8(H) 10.4(H)  Total Protein 6.0 - 8.5 g/dL - - -  Total Bilirubin 0.0 - 1.2 mg/dL - - -  Alkaline Phos 39 - 117 IU/L - - -  AST 0 - 40 IU/L - - -  ALT 0 - 32 IU/L - - -   CBC Latest Ref Rng & Units 03/07/2019 02/22/2016 01/25/2016  WBC 3.4 - 10.8 x10E3/uL 5.6 5.5 5.2  Hemoglobin 11.1 - 15.9 g/dL 14.0 13.3 13.5  Hematocrit 34.0 - 46.6 % 41.2 39.0 39.2  Platelets 150 - 450 x10E3/uL 225 203 226   Lipid Panel     Component Value Date/Time   CHOL 151 09/03/2018 1426   TRIG 170 (H) 09/03/2018 1426   HDL 76 09/03/2018 1426   CHOLHDL 2.0 09/03/2018 1426   LDLCALC 41 09/03/2018 1426   HEMOGLOBIN A1C Lab Results  Component Value Date   HGBA1C 6.1 (H) 10/28/2014   MPG 128 10/28/2014   TSH No results for input(s): TSH in the last 8760 hours.  PRN Meds:. There are no discontinued medications. Current Meds  Medication Sig  . ALPRAZolam (XANAX) 1  MG tablet as needed.   Marland Kitchen amLODipine (NORVASC) 5 MG tablet Take 1 tablet (5 mg total) by mouth daily.  Marland Kitchen aspirin EC 81 MG tablet Take 81 mg by mouth daily.  Marland Kitchen atorvastatin (LIPITOR) 10 MG tablet Take 1 tablet (10 mg total) by mouth at bedtime.  Marland Kitchen buPROPion (WELLBUTRIN XL) 150 MG 24 hr tablet Take 150 mg by mouth daily.  . clopidogrel (PLAVIX) 75 MG tablet Take 1 tablet (75 mg total) by mouth daily. (Patient taking differently: Take 75 mg by mouth daily. With food)  . furosemide (LASIX) 20 MG tablet Take 20 mg by mouth daily as needed for edema.   . gabapentin (NEURONTIN) 100 MG capsule Take 100 mg by mouth 3 (three) times daily.  . hydrALAZINE (APRESOLINE) 25 MG tablet Take 1 tablet (25 mg total) by mouth 3 (three) times daily.  Marland Kitchen losartan-hydrochlorothiazide (HYZAAR) 100-12.5 MG tablet Take 1 tablet by mouth daily.  . metoprolol tartrate (LOPRESSOR) 25 MG tablet Take 1 tablet (25 mg total) by mouth 2 (two) times daily.  . mirtazapine (REMERON) 30 MG tablet 30 mg daily.   Marland Kitchen oxyCODONE (OXYCONTIN) 60 MG 12 hr tablet Take 1 tablet by mouth every 8 (eight) hours.  . potassium chloride (K-DUR) 10 MEQ tablet TAKE 1 TABLET (10 MEQ TOTAL) BY MOUTH DAILY. TAKE WITH LASIX (Patient taking differently: Take 10 mEq by mouth daily as needed. Take with lasix)  . rOPINIRole (REQUIP) 0.5 MG tablet Take 0.5 mg by mouth at bedtime as needed ("restlessness").     Cardiac Studies:   01/11/2015: Hospital Lower extremity arterial duplex Evidence of multilevel arterial occlusive disease bilaterally, left greater than right. Resting ankle-brachial index is mildly depressed on the right at 0.85. The left ABI is moderately depressed at rest at 0.65. On the  right side, there likely is a component of aortoiliac inflow disease given monophasic waveforms throughout.  Echocardiogram 11/26/2018: Normal LV systolic function with EF 57%. Left ventricle cavity is normal in size. Normal global wall motion. Doppler evidence of grade  I (impaired) diastolic dysfunction, normal LAP. Calculated EF 57%. Left atrial cavity is mildly dilated. Mild (Grade I) mitral regurgitation. Mild to moderate tricuspid regurgitation. Estimated pulmonary artery systolic pressure is 40-81 mmHg. IVC is dilated with blunted respiratory response. Estimated RA pressure 10-15 mmHg.  Lower Extremity Arterial Duplex 01/14/2019: There is monophasic waveform noted in the right external iliac and CFA suggests proximal significant disease. There is severe diffuse mixed plaque noted throughout the right lower extremity.  Moderate velocity increase at the left distal superficial femoral artery, >50% stenosis. There is severe diffuse mixed plaque throughout the left lower extremity This exam reveals moderately decreased perfusion of the right lower extremity, noted at the dorsalis pedis artery level (ABI 0.75) and moderately decreased perfusion of the left lower extremity, noted at the post tibial artery level (ABI 0.58).  No significant change from report of 01/11/2015.   Peripheral arteriogram 03/18/2019:  Dist R EIA to Prox R CFA lesion is 60% stenosed.  Prox R CIA to Mid R EIA lesion is 20% stenosed.  Prox L CIA to Mid L CFA lesion is 20% stenosed.  Prox L SFA to Dist L SFA lesion is 100% stenosed.  Three-vessel runoff below the knee on the left with brisk  flow.  Failed attempt at angioplasty of the left SFA.   Suprarenal Abd AO to Infrarenal Abd AO lesion is 30% stenosed. Severely calcified lesions.  Abdominal aortogram also reveals patent renal arteries 1 on either side. There is no evidence of abdominal aortic aneurysm.   Assessment     ICD-10-CM   1. PAD (peripheral artery disease) (HCC)  I73.9     EKG 02/10/2019: Sinus bradycardia at 55 bpm, normal axis, RBBB. No changes from EKG in Feb 2020.  Recommendations:   Suspect her right groin swelling is related to closure device.  It should resolve in the next 2 days.  Do not suspect  hematoma or pseudoaneurysm.  She only has very mild tenderness to the area.  Encouraged her to continue to monitor this and notify sooner any changes.  Otherwise her next site is doing well with ecchymosis on her right thigh that we will to need to improve over the next weeks.  Continue present medications.  We will see her back as planned in 6 months.  Miquel Dunn, MSN, APRN, FNP-C [facility]. [place] Office: [phone] Fax: [phone]

## 2019-04-03 ENCOUNTER — Telehealth: Payer: Self-pay

## 2019-04-03 NOTE — Telephone Encounter (Signed)
done

## 2019-04-03 NOTE — Telephone Encounter (Signed)
Pt wants to know if you can call her she has a few more questions regarding her leg procedure [phone]

## 2019-04-03 NOTE — Telephone Encounter (Signed)
Let her know it will probably be tomorrow before i can call, because of my schedule today. But I will

## 2019-04-09 ENCOUNTER — Other Ambulatory Visit: Payer: Self-pay | Admitting: Cardiology

## 2019-04-23 ENCOUNTER — Telehealth: Payer: Self-pay

## 2019-04-23 NOTE — Telephone Encounter (Signed)
Pt wants to know if she can wear compression socks she is having bruising and per Lavone Nian says its okay

## 2019-05-08 ENCOUNTER — Encounter (HOSPITAL_COMMUNITY): Payer: Self-pay | Admitting: Cardiology

## 2019-05-13 ENCOUNTER — Other Ambulatory Visit: Payer: Self-pay

## 2019-05-13 DIAGNOSIS — I739 Peripheral vascular disease, unspecified: Secondary | ICD-10-CM

## 2019-05-13 MED ORDER — CLOPIDOGREL BISULFATE 75 MG PO TABS: 75.0000 mg | ORAL_TABLET | Freq: Every day | ORAL | 0 refills | Status: DC

## 2019-06-03 ENCOUNTER — Other Ambulatory Visit: Payer: Self-pay | Admitting: Cardiology

## 2019-06-03 DIAGNOSIS — I739 Peripheral vascular disease, unspecified: Secondary | ICD-10-CM

## 2019-06-04 ENCOUNTER — Telehealth: Payer: Self-pay

## 2019-06-04 NOTE — Telephone Encounter (Signed)
She has venous engorgement and varicose veins probably.  Pressure dressing.  Discontinue Plavix and continue ASA alone. If no improvement has to be seen in urgent care or ED.

## 2019-06-04 NOTE — Addendum Note (Signed)
Addended by: Kela Millin on: 06/04/2019 04:34 PM   Modules accepted: Orders

## 2019-06-04 NOTE — Telephone Encounter (Signed)
See above, I know you took care of it

## 2019-06-04 NOTE — Telephone Encounter (Signed)
Pt daughter called and she said her leg started bleeding and she has it elevated now and wants to know what she should do? Should she be concerned

## 2019-06-05 NOTE — Telephone Encounter (Signed)
Pt aware.

## 2019-06-10 ENCOUNTER — Telehealth: Payer: Self-pay

## 2019-06-10 NOTE — Telephone Encounter (Signed)
Pt called back stating that she is lightheaded and dizzy since she lost some blood from last week; the blood stopped 06/04/2019; she is concerned, she also wants to know should both her legs feel weak ? Her R ankle is very very dry and itches and she says that its starting to be how the left one is. She is not on her plavix still since [place] told her to stop it

## 2019-06-10 NOTE — Telephone Encounter (Signed)
What blood tests are you going to order so I can tell her

## 2019-06-10 NOTE — Telephone Encounter (Signed)
Not sure the etiology of all of her symptoms. We can certainly check some labs to see what her blood count looks like. Would keep an eye on her blood pressure. Drink some pedialyte or fluids to help if her blood pressure is on the lower side.

## 2019-06-11 ENCOUNTER — Telehealth: Payer: Self-pay

## 2019-06-11 DIAGNOSIS — R5383 Other fatigue: Secondary | ICD-10-CM

## 2019-06-11 DIAGNOSIS — I739 Peripheral vascular disease, unspecified: Secondary | ICD-10-CM

## 2019-06-11 DIAGNOSIS — I10 Essential (primary) hypertension: Secondary | ICD-10-CM

## 2019-06-11 NOTE — Telephone Encounter (Signed)
You can go ahead and put that order in for cbc she will go tomorrow

## 2019-06-13 LAB — CBC
Hematocrit: 32 % — ABNORMAL LOW (ref 34.0–46.6)
Hemoglobin: 10.6 g/dL — ABNORMAL LOW (ref 11.1–15.9)
MCH: 30 pg (ref 26.6–33.0)
MCHC: 33.1 g/dL (ref 31.5–35.7)
MCV: 91 fL (ref 79–97)
Platelets: 208 10*3/uL (ref 150–450)
RBC: 3.53 x10E6/uL — ABNORMAL LOW (ref 3.77–5.28)
RDW: 12.4 % (ref 11.7–15.4)
WBC: 4.9 10*3/uL (ref 3.4–10.8)

## 2019-06-13 LAB — COMPREHENSIVE METABOLIC PANEL
ALT: 14 IU/L (ref 0–32)
AST: 21 IU/L (ref 0–40)
Albumin/Globulin Ratio: 2.4 — ABNORMAL HIGH (ref 1.2–2.2)
Albumin: 4.3 g/dL (ref 3.7–4.7)
Alkaline Phosphatase: 98 IU/L (ref 39–117)
BUN/Creatinine Ratio: 20 (ref 12–28)
BUN: 24 mg/dL (ref 8–27)
Bilirubin Total: 0.2 mg/dL (ref 0.0–1.2)
CO2: 25 mmol/L (ref 20–29)
Calcium: 10.3 mg/dL (ref 8.7–10.3)
Chloride: 108 mmol/L — ABNORMAL HIGH (ref 96–106)
Creatinine, Ser: 1.23 mg/dL — ABNORMAL HIGH (ref 0.57–1.00)
GFR calc Af Amer: 51 mL/min/{1.73_m2} — ABNORMAL LOW (ref >59)
GFR calc non Af Amer: 44 mL/min/{1.73_m2} — ABNORMAL LOW (ref >59)
Globulin, Total: 1.8 g/dL (ref 1.5–4.5)
Glucose: 90 mg/dL (ref 65–99)
Potassium: 4.7 mmol/L (ref 3.5–5.2)
Sodium: 144 mmol/L (ref 134–144)
Total Protein: 6.1 g/dL (ref 6.0–8.5)

## 2019-06-13 LAB — LIPID PANEL
Chol/HDL Ratio: 1.8 ratio (ref 0.0–4.4)
Cholesterol, Total: 132 mg/dL (ref 100–199)
HDL: 73 mg/dL (ref >39)
LDL Chol Calc (NIH): 46 mg/dL (ref 0–99)
Triglycerides: 62 mg/dL (ref 0–149)
VLDL Cholesterol Cal: 13 mg/dL (ref 5–40)

## 2019-06-16 ENCOUNTER — Encounter: Payer: Self-pay | Admitting: Cardiology

## 2019-06-16 ENCOUNTER — Other Ambulatory Visit: Payer: Self-pay

## 2019-06-16 ENCOUNTER — Ambulatory Visit (INDEPENDENT_AMBULATORY_CARE_PROVIDER_SITE_OTHER): Payer: Medicare Other | Admitting: Cardiology

## 2019-06-16 VITALS — BP 105/41 | HR 44 | Temp 97.9°F | Ht 60.0 in | Wt 131.0 lb

## 2019-06-16 DIAGNOSIS — I83892 Varicose veins of left lower extremities with other complications: Secondary | ICD-10-CM | POA: Diagnosis not present

## 2019-06-16 DIAGNOSIS — R5383 Other fatigue: Secondary | ICD-10-CM | POA: Diagnosis not present

## 2019-06-16 DIAGNOSIS — R001 Bradycardia, unspecified: Secondary | ICD-10-CM | POA: Diagnosis not present

## 2019-06-16 NOTE — Progress Notes (Signed)
Primary Physician/Referring:  Maude Leriche, PA-C  Patient ID: [name], female    DOB: 1946-03-23, 74 y.o.   MRN: [id number]  Chief complaints: fatigue and dizziness  HPI: [name]  is a 74 y.o. female  with peripheral artery disease, hypertension, COPD,Stage III chronic kidney disease, history of breast cancer. She has had bilateral venous insufficiency and has had ablation in the past, former tobacco use since December 2019.    She Underwent peripheral arteriogram on 03/18/2019 and left SFA had a long CTO for which she had failed attempt at angioplasty.  However best three-vessel runoff was noted below the knee and it is best felt that to leave the lesion alone. Last seen in Nov 2020. She recently called the office after having some bleeding from a wound on her left ankle. States she bled for 2 hours before she was able to get it to stop. Plavix was then advised to stop. She made an appointment to see me today due to fatigue, weakness, and dizziness. Her friend, Anderson Malta, is present at bedside.   She has been sleeping a lot sine her and has no energy. She is also dizzy with standing up. She has had several falls due to falling asleep or getting dizzy. Patient does not believe that she has actually lost consciousness. She has not had recurrence of bleeding. She is not disoriented or have any focal weakness.    Past Medical History:  Diagnosis Date  . Acute renal failure superimposed on stage 3 chronic kidney disease ([place]) 10/27/2014  . Back pain, chronic   . Breast cancer ([place]) 09/09/10  . Cancer of colon ([place]) 05/24/2011  . Centrilobular emphysema ([place]) 07/15/2018  . CKD (chronic kidney disease), stage III   . Claudication, intermittent ([place]) 07/15/2018  . Depression   . GIB (gastrointestinal bleeding)   . H/O tobacco use, presenting hazards to health Quit Dec 2019 05/03/2011   50 years, up to 3PPD quit last year.   Marland Kitchen Hx of varicose veins of lower extremity 07/15/2018  .  Hypertension   . SDH (subdural hematoma) ([place])   . SOB (shortness of breath) 07/15/2018    Past Surgical History:  Procedure Laterality Date  . ABDOMINAL AORTOGRAM W/LOWER EXTREMITY Left 03/18/2019   Procedure: ABDOMINAL AORTOGRAM W/LOWER EXTREMITY;  Surgeon: Adrian Prows, MD;  Location: [facility];  Service: Cardiovascular;  Laterality: Left;  . BACK SURGERY    . BREAST LUMPECTOMY  10/10/2010   Rt Breast  . COLON SURGERY  04/2011  . COLONOSCOPY  05/03/2011   Procedure: COLONOSCOPY;  Surgeon: Jeryl Columbia, MD;  Location: [facility] ENDOSCOPY;  Service: Endoscopy;  Laterality: N/A;  . JOINT REPLACEMENT     R&L total shoulder replacements  . LUMBAR DISC SURGERY    . PARTIAL HYSTERECTOMY    . PERIPHERAL VASCULAR INTERVENTION  03/18/2019   Procedure: PERIPHERAL VASCULAR INTERVENTION;  Surgeon: Adrian Prows, MD;  [facility];  Service: Cardiovascular;;  attempted left SFA  . SHOULDER SURGERY    . TUMOR REMOVAL  Tumor removed R breast    Social History   Socioeconomic History  . Marital status: Divorced    Spouse name: Not on file  . Number of children: 2  . Years of education: Not on file  . Highest education level: Not on file  Occupational History  . Not on file  Tobacco Use  . Smoking status: Former Smoker    Packs/day: 0.75    Years: 30.00  Pack years: 22.50    Types: Cigarettes    Quit date: 2020    Years since quitting: 1.0  . Smokeless tobacco: Former Systems developer    Quit date: 04/22/2010  Substance and Sexual Activity  . Alcohol use: Yes    Comment: rarely   . Drug use: No  . Sexual activity: Not Currently  Other Topics Concern  . Not on file  Social History Narrative   Lives alone in a one story home.  Has 2 children.     On disability for low back pain since the age of 66.     Education: high school.   Social Determinants of Health   Financial Resource Strain:   . Difficulty of Paying Living Expenses: Not on file  Food Insecurity:   . Worried  About Charity fundraiser in the Last Year: Not on file  . Ran Out of Food in the Last Year: Not on file  Transportation Needs:   . Lack of Transportation (Medical): Not on file  . Lack of Transportation (Non-Medical): Not on file  Physical Activity:   . Days of Exercise per Week: Not on file  . Minutes of Exercise per Session: Not on file  Stress:   . Feeling of Stress : Not on file  Social Connections:   . Frequency of Communication with Friends and Family: Not on file  . Frequency of Social Gatherings with Friends and Family: Not on file  . Attends Religious Services: Not on file  . Active Member of Clubs or Organizations: Not on file  . Attends Archivist Meetings: Not on file  . Marital Status: Not on file  Intimate Partner Violence:   . Fear of Current or Ex-Partner: Not on file  . Emotionally Abused: Not on file  . Physically Abused: Not on file  . Sexually Abused: Not on file    Review of Systems  Constitution: Positive for malaise/fatigue. Negative for decreased appetite, weight gain and weight loss.  Eyes: Negative for visual disturbance.  Cardiovascular: Positive for claudication, dyspnea on exertion and leg swelling. Negative for chest pain, orthopnea, palpitations and syncope.  Respiratory: Negative for hemoptysis and wheezing.   Endocrine: Negative for cold intolerance and heat intolerance.  Hematologic/Lymphatic: Does not bruise/bleed easily.  Skin: Negative for nail changes and poor wound healing.  Musculoskeletal: Positive for arthritis and back pain. Negative for muscle weakness and myalgias.  Gastrointestinal: Negative for abdominal pain, change in bowel habit, nausea and vomiting.  Neurological: Positive for dizziness and weakness. Negative for difficulty with concentration, focal weakness and headaches.  Psychiatric/Behavioral: Negative for altered mental status and suicidal ideas.  All other systems reviewed and are negative.     Objective    Blood pressure (!) 105/41, pulse (!) 44, temperature 97.9 F (36.6 C), height 5' (1.524 m), weight 131 lb (59.4 kg), SpO2 91 %. Body mass index is 25.58 kg/m.    Physical Exam  Constitutional: She is oriented to person, place, and time. Vital signs are normal. She appears well-developed and well-nourished.  HENT:  Head: Normocephalic and atraumatic.  Cardiovascular: Normal rate, regular rhythm, normal heart sounds and intact distal pulses.  Pulses:      Femoral pulses are 2+ on the right side with bruit.      Popliteal pulses are 1+ on the right side and 1+ on the left side.       Dorsalis pedis pulses are 1+ on the right side and 0 on the left side.  Posterior tibial pulses are 1+ on the right side and 0 on the left side.  Mild Erythema bilateral R>L. Left medial malleolus shows a small scar like lesion that does not appear ischemia. No discharge.  No leg edema  Pulmonary/Chest: Effort normal and breath sounds normal. No accessory muscle usage. No respiratory distress.  Abdominal: Soft. Bowel sounds are normal.  Musculoskeletal:        General: Normal range of motion.     Cervical back: Normal range of motion.  Neurological: She is alert and oriented to person, place, and time.  Skin: Skin is warm and dry. No erythema.  Vitals reviewed.  Radiology: No results found.  Laboratory examination:    CMP Latest Ref Rng & Units 06/12/2019 03/07/2019 12/09/2018  Glucose 65 - 99 mg/dL 90 88 84  BUN 8 - 27 mg/dL 24 49(H) 32(H)  Creatinine 0.57 - 1.00 mg/dL 1.23(H) 1.73(H) 1.36(H)  Sodium 134 - 144 mmol/L 144 142 143  Potassium 3.5 - 5.2 mmol/L 4.7 4.3 5.2  Chloride 96 - 106 mmol/L 108(H) 104 101  CO2 20 - 29 mmol/L 25 25 27   Calcium 8.7 - 10.3 mg/dL 10.3 10.5(H) 10.8(H)  Total Protein 6.0 - 8.5 g/dL 6.1 - -  Total Bilirubin 0.0 - 1.2 mg/dL 0.2 - -  Alkaline Phos 39 - 117 IU/L 98 - -  AST 0 - 40 IU/L 21 - -  ALT 0 - 32 IU/L 14 - -   CBC Latest Ref Rng & Units 06/12/2019  03/07/2019 02/22/2016  WBC 3.4 - 10.8 x10E3/uL 4.9 5.6 5.5  Hemoglobin 11.1 - 15.9 g/dL 10.6(L) 14.0 13.3  Hematocrit 34.0 - 46.6 % 32.0(L) 41.2 39.0  Platelets 150 - 450 x10E3/uL 208 225 203   Lipid Panel     Component Value Date/Time   CHOL 132 06/12/2019 1502   TRIG 62 06/12/2019 1502   HDL 73 06/12/2019 1502   CHOLHDL 1.8 06/12/2019 1502   LDLCALC 46 06/12/2019 1502   HEMOGLOBIN A1C Lab Results  Component Value Date   HGBA1C 6.1 (H) 10/28/2014   MPG 128 10/28/2014   TSH No results for input(s): TSH in the last 8760 hours.  PRN Meds:. Medications Discontinued During This Encounter  Medication Reason  . aspirin EC 81 MG tablet Discontinued by provider  . metoprolol tartrate (LOPRESSOR) 25 MG tablet Discontinued by provider   Current Meds  Medication Sig  . ALPRAZolam (XANAX) 1 MG tablet as needed.   Marland Kitchen amLODipine (NORVASC) 5 MG tablet Take 1 tablet (5 mg total) by mouth daily.  Marland Kitchen atorvastatin (LIPITOR) 10 MG tablet TAKE 1 TABLET BY MOUTH EVERY DAY  . buPROPion (WELLBUTRIN XL) 150 MG 24 hr tablet Take 150 mg by mouth daily.  . furosemide (LASIX) 20 MG tablet Take 20 mg by mouth daily as needed for edema.   . gabapentin (NEURONTIN) 100 MG capsule Take 100 mg by mouth 3 (three) times daily.  . hydrALAZINE (APRESOLINE) 25 MG tablet Take 1 tablet (25 mg total) by mouth 3 (three) times daily.  Marland Kitchen losartan-hydrochlorothiazide (HYZAAR) 100-12.5 MG tablet Take 1 tablet by mouth daily.  . mirtazapine (REMERON) 30 MG tablet 30 mg daily.   Marland Kitchen oxyCODONE (OXYCONTIN) 60 MG 12 hr tablet Take 1 tablet by mouth every 8 (eight) hours.  . potassium chloride (K-DUR) 10 MEQ tablet TAKE 1 TABLET (10 MEQ TOTAL) BY MOUTH DAILY. TAKE WITH LASIX (Patient taking differently: Take 10 mEq by mouth daily as needed. Take with lasix)  . rOPINIRole (  REQUIP) 0.5 MG tablet Take 0.5 mg by mouth at bedtime as needed ("restlessness").   . [DISCONTINUED] aspirin EC 81 MG tablet Take 81 mg by mouth daily.  .  [DISCONTINUED] metoprolol tartrate (LOPRESSOR) 25 MG tablet Take 1 tablet (25 mg total) by mouth 2 (two) times daily.    Cardiac Studies:   01/11/2015: Hospital Lower extremity arterial duplex Evidence of multilevel arterial occlusive disease bilaterally, left greater than right. Resting ankle-brachial index is mildly depressed on the right at 0.85. The left ABI is moderately depressed at rest at 0.65. On the right side, there likely is a component of aortoiliac inflow disease given monophasic waveforms throughout.  Echocardiogram 11/26/2018: Normal LV systolic function with EF 57%. Left ventricle cavity is normal in size. Normal global wall motion. Doppler evidence of grade I (impaired) diastolic dysfunction, normal LAP. Calculated EF 57%. Left atrial cavity is mildly dilated. Mild (Grade I) mitral regurgitation. Mild to moderate tricuspid regurgitation. Estimated pulmonary artery systolic pressure is 18-56 mmHg. IVC is dilated with blunted respiratory response. Estimated RA pressure 10-15 mmHg.  Lower Extremity Arterial Duplex 01/14/2019: There is monophasic waveform noted in the right external iliac and CFA suggests proximal significant disease. There is severe diffuse mixed plaque noted throughout the right lower extremity.  Moderate velocity increase at the left distal superficial femoral artery, >50% stenosis. There is severe diffuse mixed plaque throughout the left lower extremity This exam reveals moderately decreased perfusion of the right lower extremity, noted at the dorsalis pedis artery level (ABI 0.75) and moderately decreased perfusion of the left lower extremity, noted at the post tibial artery level (ABI 0.58).  No significant change from report of 01/11/2015.   Peripheral arteriogram 03/18/2019:  Dist R EIA to Prox R CFA lesion is 60% stenosed.  Prox R CIA to Mid R EIA lesion is 20% stenosed.  Prox L CIA to Mid L CFA lesion is 20% stenosed.  Prox L SFA to Dist L SFA  lesion is 100% stenosed.  Three-vessel runoff below the knee on the left with brisk  flow.  Failed attempt at angioplasty of the left SFA.   Suprarenal Abd AO to Infrarenal Abd AO lesion is 30% stenosed. Severely calcified lesions.  Abdominal aortogram also reveals patent renal arteries 1 on either side. There is no evidence of abdominal aortic aneurysm.   Assessment     ICD-10-CM   1. Bradycardia  R00.1 EKG 12-Lead  2. Bleeding from varicose veins of left lower extremity  I83.892   3. Fatigue, unspecified type  R53.83     EKG 06/16/2019: Marked sinus bradycardia at 45 bpm, norma axis, IRBBB  Recommendations:   Patient has recently had significant bleeding from varicose vein and has since been off of her Plavix. She did have 4 point decrease in Hbg on her recent CBC. I will also hold her aspirin for now. She has previously had venous ablation performed, she may benefit from reevaluation with them due to her bleeding varicosities. Will plan to recheck her CBC in the next few weeks to ensure that this is improving. No other signs of bleeding. While I do suspect that this is contributing to her symptoms, she is also noted to be markedly bradycardic. I feel that this is also likely etiology. She is not orthostatic; however, BP is soft. I will discontinue metoprolol. She has had a few falls due to her dizziness and weakness, but it does not sound like this was related to syncope. She fortunately has not injured herself  during the falls, except for some reported bruising to her face that has now resolved. She is not disoriented, but did easily go to sleep on a couple of occasions during my exam today if not stimulated. Her friend at the bedside, states that she has seen some improvement in her in the last few days as far as her energy levels. Will continue to closely monitor. I would like to see her back in the next 1 week for close follow up. I have advised them that should her symptoms worsen or if  she has recurrence of bleeding, she will need to go to ER. They verbalized understanding.     Miquel Dunn, MSN, APRN, FNP-C [facility]. [place] Office: [phone] Fax: [phone]

## 2019-06-20 ENCOUNTER — Observation Stay (HOSPITAL_COMMUNITY)
Admission: EM | Admit: 2019-06-20 | Discharge: 2019-06-22 | Disposition: A | Discharge status: Home / Self Care | Payer: Medicare Other | Attending: Family Medicine | Admitting: Family Medicine

## 2019-06-20 ENCOUNTER — Other Ambulatory Visit: Payer: Self-pay | Admitting: Cardiology

## 2019-06-20 ENCOUNTER — Other Ambulatory Visit: Payer: Self-pay

## 2019-06-20 ENCOUNTER — Telehealth: Payer: Self-pay | Admitting: Cardiology

## 2019-06-20 ENCOUNTER — Emergency Department (HOSPITAL_COMMUNITY): Payer: Medicare Other

## 2019-06-20 DIAGNOSIS — R579 Shock, unspecified: Secondary | ICD-10-CM | POA: Diagnosis present

## 2019-06-20 DIAGNOSIS — N1831 Chronic kidney disease, stage 3a: Secondary | ICD-10-CM | POA: Insufficient documentation

## 2019-06-20 DIAGNOSIS — F419 Anxiety disorder, unspecified: Secondary | ICD-10-CM | POA: Insufficient documentation

## 2019-06-20 DIAGNOSIS — Z9181 History of falling: Secondary | ICD-10-CM | POA: Insufficient documentation

## 2019-06-20 DIAGNOSIS — I959 Hypotension, unspecified: Secondary | ICD-10-CM | POA: Diagnosis not present

## 2019-06-20 DIAGNOSIS — D649 Anemia, unspecified: Secondary | ICD-10-CM | POA: Insufficient documentation

## 2019-06-20 DIAGNOSIS — Z85038 Personal history of other malignant neoplasm of large intestine: Secondary | ICD-10-CM | POA: Insufficient documentation

## 2019-06-20 DIAGNOSIS — I83899 Varicose veins of unspecified lower extremities with other complications: Secondary | ICD-10-CM

## 2019-06-20 DIAGNOSIS — F329 Major depressive disorder, single episode, unspecified: Secondary | ICD-10-CM | POA: Insufficient documentation

## 2019-06-20 DIAGNOSIS — J432 Centrilobular emphysema: Secondary | ICD-10-CM | POA: Diagnosis not present

## 2019-06-20 DIAGNOSIS — R001 Bradycardia, unspecified: Secondary | ICD-10-CM | POA: Diagnosis not present

## 2019-06-20 DIAGNOSIS — N179 Acute kidney failure, unspecified: Secondary | ICD-10-CM | POA: Diagnosis present

## 2019-06-20 DIAGNOSIS — I130 Hypertensive heart and chronic kidney disease with heart failure and stage 1 through stage 4 chronic kidney disease, or unspecified chronic kidney disease: Secondary | ICD-10-CM | POA: Diagnosis not present

## 2019-06-20 DIAGNOSIS — X31XXXA Exposure to excessive natural cold, initial encounter: Secondary | ICD-10-CM | POA: Diagnosis not present

## 2019-06-20 DIAGNOSIS — E785 Hyperlipidemia, unspecified: Secondary | ICD-10-CM | POA: Diagnosis not present

## 2019-06-20 DIAGNOSIS — I5032 Chronic diastolic (congestive) heart failure: Secondary | ICD-10-CM | POA: Diagnosis not present

## 2019-06-20 DIAGNOSIS — Z79899 Other long term (current) drug therapy: Secondary | ICD-10-CM | POA: Insufficient documentation

## 2019-06-20 DIAGNOSIS — Z87891 Personal history of nicotine dependence: Secondary | ICD-10-CM | POA: Insufficient documentation

## 2019-06-20 DIAGNOSIS — J449 Chronic obstructive pulmonary disease, unspecified: Secondary | ICD-10-CM | POA: Diagnosis present

## 2019-06-20 DIAGNOSIS — F32A Depression, unspecified: Secondary | ICD-10-CM | POA: Diagnosis present

## 2019-06-20 DIAGNOSIS — I10 Essential (primary) hypertension: Secondary | ICD-10-CM | POA: Diagnosis present

## 2019-06-20 DIAGNOSIS — I83892 Varicose veins of left lower extremities with other complications: Secondary | ICD-10-CM | POA: Diagnosis not present

## 2019-06-20 DIAGNOSIS — Z20822 Contact with and (suspected) exposure to covid-19: Secondary | ICD-10-CM | POA: Insufficient documentation

## 2019-06-20 DIAGNOSIS — Z96611 Presence of right artificial shoulder joint: Secondary | ICD-10-CM | POA: Diagnosis not present

## 2019-06-20 DIAGNOSIS — T68XXXA Hypothermia, initial encounter: Secondary | ICD-10-CM | POA: Insufficient documentation

## 2019-06-20 DIAGNOSIS — Z96612 Presence of left artificial shoulder joint: Secondary | ICD-10-CM | POA: Diagnosis not present

## 2019-06-20 DIAGNOSIS — F1120 Opioid dependence, uncomplicated: Secondary | ICD-10-CM | POA: Diagnosis present

## 2019-06-20 DIAGNOSIS — Z66 Do not resuscitate: Secondary | ICD-10-CM | POA: Insufficient documentation

## 2019-06-20 DIAGNOSIS — R578 Other shock: Principal | ICD-10-CM | POA: Insufficient documentation

## 2019-06-20 DIAGNOSIS — G8929 Other chronic pain: Secondary | ICD-10-CM | POA: Diagnosis not present

## 2019-06-20 DIAGNOSIS — Z853 Personal history of malignant neoplasm of breast: Secondary | ICD-10-CM | POA: Insufficient documentation

## 2019-06-20 LAB — CBC WITH DIFFERENTIAL/PLATELET
Abs Immature Granulocytes: 0.01 10*3/uL (ref 0.00–0.07)
Basophils Absolute: 0 10*3/uL (ref 0.0–0.1)
Basophils Relative: 1 %
Eosinophils Absolute: 0 10*3/uL (ref 0.0–0.5)
Eosinophils Relative: 1 %
HCT: 33 % — ABNORMAL LOW (ref 36.0–46.0)
Hemoglobin: 10.3 g/dL — ABNORMAL LOW (ref 12.0–15.0)
Immature Granulocytes: 0 %
Lymphocytes Relative: 30 %
Lymphs Abs: 1.1 10*3/uL (ref 0.7–4.0)
MCH: 29.1 pg (ref 26.0–34.0)
MCHC: 31.2 g/dL (ref 30.0–36.0)
MCV: 93.2 fL (ref 80.0–100.0)
Monocytes Absolute: 0.4 10*3/uL (ref 0.1–1.0)
Monocytes Relative: 11 %
Neutro Abs: 2.1 10*3/uL (ref 1.7–7.7)
Neutrophils Relative %: 57 %
Platelets: 211 10*3/uL (ref 150–400)
RBC: 3.54 MIL/uL — ABNORMAL LOW (ref 3.87–5.11)
RDW: 12.8 % (ref 11.5–15.5)
WBC: 3.7 10*3/uL — ABNORMAL LOW (ref 4.0–10.5)
nRBC: 0 % (ref 0.0–0.2)

## 2019-06-20 LAB — COMPREHENSIVE METABOLIC PANEL
ALT: 18 U/L (ref 0–44)
AST: 27 U/L (ref 15–41)
Albumin: 3.5 g/dL (ref 3.5–5.0)
Alkaline Phosphatase: 90 U/L (ref 38–126)
Anion gap: 9 (ref 5–15)
BUN: 28 mg/dL — ABNORMAL HIGH (ref 8–23)
CO2: 29 mmol/L (ref 22–32)
Calcium: 10.5 mg/dL — ABNORMAL HIGH (ref 8.9–10.3)
Chloride: 101 mmol/L (ref 98–111)
Creatinine, Ser: 1.53 mg/dL — ABNORMAL HIGH (ref 0.44–1.00)
GFR calc Af Amer: 39 mL/min — ABNORMAL LOW (ref >60)
GFR calc non Af Amer: 34 mL/min — ABNORMAL LOW (ref >60)
Glucose, Bld: 101 mg/dL — ABNORMAL HIGH (ref 70–99)
Potassium: 4.1 mmol/L (ref 3.5–5.1)
Sodium: 139 mmol/L (ref 135–145)
Total Bilirubin: 0.3 mg/dL (ref 0.3–1.2)
Total Protein: 6.6 g/dL (ref 6.5–8.1)

## 2019-06-20 LAB — POCT I-STAT EG7
Acid-Base Excess: 3 mmol/L — ABNORMAL HIGH (ref 0.0–2.0)
Bicarbonate: 30.3 mmol/L — ABNORMAL HIGH (ref 20.0–28.0)
Calcium, Ion: 1.33 mmol/L (ref 1.15–1.40)
HCT: 32 % — ABNORMAL LOW (ref 36.0–46.0)
Hemoglobin: 10.9 g/dL — ABNORMAL LOW (ref 12.0–15.0)
O2 Saturation: 99 %
Potassium: 4 mmol/L (ref 3.5–5.1)
Sodium: 138 mmol/L (ref 135–145)
TCO2: 32 mmol/L (ref 22–32)
pCO2, Ven: 59.9 mmHg (ref 44.0–60.0)
pH, Ven: 7.313 (ref 7.250–7.430)
pO2, Ven: 175 mmHg — ABNORMAL HIGH (ref 32.0–45.0)

## 2019-06-20 LAB — POC SARS CORONAVIRUS 2 AG -  ED: SARS Coronavirus 2 Ag: NEGATIVE

## 2019-06-20 LAB — PROTIME-INR
INR: 0.9 (ref 0.8–1.2)
Prothrombin Time: 12.3 seconds (ref 11.4–15.2)

## 2019-06-20 LAB — ETHANOL: Alcohol, Ethyl (B): <10 mg/dL (ref <10)

## 2019-06-20 LAB — APTT: aPTT: 32 seconds (ref 24–36)

## 2019-06-20 LAB — D-DIMER, QUANTITATIVE: D-Dimer, Quant: 0.56 ug/mL-FEU — ABNORMAL HIGH (ref 0.00–0.50)

## 2019-06-20 MED ORDER — VANCOMYCIN HCL IN DEXTROSE 1-5 GM/200ML-% IV SOLN: 1000.0000 mg | Freq: Once | INTRAVENOUS | Status: DC

## 2019-06-20 MED ORDER — VANCOMYCIN HCL 1250 MG/250ML IV SOLN
1250.0000 mg | Freq: Once | INTRAVENOUS | Status: AC
  Administered 2019-06-21: 1250 mg via INTRAVENOUS
  Filled 2019-06-20: qty 250

## 2019-06-20 MED ORDER — METRONIDAZOLE IN NACL 5-0.79 MG/ML-% IV SOLN
500.0000 mg | Freq: Once | INTRAVENOUS | Status: AC
  Administered 2019-06-20: 500 mg via INTRAVENOUS
  Filled 2019-06-20: qty 100

## 2019-06-20 MED ORDER — SODIUM CHLORIDE 0.9 % IV BOLUS (SEPSIS)
1000.0000 mL | Freq: Once | INTRAVENOUS | Status: AC
  Administered 2019-06-20: 1000 mL via INTRAVENOUS

## 2019-06-20 MED ORDER — VANCOMYCIN HCL IN DEXTROSE 1-5 GM/200ML-% IV SOLN: 1000.0000 mg | INTRAVENOUS | Status: DC

## 2019-06-20 MED ORDER — SODIUM CHLORIDE 0.9 % IV SOLN
2.0000 g | Freq: Once | INTRAVENOUS | Status: AC
  Administered 2019-06-20: 2 g via INTRAVENOUS
  Filled 2019-06-20: qty 2

## 2019-06-20 MED ORDER — SODIUM CHLORIDE 0.9 % IV BOLUS
1000.0000 mL | Freq: Once | INTRAVENOUS | Status: AC
  Administered 2019-06-20: 1000 mL via INTRAVENOUS

## 2019-06-20 MED ORDER — ALBUTEROL SULFATE HFA 108 (90 BASE) MCG/ACT IN AERS
2.0000 | INHALATION_SPRAY | Freq: Once | RESPIRATORY_TRACT | Status: DC
  Filled 2019-06-20: qty 6.7

## 2019-06-20 NOTE — ED Provider Notes (Signed)
Patient care signed out at end of shift by Courtni Couture, PA-C, and Dr. Kathrynn Humble  Presenting symptoms are dizziness (intermittent) and weakness, has been falling (no reported injury). She is bradycardic, hypothermic, hypoxic and hypotensive on arrival and septic shock is considered.  There is known h/o CHF and she has 2+ pitting edema with left being slightly worse than the right. Mild erythema of left that is warm - consider cellulitis.  DDx per Kathrynn Humble: cellulitis vs metabolic vs polypharmacy vs renal failure vs electrolyte abnorms  Broad spectrum abx given for unknown source, 30 cc/kg bolus given according to sepsis protocol.   CXR clear.  Lactic acid normal.  TSH normal COVID, influenza panels negative HGB 10.3 (10.9 yesterday, 10.6 9 days ago - considered stable) UA negative for infection Neutropenic at 3.57 Slightly elevated d-dimer, not elevated for age correction but with hypoxia and LE edema, felt CTA was indicated CTA - negative for PE - patchy opacities LLL favored atx, infection not excluded. CT head - negative  Patient reports recent change to her blood pressure medications - unsure of changes. Per chart review her Lopressor, ASA and Plavix had been d/c'd after having a varicose bleed with blood loss and decrease to her HGB. This was felt to be contributing to symptoms of dizziness and weakness that was discussed on yesterday's visit to cardiology.   Patient has continued to be hypotensive, however, rebounding to 111/71 and 110/57. Afterward she does return to 76/49. When awakened, 98/70.   She remains hypothermic despite heated blankets initially and bair hugger later. Last check 95.2 rectally.   Blood pressure appears to have stabilized at around 01-410 systolic. Heart rate consistently in the 50's at this point. Feel she can be admitted by hospitalist.   Discussed with hospitalist who accepts the patient on to his service.    CRITICAL CARE Performed by: Dewaine Oats   Total critical care time: 65 minutes  Critical care time was exclusive of separately billable procedures and treating other patients.  Critical care was necessary to treat or prevent imminent or life-threatening deterioration.  Critical care was time spent personally by me on the following activities: development of treatment plan with patient and/or surrogate as well as nursing, discussions with consultants, evaluation of patient's response to treatment, examination of patient, obtaining history from patient or surrogate, ordering and performing treatments and interventions, ordering and review of laboratory studies, ordering and review of radiographic studies, pulse oximetry and re-evaluation of patient's condition.       Charlann Lange, PA-C 06/21/19 [place], [place], MD 06/21/19 [phone]

## 2019-06-20 NOTE — Telephone Encounter (Signed)
Called pt to no answer due to it being busy will try back later

## 2019-06-20 NOTE — Telephone Encounter (Signed)
Can you call patient back and ask her what question she had for me? It will be awhile before I can call her back

## 2019-06-20 NOTE — ED Triage Notes (Addendum)
Pt reports feeling lightheaded and dizzy for " a while" with pain in L leg. Per sister, pt had a ruptured blood vessel in her leg on 1/13 with "quite a bit of blood loss;" Pt was on plavix but had stopped due to bleeding. She has followed up with cardiology and had other medication adjustments. Since follow up appt, pt  Has had frequent falls, dizziness with position changes, and feeling lightheaded. Pt noted to be hypotensive in triage. Unable to obtain an oral or axillary temp

## 2019-06-20 NOTE — Telephone Encounter (Signed)
Called pt back due to her call, Pt asking if she can go to the ER this evening instead of this afternoon. Please advice thank you

## 2019-06-20 NOTE — Telephone Encounter (Signed)
If that is the earliest she can go, that is fine. I would prefer her to go asap due to her symptoms

## 2019-06-20 NOTE — ED Provider Notes (Addendum)
[facility] EMERGENCY DEPARTMENT Provider Note   CSN: [id number] Arrival date & time: 06/20/19  1928     History Chief Complaint  Patient presents with  . Dizziness    [name] is a 74 y.o. female.  HPI   74 y/o F with a h/o breast cancer, colon cancer, emphysema, CKD, claudication, GI bleed, varicose veins, hypertension, subdural hematoma, shortness of breath, who presents to the Ed today for eval of multiple complaints.  She is present with her sister at bedside who assists with the history.  They state that the patient has been having some intermittent dizziness, confusion, and frequent falls for the last 3 weeks.  Her sister also states that she is not sleeping at night and therefore is very tired in the morning and will often fall asleep mid conversation.  She states that the symptoms are not persistent and there are no specific aggravating or alleviating factors.  The patient is noted to be on Xanax as needed and 60 mg of on OxyContin 3 times daily.  Her sister she lives alone and manages her own medications.  Patient denies that there is any chance she has taken her medications and appropriately.  Pt denies any headaches. She does report she sustained head trauma from her falls. She has had intermittent vision problems over the last few weeks. She has intermittent difficulty with ambulation. Denies unilateral numbness, weakness. Reports sob that has been chronic for the last 6 months. Denies chest pain, abd pain. No current NVD or urinary complaints. No fevers or cough.   She was seen at Dr. Irven Shelling office a few days ago and noted to be bradycardic therefore was taken off her metoprolol. Her sxs have not improved since this occurred.   They state her symptoms started after she had bleeding from a varicose vein that did not stop for several hours.  Due to this she was discontinued on her Plavix.  Past Medical History:  Diagnosis Date  . Acute renal  failure superimposed on stage 3 chronic kidney disease ([place]) 10/27/2014  . Back pain, chronic   . Breast cancer ([place]) 09/09/10  . Cancer of colon ([place]) 05/24/2011  . Centrilobular emphysema ([place]) 07/15/2018  . CKD (chronic kidney disease), stage III   . Claudication, intermittent ([place]) 07/15/2018  . Depression   . GIB (gastrointestinal bleeding)   . H/O tobacco use, presenting hazards to health Quit Dec 2019 05/03/2011   50 years, up to 3PPD quit last year.   Marland Kitchen Hx of varicose veins of lower extremity 07/15/2018  . Hypertension   . SDH (subdural hematoma) ([place])   . SOB (shortness of breath) 07/15/2018    Patient Active Problem List   Diagnosis Date Noted  . SOB (shortness of breath) 07/15/2018  . Laboratory examination 07/15/2018  . Claudication, intermittent ([place]) 07/15/2018  . Centrilobular emphysema ([place]) 07/15/2018  . Hx of varicose veins of lower extremity 07/15/2018  . Back pain 01/21/2016  . Sacroiliac joint dysfunction of left side 12/27/2015  . History of colon cancer 09/21/2015  . Iron deficiency anemia 06/04/2015  . Syncope 11/05/2014  . Narcotic addiction ([place])   . Chronic pain 10/27/2014  . Faintness   . Frequent falls 08/22/2014  . Facial contusion 08/22/2014  . Subdural hematoma ([place]) 08/21/2014  . Acute mastitis of right breast 04/05/2012  . Right shoulder pain 03/27/2012  . Breast erythema most likely secondary to mastitis / cellulitis 03/24/2012  . Hyponatremia 03/24/2012  . Cancer of  colon ([place]) 05/24/2011  . Cancer of right breast, stage 1 ([place]) 05/24/2011  . Colonic mass 05/03/2011  . H/O tobacco use, presenting hazards to health 05/03/2011  . Weakness generalized 05/01/2011  . Anemia 05/01/2011  . Falls frequently 05/01/2011  . GI bleed 05/01/2011  . Hypokalemia 05/01/2011  . HTN (hypertension) 05/01/2011  . Anxiety and depression 05/01/2011  . Lower extremity edema 05/01/2011    Past Surgical History:  Procedure Laterality Date  . ABDOMINAL  AORTOGRAM W/LOWER EXTREMITY Left 03/18/2019   Procedure: ABDOMINAL AORTOGRAM W/LOWER EXTREMITY;  Surgeon: Adrian Prows, MD;  Location: [facility];  Service: Cardiovascular;  Laterality: Left;  . BACK SURGERY    . BREAST LUMPECTOMY  10/10/2010   Rt Breast  . COLON SURGERY  04/2011  . COLONOSCOPY  05/03/2011   Procedure: COLONOSCOPY;  Surgeon: Jeryl Columbia, MD;  Location: [facility] ENDOSCOPY;  Service: Endoscopy;  Laterality: N/A;  . JOINT REPLACEMENT     R&L total shoulder replacements  . LUMBAR DISC SURGERY    . PARTIAL HYSTERECTOMY    . PERIPHERAL VASCULAR INTERVENTION  03/18/2019   Procedure: PERIPHERAL VASCULAR INTERVENTION;  Surgeon: Adrian Prows, MD;  Location: [facility];  Service: Cardiovascular;;  attempted left SFA  . SHOULDER SURGERY    . TUMOR REMOVAL  Tumor removed R breast     OB History   No obstetric history on file.     Family History  Problem Relation Age of Onset  . Diabetes Mother   . Hypertension Mother   . Cancer Mother        leukemia  . Sudden death Mother   . Heart attack Mother   . Anesthesia problems Neg Hx   . Hypotension Neg Hx   . Malignant hyperthermia Neg Hx   . Pseudochol deficiency Neg Hx   . Breast cancer Neg Hx     Social History   Tobacco Use  . Smoking status: Former Smoker    Packs/day: 0.75    Years: 30.00    Pack years: 22.50    Types: Cigarettes    Quit date: 2020    Years since quitting: 1.0  . Smokeless tobacco: Former Systems developer    Quit date: 04/22/2010  Substance Use Topics  . Alcohol use: Yes    Comment: rarely   . Drug use: No    Home Medications Prior to Admission medications   Medication Sig Start Date End Date Taking? Authorizing Provider  ALPRAZolam Duanne Moron) 1 MG tablet as needed.  04/22/16   [provider]  amLODipine (NORVASC) 5 MG tablet Take 1 tablet (5 mg total) by mouth daily. 10/21/18 06/16/19  Miquel Dunn, NP  atorvastatin (LIPITOR) 10 MG tablet TAKE 1 TABLET BY MOUTH EVERY DAY 06/03/19    Miquel Dunn, NP  buPROPion (WELLBUTRIN XL) 150 MG 24 hr tablet Take 150 mg by mouth daily.    [provider]  furosemide (LASIX) 20 MG tablet Take 20 mg by mouth daily as needed for edema.     [provider]  gabapentin (NEURONTIN) 100 MG capsule Take 100 mg by mouth 3 (three) times daily. 04/01/19   [provider]  hydrALAZINE (APRESOLINE) 25 MG tablet Take 1 tablet (25 mg total) by mouth 3 (three) times daily. 08/01/18   Adrian Prows, MD  losartan-hydrochlorothiazide (HYZAAR) 100-12.5 MG tablet Take 1 tablet by mouth daily. 03/21/19   Adrian Prows, MD  mirtazapine (REMERON) 30 MG tablet 30 mg daily.  04/22/16  [provider]  oxyCODONE (OXYCONTIN) 60 MG 12 hr tablet Take 1 tablet by mouth every 8 (eight) hours.    [provider]  potassium chloride (K-DUR) 10 MEQ tablet TAKE 1 TABLET (10 MEQ TOTAL) BY MOUTH DAILY. TAKE WITH LASIX Patient taking differently: Take 10 mEq by mouth daily as needed. Take with lasix 11/12/18   Adrian Prows, MD  rOPINIRole (REQUIP) 0.5 MG tablet Take 0.5 mg by mouth at bedtime as needed ("restlessness").     [provider]    Allergies    Patient has no known allergies.  Review of Systems   Review of Systems  Constitutional: Negative for chills and fever.  HENT: Negative for ear pain and sore throat.   Eyes: Negative for visual disturbance.  Respiratory: Positive for shortness of breath. Negative for cough.   Cardiovascular: Negative for chest pain.  Gastrointestinal: Negative for abdominal pain, constipation, diarrhea, nausea and vomiting.  Genitourinary: Negative for dysuria and hematuria.  Musculoskeletal: Negative for back pain.  Skin: Negative for color change and rash.  Neurological: Positive for dizziness, speech difficulty (per sister) and light-headedness. Negative for numbness.       AMS  All other systems reviewed and are negative.   Physical Exam Updated Vital Signs BP (!) 89/53    Pulse 72   Temp (!) 95.4 F (35.2 C) (Rectal)   Resp (!) 9   Ht 5' (1.524 m)   Wt 59.4 kg   SpO2 95%   BMI 25.58 kg/m   Physical Exam Vitals and nursing note reviewed.  Constitutional:      General: She is not in acute distress.    Appearance: She is well-developed.  HENT:     Head: Normocephalic and atraumatic.     Mouth/Throat:     Mouth: Mucous membranes are dry.  Eyes:     Conjunctiva/sclera: Conjunctivae normal.  Cardiovascular:     Rate and Rhythm: Normal rate. Rhythm irregular.     Heart sounds: Normal heart sounds. No murmur.  Pulmonary:     Effort: Pulmonary effort is normal. No respiratory distress.     Breath sounds: Wheezing present. No rhonchi or rales.  Abdominal:     General: Bowel sounds are normal.     Palpations: Abdomen is soft.     Tenderness: There is no abdominal tenderness. There is no guarding or rebound.  Musculoskeletal:     Cervical back: Neck supple.     Comments: BLE edema (L>R). Erythema noted to the lle with normal  Skin:    General: Skin is warm and dry.  Neurological:     Mental Status: She is alert.     Comments: Mental Status:  Falls asleep during exam but easily arousable to voice, able to give a general history. Speech is slow and intermittently slurred. No aphasia. Able to follow 2 step commands without difficulty.  Cranial Nerves:  II:  Peripheral visual fields grossly normal, pupils equal, round, reactive to light III,IV, VI: ptosis not present, extra-ocular motions intact bilaterally  V,VII: smile symmetric, facial light touch sensation equal VIII: hearing grossly normal to voice  X: uvula elevates symmetrically  XI: bilateral shoulder shrug symmetric and strong XII: midline tongue extension without fassiculations Motor:  Normal tone. 5/5 strength of BUE and BLE major muscle groups including strong and equal grip strength and dorsiflexion/plantar flexion Sensory: light touch normal in all extremities. Cerebellar: normal  finger-to-nose with bilateral upper extremities Gait: not assessed due to hypotension and concern for syncope  CV: 2+ radial and DP pulses     ED Results / Procedures / Treatments   Labs (all labs ordered are listed, but only abnormal results are displayed) Labs Reviewed  CBC WITH DIFFERENTIAL/PLATELET - Abnormal; Notable for the following components:      Result Value   WBC 3.7 (*)    RBC 3.54 (*)    Hemoglobin 10.3 (*)    HCT 33.0 (*)    All other components within normal limits  COMPREHENSIVE METABOLIC PANEL - Abnormal; Notable for the following components:   Glucose, Bld 101 (*)    BUN 28 (*)    Creatinine, Ser 1.53 (*)    Calcium 10.5 (*)    GFR calc non Af Amer 34 (*)    GFR calc Af Amer 39 (*)    All other components within normal limits  D-DIMER, QUANTITATIVE (NOT AT [facility]) - Abnormal; Notable for the following components:   D-Dimer, Quant 0.56 (*)    All other components within normal limits  POCT I-STAT EG7 - Abnormal; Notable for the following components:   pO2, Ven 175.0 (*)    Bicarbonate 30.3 (*)    Acid-Base Excess 3.0 (*)    HCT 32.0 (*)    Hemoglobin 10.9 (*)    All other components within normal limits  CULTURE, BLOOD (ROUTINE X 2)  CULTURE, BLOOD (ROUTINE X 2)  URINE CULTURE  RESPIRATORY PANEL BY RT PCR (FLU A&B, COVID)  ETHANOL  APTT  PROTIME-INR  URINALYSIS, ROUTINE W REFLEX MICROSCOPIC  RAPID URINE DRUG SCREEN, HOSP PERFORMED  LACTIC ACID, PLASMA  LACTIC ACID, PLASMA  BRAIN NATRIURETIC PEPTIDE  TSH  POC SARS CORONAVIRUS 2 AG -  ED    EKG EKG Interpretation  Date/Time:  Friday June 20 2019 20:22:37 EST Ventricular Rate:  76 PR Interval:  138 QRS Duration: 134 QT Interval:  404 QTC Calculation: 455 R Axis:   56 Text Interpretation: Sinus rhythm Supraventricular bigeminy Right bundle branch block No significant change since last tracing Confirmed by Varney Biles ([id number]) on 06/20/2019 9:33:52 PM   Radiology CT Head Wo  Contrast  Result Date: 06/20/2019 CLINICAL DATA:  Headache. EXAM: CT HEAD WITHOUT CONTRAST TECHNIQUE: Contiguous axial images were obtained from the base of the skull through the vertex without intravenous contrast. COMPARISON:  October 27, 2014 FINDINGS: Brain: There is mild cerebral atrophy with widening of the extra-axial spaces and ventricular dilatation. There are areas of decreased attenuation within the white matter tracts of the supratentorial brain, consistent with microvascular disease changes. A small chronic right basal ganglia lacunar infarct is seen. This is present on the prior study. Vascular: No hyperdense vessel or unexpected calcification. Skull: Normal. Negative for fracture or focal lesion. Sinuses/Orbits: No acute finding. Other: None. IMPRESSION: No acute intracranial pathology. Electronically Signed   By: Virgina Norfolk M.D.   On: 06/20/2019 21:06   DG Chest Portable 1 View  Result Date: 06/20/2019 CLINICAL DATA:  Shortness of breath. EXAM: PORTABLE CHEST 1 VIEW COMPARISON:  None. FINDINGS: There is no evidence of acute infiltrate, pleural effusion or pneumothorax. Small radiopaque surgical clips are seen overlying the right lung base. The cardiac silhouette is moderately enlarged. A radiopaque right shoulder replacement is noted. Chronic 6, 7 and 8 right-sided rib fractures are seen. Radiopaque pedicle screws are seen within the visualized portion of the lumbar spine. IMPRESSION: 1. No acute or active cardiopulmonary disease. 2. Stable cardiomegaly. 3. Intact right shoulder replacement. Electronically Signed   By: Joyce Gross.D.  On: 06/20/2019 21:04    Procedures Procedures (including critical care time) CRITICAL CARE Performed by: Rodney Booze   Total critical care time: 45 minutes  Critical care time was exclusive of separately billable procedures and treating other patients.  Critical care was necessary to treat or prevent imminent or life-threatening  deterioration.  Critical care was time spent personally by me on the following activities: development of treatment plan with patient and/or surrogate as well as nursing, discussions with consultants, evaluation of patient's response to treatment, examination of patient, obtaining history from patient or surrogate, ordering and performing treatments and interventions, ordering and review of laboratory studies, ordering and review of radiographic studies, pulse oximetry and re-evaluation of patient's condition.   Medications Ordered in ED Medications  sodium chloride 0.9 % bolus 1,000 mL (1,000 mLs Intravenous New Bag/Given 06/20/19 2346)  metroNIDAZOLE (FLAGYL) IVPB 500 mg (500 mg Intravenous New Bag/Given 06/20/19 2348)  albuterol (VENTOLIN HFA) 108 (90 Base) MCG/ACT inhaler 2 puff (has no administration in time range)  vancomycin (VANCOREADY) IVPB 1250 mg/250 mL (has no administration in time range)  vancomycin (VANCOCIN) IVPB 1000 mg/200 mL premix (has no administration in time range)  sodium chloride 0.9 % bolus 1,000 mL (0 mLs Intravenous Stopped 06/20/19 2346)  ceFEPIme (MAXIPIME) 2 g in sodium chloride 0.9 % 100 mL IVPB (0 g Intravenous Stopped 06/20/19 2346)    ED Course  I have reviewed the triage vital signs and the nursing notes.  Pertinent labs & imaging results that were available during my care of the patient were reviewed by me and considered in my medical decision making (see chart for details).    MDM Rules/Calculators/A&P                      74 y/o F presenting with frequent falls, intermittent dizziness, intermittent confusion for the last several weeks.   Pt initially hypotensive, and hypoxic in the mid 80s, placed on 2 L and sats improved.  Normal respiratory rate and pulse.  She is also hypothermic at 95.67F.  Code sepsis was initiated, broad-spectrum IV antibiotics (vanc, cefepime, flagyl) ordered as well as 30 cc/kg fluid bolus.  Reviewed labs CBC with leukopenia  and anemia which is stable CMP with elevated BUN/creatinine, slightly worse from prior.  Normal LFTs.  Electrolytes are normal VBG showed normal pH, normal bicarb. Coags normal EtOH negative D-dimer elevated, will order CTA chest Lab was contacted regarding the lactic acid and told nursing staff that the lactic acid was thrown away. Will redraw.   EKG sinus rhythm Supraventricular bigeminy Right bundle branch block No significant change since last tracing   CXR with No acute or active cardiopulmonary disease. 2. Stable cardiomegaly. 3. Intact right shoulder replacement.  CT head with no acute intracranial pathology.  11:30 PM Sepsis - Repeat Assessment completed.   At shift change, pending CTA chest and lactic acid. Following CTA chest, pt will require admission for undifferentiated shock. She is being tx for possible sepsis of unknown etiology (possible cellulitis of lle vs poss UTI).   Care transitioned to Charlann Lange, PA-C with plan to f/u on pending CTA and admit.   Final Clinical Impression(s) / ED Diagnoses Final diagnoses:  Shock [facility])    Rx / DC Orders ED Discharge Orders    None       Rodney Booze, PA-C 06/21/19 0002    Mikesha Migliaccio S, PA-C 06/21/19 [place], Ankit, MD 06/21/19 2333

## 2019-06-20 NOTE — Telephone Encounter (Signed)
Called pt to ask her how she is felling, pt mention she is weak, both legs swollen, and dizzy. Pt denies fever nor headaches

## 2019-06-20 NOTE — Progress Notes (Signed)
Pharmacy Antibiotic Note  [name] is a 74 y.o. female admitted on 06/20/2019 with sepsis.  Pharmacy has been consulted for Cefepime and Vancomycin dosing.  Height: 5' (152.4 cm) Weight: 130 lb 15.3 oz (59.4 kg) IBW/kg (Calculated) : 45.5  Temp (24hrs), Avg:95.4 F (35.2 C), Min:95.4 F (35.2 C), Max:95.4 F (35.2 C)  No results for input(s): WBC, CREATININE, LATICACIDVEN, VANCOTROUGH, VANCOPEAK, VANCORANDOM, GENTTROUGH, GENTPEAK, GENTRANDOM, TOBRATROUGH, TOBRAPEAK, TOBRARND, AMIKACINPEAK, AMIKACINTROU, AMIKACIN in the last 168 hours.  Estimated Creatinine Clearance: 33.4 mL/min (A) (by C-G formula based on SCr of 1.23 mg/dL (H)).    No Known Allergies  Antimicrobials this admission: 1/29 Cefepime >>  1/29 Vancomycin >>   Dose adjustments this admission:   Microbiology results: Pending  Plan: - Cefepime 2g IV q12h   - Vancomycin 1250mg  IV x 1 dose  - Followed by Vancomycin 1000mg  IV q48h - Est Calc AUC 482 - Monitor patents renal function and urine output   Thank you for allowing pharmacy to be a part of this patient's care.  Duanne Limerick PharmD. BCPS  06/20/2019 9:51 PM

## 2019-06-20 NOTE — ED Provider Notes (Signed)
  Physical Exam  BP (!) 89/53   Pulse (!) 54   Temp (!) 95.4 F (35.2 C) (Rectal)   Resp 13   Ht 5' (1.524 m)   Wt 59.4 kg   SpO2 (!) 89%   BMI 25.58 kg/m   Physical Exam  ED Course/Procedures     .Critical Care Performed by: Varney Biles, MD Authorized by: Varney Biles, MD   Critical care provider statement:    Critical care time (minutes):  36   Critical care was necessary to treat or prevent imminent or life-threatening deterioration of the following conditions:  Shock   Critical care was time spent personally by me on the following activities:  Discussions with consultants, evaluation of patient's response to treatment, examination of patient, ordering and performing treatments and interventions, ordering and review of laboratory studies, ordering and review of radiographic studies, pulse oximetry, re-evaluation of patient's condition, obtaining history from patient or surrogate and review of old charts    MDM   74 year old female with history of CKD, subdural hematoma, hypertension comes in a chief complaint of dizziness and weakness.  Dizziness is intermittent.  Patient reports that often when she is walking she feels like she is " drunk".  However her dizziness is not constant.  She denies any vision changes, focal weakness or numbness.  Patient has history of diastolic heart failure.  She has known peripheral arterial disease and venous stasis, with complications of pitting edema and also varices.  During my evaluation patient is noted to be hypotensive, hypoxic, bradycardic.  She has 2+ pitting edema in lower extremities, left worse than right.  She has mild erythema of the left lower extremity with warmth to touch.  There could be stasis dermatitis versus cellulitis.   Patient's neurologic exam did not reveal any dysmetria or nystagmus.  The dizziness is not constant therefore I doubt that this is stroke, but she certainly has risk factors for it.  If during PT  evaluation patient continues to be ataxic despite fluid resuscitation and optimization of her hemodynamics, then she probably needs to get an MRI brain or neuro consult.  Polypharmacy could also be contributing given that she is on heavy pain medications.  We had considered other possibilities like electrolyte abnormalities and renal failure, however labs were reassuring.  Metabolic causes such as hypothyroidism or adrenal insufficiency also possible.  Her bradycardia is also likely contributing to her dizziness, weakness and low blood pressure.  Our plan is to start patient on fluid resuscitation and started on antibiotics for undifferentiated shock.  We will get ultrasound DVT and a CT PE if her D-dimer is elevated.  She will need admission to the hospital for further diagnostic work-up including looking into metabolic causes.     Varney Biles, MD 06/20/19 [phone]

## 2019-06-21 ENCOUNTER — Emergency Department (HOSPITAL_COMMUNITY): Payer: Medicare Other

## 2019-06-21 ENCOUNTER — Encounter (HOSPITAL_COMMUNITY): Payer: Self-pay | Admitting: Radiology

## 2019-06-21 ENCOUNTER — Emergency Department (HOSPITAL_BASED_OUTPATIENT_CLINIC_OR_DEPARTMENT_OTHER): Payer: Medicare Other

## 2019-06-21 ENCOUNTER — Other Ambulatory Visit: Payer: Self-pay

## 2019-06-21 DIAGNOSIS — R579 Shock, unspecified: Secondary | ICD-10-CM | POA: Diagnosis not present

## 2019-06-21 DIAGNOSIS — I5032 Chronic diastolic (congestive) heart failure: Secondary | ICD-10-CM | POA: Diagnosis present

## 2019-06-21 DIAGNOSIS — R609 Edema, unspecified: Secondary | ICD-10-CM

## 2019-06-21 DIAGNOSIS — I83899 Varicose veins of unspecified lower extremities with other complications: Secondary | ICD-10-CM | POA: Diagnosis present

## 2019-06-21 DIAGNOSIS — R578 Other shock: Secondary | ICD-10-CM | POA: Diagnosis not present

## 2019-06-21 DIAGNOSIS — J449 Chronic obstructive pulmonary disease, unspecified: Secondary | ICD-10-CM | POA: Diagnosis present

## 2019-06-21 LAB — URINALYSIS, ROUTINE W REFLEX MICROSCOPIC
Bilirubin Urine: NEGATIVE
Glucose, UA: NEGATIVE mg/dL
Hgb urine dipstick: NEGATIVE
Ketones, ur: NEGATIVE mg/dL
Leukocytes,Ua: NEGATIVE
Nitrite: NEGATIVE
Protein, ur: NEGATIVE mg/dL
Specific Gravity, Urine: 1.008 (ref 1.005–1.030)
pH: 5 (ref 5.0–8.0)

## 2019-06-21 LAB — TROPONIN I (HIGH SENSITIVITY)
Troponin I (High Sensitivity): 15 ng/L (ref <18)
Troponin I (High Sensitivity): 17 ng/L (ref <18)

## 2019-06-21 LAB — BRAIN NATRIURETIC PEPTIDE: B Natriuretic Peptide: 50.9 pg/mL (ref 0.0–100.0)

## 2019-06-21 LAB — RAPID URINE DRUG SCREEN, HOSP PERFORMED
Amphetamines: NOT DETECTED
Barbiturates: NOT DETECTED
Benzodiazepines: POSITIVE — AB
Cocaine: NOT DETECTED
Opiates: POSITIVE — AB
Tetrahydrocannabinol: NOT DETECTED

## 2019-06-21 LAB — RESPIRATORY PANEL BY RT PCR (FLU A&B, COVID)
Influenza A by PCR: NEGATIVE
Influenza B by PCR: NEGATIVE
SARS Coronavirus 2 by RT PCR: NEGATIVE

## 2019-06-21 LAB — TSH: TSH: 1.221 u[IU]/mL (ref 0.350–4.500)

## 2019-06-21 LAB — LACTIC ACID, PLASMA: Lactic Acid, Venous: 1.4 mmol/L (ref 0.5–1.9)

## 2019-06-21 MED ORDER — ACETAMINOPHEN 650 MG RE SUPP: 650.0000 mg | Freq: Four times a day (QID) | RECTAL | Status: DC | PRN

## 2019-06-21 MED ORDER — LACTATED RINGERS IV SOLN: INTRAVENOUS | Status: AC

## 2019-06-21 MED ORDER — MIRTAZAPINE 15 MG PO TABS
30.0000 mg | ORAL_TABLET | Freq: Every day | ORAL | Status: DC
  Administered 2019-06-21: 30 mg via ORAL
  Filled 2019-06-21: qty 2
  Filled 2019-06-21: qty 1

## 2019-06-21 MED ORDER — LIDOCAINE 4 % EX CREA
TOPICAL_CREAM | Freq: Two times a day (BID) | CUTANEOUS | Status: DC | PRN
  Administered 2019-06-21: 1 via TOPICAL
  Filled 2019-06-21 (×3): qty 5

## 2019-06-21 MED ORDER — OXYCODONE HCL ER 10 MG PO T12A: 60.0000 mg | EXTENDED_RELEASE_TABLET | Freq: Two times a day (BID) | ORAL | Status: DC

## 2019-06-21 MED ORDER — ATORVASTATIN CALCIUM 10 MG PO TABS
10.0000 mg | ORAL_TABLET | Freq: Every day | ORAL | Status: DC
  Administered 2019-06-21 – 2019-06-22 (×2): 10 mg via ORAL
  Filled 2019-06-21 (×2): qty 1

## 2019-06-21 MED ORDER — BUPROPION HCL ER (XL) 150 MG PO TB24
150.0000 mg | ORAL_TABLET | Freq: Every day | ORAL | Status: DC
  Administered 2019-06-21 – 2019-06-22 (×2): 150 mg via ORAL
  Filled 2019-06-21 (×2): qty 1

## 2019-06-21 MED ORDER — ONDANSETRON HCL 4 MG/2ML IJ SOLN: 4.0000 mg | Freq: Four times a day (QID) | INTRAMUSCULAR | Status: DC | PRN

## 2019-06-21 MED ORDER — GABAPENTIN 100 MG PO CAPS
100.0000 mg | ORAL_CAPSULE | Freq: Once | ORAL | Status: AC
  Administered 2019-06-21: 100 mg via ORAL
  Filled 2019-06-21: qty 1

## 2019-06-21 MED ORDER — ACETAMINOPHEN 325 MG PO TABS
650.0000 mg | ORAL_TABLET | Freq: Four times a day (QID) | ORAL | Status: DC | PRN
  Administered 2019-06-22: 650 mg via ORAL
  Filled 2019-06-21: qty 2

## 2019-06-21 MED ORDER — IOHEXOL 350 MG/ML SOLN
75.0000 mL | Freq: Once | INTRAVENOUS | Status: AC | PRN
  Administered 2019-06-21: 75 mL via INTRAVENOUS

## 2019-06-21 MED ORDER — DOCUSATE SODIUM 100 MG PO CAPS
100.0000 mg | ORAL_CAPSULE | Freq: Two times a day (BID) | ORAL | Status: DC
  Filled 2019-06-21 (×3): qty 1

## 2019-06-21 MED ORDER — ONDANSETRON HCL 4 MG PO TABS: 4.0000 mg | ORAL_TABLET | Freq: Four times a day (QID) | ORAL | Status: DC | PRN

## 2019-06-21 MED ORDER — ROPINIROLE HCL 0.5 MG PO TABS
0.5000 mg | ORAL_TABLET | Freq: Every evening | ORAL | Status: DC | PRN
  Filled 2019-06-21 (×2): qty 1

## 2019-06-21 MED ORDER — ALPRAZOLAM 0.5 MG PO TABS
1.0000 mg | ORAL_TABLET | Freq: Two times a day (BID) | ORAL | Status: DC | PRN
  Administered 2019-06-21: 1 mg via ORAL
  Filled 2019-06-21: qty 2

## 2019-06-21 MED ORDER — ROPINIROLE HCL 0.5 MG PO TABS
0.5000 mg | ORAL_TABLET | Freq: Once | ORAL | Status: DC
  Filled 2019-06-21 (×2): qty 1

## 2019-06-21 MED ORDER — OXYCODONE HCL ER 10 MG PO T12A
60.0000 mg | EXTENDED_RELEASE_TABLET | Freq: Once | ORAL | Status: AC
  Administered 2019-06-21: 60 mg via ORAL
  Filled 2019-06-21: qty 6

## 2019-06-21 MED ORDER — GABAPENTIN 100 MG PO CAPS
100.0000 mg | ORAL_CAPSULE | Freq: Three times a day (TID) | ORAL | Status: DC
  Administered 2019-06-21 – 2019-06-22 (×4): 100 mg via ORAL
  Filled 2019-06-21 (×4): qty 1

## 2019-06-21 MED ORDER — SODIUM CHLORIDE 0.9% FLUSH: 3.0000 mL | Freq: Two times a day (BID) | INTRAVENOUS | Status: DC

## 2019-06-21 MED ORDER — OXYCODONE HCL ER 20 MG PO T12A
60.0000 mg | EXTENDED_RELEASE_TABLET | Freq: Three times a day (TID) | ORAL | Status: DC
  Administered 2019-06-21 – 2019-06-22 (×3): 60 mg via ORAL
  Filled 2019-06-21 (×3): qty 3

## 2019-06-21 NOTE — ED Notes (Signed)
Pt using bedside commode

## 2019-06-21 NOTE — ED Notes (Signed)
Pt up on bedside commode.

## 2019-06-21 NOTE — ED Notes (Signed)
Pt back in bed. Patient is resting comfortably.Vital signs stable.

## 2019-06-21 NOTE — ED Notes (Signed)
ED Provider at bedside. 

## 2019-06-21 NOTE — H&P (Signed)
History and Physical    Holly S Bently XLK:[id number] DOB: 1945/06/30 DOA: 06/20/2019  PCP: Maude Leriche, PA-C Consultants:  Einar Gip- cardiology; Aluisio - orthopedics Patient coming from:  Home - lives alone; NOK: No one  Chief Complaint: dizziness  HPI: [name] is a 74 y.o. female with medical history significant of SDH; HTN; remote heavy tobacco use; GI bleeding; PAD; stage 3 CKD: COPD; recently ruptured varicose veins with bleeding (Plavix paused); chronic diastolic CHF; colon cancer (2013); and breast cancer (2012) presenting with dizziness.  She reports that she slipped off the commode and knocked her head in the wall "which was no biggie."  The main reason she came in was because of a sore on her ankle bone and it broke loose about 2 weeks ago and "it looked like a massacre in my house."  She called a friend and they managed to wrap it tightly and got it to stop.  She just wasn't feeling well, was dizzy, felt drunk while walking around.  When this happened in the past, she was passing out and didn't know the reason and she went to the hospital then and her potassium was so low she could have died and her BP was very high.  Now her BP is fine.  In the last couple of weeks it went up a little bit.  It has been low here which she attributes to the bleeding.    ED Course:  Hypotension, hypothermia, septic shock.  L sided infiltrate on chest CT.  Hemodynamically improved.  May leave AMA.  Started on Cefepime, Vanc, Flagyl.    Review of Systems: As per HPI; otherwise review of systems reviewed and negative.   Ambulatory Status:  Ambulates without assistance  Past Medical History:  Diagnosis Date  . Acute renal failure superimposed on stage 3 chronic kidney disease ([place]) 10/27/2014  . Back pain, chronic   . Breast cancer ([place]) 09/09/10  . Cancer of colon ([place]) 05/24/2011  . Centrilobular emphysema ([place]) 07/15/2018  . CKD (chronic kidney disease), stage III   . Claudication,  intermittent ([place]) 07/15/2018  . Depression   . GIB (gastrointestinal bleeding)   . H/O tobacco use, presenting hazards to health Quit Dec 2019 05/03/2011   50 years, up to 3PPD quit last year.   Marland Kitchen Hx of varicose veins of lower extremity 07/15/2018  . Hypertension   . SDH (subdural hematoma) ([place])   . SOB (shortness of breath) 07/15/2018    Past Surgical History:  Procedure Laterality Date  . ABDOMINAL AORTOGRAM W/LOWER EXTREMITY Left 03/18/2019   Procedure: ABDOMINAL AORTOGRAM W/LOWER EXTREMITY;  Surgeon: Adrian Prows, MD;  Location: [facility];  Service: Cardiovascular;  Laterality: Left;  . BACK SURGERY    . BREAST LUMPECTOMY  10/10/2010   Rt Breast  . COLON SURGERY  04/2011  . COLONOSCOPY  05/03/2011   Procedure: COLONOSCOPY;  Surgeon: Jeryl Columbia, MD;  Location: [facility] ENDOSCOPY;  Service: Endoscopy;  Laterality: N/A;  . JOINT REPLACEMENT     R&L total shoulder replacements  . LUMBAR DISC SURGERY    . PARTIAL HYSTERECTOMY    . PERIPHERAL VASCULAR INTERVENTION  03/18/2019   Procedure: PERIPHERAL VASCULAR INTERVENTION;  Surgeon: Adrian Prows, MD;  Location: [facility];  Service: Cardiovascular;;  attempted left SFA  . SHOULDER SURGERY    . TUMOR REMOVAL  Tumor removed R breast    Social History   Socioeconomic History  . Marital status: Divorced    Spouse name:  Not on file  . Number of children: 2  . Years of education: Not on file  . Highest education level: Not on file  Occupational History  . Not on file  Tobacco Use  . Smoking status: Former Smoker    Packs/day: 0.75    Years: 30.00    Pack years: 22.50    Types: Cigarettes    Quit date: 2020    Years since quitting: 1.0  . Smokeless tobacco: Former Systems developer    Quit date: 04/22/2010  Substance and Sexual Activity  . Alcohol use: Yes    Comment: rarely   . Drug use: No  . Sexual activity: Not Currently  Other Topics Concern  . Not on file  Social History Narrative   Lives alone in a one story home.   Has 2 children.     On disability for low back pain since the age of 37.     Education: high school.   Social Determinants of Health   Financial Resource Strain:   . Difficulty of Paying Living Expenses: Not on file  Food Insecurity:   . Worried About Charity fundraiser in the Last Year: Not on file  . Ran Out of Food in the Last Year: Not on file  Transportation Needs:   . Lack of Transportation (Medical): Not on file  . Lack of Transportation (Non-Medical): Not on file  Physical Activity:   . Days of Exercise per Week: Not on file  . Minutes of Exercise per Session: Not on file  Stress:   . Feeling of Stress : Not on file  Social Connections:   . Frequency of Communication with Friends and Family: Not on file  . Frequency of Social Gatherings with Friends and Family: Not on file  . Attends Religious Services: Not on file  . Active Member of Clubs or Organizations: Not on file  . Attends Archivist Meetings: Not on file  . Marital Status: Not on file  Intimate Partner Violence:   . Fear of Current or Ex-Partner: Not on file  . Emotionally Abused: Not on file  . Physically Abused: Not on file  . Sexually Abused: Not on file    No Known Allergies  Family History  Problem Relation Age of Onset  . Diabetes Mother   . Hypertension Mother   . Cancer Mother        leukemia  . Sudden death Mother   . Heart attack Mother   . Anesthesia problems Neg Hx   . Hypotension Neg Hx   . Malignant hyperthermia Neg Hx   . Pseudochol deficiency Neg Hx   . Breast cancer Neg Hx     Prior to Admission medications   Medication Sig Start Date End Date Taking? Authorizing Provider  ALPRAZolam Duanne Moron) 1 MG tablet as needed.  04/22/16   [provider]  amLODipine (NORVASC) 5 MG tablet Take 1 tablet (5 mg total) by mouth daily. 10/21/18 06/16/19  Miquel Dunn, NP  atorvastatin (LIPITOR) 10 MG tablet TAKE 1 TABLET BY MOUTH EVERY DAY 06/03/19   Miquel Dunn,  NP  buPROPion (WELLBUTRIN XL) 150 MG 24 hr tablet Take 150 mg by mouth daily.    [provider]  furosemide (LASIX) 20 MG tablet Take 20 mg by mouth daily as needed for edema.     [provider]  gabapentin (NEURONTIN) 100 MG capsule Take 100 mg by mouth 3 (three) times daily. 04/01/19   [provider]  hydrALAZINE (APRESOLINE) 25 MG tablet Take 1 tablet (25 mg total) by mouth 3 (three) times daily. 08/01/18   Adrian Prows, MD  losartan-hydrochlorothiazide (HYZAAR) 100-12.5 MG tablet Take 1 tablet by mouth daily. 03/21/19   Adrian Prows, MD  mirtazapine (REMERON) 30 MG tablet 30 mg daily.  04/22/16   [provider]  oxyCODONE (OXYCONTIN) 60 MG 12 hr tablet Take 1 tablet by mouth every 8 (eight) hours.    [provider]  potassium chloride (K-DUR) 10 MEQ tablet TAKE 1 TABLET (10 MEQ TOTAL) BY MOUTH DAILY. TAKE WITH LASIX Patient taking differently: Take 10 mEq by mouth daily as needed. Take with lasix 11/12/18   Adrian Prows, MD  rOPINIRole (REQUIP) 0.5 MG tablet Take 0.5 mg by mouth at bedtime as needed ("restlessness").     [provider]    Physical Exam: Vitals:   06/21/19 1215 06/21/19 1304 06/21/19 1315 06/21/19 1506  BP: 119/67 118/62 122/78 (!) 156/79  Pulse: 83 91 86 97  Resp: 14 13 19 18   Temp:    98.6 F (37 C)  TempSrc:    Oral  SpO2: 99% 95% 97% 97%  Weight:      Height:         . General:  Appears calm and comfortable and is NAD . Eyes:  PERRL, EOMI, normal lids, iris . ENT:  grossly normal hearing, lips & tongue, mildly dry mm . Neck:  no LAD, masses or thyromegaly . Cardiovascular:  RRR, no m/r/g. No LE edema.  Marland Kitchen Respiratory:   CTA bilaterally with no wheezes/rales/rhonchi.  Normal respiratory effort. . Abdomen:  soft, NT, ND, NABS . Back:   normal alignment, no CVAT . Skin:  Varicose vein along left lateral malleolus with 3 mm wound with scab . Musculoskeletal:  grossly normal tone BUE/BLE, good ROM, no bony  abnormality . Psychiatric:  Eccentric mood and affect, speech fluent and appropriate, AOx3 . Neurologic:  CN 2-12 grossly intact, moves all extremities in coordinated fashion, hypersensitivity of left dorsal foot    Radiological Exams on Admission: CT Head Wo Contrast  Result Date: 06/20/2019 CLINICAL DATA:  Headache. EXAM: CT HEAD WITHOUT CONTRAST TECHNIQUE: Contiguous axial images were obtained from the base of the skull through the vertex without intravenous contrast. COMPARISON:  October 27, 2014 FINDINGS: Brain: There is mild cerebral atrophy with widening of the extra-axial spaces and ventricular dilatation. There are areas of decreased attenuation within the white matter tracts of the supratentorial brain, consistent with microvascular disease changes. A small chronic right basal ganglia lacunar infarct is seen. This is present on the prior study. Vascular: No hyperdense vessel or unexpected calcification. Skull: Normal. Negative for fracture or focal lesion. Sinuses/Orbits: No acute finding. Other: None. IMPRESSION: No acute intracranial pathology. Electronically Signed   By: Virgina Norfolk M.D.   On: 06/20/2019 21:06   CT Angio Chest PE W and/or Wo Contrast  Result Date: 06/21/2019 CLINICAL DATA:  Positive D-dimer.  Difficulty breathing. EXAM: CT ANGIOGRAPHY CHEST WITH CONTRAST TECHNIQUE: Multidetector CT imaging of the chest was performed using the standard protocol during bolus administration of intravenous contrast. Multiplanar CT image reconstructions and MIPs were obtained to evaluate the vascular anatomy. CONTRAST:  42mL OMNIPAQUE IOHEXOL 350 MG/ML SOLN COMPARISON:  Chest radiograph 04/13/2015 FINDINGS: Cardiovascular: Normal heart size. Coronary arterial and thoracic aortic vascular calcifications. Main pulmonary artery is mildly dilated measuring 3.1 cm. Adequate opacification of the pulmonary arterial system. No intraluminal filling defects identified to suggest acute pulmonary  embolus.  Mediastinum/Nodes: No enlarged axillary, mediastinal or hilar lymphadenopathy. Lungs/Pleura: Extra tori phase imaging. Central airways are patent. Centrilobular emphysematous change. Minimal scattered atelectasis. Patchy consolidation within the left lower lobe. No pleural effusion or pneumothorax. Upper Abdomen: Unremarkable. Musculoskeletal: Thoracic spine degenerative changes. No aggressive or acute appearing osseous lesions. Old posterior right rib fractures. Bilateral shoulder arthroplasties. Review of the MIP images confirms the above findings. IMPRESSION: 1. No evidence for acute pulmonary embolus. 2. Aortic atherosclerosis.  Emphysema. 3. Patchy consolidation within the left lower lobe favored to represent atelectasis. Infection not excluded. Electronically Signed   By: Lovey Newcomer M.D.   On: 06/21/2019 04:05   DG Chest Portable 1 View  Result Date: 06/20/2019 CLINICAL DATA:  Shortness of breath. EXAM: PORTABLE CHEST 1 VIEW COMPARISON:  None. FINDINGS: There is no evidence of acute infiltrate, pleural effusion or pneumothorax. Small radiopaque surgical clips are seen overlying the right lung base. The cardiac silhouette is moderately enlarged. A radiopaque right shoulder replacement is noted. Chronic 6, 7 and 8 right-sided rib fractures are seen. Radiopaque pedicle screws are seen within the visualized portion of the lumbar spine. IMPRESSION: 1. No acute or active cardiopulmonary disease. 2. Stable cardiomegaly. 3. Intact right shoulder replacement. Electronically Signed   By: Virgina Norfolk M.D.   On: 06/20/2019 21:04   VAS [name] LOWER EXTREMITY VENOUS (DVT) (ONLY [facility] & [facility] 7a-7p)  Result Date: 06/21/2019  Lower Venous Study Indications: Edema.  Risk Factors: History of venous insufficiency. Comparison Study: Prior study from 11/27/18 is available for comparison Performing Technologist: Sharion Dove RVS  Examination Guidelines: A complete evaluation includes B-mode imaging, spectral Doppler, color  Doppler, and power Doppler as needed of all accessible portions of each vessel. Bilateral testing is considered an integral part of a complete examination. Limited examinations for reoccurring indications may be performed as noted.  +---------+---------------+---------+-----------+----------+--------------+ RIGHT    CompressibilityPhasicitySpontaneityPropertiesThrombus Aging +---------+---------------+---------+-----------+----------+--------------+ CFV      Full           Yes      Yes                                 +---------+---------------+---------+-----------+----------+--------------+ SFJ      Full                                                        +---------+---------------+---------+-----------+----------+--------------+ FV Prox  Full                                                        +---------+---------------+---------+-----------+----------+--------------+ FV Mid   Full                                                        +---------+---------------+---------+-----------+----------+--------------+ FV DistalFull                                                        +---------+---------------+---------+-----------+----------+--------------+  PFV      Full                                                        +---------+---------------+---------+-----------+----------+--------------+ POP      Full           Yes      Yes                                 +---------+---------------+---------+-----------+----------+--------------+ PTV      Full                                                        +---------+---------------+---------+-----------+----------+--------------+ PERO     Full                                                        +---------+---------------+---------+-----------+----------+--------------+   +---------+---------------+---------+-----------+----------+--------------+ LEFT      CompressibilityPhasicitySpontaneityPropertiesThrombus Aging +---------+---------------+---------+-----------+----------+--------------+ CFV      Full           Yes      Yes                                 +---------+---------------+---------+-----------+----------+--------------+ SFJ      Full                                                        +---------+---------------+---------+-----------+----------+--------------+ FV Prox  Full                                                        +---------+---------------+---------+-----------+----------+--------------+ FV Mid   Full                                                        +---------+---------------+---------+-----------+----------+--------------+ FV DistalFull                                                        +---------+---------------+---------+-----------+----------+--------------+ PFV      Full                                                        +---------+---------------+---------+-----------+----------+--------------+  POP      Full           Yes      Yes                                 +---------+---------------+---------+-----------+----------+--------------+ PTV      Full                                                        +---------+---------------+---------+-----------+----------+--------------+ PERO     Full                                                        +---------+---------------+---------+-----------+----------+--------------+     Summary: Right: Findings appear essentially unchanged compared to previous examination. There is no evidence of deep vein thrombosis in the lower extremity. Intersitial fluid noted throughout calf Left: Findings appear essentially unchanged compared to previous examination. There is no evidence of deep vein thrombosis in the lower extremity. Intersitial fluid noted throughout calf  *See table(s) above for measurements and  observations. Electronically signed by Servando Snare MD on 06/21/2019 at 8:39:46 AM.    Final     EKG: Independently reviewed.  NSR with rate 61; nonspecific ST changes with no evidence of acute ischemia   Labs on Admission: I have personally reviewed the available labs and imaging studies at the time of the admission.  Pertinent labs:   BUN 28/Creatinine 1.53/GFR 34; 24/1.23/44 on 1/21 Calcium 10.5 WBC 3.7 Hgb 10.3; 10.6 on 1/21; 14.0 on 03/07/19 INR 0.9 ETOH <10 D-dimer 0.56 Respiratory panel PCR negative TSH 1.221 UA: unremarkable UDS: + opiates, BZD HS troponin 15, 17 VBG: 7.313/59.9/175   Assessment/Plan Principal Problem:   Shock circulatory (HCC) Active Problems:   HTN (hypertension)   Anxiety and depression   Acute kidney injury superimposed on CKD (HCC)   Narcotic addiction (HCC)   Ruptured varicose vein   COPD (chronic obstructive pulmonary disease) (HCC)   Chronic diastolic CHF (congestive heart failure) (HCC)    Circulatory shock -The patient presented with dizziness and was found to be hypotensive with refractory hypothermia -She was treated for septic shock and given 2L IVF with improvement but no source was clearly identified -While it is possible that sepsis was the underlying reason for her shock, instead it appears that this was a circulatory shock -In further discussion with the patient, earlier in the week she developed severe varicose vein bleeding of her left ankle "like a massacre" -I suspect that as she equilibrated over the next day or two, she developed hypotension and AKI which led to her presentation -At the time of my evaluation, she had improved hemodynamics and was able to provide a clear and concise history -Her hemodynamics have continued to improve throughout the day today -I have encouraged her to continue to remain hospitalized for at least one additional night to ensure ongoing improvement/stabilization - particularly since she lives  alone  Ruptured varicose vein  -She has h/o varicose veins of that leg and appears to have a varicosity along the ankle -She has a small scab and reports that  this is where she had a severe bleed earlier in the week  -Vascular surgery performs ablative procedures in their clinic -She has been seeing a vascular surgeon elsewhere who she saw on TV and he has not been much help -Will request that the [facility] team help to arrange an ASAP outpatient appointment vascular surgery for further evaluation and treatment -Meanwhile, she is having neuropathic pain along the dorsal foot - will treat with lidocaine cream  AKI on stage 3a CKD -As noted above, this appears likely due to hypovolemia associated with acute blood loss -Anticipate improvement with ongoing IVF hydration -Recheck BMP in AM  HTN -Hold BP medication (Norvasc, Hyzaar, hydralazine) due to persistent hypotension all last night  Anxiety/depression -Continue Xanax, Wellbutrin, Remeron  Chronic pain -Continue oxycontin -I have reviewed this patient in the [place] Controlled Substances Reporting System.  She is receiving medications from two providers but appears to be taking them as prescribed. -She is at high risk of opioid misuse, diversion, or overdose.  COPD -She does not appear to be taking medications for this issue at this time  Chronic diastolic CHF -0/9323 echo with preserved ER and grade 1 diastolic dysfunction -She appears to be compensated at this time -She did receive a large amount of IVF for her hypotension and will need to be monitored for development of volume overload  HLD -Continue Lipitor    Note: This patient has been tested and is negative for the novel coronavirus COVID-19.  DVT prophylaxis:   SCDs Code Status:  DNR - confirmed with patient Family Communication: None present and she did not have anyone she wanted me to call Disposition Plan:  Home once clinically improved Consults called: TOC team    Admission status: Admit - It is my clinical opinion that admission to INPATIENT is reasonable and necessary because of the expectation that this patient will require hospital care that crosses at least 2 midnights to treat this condition based on the medical complexity of the problems presented.  Given the aforementioned information, the predictability of an adverse outcome is felt to be significant.    Karmen Bongo MD [facility]   How to contact the [facility] Attending or Consulting provider [place] or covering provider during after hours [place], for this patient?  1. Check the care team in [facility] and look for a) attending/consulting [facility] provider listed and b) the [facility] team listed 2. Log into [email or web address] and use [place]'s universal password to access. If you do not have the password, please contact the hospital operator. 3. Locate the [facility] provider you are looking for under [facility] and page to a number that you can be directly reached. 4. If you still have difficulty reaching the provider, please page the [facility] (Director on Call) for the Hospitalists listed on amion for assistance.   06/21/2019, 6:28 PM

## 2019-06-21 NOTE — ED Notes (Signed)
Gave pt a lunch bag

## 2019-06-21 NOTE — ED Notes (Addendum)
Pt denied needing family contacted for an update by this RN. Room phone was given to pt & she was shown how to use it. Left sock was removed & the blankets weight was taken off of her feet (per pt request).

## 2019-06-21 NOTE — Progress Notes (Addendum)
VASCULAR LAB PRELIMINARY  PRELIMINARY  PRELIMINARY  PRELIMINARY  Bilateral lower extremity venous duplex completed.    Preliminary report:  See CV proc for preliminary results.   Sherika Kubicki, RVT 06/21/2019, 7:40 AM

## 2019-06-21 NOTE — ED Notes (Signed)
Lunch tray at bedside. ?

## 2019-06-22 DIAGNOSIS — R579 Shock, unspecified: Secondary | ICD-10-CM | POA: Diagnosis not present

## 2019-06-22 DIAGNOSIS — R578 Other shock: Secondary | ICD-10-CM | POA: Diagnosis not present

## 2019-06-22 LAB — BASIC METABOLIC PANEL
Anion gap: 7 (ref 5–15)
BUN: 26 mg/dL — ABNORMAL HIGH (ref 8–23)
CO2: 28 mmol/L (ref 22–32)
Calcium: 9.3 mg/dL (ref 8.9–10.3)
Chloride: 104 mmol/L (ref 98–111)
Creatinine, Ser: 1.24 mg/dL — ABNORMAL HIGH (ref 0.44–1.00)
GFR calc Af Amer: 50 mL/min — ABNORMAL LOW (ref >60)
GFR calc non Af Amer: 43 mL/min — ABNORMAL LOW (ref >60)
Glucose, Bld: 81 mg/dL (ref 70–99)
Potassium: 4.6 mmol/L (ref 3.5–5.1)
Sodium: 139 mmol/L (ref 135–145)

## 2019-06-22 LAB — CBC
HCT: 29.3 % — ABNORMAL LOW (ref 36.0–46.0)
Hemoglobin: 9.1 g/dL — ABNORMAL LOW (ref 12.0–15.0)
MCH: 29 pg (ref 26.0–34.0)
MCHC: 31.1 g/dL (ref 30.0–36.0)
MCV: 93.3 fL (ref 80.0–100.0)
Platelets: 172 10*3/uL (ref 150–400)
RBC: 3.14 MIL/uL — ABNORMAL LOW (ref 3.87–5.11)
RDW: 13.1 % (ref 11.5–15.5)
WBC: 3.4 10*3/uL — ABNORMAL LOW (ref 4.0–10.5)
nRBC: 0 % (ref 0.0–0.2)

## 2019-06-22 LAB — URINE CULTURE: Culture: NO GROWTH

## 2019-06-22 MED ORDER — POTASSIUM CHLORIDE ER 10 MEQ PO TBCR
10.0000 meq | EXTENDED_RELEASE_TABLET | Freq: Every day | ORAL | Status: DC
  Administered 2019-06-22: 11:00:00 10 meq via ORAL
  Filled 2019-06-22 (×2): qty 1

## 2019-06-22 MED ORDER — FUROSEMIDE 20 MG PO TABS
20.0000 mg | ORAL_TABLET | Freq: Every day | ORAL | Status: DC | PRN
  Administered 2019-06-22: 20 mg via ORAL
  Filled 2019-06-22: qty 1

## 2019-06-22 NOTE — Progress Notes (Signed)
[name] to be D/C'd  per MD order. Discussed with the patient and all questions fully answered.  VSS, Skin clean, dry and intact without evidence of skin break down, no evidence of skin tears noted.  IV catheter discontinued intact. Site without signs and symptoms of complications. Dressing and pressure applied.  An After Visit Summary was printed and given to the patient. Patient received prescription.  D/c education completed with patient/family including follow up instructions, medication list, d/c activities limitations if indicated, with other d/c instructions as indicated by MD - patient able to verbalize understanding, all questions fully answered.   Patient instructed to return to ED, call 911, or call MD for any changes in condition.   Patient to be escorted via [place], and D/C home via private auto.

## 2019-06-22 NOTE — Progress Notes (Signed)
PT Cancellation Note  Patient Details Name: [name] MRN: [id number] DOB: [birth year]   Cancelled Treatment:    Reason Eval/Treat Not Completed: Other (comment) Pt refusing physical therapy services per RN/MD. Will sign off.    Wyona Almas, PT, DPT Acute Rehabilitation Services Pager [phone] Office [phone]    Deno Etienne 06/22/2019, 11:44 AM

## 2019-06-22 NOTE — Discharge Instructions (Signed)
Hypotension As your heart beats, it forces blood through your body. Hypotension, commonly called low blood pressure, is when the force of blood pumping through your arteries is too weak. Arteries are blood vessels that carry blood from the heart throughout the body. Depending on the cause and severity, hypotension may be harmless (benign) or may cause serious problems (be critical). When blood pressure is too low, you may not get enough blood to your brain or to the rest of your organs. This can cause weakness, light-headedness, rapid heartbeat, and fainting. What are the causes? This condition may be caused by:  Blood loss.  Loss of body fluids (dehydration).  Heart problems.  Hormone (endocrine) problems.  Pregnancy.  Severe infection.  Lack of certain nutrients.  Severe allergic reactions (anaphylaxis).  Certain medicines, such as blood pressure medicine or medicines that make the body lose excess fluids (diuretics). Sometimes, hypotension may be caused by not taking medicine as directed, such as taking too much of a certain medicine. What increases the risk? The following factors may make you more likely to develop this condition:  Age. Risk increases as you get older.  Conditions that affect the heart or the central nervous system.  Taking certain medicines, such as blood pressure medicine or diuretics.  Being pregnant. What are the signs or symptoms? Common symptoms of this condition include:  Weakness.  Light-headedness.  Dizziness.  Blurred vision.  Fatigue.  Rapid heartbeat.  Fainting, in severe cases. How is this diagnosed? This condition is diagnosed based on:  Your medical history.  Your symptoms.  Your blood pressure measurement. Your health care provider will check your blood pressure when you are: ? Lying down. ? Sitting. ? Standing. A blood pressure reading is recorded as two numbers, such as "120 over 80" (or 120/80). The first ("top")  number is called the systolic pressure. It is a measure of the pressure in your arteries as your heart beats. The second ("bottom") number is called the diastolic pressure. It is a measure of the pressure in your arteries when your heart relaxes between beats. Blood pressure is measured in a unit called mm Hg. Healthy blood pressure for most adults is 120/80. If your blood pressure is below 90/60, you may be diagnosed with hypotension. Other information or tests that may be used to diagnose hypotension include:  Your other vital signs, such as your heart rate and temperature.  Blood tests.  Tilt table test. For this test, you will be safely secured to a table that moves you from a lying position to an upright position. Your heart rhythm and blood pressure will be monitored during the test. How is this treated? Treatment for this condition may include:  Changing your diet. This may involve eating more salt (sodium) or drinking more water.  Taking medicines to raise your blood pressure.  Changing the dosage of certain medicines you are taking that might be lowering your blood pressure.  Wearing compression stockings. These stockings help to prevent blood clots and reduce swelling in your legs. In some cases, you may need to go to the hospital for:  Fluid replacement. This means you will receive fluids through an IV.  Blood replacement. This means you will receive donated blood through an IV (transfusion).  Treating an infection or heart problems, if this applies.  Monitoring. You may need to be monitored while medicines that you are taking wear off. Follow these instructions at home: Eating and drinking   Drink enough fluid to keep your   urine pale yellow.  Eat a healthy diet, and follow instructions from your health care provider about eating or drinking restrictions. A healthy diet includes: ? Fresh fruits and vegetables. ? Whole grains. ? Lean meats. ? Low-fat dairy  products.  Eat extra salt only as directed. Do not add extra salt to your diet unless your health care provider told you to do that.  Eat frequent, small meals.  Avoid standing up suddenly after eating. Medicines  Take over-the-counter and prescription medicines only as told by your health care provider. ? Follow instructions from your health care provider about changing the dosage of your current medicines, if this applies. ? Do not stop or adjust any of your medicines on your own. General instructions   Wear compression stockings as told by your health care provider.  Get up slowly from lying down or sitting positions. This gives your blood pressure a chance to adjust.  Avoid hot showers and excessive heat as directed by your health care provider.  Return to your normal activities as told by your health care provider. Ask your health care provider what activities are safe for you.  Do not use any products that contain nicotine or tobacco, such as cigarettes, e-cigarettes, and chewing tobacco. If you need help quitting, ask your health care provider.  Keep all follow-up visits as told by your health care provider. This is important. Contact a health care provider if you:  Vomit.  Have diarrhea.  Have a fever for more than 2-3 days.  Feel more thirsty than usual.  Feel weak and tired. Get help right away if you:  Have chest pain.  Have a fast or irregular heartbeat.  Develop numbness in any part of your body.  Cannot move your arms or your legs.  Have trouble speaking.  Become sweaty or feel light-headed.  Faint.  Feel short of breath.  Have trouble staying awake.  Feel confused. Summary  Hypotension is when the force of blood pumping through your arteries is too weak.  Hypotension may be harmless (benign) or may cause serious problems (be critical).  Treatment for this condition may include changing your diet, changing your medicines, and wearing  compression stockings.  In some cases, you may need to go to the hospital for fluid or blood replacement. This information is not intended to replace advice given to you by your health care provider. Make sure you discuss any questions you have with your health care provider. Document Revised: 11/01/2017 Document Reviewed: 11/01/2017 [employer] Patient Education  2020 [employer]  

## 2019-06-22 NOTE — Care Management Obs Status (Signed)
[place] NOTIFICATION   Patient Details  Name: [name] MRN: [id number] Date of Birth: 1945-11-28   Medicare Observation Status Notification Given:  Yes    Claudie Leach, RN 06/22/2019, 12:48 PM

## 2019-06-22 NOTE — Care Management CC44 (Signed)
Condition Code 44 Documentation Completed  Patient Details  Name: [name] MRN: [id number] Date of Birth: 1945-12-09   Condition Code 44 given:  Yes Patient signature on Condition Code 44 notice:  Yes Documentation of 2 MD's agreement:  Yes Code 44 added to claim:  Yes    Claudie Leach, RN 06/22/2019, 12:48 PM

## 2019-06-22 NOTE — Discharge Summary (Signed)
Physician Discharge Summary  Holly S Farabaugh NOB:[id number] DOB: 1946/02/23 DOA: 06/20/2019  PCP: Maude Leriche, PA-C  Admit date: 06/20/2019 Discharge date: 06/22/2019  Admitted From: Home Disposition: Home  Recommendations for Outpatient Follow-up:  1. Follow up with PCP in 1-2 weeks 2. Follow-up with vascular surgery 3. Please obtain BMP/CBC in one week 4. Please follow up on the following pending results:  Home Health: None Equipment/Devices: None  Discharge Condition: Stable CODE STATUS: DNR Diet recommendation: Cardiac  Subjective: Seen and examined.  Feels much better.  Has no complaint.  Wants to go home.  HPI: [name] is a 74 y.o. female with medical history significant of SDH; HTN; remote heavy tobacco use; GI bleeding; PAD; stage 3 CKD: COPD; recently ruptured varicose veins with bleeding (Plavix paused); chronic diastolic CHF; colon cancer (2013); and breast cancer (2012) presenting with dizziness.  She reports that she slipped off the commode and knocked her head in the wall "which was no biggie."  The main reason she came in was because of a sore on her ankle bone and it broke loose about 2 weeks ago and "it looked like a massacre in my house."  She called a friend and they managed to wrap it tightly and got it to stop.  She just wasn't feeling well, was dizzy, felt drunk while walking around.  When this happened in the past, she was passing out and didn't know the reason and she went to the hospital then and her potassium was so low she could have died and her BP was very high.  Now her BP is fine.  In the last couple of weeks it went up a little bit.  It has been low here which she attributes to the bleeding.    ED Course:  Hypotension, hypothermia, septic shock.  L sided infiltrate on chest CT.  Hemodynamically improved.  May leave AMA.  Started on Cefepime, Vanc, Flagyl.  Brief/Interim Summary: Patient was admitted under hospitalist service for possible  circulatory shock since she was hypothermic and hypotensive.  She was given antibiotics in the ED for possible pneumonia as there was some infiltrate on the CT scan.  Antibiotics were not continued as this was not thought to be septic shock but instead secondary to shock.  She was started on IV fluids.  When I saw this patient today, she has no complaints.  Her blood pressure has remained within normal range for almost 24 hours and her temperature has remained normal for almost 36 hours now.  She did not have any lactic acidosis, leukocytosis or any fever during this hospitalization.  I do not suspect any infection.  Infiltrate seen on the CT chest was likely atelectasis.  She is not hypoxic and not having any symptoms.  She also came in with AKI on CKD stage III which has resolved and her renal function is back to her baseline.  Patient was asking me why she was having intermittent dizziness at home.  After talking to her, she informed me that she drinks barely 500 cc of water per day but on top of that she takes Lasix and also drinks 6 cup of coffee.  I did explain to her that she is not drinking enough water and on top of that she is likely diuresing what ever she is drinking due to her drinking 6 cups of coffee and Lasix.  I explained to her and educated her to drink more water and at least 1500 cc/day and reduce her  coffee consumption.  Other cause of her dizziness could be possible intermittent hypotension as well as polypharmacy as she is on 6 different medications which may cause dizziness such as gabapentin, Requip, Wellbutrin, Xanax, Lasix, Remeron, OxyContin.  I offered patient to be seen by PT OT so we can assess if she needs any home health however patient was very adamant on not being seen by PT OT and wants to go home.  Honoring her request, I am discharging this patient to home. Of note, patient had told us that she had bleeding from her varicose veins.  On examination, I do not see any severe  varicose veins and she has a well-formed scab at the site where she bled, on left ankle.  There are no open sores that needs to be evaluated by wound care or vascular surgery urgently during this hospitalization.  She follows with vascular surgeon as an outpatient. Please note that patient was admitted as inpatient however she has recovered faster than anticipated and thus she is being discharged after 1 midnight.  Discharge Diagnoses:  Principal Problem:   Shock circulatory ([place]) Active Problems:   HTN (hypertension)   Anxiety and depression   Acute kidney injury superimposed on CKD (HCC)   Narcotic addiction ([place])   Ruptured varicose vein   COPD (chronic obstructive pulmonary disease) (HCC)   Chronic diastolic CHF (congestive heart failure) [facility])    Discharge Instructions  Discharge Instructions    Discharge patient   Complete by: As directed    Discharge disposition: 01-Home or Self Care   Discharge patient date: 06/22/2019     Allergies as of 06/22/2019   No Known Allergies     Medication List    TAKE these medications   ALPRAZolam 1 MG tablet Commonly known as: XANAX as needed.   amLODipine 5 MG tablet Commonly known as: NORVASC Take 1 tablet (5 mg total) by mouth daily.   atorvastatin 10 MG tablet Commonly known as: LIPITOR TAKE 1 TABLET BY MOUTH EVERY DAY   buPROPion 150 MG 24 hr tablet Commonly known as: WELLBUTRIN XL Take 150 mg by mouth daily.   furosemide 20 MG tablet Commonly known as: LASIX Take 20 mg by mouth daily as needed for edema.   gabapentin 100 MG capsule Commonly known as: NEURONTIN Take 100 mg by mouth 3 (three) times daily.   hydrALAZINE 25 MG tablet Commonly known as: APRESOLINE Take 1 tablet (25 mg total) by mouth 3 (three) times daily.   losartan-hydrochlorothiazide 100-12.5 MG tablet Commonly known as: HYZAAR Take 1 tablet by mouth daily.   mirtazapine 30 MG tablet Commonly known as: REMERON 30 mg daily.   OxyCONTIN 60 MG  12 hr tablet Generic drug: oxyCODONE Take 1 tablet by mouth every 8 (eight) hours.   potassium chloride 10 MEQ tablet Commonly known as: KLOR-CON TAKE 1 TABLET (10 MEQ TOTAL) BY MOUTH DAILY. TAKE WITH LASIX   rOPINIRole 0.5 MG tablet Commonly known as: REQUIP Take 0.5 mg by mouth at bedtime as needed ("restlessness").      Follow-up Information    Scifres, Earlie Server, PA-C Follow up in 1 week(s).   Specialty: Physician Assistant Contact information: [place] [phone]          No Known Allergies  Consultations: None   Procedures/Studies: CT Head Wo Contrast  Result Date: 06/20/2019 CLINICAL DATA:  Headache. EXAM: CT HEAD WITHOUT CONTRAST TECHNIQUE: Contiguous axial images were obtained from the base of the skull  through the vertex without intravenous contrast. COMPARISON:  October 27, 2014 FINDINGS: Brain: There is mild cerebral atrophy with widening of the extra-axial spaces and ventricular dilatation. There are areas of decreased attenuation within the white matter tracts of the supratentorial brain, consistent with microvascular disease changes. A small chronic right basal ganglia lacunar infarct is seen. This is present on the prior study. Vascular: No hyperdense vessel or unexpected calcification. Skull: Normal. Negative for fracture or focal lesion. Sinuses/Orbits: No acute finding. Other: None. IMPRESSION: No acute intracranial pathology. Electronically Signed   By: Virgina Norfolk M.D.   On: 06/20/2019 21:06   CT Angio Chest PE W and/or Wo Contrast  Result Date: 06/21/2019 CLINICAL DATA:  Positive D-dimer.  Difficulty breathing. EXAM: CT ANGIOGRAPHY CHEST WITH CONTRAST TECHNIQUE: Multidetector CT imaging of the chest was performed using the standard protocol during bolus administration of intravenous contrast. Multiplanar CT image reconstructions and MIPs were obtained to evaluate the vascular anatomy. CONTRAST:  85mL OMNIPAQUE  IOHEXOL 350 MG/ML SOLN COMPARISON:  Chest radiograph 04/13/2015 FINDINGS: Cardiovascular: Normal heart size. Coronary arterial and thoracic aortic vascular calcifications. Main pulmonary artery is mildly dilated measuring 3.1 cm. Adequate opacification of the pulmonary arterial system. No intraluminal filling defects identified to suggest acute pulmonary embolus. Mediastinum/Nodes: No enlarged axillary, mediastinal or hilar lymphadenopathy. Lungs/Pleura: Extra tori phase imaging. Central airways are patent. Centrilobular emphysematous change. Minimal scattered atelectasis. Patchy consolidation within the left lower lobe. No pleural effusion or pneumothorax. Upper Abdomen: Unremarkable. Musculoskeletal: Thoracic spine degenerative changes. No aggressive or acute appearing osseous lesions. Old posterior right rib fractures. Bilateral shoulder arthroplasties. Review of the MIP images confirms the above findings. IMPRESSION: 1. No evidence for acute pulmonary embolus. 2. Aortic atherosclerosis.  Emphysema. 3. Patchy consolidation within the left lower lobe favored to represent atelectasis. Infection not excluded. Electronically Signed   By: Lovey Newcomer M.D.   On: 06/21/2019 04:05   DG Chest Portable 1 View  Result Date: 06/20/2019 CLINICAL DATA:  Shortness of breath. EXAM: PORTABLE CHEST 1 VIEW COMPARISON:  None. FINDINGS: There is no evidence of acute infiltrate, pleural effusion or pneumothorax. Small radiopaque surgical clips are seen overlying the right lung base. The cardiac silhouette is moderately enlarged. A radiopaque right shoulder replacement is noted. Chronic 6, 7 and 8 right-sided rib fractures are seen. Radiopaque pedicle screws are seen within the visualized portion of the lumbar spine. IMPRESSION: 1. No acute or active cardiopulmonary disease. 2. Stable cardiomegaly. 3. Intact right shoulder replacement. Electronically Signed   By: Virgina Norfolk M.D.   On: 06/20/2019 21:04   VAS [name] LOWER  EXTREMITY VENOUS (DVT) (ONLY [facility] & [facility] 7a-7p)  Result Date: 06/21/2019  Lower Venous Study Indications: Edema.  Risk Factors: History of venous insufficiency. Comparison Study: Prior study from 11/27/18 is available for comparison Performing Technologist: Sharion Dove RVS  Examination Guidelines: A complete evaluation includes B-mode imaging, spectral Doppler, color Doppler, and power Doppler as needed of all accessible portions of each vessel. Bilateral testing is considered an integral part of a complete examination. Limited examinations for reoccurring indications may be performed as noted.  +---------+---------------+---------+-----------+----------+--------------+ RIGHT    CompressibilityPhasicitySpontaneityPropertiesThrombus Aging +---------+---------------+---------+-----------+----------+--------------+ CFV      Full           Yes      Yes                                 +---------+---------------+---------+-----------+----------+--------------+ SFJ  Full                                                        +---------+---------------+---------+-----------+----------+--------------+ FV Prox  Full                                                        +---------+---------------+---------+-----------+----------+--------------+ FV Mid   Full                                                        +---------+---------------+---------+-----------+----------+--------------+ FV DistalFull                                                        +---------+---------------+---------+-----------+----------+--------------+ PFV      Full                                                        +---------+---------------+---------+-----------+----------+--------------+ POP      Full           Yes      Yes                                 +---------+---------------+---------+-----------+----------+--------------+ PTV      Full                                                         +---------+---------------+---------+-----------+----------+--------------+ PERO     Full                                                        +---------+---------------+---------+-----------+----------+--------------+   +---------+---------------+---------+-----------+----------+--------------+ LEFT     CompressibilityPhasicitySpontaneityPropertiesThrombus Aging +---------+---------------+---------+-----------+----------+--------------+ CFV      Full           Yes      Yes                                 +---------+---------------+---------+-----------+----------+--------------+ SFJ      Full                                                        +---------+---------------+---------+-----------+----------+--------------+  FV Prox  Full                                                        +---------+---------------+---------+-----------+----------+--------------+ FV Mid   Full                                                        +---------+---------------+---------+-----------+----------+--------------+ FV DistalFull                                                        +---------+---------------+---------+-----------+----------+--------------+ PFV      Full                                                        +---------+---------------+---------+-----------+----------+--------------+ POP      Full           Yes      Yes                                 +---------+---------------+---------+-----------+----------+--------------+ PTV      Full                                                        +---------+---------------+---------+-----------+----------+--------------+ PERO     Full                                                        +---------+---------------+---------+-----------+----------+--------------+     Summary: Right: Findings appear essentially unchanged compared to previous examination. There is no  evidence of deep vein thrombosis in the lower extremity. Intersitial fluid noted throughout calf Left: Findings appear essentially unchanged compared to previous examination. There is no evidence of deep vein thrombosis in the lower extremity. Intersitial fluid noted throughout calf  *See table(s) above for measurements and observations. Electronically signed by Servando Snare MD on 06/21/2019 at 8:39:46 AM.    Final       Discharge Exam: Vitals:   06/21/19 2141 06/22/19 0552  BP: 119/72 119/82  Pulse: 86 79  Resp: 17 (!) 22  Temp: 97.9 F (36.6 C) 98.5 F (36.9 C)  SpO2: 93% 96%   Vitals:   06/21/19 1506 06/21/19 2140 06/21/19 2141 06/22/19 0552  BP: (!) 156/79  119/72 119/82  Pulse: 97  86 79  Resp: 18  17 (!) 22  Temp: 98.6 F (37 C)  97.9 F (36.6 C) 98.5 F (36.9 C)  TempSrc: Oral  Oral  SpO2: 97% (!) 82% 93% 96%  Weight:      Height:        General: Pt is alert, awake, not in acute distress Cardiovascular: RRR, S1/S2 +, no rubs, no gallops Respiratory: CTA bilaterally, no wheezing, no rhonchi Abdominal: Soft, NT, ND, bowel sounds + Extremities: +2 pitting edema bilateral lower extremity, no cyanosis    The results of significant diagnostics from this hospitalization (including imaging, microbiology, ancillary and laboratory) are listed below for reference.     Microbiology: Recent Results (from the past 240 hour(s))  Blood culture (routine x 2)     Status: None (Preliminary result)   Collection Time: 06/20/19  9:22 PM   Specimen: BLOOD  Result Value Ref Range Status   Specimen Description BLOOD LEFT ANTECUBITAL  Final   Special Requests   Final    BOTTLES DRAWN AEROBIC AND ANAEROBIC Blood Culture adequate volume   Culture   Final    NO GROWTH < 24 HOURS Performed at [facility], [address], [place], [place] [address]    Report Status PENDING  Incomplete  Blood culture (routine x 2)     Status: None (Preliminary result)   Collection Time:  06/20/19  9:42 PM   Specimen: BLOOD  Result Value Ref Range Status   Specimen Description BLOOD RIGHT ANTECUBITAL  Final   Special Requests   Final    BOTTLES DRAWN AEROBIC AND ANAEROBIC Blood Culture adequate volume   Culture   Final    NO GROWTH < 24 HOURS Performed at [facility], [place] [address], [place], [place] [address]    Report Status PENDING  Incomplete  Respiratory Panel by RT PCR (Flu A&B, Covid) - Nasopharyngeal Swab     Status: None   Collection Time: 06/21/19 12:46 AM   Specimen: Nasopharyngeal Swab  Result Value Ref Range Status   SARS Coronavirus 2 by RT PCR NEGATIVE NEGATIVE Final    Comment: (NOTE) SARS-CoV-2 target nucleic acids are NOT DETECTED. The SARS-CoV-2 RNA is generally detectable in upper respiratoy specimens during the acute phase of infection. The lowest concentration of SARS-CoV-2 viral copies this assay can detect is 131 copies/mL. A negative result does not preclude SARS-Cov-2 infection and should not be used as the sole basis for treatment or other patient management decisions. A negative result may occur with  improper specimen collection/handling, submission of specimen other than nasopharyngeal swab, presence of viral mutation(s) within the areas targeted by this assay, and inadequate number of viral copies (<131 copies/mL). A negative result must be combined with clinical observations, patient history, and epidemiological information. The expected result is Negative. Fact Sheet for Patients:  PinkCheek.be Fact Sheet for Healthcare Providers:  GravelBags.it This test is not yet ap proved or cleared by the [place] FDA and  has been authorized for detection and/or diagnosis of SARS-CoV-2 by FDA under an Emergency Use Authorization (EUA). This EUA will remain  in effect (meaning this test can be used) for the duration of the COVID-19 declaration under Section 564(b)(1) of the  Act, 21 U.S.C. section 360bbb-3(b)(1), unless the authorization is terminated or revoked sooner.    Influenza A by PCR NEGATIVE NEGATIVE Final   Influenza B by PCR NEGATIVE NEGATIVE Final    Comment: (NOTE) The Xpert Xpress SARS-CoV-2/FLU/RSV assay is intended as an aid in  the diagnosis of influenza from Nasopharyngeal swab specimens and  should not be used as a sole basis for treatment. Nasal washings and  aspirates are unacceptable for Xpert Xpress SARS-CoV-2/FLU/RSV  testing. Fact Sheet for Patients: PinkCheek.be Fact Sheet for Healthcare Providers: GravelBags.it This test is not yet approved or cleared by the [place] FDA and  has been authorized for detection and/or diagnosis of SARS-CoV-2 by  FDA under an Emergency Use Authorization (EUA). This EUA will remain  in effect (meaning this test can be used) for the duration of the  Covid-19 declaration under Section 564(b)(1) of the Act, 21  U.S.C. section 360bbb-3(b)(1), unless the authorization is  terminated or revoked. Performed at [facility], [place] [address], [place], [place] [address]   Urine culture     Status: None   Collection Time: 06/21/19  3:10 AM   Specimen: In/Out Cath Urine  Result Value Ref Range Status   Specimen Description IN/OUT CATH URINE  Final   Special Requests NONE  Final   Culture   Final    NO GROWTH Performed at [facility], [place] [address], [place], [place] [address]    Report Status 06/22/2019 FINAL  Final     Labs: BNP (last 3 results) Recent Labs    10/28/18 1618 06/20/19 2132  BNP 56.1 87.5   Basic Metabolic Panel: Recent Labs  Lab 06/20/19 2022 06/20/19 2027 06/22/19 0349  NA 138 139 139  K 4.0 4.1 4.6  CL  --  101 104  CO2  --  29 28  GLUCOSE  --  101* 81  BUN  --  28* 26*  CREATININE  --  1.53* 1.24*  CALCIUM  --  10.5* 9.3   Liver Function Tests: Recent Labs  Lab 06/20/19 2027  AST 27   ALT 18  ALKPHOS 90  BILITOT 0.3  PROT 6.6  ALBUMIN 3.5   No results for input(s): LIPASE, AMYLASE in the last 168 hours. No results for input(s): AMMONIA in the last 168 hours. CBC: Recent Labs  Lab 06/20/19 2022 06/20/19 2027 06/22/19 0349  WBC  --  3.7* 3.4*  NEUTROABS  --  2.1  --   HGB 10.9* 10.3* 9.1*  HCT 32.0* 33.0* 29.3*  MCV  --  93.2 93.3  PLT  --  211 172   Cardiac Enzymes: No results for input(s): CKTOTAL, CKMB, CKMBINDEX, TROPONINI in the last 168 hours. BNP: Invalid input(s): POCBNP CBG: No results for input(s): GLUCAP in the last 168 hours. D-Dimer Recent Labs    06/20/19 2226  DDIMER 0.56*   Hgb A1c No results for input(s): HGBA1C in the last 72 hours. Lipid Profile No results for input(s): CHOL, HDL, LDLCALC, TRIG, CHOLHDL, LDLDIRECT in the last 72 hours. Thyroid function studies Recent Labs    06/21/19 0050  TSH 1.221   Anemia work up No results for input(s): VITAMINB12, FOLATE, FERRITIN, TIBC, IRON, RETICCTPCT in the last 72 hours. Urinalysis    Component Value Date/Time   COLORURINE STRAW (A) 06/21/2019 0310   APPEARANCEUR CLEAR 06/21/2019 0310   LABSPEC 1.008 06/21/2019 0310   PHURINE 5.0 06/21/2019 0310   GLUCOSEU NEGATIVE 06/21/2019 0310   HGBUR NEGATIVE 06/21/2019 0310   BILIRUBINUR NEGATIVE 06/21/2019 0310   KETONESUR NEGATIVE 06/21/2019 0310   PROTEINUR NEGATIVE 06/21/2019 0310   UROBILINOGEN 0.2 11/05/2014 0437   NITRITE NEGATIVE 06/21/2019 0310   LEUKOCYTESUR NEGATIVE 06/21/2019 0310   Sepsis Labs Invalid input(s): PROCALCITONIN,  WBC,  LACTICIDVEN Microbiology Recent Results (from the past 240 hour(s))  Blood culture (routine x 2)     Status: None (Preliminary result)  Collection Time: 06/20/19  9:22 PM   Specimen: BLOOD  Result Value Ref Range Status   Specimen Description BLOOD LEFT ANTECUBITAL  Final   Special Requests   Final    BOTTLES DRAWN AEROBIC AND ANAEROBIC Blood Culture adequate volume   Culture    Final    NO GROWTH < 24 HOURS Performed at [facility], [place] [address], [place], [place] [address]    Report Status PENDING  Incomplete  Blood culture (routine x 2)     Status: None (Preliminary result)   Collection Time: 06/20/19  9:42 PM   Specimen: BLOOD  Result Value Ref Range Status   Specimen Description BLOOD RIGHT ANTECUBITAL  Final   Special Requests   Final    BOTTLES DRAWN AEROBIC AND ANAEROBIC Blood Culture adequate volume   Culture   Final    NO GROWTH < 24 HOURS Performed at [facility], [place] [address] [place], [place] [address]    Report Status PENDING  Incomplete  Respiratory Panel by RT PCR (Flu A&B, Covid) - Nasopharyngeal Swab     Status: None   Collection Time: 06/21/19 12:46 AM   Specimen: Nasopharyngeal Swab  Result Value Ref Range Status   SARS Coronavirus 2 by RT PCR NEGATIVE NEGATIVE Final    Comment: (NOTE) SARS-CoV-2 target nucleic acids are NOT DETECTED. The SARS-CoV-2 RNA is generally detectable in upper respiratoy specimens during the acute phase of infection. The lowest concentration of SARS-CoV-2 viral copies this assay can detect is 131 copies/mL. A negative result does not preclude SARS-Cov-2 infection and should not be used as the sole basis for treatment or other patient management decisions. A negative result may occur with  improper specimen collection/handling, submission of specimen other than nasopharyngeal swab, presence of viral mutation(s) within the areas targeted by this assay, and inadequate number of viral copies (<131 copies/mL). A negative result must be combined with clinical observations, patient history, and epidemiological information. The expected result is Negative. Fact Sheet for Patients:  PinkCheek.be Fact Sheet for Healthcare Providers:  GravelBags.it This test is not yet ap proved or cleared by the [place] FDA and  has been authorized for  detection and/or diagnosis of SARS-CoV-2 by FDA under an Emergency Use Authorization (EUA). This EUA will remain  in effect (meaning this test can be used) for the duration of the COVID-19 declaration under Section 564(b)(1) of the Act, 21 U.S.C. section 360bbb-3(b)(1), unless the authorization is terminated or revoked sooner.    Influenza A by PCR NEGATIVE NEGATIVE Final   Influenza B by PCR NEGATIVE NEGATIVE Final    Comment: (NOTE) The Xpert Xpress SARS-CoV-2/FLU/RSV assay is intended as an aid in  the diagnosis of influenza from Nasopharyngeal swab specimens and  should not be used as a sole basis for treatment. Nasal washings and  aspirates are unacceptable for Xpert Xpress SARS-CoV-2/FLU/RSV  testing. Fact Sheet for Patients: PinkCheek.be Fact Sheet for Healthcare Providers: GravelBags.it This test is not yet approved or cleared by the [place] FDA and  has been authorized for detection and/or diagnosis of SARS-CoV-2 by  FDA under an Emergency Use Authorization (EUA). This EUA will remain  in effect (meaning this test can be used) for the duration of the  Covid-19 declaration under Section 564(b)(1) of the Act, 21  U.S.C. section 360bbb-3(b)(1), unless the authorization is  terminated or revoked. Performed at [facility], [place] [address], [place], [place] [address]   Urine culture  Status: None   Collection Time: 06/21/19  3:10 AM   Specimen: In/Out Cath Urine  Result Value Ref Range Status   Specimen Description IN/OUT CATH URINE  Final   Special Requests NONE  Final   Culture   Final    NO GROWTH Performed at [facility], [place] [address], [place], [place] [address]    Report Status 06/22/2019 FINAL  Final     Time coordinating discharge: Over 30 minutes  SIGNED:   Darliss Cheney, MD  [facility] 06/22/2019, 10:40 AM  If 7PM-7AM, please contact  night-coverage [email or web address] Password [email or web address]

## 2019-06-23 ENCOUNTER — Telehealth: Payer: Self-pay

## 2019-06-23 ENCOUNTER — Encounter: Payer: Self-pay | Admitting: Cardiology

## 2019-06-23 ENCOUNTER — Other Ambulatory Visit: Payer: Self-pay

## 2019-06-23 ENCOUNTER — Ambulatory Visit (INDEPENDENT_AMBULATORY_CARE_PROVIDER_SITE_OTHER): Payer: Medicare Other | Admitting: Cardiology

## 2019-06-23 VITALS — BP 148/67 | HR 87 | Temp 97.8°F | Resp 16 | Ht 60.0 in | Wt 133.0 lb

## 2019-06-23 DIAGNOSIS — I83892 Varicose veins of left lower extremities with other complications: Secondary | ICD-10-CM

## 2019-06-23 DIAGNOSIS — R001 Bradycardia, unspecified: Secondary | ICD-10-CM | POA: Diagnosis not present

## 2019-06-23 DIAGNOSIS — R5383 Other fatigue: Secondary | ICD-10-CM | POA: Diagnosis not present

## 2019-06-23 NOTE — Progress Notes (Signed)
Primary Physician/Referring:  Maude Leriche, PA-C  Patient ID: [name], female    DOB: Apr 28, 1946, 74 y.o.   MRN: [id number]  Chief complaints: hospital follow up  HPI: [name]  is a 74 y.o. female  with peripheral artery disease, hypertension, COPD,Stage III chronic kidney disease, history of breast cancer. She has had bilateral venous insufficiency and has had ablation in the past, former tobacco use since December 2019.    She underwent peripheral arteriogram on 03/18/2019 and left SFA had a long CTO for which she had failed attempt at angioplasty.  However best three-vessel runoff was noted below the knee and it is best felt that to leave the lesion alone.   She was recently seen for weakness and fatigue after having ruptured varicose veins. Noted to be markedly bradycardic, Metoprolol was held. Due to her persistent symptoms, she was evaluated in ER on 01/29 for hypothermia and hypotensive, felt to be circulatory shock. Symptoms improved and she was discharged on 01/31. She now presents for follow up.   She is feeling much better. Her friend, Anderson Malta, is present at bedside and has noted significant difference since being out of the hospital. She has not had any further dizziness and energy levels have improved. She continues to have leg swelling that has overall been stable. She has had some bleeding today from her LLE lesion, but was able to get the bleeding to stop quickly. She has pressure dressing applied.    Past Medical History:  Diagnosis Date  . Acute renal failure superimposed on stage 3 chronic kidney disease ([place]) 10/27/2014  . Back pain, chronic   . Breast cancer ([place]) 09/09/10  . Cancer of colon ([place]) 05/24/2011  . Centrilobular emphysema ([place]) 07/15/2018  . CKD (chronic kidney disease), stage III   . Claudication, intermittent ([place]) 07/15/2018  . Depression   . GIB (gastrointestinal bleeding)   . H/O tobacco use, presenting hazards to health Quit Dec  2019 05/03/2011   50 years, up to 3PPD quit last year.   Marland Kitchen Hx of varicose veins of lower extremity 07/15/2018  . Hypertension   . SDH (subdural hematoma) ([place])   . SOB (shortness of breath) 07/15/2018    Past Surgical History:  Procedure Laterality Date  . ABDOMINAL AORTOGRAM W/LOWER EXTREMITY Left 03/18/2019   Procedure: ABDOMINAL AORTOGRAM W/LOWER EXTREMITY;  Surgeon: Adrian Prows, MD;  Location: [facility];  Service: Cardiovascular;  Laterality: Left;  . BACK SURGERY    . BREAST LUMPECTOMY  10/10/2010   Rt Breast  . COLON SURGERY  04/2011  . COLONOSCOPY  05/03/2011   Procedure: COLONOSCOPY;  Surgeon: Jeryl Columbia, MD;  Location[facility];  Service: Endoscopy;  Laterality: N/A;  . JOINT REPLACEMENT     R&L total shoulder replacements  . LUMBAR DISC SURGERY    . PARTIAL HYSTERECTOMY    . PERIPHERAL VASCULAR INTERVENTION  03/18/2019   Procedure: PERIPHERAL VASCULAR INTERVENTION;  Surgeon: Adrian Prows, MD;  Location: [facility];  Service: Cardiovascular;;  attempted left SFA  . SHOULDER SURGERY    . TUMOR REMOVAL  Tumor removed R breast    Social History   Socioeconomic History  . Marital status: Divorced    Spouse name: Not on file  . Number of children: 2  . Years of education: Not on file  . Highest education level: Not on file  Occupational History  . Not on file  Tobacco Use  . Smoking status: Former Smoker  Packs/day: 0.75    Years: 30.00    Pack years: 22.50    Types: Cigarettes    Quit date: 2020    Years since quitting: 1.0  . Smokeless tobacco: Former Systems developer    Quit date: 04/22/2010  Substance and Sexual Activity  . Alcohol use: Yes    Comment: rarely   . Drug use: No  . Sexual activity: Not Currently  Other Topics Concern  . Not on file  Social History Narrative   Lives alone in a one story home.  Has 2 children.     On disability for low back pain since the age of 70.     Education: high school.   Social Determinants of Health    Financial Resource Strain:   . Difficulty of Paying Living Expenses: Not on file  Food Insecurity:   . Worried About Charity fundraiser in the Last Year: Not on file  . Ran Out of Food in the Last Year: Not on file  Transportation Needs:   . Lack of Transportation (Medical): Not on file  . Lack of Transportation (Non-Medical): Not on file  Physical Activity:   . Days of Exercise per Week: Not on file  . Minutes of Exercise per Session: Not on file  Stress:   . Feeling of Stress : Not on file  Social Connections:   . Frequency of Communication with Friends and Family: Not on file  . Frequency of Social Gatherings with Friends and Family: Not on file  . Attends Religious Services: Not on file  . Active Member of Clubs or Organizations: Not on file  . Attends Archivist Meetings: Not on file  . Marital Status: Not on file  Intimate Partner Violence:   . Fear of Current or Ex-Partner: Not on file  . Emotionally Abused: Not on file  . Physically Abused: Not on file  . Sexually Abused: Not on file    Review of Systems  Constitution: Positive for malaise/fatigue. Negative for decreased appetite, weight gain and weight loss.  Eyes: Negative for visual disturbance.  Cardiovascular: Positive for claudication, dyspnea on exertion and leg swelling. Negative for chest pain, orthopnea, palpitations and syncope.  Respiratory: Negative for hemoptysis and wheezing.   Endocrine: Negative for cold intolerance and heat intolerance.  Hematologic/Lymphatic: Does not bruise/bleed easily.  Skin: Negative for nail changes and poor wound healing.  Musculoskeletal: Positive for arthritis and back pain. Negative for muscle weakness and myalgias.  Gastrointestinal: Negative for abdominal pain, change in bowel habit, nausea and vomiting.  Neurological: Negative for difficulty with concentration, dizziness, focal weakness and headaches.  Psychiatric/Behavioral: Negative for altered mental  status and suicidal ideas.  All other systems reviewed and are negative.     Objective  Blood pressure (!) 148/67, pulse 87, temperature 97.8 F (36.6 C), temperature source Temporal, resp. rate 16, height 5' (1.524 m), weight 133 lb (60.3 kg), SpO2 95 %. Body mass index is 25.97 kg/m.    Physical Exam  Constitutional: She is oriented to person, place, and time. Vital signs are normal. She appears well-developed and well-nourished.  HENT:  Head: Normocephalic and atraumatic.  Cardiovascular: Normal rate, regular rhythm, normal heart sounds and intact distal pulses.  Pulses:      Femoral pulses are 2+ on the right side with bruit.      Popliteal pulses are 1+ on the right side and 1+ on the left side.       Dorsalis pedis pulses are 1+  on the right side and 0 on the left side.       Posterior tibial pulses are 1+ on the right side and 0 on the left side.  Mild Erythema bilateral R>L. Left medial malleolus shows a small scar like lesion that does not appear ischemia. No discharge.  No leg edema  Pulmonary/Chest: Effort normal and breath sounds normal. No accessory muscle usage. No respiratory distress.  Abdominal: Soft. Bowel sounds are normal.  Musculoskeletal:        General: Normal range of motion.     Cervical back: Normal range of motion.  Neurological: She is alert and oriented to person, place, and time.  Skin: Skin is warm and dry. No erythema.  Vitals reviewed.  Radiology: No results found.  Laboratory examination:    CMP Latest Ref Rng & Units 06/22/2019 06/20/2019 06/20/2019  Glucose 70 - 99 mg/dL 81 101(H) -  BUN 8 - 23 mg/dL 26(H) 28(H) -  Creatinine 0.44 - 1.00 mg/dL 1.24(H) 1.53(H) -  Sodium 135 - 145 mmol/L 139 139 138  Potassium 3.5 - 5.1 mmol/L 4.6 4.1 4.0  Chloride 98 - 111 mmol/L 104 101 -  CO2 22 - 32 mmol/L 28 29 -  Calcium 8.9 - 10.3 mg/dL 9.3 10.5(H) -  Total Protein 6.5 - 8.1 g/dL - 6.6 -  Total Bilirubin 0.3 - 1.2 mg/dL - 0.3 -  Alkaline Phos 38 -  126 U/L - 90 -  AST 15 - 41 U/L - 27 -  ALT 0 - 44 U/L - 18 -   CBC Latest Ref Rng & Units 06/22/2019 06/20/2019 06/20/2019  WBC 4.0 - 10.5 K/uL 3.4(L) 3.7(L) -  Hemoglobin 12.0 - 15.0 g/dL 9.1(L) 10.3(L) 10.9(L)  Hematocrit 36.0 - 46.0 % 29.3(L) 33.0(L) 32.0(L)  Platelets 150 - 400 K/uL 172 211 -   Lipid Panel     Component Value Date/Time   CHOL 132 06/12/2019 1502   TRIG 62 06/12/2019 1502   HDL 73 06/12/2019 1502   CHOLHDL 1.8 06/12/2019 1502   LDLCALC 46 06/12/2019 1502   HEMOGLOBIN A1C Lab Results  Component Value Date   HGBA1C 6.1 (H) 10/28/2014   MPG 128 10/28/2014   TSH Recent Labs    06/21/19 0050  TSH 1.221    PRN Meds:. There are no discontinued medications. Current Meds  Medication Sig  . ALPRAZolam (XANAX) 1 MG tablet as needed.   Marland Kitchen atorvastatin (LIPITOR) 10 MG tablet TAKE 1 TABLET BY MOUTH EVERY DAY (Patient taking differently: Take 10 mg by mouth daily. )  . buPROPion (WELLBUTRIN XL) 150 MG 24 hr tablet Take 150 mg by mouth daily.  . furosemide (LASIX) 20 MG tablet Take 20 mg by mouth daily as needed for edema.   . gabapentin (NEURONTIN) 100 MG capsule Take 100 mg by mouth 3 (three) times daily.  . hydrALAZINE (APRESOLINE) 25 MG tablet Take 1 tablet (25 mg total) by mouth 3 (three) times daily.  Marland Kitchen losartan-hydrochlorothiazide (HYZAAR) 100-12.5 MG tablet Take 1 tablet by mouth daily.  . mirtazapine (REMERON) 30 MG tablet 30 mg daily.   Marland Kitchen oxyCODONE (OXYCONTIN) 60 MG 12 hr tablet Take 1 tablet by mouth every 8 (eight) hours.  . potassium chloride (K-DUR) 10 MEQ tablet TAKE 1 TABLET (10 MEQ TOTAL) BY MOUTH DAILY. TAKE WITH LASIX  . rOPINIRole (REQUIP) 0.5 MG tablet Take 0.5 mg by mouth at bedtime as needed ("restlessness").     Cardiac Studies:   01/11/2015: Hospital Lower extremity arterial duplex  Evidence of multilevel arterial occlusive disease bilaterally, left greater than right. Resting ankle-brachial index is mildly depressed on the right at 0.85.  The left ABI is moderately depressed at rest at 0.65. On the right side, there likely is a component of aortoiliac inflow disease given monophasic waveforms throughout.  Echocardiogram 11/26/2018: Normal LV systolic function with EF 57%. Left ventricle cavity is normal in size. Normal global wall motion. Doppler evidence of grade I (impaired) diastolic dysfunction, normal LAP. Calculated EF 57%. Left atrial cavity is mildly dilated. Mild (Grade I) mitral regurgitation. Mild to moderate tricuspid regurgitation. Estimated pulmonary artery systolic pressure is 40-98 mmHg. IVC is dilated with blunted respiratory response. Estimated RA pressure 10-15 mmHg.  Lower Extremity Arterial Duplex 01/14/2019: There is monophasic waveform noted in the right external iliac and CFA suggests proximal significant disease. There is severe diffuse mixed plaque noted throughout the right lower extremity.  Moderate velocity increase at the left distal superficial femoral artery, >50% stenosis. There is severe diffuse mixed plaque throughout the left lower extremity This exam reveals moderately decreased perfusion of the right lower extremity, noted at the dorsalis pedis artery level (ABI 0.75) and moderately decreased perfusion of the left lower extremity, noted at the post tibial artery level (ABI 0.58).  No significant change from report of 01/11/2015.   Peripheral arteriogram 03/18/2019:  Dist R EIA to Prox R CFA lesion is 60% stenosed.  Prox R CIA to Mid R EIA lesion is 20% stenosed.  Prox L CIA to Mid L CFA lesion is 20% stenosed.  Prox L SFA to Dist L SFA lesion is 100% stenosed.  Three-vessel runoff below the knee on the left with brisk  flow.  Failed attempt at angioplasty of the left SFA.   Suprarenal Abd AO to Infrarenal Abd AO lesion is 30% stenosed. Severely calcified lesions.  Abdominal aortogram also reveals patent renal arteries 1 on either side. There is no evidence of abdominal aortic  aneurysm.   Assessment     ICD-10-CM   1. Bleeding from varicose veins of left lower extremity  I83.892 CBC    Basic metabolic panel  2. Bradycardia  R00.1   3. Fatigue, unspecified type  R53.83     EKG 06/16/2019: Marked sinus bradycardia at 45 bpm, norma axis, IRBBB  Recommendations:   I reviewed records and lab results performed during her hospitalization.  She has had significant improvement in her symptoms since being discharged.  Suspect potentially contributed by blood loss from bleeding varicose vein and also dehydration.  I have encouraged her to drink 1500 mL of water daily. Will need to also be careful to not over hydrate in view of her issues with leg swelling.  Since being off metoprolol bradycardia has resolved.  Her blood pressure is slightly elevated, but in view of her recent episodes we will not make medication changes today.  She has significantly improved energy on exam today without dizziness.  She does have mild leg edema, encouraged them to continue to use Lasix as needed and to keep her legs elevated to help with her venous insufficiency.  She has upcoming appointment with vascular surgery next week for evaluation of her venous insufficiency and bleeding from varicosity.  Encouraged him to continue with keeping a pressure dressing applied to the site to prevent any recurrence of bleeding.  We will continue to be off Plavix and aspirin for now.  She will need reevaluation of CBC as well as BMP that can be performed before her next  office visit.  Plan of care was discussed with her friend, Anderson Malta, who is agreeable.  We will plan to see her back in 4 weeks for follow-up, but encouraged her to contact me sooner if needed.    Miquel Dunn, MSN, APRN, FNP-C [facility]. [place] Office: [phone] Fax: [phone]

## 2019-06-24 ENCOUNTER — Telehealth: Payer: Self-pay

## 2019-06-24 NOTE — Telephone Encounter (Signed)
Because of swelling? She didn't specifically mention leg pain yesterday. She can take an extra dose of her lasix today if needed. If due to that scabbed spot that she had bleeding, there isn't anything that I can do about that until she gets to see vascular. Elevate her legs will help.

## 2019-06-24 NOTE — Telephone Encounter (Signed)
Pt called and left a vm that she is still in leg pain more then yesterday. Please advise thank you

## 2019-06-24 NOTE — Telephone Encounter (Signed)
Called pt no answer and could not leave a vm 

## 2019-06-25 ENCOUNTER — Encounter: Payer: Self-pay | Admitting: Cardiology

## 2019-06-25 LAB — CULTURE, BLOOD (ROUTINE X 2)
Culture: NO GROWTH
Culture: NO GROWTH
Special Requests: ADEQUATE
Special Requests: ADEQUATE

## 2019-06-27 NOTE — Telephone Encounter (Signed)
error 

## 2019-06-27 NOTE — Progress Notes (Signed)
Error

## 2019-07-02 ENCOUNTER — Other Ambulatory Visit: Payer: Self-pay

## 2019-07-02 ENCOUNTER — Ambulatory Visit (INDEPENDENT_AMBULATORY_CARE_PROVIDER_SITE_OTHER): Payer: Medicare Other | Admitting: Vascular Surgery

## 2019-07-02 ENCOUNTER — Ambulatory Visit (HOSPITAL_COMMUNITY)
Admission: RE | Admit: 2019-07-02 | Discharge: 2019-07-02 | Disposition: A | Discharge status: Home / Self Care | Payer: Medicare Other | Source: Ambulatory Visit | Attending: Vascular Surgery | Admitting: Vascular Surgery

## 2019-07-02 ENCOUNTER — Encounter: Payer: Self-pay | Admitting: Vascular Surgery

## 2019-07-02 VITALS — BP 125/71 | HR 80 | Temp 98.3°F | Resp 18 | Ht 60.0 in | Wt 133.0 lb

## 2019-07-02 DIAGNOSIS — I83813 Varicose veins of bilateral lower extremities with pain: Secondary | ICD-10-CM | POA: Diagnosis not present

## 2019-07-02 DIAGNOSIS — I83893 Varicose veins of bilateral lower extremities with other complications: Secondary | ICD-10-CM | POA: Insufficient documentation

## 2019-07-02 DIAGNOSIS — I739 Peripheral vascular disease, unspecified: Secondary | ICD-10-CM | POA: Diagnosis not present

## 2019-07-02 NOTE — Progress Notes (Signed)
Referring Physician: Binnie Kand PA  Patient name: [name] MRN: [id number] DOB: 02-18-1946 Sex: female  REASON FOR CONSULT: Bleeding varicose vein  HPI: [name] is a 74 y.o. female, who had a recent episode of bleeding from a varicose vein in her left leg.  She apparently has had at least 4 prior episodes in the last year of bleeding from this varicosity.  She also has swelling in both legs.  When she has had an episode of bleeding it usually stops with direct pressure but she says sometimes this can take several hours.  She previously had had a stripping of her left greater saphenous vein many years ago.  Of note the patient also has a history of peripheral arterial disease and was recently noted on arteriogram by Dr. 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  She currently smokes 1 pack of cigarettes per day.  Greater than 3 minutes today spent regarding smoking cessation counseling.    Other medical problems include CKD 3, claudication, hypertension, shortness of breath with exertion all of which are currently stable.  Past Medical History:  Diagnosis Date  . Acute renal failure superimposed on stage 3 chronic kidney disease ([place]) 10/27/2014  . Back pain, chronic   . Breast cancer ([place]) 09/09/10  . Cancer of colon ([place]) 05/24/2011  . Centrilobular emphysema ([place]) 07/15/2018  . CKD (chronic kidney disease), stage III   . Claudication, intermittent ([place]) 07/15/2018  . Depression   . GIB (gastrointestinal bleeding)   . H/O tobacco use, presenting hazards to health Quit Dec 2019 05/03/2011   50 years, up to 3PPD quit last year.   Marland Kitchen Hx of varicose veins of lower extremity 07/15/2018  . Hypertension   . SDH (subdural hematoma) ([place])   . SOB (shortness of breath) 07/15/2018   Past Surgical History:  Procedure Laterality Date  . ABDOMINAL AORTOGRAM W/LOWER EXTREMITY Left 03/18/2019   Procedure: ABDOMINAL  AORTOGRAM W/LOWER EXTREMITY;  Surgeon: Adrian Prows, MD;  Location: [facility];  Service: Cardiovascular;  Laterality: Left;  . BACK SURGERY    . BREAST LUMPECTOMY  10/10/2010   Rt Breast  . COLON SURGERY  04/2011  . COLONOSCOPY  05/03/2011   Procedure: COLONOSCOPY;  Surgeon: Jeryl Columbia, MD;  Location: [facility]COPY;  Service: Endoscopy;  Laterality: N/A;  . JOINT REPLACEMENT     R&L total shoulder replacements  . LUMBAR DISC SURGERY    . PARTIAL HYSTERECTOMY    . PERIPHERAL VASCULAR INTERVENTION  03/18/2019   Procedure: PERIPHERAL VASCULAR INTERVENTION;  Surgeon: Adrian Prows, MD;  Location: [facility];  Service: Cardiovascular;;  attempted left SFA  . SHOULDER SURGERY    . TUMOR REMOVAL  Tumor removed R breast    Family History  Problem Relation Age of Onset  . Diabetes Mother   . Hypertension Mother   . Cancer Mother        leukemia  . Sudden death Mother   . Heart attack Mother   . Anesthesia problems Neg Hx   . Hypotension Neg Hx   . Malignant hyperthermia Neg Hx   . Pseudochol deficiency Neg Hx   . Breast cancer Neg Hx     SOCIAL HISTORY: Social History   Socioeconomic History  . Marital status: Divorced    Spouse name: Not on file  . Number of children: 2  . Years of education: Not on file  .  Highest education level: Not on file  Occupational History  . Not on file  Tobacco Use  . Smoking status: Current Every Day Smoker    Packs/day: 0.25    Years: 30.00    Pack years: 7.50    Types: Cigarettes  . Smokeless tobacco: Former Systems developer    Quit date: 04/22/2010  Substance and Sexual Activity  . Alcohol use: Yes    Comment: rarely   . Drug use: No  . Sexual activity: Not Currently  Other Topics Concern  . Not on file  Social History Narrative   Lives alone in a one story home.  Has 2 children.     On disability for low back pain since the age of 76.     Education: high school.   Social Determinants of Health   Financial Resource Strain:   .  Difficulty of Paying Living Expenses: Not on file  Food Insecurity:   . Worried About Charity fundraiser in the Last Year: Not on file  . Ran Out of Food in the Last Year: Not on file  Transportation Needs:   . Lack of Transportation (Medical): Not on file  . Lack of Transportation (Non-Medical): Not on file  Physical Activity:   . Days of Exercise per Week: Not on file  . Minutes of Exercise per Session: Not on file  Stress:   . Feeling of Stress : Not on file  Social Connections:   . Frequency of Communication with Friends and Family: Not on file  . Frequency of Social Gatherings with Friends and Family: Not on file  . Attends Religious Services: Not on file  . Active Member of Clubs or Organizations: Not on file  . Attends Archivist Meetings: Not on file  . Marital Status: Not on file  Intimate Partner Violence:   . Fear of Current or Ex-Partner: Not on file  . Emotionally Abused: Not on file  . Physically Abused: Not on file  . Sexually Abused: Not on file    No Known Allergies  Current Outpatient Medications  Medication Sig Dispense Refill  . ALPRAZolam (XANAX) 1 MG tablet as needed.     Marland Kitchen atorvastatin (LIPITOR) 10 MG tablet TAKE 1 TABLET BY MOUTH EVERY DAY (Patient taking differently: Take 10 mg by mouth daily. ) 90 tablet 1  . buPROPion (WELLBUTRIN XL) 150 MG 24 hr tablet Take 150 mg by mouth daily.    . furosemide (LASIX) 20 MG tablet Take 20 mg by mouth daily as needed for edema.     . gabapentin (NEURONTIN) 100 MG capsule Take 100 mg by mouth 3 (three) times daily.    . hydrALAZINE (APRESOLINE) 25 MG tablet Take 1 tablet (25 mg total) by mouth 3 (three) times daily. 270 tablet 3  . losartan-hydrochlorothiazide (HYZAAR) 100-12.5 MG tablet Take 1 tablet by mouth daily. 90 tablet 3  . mirtazapine (REMERON) 30 MG tablet 30 mg daily.     Marland Kitchen oxyCODONE (OXYCONTIN) 60 MG 12 hr tablet Take 1 tablet by mouth every 8 (eight) hours.    . potassium chloride (K-DUR) 10  MEQ tablet TAKE 1 TABLET (10 MEQ TOTAL) BY MOUTH DAILY. TAKE WITH LASIX 90 tablet 1  . rOPINIRole (REQUIP) 0.5 MG tablet Take 0.5 mg by mouth at bedtime as needed ("restlessness").     Marland Kitchen amLODipine (NORVASC) 5 MG tablet Take 1 tablet (5 mg total) by mouth daily. 180 tablet 3   No current facility-administered medications for this visit.  ROS:   General:  No weight loss, Fever, chills  HEENT: No recent headaches, no nasal bleeding, no visual changes, no sore throat  Neurologic: No dizziness, blackouts, seizures. No recent symptoms of stroke or mini- stroke. No recent episodes of slurred speech, or temporary blindness.  Cardiac: No recent episodes of chest pain/pressure, no shortness of breath at rest.  + shortness of breath with exertion.  Denies history of atrial fibrillation or irregular heartbeat  Vascular: No history of rest pain in feet.  No history of claudication.  No history of non-healing ulcer, No history of DVT   Pulmonary: No home oxygen, no productive cough, no hemoptysis,  No asthma or wheezing  Musculoskeletal:  [ ]  Arthritis, [ ]  Low back pain,  [ ]  Joint pain  Hematologic:No history of hypercoagulable state.  No history of easy bleeding.  No history of anemia  Gastrointestinal: No hematochezia or melena,  No gastroesophageal reflux, no trouble swallowing  Urinary: [X]  chronic Kidney disease, [ ]  on HD - [ ]  MWF or [ ]  TTHS, [ ]  Burning with urination, [ ]  Frequent urination, [ ]  Difficulty urinating;   Skin: No rashes  Psychological: No history of anxiety,  No history of depression   Physical Examination  Vitals:   07/02/19 1322  BP: 125/71  Pulse: 80  Resp: 18  Temp: 98.3 F (36.8 C)  TempSrc: Temporal  SpO2: 92%  Weight: 133 lb (60.3 kg)  Height: 5' (1.524 m)    Body mass index is 25.97 kg/m.  General:  Alert and oriented, no acute distress HEENT: Normal Neck: No JVD Pulmonary: Clear to auscultation bilaterally Cardiac: Regular Rate and  Rhythm  Abdomen: Soft, non-tender, non-distended Skin: No rash, multiple punctate areas of almost blistering type spider varicosities on the ankle and pretibial region, area of fresh clot over a varicosity on the left lateral malleolus Extremity Pulses:  2+ radial, brachial, femoral, absent popliteal dorsalis pedis, posterior tibial pulses bilaterally Musculoskeletal: No deformity trace pretibial edema bilaterally  Neurologic: Upper and lower extremity motor 5/5 and symmetric  DATA:  Patient had a venous duplex ultrasound today which showed absent left greater saphenous vein.  She did have evidence of reflux in the deep system.  Also reviewed her ABIs from a few months ago which were 0.6 on the left 0.75 on the right  ASSESSMENT: Symptomatic varicose veins with multiple bleeding episodes from a varicosity over the left lateral malleolus.   PLAN: Believe the patient would benefit from sclerotherapy and injection of these areas of varicosity especially in the left leg to prevent further episodes of bleeding.  She has now had 4 bleeding episodes.  Patient was also given a prescription today for lower extremity compression stockings.  She should wear these during the day.  She was told to elevate her legs as much as possible.  I did express to her that she can be active and does not need to have her legs in a downward dependent position at all times.  I did discuss with her also that she does not have perfect arterial circulation in her left leg and if she develops a wound on the left side we might need to consider revascularization since her stenting to attempt failed.  However, since she does not have conduit with her previous vein stripping I would only consider using a prosthetic bypass in the same area where she was at risk of limb loss.  Hopefully she will not have any skin breakdown from her sclerotherapy.  Ruta Hinds, MD [facility] Office:  [phone] Pager: [phone]

## 2019-07-07 ENCOUNTER — Telehealth: Payer: Self-pay | Admitting: *Deleted

## 2019-07-07 NOTE — Telephone Encounter (Signed)
Returning [name]'s earlier voice mail regarding insurance coverage for sclerotherapy.  Ms. Fairburn saw Dr. Oneida Alar on 07-02-2019.  She has history of repeated bleeding episodes from varicosities on her left leg.  Informed Ms. Seth that I called [employer] today and was told that sclerotherapy (CPT [phone]) is a valid and billable code and that prior authorization is not required.  Call Reference # [id number].

## 2019-07-15 LAB — CBC
Hematocrit: 31.2 % — ABNORMAL LOW (ref 34.0–46.6)
Hemoglobin: 9.9 g/dL — ABNORMAL LOW (ref 11.1–15.9)
MCH: 26.3 pg — ABNORMAL LOW (ref 26.6–33.0)
MCHC: 31.7 g/dL (ref 31.5–35.7)
MCV: 83 fL (ref 79–97)
Platelets: 213 10*3/uL (ref 150–450)
RBC: 3.77 x10E6/uL (ref 3.77–5.28)
RDW: 14.6 % (ref 11.7–15.4)
WBC: 5.9 10*3/uL (ref 3.4–10.8)

## 2019-07-15 LAB — BASIC METABOLIC PANEL
BUN/Creatinine Ratio: 31 — ABNORMAL HIGH (ref 12–28)
BUN: 43 mg/dL — ABNORMAL HIGH (ref 8–27)
CO2: 26 mmol/L (ref 20–29)
Calcium: 10.6 mg/dL — ABNORMAL HIGH (ref 8.7–10.3)
Chloride: 100 mmol/L (ref 96–106)
Creatinine, Ser: 1.39 mg/dL — ABNORMAL HIGH (ref 0.57–1.00)
GFR calc Af Amer: 44 mL/min/{1.73_m2} — ABNORMAL LOW (ref >59)
GFR calc non Af Amer: 38 mL/min/{1.73_m2} — ABNORMAL LOW (ref >59)
Glucose: 88 mg/dL (ref 65–99)
Potassium: 4.9 mmol/L (ref 3.5–5.2)
Sodium: 140 mmol/L (ref 134–144)

## 2019-07-21 ENCOUNTER — Ambulatory Visit: Payer: Medicare Other | Admitting: Cardiology

## 2019-07-21 ENCOUNTER — Telehealth: Payer: Self-pay

## 2019-07-22 NOTE — Telephone Encounter (Signed)
CBC is improving, but still slightly low. Kidney function is overall stable. Be sure staying well hydrated. Would recommend that she have repeat labs either with [name] or PCP in the next 1 month

## 2019-07-24 ENCOUNTER — Other Ambulatory Visit: Payer: Self-pay | Admitting: Physician Assistant

## 2019-07-24 DIAGNOSIS — Z13820 Encounter for screening for osteoporosis: Secondary | ICD-10-CM

## 2019-08-04 ENCOUNTER — Ambulatory Visit (INDEPENDENT_AMBULATORY_CARE_PROVIDER_SITE_OTHER): Payer: Medicare Other

## 2019-08-04 ENCOUNTER — Other Ambulatory Visit: Payer: Self-pay

## 2019-08-04 DIAGNOSIS — Z8679 Personal history of other diseases of the circulatory system: Secondary | ICD-10-CM

## 2019-08-04 DIAGNOSIS — I781 Nevus, non-neoplastic: Secondary | ICD-10-CM

## 2019-08-04 DIAGNOSIS — I8393 Asymptomatic varicose veins of bilateral lower extremities: Secondary | ICD-10-CM

## 2019-08-04 NOTE — Progress Notes (Signed)
Treated pt's telangiectasia on both lower legs, ankles, foot area with Asclera 1%. Pt has a large amount of telangiectasia and will need further tx, which we discussed. Treated several of the areas that have bled in the past, though she has more that need further tx. This was administered with a 27g butterfly.  Patient received a total of 4 mL. Pt tolerated okay, has a lot of shin pain and soreness that she attributes to her leg veins. Pt was very unbalanced and had difficulty walking in to treatment room. She was unable to lay flat without being propped up high with several pillows. She said she has back and hip pain and has had several back surgeries. When she left the office we had her use a wheelchair, as she said her hip was hurting. Her friend pulled her Jeep around and pt was able to stand from wheelchair and get into [place] without assistance.    Photos: Yes.    Compression stockings applied: Yes.

## 2019-08-05 ENCOUNTER — Other Ambulatory Visit: Payer: Self-pay | Admitting: Cardiology

## 2019-08-08 ENCOUNTER — Telehealth: Payer: Self-pay

## 2019-08-08 NOTE — Telephone Encounter (Signed)
Pt called with c/o compression stockings leaving a mark around feet and feeling too tight. She is going to leave them off for a few days and then use her told pair of stockings that are more stretched out. She often elevates her legs during the day and will continue to do so. She was also wondering if it was okay to shower. Encouraged her to not take hot showers and treat areas that have bled in the past very cautiously and pat dry gently. Pt verbalized understanding and will call back with further questions/concerns.

## 2019-08-12 ENCOUNTER — Other Ambulatory Visit: Payer: Self-pay | Admitting: Physician Assistant

## 2019-08-12 ENCOUNTER — Ambulatory Visit: Payer: Medicare Other

## 2019-08-12 DIAGNOSIS — Z1231 Encounter for screening mammogram for malignant neoplasm of breast: Secondary | ICD-10-CM

## 2019-08-17 ENCOUNTER — Other Ambulatory Visit: Payer: Self-pay | Admitting: Cardiology

## 2019-08-17 DIAGNOSIS — I10 Essential (primary) hypertension: Secondary | ICD-10-CM

## 2019-09-08 ENCOUNTER — Other Ambulatory Visit: Payer: Self-pay | Admitting: Cardiology

## 2019-09-08 DIAGNOSIS — I739 Peripheral vascular disease, unspecified: Secondary | ICD-10-CM

## 2019-09-17 NOTE — Telephone Encounter (Signed)
complete

## 2019-10-23 ENCOUNTER — Other Ambulatory Visit: Payer: Self-pay | Admitting: Cardiology

## 2019-10-23 DIAGNOSIS — I10 Essential (primary) hypertension: Secondary | ICD-10-CM

## 2019-11-06 ENCOUNTER — Ambulatory Visit: Payer: Medicare Other | Admitting: Cardiology

## 2019-11-14 ENCOUNTER — Observation Stay (HOSPITAL_COMMUNITY)
Admission: EM | Admit: 2019-11-14 | Discharge: 2019-11-15 | Disposition: A | Discharge status: Home / Self Care | Payer: Medicare Other | Attending: Infectious Disease | Admitting: Infectious Disease

## 2019-11-14 ENCOUNTER — Other Ambulatory Visit: Payer: Self-pay

## 2019-11-14 DIAGNOSIS — Z87891 Personal history of nicotine dependence: Secondary | ICD-10-CM | POA: Insufficient documentation

## 2019-11-14 DIAGNOSIS — N179 Acute kidney failure, unspecified: Secondary | ICD-10-CM | POA: Diagnosis not present

## 2019-11-14 DIAGNOSIS — Z79899 Other long term (current) drug therapy: Secondary | ICD-10-CM | POA: Insufficient documentation

## 2019-11-14 DIAGNOSIS — Z853 Personal history of malignant neoplasm of breast: Secondary | ICD-10-CM | POA: Diagnosis not present

## 2019-11-14 DIAGNOSIS — G8929 Other chronic pain: Secondary | ICD-10-CM | POA: Diagnosis present

## 2019-11-14 DIAGNOSIS — Z7982 Long term (current) use of aspirin: Secondary | ICD-10-CM | POA: Insufficient documentation

## 2019-11-14 DIAGNOSIS — I83892 Varicose veins of left lower extremities with other complications: Secondary | ICD-10-CM | POA: Diagnosis not present

## 2019-11-14 DIAGNOSIS — G894 Chronic pain syndrome: Secondary | ICD-10-CM | POA: Diagnosis not present

## 2019-11-14 DIAGNOSIS — I130 Hypertensive heart and chronic kidney disease with heart failure and stage 1 through stage 4 chronic kidney disease, or unspecified chronic kidney disease: Secondary | ICD-10-CM | POA: Insufficient documentation

## 2019-11-14 DIAGNOSIS — I83899 Varicose veins of unspecified lower extremities with other complications: Secondary | ICD-10-CM | POA: Diagnosis present

## 2019-11-14 DIAGNOSIS — F329 Major depressive disorder, single episode, unspecified: Secondary | ICD-10-CM | POA: Diagnosis not present

## 2019-11-14 DIAGNOSIS — J432 Centrilobular emphysema: Secondary | ICD-10-CM | POA: Diagnosis not present

## 2019-11-14 DIAGNOSIS — F419 Anxiety disorder, unspecified: Secondary | ICD-10-CM | POA: Insufficient documentation

## 2019-11-14 DIAGNOSIS — Z20822 Contact with and (suspected) exposure to covid-19: Secondary | ICD-10-CM | POA: Diagnosis not present

## 2019-11-14 DIAGNOSIS — D62 Acute posthemorrhagic anemia: Principal | ICD-10-CM | POA: Insufficient documentation

## 2019-11-14 DIAGNOSIS — F1120 Opioid dependence, uncomplicated: Secondary | ICD-10-CM | POA: Insufficient documentation

## 2019-11-14 DIAGNOSIS — N1832 Chronic kidney disease, stage 3b: Secondary | ICD-10-CM | POA: Diagnosis not present

## 2019-11-14 DIAGNOSIS — E785 Hyperlipidemia, unspecified: Secondary | ICD-10-CM | POA: Insufficient documentation

## 2019-11-14 DIAGNOSIS — G2581 Restless legs syndrome: Secondary | ICD-10-CM | POA: Diagnosis not present

## 2019-11-14 DIAGNOSIS — Z85038 Personal history of other malignant neoplasm of large intestine: Secondary | ICD-10-CM | POA: Diagnosis not present

## 2019-11-14 DIAGNOSIS — Z66 Do not resuscitate: Secondary | ICD-10-CM | POA: Diagnosis not present

## 2019-11-14 DIAGNOSIS — I5032 Chronic diastolic (congestive) heart failure: Secondary | ICD-10-CM | POA: Diagnosis present

## 2019-11-14 DIAGNOSIS — R578 Other shock: Secondary | ICD-10-CM

## 2019-11-14 DIAGNOSIS — D509 Iron deficiency anemia, unspecified: Secondary | ICD-10-CM | POA: Diagnosis present

## 2019-11-14 DIAGNOSIS — R600 Localized edema: Secondary | ICD-10-CM | POA: Insufficient documentation

## 2019-11-14 DIAGNOSIS — J449 Chronic obstructive pulmonary disease, unspecified: Secondary | ICD-10-CM | POA: Diagnosis present

## 2019-11-14 DIAGNOSIS — R579 Shock, unspecified: Secondary | ICD-10-CM | POA: Diagnosis present

## 2019-11-14 DIAGNOSIS — I10 Essential (primary) hypertension: Secondary | ICD-10-CM | POA: Diagnosis present

## 2019-11-14 DIAGNOSIS — F32A Depression, unspecified: Secondary | ICD-10-CM | POA: Diagnosis present

## 2019-11-14 LAB — CBC WITH DIFFERENTIAL/PLATELET
Abs Immature Granulocytes: 0.03 10*3/uL (ref 0.00–0.07)
Basophils Absolute: 0 10*3/uL (ref 0.0–0.1)
Basophils Relative: 1 %
Eosinophils Absolute: 0.1 10*3/uL (ref 0.0–0.5)
Eosinophils Relative: 1 %
HCT: 25.8 % — ABNORMAL LOW (ref 36.0–46.0)
Hemoglobin: 7.1 g/dL — ABNORMAL LOW (ref 12.0–15.0)
Immature Granulocytes: 1 %
Lymphocytes Relative: 34 %
Lymphs Abs: 2.2 10*3/uL (ref 0.7–4.0)
MCH: 20.5 pg — ABNORMAL LOW (ref 26.0–34.0)
MCHC: 27.5 g/dL — ABNORMAL LOW (ref 30.0–36.0)
MCV: 74.6 fL — ABNORMAL LOW (ref 80.0–100.0)
Monocytes Absolute: 0.7 10*3/uL (ref 0.1–1.0)
Monocytes Relative: 10 %
Neutro Abs: 3.5 10*3/uL (ref 1.7–7.7)
Neutrophils Relative %: 53 %
Platelets: 97 10*3/uL — ABNORMAL LOW (ref 150–400)
RBC: 3.46 MIL/uL — ABNORMAL LOW (ref 3.87–5.11)
RDW: 20.8 % — ABNORMAL HIGH (ref 11.5–15.5)
WBC: 6.4 10*3/uL (ref 4.0–10.5)
nRBC: 0 % (ref 0.0–0.2)

## 2019-11-14 LAB — CBC
HCT: 30.2 % — ABNORMAL LOW (ref 36.0–46.0)
Hemoglobin: 9.2 g/dL — ABNORMAL LOW (ref 12.0–15.0)
MCH: 23.5 pg — ABNORMAL LOW (ref 26.0–34.0)
MCHC: 30.5 g/dL (ref 30.0–36.0)
MCV: 77 fL — ABNORMAL LOW (ref 80.0–100.0)
Platelets: 79 10*3/uL — ABNORMAL LOW (ref 150–400)
RBC: 3.92 MIL/uL (ref 3.87–5.11)
RDW: 20.8 % — ABNORMAL HIGH (ref 11.5–15.5)
WBC: 4.2 10*3/uL (ref 4.0–10.5)
nRBC: 0 % (ref 0.0–0.2)

## 2019-11-14 LAB — PHOSPHORUS: Phosphorus: 3.2 mg/dL (ref 2.5–4.6)

## 2019-11-14 LAB — COMPREHENSIVE METABOLIC PANEL
ALT: 14 U/L (ref 0–44)
AST: 20 U/L (ref 15–41)
Albumin: 3.1 g/dL — ABNORMAL LOW (ref 3.5–5.0)
Alkaline Phosphatase: 92 U/L (ref 38–126)
Anion gap: 11 (ref 5–15)
BUN: 77 mg/dL — ABNORMAL HIGH (ref 8–23)
CO2: 25 mmol/L (ref 22–32)
Calcium: 9.6 mg/dL (ref 8.9–10.3)
Chloride: 101 mmol/L (ref 98–111)
Creatinine, Ser: 2.74 mg/dL — ABNORMAL HIGH (ref 0.44–1.00)
GFR calc Af Amer: 19 mL/min — ABNORMAL LOW (ref >60)
GFR calc non Af Amer: 17 mL/min — ABNORMAL LOW (ref >60)
Glucose, Bld: 204 mg/dL — ABNORMAL HIGH (ref 70–99)
Potassium: 4.4 mmol/L (ref 3.5–5.1)
Sodium: 137 mmol/L (ref 135–145)
Total Bilirubin: 0.4 mg/dL (ref 0.3–1.2)
Total Protein: 5.6 g/dL — ABNORMAL LOW (ref 6.5–8.1)

## 2019-11-14 LAB — PREPARE RBC (CROSSMATCH)

## 2019-11-14 LAB — SARS CORONAVIRUS 2 BY RT PCR (HOSPITAL ORDER, PERFORMED IN ~~LOC~~ HOSPITAL LAB): SARS Coronavirus 2: NEGATIVE

## 2019-11-14 LAB — PROTIME-INR
INR: 1 (ref 0.8–1.2)
Prothrombin Time: 12.9 seconds (ref 11.4–15.2)

## 2019-11-14 LAB — MAGNESIUM: Magnesium: 2 mg/dL (ref 1.7–2.4)

## 2019-11-14 MED ORDER — ONDANSETRON HCL 4 MG PO TABS: 4.0000 mg | ORAL_TABLET | Freq: Four times a day (QID) | ORAL | Status: DC | PRN

## 2019-11-14 MED ORDER — ONDANSETRON HCL 4 MG/2ML IJ SOLN: 4.0000 mg | Freq: Four times a day (QID) | INTRAMUSCULAR | Status: DC | PRN

## 2019-11-14 MED ORDER — SODIUM CHLORIDE 0.9% IV SOLUTION: Freq: Once | INTRAVENOUS | Status: AC

## 2019-11-14 MED ORDER — LIDOCAINE-EPINEPHRINE (PF) 2 %-1:200000 IJ SOLN
INTRAMUSCULAR | Status: AC
  Filled 2019-11-14: qty 20

## 2019-11-14 MED ORDER — MIRTAZAPINE 15 MG PO TABS
30.0000 mg | ORAL_TABLET | Freq: Every day | ORAL | Status: DC
  Administered 2019-11-15: 30 mg via ORAL
  Filled 2019-11-14: qty 2

## 2019-11-14 MED ORDER — ATORVASTATIN CALCIUM 10 MG PO TABS
10.0000 mg | ORAL_TABLET | Freq: Every day | ORAL | Status: DC
  Administered 2019-11-15: 10 mg via ORAL
  Filled 2019-11-14: qty 1

## 2019-11-14 MED ORDER — OXYCODONE HCL 5 MG PO TABS
5.0000 mg | ORAL_TABLET | ORAL | Status: DC | PRN
  Administered 2019-11-15: 5 mg via ORAL
  Filled 2019-11-14: qty 1

## 2019-11-14 MED ORDER — SODIUM CHLORIDE 0.9 % IV BOLUS
500.0000 mL | Freq: Once | INTRAVENOUS | Status: AC
  Administered 2019-11-14: 500 mL via INTRAVENOUS

## 2019-11-14 MED ORDER — BUPROPION HCL ER (XL) 150 MG PO TB24
150.0000 mg | ORAL_TABLET | Freq: Every day | ORAL | Status: DC
  Administered 2019-11-15: 150 mg via ORAL
  Filled 2019-11-14: qty 1

## 2019-11-14 MED ORDER — ALPRAZOLAM 0.5 MG PO TABS
1.0000 mg | ORAL_TABLET | Freq: Three times a day (TID) | ORAL | Status: DC | PRN
  Administered 2019-11-14: 1 mg via ORAL
  Filled 2019-11-14: qty 2

## 2019-11-14 MED ORDER — ACETAMINOPHEN 325 MG PO TABS
650.0000 mg | ORAL_TABLET | Freq: Four times a day (QID) | ORAL | Status: DC | PRN
  Administered 2019-11-14: 650 mg via ORAL
  Filled 2019-11-14: qty 2

## 2019-11-14 MED ORDER — OXYCODONE HCL ER 15 MG PO T12A
60.0000 mg | EXTENDED_RELEASE_TABLET | Freq: Three times a day (TID) | ORAL | Status: DC
  Administered 2019-11-14 – 2019-11-15 (×3): 60 mg via ORAL
  Filled 2019-11-14: qty 3
  Filled 2019-11-14 (×2): qty 4

## 2019-11-14 MED ORDER — ROPINIROLE HCL 1 MG PO TABS
1.0000 mg | ORAL_TABLET | Freq: Every day | ORAL | Status: DC
  Administered 2019-11-14: 1 mg via ORAL
  Filled 2019-11-14 (×2): qty 1

## 2019-11-14 MED ORDER — ACETAMINOPHEN 650 MG RE SUPP: 650.0000 mg | Freq: Four times a day (QID) | RECTAL | Status: DC | PRN

## 2019-11-14 MED ORDER — HYDROMORPHONE HCL 1 MG/ML IJ SOLN
0.5000 mg | INTRAMUSCULAR | Status: DC | PRN
  Filled 2019-11-14: qty 1

## 2019-11-14 MED ORDER — GABAPENTIN 100 MG PO CAPS
100.0000 mg | ORAL_CAPSULE | Freq: Two times a day (BID) | ORAL | Status: DC
  Administered 2019-11-14 – 2019-11-15 (×2): 100 mg via ORAL
  Filled 2019-11-14 (×3): qty 1

## 2019-11-14 NOTE — ED Triage Notes (Signed)
Patient arrived via EMS; c/o varicose vein bleeding on left foot. EMS endorsed patient is refusing any sort of treatment / intervention. Patient in room, alert and oriented yelling at staff. Refusing to be changed in hospital gown. Left foot wrapped in ace wrap,bleeding controlled.

## 2019-11-14 NOTE — ED Notes (Signed)
Patient stated willing to received blood transfusion. Blood consent signed.

## 2019-11-14 NOTE — ED Notes (Signed)
Patient is side lying, w/ left arm elevated. Will reposition as soon as possible and will re-check blood pressure for better accuracy. Patient is awake and talking, HR WNL.

## 2019-11-14 NOTE — ED Notes (Signed)
Assisted patient to the bathroom, able to transfer from bed to chair. Patient stated normally able to walk better when chronic pain is controlled. Stated difficulty walking d/t pain and not weakness.

## 2019-11-14 NOTE — ED Notes (Signed)
3E unable to take report at this time. Call back number provided

## 2019-11-14 NOTE — ED Provider Notes (Signed)
[facility] EMERGENCY DEPARTMENT Provider Note   CSN: [id number] Arrival date & time: 11/14/19  4098     History Chief Complaint  Patient presents with  . Varicose Vein Bleeding    [name] is a 74 y.o. female.  The history is provided by the patient, the EMS personnel and medical records. No language interpreter was used.   [name] is a 74 y.o. female who presents to the Emergency Department complaining of bleeding varicose vein. She presents the emergency department by EMS for evaluation of bleeding from a varicose vein on her left foot. She states that she was putting on her compression stocking when she noticed the scab from a swan her foot had fallen off and there was blood all over the stocking. She went to another room and attempted to stop the bleeding and could not and EMS was called. EMS reports that there was significant amount of blood on towels and pulled on the floor in her home. She was hypotensive for EMS with blood pressures in the 60s. A pressure dressing was applied prior to ED arrival.    Past Medical History:  Diagnosis Date  . Acute renal failure superimposed on stage 3 chronic kidney disease ([place]) 10/27/2014  . Back pain, chronic   . Breast cancer ([place]) 09/09/10  . Cancer of colon ([place]) 05/24/2011  . Centrilobular emphysema ([place]) 07/15/2018  . CKD (chronic kidney disease), stage III   . Claudication, intermittent ([place]) 07/15/2018  . Depression   . GIB (gastrointestinal bleeding)   . H/O tobacco use, presenting hazards to health Quit Dec 2019 05/03/2011   50 years, up to 3PPD quit last year.   Marland Kitchen Hx of varicose veins of lower extremity 07/15/2018  . Hypertension   . SDH (subdural hematoma) ([place])   . SOB (shortness of breath) 07/15/2018    Patient Active Problem List   Diagnosis Date Noted  . Acute blood loss anemia 11/14/2019  . Shock ([place]) 06/22/2019  . Shock circulatory ([place]) 06/21/2019  . Ruptured varicose vein  06/21/2019  . COPD (chronic obstructive pulmonary disease) ([place]) 06/21/2019  . Chronic diastolic CHF (congestive heart failure) ([place]) 06/21/2019  . SOB (shortness of breath) 07/15/2018  . Laboratory examination 07/15/2018  . Claudication, intermittent ([place]) 07/15/2018  . Centrilobular emphysema ([place]) 07/15/2018  . Hx of varicose veins of lower extremity 07/15/2018  . Back pain 01/21/2016  . Sacroiliac joint dysfunction of left side 12/27/2015  . History of colon cancer 09/21/2015  . Iron deficiency anemia 06/04/2015  . Syncope 11/05/2014  . Narcotic addiction ([place])   . Chronic pain 10/27/2014  . Faintness   . Frequent falls 08/22/2014  . Facial contusion 08/22/2014  . Subdural hematoma ([place]) 08/21/2014  . Acute kidney injury superimposed on CKD ([place]) 01/20/2013  . Acute mastitis of right breast 04/05/2012  . Right shoulder pain 03/27/2012  . Breast erythema most likely secondary to mastitis / cellulitis 03/24/2012  . Hyponatremia 03/24/2012  . Cancer of colon ([place]) 05/24/2011  . Cancer of right breast, stage 1 ([place]) 05/24/2011  . Colonic mass 05/03/2011  . H/O tobacco use, presenting hazards to health 05/03/2011  . Weakness generalized 05/01/2011  . Anemia 05/01/2011  . Falls frequently 05/01/2011  . GI bleed 05/01/2011  . Hypokalemia 05/01/2011  . HTN (hypertension) 05/01/2011  . Anxiety and depression 05/01/2011  . Lower extremity edema 05/01/2011    Past Surgical History:  Procedure Laterality Date  . ABDOMINAL AORTOGRAM W/LOWER EXTREMITY Left 03/18/2019  Procedure: ABDOMINAL AORTOGRAM W/LOWER EXTREMITY;  Surgeon: Adrian Prows, MD;  Location: [facility];  Service: Cardiovascular;  Laterality: Left;  . BACK SURGERY    . BREAST LUMPECTOMY  10/10/2010   Rt Breast  . COLON SURGERY  04/2011  . COLONOSCOPY  05/03/2011   Procedure: COLONOSCOPY;  Surgeon: Jeryl Columbia, MD;  Location: [facility] ENDOSCOPY;  Service: Endoscopy;  Laterality: N/A;  . JOINT REPLACEMENT     R&L  total shoulder replacements  . LUMBAR DISC SURGERY    . PARTIAL HYSTERECTOMY    . PERIPHERAL VASCULAR INTERVENTION  03/18/2019   Procedure: PERIPHERAL VASCULAR INTERVENTION;  Surgeon: Adrian Prows, MD;  Location: [facility];  Service: Cardiovascular;;  attempted left SFA  . SHOULDER SURGERY    . TUMOR REMOVAL  Tumor removed R breast     OB History   No obstetric history on file.     Family History  Problem Relation Age of Onset  . Diabetes Mother   . Hypertension Mother   . Cancer Mother        leukemia  . Sudden death Mother   . Heart attack Mother   . Anesthesia problems Neg Hx   . Hypotension Neg Hx   . Malignant hyperthermia Neg Hx   . Pseudochol deficiency Neg Hx   . Breast cancer Neg Hx     Social History   Tobacco Use  . Smoking status: Current Every Day Smoker    Packs/day: 0.25    Years: 30.00    Pack years: 7.50    Types: Cigarettes  . Smokeless tobacco: Former Systems developer    Quit date: 04/22/2010  Vaping Use  . Vaping Use: Never used  Substance Use Topics  . Alcohol use: Yes    Comment: rarely   . Drug use: No    Home Medications Prior to Admission medications   Medication Sig Start Date End Date Taking? Authorizing Provider  ALPRAZolam Duanne Moron) 1 MG tablet as needed.  04/22/16  Yes [provider]  amLODipine (NORVASC) 5 MG tablet TAKE 1 TABLET BY MOUTH EVERY DAY 10/23/19  Yes Adrian Prows, MD  aspirin EC 81 MG tablet Take 81 mg by mouth daily. Swallow whole.   Yes [provider]  atorvastatin (LIPITOR) 10 MG tablet TAKE 1 TABLET BY MOUTH EVERY DAY Patient taking differently: Take 10 mg by mouth daily.  06/03/19  Yes Miquel Dunn, NP  buPROPion (WELLBUTRIN XL) 150 MG 24 hr tablet Take 150 mg by mouth daily.   Yes [provider]  furosemide (LASIX) 20 MG tablet Take 20 mg by mouth daily as needed for edema.    Yes [provider]  gabapentin (NEURONTIN) 100 MG capsule Take 100 mg by mouth 3 (three) times daily.  04/01/19  Yes [provider]  hydrALAZINE (APRESOLINE) 25 MG tablet TAKE 1 TABLET BY MOUTH THREE TIMES A DAY 08/18/19  Yes Miquel Dunn, NP  losartan-hydrochlorothiazide (HYZAAR) 100-12.5 MG tablet Take 1 tablet by mouth daily. 03/21/19  Yes Adrian Prows, MD  mirtazapine (REMERON) 30 MG tablet 30 mg daily.  04/22/16  Yes [provider]  oxyCODONE (OXYCONTIN) 60 MG 12 hr tablet Take 1 tablet by mouth every 8 (eight) hours.   Yes [provider]  potassium chloride (KLOR-CON) 10 MEQ tablet TAKE 1 TABLET (10 MEQ TOTAL) BY MOUTH DAILY. TAKE WITH LASIX 08/05/19  Yes Adrian Prows, MD  rOPINIRole (REQUIP) 1 MG tablet Take 1 mg by mouth at bedtime.  Yes [provider]  clopidogrel (PLAVIX) 75 MG tablet TAKE 1 TABLET BY MOUTH EVERY DAY Patient not taking: Reported on 11/14/2019 09/08/19   Adrian Prows, MD    Allergies    Patient has no known allergies.  Review of Systems   Review of Systems  All other systems reviewed and are negative.   Physical Exam Updated Vital Signs BP 115/61   Pulse (!) 53   Temp 97.7 F (36.5 C) (Oral)   Resp 14   Ht 5' (1.524 m)   Wt 54.4 kg   SpO2 93%   BMI 23.44 kg/m   Physical Exam Vitals and nursing note reviewed.  Constitutional:      Appearance: She is well-developed.  HENT:     Head: Normocephalic and atraumatic.  Cardiovascular:     Rate and Rhythm: Normal rate and regular rhythm.     Heart sounds: No murmur heard.   Pulmonary:     Effort: Pulmonary effort is normal. No respiratory distress.     Breath sounds: Normal breath sounds.  Abdominal:     Palpations: Abdomen is soft.     Tenderness: There is no abdominal tenderness. There is no guarding or rebound.  Musculoskeletal:     Comments: There is trace none pitting edema to bilateral lower extremities. 2+ DP pulses bilaterally. There is a 1 cm wound to the left lateral ankle with active venous oozing. No surrounding erythema  Skin:    General: Skin is  warm and dry.     Capillary Refill: Capillary refill takes less than 2 seconds.  Neurological:     Mental Status: She is alert and oriented to person, place, and time.  Psychiatric:        Behavior: Behavior normal.     ED Results / Procedures / Treatments   Labs (all labs ordered are listed, but only abnormal results are displayed) Labs Reviewed  CBC WITH DIFFERENTIAL/PLATELET - Abnormal; Notable for the following components:      Result Value   RBC 3.46 (*)    Hemoglobin 7.1 (*)    HCT 25.8 (*)    MCV 74.6 (*)    MCH 20.5 (*)    MCHC 27.5 (*)    RDW 20.8 (*)    Platelets 97 (*)    All other components within normal limits  COMPREHENSIVE METABOLIC PANEL - Abnormal; Notable for the following components:   Glucose, Bld 204 (*)    BUN 77 (*)    Creatinine, Ser 2.74 (*)    Total Protein 5.6 (*)    Albumin 3.1 (*)    GFR calc non Af Amer 17 (*)    GFR calc Af Amer 19 (*)    All other components within normal limits  SARS CORONAVIRUS 2 BY RT PCR (HOSPITAL ORDER, [facility])  PROTIME-INR  MAGNESIUM  PHOSPHORUS  CBC  TYPE AND SCREEN  PREPARE RBC (CROSSMATCH)    EKG EKG Interpretation  Date/Time:  Friday November 14 2019 11:41:38 EDT Ventricular Rate:  86 PR Interval:    QRS Duration: 87 QT Interval:  345 QTC Calculation: 413 R Axis:   87 Text Interpretation: Sinus rhythm Ventricular trigeminy Borderline right axis deviation Borderline T abnormalities, inferior leads Confirmed by Davonna Belling [phone]) on 11/14/2019 3:27:38 PM   Radiology No results found.  Procedures .Marland KitchenLaceration Repair  Date/Time: 11/14/2019 10:00 AM Performed by: Quintella Reichert, MD Authorized by: Quintella Reichert, MD   Consent:    Consent obtained:  Verbal   Consent given by:  Patient   Risks discussed:  Infection, pain and need for additional repair Anesthesia (see MAR for exact dosages):    Anesthesia method:  Local infiltration   Local anesthetic:  Lidocaine  2% WITH epi Laceration details:    Location:  Foot   Foot location:  L ankle   Length (cm):  1 Repair type:    Repair type:  Intermediate Pre-procedure details:    Preparation:  Patient was prepped and draped in usual sterile fashion Exploration:    Contaminated: no   Treatment:    Area cleansed with:  Shur-Clens   Amount of cleaning:  Standard Skin repair:    Repair method:  Sutures and tissue adhesive   Suture size:  4-0   Wound skin closure material used: vicryl.   Suture technique:  Simple interrupted   Number of sutures:  1 Approximation:    Approximation:  Close Comments:     Wound closed with a single interrupted suture. Hemostasis was achieved with this and overlying dermabond was placed as well.   (including critical care time) CRITICAL CARE Performed by: Quintella Reichert   Total critical care time: 60 minutes  Critical care time was exclusive of separately billable procedures and treating other patients.  Critical care was necessary to treat or prevent imminent or life-threatening deterioration.  Critical care was time spent personally by me on the following activities: development of treatment plan with patient and/or surrogate as well as nursing, discussions with consultants, evaluation of patient's response to treatment, examination of patient, obtaining history from patient or surrogate, ordering and performing treatments and interventions, ordering and review of laboratory studies, ordering and review of radiographic studies, pulse oximetry and re-evaluation of patient's condition.  Medications Ordered in ED Medications  lidocaine-EPINEPHrine (XYLOCAINE W/EPI) 2 %-1:200000 (PF) injection (has no administration in time range)  0.9 %  sodium chloride infusion (Manually program via Guardrails IV Fluids) (has no administration in time range)  acetaminophen (TYLENOL) tablet 650 mg (has no administration in time range)    Or  acetaminophen (TYLENOL) suppository 650  mg (has no administration in time range)  oxyCODONE (Oxy IR/ROXICODONE) immediate release tablet 5 mg (has no administration in time range)  HYDROmorphone (DILAUDID) injection 0.5-1 mg (has no administration in time range)  ondansetron (ZOFRAN) tablet 4 mg (has no administration in time range)    Or  ondansetron (ZOFRAN) injection 4 mg (has no administration in time range)  atorvastatin (LIPITOR) tablet 10 mg (has no administration in time range)  ALPRAZolam (XANAX) tablet 1 mg (has no administration in time range)  buPROPion (WELLBUTRIN XL) 24 hr tablet 150 mg (has no administration in time range)  mirtazapine (REMERON) tablet 30 mg (has no administration in time range)  rOPINIRole (REQUIP) tablet 1 mg (has no administration in time range)  oxyCODONE (OXYCONTIN) 12 hr tablet 60 mg (60 mg Oral Given 11/14/19 1505)  gabapentin (NEURONTIN) capsule 100 mg (has no administration in time range)  sodium chloride 0.9 % bolus 500 mL (0 mLs Intravenous Stopped 11/14/19 1010)  sodium chloride 0.9 % bolus 500 mL (0 mLs Intravenous Stopped 11/14/19 1040)    ED Course  I have reviewed the triage vital signs and the nursing notes.  Pertinent labs & imaging results that were available during my care of the patient were reviewed by me and considered in my medical decision making (see chart for details).    MDM Rules/Calculators/A&P  Patient with history of chronic kidney disease here for evaluation of bleeding from her left foot. Per EMS reports she had massive hemorrhage at her home with hypotension. On ED arrival patient refuses all blood products and states that she wants to go home and does not want any life-prolonging measures. Hemorrhage was controlled per procedure note. She was treated with IV fluids for her hypotension. Despite IV fluid resuscitation she had ongoing and persistent hypotension. Labs demonstrate significant anemia, acute kidney injury. On repeat discussion  patient is agreeable to blood products. Discussed with patient concern for her safely and comfortably going home in her current state as she currently lives alone. She is agreeable for admission for blood products at this time. Palliative care consulted for goals of care discussions. Medicine consulted for admission.  Final Clinical Impression(s) / ED Diagnoses Final diagnoses:  Hemorrhagic shock ([place])  AKI (acute kidney injury) ([place])  Bleeding from varicose vein    Rx / DC Orders ED Discharge Orders    None       Quintella Reichert, MD 11/14/19 [phone]

## 2019-11-14 NOTE — H&P (Signed)
History and Physical    Holly S Parkhurst NGE:[id number] DOB: [birth year] DOA: 11/14/2019  PCP: Maude Leriche, PA-C  Patient coming from: Home  I have personally briefly reviewed patient's old medical records in [place]  Chief Complaint: Bleeding from varicose vain started this am   HPI: [name] is a 74 y.o. female with medical history significant of COPD, tobacco use, IDA, depression with anxiety, hypertension, CKD stage III, history of GI bleed, history of varicose vein presents to emergency department due to bleeding from varicose vein on her left ankle- started this morning.  Patient tells me that while she was putting on her compression stocking this morning she noticed a scab had fallen off and there was blood all over the stocking.  She tells me that bleeding was so profuse that 3 of her towels were soaked with blood.  She tried to stop bleeding and could not therefore she called EMS and came to the emergency department for further evaluation and management.  EMS applied a pressure dressing.  Her blood pressure was in 60s upon arrival to EMS.  No history of headache, blurry vision, lightheadedness, dizziness, chest pain, shortness of breath, palpitation, fever, chills, cough, congestion, nausea, vomiting, abdominal pain, melena or hematemesis or urinary symptoms.  She lives alone at home.  Independent on daily life activities.  Does not use cane or walker at home.  She tells me that she has quit smoking 3 weeks ago cold [place].  No history of illicit drug use however drinks alcohol occasionally.  She does not use home oxygen.  Of note: Patient admitted in January 2021 with a circulatory shock secondary to ruptured varicose vein.  Initially patient refused for admission-however with brief counseling she decided to stay overnight.  She does not want heroic measures including chest compressions or intubation.  ED Course: Upon arrival to ED: Her blood pressure was 80s  over 40s.  Not tachycardic, not tachypneic, afebrile with no leukocytosis.  H&H: 7.1/25.8 was 9.9/31.24 months ago.  MCV: 74.6, CMP shows AKI on CKD stage III, INR: WNL, COVID-19 pending.  Bleeding varicose vein was repaired in the ED.  2 unit PRBC ordered & Palliative care consulted by EDP.  [facility] hospitalist consulted for admission for acute blood loss anemia.  Review of Systems: As per HPI otherwise negative.    Past Medical History:  Diagnosis Date  . Acute renal failure superimposed on stage 3 chronic kidney disease ([place]) 10/27/2014  . Back pain, chronic   . Breast cancer ([place]) 09/09/10  . Cancer of colon ([place]) 05/24/2011  . Centrilobular emphysema ([place]) 07/15/2018  . CKD (chronic kidney disease), stage III   . Claudication, intermittent ([place]) 07/15/2018  . Depression   . GIB (gastrointestinal bleeding)   . H/O tobacco use, presenting hazards to health Quit Dec 2019 05/03/2011   50 years, up to 3PPD quit last year.   [place] Hx of varicose veins of lower extremity 07/15/2018  . Hypertension   . SDH (subdural hematoma) ([place])   . SOB (shortness of breath) 07/15/2018    Past Surgical History:  Procedure Laterality Date  . ABDOMINAL AORTOGRAM W/LOWER EXTREMITY Left 03/18/2019   Procedure: ABDOMINAL AORTOGRAM W/LOWER EXTREMITY;  Surgeon: Adrian Prows, MD;  Location: [facility];  Service: Cardiovascular;  Laterality: Left;  . BACK SURGERY    . BREAST LUMPECTOMY  10/10/2010   Rt Breast  . COLON SURGERY  04/2011  . COLONOSCOPY  05/03/2011   Procedure: COLONOSCOPY;  Surgeon: Altamese Dilling  Drema Balzarine, MD;  Location: [facility];  Service: Endoscopy;  Laterality: N/A;  . JOINT REPLACEMENT     R&L total shoulder replacements  . LUMBAR DISC SURGERY    . PARTIAL HYSTERECTOMY    . PERIPHERAL VASCULAR INTERVENTION  03/18/2019   Procedure: PERIPHERAL VASCULAR INTERVENTION;  Surgeon: Adrian Prows, MD;  Location: [facility];  Service: Cardiovascular;;  attempted left SFA  . SHOULDER SURGERY    . TUMOR  REMOVAL  Tumor removed R breast     reports that she has been smoking cigarettes. She has a 7.50 pack-year smoking history. She quit smokeless tobacco use about 9 years ago. She reports current alcohol use. She reports that she does not use drugs.  No Known Allergies  Family History  Problem Relation Age of Onset  . Diabetes Mother   . Hypertension Mother   . Cancer Mother        leukemia  . Sudden death Mother   . Heart attack Mother   . Anesthesia problems Neg Hx   . Hypotension Neg Hx   . Malignant hyperthermia Neg Hx   . Pseudochol deficiency Neg Hx   . Breast cancer Neg Hx     Prior to Admission medications   Medication Sig Start Date End Date Taking? Authorizing Provider  ALPRAZolam Duanne Moron) 1 MG tablet as needed.  04/22/16  Yes [provider]  amLODipine (NORVASC) 5 MG tablet TAKE 1 TABLET BY MOUTH EVERY DAY 10/23/19  Yes Adrian Prows, MD  aspirin EC 81 MG tablet Take 81 mg by mouth daily. Swallow whole.   Yes [provider]  atorvastatin (LIPITOR) 10 MG tablet TAKE 1 TABLET BY MOUTH EVERY DAY Patient taking differently: Take 10 mg by mouth daily.  06/03/19  Yes Miquel Dunn, NP  buPROPion (WELLBUTRIN XL) 150 MG 24 hr tablet Take 150 mg by mouth daily.   Yes [provider]  furosemide (LASIX) 20 MG tablet Take 20 mg by mouth daily as needed for edema.    Yes [provider]  gabapentin (NEURONTIN) 100 MG capsule Take 100 mg by mouth 3 (three) times daily. 04/01/19  Yes [provider]  hydrALAZINE (APRESOLINE) 25 MG tablet TAKE 1 TABLET BY MOUTH THREE TIMES A DAY 08/18/19  Yes Miquel Dunn, NP  losartan-hydrochlorothiazide (HYZAAR) 100-12.5 MG tablet Take 1 tablet by mouth daily. 03/21/19  Yes Adrian Prows, MD  mirtazapine (REMERON) 30 MG tablet 30 mg daily.  04/22/16  Yes [provider]  oxyCODONE (OXYCONTIN) 60 MG 12 hr tablet Take 1 tablet by mouth every 8 (eight) hours.   Yes [provider]    potassium chloride (KLOR-CON) 10 MEQ tablet TAKE 1 TABLET (10 MEQ TOTAL) BY MOUTH DAILY. TAKE WITH LASIX 08/05/19  Yes Adrian Prows, MD  rOPINIRole (REQUIP) 1 MG tablet Take 1 mg by mouth at bedtime.   Yes [provider]  clopidogrel (PLAVIX) 75 MG tablet TAKE 1 TABLET BY MOUTH EVERY DAY Patient not taking: Reported on 11/14/2019 09/08/19   Adrian Prows, MD    Physical Exam: Vitals:   11/14/19 1032 11/14/19 1045 11/14/19 1155 11/14/19 1219  BP: (!) 99/45 (!) 110/57 (!) 103/49 (!) 91/49  Pulse:  (!) 37 73 71  Resp:   17 16  Temp:   98.2 F (36.8 C) 97.6 F (36.4 C)  TempSrc:    Oral  SpO2:  97% 93% 100%  Weight:      Height:  Constitutional: NAD, calm, comfortable, communicating well, alert and oriented x4. Eyes: PERRL, lids and conjunctivae: Pale ENMT: Mucous membranes are dry. Posterior pharynx clear of any exudate or lesions.Normal dentition.  Neck: normal, supple, no masses, no thyromegaly Respiratory: clear to auscultation bilaterally, no wheezing, no crackles. Normal respiratory effort. No accessory muscle use.  Cardiovascular: Regular rate and rhythm, no murmurs / rubs / gallops. No extremity edema. 2+ pedal pulses. No carotid bruits.  Abdomen: no tenderness, no masses palpated. No hepatosplenomegaly. Bowel sounds positive.  Musculoskeletal: no clubbing / cyanosis. No joint deformity upper and lower extremities. Good ROM, no contractures. Normal muscle tone.  Skin: Left ankle: Sutures intact on lateral aspect of left ankle.  No active bleeding.  Dried blood noted on left foot. Neurologic: CN 2-12 grossly intact. Sensation intact, DTR normal. Strength 5/5 in all 4.  Psychiatric: Normal judgment and insight. Alert and oriented x 3. Normal mood.    Labs on Admission: I have personally reviewed following labs and imaging studies  CBC: Recent Labs  Lab 11/14/19 0906  WBC 6.4  NEUTROABS 3.5  HGB 7.1*  HCT 25.8*  MCV 74.6*  PLT 97*   Basic Metabolic  Panel: Recent Labs  Lab 11/14/19 0906  NA 137  K 4.4  CL 101  CO2 25  GLUCOSE 204*  BUN 77*  CREATININE 2.74*  CALCIUM 9.6   GFR: Estimated Creatinine Clearance: 13.1 mL/min (A) (by C-G formula based on SCr of 2.74 mg/dL (H)). Liver Function Tests: Recent Labs  Lab 11/14/19 0906  AST 20  ALT 14  ALKPHOS 92  BILITOT 0.4  PROT 5.6*  ALBUMIN 3.1*   No results for input(s): LIPASE, AMYLASE in the last 168 hours. No results for input(s): AMMONIA in the last 168 hours. Coagulation Profile: Recent Labs  Lab 11/14/19 0906  INR 1.0   Cardiac Enzymes: No results for input(s): CKTOTAL, CKMB, CKMBINDEX, TROPONINI in the last 168 hours. BNP (last 3 results) No results for input(s): PROBNP in the last 8760 hours. HbA1C: No results for input(s): HGBA1C in the last 72 hours. CBG: No results for input(s): GLUCAP in the last 168 hours. Lipid Profile: No results for input(s): CHOL, HDL, LDLCALC, TRIG, CHOLHDL, LDLDIRECT in the last 72 hours. Thyroid Function Tests: No results for input(s): TSH, T4TOTAL, FREET4, T3FREE, THYROIDAB in the last 72 hours. Anemia Panel: No results for input(s): VITAMINB12, FOLATE, FERRITIN, TIBC, IRON, RETICCTPCT in the last 72 hours. Urine analysis:    Component Value Date/Time   COLORURINE STRAW (A) 06/21/2019 0310   APPEARANCEUR CLEAR 06/21/2019 0310   LABSPEC 1.008 06/21/2019 0310   PHURINE 5.0 06/21/2019 0310   GLUCOSEU NEGATIVE 06/21/2019 0310   HGBUR NEGATIVE 06/21/2019 0310   BILIRUBINUR NEGATIVE 06/21/2019 0310   KETONESUR NEGATIVE 06/21/2019 0310   PROTEINUR NEGATIVE 06/21/2019 0310   UROBILINOGEN 0.2 11/05/2014 0437   NITRITE NEGATIVE 06/21/2019 0310   LEUKOCYTESUR NEGATIVE 06/21/2019 0310    Radiological Exams on Admission: No results found.  EKG: Independently reviewed.  Sinus rhythm, ventricular trigeminy, borderline right axis deviation.  No ST elevation or depression noted.  Assessment/Plan Principal Problem:   Acute  blood loss anemia Active Problems:   HTN (hypertension)   Anxiety and depression   Acute kidney injury superimposed on CKD (H[facility])   Chronic pain   Iron deficiency anemia   Ruptured varicose vein   COPD (chronic obstructive pulmonary disease) (H[facility])   Chronic diastolic CHF (congestive heart failure) (H[facility])    Acute blood loss anemia: Secondary to  ruptured varicose vein on left ankle. -Status post suture placement in ER -H&H: 7.1/25.8 was 9.9/31.2--4 months ago.  MCV: 74.6 -Patient-hypotensive upon arrival to ED.  2 unit PRBC ordered.   -PT/INR: WNL.  Platelet: 97, COVID-19 pending -Admit patient to stepdown unit for close monitoring.  On continuous pulse ox.  On telemetry. -Monitor H&H closely q4h. -Monitor vitals closely. -Needs to f/u with vascular surgery outpatient. -EDP consulted palliative care.  AKI on CKD stage IIIb: -Creatinine: 2.74, GFR: 17 (baseline creatinine: 1.39, GFR: 38) -Likely secondary to decreased blood supply to kidney due to acute blood loss. -Transfuse PRBC.  Monitor kidney function closely.  Hold nephrotoxic medication-Hyzaar, Lasix. -Repeat CMP tomorrow a.m.  Hypertension: Patient is currently hypotensive -Hold amlodipine, Lasix, hydralazine, Hyzaar for now.  Monitor blood pressure closely.  Anxiety and depression: -Continue Wellbutrin, Remeron, Xanax as needed  Chronic pain syndrome: Continue oxycodone  Restless leg: Continue Requip, hold gabapentin due to worsening kidney function.  COPD: Stable -No wheezing noted on exam  -On continuous pulse ox.  Chronic diastolic CHF: Not in acute exacerbation. -Patient has chronic bilateral leg swelling.  She appears euvolemic. -Echo from 11/26/2018 reviewed which shows ejection fraction of 57 with grade 1 diastolic CHF. -Strict INO's and daily weight.  Monitor signs for fluid overload.  Hold Lasix due to AKI.  Monitor electrolytes.  Tobacco abuse: Patient tells me that she has quit smoking 3 weeks  ago.  Hyperlipidemia/claudication: Continue statin  DVT prophylaxis: SCD Code Status: DNR-confirmed with the patient Family Communication: None present at bedside.  Plan of care discussed with patient in length and she verbalized understanding and agreed with it. Disposition Plan: Likely home in 2 days Consults called: Palliative Admission status: Inpatient   Mckinley Jewel MD [facility]  If 7PM-7AM, please contact night-coverage [email or web address] Password [facility]  11/14/2019, 12:34 PM

## 2019-11-15 DIAGNOSIS — I83899 Varicose veins of unspecified lower extremities with other complications: Secondary | ICD-10-CM

## 2019-11-15 DIAGNOSIS — F419 Anxiety disorder, unspecified: Secondary | ICD-10-CM

## 2019-11-15 DIAGNOSIS — I5032 Chronic diastolic (congestive) heart failure: Secondary | ICD-10-CM | POA: Diagnosis not present

## 2019-11-15 DIAGNOSIS — D62 Acute posthemorrhagic anemia: Secondary | ICD-10-CM | POA: Diagnosis not present

## 2019-11-15 DIAGNOSIS — N179 Acute kidney failure, unspecified: Secondary | ICD-10-CM | POA: Diagnosis not present

## 2019-11-15 DIAGNOSIS — I10 Essential (primary) hypertension: Secondary | ICD-10-CM

## 2019-11-15 DIAGNOSIS — F329 Major depressive disorder, single episode, unspecified: Secondary | ICD-10-CM

## 2019-11-15 DIAGNOSIS — N189 Chronic kidney disease, unspecified: Secondary | ICD-10-CM

## 2019-11-15 LAB — TYPE AND SCREEN
ABO/RH(D): O NEG
Antibody Screen: NEGATIVE
Unit division: 0
Unit division: 0
Weak D: POSITIVE

## 2019-11-15 LAB — COMPREHENSIVE METABOLIC PANEL
ALT: 11 U/L (ref 0–44)
AST: 17 U/L (ref 15–41)
Albumin: 2.7 g/dL — ABNORMAL LOW (ref 3.5–5.0)
Alkaline Phosphatase: 61 U/L (ref 38–126)
Anion gap: 8 (ref 5–15)
BUN: 53 mg/dL — ABNORMAL HIGH (ref 8–23)
CO2: 28 mmol/L (ref 22–32)
Calcium: 9.3 mg/dL (ref 8.9–10.3)
Chloride: 105 mmol/L (ref 98–111)
Creatinine, Ser: 1.49 mg/dL — ABNORMAL HIGH (ref 0.44–1.00)
GFR calc Af Amer: 40 mL/min — ABNORMAL LOW (ref >60)
GFR calc non Af Amer: 34 mL/min — ABNORMAL LOW (ref >60)
Glucose, Bld: 89 mg/dL (ref 70–99)
Potassium: 4.2 mmol/L (ref 3.5–5.1)
Sodium: 141 mmol/L (ref 135–145)
Total Bilirubin: 0.4 mg/dL (ref 0.3–1.2)
Total Protein: 4.7 g/dL — ABNORMAL LOW (ref 6.5–8.1)

## 2019-11-15 LAB — CBC
HCT: 27.4 % — ABNORMAL LOW (ref 36.0–46.0)
Hemoglobin: 8.1 g/dL — ABNORMAL LOW (ref 12.0–15.0)
MCH: 23 pg — ABNORMAL LOW (ref 26.0–34.0)
MCHC: 29.6 g/dL — ABNORMAL LOW (ref 30.0–36.0)
MCV: 77.8 fL — ABNORMAL LOW (ref 80.0–100.0)
Platelets: 80 10*3/uL — ABNORMAL LOW (ref 150–400)
RBC: 3.52 MIL/uL — ABNORMAL LOW (ref 3.87–5.11)
RDW: 21.1 % — ABNORMAL HIGH (ref 11.5–15.5)
WBC: 3.6 10*3/uL — ABNORMAL LOW (ref 4.0–10.5)
nRBC: 0 % (ref 0.0–0.2)

## 2019-11-15 LAB — BPAM RBC
Blood Product Expiration Date: 202107272359
Blood Product Expiration Date: 202107272359
ISSUE DATE / TIME: 202106251142
ISSUE DATE / TIME: 202106251451
Unit Type and Rh: 9500
Unit Type and Rh: 9500

## 2019-11-15 NOTE — Care Management CC44 (Signed)
Condition Code 44 Documentation Completed  Patient Details  Name: [name] MRN: [id number] Date of Birth: [birth year]   Condition Code 44 given:   Yes  Patient signature on Condition Code 44 notice:   Yes Documentation of 2 MD's agreement:   Yes Code 44 added to claim:   Yes    Norina Buzzard, RN 11/15/2019, 12:39 PM

## 2019-11-15 NOTE — Discharge Summary (Signed)
Physician Discharge Summary  Holly S Rauth WNU:[id number] DOB: May 30, 1945 DOA: 11/14/2019  PCP: Maude Leriche, PA-C  Admit date: 11/14/2019 Discharge date: 11/15/2019  Admitted From: Home Disposition:  Home  Recommendations for Outpatient Follow-up:  1. Follow up with PCP in 1-2 weeks 2. Please obtain BMP/CBC in one week   Home Health: No Discharge Condition: stable CODE STATUS: DNR Diet recommendation: Heart Healthy  Brief/Interim Summary: [name] is a 74 y.o. female with medical history significant of COPD, tobacco use, IDA, depression with anxiety, hypertension, CKD stage III, history of GI bleed, history of varicose vein presented to emergency department due to bleeding from varicose vein on her left ankle- started yesterday morning.  Per patient while she was putting on her compression stocking she noticed a scab had fallen off and there was blood all over the stocking, so profuse that 3 of her towels were soaked with blood.  She tried to stop bleeding and could not therefore she called EMS and came to the emergency department for further evaluation and management.  EMS applied a pressure dressing.  Her blood pressure was in 60s upon arrival to EMS.  No history of headache, blurry vision, lightheadedness, dizziness, chest pain, shortness of breath, palpitation, fever, chills, cough, congestion, nausea, vomiting, abdominal pain, melena or hematemesis or urinary symptoms.  She lives alone at home.  Independent on daily life activities.  She she has quit smoking 3 weeks ago cold [place].  No history of illicit drug use however drinks alcohol occasionally.  She does not use home oxygen.  Of note: Patient admitted in January 2021 with a circulatory shock secondary to ruptured varicose vein.  Initially patient refused for admission-however with brief counseling she decided to stay overnight.   ED Course: Upon arrival to ED: Her blood pressure was 80s over 40s.  Not  tachycardic, not tachypneic, afebrile with no leukocytosis.  H&H: 7.1/25.8 was 9.9/31.24 months ago.  MCV: 74.6, CMP shows AKI on CKD stage III, INR: WNL, COVID-19 negative.  Bleeding varicose vein was repaired in the ED.  2 unit PRBC given.  She was admitted for acute blood loss anemia.  Discharge Diagnoses:  Principal Problem:   Acute blood loss anemia Active Problems:   HTN (hypertension)   Anxiety and depression   Acute kidney injury superimposed on CKD (HCC)   Chronic pain   Iron deficiency anemia   Ruptured varicose vein   COPD (chronic obstructive pulmonary disease) (HCC)   Chronic diastolic CHF (congestive heart failure) (HCC)  Acute blood loss anemia: Secondary to ruptured varicose vein on left ankle. -Status post suture placement in ER -H&H: 7.1/25.8 was 9.9/31.2--4 months ago.  MCV: 74.6 -Patient-hypotensive upon arrival to ED.  2 unit PRBC given.   -PT/INR: WNL.  Platelet count yesterday was 79 this morning 80. -Hemoglobin this morning 8.1. -At this time bleeding has completely stopped. -Blood pressure also stable.  She is wanting to be discharged home. -Needs to f/u with vascular surgery outpatient. -EDP consulted palliative care.  -Noticed Plavix listed on the home medication but patient says she is not taking the Plavix. -Patient says she wishes to be DNR and wants to be discharged home.  AKI on CKD stage IIIb: -Creatinine: 2.74, GFR: 17 (baseline creatinine: 1.39, GFR: 38) -Likely secondary to decreased blood supply to kidney due to acute blood loss. -After blood transfusion creatinine improved to 1.49. -Blood pressure also improved. -She is advised to follow-up with her outpatient physician.  Hypertension: Patient is currently hypotensive -Held  amlodipine, Lasix, hydralazine, Hyzaar on admission.  Blood pressure improving. -Patient insisting on being discharged home. -Will hold the Lasix, Norvasc since the diastolic blood pressure is still low. -Resume  Hyzaar, hydralazine. -Advised her to follow-up with her outpatient physician as soon as possible to have a blood pressure monitored in the outpatient setting and medications adjusted accordingly.  Anxiety and depression: -Continue Wellbutrin, Remeron, Xanax as needed -Stable at this time.  She denies having any suicidal or homicidal ideations.  Chronic pain syndrome: Continue oxycodone  Restless leg: Continue home meds.  COPD: Stable -No wheezing noted on exam   Chronic diastolic CHF: Not in acute exacerbation. -Patient has chronic bilateral leg swelling.  She appears euvolemic. -Echo from 11/26/2018 reviewed which shows ejection fraction of 57 with grade 1 diastolic CHF. -Hold Lasix due to AKI and hypotension. -Advised to follow-up with her outpatient physician preferably within the week and medications adjusted accordingly.  Tobacco abuse: Patient mentioned she has quit smoking 3 weeks ago. - Advised to continue with cessation  Hyperlipidemia/claudication: Continue statin  Discharge Instructions Discharge plan of care discussed with the patient.  Answered all of her questions appropriately to the best of my knowledge.  She verbalized understanding and is wanting to be discharged home.  She is being discharged in stable condition.  Allergies as of 11/15/2019   No Known Allergies     Medication List    STOP taking these medications   amLODipine 5 MG tablet Commonly known as: NORVASC   clopidogrel 75 MG tablet Commonly known as: PLAVIX   furosemide 20 MG tablet Commonly known as: LASIX   potassium chloride 10 MEQ tablet Commonly known as: KLOR-CON     TAKE these medications   ALPRAZolam 1 MG tablet Commonly known as: XANAX as needed.   aspirin EC 81 MG tablet Take 81 mg by mouth daily. Swallow whole.   atorvastatin 10 MG tablet Commonly known as: LIPITOR TAKE 1 TABLET BY MOUTH EVERY DAY   buPROPion 150 MG 24 hr tablet Commonly known as: WELLBUTRIN  XL Take 150 mg by mouth daily.   gabapentin 100 MG capsule Commonly known as: NEURONTIN Take 100 mg by mouth 3 (three) times daily.   hydrALAZINE 25 MG tablet Commonly known as: APRESOLINE TAKE 1 TABLET BY MOUTH THREE TIMES A DAY   losartan-hydrochlorothiazide 100-12.5 MG tablet Commonly known as: HYZAAR Take 1 tablet by mouth daily.   mirtazapine 30 MG tablet Commonly known as: REMERON 30 mg daily.   OxyCONTIN 60 MG 12 hr tablet Generic drug: oxyCODONE Take 1 tablet by mouth every 8 (eight) hours.   rOPINIRole 1 MG tablet Commonly known as: REQUIP Take 1 mg by mouth at bedtime.       No Known Allergies  Consultations:  Palliative   Procedures/Studies: No results found.   Subjective: Her bleeding has completely stopped.  She denies having any major complaints at this time.  Blood pressure also improved.  Discharge Exam: Vitals:   11/15/19 0306 11/15/19 0755  BP:  (!) 141/55  Pulse:  (!) 56  Resp: 17 16  Temp:  97.9 F (36.6 C)  SpO2:  94%   Vitals:   11/15/19 0305 11/15/19 0306 11/15/19 0347 11/15/19 0755  BP:    (!) 141/55  Pulse:    (!) 56  Resp: 10 17  16   Temp:    97.9 F (36.6 C)  TempSrc:    Oral  SpO2:    94%  Weight:   56.3 kg  Height:        General: Pt is alert, awake, not in acute distress Cardiovascular: RRR, S1/S2 +, no rubs, no gallops Respiratory: CTA bilaterally, no wheezing, no rhonchi Abdominal: Soft, NT, ND, bowel sounds + Extremities: no edema, no cyanosis, left lower extremity bleeding is completely stopped.  No open lesions noted at this time.    The results of significant diagnostics from this hospitalization (including imaging, microbiology, ancillary and laboratory) are listed below for reference.     Microbiology: Recent Results (from the past 240 hour(s))  SARS Coronavirus 2 by RT PCR (hospital order, performed in [facility] hospital lab) Nasopharyngeal Nasopharyngeal Swab     Status: None   Collection Time:  11/14/19 12:14 PM   Specimen: Nasopharyngeal Swab  Result Value Ref Range Status   SARS Coronavirus 2 NEGATIVE NEGATIVE Final    Comment: (NOTE) SARS-CoV-2 target nucleic acids are NOT DETECTED.  The SARS-CoV-2 RNA is generally detectable in upper and lower respiratory specimens during the acute phase of infection. The lowest concentration of SARS-CoV-2 viral copies this assay can detect is 250 copies / mL. A negative result does not preclude SARS-CoV-2 infection and should not be used as the sole basis for treatment or other patient management decisions.  A negative result may occur with improper specimen collection / handling, submission of specimen other than nasopharyngeal swab, presence of viral mutation(s) within the areas targeted by this assay, and inadequate number of viral copies (<250 copies / mL). A negative result must be combined with clinical observations, patient history, and epidemiological information.  Fact Sheet for Patients:   StrictlyIdeas.no  Fact Sheet for Healthcare Providers: BankingDealers.co.za  This test is not yet approved or  cleared by the [place] FDA and has been authorized for detection and/or diagnosis of SARS-CoV-2 by FDA under an Emergency Use Authorization (EUA).  This EUA will remain in effect (meaning this test can be used) for the duration of the COVID-19 declaration under Section 564(b)(1) of the Act, 21 U.S.C. section 360bbb-3(b)(1), unless the authorization is terminated or revoked sooner.  Performed at [facility], [place] [address], [place], [place] [address]      Labs: BNP (last 3 results) Recent Labs    06/20/19 2132  BNP 07.3   Basic Metabolic Panel: Recent Labs  Lab 11/14/19 0906 11/14/19 2049 11/15/19 0740  NA 137  --  141  K 4.4  --  4.2  CL 101  --  105  CO2 25  --  28  GLUCOSE 204*  --  89  BUN 77*  --  53*  CREATININE 2.74*  --  1.49*  CALCIUM 9.6   --  9.3  MG  --  2.0  --   PHOS  --  3.2  --    Liver Function Tests: Recent Labs  Lab 11/14/19 0906 11/15/19 0740  AST 20 17  ALT 14 11  ALKPHOS 92 61  BILITOT 0.4 0.4  PROT 5.6* 4.7*  ALBUMIN 3.1* 2.7*   No results for input(s): LIPASE, AMYLASE in the last 168 hours. No results for input(s): AMMONIA in the last 168 hours. CBC: Recent Labs  Lab 11/14/19 0906 11/14/19 2049 11/15/19 0740  WBC 6.4 4.2 3.6*  NEUTROABS 3.5  --   --   HGB 7.1* 9.2* 8.1*  HCT 25.8* 30.2* 27.4*  MCV 74.6* 77.0* 77.8*  PLT 97* 79* 80*   Cardiac Enzymes: No results for input(s): CKTOTAL, CKMB, CKMBINDEX, TROPONINI in the last 168  hours. BNP: Invalid input(s): POCBNP CBG: No results for input(s): GLUCAP in the last 168 hours. D-Dimer No results for input(s): DDIMER in the last 72 hours. Hgb A1c No results for input(s): HGBA1C in the last 72 hours. Lipid Profile No results for input(s): CHOL, HDL, LDLCALC, TRIG, CHOLHDL, LDLDIRECT in the last 72 hours. Thyroid function studies No results for input(s): TSH, T4TOTAL, T3FREE, THYROIDAB in the last 72 hours.  Invalid input(s): FREET3 Anemia work up No results for input(s): VITAMINB12, FOLATE, FERRITIN, TIBC, IRON, RETICCTPCT in the last 72 hours. Urinalysis    Component Value Date/Time   COLORURINE STRAW (A) 06/21/2019 0310   APPEARANCEUR CLEAR 06/21/2019 0310   LABSPEC 1.008 06/21/2019 0310   PHURINE 5.0 06/21/2019 0310   GLUCOSEU NEGATIVE 06/21/2019 0310   HGBUR NEGATIVE 06/21/2019 0310   BILIRUBINUR NEGATIVE 06/21/2019 0310   KETONESUR NEGATIVE 06/21/2019 0310   PROTEINUR NEGATIVE 06/21/2019 0310   UROBILINOGEN 0.2 11/05/2014 0437   NITRITE NEGATIVE 06/21/2019 0310   LEUKOCYTESUR NEGATIVE 06/21/2019 0310   Sepsis Labs Invalid input(s): PROCALCITONIN,  WBC,  LACTICIDVEN Microbiology Recent Results (from the past 240 hour(s))  SARS Coronavirus 2 by RT PCR (hospital order, performed in [facility] hospital lab) Nasopharyngeal  Nasopharyngeal Swab     Status: None   Collection Time: 11/14/19 12:14 PM   Specimen: Nasopharyngeal Swab  Result Value Ref Range Status   SARS Coronavirus 2 NEGATIVE NEGATIVE Final    Comment: (NOTE) SARS-CoV-2 target nucleic acids are NOT DETECTED.  The SARS-CoV-2 RNA is generally detectable in upper and lower respiratory specimens during the acute phase of infection. The lowest concentration of SARS-CoV-2 viral copies this assay can detect is 250 copies / mL. A negative result does not preclude SARS-CoV-2 infection and should not be used as the sole basis for treatment or other patient management decisions.  A negative result may occur with improper specimen collection / handling, submission of specimen other than nasopharyngeal swab, presence of viral mutation(s) within the areas targeted by this assay, and inadequate number of viral copies (<250 copies / mL). A negative result must be combined with clinical observations, patient history, and epidemiological information.  Fact Sheet for Patients:   StrictlyIdeas.no  Fact Sheet for Healthcare Providers: BankingDealers.co.za  This test is not yet approved or  cleared by the [place] FDA and has been authorized for detection and/or diagnosis of SARS-CoV-2 by FDA under an Emergency Use Authorization (EUA).  This EUA will remain in effect (meaning this test can be used) for the duration of the COVID-19 declaration under Section 564(b)(1) of the Act, 21 U.S.C. section 360bbb-3(b)(1), unless the authorization is terminated or revoked sooner.  Performed at [facility], [place] [address], [place], [place] [address]      Time coordinating discharge: Over 30 minutes  SIGNED:   Yaakov Guthrie MD  [facility] 11/15/2019, 11:46 AM Pager on amion  If 7PM-7AM, please contact night-coverage [email or web address] Password [email or web address]

## 2019-11-15 NOTE — Plan of Care (Signed)
  Problem: Education: Goal: Ability to demonstrate management of disease process will improve Outcome: Progressing Goal: Ability to verbalize understanding of medication therapies will improve Outcome: Progressing   

## 2019-11-15 NOTE — Progress Notes (Signed)
Discharge and medication education given with teach back. Questions answered. Peripheral iv removed and dressing applied. Pt's belongings with pt: cell phone, clothes, shoes and charger.  Pt was transported in wheelchair to [place] entrance.

## 2019-11-21 ENCOUNTER — Telehealth: Payer: Self-pay

## 2019-11-21 NOTE — Telephone Encounter (Signed)
Spoke to pt today to let her know we have scheduled her to f/u with Dr. Oneida Alar on 7/14. She is aware she will see him and then may have another sclerotherapy treatment after seeing him. Pt is aware to bring compression stockings with her. She will call us back if anything changes in the mean time.

## 2019-12-02 ENCOUNTER — Telehealth: Payer: Self-pay

## 2019-12-03 ENCOUNTER — Ambulatory Visit (INDEPENDENT_AMBULATORY_CARE_PROVIDER_SITE_OTHER): Payer: Medicare Other

## 2019-12-03 ENCOUNTER — Other Ambulatory Visit: Payer: Self-pay

## 2019-12-03 ENCOUNTER — Ambulatory Visit (INDEPENDENT_AMBULATORY_CARE_PROVIDER_SITE_OTHER): Payer: Medicare Other | Admitting: Vascular Surgery

## 2019-12-03 ENCOUNTER — Encounter: Payer: Self-pay | Admitting: Vascular Surgery

## 2019-12-03 VITALS — BP 114/57 | HR 79 | Temp 97.9°F | Resp 18 | Ht 60.0 in | Wt 125.9 lb

## 2019-12-03 DIAGNOSIS — I83812 Varicose veins of left lower extremities with pain: Secondary | ICD-10-CM

## 2019-12-03 DIAGNOSIS — I8393 Asymptomatic varicose veins of bilateral lower extremities: Secondary | ICD-10-CM

## 2019-12-03 NOTE — Progress Notes (Signed)
Patient name: [name] MRN: [id number] DOB: 21-Mar-1946 Sex: female  HPI: [name] is a 74 y.o. female, who was previously seen in February 2021 for bleeding episode from varicose veins near her ankle.  She underwent a course of sclerotherapy March 2021.  She did well for a while and then had a recurrent bleed November 14, 2019.  She returns today for further follow-up.  She also has known peripheral arterial disease.  She has a known left superficial femoral artery occlusion from an arteriogram done by Dr. Einar Gip in the fall 2020.  Other medical problems include CKD 3 with baseline creatinine of around 1.5, COPD, hypertension all of which have been stable.  She is on aspirin and a statin.  Past Medical History:  Diagnosis Date   Acute renal failure superimposed on stage 3 chronic kidney disease ([place]) 10/27/2014   Back pain, chronic    Breast cancer ([place]) 09/09/10   Cancer of colon ([place]) 05/24/2011   Centrilobular emphysema ([place]) 07/15/2018   CKD (chronic kidney disease), stage III    Claudication, intermittent ([place]) 07/15/2018   Depression    GIB (gastrointestinal bleeding)    H/O tobacco use, presenting hazards to health Quit Dec 2019 05/03/2011   50 years, up to 3PPD quit last year.    Hx of varicose veins of lower extremity 07/15/2018   Hypertension    SDH (subdural hematoma) (HCC)    SOB (shortness of breath) 07/15/2018   Past Surgical History:  Procedure Laterality Date   ABDOMINAL AORTOGRAM W/LOWER EXTREMITY Left 03/18/2019   Procedure: ABDOMINAL AORTOGRAM W/LOWER EXTREMITY;  Surgeon: Adrian Prows, MD;  Location: [facility];  Service: Cardiovascular;  Laterality: Left;   BACK SURGERY     BREAST LUMPECTOMY  10/10/2010   Rt Breast   COLON SURGERY  04/2011   COLONOSCOPY  05/03/2011   Procedure: COLONOSCOPY;  Surgeon: Jeryl Columbia, MD;  Location: [facility] ENDOSCOPY;  Service: Endoscopy;  Laterality: N/A;   JOINT REPLACEMENT     R&L total shoulder  replacements   LUMBAR DISC SURGERY     PARTIAL HYSTERECTOMY     PERIPHERAL VASCULAR INTERVENTION  03/18/2019   Procedure: PERIPHERAL VASCULAR INTERVENTION;  Surgeon: Adrian Prows, MD;  Location: [facility];  Service: Cardiovascular;;  attempted left SFA   SHOULDER SURGERY     TUMOR REMOVAL  Tumor removed R breast    Family History  Problem Relation Age of Onset   Diabetes Mother    Hypertension Mother    Cancer Mother        leukemia   Sudden death Mother    Heart attack Mother    Anesthesia problems Neg Hx    Hypotension Neg Hx    Malignant hyperthermia Neg Hx    Pseudochol deficiency Neg Hx    Breast cancer Neg Hx     SOCIAL HISTORY: Social History   Socioeconomic History   Marital status: Divorced    Spouse name: Not on file   Number of children: 2   Years of education: Not on file   Highest education level: Not on file  Occupational History   Not on file  Tobacco Use   Smoking status: Current Every Day Smoker    Packs/day: 0.25    Years: 30.00    Pack years: 7.50    Types: Cigarettes   Smokeless tobacco: Former Systems developer    Quit date: 04/22/2010  Vaping Use   Vaping Use: Never used  Substance and  Sexual Activity   Alcohol use: Yes    Comment: rarely    Drug use: No   Sexual activity: Not Currently  Other Topics Concern   Not on file  Social History Narrative   Lives alone in a one story home.  Has 2 children.     On disability for low back pain since the age of 41.     Education: high school.   Social Determinants of Health   Financial Resource Strain:    Difficulty of Paying Living Expenses:   Food Insecurity:    Worried About Charity fundraiser in the Last Year:    Arboriculturist in the Last Year:   Transportation Needs:    Film/video editor (Medical):    Lack of Transportation (Non-Medical):   Physical Activity:    Days of Exercise per Week:    Minutes of Exercise per Session:   Stress:    Feeling  of Stress :   Social Connections:    Frequency of Communication with Friends and Family:    Frequency of Social Gatherings with Friends and Family:    Attends Religious Services:    Active Member of Clubs or Organizations:    Attends Music therapist:    Marital Status:   Intimate Partner Violence:    Fear of Current or Ex-Partner:    Emotionally Abused:    Physically Abused:    Sexually Abused:     No Known Allergies  Current Outpatient Medications  Medication Sig Dispense Refill   ALPRAZolam (XANAX) 1 MG tablet as needed.      aspirin EC 81 MG tablet Take 81 mg by mouth daily. Swallow whole.     atorvastatin (LIPITOR) 10 MG tablet TAKE 1 TABLET BY MOUTH EVERY DAY (Patient taking differently: Take 10 mg by mouth daily. ) 90 tablet 1   buPROPion (WELLBUTRIN XL) 150 MG 24 hr tablet Take 150 mg by mouth daily.     gabapentin (NEURONTIN) 100 MG capsule Take 100 mg by mouth 3 (three) times daily.     losartan-hydrochlorothiazide (HYZAAR) 100-12.5 MG tablet Take 1 tablet by mouth daily. 90 tablet 3   mirtazapine (REMERON) 30 MG tablet 30 mg daily.      oxyCODONE (OXYCONTIN) 60 MG 12 hr tablet Take 1 tablet by mouth every 8 (eight) hours.     rOPINIRole (REQUIP) 1 MG tablet Take 1 mg by mouth at bedtime.     hydrALAZINE (APRESOLINE) 25 MG tablet TAKE 1 TABLET BY MOUTH THREE TIMES A DAY (Patient not taking: Reported on 12/03/2019) 270 tablet 3   No current facility-administered medications for this visit.    ROS:   General:  No weight loss, Fever, chills  HEENT: No recent headaches, no nasal bleeding, no visual changes, no sore throat  Neurologic: No dizziness, blackouts, seizures. No recent symptoms of stroke or mini- stroke. No recent episodes of slurred speech, or temporary blindness.  Cardiac: No recent episodes of chest pain/pressure, no shortness of breath at rest.  No shortness of breath with exertion.  Denies history of atrial fibrillation or  irregular heartbeat  Vascular: No history of rest pain in feet.  No history of claudication.  No history of non-healing ulcer, No history of DVT   Pulmonary: No home oxygen, no productive cough, no hemoptysis,  No asthma or wheezing  Musculoskeletal:  [ ]  Arthritis, [ ]  Low back pain,  [ ]  Joint pain  Hematologic:No history of hypercoagulable state.  No history of easy bleeding.  No history of anemia  Gastrointestinal: No hematochezia or melena,  No gastroesophageal reflux, no trouble swallowing  Urinary: [X]  chronic Kidney disease, [ ]  on HD - [ ]  MWF or [ ]  TTHS, [ ]  Burning with urination, [ ]  Frequent urination, [ ]  Difficulty urinating;   Skin: No rashes  Psychological: No history of anxiety,  No history of depression   Physical Examination  Vitals:   12/03/19 1438 12/03/19 1439  BP: 133/67 (!) 114/57  Pulse: 79   Resp: 18   Temp: 97.9 F (36.6 C)   TempSrc: Temporal   SpO2: 93%   Weight: 125 lb 14.4 oz (57.1 kg)   Height: 5' (1.524 m)     Body mass index is 24.59 kg/m.  General:  Alert and oriented, no acute distress HEENT: Normal Neck: No JVD Cardiac: Regular Rate and Rhythm Skin: No rash, multiple superficial varicosities around the ankles and feet bilaterally.  There was a 1 cm dry eschar over the left lateral malleolus which was hanging freely.  I pulled this off and it also removed a Vicryl stitch.  There was a 1 mm ulceration over the left lateral malleolus under this.  There is a palpable varicosity feeding this on the proximal and distal aspects of this ulcer. Extremity Pulses: Absent dorsalis pedis, posterior tibial pulses bilaterally Musculoskeletal: No deformity or edema  Neurologic: Upper and lower extremity motor 5/5 and symmetric  DATA: I reviewed the patient's arteriogram which was done mostly with carbon dioxide in October of last year.  Those images show a left superficial femoral artery occlusion.  ASSESSMENT: Recurrent bleeding episode from  varicose vein left lateral malleolus   PLAN: Sclerotherapy of feeding veins today to try to prevent recurrent bleeding episodes.  The patient does have a known left superficial femoral artery occlusion and now has about a 2 mm ulceration on the left lateral malleolus.  If this fails to heal we would need to consider repeat arteriogram possible revascularization.   Ruta Hinds, MD [facility] Office: [phone] Pager: [phone]

## 2019-12-03 NOTE — Progress Notes (Signed)
Treated pt's L ankle feeder veins for sclerotherapy, administered with a 27g butterfly.  Patient received a total of 45mL of Asclera 1%. Dr. Oneida Alar saw pt prior to sclerotherapy treatment and removed small piece of suture that was over small ulcer that has had issues with healing and recurrent bleed. This area has been extremely tender to the touch and pt had difficulty tolerating sclerotherapy.  She was very jumpy and in pain. Post procedure care instructions provided both verbally and on handout. Will follow PRN.    Photos: Yes.    Compression stockings applied: Yes.

## 2019-12-04 ENCOUNTER — Encounter: Payer: Self-pay | Admitting: Cardiology

## 2019-12-04 ENCOUNTER — Ambulatory Visit: Payer: Medicare Other | Admitting: Cardiology

## 2019-12-04 VITALS — BP 104/43 | HR 50 | Resp 15 | Ht 60.0 in | Wt 126.0 lb

## 2019-12-04 DIAGNOSIS — I739 Peripheral vascular disease, unspecified: Secondary | ICD-10-CM

## 2019-12-04 DIAGNOSIS — N1832 Chronic kidney disease, stage 3b: Secondary | ICD-10-CM

## 2019-12-04 DIAGNOSIS — I872 Venous insufficiency (chronic) (peripheral): Secondary | ICD-10-CM

## 2019-12-04 DIAGNOSIS — I10 Essential (primary) hypertension: Secondary | ICD-10-CM

## 2019-12-04 NOTE — Progress Notes (Signed)
Primary Physician/Referring:  Maude Leriche, PA-C  Patient ID: [name], female    DOB: [birth year], 74 y.o.   MRN: [id number]  Chief complaints: hospital follow up  HPI: [name]  is a 74 y.o. female  with peripheral artery disease has known long left SFA CTO with failed attempt in September 2020 for revascularization, also has right calcific iliac artery stenosis which was felt to be high risk due to extension into right common femoral artery for dissection.  Past medical history significant for tobacco use disorder, which she quit on October 21, 2019, hypertension, COPD,Stage III chronic kidney disease, history of breast cancer. She has had bilateral venous insufficiency and has had ablation in the past, former tobacco use quit since December 2019.   She has right hip claudication and also left leg claudication that is chronic but has remained stable.  Patient was admitted to the hospital on 11/14/2019 and discharged the following day when she presented with variceal bleeding in the lower extremity with hemorrhagic shock and acute renal failure and received 2 units of packed RBCs.  Renal function also improved to baseline and her antihypertensive medications including Lasix and amlodipine was discontinued but continued on Hyzaar and hydralazine.  She now presents for a 38-month office visit.  She was also evaluated by Dr. Ruta Hinds yesterday that is 12/03/2019, he performed sclerotherapy of the feeding veins.  She is presently feeling better, still has pain in her left foot at the site of variceal bleed.  Hip pain has improved since being on Neurontin.   Past Medical History:  Diagnosis Date  . Acute renal failure superimposed on stage 3 chronic kidney disease ([place]) 10/27/2014  . Back pain, chronic   . Breast cancer ([place]) 09/09/10  . Cancer of colon ([place]) 05/24/2011  . Centrilobular emphysema ([place]) 07/15/2018  . CKD (chronic kidney disease), stage III   . Claudication,  intermittent ([place]) 07/15/2018  . Depression   . GIB (gastrointestinal bleeding)   . H/O tobacco use, presenting hazards to health Quit Dec 2019 05/03/2011   50 years, up to 3PPD quit last year.   Marland Kitchen Hx of varicose veins of lower extremity 07/15/2018  . Hypertension   . SDH (subdural hematoma) ([place])   . SOB (shortness of breath) 07/15/2018    Past Surgical History:  Procedure Laterality Date  . ABDOMINAL AORTOGRAM W/LOWER EXTREMITY Left 03/18/2019   Procedure: ABDOMINAL AORTOGRAM W/LOWER EXTREMITY;  Surgeon: Adrian Prows, MD;  Location: [facility];  Service: Cardiovascular;  Laterality: Left;  . BACK SURGERY    . BREAST LUMPECTOMY  10/10/2010   Rt Breast  . COLON SURGERY  04/2011  . COLONOSCOPY  05/03/2011   Procedure: COLONOSCOPY;  Surgeon: Jeryl Columbia, MD;  Location: [facility];  Service: Endoscopy;  Laterality: N/A;  . JOINT REPLACEMENT     R&L total shoulder replacements  . LUMBAR DISC SURGERY    . PARTIAL HYSTERECTOMY    . PERIPHERAL VASCULAR INTERVENTION  03/18/2019   Procedure: PERIPHERAL VASCULAR INTERVENTION;  Surgeon: Adrian Prows, MD;  Location: [facility];  Service: Cardiovascular;;  attempted left SFA  . SHOULDER SURGERY    . TUMOR REMOVAL  Tumor removed R breast   Social History   Tobacco Use  . Smoking status: Former Smoker    Packs/day: 0.25    Years: 30.00    Pack years: 7.50    Types: Cigarettes    Quit date: 10/31/2019    Years since quitting:  0.0  . Smokeless tobacco: Former Systems developer    Quit date: 04/22/2010  Substance Use Topics  . Alcohol use: Yes    Comment: rarely   Marital Status: Divorced   Review of Systems  Constitutional: Positive for malaise/fatigue.  Cardiovascular: Positive for claudication, dyspnea on exertion and leg swelling. Negative for chest pain, orthopnea, palpitations and syncope.  Musculoskeletal: Positive for arthritis and back pain.  All other systems reviewed and are negative.     Objective  Blood pressure (!)  104/43, pulse (!) 50, resp. rate 15, height 5' (1.524 m), weight 126 lb (57.2 kg), SpO2 94 %. Body mass index is 24.61 kg/m.    Physical Exam Vitals reviewed.  Constitutional:      Appearance: She is well-developed.  Cardiovascular:     Rate and Rhythm: Normal rate and regular rhythm.     Pulses: Intact distal pulses.          Femoral pulses are 2+ on the right side with bruit.      Popliteal pulses are 1+ on the right side and 1+ on the left side.       Dorsalis pedis pulses are 1+ on the right side and 0 on the left side.       Posterior tibial pulses are 1+ on the right side and 0 on the left side.     Heart sounds: Normal heart sounds.     Comments: Mild Erythema bilateral R>L. Left medial malleolus shows a small scar like lesion that does not appear ischemia. No discharge.  No leg edema Pulmonary:     Effort: Pulmonary effort is normal. No accessory muscle usage or respiratory distress.     Breath sounds: Normal breath sounds.  Abdominal:     General: Bowel sounds are normal.     Palpations: Abdomen is soft.  Skin:    Findings: No erythema.    Radiology: No results found.  Laboratory examination:    CMP Latest Ref Rng & Units 11/15/2019 11/14/2019 07/14/2019  Glucose 70 - 99 mg/dL 89 204(H) 88  BUN 8 - 23 mg/dL 53(H) 77(H) 43(H)  Creatinine 0.44 - 1.00 mg/dL 1.49(H) 2.74(H) 1.39(H)  Sodium 135 - 145 mmol/L 141 137 140  Potassium 3.5 - 5.1 mmol/L 4.2 4.4 4.9  Chloride 98 - 111 mmol/L 105 101 100  CO2 22 - 32 mmol/L 28 25 26   Calcium 8.9 - 10.3 mg/dL 9.3 9.6 10.6(H)  Total Protein 6.5 - 8.1 g/dL 4.7(L) 5.6(L) -  Total Bilirubin 0.3 - 1.2 mg/dL 0.4 0.4 -  Alkaline Phos 38 - 126 U/L 61 92 -  AST 15 - 41 U/L 17 20 -  ALT 0 - 44 U/L 11 14 -   CBC Latest Ref Rng & Units 11/15/2019 11/14/2019 11/14/2019  WBC 4.0 - 10.5 K/uL 3.6(L) 4.2 6.4  Hemoglobin 12.0 - 15.0 g/dL 8.1(L) 9.2(L) 7.1(L)  Hematocrit 36 - 46 % 27.4(L) 30.2(L) 25.8(L)  Platelets 150 - 400 K/uL 80(L) 79(L)  97(L)   Lipid Panel     Component Value Date/Time   CHOL 132 06/12/2019 1502   TRIG 62 06/12/2019 1502   HDL 73 06/12/2019 1502   CHOLHDL 1.8 06/12/2019 1502   LDLCALC 46 06/12/2019 1502   HEMOGLOBIN A1C Lab Results  Component Value Date   HGBA1C 6.1 (H) 10/28/2014   MPG 128 10/28/2014   TSH Recent Labs    06/21/19 0050  TSH 1.221    PRN Meds:. There are no discontinued medications. Current Meds  Medication Sig  . ALPRAZolam (XANAX) 1 MG tablet as needed.   Marland Kitchen amLODipine (NORVASC) 5 MG tablet Take 5 mg by mouth daily.  Marland Kitchen aspirin EC 81 MG tablet Take 81 mg by mouth daily. Swallow whole.  Marland Kitchen atorvastatin (LIPITOR) 10 MG tablet TAKE 1 TABLET BY MOUTH EVERY DAY (Patient taking differently: Take 10 mg by mouth daily. )  . buPROPion (WELLBUTRIN XL) 150 MG 24 hr tablet Take 150 mg by mouth daily.  . furosemide (LASIX) 20 MG tablet Take 20 mg by mouth.  . gabapentin (NEURONTIN) 100 MG capsule Take 100 mg by mouth 3 (three) times daily.  . hydrALAZINE (APRESOLINE) 25 MG tablet TAKE 1 TABLET BY MOUTH THREE TIMES A DAY  . losartan-hydrochlorothiazide (HYZAAR) 100-12.5 MG tablet Take 1 tablet by mouth daily.  . mirtazapine (REMERON) 30 MG tablet 30 mg daily.   Marland Kitchen oxyCODONE (OXYCONTIN) 60 MG 12 hr tablet Take 1 tablet by mouth every 8 (eight) hours.  . potassium chloride (MICRO-K) 10 MEQ CR capsule Take 10 mEq by mouth 2 (two) times daily.  Marland Kitchen rOPINIRole (REQUIP) 1 MG tablet Take 1 mg by mouth at bedtime.    Cardiac Studies:   01/11/2015: Hospital Lower extremity arterial duplex Evidence of multilevel arterial occlusive disease bilaterally, left greater than right. Resting ankle-brachial index is mildly depressed on the right at 0.85. The left ABI is moderately depressed at rest at 0.65. On the right side, there likely is a component of aortoiliac inflow disease given monophasic waveforms throughout.  Echocardiogram 11/26/2018: Normal LV systolic function with EF 57%. Left ventricle  cavity is normal in size. Normal global wall motion. Doppler evidence of grade I (impaired) diastolic dysfunction, normal LAP. Calculated EF 57%. Left atrial cavity is mildly dilated. Mild (Grade I) mitral regurgitation. Mild to moderate tricuspid regurgitation. Estimated pulmonary artery systolic pressure is 71-06 mmHg. IVC is dilated with blunted respiratory response. Estimated RA pressure 10-15 mmHg.  Lower Extremity Arterial Duplex 01/14/2019: There is monophasic waveform noted in the right external iliac and CFA suggests proximal significant disease. There is severe diffuse mixed plaque noted throughout the right lower extremity.  Moderate velocity increase at the left distal superficial femoral artery, >50% stenosis. There is severe diffuse mixed plaque throughout the left lower extremity This exam reveals moderately decreased perfusion of the right lower extremity, noted at the dorsalis pedis artery level (ABI 0.75) and moderately decreased perfusion of the left lower extremity, noted at the post tibial artery level (ABI 0.58).  No significant change from report of 01/11/2015.   Peripheral arteriogram 03/18/2019:  Dist R EIA to Prox R CFA lesion is 60% stenosed.  Prox R CIA to Mid R EIA lesion is 20% stenosed.  Prox L CIA to Mid L CFA lesion is 20% stenosed.  Prox L SFA to Dist L SFA lesion is 100% stenosed.  Three-vessel runoff below the knee on the left with brisk  flow.  Failed attempt at angioplasty of the left SFA.   Suprarenal Abd AO to Infrarenal Abd AO lesion is 30% stenosed. Severely calcified lesions.  Abdominal aortogram also reveals patent renal arteries 1 on either side. There is no evidence of abdominal aortic aneurysm.  EKG:  EKG 12/04/2019: Marked sinus bradycardia at the rate of 46 bpm, normal axis, right bundle branch block.  No evidence of ischemia.   EKG 06/16/2019: Marked sinus bradycardia at 45 bpm, norma axis, IRBBB  Assessment     ICD-10-CM   1. PAD  (peripheral artery disease) ([place])  I73.9   2. Venous insufficiency of both lower extremities  I87.2   3. Essential hypertension  I10 EKG 12-Lead  4. CKD (chronic kidney disease) stage 4, GFR 15-29 ml/min (HCC)  N18.4      Recommendations:   [name]  is a 74 y.o. female  with peripheral artery disease has known long left SFA CTO with failed attempt in September 2020 for revascularization, also has right calcific iliac artery stenosis which was felt to be high risk due to extension into right common femoral artery for dissection.  Past medical history significant for tobacco use disorder, which she quit on October 21, 2019, hypertension, COPD,Stage III chronic kidney disease, history of breast cancer. She has had bilateral venous insufficiency and has had ablation in the past, former tobacco use quit since December 2019.   She has right hip claudication and also left leg claudication that is chronic but has remained stable.  I have advised her that she should use support stockings regularly.  Without this she will continue to have variceal bleed from her legs.  With regard to peripheral arterial disease, symptoms of claudication are remained stable.  There is no limb threatening ischemia.  Her capillary refill is less than 2 seconds, hence continue medical therapy for now.  I do not see any open wounds.  Hypertension is now well controlled.  She has asymptomatic sinus bradycardia and had discontinued metoprolol on her last office visit but no change in heart rate.  We will continue to monitor this clinically.  Renal function has remained stable.  Lipids are well controlled.  She quit smoking a month ago and I encouraged her to remain abstinent.  I will see her back in 6 months for follow-up.  This was a 40-minute encounter with review of hospital records, outside records and evaluation of procedures.   Adrian Prows, MD, [facility] 12/04/2019, 1:46 PM Office: [phone]

## 2020-01-02 ENCOUNTER — Other Ambulatory Visit: Payer: Self-pay

## 2020-01-02 ENCOUNTER — Ambulatory Visit
Admission: EM | Admit: 2020-01-02 | Discharge: 2020-01-02 | Disposition: A | Discharge status: Home / Self Care | Payer: Medicare Other

## 2020-01-02 DIAGNOSIS — R5383 Other fatigue: Secondary | ICD-10-CM

## 2020-01-02 DIAGNOSIS — W19XXXA Unspecified fall, initial encounter: Secondary | ICD-10-CM

## 2020-01-02 NOTE — ED Provider Notes (Signed)
[name]    CSN: [id number] Arrival date & time: 01/02/20  1419      History   Chief Complaint Chief Complaint  Patient presents with  . Fall    HPI [name] is a 74 y.o. female with extensive medical history as outlined below presenting for evaluation s/p fall.  Patient is accompanied by her girlfriend who did not witness fall.  This occurred 3 days ago: No head trauma, LOC.  Patient states that she was standing at the counter when she fell asleep.  Has been dealing with chronic fatigue, for which she is seeing a sleep specialist next month.  Denies chest pain, palpitations, difficulty breathing.  Girlfriend denies change in memory, irritability, behavior, activity or appetite level.  No bruising or bleeding.  Patient states "I feel fine ".  Denies anticoagulant use.    Past Medical History:  Diagnosis Date  . Acute renal failure superimposed on stage 3 chronic kidney disease ([place]) 10/27/2014  . Back pain, chronic   . Breast cancer ([place]) 09/09/10  . Cancer of colon ([place]) 05/24/2011  . Centrilobular emphysema ([place]) 07/15/2018  . CKD (chronic kidney disease), stage III   . Claudication, intermittent ([place]) 07/15/2018  . Depression   . GIB (gastrointestinal bleeding)   . H/O tobacco use, presenting hazards to health Quit Dec 2019 05/03/2011   50 years, up to 3PPD quit last year.   Marland Kitchen Hx of varicose veins of lower extremity 07/15/2018  . Hypertension   . SDH (subdural hematoma) ([place])   . SOB (shortness of breath) 07/15/2018    Patient Active Problem List   Diagnosis Date Noted  . Acute blood loss anemia 11/14/2019  . Shock ([place]) 06/22/2019  . Shock circulatory ([place]) 06/21/2019  . Ruptured varicose vein 06/21/2019  . COPD (chronic obstructive pulmonary disease) ([place]) 06/21/2019  . Chronic diastolic CHF (congestive heart failure) ([place]) 06/21/2019  . SOB (shortness of breath) 07/15/2018  . Laboratory examination 07/15/2018  . Claudication, intermittent ([place])  07/15/2018  . Centrilobular emphysema ([place]) 07/15/2018  . Hx of varicose veins of lower extremity 07/15/2018  . Back pain 01/21/2016  . Sacroiliac joint dysfunction of left side 12/27/2015  . History of colon cancer 09/21/2015  . Iron deficiency anemia 06/04/2015  . Syncope 11/05/2014  . Narcotic addiction ([place])   . Chronic pain 10/27/2014  . Faintness   . Frequent falls 08/22/2014  . Facial contusion 08/22/2014  . Subdural hematoma ([place]) 08/21/2014  . Acute kidney injury superimposed on CKD ([place]) 01/20/2013  . Acute mastitis of right breast 04/05/2012  . Right shoulder pain 03/27/2012  . Breast erythema most likely secondary to mastitis / cellulitis 03/24/2012  . Hyponatremia 03/24/2012  . Cancer of colon ([place]) 05/24/2011  . Cancer of right breast, stage 1 ([place]) 05/24/2011  . Colonic mass 05/03/2011  . H/O tobacco use, presenting hazards to health 05/03/2011  . Weakness generalized 05/01/2011  . Anemia 05/01/2011  . Falls frequently 05/01/2011  . GI bleed 05/01/2011  . Hypokalemia 05/01/2011  . HTN (hypertension) 05/01/2011  . Anxiety and depression 05/01/2011  . Lower extremity edema 05/01/2011    Past Surgical History:  Procedure Laterality Date  . ABDOMINAL AORTOGRAM W/LOWER EXTREMITY Left 03/18/2019   Procedure: ABDOMINAL AORTOGRAM W/LOWER EXTREMITY;  Surgeon: Adrian Prows, MD;  Location: [facility];  Service: Cardiovascular;  Laterality: Left;  . BACK SURGERY    . BREAST LUMPECTOMY  10/10/2010   Rt Breast  . COLON SURGERY  04/2011  .  COLONOSCOPY  05/03/2011   Procedure: COLONOSCOPY;  Surgeon: Jeryl Columbia, MD;  Location: [facility];  Service: Endoscopy;  Laterality: N/A;  . JOINT REPLACEMENT     R&L total shoulder replacements  . LUMBAR DISC SURGERY    . PARTIAL HYSTERECTOMY    . PERIPHERAL VASCULAR INTERVENTION  03/18/2019   Procedure: PERIPHERAL VASCULAR INTERVENTION;  Surgeon: Adrian Prows, MD;  Location: [facility];  Service: Cardiovascular;;   attempted left SFA  . SHOULDER SURGERY    . TUMOR REMOVAL  Tumor removed R breast    OB History   No obstetric history on file.      Home Medications    Prior to Admission medications   Medication Sig Start Date End Date Taking? Authorizing Provider  ALPRAZolam Duanne Moron) 1 MG tablet as needed.  04/22/16   [provider]  amLODipine (NORVASC) 5 MG tablet Take 5 mg by mouth daily.    [provider]  aspirin EC 81 MG tablet Take 81 mg by mouth daily. Swallow whole.    [provider]  atorvastatin (LIPITOR) 10 MG tablet TAKE 1 TABLET BY MOUTH EVERY DAY Patient taking differently: Take 10 mg by mouth daily.  06/03/19   Miquel Dunn, NP  buPROPion (WELLBUTRIN XL) 150 MG 24 hr tablet Take 150 mg by mouth daily.    [provider]  furosemide (LASIX) 20 MG tablet Take 20 mg by mouth.    [provider]  gabapentin (NEURONTIN) 100 MG capsule Take 100 mg by mouth 3 (three) times daily. 04/01/19   [provider]  hydrALAZINE (APRESOLINE) 25 MG tablet TAKE 1 TABLET BY MOUTH THREE TIMES A DAY 08/18/19   Miquel Dunn, NP  losartan-hydrochlorothiazide (HYZAAR) 100-12.5 MG tablet Take 1 tablet by mouth daily. 03/21/19   Adrian Prows, MD  mirtazapine (REMERON) 30 MG tablet 30 mg daily.  04/22/16   [provider]  oxyCODONE (OXYCONTIN) 60 MG 12 hr tablet Take 1 tablet by mouth every 8 (eight) hours.    [provider]  potassium chloride (MICRO-K) 10 MEQ CR capsule Take 10 mEq by mouth 2 (two) times daily.    [provider]  rOPINIRole (REQUIP) 1 MG tablet Take 1 mg by mouth at bedtime.    [provider]    Family History Family History  Problem Relation Age of Onset  . Diabetes Mother   . Hypertension Mother   . Cancer Mother        leukemia  . Sudden death Mother   . Heart attack Mother   . Anesthesia problems Neg Hx   . Hypotension Neg Hx   . Malignant hyperthermia Neg Hx   .  Pseudochol deficiency Neg Hx   . Breast cancer Neg Hx     Social History Social History   Tobacco Use  . Smoking status: Former Smoker    Packs/day: 0.25    Years: 30.00    Pack years: 7.50    Types: Cigarettes    Quit date: 10/31/2019    Years since quitting: 0.1  . Smokeless tobacco: Former Systems developer    Quit date: 04/22/2010  Vaping Use  . Vaping Use: Never used  Substance Use Topics  . Alcohol use: Yes    Comment: rarely   . Drug use: No     Allergies   Patient has no known allergies.   Review of Systems As per HPI   Physical Exam Triage Vital Signs ED Triage Vitals  Enc Vitals Group     BP      Pulse      Resp      Temp      Temp src      SpO2      Weight      Height      Head Circumference      Peak Flow      Pain Score      Pain Loc      Pain Edu?      Excl. in [place]?    No data found.  Updated Vital Signs BP (!) 118/49   Pulse 75   Temp (!) 97.2 F (36.2 C)   Resp 16   SpO2 (!) 88%   Visual Acuity Right Eye Distance:   Left Eye Distance:   Bilateral Distance:    Right Eye Near:   Left Eye Near:    Bilateral Near:     Physical Exam Constitutional:      General: She is not in acute distress.    Appearance: She is not ill-appearing.  HENT:     Head: Normocephalic and atraumatic.     Right Ear: Tympanic membrane, ear canal and external ear normal.     Left Ear: Tympanic membrane, ear canal and external ear normal.     Mouth/Throat:     Mouth: Mucous membranes are moist.     Pharynx: Oropharynx is clear.  Eyes:     General: No scleral icterus.    Extraocular Movements: Extraocular movements intact.     Conjunctiva/sclera: Conjunctivae normal.     Pupils: Pupils are equal, round, and reactive to light.  Cardiovascular:     Rate and Rhythm: Normal rate.  Pulmonary:     Effort: Pulmonary effort is normal. No respiratory distress.     Breath sounds: No wheezing.     Comments: SpO2 94% at bedside Musculoskeletal:        General: No  tenderness or deformity. Normal range of motion.     Cervical back: Normal range of motion. No rigidity or tenderness.  Lymphadenopathy:     Cervical: No cervical adenopathy.  Skin:    Capillary Refill: Capillary refill takes less than 2 seconds.     Coloration: Skin is not jaundiced.     Findings: No bruising or rash.  Neurological:     General: No focal deficit present.     Mental Status: She is alert.     Cranial Nerves: Cranial nerves are intact.     Sensory: Sensation is intact.     Motor: Motor function is intact.     Coordination: Coordination is intact.     Gait: Gait is intact.  Psychiatric:        Mood and Affect: Mood normal.        Behavior: Behavior normal.      UC Treatments / Results  Labs (all labs ordered are listed, but only abnormal results are displayed) Labs Reviewed - No data to display  EKG   Radiology No results found.  Procedures Procedures (including critical care time)  Medications Ordered in UC Medications - No data to display  Initial Impression / Assessment and Plan / UC Course  I have reviewed the triage vital signs and the nursing notes.  Pertinent labs & imaging results that were available during my care of the patient were reviewed by me and considered in my medical decision making (see chart for details).     Patient  febrile, nontoxic in office today.  No neurocognitive deficit on exam.  Patient initially hypoxic, though more towards patient baseline at time of discharge (94%): Does have history of COPD.  Offered chest x-ray: Declined.  We will continue follow-up with sleep and PCP, go to ER for worsening symptoms. Final Clinical Impressions(s) / UC Diagnoses   Final diagnoses:  Fatigue, unspecified type  Fall, initial encounter     Discharge Instructions     Very important to keep follow-ups with sleep doctor, family medicine doctor. Go to ER for any difficulty breathing, chest pain, palpitations, weakness, dizziness,  change in behavior/irritability, memory loss.    ED Prescriptions    None     PDMP not reviewed this encounter.   [place], [place], [place] 01/02/20 1552

## 2020-01-02 NOTE — ED Triage Notes (Signed)
Pt sent by PCP for evaluation after "falling asleep standing at counter and hitting head"

## 2020-01-02 NOTE — Discharge Instructions (Addendum)
Very important to keep follow-ups with sleep doctor, family medicine doctor. Go to ER for any difficulty breathing, chest pain, palpitations, weakness, dizziness, change in behavior/irritability, memory loss.

## 2020-01-08 ENCOUNTER — Other Ambulatory Visit: Payer: Self-pay | Admitting: Cardiology

## 2020-01-08 DIAGNOSIS — I739 Peripheral vascular disease, unspecified: Secondary | ICD-10-CM

## 2020-01-14 ENCOUNTER — Other Ambulatory Visit: Payer: Self-pay | Admitting: Cardiology

## 2020-01-14 DIAGNOSIS — I10 Essential (primary) hypertension: Secondary | ICD-10-CM

## 2020-01-16 ENCOUNTER — Other Ambulatory Visit: Payer: Self-pay | Admitting: Cardiology

## 2020-02-04 ENCOUNTER — Other Ambulatory Visit: Payer: Self-pay

## 2020-02-04 MED ORDER — FUROSEMIDE 20 MG PO TABS: 20.0000 mg | ORAL_TABLET | Freq: Two times a day (BID) | ORAL | 3 refills | Status: DC

## 2020-02-17 ENCOUNTER — Other Ambulatory Visit: Payer: Self-pay | Admitting: Physician Assistant

## 2020-02-19 ENCOUNTER — Other Ambulatory Visit: Payer: Self-pay | Admitting: Physician Assistant

## 2020-02-19 DIAGNOSIS — N644 Mastodynia: Secondary | ICD-10-CM

## 2020-02-23 ENCOUNTER — Telehealth: Payer: Self-pay

## 2020-02-23 NOTE — Telephone Encounter (Signed)
Patient called with c/o pain in R leg near the knee cap that started this week. Wants Estell Harpin to call her back. Patient instructed to elevate leg and wear compression stockings and to call back if pain worsens. Left message with Izora Gala to call back when she is in the office. Patient verbalized understanding.

## 2020-02-23 NOTE — Telephone Encounter (Signed)
Pt called with c/o R knee to shin (anterior and behind knee) pain with swelling x 2-3 days. She denies redness; states it felt warm yesterday. She is unable to be seen this week and wants to wait until next week, as she has workers at her house this week. MD has been made aware. Will call her to schedule an appt after hearing from MD.

## 2020-02-24 ENCOUNTER — Telehealth: Payer: Self-pay

## 2020-02-24 ENCOUNTER — Other Ambulatory Visit: Payer: Self-pay | Admitting: *Deleted

## 2020-02-24 DIAGNOSIS — M79604 Pain in right leg: Secondary | ICD-10-CM

## 2020-02-24 NOTE — Telephone Encounter (Signed)
I spoke with pt after hearing from MD yesterday and explained to her the importance of having an U/S on her leg ASAP. Pt was offered a next day appt and she refused, saying she had workers at her home this week and could not leave. Today she was offered an appt for this week (wed, thurs, or fri) to which she refused again stating she could not leave her home. She has made a 10/13 appt. We have discussed importance of her going to ED if she develops increasing pain, swelling, redness, or warmth. Pt verbalized understanding.

## 2020-03-03 ENCOUNTER — Ambulatory Visit (HOSPITAL_COMMUNITY)
Admission: RE | Admit: 2020-03-03 | Discharge: 2020-03-03 | Disposition: A | Discharge status: Home / Self Care | Payer: Medicare Other | Source: Ambulatory Visit | Attending: Physician Assistant | Admitting: Physician Assistant

## 2020-03-03 ENCOUNTER — Encounter: Payer: Self-pay | Admitting: Physician Assistant

## 2020-03-03 ENCOUNTER — Ambulatory Visit (INDEPENDENT_AMBULATORY_CARE_PROVIDER_SITE_OTHER): Payer: Medicare Other | Admitting: Physician Assistant

## 2020-03-03 ENCOUNTER — Other Ambulatory Visit: Payer: Self-pay

## 2020-03-03 VITALS — BP 161/79 | HR 78 | Temp 96.4°F | Resp 20 | Ht 60.0 in | Wt 129.8 lb

## 2020-03-03 DIAGNOSIS — M79604 Pain in right leg: Secondary | ICD-10-CM

## 2020-03-03 DIAGNOSIS — I83893 Varicose veins of bilateral lower extremities with other complications: Secondary | ICD-10-CM

## 2020-03-03 NOTE — Progress Notes (Signed)
[facility] [place]     History of Present Illness  [name] is a 74 y.o. female who presents with chief complaint: was new posterior right knee pain and swelling.  She has come into today for DVT study to r/o possible DVT.  She denise history of DVT.    She has a history of varicose veins with bleeding episode and swelling in B LE's.  She previously had had a stripping of her left greater saphenous vein many years ago.  Of note the patient also has a history of peripheral arterial disease and was recently noted on arteriogram by Dr. 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    On her last  venous duplex ultrasound 07/02/2019 which showed absent left greater saphenous vein.  She did have evidence of reflux in the deep system.  She was placed in compression and asked to elevate her legs when at rest.  She was set up for sclerotherapy.     Other medical problems include CKD 3, claudication, hypertension, shortness of breath with exertion all of which are currently stable.  Past Medical History:  Diagnosis Date  . Acute renal failure superimposed on stage 3 chronic kidney disease ([place]) 10/27/2014  . Back pain, chronic   . Breast cancer ([place]) 09/09/10  . Cancer of colon ([place]) 05/24/2011  . Centrilobular emphysema ([place]) 07/15/2018  . CKD (chronic kidney disease), stage III ([place])   . Claudication, intermittent ([place]) 07/15/2018  . Depression   . GIB (gastrointestinal bleeding)   . H/O tobacco use, presenting hazards to health Quit Dec 2019 05/03/2011   50 years, up to 3PPD quit last year.   Marland Kitchen Hx of varicose veins of lower extremity 07/15/2018  . Hypertension   . SDH (subdural hematoma) ([place])   . SOB (shortness of breath) 07/15/2018    Past Surgical History:  Procedure Laterality Date  . ABDOMINAL AORTOGRAM W/LOWER EXTREMITY Left 03/18/2019   Procedure: ABDOMINAL AORTOGRAM W/LOWER EXTREMITY;  Surgeon: Adrian Prows, MD;  Location: [facility];  Service: Cardiovascular;  Laterality: Left;  . BACK SURGERY    . BREAST LUMPECTOMY  10/10/2010   Rt Breast  . COLON SURGERY  04/2011  . COLONOSCOPY  05/03/2011   Procedure: COLONOSCOPY;  Surgeon: Jeryl Columbia, MD;  Location: [facility] ENDOSCOPY;  Service: Endoscopy;  Laterality: N/A;  . JOINT REPLACEMENT     R&L total shoulder replacements  . LUMBAR DISC SURGERY    . PARTIAL HYSTERECTOMY    . PERIPHERAL VASCULAR INTERVENTION  03/18/2019   Procedure: PERIPHERAL VASCULAR INTERVENTION;  Surgeon: Adrian Prows, MD;  Location: [facility];  Service: Cardiovascular;;  attempted left SFA  . SHOULDER SURGERY    . TUMOR REMOVAL  Tumor removed R breast    Social History   Socioeconomic History  . Marital status: Divorced    Spouse name: Not on file  . Number of children: 2  . Years of education: Not on file  . Highest education level: Not on file  Occupational History  . Not on file  Tobacco Use  . Smoking status: Former Smoker    Packs/day: 0.25    Years: 30.00    Pack years: 7.50    Types: Cigarettes    Quit date: 10/31/2019    Years since quitting: 0.3  . Smokeless tobacco: Former Systems developer    Quit date: 04/22/2010  Vaping Use  . Vaping Use: Never  used  Substance and Sexual Activity  . Alcohol use: Yes    Comment: rarely   . Drug use: No  . Sexual activity: Not Currently  Other Topics Concern  . Not on file  Social History Narrative   Lives alone in a one story home.  Has 2 children.     On disability for low back pain since the age of 61.     Education: high school.   Social Determinants of Health   Financial Resource Strain:   . Difficulty of Paying Living Expenses: Not on file  Food Insecurity:   . Worried About Charity fundraiser in the Last Year: Not on file  . Ran Out of Food in the Last Year: Not on file  Transportation Needs:   . Lack of Transportation (Medical): Not on file  . Lack of Transportation (Non-Medical): Not on  file  Physical Activity:   . Days of Exercise per Week: Not on file  . Minutes of Exercise per Session: Not on file  Stress:   . Feeling of Stress : Not on file  Social Connections:   . Frequency of Communication with Friends and Family: Not on file  . Frequency of Social Gatherings with Friends and Family: Not on file  . Attends Religious Services: Not on file  . Active Member of Clubs or Organizations: Not on file  . Attends Archivist Meetings: Not on file  . Marital Status: Not on file  Intimate Partner Violence:   . Fear of Current or Ex-Partner: Not on file  . Emotionally Abused: Not on file  . Physically Abused: Not on file  . Sexually Abused: Not on file    Family History  Problem Relation Age of Onset  . Diabetes Mother   . Hypertension Mother   . Cancer Mother        leukemia  . Sudden death Mother   . Heart attack Mother   . Anesthesia problems Neg Hx   . Hypotension Neg Hx   . Malignant hyperthermia Neg Hx   . Pseudochol deficiency Neg Hx   . Breast cancer Neg Hx     Current Outpatient Medications on File Prior to Visit  Medication Sig Dispense Refill  . ALPRAZolam (XANAX) 1 MG tablet as needed.     Marland Kitchen amLODipine (NORVASC) 5 MG tablet TAKE 1 TABLET BY MOUTH EVERY DAY 90 tablet 1  . aspirin EC 81 MG tablet Take 81 mg by mouth daily. Swallow whole.    Marland Kitchen atorvastatin (LIPITOR) 10 MG tablet TAKE 1 TABLET BY MOUTH EVERY DAY 90 tablet 3  . buPROPion (WELLBUTRIN XL) 150 MG 24 hr tablet Take 150 mg by mouth daily.    . furosemide (LASIX) 20 MG tablet Take 1 tablet (20 mg total) by mouth 2 (two) times daily. 60 tablet 3  . gabapentin (NEURONTIN) 100 MG capsule Take 100 mg by mouth 3 (three) times daily.    . hydrALAZINE (APRESOLINE) 25 MG tablet TAKE 1 TABLET BY MOUTH THREE TIMES A DAY 270 tablet 3  . losartan-hydrochlorothiazide (HYZAAR) 100-12.5 MG tablet Take 1 tablet by mouth daily. 90 tablet 3  . mirtazapine (REMERON) 30 MG tablet 30 mg daily.     Marland Kitchen  oxyCODONE (OXYCONTIN) 60 MG 12 hr tablet Take 1 tablet by mouth every 8 (eight) hours.    . potassium chloride (KLOR-CON) 10 MEQ tablet TAKE 1 TABLET (10 MEQ TOTAL) BY MOUTH DAILY. TAKE WITH LASIX 90 tablet 1  .  potassium chloride (MICRO-K) 10 MEQ CR capsule Take 10 mEq by mouth 2 (two) times daily.    Marland Kitchen rOPINIRole (REQUIP) 1 MG tablet Take 1 mg by mouth at bedtime.     No current facility-administered medications on file prior to visit.    Allergies as of 03/03/2020  . (No Known Allergies)     ROS:   General:  No weight loss, Fever, chills  HEENT: No recent headaches, no nasal bleeding, no visual changes, no sore throat  Neurologic: No dizziness, blackouts, seizures. No recent symptoms of stroke or mini- stroke. No recent episodes of slurred speech, or temporary blindness.  Cardiac: No recent episodes of chest pain/pressure, no shortness of breath at rest.  No shortness of breath with exertion.  Denies history of atrial fibrillation or irregular heartbeat  Vascular: No history of rest pain in feet.  No history of claudication.  No history of non-healing ulcer, No history of DVT   Pulmonary: No home oxygen, no productive cough, no hemoptysis,  No asthma or wheezing  Musculoskeletal:  [ ]  Arthritis, [ ]  Low back pain,  [ ]  Joint pain  Hematologic:No history of hypercoagulable state.  No history of easy bleeding.  No history of anemia  Gastrointestinal: No hematochezia or melena,  No gastroesophageal reflux, no trouble swallowing  Urinary: [ ]  chronic Kidney disease, [ ]  on HD - [ ]  MWF or [ ]  TTHS, [ ]  Burning with urination, [ ]  Frequent urination, [ ]  Difficulty urinating;   Skin: No rashes  Psychological: No history of anxiety,  No history of depression  Physical Examination  Vitals:   03/03/20 1519  BP: (!) 161/79  Pulse: 78  Resp: 20  Temp: (!) 96.4 F (35.8 C)  TempSrc: Temporal  SpO2: 93%  Weight: 129 lb 12.8 oz (58.9 kg)  Height: 5' (1.524 m)    Body mass  index is 25.35 kg/m.  General:  Alert and oriented, no acute distress HEENT: Normal Neck: No bruit or JVD Pulmonary: Clear to auscultation bilaterally Cardiac: Regular Rate and Rhythm without murmur Abdomen: Soft, non-tender, non-distended, no mass, no scars Skin: No rash      Extremity Pulses:  2+ radial, brachial, femoral, non palpable dorsalis pedis, posterior tibial pulses bilaterally Musculoskeletal: No deformity or edema  Neurologic: Upper and lower extremity motor 5/5 and symmetric  DATA: RIGHT  CompressibilityPhasicitySpontaneityPropertiesThrombus  Aging  +---------+---------------+---------+-----------+----------+--------------+   CFV   Full      Yes   Yes                    +---------+---------------+---------+-----------+----------+--------------+   SFJ   Full           Yes                    +---------+---------------+---------+-----------+----------+--------------+   FV Prox Full      Yes   Yes                    +---------+---------------+---------+-----------+----------+--------------+   FV Mid  Full      Yes   Yes                    +---------+---------------+---------+-----------+----------+--------------+   FV DistalFull      Yes   Yes                    +---------+---------------+---------+-----------+----------+--------------+   POP   Full      Yes   Yes                    +---------+---------------+---------+-----------+----------+--------------+  PTV   Full           Yes                    +---------+---------------+---------+-----------+----------+--------------+   PERO   Full           Yes                      +---------+---------------+---------+-----------+----------+--------------+   GSV   Full      Yes   Yes                    +---------+---------------+---------+-----------+----------+--------------+         Summary:  RIGHT:  - There is no evidence of deep vein thrombosis in the lower extremity.  - There is no evidence of superficial venous thrombosis.     Assessment/Plan: Negative for DVT Repeated anterior right shin bleeding varicosity. She has had sclerotherapy on varicose veins in the past due to bleeding episodes.  I will refer her back to Izora Gala our Sclerotherapy RN for consideration of further treatment.    She will protect her legs with compression and elevation.  Direct pressure if bleeding occurs. And Izora Gala will review the pictures taken today and call her.      Roxy Horseman PA-C [facility] [place] Office: [phone]  MD in clinic [place]

## 2020-03-04 ENCOUNTER — Telehealth: Payer: Self-pay

## 2020-03-04 NOTE — Telephone Encounter (Signed)
Pt interested in additional sclerotherapy for 2 new raised areas of veins that she noticed after getting out of the shower. These areas did not bleed but she felt they could in future. She stated she can not pay out of pocket for sclerotherapy and will call us back if anything changes. She is aware to keep area covered and protected and to pat dry after her shower.

## 2020-03-10 ENCOUNTER — Encounter: Payer: Self-pay | Admitting: Internal Medicine

## 2020-03-10 ENCOUNTER — Ambulatory Visit (INDEPENDENT_AMBULATORY_CARE_PROVIDER_SITE_OTHER): Payer: Medicare Other | Admitting: Internal Medicine

## 2020-03-10 ENCOUNTER — Other Ambulatory Visit: Payer: Self-pay

## 2020-03-10 VITALS — BP 122/68 | HR 77 | Temp 98.3°F | Ht 60.0 in | Wt 130.0 lb

## 2020-03-10 DIAGNOSIS — J432 Centrilobular emphysema: Secondary | ICD-10-CM

## 2020-03-10 DIAGNOSIS — M4003 Postural kyphosis, cervicothoracic region: Secondary | ICD-10-CM

## 2020-03-10 DIAGNOSIS — R0602 Shortness of breath: Secondary | ICD-10-CM | POA: Diagnosis not present

## 2020-03-10 NOTE — Patient Instructions (Signed)
The patient should have follow up scheduled with myself in  months.   Prior to next visit patient should have: Full set of PFTs  

## 2020-03-10 NOTE — Addendum Note (Signed)
Addended by: Merrilee Seashore on: 03/10/2020 03:55 PM   Modules accepted: Orders

## 2020-03-10 NOTE — Progress Notes (Signed)
[name]    [id number]    01/06/1946  Primary Care Physician:Scifres, Durel Salts  Referring Physician: Maude Leriche, PA-C [place],  [place] [address] Reason for Consultation: shortness of breath Date of Consultation: 03/10/2020  Chief complaint:   Chief Complaint  Patient presents with  . Consult    shortness of breath with activity for past few months     HPI: [name] is a 74 y.o. woman with history of tobacco use (some day current smoker) who notes dyspnea with exertion progressing over the past several months. She feels that a year ago she could do everything she wanted to do from a dyspnea standpoint.   Has morning stiffness secondary to back surgeries. She is hunched over from this and has poor posture. Her back surgeries were 20 years ago but posture has declined in the last year. Hasn't seen anyway in several years. She gets dyspnea with ADLs such as bathing and dressing. Takes her ten minutes to put on compression socks. 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   No cough, no chest tightness or wheezing. Hasn't had bronchitis in several years, and never been hospitalized for it. Does not take any inhaler.    Social history:  Occupation: has been disabled since age 25 due to a fall. Working in a warehouse at the time.  Smoking history: some day current smoker 0.5 ppd x 60 years = 30 pack years. Has quit twice for about a year.    Social History   Occupational History  . Not on file  Tobacco Use  . Smoking status: Former Smoker    Packs/day: 0.25    Years: 30.00    Pack years: 7.50    Types: Cigarettes    Quit date: 10/31/2019    Years since quitting: 0.3  . Smokeless tobacco: Never Used  . Tobacco comment: had 6 cigarettes recently 03-10-20  Vaping Use  . Vaping Use: Never used  Substance and Sexual Activity  . Alcohol use: Yes    Comment: rarely   . Drug use: No  . Sexual activity: Not Currently      Relevant family history:  Family History  Problem Relation Age of Onset  . Diabetes Mother   . Hypertension Mother   . Cancer Mother        leukemia  . Sudden death Mother   . Heart attack Mother   . Anesthesia problems Neg Hx   . Hypotension Neg Hx   . Malignant hyperthermia Neg Hx   . Pseudochol deficiency Neg Hx   . Breast cancer Neg Hx   . Lung disease Neg Hx     Past Medical History:  Diagnosis Date  . Acute renal failure superimposed on stage 3 chronic kidney disease ([place]) 10/27/2014  . Back pain, chronic   . Breast cancer ([place]) 09/09/10  . Cancer of colon ([place]) 05/24/2011  . Centrilobular emphysema ([place]) 07/15/2018  . CKD (chronic kidney disease), stage III ([place])   . Claudication, intermittent ([place]) 07/15/2018  . Depression   . GIB (gastrointestinal bleeding)   . H/O tobacco use, presenting hazards to health Quit Dec 2019 05/03/2011   50 years, up to 3PPD quit last year.   Marland Kitchen Hx of varicose veins of lower extremity 07/15/2018  . Hypertension   . SDH (subdural hematoma) ([place])   . SOB (shortness of breath) 07/15/2018    Past Surgical History:  Procedure Laterality Date  . ABDOMINAL AORTOGRAM W/LOWER EXTREMITY Left 03/18/2019   Procedure: ABDOMINAL AORTOGRAM W/LOWER EXTREMITY;  Surgeon: Adrian Prows, MD;  Location: [facility];  Service: Cardiovascular;  Laterality: Left;  . BACK SURGERY    . BREAST LUMPECTOMY  10/10/2010   Rt Breast  . COLON SURGERY  04/2011  . COLONOSCOPY  05/03/2011   Procedure: COLONOSCOPY;  Surgeon: Jeryl Columbia, MD;  Location[facility] ENDOSCOPY;  Service: Endoscopy;  Laterality: N/A;  . JOINT REPLACEMENT     R&L total shoulder replacements  . LUMBAR DISC SURGERY    . PARTIAL HYSTERECTOMY    . PERIPHERAL VASCULAR INTERVENTION  03/18/2019   Procedure: PERIPHERAL VASCULAR INTERVENTION;  Surgeon: Adrian Prows, MD;  Location: [facility];  Service: Cardiovascular;;  attempted left SFA  . SHOULDER SURGERY    . TUMOR REMOVAL  Tumor removed R  breast     Physical Exam: Blood pressure 122/68, pulse 77, temperature 98.3 F (36.8 C), temperature source Other (Comment), height 5' (1.524 m), weight 130 lb (59 kg), SpO2 95 %. Gen:      No acute distress, severe kyphosis with poor posture ENT:  no nasal polyps, mucus membranes moist Lungs:    No increased respiratory effort, symmetric chest wall excursion, breath sounds diminished CV:         Regular rate and rhythm; no murmurs, rubs, or gallops.  No pedal edema Abd:      + bowel sounds; soft, non-tender; no distension MSK: Heber[place]'s nodes consistent with osteoarthritis Skin:      Warm and dry; no rashes* Neuro: normal speech, no focal facial asymmetry Psych: alert and oriented x3, anxious   Data Reviewed/Medical Decision Making:  Independent interpretation of tests: Imaging: . Review of patient's CT Angio Jan 2021 images revealed emphysema, no PE. The patient's images have been independently reviewed by me.    Echo: July 0865 Normal LV systolic function with EF 57%. Left ventricle cavity is normal  in size. Normal global wall motion. Doppler evidence of grade I (impaired)  diastolic dysfunction, normal LAP. Calculated EF 57%.  Left atrial cavity is mildly dilated.  Mild (Grade I) mitral regurgitation.  Mild to moderate tricuspid regurgitation. Estimated pulmonary artery  systolic pressure is 78-46 mmHg.  IVC is dilated with blunted respiratory response. Estimated RA pressure  10-15 mmHg.  PFTs: None  Labs:  Lab Results  Component Value Date   WBC 3.6 (L) 11/15/2019   HGB 8.1 (L) 11/15/2019   HCT 27.4 (L) 11/15/2019   MCV 77.8 (L) 11/15/2019   PLT 80 (L) 11/15/2019   Lab Results  Component Value Date   NA 141 11/15/2019   K 4.2 11/15/2019   CL 105 11/15/2019   CO2 28 11/15/2019     Immunization status:  There is no immunization history for the selected administration types on file for this patient.  . I reviewed prior external note(s) from hospital  stay, ED visit, urgent care, cardiology . I reviewed the result(s) of the labs and imaging as noted above.  . I have ordered PFT  Assessment:  Shortness of breath Emphysema Severe kyphosis  Plan/Recommendations: [name] is a 74 y.o. woman with history of tobacco use disorder who presents with worsening shortness of breath. Suspect this is mixed from her chest wall restriction from spinal kyphosis as well as her underlying COPD.  I suggested she try an albuterol rescue inhaler to use as needed to see if this alleviates any of  her symptoms.  At this point she is not wanting to take this given that she tried it once before and it did not like the way it made her feel and she does not know if it improved her breathing.  We discussed disease management and progression at length today for COPD.  I will see her back after PFTs for further conversation about treatment.  Return to Care: Return in about 4 weeks (around 04/07/2020).  Lenice Llamas, MD [facility] [place]  CC: Scifres, Earlie Server, [place]

## 2020-03-12 ENCOUNTER — Other Ambulatory Visit: Payer: Self-pay

## 2020-03-12 ENCOUNTER — Ambulatory Visit: Payer: Medicare Other

## 2020-03-12 ENCOUNTER — Ambulatory Visit
Admission: RE | Admit: 2020-03-12 | Discharge: 2020-03-12 | Disposition: A | Discharge status: Home / Self Care | Payer: Medicare Other | Source: Ambulatory Visit | Attending: Physician Assistant | Admitting: Physician Assistant

## 2020-03-12 DIAGNOSIS — N644 Mastodynia: Secondary | ICD-10-CM

## 2020-03-19 ENCOUNTER — Telehealth: Payer: Self-pay

## 2020-03-19 NOTE — Telephone Encounter (Signed)
Returned pt's call regarding her R anterior lower leg bleed. She stated this occurred on 03/17/20 and she was able to eventually get the bleeding under control at home with the help of her niece. This happened when she was walking to the bathroom and had an itch on her leg. I have discussed with her the importance of elevation and compression. She is aware and is keeping area covered and protected. I will submit this to her insurance to see if they will approve sclerotherapy for this area. Pt verbalized understanding.

## 2020-03-26 ENCOUNTER — Ambulatory Visit: Payer: Medicare Other

## 2020-03-30 ENCOUNTER — Other Ambulatory Visit: Payer: Self-pay

## 2020-03-30 ENCOUNTER — Ambulatory Visit (INDEPENDENT_AMBULATORY_CARE_PROVIDER_SITE_OTHER): Payer: Medicare Other

## 2020-03-30 DIAGNOSIS — I83899 Varicose veins of unspecified lower extremities with other complications: Secondary | ICD-10-CM | POA: Diagnosis not present

## 2020-03-30 NOTE — Progress Notes (Signed)
Treated pt's R lower leg superficial spider veins with Asclera 1% administered with a 27g butterfly.  Patient received a total of 2 mL. Pt tolerated well. Area that had bled in the past was treated and needed to hold pressure for a few minutes to get it to stop bleeding. We discussed importance of wearing compression and applying firm pressure if area bleeds again in future. She will call us if this occurs. Pt given handout and verbal post procedure instructions.   Photos: Yes.    Compression stockings applied: Yes.

## 2020-04-02 ENCOUNTER — Ambulatory Visit: Payer: Medicare Other

## 2020-04-08 ENCOUNTER — Ambulatory Visit (INDEPENDENT_AMBULATORY_CARE_PROVIDER_SITE_OTHER): Payer: Medicare Other | Admitting: Internal Medicine

## 2020-04-08 ENCOUNTER — Other Ambulatory Visit: Payer: Self-pay

## 2020-04-08 ENCOUNTER — Encounter: Payer: Self-pay | Admitting: Internal Medicine

## 2020-04-08 VITALS — BP 130/70 | HR 53 | Temp 98.8°F | Ht 60.0 in | Wt 132.0 lb

## 2020-04-08 DIAGNOSIS — R0602 Shortness of breath: Secondary | ICD-10-CM | POA: Diagnosis not present

## 2020-04-08 DIAGNOSIS — J432 Centrilobular emphysema: Secondary | ICD-10-CM | POA: Diagnosis not present

## 2020-04-08 LAB — PULMONARY FUNCTION TEST
DL/VA % pred: 65 %
DL/VA: 2.8 ml/min/mmHg/L
DLCO cor % pred: 55 %
DLCO cor: 9.26 ml/min/mmHg
DLCO unc % pred: 55 %
DLCO unc: 9.26 ml/min/mmHg
FEF 25-75 Post: 0.67 L/sec
FEF 25-75 Pre: 0.6 L/sec
FEF2575-%Change-Post: 11 %
FEF2575-%Pred-Post: 43 %
FEF2575-%Pred-Pre: 39 %
FEV1-%Change-Post: 0 %
FEV1-%Pred-Post: 53 %
FEV1-%Pred-Pre: 53 %
FEV1-Post: 0.96 L
FEV1-Pre: 0.96 L
FEV1FVC-%Change-Post: -19 %
FEV1FVC-%Pred-Pre: 93 %
FEV6-%Change-Post: 23 %
FEV6-%Pred-Post: 74 %
FEV6-%Pred-Pre: 59 %
FEV6-Post: 1.7 L
FEV6-Pre: 1.37 L
FEV6FVC-%Pred-Post: 105 %
FEV6FVC-%Pred-Pre: 105 %
FVC-%Change-Post: 23 %
FVC-%Pred-Post: 70 %
FVC-%Pred-Pre: 56 %
FVC-Post: 1.7 L
FVC-Pre: 1.37 L
Post FEV1/FVC ratio: 57 %
Post FEV6/FVC ratio: 100 %
Pre FEV1/FVC ratio: 70 %
Pre FEV6/FVC Ratio: 100 %
RV % pred: 121 %
RV: 2.48 L
TLC % pred: 100 %
TLC: 4.47 L

## 2020-04-08 NOTE — Progress Notes (Signed)
Holly S Jupin    [id number]    [birth year]  Primary Care Physician:Scifres, Durel Salts Date of Appointment: 04/08/2020 Established Patient Visit  Chief complaint:   Chief Complaint  Patient presents with   Follow-up    follow up after pft     HPI: [name] is a 74 y.o. woman with emphysema who presents with shortness of breath.  Interval Updates: Here for follow up after PFTs. Declined albuterol inhaler at last visit because she doesn't like the way it makes her feel and doesn't think it helps her breathing. Still short of breath with exertion. Trying to stay as active as possible.   I have reviewed the patient's family social and past medical history and updated as appropriate.   Past Medical History:  Diagnosis Date   Acute renal failure superimposed on stage 3 chronic kidney disease ([place]) 10/27/2014   Back pain, chronic    Breast cancer ([place]) 09/09/10   Cancer of colon ([place]) 05/24/2011   Centrilobular emphysema ([place]) 07/15/2018   CKD (chronic kidney disease), stage III (HCC)    Claudication, intermittent ([place]) 07/15/2018   Depression    GIB (gastrointestinal bleeding)    H/O tobacco use, presenting hazards to health Quit Dec 2019 05/03/2011   50 years, up to 3PPD quit last year.    Hx of varicose veins of lower extremity 07/15/2018   Hypertension    SDH (subdural hematoma) (HCC)    SOB (shortness of breath) 07/15/2018    Past Surgical History:  Procedure Laterality Date   ABDOMINAL AORTOGRAM W/LOWER EXTREMITY Left 03/18/2019   Procedure: ABDOMINAL AORTOGRAM W/LOWER EXTREMITY;  Surgeon: Adrian Prows, MD;  Location: [place] [facility];  Service: Cardiovascular;  Laterality: Left;   BACK SURGERY     BREAST LUMPECTOMY Right 10/10/2010   COLON SURGERY  04/2011   COLONOSCOPY  05/03/2011   Procedure: COLONOSCOPY;  Surgeon: Jeryl Columbia, MD;  Location: [facility]COPY;  Service: Endoscopy;  Laterality: N/A;   JOINT REPLACEMENT     R&L  total shoulder replacements   LUMBAR DISC SURGERY     PARTIAL HYSTERECTOMY     PERIPHERAL VASCULAR INTERVENTION  03/18/2019   Procedure: PERIPHERAL VASCULAR INTERVENTION;  Surgeon: Adrian Prows, MD;  Location: [place] [facility];  Service: Cardiovascular;;  attempted left SFA   SHOULDER SURGERY     TUMOR REMOVAL  Tumor removed R breast    Family History  Problem Relation Age of Onset   Diabetes Mother    Hypertension Mother    Cancer Mother        leukemia   Sudden death Mother    Heart attack Mother    Anesthesia problems Neg Hx    Hypotension Neg Hx    Malignant hyperthermia Neg Hx    Pseudochol deficiency Neg Hx    Breast cancer Neg Hx    Lung disease Neg Hx     Social History   Occupational History   Not on file  Tobacco Use   Smoking status: Former Smoker    Packs/day: 0.25    Years: 30.00    Pack years: 7.50    Types: Cigarettes    Quit date: 10/31/2019    Years since quitting: 0.4   Smokeless tobacco: Never Used   Tobacco comment: had 6 cigarettes recently 03-10-20  Vaping Use   Vaping Use: Never used  Substance and Sexual Activity   Alcohol use: Yes    Comment: rarely  Drug use: No   Sexual activity: Not Currently     Physical Exam: Blood pressure 130/70, pulse (!) 53, temperature 98.8 F (37.1 C), temperature source Temporal, height 5' (1.524 m), weight 132 lb (59.9 kg), SpO2 94 %.  Gen:      No acute distress Lungs:    Kyphosis, diminished, No increased respiratory effort, symmetric chest wall excursion, clear to auscultation bilaterally, no wheezes or crackles CV:         Regular rate and rhythm; no murmurs, rubs, or gallops.  No pedal edema Ext:   [place]'s nodes consistent with osteoarthritis  Data Reviewed: Imaging: I have personally reviewed the CT angio from Jan 2021 which shows centrilobular emphysema  PFTs:  PFT Results Latest Ref Rng & Units 04/08/2020  FVC-Pre L 1.37  FVC-Predicted Pre % 56  FVC-Post L 1.70   FVC-Predicted Post % 70  Pre FEV1/FVC % % 70  Post FEV1/FCV % % 57  FEV1-Pre L 0.96  FEV1-Predicted Pre % 53  FEV1-Post L 0.96  DLCO uncorrected ml/min/mmHg 9.26  DLCO UNC% % 55  DLCO corrected ml/min/mmHg 9.26  DLCO COR %Predicted % 55  DLVA Predicted % 65  TLC L 4.47  TLC % Predicted % 100  RV % Predicted % 121   I have personally reviewed the patient's PFTs and they show moderately severe airflow limitation with a positive bronchodilator response. There is reduced diffusion capacity as well.   Labs:  Immunization status: Immunization History  Administered Date(s) Administered   PFIZER SARS-COV-2 Vaccination 08/15/2019, 08/26/2019    Assessment:  Moderately Severe Emphysema FEV 54% of predicted Severe kyphosis  Plan/Recommendations: [name] is a 74 y.o. woman with history of tobacco use disorder who presents with worsening shortness of breath. PFTs consistent with moderately severe air flow limitation from her emphysema.  We discussed disease management and progression at length today for COPD. She would like to continue holding on any inhaler therapy due to side effects.  I avised regular vaccination including covid booster, pneumonia vaccine and flu vaccine. Also recommend she stay as active as possible. Will order desaturation study for hypoxemia today.   Addendum: Patient did desaturate on room air and required 3 L of oxygen.  Will prescribe oxygen today.   Return to Care: Return in about 6 months (around 10/06/2020).   Lenice Llamas, MD Pulmonary and [place]

## 2020-04-08 NOTE — Progress Notes (Signed)
Full PFT performed today. °

## 2020-04-08 NOTE — Patient Instructions (Signed)
The patient should have follow up scheduled with myself in 6 months.   

## 2020-04-09 ENCOUNTER — Telehealth: Payer: Self-pay | Admitting: Internal Medicine

## 2020-04-09 NOTE — Telephone Encounter (Signed)
Patient returned call, she was asking what oxygen level she needed her oxygen set on.  Advised she is on 3L.  She has an appointment in [place] for a best fit portable oxygen.  She said she was called while she was in [place] looking for a pulse oximeter by [employer] regarding her oxygen set up.  She stated she did not feel like she needs an oxygen concentrator in her home.  I advised her to call [employer] back and schedule her oxygen to be delivered as her level dropped to 77% in the office.  I provided her with the # for [employer] and she stated she would call them back.  Nothing further needed.

## 2020-04-09 NOTE — Telephone Encounter (Signed)
LMTCB for pt 

## 2020-04-09 NOTE — Telephone Encounter (Signed)
Called and spoke with patient, she was telling me that [facility] went to [facility] on her way home last night and the pulse ox was $50 and she was told it would only be $15.  I advised her that they are usually more expensive [facility] and [facility] and to [facility].  I looked online and found one for $14.95.  Advised to keep a record of her oxygen levels so she will know when it drops to 88% and below and that is when she needs to be wearing the oxygen.  She verbalized understanding.  I let her know that would call [employer] and as that they give her a call regarding the delivery of her oxygen.    I called [employer] and was told that they received the orders for the oxygen and all they have to do is call the patient, go over the covid questions and speak with the patient/family member to set up a time to deliver it.  Should not be any delay unless they are unable to reach the patient.  Nothing further needed.

## 2020-04-13 ENCOUNTER — Telehealth: Payer: Self-pay | Admitting: Internal Medicine

## 2020-04-13 NOTE — Telephone Encounter (Signed)
Called and spoke with patient  She has not received oxygen from [employer] Order placed 04/08/20 Confirmation on on 04/09/20 from [employer] to [facility].  Called Melissa with [employer] to look into the situation and, she states patient refused delivery. Lenna Sciara is going to look into it further.   Patient will be out of house from 12:30p-3pm on 04/13/20 only please call after this time frame with information

## 2020-04-13 NOTE — Telephone Encounter (Signed)
Patient is leaving at 12:15 pm. Patient phone number is [phone].

## 2020-04-22 NOTE — Telephone Encounter (Signed)
Called and spoke to pt. Pt states she received her O2 on 04/10/20. Pt denies any issues at this time. Will sign off.

## 2020-05-02 ENCOUNTER — Other Ambulatory Visit: Payer: Self-pay | Admitting: Cardiology

## 2020-05-12 ENCOUNTER — Telehealth: Payer: Self-pay

## 2020-05-12 NOTE — Telephone Encounter (Signed)
Once a day is fine

## 2020-05-12 NOTE — Telephone Encounter (Signed)
Patient called regarding her lasix she takes it one daily but her latest script is for BID pt is confuse she has been on it forever and only takes it once please advise

## 2020-05-13 NOTE — Telephone Encounter (Signed)
No answer left a vm will try again later

## 2020-05-13 NOTE — Telephone Encounter (Signed)
Pt called back, made aware//ah

## 2020-05-28 DIAGNOSIS — M13841 Other specified arthritis, right hand: Secondary | ICD-10-CM | POA: Diagnosis not present

## 2020-06-07 ENCOUNTER — Ambulatory Visit: Payer: Medicare Other | Admitting: Cardiology

## 2020-06-11 ENCOUNTER — Other Ambulatory Visit: Payer: Self-pay | Admitting: Cardiology

## 2020-06-11 ENCOUNTER — Ambulatory Visit: Payer: Medicare Other | Admitting: Cardiology

## 2020-06-11 DIAGNOSIS — I10 Essential (primary) hypertension: Secondary | ICD-10-CM

## 2020-06-14 DIAGNOSIS — J432 Centrilobular emphysema: Secondary | ICD-10-CM | POA: Diagnosis not present

## 2020-06-23 ENCOUNTER — Ambulatory Visit: Payer: Medicare Other | Admitting: Cardiology

## 2020-06-23 NOTE — Progress Notes (Deleted)
Primary Physician/Referring:  Maude Leriche, PA-C  Patient ID: [name], female    DOB: Apr 04, 1946, 75 y.o.   MRN: [id number]  Chief complaints: hospital follow up  HPI: [name]  is a 75 y.o. female  with peripheral artery disease has known long left SFA CTO with failed attempt in September 2020 for revascularization, also has right calcific iliac artery stenosis which was felt to be high risk due to extension into right common femoral artery for dissection. She has right hip claudication and also left leg claudication that is chronic but has remained stable.  Past medical history significant for tobacco use disorder, which she quit on 10/21/2019, hypertension, COPD,Stage III chronic kidney disease, history of breast cancer, bilateral venous insufficiency and has had ablation and persistent superficial varicose veins with complications and recurrent varicose vein bleeding, last occurrence 11/14/2019 needing hospitalization and blood transfusion.    *** She is presently feeling better, still has pain in her left foot at the site of variceal bleed.  Hip pain has improved since being on Neurontin.   Past Medical History:  Diagnosis Date  . Acute renal failure superimposed on stage 3 chronic kidney disease ([place]) 10/27/2014  . Back pain, chronic   . Breast cancer ([place]) 09/09/10  . Cancer of colon ([place]) 05/24/2011  . Centrilobular emphysema ([place]) 07/15/2018  . CKD (chronic kidney disease), stage III ([place])   . Claudication, intermittent ([place]) 07/15/2018  . Depression   . GIB (gastrointestinal bleeding)   . H/O tobacco use, presenting hazards to health Quit Dec 2019 05/03/2011   50 years, up to 3PPD quit last year.   Marland Kitchen Hx of varicose veins of lower extremity 07/15/2018  . Hypertension   . SDH (subdural hematoma) ([place])   . SOB (shortness of breath) 07/15/2018    Past Surgical History:  Procedure Laterality Date  . ABDOMINAL AORTOGRAM W/LOWER EXTREMITY Left 03/18/2019   Procedure:  ABDOMINAL AORTOGRAM W/LOWER EXTREMITY;  Surgeon: Adrian Prows, MD;  Location: [place] [facility];  Service: Cardiovascular;  Laterality: Left;  . BACK SURGERY    . BREAST LUMPECTOMY Right 10/10/2010  . COLON SURGERY  04/2011  . COLONOSCOPY  05/03/2011   Procedure: COLONOSCOPY;  Surgeon: Jeryl Columbia, MD;  Location: [facility] ENDOSCOPY;  Service: Endoscopy;  Laterality: N/A;  . JOINT REPLACEMENT     R&L total shoulder replacements  . LUMBAR DISC SURGERY    . PARTIAL HYSTERECTOMY    . PERIPHERAL VASCULAR INTERVENTION  03/18/2019   Procedure: PERIPHERAL VASCULAR INTERVENTION;  Surgeon: Adrian Prows, MD;  Location: [place] [facility];  Service: Cardiovascular;;  attempted left SFA  . SHOULDER SURGERY    . TUMOR REMOVAL  Tumor removed R breast   Social History   Tobacco Use  . Smoking status: Former Smoker    Packs/day: 0.25    Years: 30.00    Pack years: 7.50    Types: Cigarettes    Quit date: 10/31/2019    Years since quitting: 0.6  . Smokeless tobacco: Never Used  . Tobacco comment: had 6 cigarettes recently 03-10-20  Substance Use Topics  . Alcohol use: Yes    Comment: rarely   Marital Status: Divorced   Review of Systems  Constitutional: Positive for malaise/fatigue.  Cardiovascular: Positive for claudication, dyspnea on exertion and leg swelling. Negative for chest pain, orthopnea, palpitations and syncope.  Musculoskeletal: Positive for arthritis and back pain.  All other systems reviewed and are negative.     Objective  There  were no vitals taken for this visit. There is no height or weight on file to calculate BMI.    Physical Exam Vitals reviewed.  Constitutional:      Appearance: She is well-developed.  Cardiovascular:     Rate and Rhythm: Normal rate and regular rhythm.     Pulses: Intact distal pulses.          Femoral pulses are 2+ on the right side with bruit.      Popliteal pulses are 1+ on the right side and 1+ on the left side.       Dorsalis pedis pulses are 1+ on  the right side and 0 on the left side.       Posterior tibial pulses are 1+ on the right side and 0 on the left side.     Heart sounds: Normal heart sounds.     Comments: Mild Erythema bilateral R>L. Left medial malleolus shows a small scar like lesion that does not appear ischemia. No discharge.  No leg edema Pulmonary:     Effort: Pulmonary effort is normal. No accessory muscle usage or respiratory distress.     Breath sounds: Normal breath sounds.  Abdominal:     General: Bowel sounds are normal.     Palpations: Abdomen is soft.  Skin:    Findings: No erythema.    Radiology: No results found.  Laboratory examination:    CMP Latest Ref Rng & Units 11/15/2019 11/14/2019 07/14/2019  Glucose 70 - 99 mg/dL 89 204(H) 88  BUN 8 - 23 mg/dL 53(H) 77(H) 43(H)  Creatinine 0.44 - 1.00 mg/dL 1.49(H) 2.74(H) 1.39(H)  Sodium 135 - 145 mmol/L 141 137 140  Potassium 3.5 - 5.1 mmol/L 4.2 4.4 4.9  Chloride 98 - 111 mmol/L 105 101 100  CO2 22 - 32 mmol/L 28 25 26   Calcium 8.9 - 10.3 mg/dL 9.3 9.6 10.6(H)  Total Protein 6.5 - 8.1 g/dL 4.7(L) 5.6(L) -  Total Bilirubin 0.3 - 1.2 mg/dL 0.4 0.4 -  Alkaline Phos 38 - 126 U/L 61 92 -  AST 15 - 41 U/L 17 20 -  ALT 0 - 44 U/L 11 14 -   CBC Latest Ref Rng & Units 11/15/2019 11/14/2019 11/14/2019  WBC 4.0 - 10.5 K/uL 3.6(L) 4.2 6.4  Hemoglobin 12.0 - 15.0 g/dL 8.1(L) 9.2(L) 7.1(L)  Hematocrit 36.0 - 46.0 % 27.4(L) 30.2(L) 25.8(L)  Platelets 150 - 400 K/uL 80(L) 79(L) 97(L)   Lipid Panel     Component Value Date/Time   CHOL 132 06/12/2019 1502   TRIG 62 06/12/2019 1502   HDL 73 06/12/2019 1502   CHOLHDL 1.8 06/12/2019 1502   LDLCALC 46 06/12/2019 1502   HEMOGLOBIN A1C Lab Results  Component Value Date   HGBA1C 6.1 (H) 10/28/2014   MPG 128 10/28/2014   TSH No results for input(s): TSH in the last 8760 hours.  External labs:   Hemoglobin 10.300 g/d 01/12/2020  Creatinine, Serum 1.870 mg/ 01/12/2020 Potassium 4.900 mm 01/12/2020  TSH 1.221  06/21/2019   PRN Meds:. There are no discontinued medications. No outpatient medications have been marked as taking for the 06/23/20 encounter (Appointment) with Adrian Prows, MD.    Cardiac Studies:   01/11/2015: Hospital Lower extremity arterial duplex Evidence of multilevel arterial occlusive disease bilaterally, left greater than right. Resting ankle-brachial index is mildly depressed on the right at 0.85. The left ABI is moderately depressed at rest at 0.65. On the right side, there likely is a component of aortoiliac  inflow disease given monophasic waveforms throughout.  Echocardiogram 11/26/2018: Normal LV systolic function with EF 57%. Left ventricle cavity is normal in size. Normal global wall motion. Doppler evidence of grade I (impaired) diastolic dysfunction, normal LAP. Calculated EF 57%. Left atrial cavity is mildly dilated. Mild (Grade I) mitral regurgitation. Mild to moderate tricuspid regurgitation. Estimated pulmonary artery systolic pressure is 37-16 mmHg. IVC is dilated with blunted respiratory response. Estimated RA pressure 10-15 mmHg.  Lower Extremity Arterial Duplex 01/14/2019: There is monophasic waveform noted in the right external iliac and CFA suggests proximal significant disease. There is severe diffuse mixed plaque noted throughout the right lower extremity.  Moderate velocity increase at the left distal superficial femoral artery, >50% stenosis. There is severe diffuse mixed plaque throughout the left lower extremity This exam reveals moderately decreased perfusion of the right lower extremity, noted at the dorsalis pedis artery level (ABI 0.75) and moderately decreased perfusion of the left lower extremity, noted at the post tibial artery level (ABI 0.58).  No significant change from report of 01/11/2015.   Peripheral arteriogram 03/18/2019:  Dist R EIA to Prox R CFA lesion is 60% stenosed.  Prox R CIA to Mid R EIA lesion is 20% stenosed.  Prox L CIA to Mid L  CFA lesion is 20% stenosed.  Prox L SFA to Dist L SFA lesion is 100% stenosed.  Three-vessel runoff below the knee on the left with brisk  flow.  Failed attempt at angioplasty of the left SFA.   Suprarenal Abd AO to Infrarenal Abd AO lesion is 30% stenosed. Severely calcified lesions.  Abdominal aortogram also reveals patent renal arteries 1 on either side. There is no evidence of abdominal aortic aneurysm.  EKG:   *** EKG 12/04/2019: Marked sinus bradycardia at the rate of 46 bpm, normal axis, right bundle branch block.  No evidence of ischemia.   EKG 06/16/2019: Marked sinus bradycardia at 45 bpm, norma axis, IRBBB  Assessment     ICD-10-CM   1. PAD (peripheral artery disease) (HCC)  I73.9   2. Venous insufficiency of both lower extremities  I87.2   3. Essential hypertension  I10   4. Centrilobular emphysema ([place])  J43.2   5. Varicose veins of bilateral lower extremities with other complications  R67.893     No orders of the defined types were placed in this encounter. There are no discontinued medications.  No orders of the defined types were placed in this encounter.   Recommendations:   [name]  is a 75 y.o. with peripheral artery disease has known long left SFA CTO with failed attempt in September 2020 for revascularization, also has right calcific iliac artery stenosis which was felt to be high risk due to extension into right common femoral artery for dissection. She has right hip claudication and also left leg claudication that is chronic but has remained stable.  Past medical history significant for tobacco use disorder, which she quit on 10/21/2019, hypertension, COPD,Stage III chronic kidney disease, history of breast cancer, bilateral venous insufficiency and has had ablation and persistent superficial varicose veins with complications and recurrent varicose vein bleeding, last occurrence 11/14/2019 needing hospitalization and blood transfusion.  *** I have  advised her that she should use support stockings regularly.  Without this she will continue to have variceal bleed from her legs.  With regard to peripheral arterial disease, symptoms of claudication are remained stable.  There is no limb threatening ischemia.  Her capillary refill is less than 2 seconds,  hence continue medical therapy for now.  I do not see any open wounds.  Hypertension is now well controlled.  She has asymptomatic sinus bradycardia and had discontinued metoprolol on her last office visit but no change in heart rate.  We will continue to monitor this clinically.  Renal function has remained stable.  Lipids are well controlled.  She quit smoking a month ago and I encouraged her to remain abstinent.  I will see her back in 6 months for follow-up.  This was a 40-minute encounter with review of hospital records, outside records and evaluation of procedures.   Adrian Prows, MD, [facility] 06/23/2020, 7:08 AM Office: [phone]

## 2020-07-05 DIAGNOSIS — M961 Postlaminectomy syndrome, not elsewhere classified: Secondary | ICD-10-CM | POA: Diagnosis not present

## 2020-07-05 DIAGNOSIS — M47816 Spondylosis without myelopathy or radiculopathy, lumbar region: Secondary | ICD-10-CM | POA: Diagnosis not present

## 2020-07-05 DIAGNOSIS — G894 Chronic pain syndrome: Secondary | ICD-10-CM | POA: Diagnosis not present

## 2020-07-05 DIAGNOSIS — Z79891 Long term (current) use of opiate analgesic: Secondary | ICD-10-CM | POA: Diagnosis not present

## 2020-07-07 ENCOUNTER — Other Ambulatory Visit: Payer: Self-pay | Admitting: Cardiology

## 2020-07-07 DIAGNOSIS — I10 Essential (primary) hypertension: Secondary | ICD-10-CM

## 2020-07-15 DIAGNOSIS — J432 Centrilobular emphysema: Secondary | ICD-10-CM | POA: Diagnosis not present

## 2020-07-22 ENCOUNTER — Encounter: Payer: Self-pay | Admitting: Cardiology

## 2020-07-22 ENCOUNTER — Ambulatory Visit: Payer: Medicare Other | Admitting: Cardiology

## 2020-07-22 ENCOUNTER — Other Ambulatory Visit: Payer: Self-pay

## 2020-07-22 VITALS — BP 110/57 | HR 80 | Temp 98.7°F | Resp 17 | Ht 60.0 in | Wt 130.2 lb

## 2020-07-22 DIAGNOSIS — J432 Centrilobular emphysema: Secondary | ICD-10-CM | POA: Diagnosis not present

## 2020-07-22 DIAGNOSIS — I739 Peripheral vascular disease, unspecified: Secondary | ICD-10-CM | POA: Diagnosis not present

## 2020-07-22 DIAGNOSIS — R001 Bradycardia, unspecified: Secondary | ICD-10-CM

## 2020-07-22 DIAGNOSIS — I10 Essential (primary) hypertension: Secondary | ICD-10-CM

## 2020-07-22 DIAGNOSIS — E7800 Pure hypercholesterolemia, unspecified: Secondary | ICD-10-CM | POA: Diagnosis not present

## 2020-07-22 NOTE — Progress Notes (Signed)
Primary Physician/Referring:  Maude Leriche, PA-C  Patient ID: [name], female    DOB: Nov 02, 1945, 75 y.o.   MRN: [id number]  Chief complaints: hospital follow up  HPI: [name]  is a 75 y.o. female  with peripheral artery disease has known long left SFA CTO with failed attempt in September 2020 for revascularization, also has right calcific iliac artery stenosis which was felt to be high risk due to extension into right common femoral artery for dissection. She has right hip claudication and also left leg claudication that is chronic but has remained stable.  Past medical history significant for tobacco use disorder, which she quit on 10/21/2019, hypertension, COPD,Stage III chronic kidney disease, history of breast cancer, bilateral venous insufficiency and has had ablation and persistent superficial varicose veins with complications and recurrent varicose vein bleeding, last occurrence 11/14/2019 needing hospitalization and blood transfusion.    She is presently doing well, symptoms of claudication has remained stable.  Her main concern is shortness of breath due to underlying COPD.  She is now on home oxygen for the same.  Fortunately she has not had any further variceal bleed recently.    Past Medical History:  Diagnosis Date  . Acute renal failure superimposed on stage 3 chronic kidney disease ([place]) 10/27/2014  . Back pain, chronic   . Breast cancer ([place]) 09/09/10  . Cancer of colon ([place]) 05/24/2011  . Centrilobular emphysema ([place]) 07/15/2018  . CKD (chronic kidney disease), stage III ([place])   . Claudication, intermittent ([place]) 07/15/2018  . Depression   . GIB (gastrointestinal bleeding)   . H/O tobacco use, presenting hazards to health Quit Dec 2019 05/03/2011   50 years, up to 3PPD quit last year.   Marland Kitchen Hx of varicose veins of lower extremity 07/15/2018  . Hypertension   . SDH (subdural hematoma) ([place])   . SOB (shortness of breath) 07/15/2018    Past Surgical History:   Procedure Laterality Date  . ABDOMINAL AORTOGRAM W/LOWER EXTREMITY Left 03/18/2019   Procedure: ABDOMINAL AORTOGRAM W/LOWER EXTREMITY;  Surgeon: Adrian Prows, MD;  Location: [place] [facility];  Service: Cardiovascular;  Laterality: Left;  . BACK SURGERY    . BREAST LUMPECTOMY Right 10/10/2010  . COLON SURGERY  04/2011  . COLONOSCOPY  05/03/2011   Procedure: COLONOSCOPY;  Surgeon: Jeryl Columbia, MD;  Location[facility] END[facility]COPY;  Service: Endoscopy;  Laterality: N/A;  . JOINT REPLACEMENT     R&L total shoulder replacements  . LUMBAR DISC SURGERY    . PARTIAL HYSTERECTOMY    . PERIPHERAL VASCULAR INTERVENTION  03/18/2019   Procedure: PERIPHERAL VASCULAR INTERVENTION;  Surgeon: Adrian Prows, MD;  Location: [place] [facility];  Service: Cardiovascular;;  attempted left SFA  . SHOULDER SURGERY    . TUMOR REMOVAL  Tumor removed R breast   Social History   Tobacco Use  . Smoking status: Former Smoker    Packs/day: 0.25    Years: 30.00    Pack years: 7.50    Types: Cigarettes    Quit date: 10/31/2019    Years since quitting: 0.7  . Smokeless tobacco: Never Used  . Tobacco comment: had 6 cigarettes recently 03-10-20  Substance Use Topics  . Alcohol use: Yes    Comment: rarely   Marital Status: Divorced   Review of Systems  Constitutional: Positive for malaise/fatigue.  Cardiovascular: Positive for claudication, dyspnea on exertion and leg swelling. Negative for chest pain, orthopnea, palpitations and syncope.  Musculoskeletal: Positive for arthritis and  back pain.  All other systems reviewed and are negative.  Objective  Blood pressure (!) 110/57, pulse 80, temperature 98.7 F (37.1 C), temperature source Temporal, resp. rate 17, height 5' (1.524 m), weight 130 lb 3.2 oz (59.1 kg), SpO2 96 %. Body mass index is 25.43 kg/m.  Vitals with BMI 07/22/2020 04/08/2020 03/10/2020  Height 5\' 0"  5\' 0"  5\' 0"   Weight 130 lbs 3 oz 132 lbs 130 lbs  BMI 25.43 29.79 89.21  Systolic 194 174 081   Diastolic 57 70 68  Pulse 80 53 77     Physical Exam Vitals reviewed.  Constitutional:      Appearance: She is well-developed.  Cardiovascular:     Rate and Rhythm: Normal rate and regular rhythm.     Pulses:          Femoral pulses are 2+ on the right side with bruit.      Popliteal pulses are 1+ on the right side and 1+ on the left side.       Dorsalis pedis pulses are 1+ on the right side and 0 on the left side.       Posterior tibial pulses are 1+ on the right side and 0 on the left side.     Heart sounds: Normal heart sounds.     Comments: Chronic venostasis dermatitis noted. No open wounds. Capillary refill <2 Sec No leg edema Pulmonary:     Effort: Pulmonary effort is normal. No accessory muscle usage or respiratory distress.     Breath sounds: Wheezing (bilateral extensive expiratory ronchi) present.  Abdominal:     General: Bowel sounds are normal.     Palpations: Abdomen is soft.  Skin:    Findings: No erythema.    Radiology: No results found.  Laboratory examination:    CMP Latest Ref Rng & Units 11/15/2019 11/14/2019 07/14/2019  Glucose 70 - 99 mg/dL 89 204(H) 88  BUN 8 - 23 mg/dL 53(H) 77(H) 43(H)  Creatinine 0.44 - 1.00 mg/dL 1.49(H) 2.74(H) 1.39(H)  Sodium 135 - 145 mmol/L 141 137 140  Potassium 3.5 - 5.1 mmol/L 4.2 4.4 4.9  Chloride 98 - 111 mmol/L 105 101 100  CO2 22 - 32 mmol/L 28 25 26   Calcium 8.9 - 10.3 mg/dL 9.3 9.6 10.6(H)  Total Protein 6.5 - 8.1 g/dL 4.7(L) 5.6(L) -  Total Bilirubin 0.3 - 1.2 mg/dL 0.4 0.4 -  Alkaline Phos 38 - 126 U/L 61 92 -  AST 15 - 41 U/L 17 20 -  ALT 0 - 44 U/L 11 14 -   CBC Latest Ref Rng & Units 11/15/2019 11/14/2019 11/14/2019  WBC 4.0 - 10.5 K/uL 3.6(L) 4.2 6.4  Hemoglobin 12.0 - 15.0 g/dL 8.1(L) 9.2(L) 7.1(L)  Hematocrit 36.0 - 46.0 % 27.4(L) 30.2(L) 25.8(L)  Platelets 150 - 400 K/uL 80(L) 79(L) 97(L)   Lipid Panel     Component Value Date/Time   CHOL 132 06/12/2019 1502   TRIG 62 06/12/2019 1502   HDL 73  06/12/2019 1502   CHOLHDL 1.8 06/12/2019 1502   LDLCALC 46 06/12/2019 1502   HEMOGLOBIN A1C Lab Results  Component Value Date   HGBA1C 6.1 (H) 10/28/2014   MPG 128 10/28/2014   TSH No results for input(s): TSH in the last 8760 hours.  External labs:   Hemoglobin 10.300 g/d 01/12/2020  Creatinine, Serum 1.870 mg/ 01/12/2020 Potassium 4.900 mm 01/12/2020  TSH 1.221 06/21/2019   Cardiac Studies:   01/11/2015: Hospital Lower extremity arterial duplex Evidence of  multilevel arterial occlusive disease bilaterally, left greater than right. Resting ankle-brachial index is mildly depressed on the right at 0.85. The left ABI is moderately depressed at rest at 0.65. On the right side, there likely is a component of aortoiliac inflow disease given monophasic waveforms throughout.  Echocardiogram 11/26/2018: Normal LV systolic function with EF 57%. Left ventricle cavity is normal in size. Normal global wall motion. Doppler evidence of grade I (impaired) diastolic dysfunction, normal LAP. Calculated EF 57%. Left atrial cavity is mildly dilated. Mild (Grade I) mitral regurgitation. Mild to moderate tricuspid regurgitation. Estimated pulmonary artery systolic pressure is 95-18 mmHg. IVC is dilated with blunted respiratory response. Estimated RA pressure 10-15 mmHg.  Lower Extremity Arterial Duplex 01/14/2019: There is monophasic waveform noted in the right external iliac and CFA suggests proximal significant disease. There is severe diffuse mixed plaque noted throughout the right lower extremity.  Moderate velocity increase at the left distal superficial femoral artery, >50% stenosis. There is severe diffuse mixed plaque throughout the left lower extremity This exam reveals moderately decreased perfusion of the right lower extremity, noted at the dorsalis pedis artery level (ABI 0.75) and moderately decreased perfusion of the left lower extremity, noted at the post tibial artery level (ABI 0.58).   No significant change from report of 01/11/2015.   Peripheral arteriogram 03/18/2019:  Dist R EIA to Prox R CFA lesion is 60% stenosed.  Prox R CIA to Mid R EIA lesion is 20% stenosed.  Prox L CIA to Mid L CFA lesion is 20% stenosed.  Prox L SFA to Dist L SFA lesion is 100% stenosed.  Three-vessel runoff below the knee on the left with brisk  flow.  Failed attempt at angioplasty of the left SFA.   Suprarenal Abd AO to Infrarenal Abd AO lesion is 30% stenosed. Severely calcified lesions.  Abdominal aortogram also reveals patent renal arteries 1 on either side. There is no evidence of abdominal aortic aneurysm.  EKG:   EKG 07/22/2020: Normal sinus rhythm at rate of 74 bpm, normal axis.  Right bundle branch block.  No evidence of ischemia.  EKG 12/04/2019: Marked sinus bradycardia at the rate of 46 bpm, normal axis, right bundle branch block.  No evidence of ischemia.   Assessment     ICD-10-CM   1. Essential hypertension  I10 EKG 12-Lead  2. PAD (peripheral artery disease) (HCC)  I73.9   3. Bradycardia  R00.1   4. Centrilobular emphysema ([place])  J43.2   5. Hypercholesteremia  E78.00     No orders of the defined types were placed in this encounter. There are no discontinued medications.  Orders Placed This Encounter  Procedures  . EKG 12-Lead    Recommendations:   [name]  is a 75 y.o. with peripheral artery disease has known long left SFA CTO with failed attempt in September 2020 for revascularization, also has right calcific iliac artery stenosis which was felt to be high risk due to extension into right common femoral artery for dissection. She has right hip claudication and also left leg claudication that is chronic but has remained stable and in fact with addition of Neurontin, symptoms are improved.  Past medical history significant for tobacco use disorder, which she quit on 10/21/2019 and has remained abstinent, hypertension, COPD,Stage III chronic kidney  disease, history of breast cancer, bilateral venous insufficiency and has had ablation and persistent superficial varicose veins with complications and recurrent varicose vein bleeding, last occurrence 11/14/2019 needing hospitalization and blood transfusion.  Fortunately  he has no vascular lesions and no ulcerations.  She is wearing support stockings regularly.  I discussed with her that she needs to immediately contact me if she develops any bluish discoloration of her toes or plantar surface of her foot.  Capillary refill is normal.  She is having difficulty wearing support stockings regularly, advised her to try Ace wraps.  Otherwise blood pressure is well controlled, lipids under excellent control, she has complete physical exam coming up next month.  No changes in the medications were done by me today, I will see her back in 6 months for follow-up of her peripheral artery disease, hypertension and hyperlipidemia.    Adrian Prows, MD, [facility] 07/22/2020, 2:10 PM Office: [phone]

## 2020-07-28 DIAGNOSIS — J439 Emphysema, unspecified: Secondary | ICD-10-CM | POA: Diagnosis not present

## 2020-07-28 DIAGNOSIS — N184 Chronic kidney disease, stage 4 (severe): Secondary | ICD-10-CM | POA: Diagnosis not present

## 2020-07-28 DIAGNOSIS — I10 Essential (primary) hypertension: Secondary | ICD-10-CM | POA: Diagnosis not present

## 2020-07-28 DIAGNOSIS — D649 Anemia, unspecified: Secondary | ICD-10-CM | POA: Diagnosis not present

## 2020-07-28 DIAGNOSIS — I739 Peripheral vascular disease, unspecified: Secondary | ICD-10-CM | POA: Diagnosis not present

## 2020-08-03 ENCOUNTER — Ambulatory Visit: Payer: Medicare Other | Admitting: Cardiology

## 2020-08-03 ENCOUNTER — Encounter: Payer: Self-pay | Admitting: Cardiology

## 2020-08-03 ENCOUNTER — Other Ambulatory Visit: Payer: Self-pay

## 2020-08-03 VITALS — BP 129/67 | HR 75 | Temp 97.2°F | Resp 17 | Ht 60.0 in | Wt 127.6 lb

## 2020-08-03 DIAGNOSIS — M1711 Unilateral primary osteoarthritis, right knee: Secondary | ICD-10-CM

## 2020-08-03 DIAGNOSIS — I739 Peripheral vascular disease, unspecified: Secondary | ICD-10-CM

## 2020-08-03 NOTE — Progress Notes (Signed)
Primary Physician/Referring:  Maude Leriche, PA-C  Patient ID: [name], female    DOB: 1945-10-10, 75 y.o.   MRN: [id number]  Chief complaints: hospital follow up  HPI: [name]  is a 75 y.o. female  with peripheral artery disease has known long left SFA CTO with failed attempt in September 2020 for revascularization, also has right calcific iliac artery stenosis which was felt to be high risk due to extension into right common femoral artery for dissection. She has right hip claudication and also left leg claudication that is chronic but has remained stable.  Past medical history significant for tobacco use disorder, which she quit on 10/21/2019, hypertension, COPD,Stage III chronic kidney disease, history of breast cancer, bilateral venous insufficiency and has had ablation and persistent superficial varicose veins with complications and recurrent varicose vein bleeding, last occurrence 11/14/2019 needing hospitalization and blood transfusion.    She made an urgent appointment to see me today, she was supposed to see me in 6 months, stating that her right leg has been hurting.  On further questioning, her pain is located in the right knee.  She has not been able to walk or bear weight or bend her knee.  She denies any bluish discoloration or ulceration in her feet.  No leg edema.   Past Medical History:  Diagnosis Date  . Acute renal failure superimposed on stage 3 chronic kidney disease ([place]) 10/27/2014  . Back pain, chronic   . Breast cancer ([place]) 09/09/10  . Cancer of colon ([place]) 05/24/2011  . Centrilobular emphysema ([place]) 07/15/2018  . CKD (chronic kidney disease), stage III ([place])   . Claudication, intermittent ([place]) 07/15/2018  . Depression   . GIB (gastrointestinal bleeding)   . H/O tobacco use, presenting hazards to health Quit Dec 2019 05/03/2011   50 years, up to 3PPD quit last year.   Marland Kitchen Hx of varicose veins of lower extremity 07/15/2018  . Hypertension   . SDH  (subdural hematoma) ([place])   . SOB (shortness of breath) 07/15/2018    Past Surgical History:  Procedure Laterality Date  . ABDOMINAL AORTOGRAM W/LOWER EXTREMITY Left 03/18/2019   Procedure: ABDOMINAL AORTOGRAM W/LOWER EXTREMITY;  Surgeon: Adrian Prows, MD;  Location: [facility];  Service: Cardiovascular;  Laterality: Left;  . BACK SURGERY    . BREAST LUMPECTOMY Right 10/10/2010  . COLON SURGERY  04/2011  . COLONOSCOPY  05/03/2011   Procedure: COLONOSCOPY;  Surgeon: Jeryl Columbia, MD;  Location: [facility] ENDOSCOPY;  Service: Endoscopy;  Laterality: N/A;  . JOINT REPLACEMENT     R&L total shoulder replacements  . LUMBAR DISC SURGERY    . PARTIAL HYSTERECTOMY    . PERIPHERAL VASCULAR INTERVENTION  03/18/2019   Procedure: PERIPHERAL VASCULAR INTERVENTION;  Surgeon: Adrian Prows, MD;  Location: [place] [facility];  Service: Cardiovascular;;  attempted left SFA  . SHOULDER SURGERY    . TUMOR REMOVAL  Tumor removed R breast   Social History   Tobacco Use  . Smoking status: Former Smoker    Packs/day: 0.25    Years: 30.00    Pack years: 7.50    Types: Cigarettes    Quit date: 10/31/2019    Years since quitting: 0.7  . Smokeless tobacco: Never Used  . Tobacco comment: had 6 cigarettes recently 03-10-20  Substance Use Topics  . Alcohol use: Yes    Comment: rarely   Marital Status: Divorced   Review of Systems  Constitutional: Positive for malaise/fatigue.  Cardiovascular:  Positive for claudication, dyspnea on exertion and leg swelling. Negative for chest pain, orthopnea, palpitations and syncope.  Musculoskeletal: Positive for arthritis, back pain and joint swelling (Right knee).  All other systems reviewed and are negative.  Objective  Blood pressure 129/67, pulse 75, temperature (!) 97.2 F (36.2 C), temperature source Temporal, resp. rate 17, height 5' (1.524 m), weight 127 lb 9.6 oz (57.9 kg), SpO2 94 %. Body mass index is 24.92 kg/m.  Vitals with BMI 08/03/2020 07/22/2020  04/08/2020  Height 5\' 0"  5\' 0"  5\' 0"   Weight 127 lbs 10 oz 130 lbs 3 oz 132 lbs  BMI 24.92 29.47 65.46  Systolic 503 546 568  Diastolic 67 57 70  Pulse 75 80 53     Physical Exam Vitals reviewed.  Constitutional:      Appearance: She is well-developed.  Cardiovascular:     Rate and Rhythm: Normal rate and regular rhythm.     Pulses:          Femoral pulses are 2+ on the right side with bruit.      Popliteal pulses are 1+ on the right side and 1+ on the left side.       Dorsalis pedis pulses are 1+ on the right side and 0 on the left side.       Posterior tibial pulses are 1+ on the right side and 0 on the left side.     Heart sounds: Normal heart sounds.     Comments: Chronic venostasis dermatitis noted. No open wounds. Capillary refill <2 Sec No leg edema Pulmonary:     Effort: Pulmonary effort is normal. No accessory muscle usage or respiratory distress.     Breath sounds: Wheezing (bilateral extensive expiratory ronchi) present.  Abdominal:     General: Bowel sounds are normal.     Palpations: Abdomen is soft.  Skin:    Capillary Refill: Capillary refill takes less than 2 seconds.     Findings: No erythema.    Radiology: No results found.  Laboratory examination:    CMP Latest Ref Rng & Units 11/15/2019 11/14/2019 07/14/2019  Glucose 70 - 99 mg/dL 89 204(H) 88  BUN 8 - 23 mg/dL 53(H) 77(H) 43(H)  Creatinine 0.44 - 1.00 mg/dL 1.49(H) 2.74(H) 1.39(H)  Sodium 135 - 145 mmol/L 141 137 140  Potassium 3.5 - 5.1 mmol/L 4.2 4.4 4.9  Chloride 98 - 111 mmol/L 105 101 100  CO2 22 - 32 mmol/L 28 25 26   Calcium 8.9 - 10.3 mg/dL 9.3 9.6 10.6(H)  Total Protein 6.5 - 8.1 g/dL 4.7(L) 5.6(L) -  Total Bilirubin 0.3 - 1.2 mg/dL 0.4 0.4 -  Alkaline Phos 38 - 126 U/L 61 92 -  AST 15 - 41 U/L 17 20 -  ALT 0 - 44 U/L 11 14 -   CBC Latest Ref Rng & Units 11/15/2019 11/14/2019 11/14/2019  WBC 4.0 - 10.5 K/uL 3.6(L) 4.2 6.4  Hemoglobin 12.0 - 15.0 g/dL 8.1(L) 9.2(L) 7.1(L)  Hematocrit 36.0  - 46.0 % 27.4(L) 30.2(L) 25.8(L)  Platelets 150 - 400 K/uL 80(L) 79(L) 97(L)   Lipid Panel     Component Value Date/Time   CHOL 132 06/12/2019 1502   TRIG 62 06/12/2019 1502   HDL 73 06/12/2019 1502   CHOLHDL 1.8 06/12/2019 1502   LDLCALC 46 06/12/2019 1502   HEMOGLOBIN A1C Lab Results  Component Value Date   HGBA1C 6.1 (H) 10/28/2014   MPG 128 10/28/2014   TSH No results for input(s): TSH  in the last 8760 hours.  External labs:   Hemoglobin 10.300 g/d 01/12/2020  Creatinine, Serum 1.870 mg/ 01/12/2020 Potassium 4.900 mm 01/12/2020  TSH 1.221 06/21/2019   Cardiac Studies:   01/11/2015: Hospital Lower extremity arterial duplex Evidence of multilevel arterial occlusive disease bilaterally, left greater than right. Resting ankle-brachial index is mildly depressed on the right at 0.85. The left ABI is moderately depressed at rest at 0.65. On the right side, there likely is a component of aortoiliac inflow disease given monophasic waveforms throughout.  Echocardiogram 11/26/2018: Normal LV systolic function with EF 57%. Left ventricle cavity is normal in size. Normal global wall motion. Doppler evidence of grade I (impaired) diastolic dysfunction, normal LAP. Calculated EF 57%. Left atrial cavity is mildly dilated. Mild (Grade I) mitral regurgitation. Mild to moderate tricuspid regurgitation. Estimated pulmonary artery systolic pressure is 78-67 mmHg. IVC is dilated with blunted respiratory response. Estimated RA pressure 10-15 mmHg.  Lower Extremity Arterial Duplex 01/14/2019: There is monophasic waveform noted in the right external iliac and CFA suggests proximal significant disease. There is severe diffuse mixed plaque noted throughout the right lower extremity.  Moderate velocity increase at the left distal superficial femoral artery, >50% stenosis. There is severe diffuse mixed plaque throughout the left lower extremity This exam reveals moderately decreased perfusion of the  right lower extremity, noted at the dorsalis pedis artery level (ABI 0.75) and moderately decreased perfusion of the left lower extremity, noted at the post tibial artery level (ABI 0.58).  No significant change from report of 01/11/2015.   Peripheral arteriogram 03/18/2019:  Dist R EIA to Prox R CFA lesion is 60% stenosed.  Prox R CIA to Mid R EIA lesion is 20% stenosed.  Prox L CIA to Mid L CFA lesion is 20% stenosed.  Prox L SFA to Dist L SFA lesion is 100% stenosed.  Three-vessel runoff below the knee on the left with brisk  flow.  Failed attempt at angioplasty of the left SFA.   Suprarenal Abd AO to Infrarenal Abd AO lesion is 30% stenosed. Severely calcified lesions.  Abdominal aortogram also reveals patent renal arteries 1 on either side. There is no evidence of abdominal aortic aneurysm.  EKG:   EKG 07/22/2020: Normal sinus rhythm at rate of 74 bpm, normal axis.  Right bundle branch block.  No evidence of ischemia.  EKG 12/04/2019: Marked sinus bradycardia at the rate of 46 bpm, normal axis, right bundle branch block.  No evidence of ischemia.   Assessment     ICD-10-CM   1. Primary osteoarthritis of right knee  M17.11 AMB referral to orthopedics  2. PAD (peripheral artery disease) (HCC)  I73.9     No orders of the defined types were placed in this encounter. There are no discontinued medications.  Orders Placed This Encounter  Procedures  . AMB referral to orthopedics    Referral Priority:   Routine    Referral Type:   Consultation    Referred to Provider:   [facility]    Requested Specialty:   Orthopedic Surgery    Number of Visits Requested:   1    Recommendations:   [name]  is a 75 y.o. with peripheral artery disease has known long left SFA CTO with failed attempt in September 2020 for revascularization, also has right calcific iliac artery stenosis which was felt to be high risk due to extension into right common femoral artery for dissection. She  has right hip claudication and also left leg claudication that  is chronic, symptoms improved with addition of Neurontin.  Past medical history significant for tobacco use disorder, which she quit on 10/21/2019 and has remained abstinent, hypertension, COPD,Stage III chronic kidney disease, history of breast cancer, bilateral venous insufficiency and has had ablation and persistent superficial varicose veins with complications and recurrent varicose vein bleeding, last occurrence 11/14/2019 needing hospitalization and blood transfusion.  She made an urgent appointment to see me today, she was supposed to see me in 6 months, stating that her right leg has been hurting.  I was concerned about critical limb ischemia.  However on exam, her right knee is warm and mildly swollen and she has significant tenderness in the popliteal fossa.  I do not suspect DVT as pain is clearly focal to the right knee joint.  Vascular examination is normal, capillary refill is <2 seconds in bilateral lower extremity.  Do not suspect vascular etiology.  It is extremely tender to flex her knee, I will make a referral for orthopedics to evaluate her for primary osteoarthritis and possible right knee effusion and may need steroid injection.  She will keep the previous appointment with me for follow-up of peripheral arterial disease.  Otherwise from cardiac standpoint fortunately she has remained stable.     Adrian Prows, MD, [facility] 08/03/2020, 5:59 PM Office: [phone]

## 2020-08-12 DIAGNOSIS — J432 Centrilobular emphysema: Secondary | ICD-10-CM | POA: Diagnosis not present

## 2020-08-13 DIAGNOSIS — M1711 Unilateral primary osteoarthritis, right knee: Secondary | ICD-10-CM | POA: Diagnosis not present

## 2020-08-16 DIAGNOSIS — Z79891 Long term (current) use of opiate analgesic: Secondary | ICD-10-CM | POA: Diagnosis not present

## 2020-08-16 DIAGNOSIS — G894 Chronic pain syndrome: Secondary | ICD-10-CM | POA: Diagnosis not present

## 2020-08-16 DIAGNOSIS — M47816 Spondylosis without myelopathy or radiculopathy, lumbar region: Secondary | ICD-10-CM | POA: Diagnosis not present

## 2020-08-16 DIAGNOSIS — M961 Postlaminectomy syndrome, not elsewhere classified: Secondary | ICD-10-CM | POA: Diagnosis not present

## 2020-09-01 DIAGNOSIS — L728 Other follicular cysts of the skin and subcutaneous tissue: Secondary | ICD-10-CM | POA: Diagnosis not present

## 2020-09-04 ENCOUNTER — Other Ambulatory Visit: Payer: Self-pay | Admitting: Cardiology

## 2020-09-09 DIAGNOSIS — M47816 Spondylosis without myelopathy or radiculopathy, lumbar region: Secondary | ICD-10-CM | POA: Diagnosis not present

## 2020-09-09 DIAGNOSIS — M961 Postlaminectomy syndrome, not elsewhere classified: Secondary | ICD-10-CM | POA: Diagnosis not present

## 2020-09-09 DIAGNOSIS — Z79891 Long term (current) use of opiate analgesic: Secondary | ICD-10-CM | POA: Diagnosis not present

## 2020-09-09 DIAGNOSIS — G894 Chronic pain syndrome: Secondary | ICD-10-CM | POA: Diagnosis not present

## 2020-09-12 DIAGNOSIS — J432 Centrilobular emphysema: Secondary | ICD-10-CM | POA: Diagnosis not present

## 2020-10-07 DIAGNOSIS — M47816 Spondylosis without myelopathy or radiculopathy, lumbar region: Secondary | ICD-10-CM | POA: Diagnosis not present

## 2020-10-07 DIAGNOSIS — G894 Chronic pain syndrome: Secondary | ICD-10-CM | POA: Diagnosis not present

## 2020-10-07 DIAGNOSIS — M961 Postlaminectomy syndrome, not elsewhere classified: Secondary | ICD-10-CM | POA: Diagnosis not present

## 2020-10-07 DIAGNOSIS — Z79891 Long term (current) use of opiate analgesic: Secondary | ICD-10-CM | POA: Diagnosis not present

## 2020-10-12 DIAGNOSIS — J432 Centrilobular emphysema: Secondary | ICD-10-CM | POA: Diagnosis not present

## 2020-11-01 ENCOUNTER — Other Ambulatory Visit: Payer: Self-pay | Admitting: Cardiology

## 2020-11-01 DIAGNOSIS — I10 Essential (primary) hypertension: Secondary | ICD-10-CM

## 2020-11-07 ENCOUNTER — Inpatient Hospital Stay (HOSPITAL_COMMUNITY)
Admission: EM | Admit: 2020-11-07 | Discharge: 2020-11-09 | DRG: 190 | Disposition: A | Discharge status: Home Health | Payer: Medicare Other | Attending: Internal Medicine | Admitting: Internal Medicine

## 2020-11-07 ENCOUNTER — Emergency Department (HOSPITAL_COMMUNITY): Payer: Medicare Other

## 2020-11-07 ENCOUNTER — Other Ambulatory Visit: Payer: Self-pay

## 2020-11-07 ENCOUNTER — Encounter (HOSPITAL_COMMUNITY): Payer: Self-pay | Admitting: Emergency Medicine

## 2020-11-07 DIAGNOSIS — R079 Chest pain, unspecified: Secondary | ICD-10-CM | POA: Diagnosis not present

## 2020-11-07 DIAGNOSIS — I10 Essential (primary) hypertension: Secondary | ICD-10-CM | POA: Diagnosis not present

## 2020-11-07 DIAGNOSIS — Z96612 Presence of left artificial shoulder joint: Secondary | ICD-10-CM | POA: Diagnosis not present

## 2020-11-07 DIAGNOSIS — F419 Anxiety disorder, unspecified: Secondary | ICD-10-CM | POA: Diagnosis present

## 2020-11-07 DIAGNOSIS — G894 Chronic pain syndrome: Secondary | ICD-10-CM | POA: Diagnosis not present

## 2020-11-07 DIAGNOSIS — Z806 Family history of leukemia: Secondary | ICD-10-CM | POA: Diagnosis not present

## 2020-11-07 DIAGNOSIS — R7989 Other specified abnormal findings of blood chemistry: Secondary | ICD-10-CM

## 2020-11-07 DIAGNOSIS — Z85038 Personal history of other malignant neoplasm of large intestine: Secondary | ICD-10-CM

## 2020-11-07 DIAGNOSIS — R0789 Other chest pain: Secondary | ICD-10-CM | POA: Diagnosis not present

## 2020-11-07 DIAGNOSIS — J9621 Acute and chronic respiratory failure with hypoxia: Secondary | ICD-10-CM | POA: Diagnosis not present

## 2020-11-07 DIAGNOSIS — Z743 Need for continuous supervision: Secondary | ICD-10-CM | POA: Diagnosis not present

## 2020-11-07 DIAGNOSIS — Z9981 Dependence on supplemental oxygen: Secondary | ICD-10-CM

## 2020-11-07 DIAGNOSIS — I130 Hypertensive heart and chronic kidney disease with heart failure and stage 1 through stage 4 chronic kidney disease, or unspecified chronic kidney disease: Secondary | ICD-10-CM | POA: Diagnosis present

## 2020-11-07 DIAGNOSIS — Z853 Personal history of malignant neoplasm of breast: Secondary | ICD-10-CM

## 2020-11-07 DIAGNOSIS — Z96611 Presence of right artificial shoulder joint: Secondary | ICD-10-CM | POA: Diagnosis present

## 2020-11-07 DIAGNOSIS — F32A Depression, unspecified: Secondary | ICD-10-CM | POA: Diagnosis not present

## 2020-11-07 DIAGNOSIS — Z66 Do not resuscitate: Secondary | ICD-10-CM | POA: Diagnosis not present

## 2020-11-07 DIAGNOSIS — Z87891 Personal history of nicotine dependence: Secondary | ICD-10-CM | POA: Diagnosis not present

## 2020-11-07 DIAGNOSIS — R059 Cough, unspecified: Secondary | ICD-10-CM | POA: Diagnosis not present

## 2020-11-07 DIAGNOSIS — I5032 Chronic diastolic (congestive) heart failure: Secondary | ICD-10-CM | POA: Diagnosis present

## 2020-11-07 DIAGNOSIS — Z79899 Other long term (current) drug therapy: Secondary | ICD-10-CM | POA: Diagnosis not present

## 2020-11-07 DIAGNOSIS — R778 Other specified abnormalities of plasma proteins: Secondary | ICD-10-CM

## 2020-11-07 DIAGNOSIS — N1832 Chronic kidney disease, stage 3b: Secondary | ICD-10-CM | POA: Diagnosis not present

## 2020-11-07 DIAGNOSIS — Z7982 Long term (current) use of aspirin: Secondary | ICD-10-CM

## 2020-11-07 DIAGNOSIS — R0600 Dyspnea, unspecified: Secondary | ICD-10-CM | POA: Diagnosis not present

## 2020-11-07 DIAGNOSIS — Z20822 Contact with and (suspected) exposure to covid-19: Secondary | ICD-10-CM | POA: Diagnosis not present

## 2020-11-07 DIAGNOSIS — Z833 Family history of diabetes mellitus: Secondary | ICD-10-CM

## 2020-11-07 DIAGNOSIS — R6889 Other general symptoms and signs: Secondary | ICD-10-CM | POA: Diagnosis not present

## 2020-11-07 DIAGNOSIS — R531 Weakness: Secondary | ICD-10-CM | POA: Diagnosis not present

## 2020-11-07 DIAGNOSIS — G2581 Restless legs syndrome: Secondary | ICD-10-CM | POA: Diagnosis not present

## 2020-11-07 DIAGNOSIS — N183 Chronic kidney disease, stage 3 unspecified: Secondary | ICD-10-CM

## 2020-11-07 DIAGNOSIS — J441 Chronic obstructive pulmonary disease with (acute) exacerbation: Secondary | ICD-10-CM | POA: Diagnosis not present

## 2020-11-07 DIAGNOSIS — R0602 Shortness of breath: Secondary | ICD-10-CM | POA: Diagnosis not present

## 2020-11-07 DIAGNOSIS — I499 Cardiac arrhythmia, unspecified: Secondary | ICD-10-CM | POA: Diagnosis not present

## 2020-11-07 LAB — BASIC METABOLIC PANEL
Anion gap: 13 (ref 5–15)
BUN: 41 mg/dL — ABNORMAL HIGH (ref 8–23)
CO2: 33 mmol/L — ABNORMAL HIGH (ref 22–32)
Calcium: 11 mg/dL — ABNORMAL HIGH (ref 8.9–10.3)
Chloride: 98 mmol/L (ref 98–111)
Creatinine, Ser: 1.59 mg/dL — ABNORMAL HIGH (ref 0.44–1.00)
GFR, Estimated: 34 mL/min — ABNORMAL LOW (ref >60)
Glucose, Bld: 148 mg/dL — ABNORMAL HIGH (ref 70–99)
Potassium: 3.7 mmol/L (ref 3.5–5.1)
Sodium: 144 mmol/L (ref 135–145)

## 2020-11-07 LAB — BLOOD GAS, ARTERIAL
Acid-Base Excess: 5.1 mmol/L — ABNORMAL HIGH (ref 0.0–2.0)
Bicarbonate: 30.8 mmol/L — ABNORMAL HIGH (ref 20.0–28.0)
O2 Saturation: 94.4 %
Patient temperature: 97.6
pCO2 arterial: 51.9 mmHg — ABNORMAL HIGH (ref 32.0–48.0)
pH, Arterial: 7.391 (ref 7.350–7.450)
pO2, Arterial: 72.8 mmHg — ABNORMAL LOW (ref 83.0–108.0)

## 2020-11-07 LAB — TROPONIN I (HIGH SENSITIVITY): Troponin I (High Sensitivity): 29 ng/L — ABNORMAL HIGH (ref <18)

## 2020-11-07 LAB — CBC
HCT: 46.3 % — ABNORMAL HIGH (ref 36.0–46.0)
Hemoglobin: 14.9 g/dL (ref 12.0–15.0)
MCH: 30.8 pg (ref 26.0–34.0)
MCHC: 32.2 g/dL (ref 30.0–36.0)
MCV: 95.9 fL (ref 80.0–100.0)
Platelets: 202 10*3/uL (ref 150–400)
RBC: 4.83 MIL/uL (ref 3.87–5.11)
RDW: 12.5 % (ref 11.5–15.5)
WBC: 4.5 10*3/uL (ref 4.0–10.5)
nRBC: 0 % (ref 0.0–0.2)

## 2020-11-07 LAB — BRAIN NATRIURETIC PEPTIDE: B Natriuretic Peptide: 158.7 pg/mL — ABNORMAL HIGH (ref 0.0–100.0)

## 2020-11-07 LAB — MAGNESIUM: Magnesium: 2.6 mg/dL — ABNORMAL HIGH (ref 1.7–2.4)

## 2020-11-07 MED ORDER — METHYLPREDNISOLONE SODIUM SUCC 125 MG IJ SOLR
125.0000 mg | Freq: Once | INTRAMUSCULAR | Status: AC
  Administered 2020-11-07: 125 mg via INTRAVENOUS
  Filled 2020-11-07: qty 2

## 2020-11-07 MED ORDER — ALBUTEROL SULFATE (2.5 MG/3ML) 0.083% IN NEBU
5.0000 mg | INHALATION_SOLUTION | Freq: Once | RESPIRATORY_TRACT | Status: AC
  Administered 2020-11-07: 5 mg via RESPIRATORY_TRACT
  Filled 2020-11-07: qty 6

## 2020-11-07 MED ORDER — IPRATROPIUM BROMIDE 0.02 % IN SOLN
0.5000 mg | Freq: Once | RESPIRATORY_TRACT | Status: AC
  Administered 2020-11-07: 0.5 mg via RESPIRATORY_TRACT
  Filled 2020-11-07: qty 2.5

## 2020-11-07 NOTE — ED Provider Notes (Signed)
[facility] DEPT Provider Note   CSN: [id number] Arrival date & time: 11/07/20  2153     History Chief Complaint  Patient presents with   Shortness of Breath    [name] is a 75 y.o. female with a significant medical history including CHF and COPD presents to the Emergency Department complaining of gradual, persistent, progressively worsening shortness of breath onset yesterday.  Patient reports she was in her usual state of health when she began to gradually feel short of breath yesterday.  She reports she has oxygen at home for use as needed however most days she does not need this.  She reports yesterday she placed the oxygen on at 2 L via nasal cannula and felt well until this afternoon.  This afternoon her symptoms became significantly worse.  She reports associated cough, dyspnea on exertion.  Patient reports she is been compliant with her medications except for her Lasix for which she missed 1 dose on Friday.  Patient reports she does not use a nebulizer or inhaler because she does not like the way it makes her feel.  She is a history of recurrent falls but reports any recent falls.  No specific alleviating factors.  Patient denies fever, chills, headache, neck pain, chest pain, abdominal pain, nausea, vomiting, diarrhea.  Patient reports she feels like she has pneumonia.   The history is provided by the patient and medical records. No language interpreter was used.      Past Medical History:  Diagnosis Date   Acute renal failure superimposed on stage 3 chronic kidney disease ([place]) 10/27/2014   Back pain, chronic    Breast cancer ([place]) 09/09/10   Cancer of colon ([place]) 05/24/2011   Centrilobular emphysema ([place]) 07/15/2018   CKD (chronic kidney disease), stage III (HCC)    Claudication, intermittent ([place]) 07/15/2018   Depression    GIB (gastrointestinal bleeding)    H/O tobacco use, presenting hazards to health Quit Dec 2019 05/03/2011   50 years,  up to 3PPD quit last year.    Hx of varicose veins of lower extremity 07/15/2018   Hypertension    SDH (subdural hematoma) (HCC)    SOB (shortness of breath) 07/15/2018    Patient Active Problem List   Diagnosis Date Noted   Acute blood loss anemia 11/14/2019   Shock ([place]) 06/22/2019   Shock circulatory ([place]) 06/21/2019   Ruptured varicose vein 06/21/2019   COPD (chronic obstructive pulmonary disease) ([place]) 06/21/2019   Chronic diastolic CHF (congestive heart failure) ([place]) 06/21/2019   SOB (shortness of breath) 07/15/2018   Laboratory examination 07/15/2018   Claudication, intermittent ([place]) 07/15/2018   Centrilobular emphysema ([place]) 07/15/2018   Hx of varicose veins of lower extremity 07/15/2018   Back pain 01/21/2016   Sacroiliac joint dysfunction of left side 12/27/2015   History of colon cancer 09/21/2015   Iron deficiency anemia 06/04/2015   Syncope 11/05/2014   Narcotic addiction ([place])    Chronic pain 10/27/2014   Faintness    Frequent falls 08/22/2014   Facial contusion 08/22/2014   Subdural hematoma ([place]) 08/21/2014   Acute kidney injury superimposed on CKD ([place]) 01/20/2013   Acute mastitis of right breast 04/05/2012   Right shoulder pain 03/27/2012   Breast erythema most likely secondary to mastitis / cellulitis 03/24/2012   Hyponatremia 03/24/2012   Cancer of colon ([place]) 05/24/2011   Cancer of right breast, stage 1 ([place]) 05/24/2011   Colonic mass 05/03/2011   H/O tobacco use, presenting hazards  to health 05/03/2011   Weakness generalized 05/01/2011   Anemia 05/01/2011   Falls frequently 05/01/2011   GI bleed 05/01/2011   Hypokalemia 05/01/2011   HTN (hypertension) 05/01/2011   Anxiety and depression 05/01/2011   Lower extremity edema 05/01/2011    Past Surgical History:  Procedure Laterality Date   ABDOMINAL AORTOGRAM W/LOWER EXTREMITY Left 03/18/2019   Procedure: ABDOMINAL AORTOGRAM W/LOWER EXTREMITY;  Surgeon: Adrian Prows, MD;  Location: [facility];  Service: Cardiovascular;  Laterality: Left;   BACK SURGERY     BREAST LUMPECTOMY Right 10/10/2010   COLON SURGERY  04/2011   COLONOSCOPY  05/03/2011   Procedure: COLONOSCOPY;  Surgeon: Jeryl Columbia, MD;  Location: [facility] ENDOSCOPY;  Service: Endoscopy;  Laterality: N/A;   JOINT REPLACEMENT     R&L total shoulder replacements   LUMBAR DISC SURGERY     PARTIAL HYSTERECTOMY     PERIPHERAL VASCULAR INTERVENTION  03/18/2019   Procedure: PERIPHERAL VASCULAR INTERVENTION;  Surgeon: Adrian Prows, MD;  Location: [facility];  Service: Cardiovascular;;  attempted left SFA   SHOULDER SURGERY     TUMOR REMOVAL  Tumor removed R breast     OB History   No obstetric history on file.     Family History  Problem Relation Age of Onset   Diabetes Mother    Hypertension Mother    Cancer Mother        leukemia   Sudden death Mother    Heart attack Mother    Anesthesia problems Neg Hx    Hypotension Neg Hx    Malignant hyperthermia Neg Hx    Pseudochol deficiency Neg Hx    Breast cancer Neg Hx    Lung disease Neg Hx     Social History   Tobacco Use   Smoking status: Former    Packs/day: 0.25    Years: 30.00    Pack years: 7.50    Types: Cigarettes    Quit date: 10/31/2019    Years since quitting: 1.0   Smokeless tobacco: Never   Tobacco comments:    had 6 cigarettes recently 03-10-20  Vaping Use   Vaping Use: Never used  Substance Use Topics   Alcohol use: Yes    Comment: rarely    Drug use: No    Home Medications Prior to Admission medications   Medication Sig Start Date End Date Taking? Authorizing Provider  ALPRAZolam Duanne Moron) 1 MG tablet as needed.  04/22/16   [provider]  amLODipine (NORVASC) 5 MG tablet TAKE 1 TABLET BY MOUTH EVERY DAY 11/01/20   Adrian Prows, MD  aspirin EC 81 MG tablet Take 81 mg by mouth daily. Swallow whole.    [provider]  atorvastatin (LIPITOR) 10 MG tablet TAKE 1 TABLET BY MOUTH EVERY DAY 01/08/20   Adrian Prows, MD   buPROPion (WELLBUTRIN XL) 150 MG 24 hr tablet Take 150 mg by mouth daily.    [provider]  furosemide (LASIX) 20 MG tablet TAKE 1 TABLET BY MOUTH TWICE A DAY Patient taking differently: Take 20 mg by mouth daily. ONCE DAILY 05/03/20   Adrian Prows, MD  gabapentin (NEURONTIN) 100 MG capsule Take 100 mg by mouth 3 (three) times daily. 04/01/19   [provider]  hydrALAZINE (APRESOLINE) 25 MG tablet TAKE 1 TABLET BY MOUTH THREE TIMES A DAY 08/18/19   Miquel Dunn, NP  losartan-hydrochlorothiazide [facility]) 100-12.5 MG tablet TAKE 1 TABLET BY MOUTH EVERY DAY 06/14/20   Einar Gip,  Ulice Dash, MD  mirtazapine (REMERON) 30 MG tablet 30 mg daily.  04/22/16   [provider]  Multiple Vitamin (MULTIVITAMIN WITH MINERALS) TABS tablet Take 1 tablet by mouth daily.    [provider]  oxyCODONE (OXYCONTIN) 60 MG 12 hr tablet Take 1 tablet by mouth every 8 (eight) hours.    [provider]  potassium chloride (KLOR-CON) 10 MEQ tablet TAKE 1 TABLET (10 MEQ TOTAL) BY MOUTH DAILY. TAKE WITH LASIX 09/06/20   Adrian Prows, MD  rOPINIRole (REQUIP) 1 MG tablet Take 1 mg by mouth at bedtime.    [provider]    Allergies    Patient has no known allergies.  Review of Systems   Review of Systems  Constitutional:  Negative for appetite change, diaphoresis, fatigue, fever and unexpected weight change.  HENT:  Negative for mouth sores.   Eyes:  Negative for visual disturbance.  Respiratory:  Positive for cough, chest tightness, shortness of breath and wheezing.   Cardiovascular:  Negative for chest pain.  Gastrointestinal:  Negative for abdominal pain, constipation, diarrhea, nausea and vomiting.  Endocrine: Negative for polydipsia, polyphagia and polyuria.  Genitourinary:  Negative for dysuria, frequency, hematuria and urgency.  Musculoskeletal:  Negative for back pain and neck stiffness.  Skin:  Negative for rash.  Allergic/Immunologic: Negative for  immunocompromised state.  Neurological:  Negative for syncope, light-headedness and headaches.  Hematological:  Does not bruise/bleed easily.  Psychiatric/Behavioral:  Negative for sleep disturbance. The patient is not nervous/anxious.    Physical Exam Updated Vital Signs BP 127/76 (BP Location: Left Arm)   Pulse (!) 106   Temp 97.6 F (36.4 C) (Oral)   Resp 14   Ht 5' (1.524 m)   Wt 56.7 kg   SpO2 93%   BMI 24.41 kg/m   Physical Exam Vitals and nursing note reviewed.  Constitutional:      General: She is not in acute distress.    Appearance: She is well-developed. She is not diaphoretic.     Comments: Awake, alert, nontoxic appearance Chronically ill-appearing  HENT:     Head: Normocephalic and atraumatic.     Mouth/Throat:     Pharynx: No oropharyngeal exudate.  Eyes:     General: No scleral icterus.    Conjunctiva/sclera: Conjunctivae normal.  Cardiovascular:     Rate and Rhythm: Regular rhythm. Tachycardia present.  Pulmonary:     Effort: Tachypnea, accessory muscle usage and respiratory distress present.     Breath sounds: Decreased breath sounds and wheezing present.  Abdominal:     General: Bowel sounds are normal.     Palpations: Abdomen is soft. There is no mass.     Tenderness: There is no abdominal tenderness. There is no guarding or rebound.  Musculoskeletal:        General: Normal range of motion.     Cervical back: Normal range of motion and neck supple.     Right lower leg: No tenderness. No edema.     Left lower leg: No tenderness. No edema.  Skin:    General: Skin is warm and dry.  Neurological:     General: No focal deficit present.     Mental Status: She is alert.     Comments: Speech is clear and goal oriented Moves extremities without ataxia  Psychiatric:        Mood and Affect: Mood normal.    ED Results / Procedures / Treatments   Labs (all labs ordered are listed, but only abnormal  results are displayed) Labs Reviewed  BASIC  METABOLIC PANEL - Abnormal; Notable for the following components:      Result Value   CO2 33 (*)    Glucose, Bld 148 (*)    BUN 41 (*)    Creatinine, Ser 1.59 (*)    Calcium 11.0 (*)    GFR, Estimated 34 (*)    All other components within normal limits  CBC - Abnormal; Notable for the following components:   HCT 46.3 (*)    All other components within normal limits  BLOOD GAS, ARTERIAL - Abnormal; Notable for the following components:   pCO2 arterial 51.9 (*)    pO2, Arterial 72.8 (*)    Bicarbonate 30.8 (*)    Acid-Base Excess 5.1 (*)    All other components within normal limits  BRAIN NATRIURETIC PEPTIDE - Abnormal; Notable for the following components:   B Natriuretic Peptide 158.7 (*)    All other components within normal limits  MAGNESIUM - Abnormal; Notable for the following components:   Magnesium 2.6 (*)    All other components within normal limits  TROPONIN I (HIGH SENSITIVITY) - Abnormal; Notable for the following components:   Troponin I (High Sensitivity) 29 (*)    All other components within normal limits  TROPONIN I (HIGH SENSITIVITY) - Abnormal; Notable for the following components:   Troponin I (High Sensitivity) 42 (*)    All other components within normal limits  SARS CORONAVIRUS 2 (TAT 6-24 HRS)    EKG EKG Interpretation  Date/Time:  Sunday November 07 2020 23:45:53 EDT Ventricular Rate:  96 PR Interval:  140 QRS Duration: 143 QT Interval:  368 QTC Calculation: 465 R Axis:   62 Text Interpretation: Sinus rhythm Ventricular premature complex Left atrial enlargement Right bundle branch block Confirmed by Gerlene Fee [phone]) on 11/07/2020 11:48:54 PM  Radiology DG Chest Port 1 View  Result Date: 11/07/2020 CLINICAL DATA:  Difficulty breathing EXAM: PORTABLE CHEST 1 VIEW COMPARISON:  06/20/2019 FINDINGS: Cardiac shadow is stable. Aortic calcifications are noted. The lungs are well aerated bilaterally. Old rib fractures are noted on the right with healing.  No focal infiltrate is seen. Postsurgical changes in the lumbar spine and bilateral shoulder joints are seen. IMPRESSION: No acute abnormality noted. Electronically Signed   By: Inez Catalina M.D.   On: 11/07/2020 22:54    Procedures Procedures   Medications Ordered in ED Medications  albuterol (PROVENTIL) (2.5 MG/3ML) 0.083% nebulizer solution 5 mg (5 mg Nebulization Given 11/07/20 2304)  ipratropium (ATROVENT) nebulizer solution 0.5 mg (0.5 mg Nebulization Given 11/07/20 2304)  methylPREDNISolone sodium succinate (SOLU-MEDROL) 125 mg/2 mL injection 125 mg (125 mg Intravenous Given 11/07/20 2302)  albuterol (PROVENTIL) (2.5 MG/3ML) 0.083% nebulizer solution 5 mg (5 mg Nebulization Given 11/08/20 0210)  ipratropium (ATROVENT) nebulizer solution 0.5 mg (0.5 mg Nebulization Given 11/08/20 0210)    ED Course  I have reviewed the triage vital signs and the nursing notes.  Pertinent labs & imaging results that were available during my care of the patient were reviewed by me and considered in my medical decision making (see chart for details).  Clinical Course as of 11/08/20 9937  Nancy Fetter Nov 07, 2020  2359 Creatinine(!): 1.59 Baseline [HM]  Mon Nov 08, 2020  0000 Troponin I (High Sensitivity)(!): 29 Elevated, patient continues to deny chest pain [HM]  0000 SpO2: 92 % On 4 L via nasal cannula [HM]  0000 DG Chest Port 1 View No focal infiltrate.  I personally evaluated  these images. [HM]  [id number] Troponin I (High Sensitivity)(!): 42 Rising slightly [HM]  0338 BP(!): 99/49 Pt sleeping [HM]    Clinical Course User Index [HM] Ronasia Isola, Gwenlyn Perking   MDM Rules/Calculators/A&P                          Patient presents with respiratory distress.  History of COPD and CHF.  Given clinical exam more likely to be COPD at this time. Patient given albuterol, Atrovent and Solu-Medrol.  Labs pending.  Patient currently on 4 L of oxygen via nasal cannula with SPO2 at 92%.  11:59 PM BNP elevated.   Troponin elevated at 29.  1:02 AM Pt continues to have significant work of breathing.  Now with wheezing.  Suspect COPD exacerbation.  Will give additional albuterol and Atrovent.    3:35 AM Pt with increased work of breathing, but significantly improved breath sounds.  Pt continues to require 4 L of oxygen via nasal cannula to maintain her oxygen saturations.  Troponin increasing slightly.  Suspect this is secondary to troponin leak from her COPD exacerbation.  Will admit  The patient was discussed with and evaluated by Dr. Sedonia Small who agrees with the treatment plan.   Final Clinical Impression(s) / ED Diagnoses Final diagnoses:  COPD exacerbation ([place])  Elevated troponin    Rx / DC Orders ED Discharge Orders     None        Staley Budzinski, Gwenlyn Perking 11/08/20 [id number]    Maudie Flakes, MD 11/08/20 [phone]

## 2020-11-07 NOTE — ED Triage Notes (Signed)
Patient arrives via EMS from home. Patient hx of COPD and CHF and is on 2L [place] at baseline. Patient given x2 duo-neb en route, unable to obtain IV, so unable to give mag or solumedrol. Patient states this has never happened before.

## 2020-11-07 NOTE — ED Notes (Addendum)
Patient noted to desat on 2L [place] to 87. Patient O2 increased to 4L and patient is now 97%. Patient attempted to go to x-ray but was unable to tolerate.

## 2020-11-08 ENCOUNTER — Encounter (HOSPITAL_COMMUNITY): Payer: Self-pay | Admitting: Family Medicine

## 2020-11-08 ENCOUNTER — Inpatient Hospital Stay (HOSPITAL_COMMUNITY): Payer: Medicare Other

## 2020-11-08 DIAGNOSIS — J9811 Atelectasis: Secondary | ICD-10-CM | POA: Diagnosis not present

## 2020-11-08 DIAGNOSIS — J9601 Acute respiratory failure with hypoxia: Secondary | ICD-10-CM

## 2020-11-08 DIAGNOSIS — I130 Hypertensive heart and chronic kidney disease with heart failure and stage 1 through stage 4 chronic kidney disease, or unspecified chronic kidney disease: Secondary | ICD-10-CM | POA: Diagnosis present

## 2020-11-08 DIAGNOSIS — R7989 Other specified abnormal findings of blood chemistry: Secondary | ICD-10-CM

## 2020-11-08 DIAGNOSIS — Z9981 Dependence on supplemental oxygen: Secondary | ICD-10-CM | POA: Diagnosis not present

## 2020-11-08 DIAGNOSIS — Z806 Family history of leukemia: Secondary | ICD-10-CM | POA: Diagnosis not present

## 2020-11-08 DIAGNOSIS — Z66 Do not resuscitate: Secondary | ICD-10-CM | POA: Diagnosis present

## 2020-11-08 DIAGNOSIS — G2581 Restless legs syndrome: Secondary | ICD-10-CM | POA: Diagnosis present

## 2020-11-08 DIAGNOSIS — I10 Essential (primary) hypertension: Secondary | ICD-10-CM | POA: Diagnosis not present

## 2020-11-08 DIAGNOSIS — J441 Chronic obstructive pulmonary disease with (acute) exacerbation: Secondary | ICD-10-CM | POA: Diagnosis not present

## 2020-11-08 DIAGNOSIS — G894 Chronic pain syndrome: Secondary | ICD-10-CM | POA: Diagnosis not present

## 2020-11-08 DIAGNOSIS — N1832 Chronic kidney disease, stage 3b: Secondary | ICD-10-CM | POA: Diagnosis not present

## 2020-11-08 DIAGNOSIS — Z853 Personal history of malignant neoplasm of breast: Secondary | ICD-10-CM | POA: Diagnosis not present

## 2020-11-08 DIAGNOSIS — Z7982 Long term (current) use of aspirin: Secondary | ICD-10-CM | POA: Diagnosis not present

## 2020-11-08 DIAGNOSIS — Z20822 Contact with and (suspected) exposure to covid-19: Secondary | ICD-10-CM | POA: Diagnosis present

## 2020-11-08 DIAGNOSIS — F32A Depression, unspecified: Secondary | ICD-10-CM | POA: Diagnosis present

## 2020-11-08 DIAGNOSIS — R059 Cough, unspecified: Secondary | ICD-10-CM | POA: Diagnosis present

## 2020-11-08 DIAGNOSIS — R0602 Shortness of breath: Secondary | ICD-10-CM

## 2020-11-08 DIAGNOSIS — R778 Other specified abnormalities of plasma proteins: Secondary | ICD-10-CM

## 2020-11-08 DIAGNOSIS — Z833 Family history of diabetes mellitus: Secondary | ICD-10-CM | POA: Diagnosis not present

## 2020-11-08 DIAGNOSIS — R531 Weakness: Secondary | ICD-10-CM

## 2020-11-08 DIAGNOSIS — J9621 Acute and chronic respiratory failure with hypoxia: Secondary | ICD-10-CM | POA: Diagnosis present

## 2020-11-08 DIAGNOSIS — M47816 Spondylosis without myelopathy or radiculopathy, lumbar region: Secondary | ICD-10-CM | POA: Diagnosis not present

## 2020-11-08 DIAGNOSIS — Z96611 Presence of right artificial shoulder joint: Secondary | ICD-10-CM | POA: Diagnosis present

## 2020-11-08 DIAGNOSIS — F419 Anxiety disorder, unspecified: Secondary | ICD-10-CM | POA: Diagnosis present

## 2020-11-08 DIAGNOSIS — Z85038 Personal history of other malignant neoplasm of large intestine: Secondary | ICD-10-CM | POA: Diagnosis not present

## 2020-11-08 DIAGNOSIS — I5032 Chronic diastolic (congestive) heart failure: Secondary | ICD-10-CM | POA: Diagnosis present

## 2020-11-08 DIAGNOSIS — N183 Chronic kidney disease, stage 3 unspecified: Secondary | ICD-10-CM

## 2020-11-08 DIAGNOSIS — M961 Postlaminectomy syndrome, not elsewhere classified: Secondary | ICD-10-CM | POA: Diagnosis not present

## 2020-11-08 DIAGNOSIS — Z79891 Long term (current) use of opiate analgesic: Secondary | ICD-10-CM | POA: Diagnosis not present

## 2020-11-08 DIAGNOSIS — Z96612 Presence of left artificial shoulder joint: Secondary | ICD-10-CM | POA: Diagnosis present

## 2020-11-08 DIAGNOSIS — S2231XA Fracture of one rib, right side, initial encounter for closed fracture: Secondary | ICD-10-CM | POA: Diagnosis not present

## 2020-11-08 DIAGNOSIS — Z79899 Other long term (current) drug therapy: Secondary | ICD-10-CM | POA: Diagnosis not present

## 2020-11-08 DIAGNOSIS — Z87891 Personal history of nicotine dependence: Secondary | ICD-10-CM | POA: Diagnosis not present

## 2020-11-08 LAB — COMPREHENSIVE METABOLIC PANEL
ALT: 20 U/L (ref 0–44)
AST: 25 U/L (ref 15–41)
Albumin: 3.9 g/dL (ref 3.5–5.0)
Alkaline Phosphatase: 107 U/L (ref 38–126)
Anion gap: 8 (ref 5–15)
BUN: 37 mg/dL — ABNORMAL HIGH (ref 8–23)
CO2: 33 mmol/L — ABNORMAL HIGH (ref 22–32)
Calcium: 10.5 mg/dL — ABNORMAL HIGH (ref 8.9–10.3)
Chloride: 98 mmol/L (ref 98–111)
Creatinine, Ser: 1.3 mg/dL — ABNORMAL HIGH (ref 0.44–1.00)
GFR, Estimated: 43 mL/min — ABNORMAL LOW (ref >60)
Glucose, Bld: 156 mg/dL — ABNORMAL HIGH (ref 70–99)
Potassium: 4.3 mmol/L (ref 3.5–5.1)
Sodium: 139 mmol/L (ref 135–145)
Total Bilirubin: 0.5 mg/dL (ref 0.3–1.2)
Total Protein: 7.3 g/dL (ref 6.5–8.1)

## 2020-11-08 LAB — CBC WITH DIFFERENTIAL/PLATELET
Abs Immature Granulocytes: 0.01 10*3/uL (ref 0.00–0.07)
Basophils Absolute: 0 10*3/uL (ref 0.0–0.1)
Basophils Relative: 0 %
Eosinophils Absolute: 0 10*3/uL (ref 0.0–0.5)
Eosinophils Relative: 0 %
HCT: 41.1 % (ref 36.0–46.0)
Hemoglobin: 13.6 g/dL (ref 12.0–15.0)
Immature Granulocytes: 0 %
Lymphocytes Relative: 10 %
Lymphs Abs: 0.4 10*3/uL — ABNORMAL LOW (ref 0.7–4.0)
MCH: 31 pg (ref 26.0–34.0)
MCHC: 33.1 g/dL (ref 30.0–36.0)
MCV: 93.6 fL (ref 80.0–100.0)
Monocytes Absolute: 0.1 10*3/uL (ref 0.1–1.0)
Monocytes Relative: 4 %
Neutro Abs: 3.3 10*3/uL (ref 1.7–7.7)
Neutrophils Relative %: 86 %
Platelets: 192 10*3/uL (ref 150–400)
RBC: 4.39 MIL/uL (ref 3.87–5.11)
RDW: 12.5 % (ref 11.5–15.5)
WBC: 3.8 10*3/uL — ABNORMAL LOW (ref 4.0–10.5)
nRBC: 0 % (ref 0.0–0.2)

## 2020-11-08 LAB — TROPONIN I (HIGH SENSITIVITY)
Troponin I (High Sensitivity): 42 ng/L — ABNORMAL HIGH (ref <18)
Troponin I (High Sensitivity): 40 ng/L — ABNORMAL HIGH (ref <18)
Troponin I (High Sensitivity): 43 ng/L — ABNORMAL HIGH (ref <18)

## 2020-11-08 LAB — ECHOCARDIOGRAM COMPLETE
Area-P 1/2: 4.31 cm2
Height: 60 in
S' Lateral: 2.4 cm
Weight: 2000 oz

## 2020-11-08 LAB — MAGNESIUM: Magnesium: 2.3 mg/dL (ref 1.7–2.4)

## 2020-11-08 LAB — PHOSPHORUS: Phosphorus: 1.6 mg/dL — ABNORMAL LOW (ref 2.5–4.6)

## 2020-11-08 LAB — SARS CORONAVIRUS 2 (TAT 6-24 HRS): SARS Coronavirus 2: NEGATIVE

## 2020-11-08 MED ORDER — METHYLPREDNISOLONE SODIUM SUCC 125 MG IJ SOLR
80.0000 mg | Freq: Three times a day (TID) | INTRAMUSCULAR | Status: AC
  Administered 2020-11-08 (×3): 80 mg via INTRAVENOUS
  Filled 2020-11-08 (×3): qty 2

## 2020-11-08 MED ORDER — ALBUTEROL SULFATE (2.5 MG/3ML) 0.083% IN NEBU
5.0000 mg | INHALATION_SOLUTION | RESPIRATORY_TRACT | Status: AC
  Administered 2020-11-08 (×2): 5 mg via RESPIRATORY_TRACT
  Filled 2020-11-08 (×2): qty 6

## 2020-11-08 MED ORDER — ACETAMINOPHEN 650 MG RE SUPP: 650.0000 mg | Freq: Four times a day (QID) | RECTAL | Status: DC | PRN

## 2020-11-08 MED ORDER — HYDRALAZINE HCL 25 MG PO TABS
25.0000 mg | ORAL_TABLET | Freq: Three times a day (TID) | ORAL | Status: DC
  Administered 2020-11-08 – 2020-11-09 (×4): 25 mg via ORAL
  Filled 2020-11-08 (×5): qty 1

## 2020-11-08 MED ORDER — FUROSEMIDE 20 MG PO TABS
20.0000 mg | ORAL_TABLET | Freq: Every day | ORAL | Status: DC
  Administered 2020-11-08 – 2020-11-09 (×2): 20 mg via ORAL
  Filled 2020-11-08 (×2): qty 1

## 2020-11-08 MED ORDER — POTASSIUM CHLORIDE ER 10 MEQ PO TBCR
10.0000 meq | EXTENDED_RELEASE_TABLET | Freq: Every day | ORAL | Status: DC
  Administered 2020-11-08 – 2020-11-09 (×2): 10 meq via ORAL
  Filled 2020-11-08 (×4): qty 1

## 2020-11-08 MED ORDER — SODIUM CHLORIDE 0.9% FLUSH
3.0000 mL | INTRAVENOUS | Status: DC | PRN
  Administered 2020-11-08 (×2): 3 mL via INTRAVENOUS

## 2020-11-08 MED ORDER — GABAPENTIN 100 MG PO CAPS
100.0000 mg | ORAL_CAPSULE | Freq: Three times a day (TID) | ORAL | Status: DC
  Administered 2020-11-08 – 2020-11-09 (×4): 100 mg via ORAL
  Filled 2020-11-08 (×5): qty 1

## 2020-11-08 MED ORDER — POTASSIUM PHOSPHATES 15 MMOLE/5ML IV SOLN
20.0000 mmol | Freq: Once | INTRAVENOUS | Status: AC
  Administered 2020-11-08: 20 mmol via INTRAVENOUS
  Filled 2020-11-08: qty 6.67

## 2020-11-08 MED ORDER — DOXYCYCLINE HYCLATE 100 MG PO TABS
100.0000 mg | ORAL_TABLET | Freq: Two times a day (BID) | ORAL | Status: DC
  Administered 2020-11-08 – 2020-11-09 (×3): 100 mg via ORAL
  Filled 2020-11-08 (×3): qty 1

## 2020-11-08 MED ORDER — LOSARTAN POTASSIUM-HCTZ 100-12.5 MG PO TABS: 1.0000 | ORAL_TABLET | Freq: Every day | ORAL | Status: DC

## 2020-11-08 MED ORDER — SODIUM CHLORIDE 0.9% FLUSH
3.0000 mL | Freq: Two times a day (BID) | INTRAVENOUS | Status: DC
  Administered 2020-11-08 – 2020-11-09 (×3): 3 mL via INTRAVENOUS

## 2020-11-08 MED ORDER — ALPRAZOLAM 1 MG PO TABS
1.0000 mg | ORAL_TABLET | Freq: Two times a day (BID) | ORAL | Status: DC | PRN
  Administered 2020-11-08 – 2020-11-09 (×3): 1 mg via ORAL
  Filled 2020-11-08 (×3): qty 1

## 2020-11-08 MED ORDER — ASPIRIN EC 81 MG PO TBEC
81.0000 mg | DELAYED_RELEASE_TABLET | Freq: Every day | ORAL | Status: DC
  Administered 2020-11-08 – 2020-11-09 (×2): 81 mg via ORAL
  Filled 2020-11-08 (×2): qty 1

## 2020-11-08 MED ORDER — OXYCODONE HCL ER 20 MG PO T12A
60.0000 mg | EXTENDED_RELEASE_TABLET | Freq: Three times a day (TID) | ORAL | Status: DC
  Administered 2020-11-08 – 2020-11-09 (×4): 60 mg via ORAL
  Filled 2020-11-08 (×4): qty 3

## 2020-11-08 MED ORDER — ONDANSETRON HCL 4 MG PO TABS: 4.0000 mg | ORAL_TABLET | Freq: Four times a day (QID) | ORAL | Status: DC | PRN

## 2020-11-08 MED ORDER — SODIUM CHLORIDE 0.9 % IV SOLN: 250.0000 mL | INTRAVENOUS | Status: DC | PRN

## 2020-11-08 MED ORDER — AMLODIPINE BESYLATE 5 MG PO TABS
5.0000 mg | ORAL_TABLET | Freq: Every day | ORAL | Status: DC
  Administered 2020-11-08 – 2020-11-09 (×2): 5 mg via ORAL
  Filled 2020-11-08 (×2): qty 1

## 2020-11-08 MED ORDER — IPRATROPIUM-ALBUTEROL 0.5-2.5 (3) MG/3ML IN SOLN
3.0000 mL | RESPIRATORY_TRACT | Status: DC | PRN
  Administered 2020-11-08 (×2): 3 mL via RESPIRATORY_TRACT
  Filled 2020-11-08: qty 3

## 2020-11-08 MED ORDER — ROPINIROLE HCL 1 MG PO TABS
1.0000 mg | ORAL_TABLET | Freq: Every day | ORAL | Status: DC
  Administered 2020-11-08: 1 mg via ORAL
  Filled 2020-11-08: qty 1

## 2020-11-08 MED ORDER — ONDANSETRON HCL 4 MG/2ML IJ SOLN: 4.0000 mg | Freq: Four times a day (QID) | INTRAMUSCULAR | Status: DC | PRN

## 2020-11-08 MED ORDER — ACETAMINOPHEN 325 MG PO TABS
650.0000 mg | ORAL_TABLET | Freq: Four times a day (QID) | ORAL | Status: DC | PRN
  Administered 2020-11-08 – 2020-11-09 (×3): 650 mg via ORAL
  Filled 2020-11-08 (×3): qty 2

## 2020-11-08 MED ORDER — GUAIFENESIN ER 600 MG PO TB12
1200.0000 mg | ORAL_TABLET | Freq: Two times a day (BID) | ORAL | Status: DC
  Administered 2020-11-08 – 2020-11-09 (×3): 1200 mg via ORAL
  Filled 2020-11-08 (×3): qty 2

## 2020-11-08 MED ORDER — IPRATROPIUM-ALBUTEROL 0.5-2.5 (3) MG/3ML IN SOLN
3.0000 mL | Freq: Four times a day (QID) | RESPIRATORY_TRACT | Status: DC
  Administered 2020-11-08: 3 mL via RESPIRATORY_TRACT
  Filled 2020-11-08 (×3): qty 3

## 2020-11-08 MED ORDER — HEPARIN SODIUM (PORCINE) 5000 UNIT/ML IJ SOLN
5000.0000 [IU] | Freq: Three times a day (TID) | INTRAMUSCULAR | Status: DC
  Administered 2020-11-08 – 2020-11-09 (×3): 5000 [IU] via SUBCUTANEOUS
  Filled 2020-11-08 (×3): qty 1

## 2020-11-08 MED ORDER — ATORVASTATIN CALCIUM 10 MG PO TABS
10.0000 mg | ORAL_TABLET | Freq: Every day | ORAL | Status: DC
  Administered 2020-11-08 – 2020-11-09 (×2): 10 mg via ORAL
  Filled 2020-11-08 (×2): qty 1

## 2020-11-08 MED ORDER — BUPROPION HCL ER (XL) 150 MG PO TB24
150.0000 mg | ORAL_TABLET | Freq: Every day | ORAL | Status: DC
  Administered 2020-11-08 – 2020-11-09 (×2): 150 mg via ORAL
  Filled 2020-11-08 (×2): qty 1

## 2020-11-08 MED ORDER — HYDROCHLOROTHIAZIDE 12.5 MG PO CAPS
12.5000 mg | ORAL_CAPSULE | Freq: Every day | ORAL | Status: DC
  Administered 2020-11-08 – 2020-11-09 (×2): 12.5 mg via ORAL
  Filled 2020-11-08 (×2): qty 1

## 2020-11-08 MED ORDER — IPRATROPIUM BROMIDE 0.02 % IN SOLN
0.5000 mg | RESPIRATORY_TRACT | Status: AC
  Administered 2020-11-08 (×2): 0.5 mg via RESPIRATORY_TRACT
  Filled 2020-11-08 (×2): qty 2.5

## 2020-11-08 MED ORDER — MIRTAZAPINE 15 MG PO TABS
30.0000 mg | ORAL_TABLET | Freq: Every day | ORAL | Status: DC
  Administered 2020-11-08 – 2020-11-09 (×2): 30 mg via ORAL
  Filled 2020-11-08 (×2): qty 2

## 2020-11-08 MED ORDER — LOSARTAN POTASSIUM 50 MG PO TABS
100.0000 mg | ORAL_TABLET | Freq: Every day | ORAL | Status: DC
  Administered 2020-11-08 – 2020-11-09 (×2): 100 mg via ORAL
  Filled 2020-11-08 (×2): qty 2
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
## 2020-11-08 NOTE — H&P (Signed)
History and Physical    Holly S Dusenbury ZLD:[id number] DOB: Dec 27, 1945 DOA: 11/07/2020  PCP: Maude Leriche, PA-C   Patient coming from: Home  Chief Complaint: SOB  HPI: [name] is a 75 y.o. female with medical history significant for CKD 3, diastolic CHF, HTN, breast cancer, COPD who presents for evaluation of progressively worsening shortness of breath.  She reports symptoms began yesterday and have progressively gotten worse over the last 24 hours.  She does have oxygen at home but only uses it intermittently when she needs it.  She did try putting her oxygen at 2 L/min yesterday but continued to feel short of breath so called EMS.  She was increased to 4 L of oxygen by nasal cannula which she has had to continue in the emergency room to maintain O2 sats above 92%.  She reports she has had increasing cough with a thick green phlegm production in the last 1 to 2 days.  She normally has a cough with very thin scant clear phlegm she states.  States has been taking her medications as prescribed except for she does admit to missing a dose of Lasix last Friday.  She does not use a nebulizer at home as she does not like the way it makes her feel.  She denies having any recent falls.  States she has not had any fevers, chills, headache, chest pain, chest pressure, nausea vomiting or diarrhea, urinary symptoms.  She has a history of smoking but quit a year ago.  ED Course: In the emergency room she has been hemodynamically stable.  She has required 4 L of oxygen by nasal cannula.  Chest x-ray is negative for infiltrate or consolidation.  He was unremarkable.  Sodium 144, potassium 3.7, chloride 98, bicarb 33, creatinine 1.59, BUN 41, magnesium 2.6, calcium 11.  BNP 158.7.  Troponin 29 initially with the second level being 42 and she is negative for COVID 19.  Hospitalist service asked to admit for further management  Review of Systems:  General: Denies fever, chills, weight loss, night sweats.   Denies dizziness.  Denies change in appetite HENT: Denies head trauma, headache, denies change in hearing, tinnitus.  Denies nasal congestion or bleeding.  Denies sore throat, sores in mouth.  Denies difficulty swallowing Eyes: Denies blurry vision, pain in eye, drainage.  Denies discoloration of eyes. Neck: Denies pain.  Denies swelling.  Denies pain with movement. Cardiovascular: Denies chest pain, palpitations.  Denies edema.  Denies orthopnea Respiratory: Reports shortness of breath, cough with thick green phlem.  Gastrointestinal: Denies abdominal pain, swelling.  Denies nausea, vomiting, diarrhea.  Denies melena.  Denies hematemesis. Musculoskeletal: Denies limitation of movement.  Denies deformity or swelling.  Denies pain.  Denies arthralgias or myalgias. Genitourinary: Denies pelvic pain.  Denies urinary frequency or hesitancy.  Denies dysuria.  Skin: Denies rash.  Denies petechiae, purpura, ecchymosis. Neurological: Denies syncope.  Denies seizure activity.  Denies paresthesia.  Denies slurred speech, drooping face.  Denies visual change. Psychiatric: Denies depression, anxiety.  Denies hallucinations.  Past Medical History:  Diagnosis Date   Acute renal failure superimposed on stage 3 chronic kidney disease ([place]) 10/27/2014   Back pain, chronic    Breast cancer ([place]) 09/09/10   Cancer of colon ([place]) 05/24/2011   Centrilobular emphysema ([place]) 07/15/2018   CKD (chronic kidney disease), stage III (HCC)    Claudication, intermittent ([place]) 07/15/2018   Depression    GIB (gastrointestinal bleeding)    H/O tobacco use, presenting hazards to health  Quit Dec 2019 05/03/2011   50 years, up to 3PPD quit last year.    Hx of varicose veins of lower extremity 07/15/2018   Hypertension    SDH (subdural hematoma) (HCC)    SOB (shortness of breath) 07/15/2018    Past Surgical History:  Procedure Laterality Date   ABDOMINAL AORTOGRAM W/LOWER EXTREMITY Left 03/18/2019   Procedure: ABDOMINAL  AORTOGRAM W/LOWER EXTREMITY;  Surgeon: Adrian Prows, MD;  Location: [facility];  Service: Cardiovascular;  Laterality: Left;   BACK SURGERY     BREAST LUMPECTOMY Right 10/10/2010   COLON SURGERY  04/2011   COLONOSCOPY  05/03/2011   Procedure: COLONOSCOPY;  Surgeon: Jeryl Columbia, MD;  Location: [facility] ENDOSCOPY;  Service: Endoscopy;  Laterality: N/A;   JOINT REPLACEMENT     R&L total shoulder replacements   LUMBAR DISC SURGERY     PARTIAL HYSTERECTOMY     PERIPHERAL VASCULAR INTERVENTION  03/18/2019   Procedure: PERIPHERAL VASCULAR INTERVENTION;  Surgeon: Adrian Prows, MD;  Location: [facility];  Service: Cardiovascular;;  attempted left SFA   SHOULDER SURGERY     TUMOR REMOVAL  Tumor removed R breast    Social History  reports that she quit smoking about a year ago. Her smoking use included cigarettes. She has a 7.50 pack-year smoking history. She has never used smokeless tobacco. She reports current alcohol use. She reports that she does not use drugs.  No Known Allergies  Family History  Problem Relation Age of Onset   Diabetes Mother    Hypertension Mother    Cancer Mother        leukemia   Sudden death Mother    Heart attack Mother    Anesthesia problems Neg Hx    Hypotension Neg Hx    Malignant hyperthermia Neg Hx    Pseudochol deficiency Neg Hx    Breast cancer Neg Hx    Lung disease Neg Hx      Prior to Admission medications   Medication Sig Start Date End Date Taking? Authorizing Provider  ALPRAZolam Duanne Moron) 1 MG tablet as needed.  04/22/16  Yes [provider]  amLODipine (NORVASC) 5 MG tablet TAKE 1 TABLET BY MOUTH EVERY DAY 11/01/20  Yes Adrian Prows, MD  aspirin EC 81 MG tablet Take 81 mg by mouth daily. Swallow whole.   Yes [provider]  buPROPion (WELLBUTRIN XL) 150 MG 24 hr tablet Take 150 mg by mouth daily.   Yes [provider]  furosemide (LASIX) 20 MG tablet TAKE 1 TABLET BY MOUTH TWICE A DAY Patient taking differently:  Take 20 mg by mouth daily. ONCE DAILY 05/03/20  Yes Adrian Prows, MD  gabapentin (NEURONTIN) 100 MG capsule Take 100 mg by mouth 3 (three) times daily. 04/01/19  Yes [provider]  losartan-hydrochlorothiazide (HYZAAR) 100-12.5 MG tablet TAKE 1 TABLET BY MOUTH EVERY DAY 06/14/20  Yes Adrian Prows, MD  mirtazapine (REMERON) 30 MG tablet 30 mg daily.  04/22/16  Yes [provider]  Multiple Vitamin (MULTIVITAMIN WITH MINERALS) TABS tablet Take 1 tablet by mouth daily.   Yes [provider]  oxyCODONE (OXYCONTIN) 60 MG 12 hr tablet Take 1 tablet by mouth every 8 (eight) hours.   Yes [provider]  potassium chloride (KLOR-CON) 10 MEQ tablet TAKE 1 TABLET (10 MEQ TOTAL) BY MOUTH DAILY. TAKE WITH LASIX 09/06/20  Yes Adrian Prows, MD  rOPINIRole (REQUIP) 1 MG tablet Take 1 mg by mouth at bedtime.   Yes  [provider]  atorvastatin (LIPITOR) 10 MG tablet TAKE 1 TABLET BY MOUTH EVERY DAY 01/08/20   Adrian Prows, MD  hydrALAZINE (APRESOLINE) 25 MG tablet TAKE 1 TABLET BY MOUTH THREE TIMES A DAY 08/18/19   Miquel Dunn, NP    Physical Exam: Vitals:   11/08/20 0430 11/08/20 0500 11/08/20 0515 11/08/20 0530  BP:  (!) 161/81    Pulse: (!) 103 (!) 110 100 (!) 107  Resp:  16    Temp:      TempSrc:      SpO2: 95% 94% 95% 97%  Weight:      Height:        Constitutional: NAD, calm, comfortable Vitals:   11/08/20 0430 11/08/20 0500 11/08/20 0515 11/08/20 0530  BP:  (!) 161/81    Pulse: (!) 103 (!) 110 100 (!) 107  Resp:  16    Temp:      TempSrc:      SpO2: 95% 94% 95% 97%  Weight:      Height:       General: WDWN, Alert and oriented x3.  Eyes: EOMI, PERRL, conjunctivae normal.  Sclera nonicteric HENT:  [place]/AT, external ears normal.  Nares patent without epistasis.  Mucous membranes are moist.  Dentulous Neck: Soft, normal range of motion, supple, no masses, Trachea midline Respiratory: Diminished breath sounds bilaterally.  Diffuse Rales.  Diffuse  expiratory wheezing. no crackles. Normal respiratory effort. No accessory muscle use.  Cardiovascular: Regular rate and rhythm, no murmurs / rubs / gallops. No extremity edema. 2+ pedal pulses.  Abdomen: Soft, no tenderness, nondistended, no rebound or guarding.  No masses palpated. No hepatosplenomegaly. Bowel sounds normoactive Musculoskeletal: FROM. no cyanosis. No joint deformity upper and lower extremities. Normal muscle tone.  Skin: Warm, dry, intact no rashes, lesions, ulcers. No induration Neurologic: CN 2-12 grossly intact.  Normal speech.  Sensation intact, Strength 4/5 in all extremities.   Psychiatric: Normal judgment and insight.  Normal mood.    Labs on Admission: I have personally reviewed following labs and imaging studies  CBC: Recent Labs  Lab 11/07/20 2250  WBC 4.5  HGB 14.9  HCT 46.3*  MCV 95.9  PLT 785    Basic Metabolic Panel: Recent Labs  Lab 11/07/20 2250  NA 144  K 3.7  CL 98  CO2 33*  GLUCOSE 148*  BUN 41*  CREATININE 1.59*  CALCIUM 11.0*  MG 2.6*    GFR: Estimated Creatinine Clearance: 24.5 mL/min (A) (by C-G formula based on SCr of 1.59 mg/dL (H)).  Liver Function Tests: No results for input(s): AST, ALT, ALKPHOS, BILITOT, PROT, ALBUMIN in the last 168 hours.  Urine analysis:    Component Value Date/Time   COLORURINE STRAW (A) 06/21/2019 0310   APPEARANCEUR CLEAR 06/21/2019 0310   LABSPEC 1.008 06/21/2019 0310   PHURINE 5.0 06/21/2019 0310   GLUCOSEU NEGATIVE 06/21/2019 0310   HGBUR NEGATIVE 06/21/2019 0310   BILIRUBINUR NEGATIVE 06/21/2019 0310   KETONESUR NEGATIVE 06/21/2019 0310   PROTEINUR NEGATIVE 06/21/2019 0310   UROBILINOGEN 0.2 11/05/2014 0437   NITRITE NEGATIVE 06/21/2019 0310   LEUKOCYTESUR NEGATIVE 06/21/2019 0310    Radiological Exams on Admission: DG Chest Port 1 View  Result Date: 11/07/2020 CLINICAL DATA:  Difficulty breathing EXAM: PORTABLE CHEST 1 VIEW COMPARISON:  06/20/2019 FINDINGS: Cardiac shadow is  stable. Aortic calcifications are noted. The lungs are well aerated bilaterally. Old rib fractures are noted on the right with healing. No focal infiltrate is seen. Postsurgical changes in the  lumbar spine and bilateral shoulder joints are seen. IMPRESSION: No acute abnormality noted. Electronically Signed   By: Inez Catalina M.D.   On: 11/07/2020 22:54    EKG: Independently reviewed.  EKG shows normal sinus rhythm with PVCs, LAE, right bundle branch block.  No acute ST elevation or depression.  QTc 465  Assessment/Plan Principal Problem:   COPD with acute exacerbation  Ms. Eiland is admitted to Telemetry floor.  Given solumedrol q 8 hr x 3 doses.  Duonebs as needed.  Will increased phlem production that has turned green she is started on Doxycycline for antibiotic coverage.  Monitor CBC in am  Active Problems:   HTN (hypertension) Continue home medications of norvasc, hydralazine, hyzaar. Monitor BP    Elevated troponin Check serial troponin levels. If continues to increase will need cardiology consult.  Check Echocardiogram in am    CKD (chronic kidney disease) stage 3 B Stable.  Check renal function and electrolytes in am    Weakness generalized Consult PT     DVT prophylaxis: Heparin for DVT prophylaxis.   Code Status:   DNR. Forde Dandy is witnessed by the nurse and patient verifies that she does not wish to be resuscitated Family Communication:  Diagnosis and plan discussed with patient.  Patient verbalized understanding agrees with plan.  Further recommendations to follow as clinical indicated Disposition Plan:   Patient is from:  Home  Anticipated DC to:  Home  Anticipated DC date:  Anticipate 2 midnight or more stay in the hospital  Anticipated DC barriers: No barriers to discharge identified at this time   Admission status:  Inpatient    Yevonne Aline Wandalene Abrams MD [facility]  How to contact the [facility] Attending or Consulting provider [place] or covering  provider during after hours [place], for this patient?   Check the care team in [facility] and look for a) attending/consulting [facility] provider listed and b) the [facility] team listed Log into [email or web address] and use [place] universal password to access. If you do not have the password, please contact the hospital operator. Locate the [facility] provider you are looking for under [facility] and page to a number that you can be directly reached. If you still have difficulty reaching the provider, please page the [facility] (Director on Call) for the Hospitalists listed on amion for assistance.  11/08/2020, 5:37 AM

## 2020-11-08 NOTE — Plan of Care (Signed)
  Problem: Education: Goal: Knowledge of disease or condition will improve Outcome: Progressing Goal: Knowledge of the prescribed therapeutic regimen will improve Outcome: Progressing   Problem: Activity: Goal: Ability to tolerate increased activity will improve Outcome: Progressing   Problem: Respiratory: Goal: Ability to maintain a clear airway will improve Outcome: Progressing Goal: Levels of oxygenation will improve Outcome: Progressing   Problem: Clinical Measurements: Goal: Will remain free from infection Outcome: Progressing

## 2020-11-08 NOTE — ED Notes (Signed)
Patient is resting comfortably. 

## 2020-11-08 NOTE — Evaluation (Signed)
Physical Therapy Evaluation Patient Details Name: [name] MRN: [id number] DOB: [birth year] Today's Date: 11/08/2020   History of Present Illness  75 yo female admitted with COPD exac, weakness. hx of CKD, CHF, breast ca, COPD, chronic pain, RLS  Clinical Impression  On eval, pt required Min A for mobility. She walked ~20 feet x 2 with a RW. Remained on 4L [place] during session-sats 91%. Pt tolerated activity fairly well. Discussed d/c plan-she refuses SNF placement. She plans to d/c back home once medically ready. Will follow and progress activity as tolerated.     Follow Up Recommendations Home health PT;Supervision - Intermittent (pt refuses placement)    Equipment Recommendations  None recommended by PT    Recommendations for Other Services       Precautions / Restrictions Precautions Precautions: Fall Precaution Comments: monitor O2. O2 PRN at baseline Restrictions Weight Bearing Restrictions: No      Mobility  Bed Mobility               General bed mobility comments: oob in recliner    Transfers Overall transfer level: Needs assistance Equipment used: Rolling walker (2 wheeled) Transfers: Sit to/from [facility] Sit to Stand: Min assist Stand pivot transfers: Min assist       General transfer comment: Assist to rise, steady, control descent. Cues for safety.  Ambulation/Gait Ambulation/Gait assistance: Min assist Gait Distance (Feet): 20 Feet (x2) Assistive device: Rolling walker (2 wheeled) Gait Pattern/deviations: Step-through pattern;Decreased stride length;Trunk flexed     General Gait Details: Assist to stabilize throughout distance. Cues for safety. O2 91% on 4L. Seated rest breaks between walks.  Stairs            Wheelchair Mobility    Modified Rankin (Stroke Patients Only)       Balance Overall balance assessment: Needs assistance         Standing balance support: Bilateral upper extremity  supported Standing balance-Leahy Scale: Poor                               Pertinent Vitals/Pain Pain Assessment: Faces Faces Pain Scale: Hurts little more Pain Location: generalized Pain Descriptors / Indicators: Discomfort;Sore Pain Intervention(s): Limited activity within patient's tolerance;Monitored during session;Repositioned    Home Living Family/patient expects to be discharged to:: Private residence Living Arrangements: Alone Available Help at Discharge: Family;Available PRN/intermittently Type of Home: House Home Access: Stairs to enter Entrance Stairs-Rails: Left Entrance Stairs-Number of Steps: 7 Home Layout: One level Home Equipment: Walker - 2 wheels;Grab bars - tub/shower      Prior Function Level of Independence: Independent         Comments: hx of falls. uses RW or cane PRN. Per friend, pt doesn't like to have people in the house to help     Hand Dominance        Extremity/Trunk Assessment   Upper Extremity Assessment Upper Extremity Assessment: Defer to OT evaluation    Lower Extremity Assessment Lower Extremity Assessment: Generalized weakness    Cervical / Trunk Assessment Cervical / Trunk Assessment: Kyphotic  Communication   Communication: No difficulties  Cognition Arousal/Alertness: Awake/alert Behavior During Therapy: WFL for tasks assessed/performed Overall Cognitive Status: Within Functional Limits for tasks assessed  General Comments      Exercises     Assessment/Plan    PT Assessment Patient needs continued PT services  PT Problem List Decreased strength;Decreased mobility;Decreased activity tolerance;Decreased balance;Pain       PT Treatment Interventions DME instruction;Gait training;Therapeutic exercise;Balance training;Therapeutic activities;Functional mobility training;Patient/family education    PT Goals (Current goals can be found in the Care  Plan section)  Acute Rehab PT Goals Patient Stated Goal: to get better and return home to her dogs PT Goal Formulation: With patient Time For Goal Achievement: 11/22/20 Potential to Achieve Goals: Good    Frequency Min 3X/week   Barriers to discharge        Co-evaluation               AM-PAC PT "6 Clicks" Mobility  Outcome Measure Help needed turning from your back to your side while in a flat bed without using bedrails?: A Little Help needed moving from lying on your back to sitting on the side of a flat bed without using bedrails?: A Little Help needed moving to and from a bed to a chair (including a wheelchair)?: A Little Help needed standing up from a chair using your arms (e.g., wheelchair or bedside chair)?: A Little Help needed to walk in hospital room?: A Little Help needed climbing 3-5 steps with a railing? : A Lot 6 Click Score: 17    End of Session Equipment Utilized During Treatment: Gait belt;Oxygen Activity Tolerance: Patient tolerated treatment well Patient left: with call bell/phone within reach (on bsc) Nurse Communication:  (pt requested to sit on bsc and have nursing help her off and back to recliner) PT Visit Diagnosis: Muscle weakness (generalized) (M62.81);Difficulty in walking, not elsewhere classified (R26.2)    Time: 1115-5208 PT Time Calculation (min) (ACUTE ONLY): 34 min   Charges:   PT Evaluation $PT Eval Moderate Complexity: 1 Mod PT Treatments $Gait Training: 8-22 mins      Doreatha Massed, PT Acute Rehabilitation  Office: [phone] Pager: [phone]

## 2020-11-08 NOTE — ED Notes (Signed)
Pt given ginger ale per request and warm blankets.

## 2020-11-08 NOTE — Progress Notes (Signed)
Patient was going to refuse medication for tonight.  Patient stated that she did not feel like the medication was doing her any good.  The NT that knows the patient talked to patient while I was listening to her BS.  Patient was clear and diminished and it had been stated that she had been wheezing most of the time.  I explained to patient that it was working because she was not wheezing at this time.  Will continue to monitor.

## 2020-11-08 NOTE — Progress Notes (Signed)
  Echocardiogram 2D Echocardiogram has been performed.  Randa Lynn Briston Lax 11/08/2020, 12:09 PM

## 2020-11-08 NOTE — Progress Notes (Signed)
Care started prior to midnight in the emergency room and patient was admitted early this morning after midnight by Dr. Harrold Donath and I am in current agreement with his assessment and plan.  Additional changes to the plan of care been made accordingly.  The patient is a 75 year old chronically ill-appearing Caucasian female with a past medical history significant for but not limited to CKD stage III, diastolic CHF, hypertension, breast cancer, COPD as well as other comorbidities who presents for evaluation of progressively worsening shortness of breath.  States that she has been short of breath for a while but is progressively worsened last few days specifically over the last 24 hours.  She does have oxygen at home and uses it intermittently when she needs it.  She tried putting it on but continue to feel short of breath so EMS was called.  Her oxygen requirements increased to 4 L by nasal cannula to ensure that she had a saturation above 92%.  She reports that she has increased coughing with thick green phlegm production last 1 to 2 days.  Normally she has cough with a thin scant clear phlegm.  She states that she has been taking her meds as prescribed.  But did admit to missing a dose of Lasix 40.  She denies use of nebulizer at home and does not like the way it makes her feel.  Denies any recent falls.  Denies any other concerns or complaints at this time and has a history of smoking but she quit about a year ago.  In the ED she is found to be hemodynamically stable however she had an ox requirement of 4 L.  Chest x-ray was done and was negative for infiltrate or consolidation.  Troponin was initially elevated 29 with a second 1 being 42 and COVID-19 was negative.  Currently she is being admitted and treated for the following but not limited to:   Acute Respiratory Failure with Hypoxia Acute Exacerbation of COPD -Is admitted to telemetry floor -Given IV Solu-Medrol 80 mg every 8 hours for 3 doses and  initially was given IV Solu-Medrol 25 mg -Continue with DuoNeb 3 mils every 6 scheduled as well as every 4 as needed wheezing and shortness of breath -She is started on doxycycline for Abx Coverage -We will continue supplemental oxygen via nasal cannula and wean O2 as tolerated -Continuous pulse oximetry maintain O2 saturations greater than 90% -Will add Flutter Valve, Incentive Spirometry, and Guaifenesin 1200 mg po BID  -Continue to monitor respiratory status carefully and if necessary will.  CTA of the chest and possible pulmonary evaluation if she does not improve  Restless leg syndrome -Continue Requip 1 milligram p.o. nightly  HTN (hypertension) -Continue home medications of norvasc, hydralazine, hyzaar.  -Continue to Monitor BP per Protocol  -Last BP was 138/93   Elevated Troponin -Check serial troponin levels. If continues to increase will need cardiology consult.  -Troponin relatively Flat as went from 29 -> 42 -> 40 -> 43 -Check Echocardiogram in am   CKD (chronic kidney disease) stage 3 B -Stable and improving -Patient's BUN/Cr went from 41/1.59 -> 37/1.30 -Avoid further nephrotoxic medications, contrast dyes, hypotension and renally dose medications -Check renal function and electrolytes in am   Generalized Weakness -Consult PT   Hypophosphatemia -Was 1.6 -Replete with IV K Phos 20 mmol -Continue to Monitor and Replete as Necessary -Repeat Phos Level in the AM   Depression and Anxiety -C/w Mirtazapine and Alprazolam  Chronic Pain Syndrome, Chronic Back Pain -  C/w Home Oxycodone Dose  We will continue to monitor the patient's clinical response to intervention and repeat blood work and imaging in the a.m.

## 2020-11-09 ENCOUNTER — Inpatient Hospital Stay (HOSPITAL_COMMUNITY): Payer: Medicare Other

## 2020-11-09 DIAGNOSIS — J441 Chronic obstructive pulmonary disease with (acute) exacerbation: Secondary | ICD-10-CM | POA: Diagnosis not present

## 2020-11-09 DIAGNOSIS — G894 Chronic pain syndrome: Secondary | ICD-10-CM | POA: Diagnosis not present

## 2020-11-09 DIAGNOSIS — I10 Essential (primary) hypertension: Secondary | ICD-10-CM | POA: Diagnosis not present

## 2020-11-09 DIAGNOSIS — N1832 Chronic kidney disease, stage 3b: Secondary | ICD-10-CM | POA: Diagnosis not present

## 2020-11-09 DIAGNOSIS — M961 Postlaminectomy syndrome, not elsewhere classified: Secondary | ICD-10-CM | POA: Diagnosis not present

## 2020-11-09 DIAGNOSIS — M47816 Spondylosis without myelopathy or radiculopathy, lumbar region: Secondary | ICD-10-CM | POA: Diagnosis not present

## 2020-11-09 DIAGNOSIS — Z79891 Long term (current) use of opiate analgesic: Secondary | ICD-10-CM | POA: Diagnosis not present

## 2020-11-09 DIAGNOSIS — R778 Other specified abnormalities of plasma proteins: Secondary | ICD-10-CM | POA: Diagnosis not present

## 2020-11-09 LAB — CBC WITH DIFFERENTIAL/PLATELET
Abs Immature Granulocytes: 0.04 10*3/uL (ref 0.00–0.07)
Basophils Absolute: 0 10*3/uL (ref 0.0–0.1)
Basophils Relative: 0 %
Eosinophils Absolute: 0 10*3/uL (ref 0.0–0.5)
Eosinophils Relative: 0 %
HCT: 41.9 % (ref 36.0–46.0)
Hemoglobin: 14 g/dL (ref 12.0–15.0)
Immature Granulocytes: 1 %
Lymphocytes Relative: 6 %
Lymphs Abs: 0.4 10*3/uL — ABNORMAL LOW (ref 0.7–4.0)
MCH: 31.1 pg (ref 26.0–34.0)
MCHC: 33.4 g/dL (ref 30.0–36.0)
MCV: 93.1 fL (ref 80.0–100.0)
Monocytes Absolute: 0.2 10*3/uL (ref 0.1–1.0)
Monocytes Relative: 2 %
Neutro Abs: 7.3 10*3/uL (ref 1.7–7.7)
Neutrophils Relative %: 91 %
Platelets: 203 10*3/uL (ref 150–400)
RBC: 4.5 MIL/uL (ref 3.87–5.11)
RDW: 13 % (ref 11.5–15.5)
WBC: 8 10*3/uL (ref 4.0–10.5)
nRBC: 0 % (ref 0.0–0.2)

## 2020-11-09 LAB — COMPREHENSIVE METABOLIC PANEL
ALT: 20 U/L (ref 0–44)
AST: 33 U/L (ref 15–41)
Albumin: 3.4 g/dL — ABNORMAL LOW (ref 3.5–5.0)
Alkaline Phosphatase: 96 U/L (ref 38–126)
Anion gap: 9 (ref 5–15)
BUN: 49 mg/dL — ABNORMAL HIGH (ref 8–23)
CO2: 33 mmol/L — ABNORMAL HIGH (ref 22–32)
Calcium: 10 mg/dL (ref 8.9–10.3)
Chloride: 95 mmol/L — ABNORMAL LOW (ref 98–111)
Creatinine, Ser: 1.59 mg/dL — ABNORMAL HIGH (ref 0.44–1.00)
GFR, Estimated: 34 mL/min — ABNORMAL LOW (ref >60)
Glucose, Bld: 153 mg/dL — ABNORMAL HIGH (ref 70–99)
Potassium: 4.7 mmol/L (ref 3.5–5.1)
Sodium: 137 mmol/L (ref 135–145)
Total Bilirubin: 0.8 mg/dL (ref 0.3–1.2)
Total Protein: 6.6 g/dL (ref 6.5–8.1)

## 2020-11-09 LAB — PHOSPHORUS: Phosphorus: 3.4 mg/dL (ref 2.5–4.6)

## 2020-11-09 LAB — MAGNESIUM: Magnesium: 2.2 mg/dL (ref 1.7–2.4)

## 2020-11-09 MED ORDER — PREDNISONE 10 MG (21) PO TBPK: ORAL_TABLET | ORAL | 0 refills | Status: DC

## 2020-11-09 MED ORDER — GUAIFENESIN ER 600 MG PO TB12: 600.0000 mg | ORAL_TABLET | Freq: Two times a day (BID) | ORAL | 0 refills | Status: AC

## 2020-11-09 MED ORDER — ONDANSETRON HCL 4 MG PO TABS: 4.0000 mg | ORAL_TABLET | Freq: Four times a day (QID) | ORAL | 0 refills | Status: DC | PRN

## 2020-11-09 MED ORDER — DOXYCYCLINE HYCLATE 100 MG PO TABS: 100.0000 mg | ORAL_TABLET | Freq: Two times a day (BID) | ORAL | 0 refills | Status: AC

## 2020-11-09 MED ORDER — ALPRAZOLAM 1 MG PO TABS: 1.0000 mg | ORAL_TABLET | Freq: Two times a day (BID) | ORAL | 0 refills | Status: DC | PRN

## 2020-11-09 MED ORDER — ACETAMINOPHEN 325 MG PO TABS: 650.0000 mg | ORAL_TABLET | Freq: Four times a day (QID) | ORAL | 0 refills | Status: DC | PRN

## 2020-11-09 NOTE — TOC Transition Note (Signed)
Transition of Care [facility]) - CM/SW Discharge Note   Patient Details  Name: [name] S [place] MRN: [id number] Date of Birth: 1945-12-08  [facility] CM/SW Contact:  Dessa Phi, RN Phone Number: 11/09/2020, 3:22 PM   Clinical Narrative:  d/c home w/HHPT-[facility] rep Amy accepted. [employer] rep Thedore Mins has delivered home 02 to rm to accomodate continuous home 02. No further CM needs.     Final next level of care: [place] Barriers to Discharge: No Barriers Identified   Patient Goals and CMS Choice Patient states their goals for this hospitalization and ongoing recovery are:: go home CMS Medicare.gov Compare Post Acute Care list provided to:: Patient Choice offered to / list presented to : Patient  Discharge Placement                       Discharge Plan and Services   Discharge Planning Services: CM Consult            DME Arranged: Oxygen DME Agency: [employer] Date DME Agency Contacted: 11/09/20 Time DME Agency Contacted: [phone] Representative spoke with at DME Agency: [place]: PT [place]: [place] Date [place]: 11/09/20 Time [place]: 63 Representative spoke with at [place]: amy  Social Determinants of Health ([place]) Interventions     Readmission Risk Interventions No flowsheet data found.

## 2020-11-09 NOTE — Evaluation (Signed)
Occupational Therapy Evaluation Patient Details Name: [name] MRN: [id number] DOB: [birth year] Today's Date: 11/09/2020    History of Present Illness 75 yo female admitted with COPD exac, weakness. hx of CKD, CHF, breast ca, COPD, chronic pain, RLS   Clinical Impression   Patient lives home alone in a single level house, is independent at baseline with self care tasks and uses O2 as needed. Currently patient demonstrate no physical assistance needed for donning socks, transfer on/off toilet, sink side grooming/hygiene and ambulating to recliner. O2 sats did drop to 78% on room air, educate patient on need for O2 with activity and pursed lip breathing, donned O2 once back in chair. No further acute OT needs, patient reports asking nursing to get up to bathroom vs using pure wick. Encouraged this as well as up to chair for meals. Please re-consult if new needs arise, thank you.     Follow Up Recommendations  No OT follow up    Equipment Recommendations  None recommended by OT       Precautions / Restrictions Precautions Precaution Comments: monitor O2. O2 PRN at baseline Restrictions Weight Bearing Restrictions: No      Mobility Bed Mobility Overal bed mobility: Modified Independent                  Transfers Overall transfer level: Modified independent Equipment used: None             General transfer comment: patient able to ambulate to/from bathroom and transfer into recliner without physical assistance    Balance Overall balance assessment: Mild deficits observed, not formally tested                                         ADL either performed or assessed with clinical judgement   ADL Overall ADL's : Modified independent                                       General ADL Comments: patient ambulate to/from bathroom, perform peri care + clothing management, hand hygiene and brushing hair at sink without any physical  assistance. Patient does drop to 78% on room air with activity therefore O2 donned and educate patient on need for O2. does have at home which she states she uses "as needed"      Pertinent Vitals/Pain Pain Assessment: Faces Faces Pain Scale: Hurts little more Pain Location: headache Pain Descriptors / Indicators: Headache Pain Intervention(s): Premedicated before session     Hand Dominance Right   Extremity/Trunk Assessment Upper Extremity Assessment Upper Extremity Assessment: Overall WFL for tasks assessed   Lower Extremity Assessment Lower Extremity Assessment: Defer to PT evaluation   Cervical / Trunk Assessment Cervical / Trunk Assessment: Kyphotic   Communication Communication Communication: No difficulties   Cognition Arousal/Alertness: Awake/alert Behavior During Therapy: WFL for tasks assessed/performed Overall Cognitive Status: Within Functional Limits for tasks assessed                                     General Comments  desat to 78% on room air, cues for pursed lip breathing and donned 4L O2 once seated in recliner            Home Living  Family/patient expects to be discharged to:: Private residence Living Arrangements: Alone Available Help at Discharge: Family;Available PRN/intermittently Type of Home: House Home Access: Stairs to enter [employer] of Steps: 7 Entrance Stairs-Rails: Left Home Layout: One level     Bathroom Shower/Tub: Teacher, early years/pre: Handicapped height     Home Equipment: Environmental consultant - 2 wheels;Grab bars - tub/shower;Shower seat;Cane - single point          Prior Functioning/Environment Level of Independence: Independent        Comments: hx of falls. uses RW or cane PRN. Per friend, pt doesn't like to have people in the house to help        OT Problem List: Decreased activity tolerance;Cardiopulmonary status limiting activity         OT Goals(Current goals can be found in  the care plan section) Acute Rehab OT Goals Patient Stated Goal: to get better and return home to her dogs OT Goal Formulation: All assessment and education complete, DC therapy   AM-PAC OT "6 Clicks" Daily Activity     Outcome Measure Help from another person eating meals?: None Help from another person taking care of personal grooming?: None Help from another person toileting, which includes using toliet, bedpan, or urinal?: None Help from another person bathing (including washing, rinsing, drying)?: None Help from another person to put on and taking off regular upper body clothing?: None Help from another person to put on and taking off regular lower body clothing?: None 6 Click Score: 24   End of Session Equipment Utilized During Treatment: Oxygen Nurse Communication: Mobility status  Activity Tolerance: Patient tolerated treatment well Patient left: in chair;with call bell/phone within reach  OT Visit Diagnosis: Other abnormalities of gait and mobility (R26.89)                Time: 1660-6301 OT Time Calculation (min): 29 min Charges:  OT General Charges $OT Visit: 1 Visit OT Evaluation $OT Eval Low Complexity: 1 Low OT Treatments $Self Care/Home Management : 8-22 mins  Delbert Phenix OT OT pager: [place] 11/09/2020, 9:30 AM

## 2020-11-09 NOTE — Progress Notes (Addendum)
1:10 PM   Pt O2 saturation on 3L [place] at rest 97%  Pt O2 saturation on room air at rest 85%  Pt placed on 3L [place] and recovered Oxygen saturation > 90%.

## 2020-11-09 NOTE — Discharge Summary (Signed)
Physician Discharge Summary  Holly S Slayton LGX:[id number] DOB: 1945/10/18 DOA: 11/07/2020  PCP: Maude Leriche, PA-C  Admit date: 11/07/2020 Discharge date: 11/09/2020  Admitted From: Home Disposition: Home   Recommendations for Outpatient Follow-up:  Follow up with PCP in 1-2 weeks Follow up with Cardiology within 1-2 weeks Follow up with Pulmonary on 12/02/20  Please obtain CMP/CBC, Mag, Phos in one week Please follow up on the following pending results:  Home Health: No Equipment/Devices: None  Discharge Condition: Stable CODE STATUS: FULL CODE Diet recommendation: Heart Healthy Diet   Brief/Interim Summary: The patient is a 75 year old chronically ill-appearing Caucasian female with a past medical history significant for but not limited to CKD stage III, diastolic CHF, hypertension, breast cancer, COPD as well as other comorbidities who presents for evaluation of progressively worsening shortness of breath.  States that she has been short of breath for a while but is progressively worsened last few days specifically over the last 24 hours.  She does have oxygen at home and uses it intermittently when she needs it.  She tried putting it on but continue to feel short of breath so EMS was called.  Her oxygen requirements increased to 4 L by nasal cannula to ensure that she had a saturation above 92%.  She reports that she has increased coughing with thick green phlegm production last 1 to 2 days.  Normally she has cough with a thin scant clear phlegm.  She states that she has been taking her meds as prescribed.  But did admit to missing a dose of Lasix 40.  She denies use of nebulizer at home and does not like the way it makes her feel.  Denies any recent falls.  Denies any other concerns or complaints at this time and has a history of smoking but she quit about a year ago.  In the ED she is found to be hemodynamically stable however she had an ox requirement of 4 L.  Chest x-ray was done  and was negative for infiltrate or consolidation.  Troponin was initially elevated 29 with a second 1 being 42 and COVID-19 was negative. Her respiratory Status improved significantly with treatment but prior to Discharge she was tested for Home O2 and desaturated. She has Home O2 at home so now it will be continuous. She was discharged on a Steroid taper and po Doxy. She will be set up with a Pulmonologist for further evaluation of her COPD and appointment is scheduled for 7/14.   Discharge Diagnoses:  Principal Problem:   COPD with acute exacerbation ([place]) Active Problems:   Weakness generalized   HTN (hypertension)   Elevated troponin   CKD (chronic kidney disease) stage 3, GFR 30-59 ml/min (HCC)  Acute on Chronic Respiratory Failure with Hypoxia Acute Exacerbation of COPD -Is admitted to telemetry floor -Given IV Solu-Medrol 80 mg every 8 hours for 3 doses and initially was given IV Solu-Medrol 125 mg; Change to po Pred Taper and continue Empiric Doxy at D/C  -Continue with DuoNeb 3 mils every 6 scheduled as well as every 4 as needed wheezing and shortness of breath; Does not want Nebs as she feels "dizzy" -She is started on doxycycline for Abx Coverage -We will continue supplemental oxygen via nasal cannula and wean O2 as tolerated -Continuous pulse oximetry maintain O2 saturations greater than 90% -Will add Flutter Valve, Incentive Spirometry, and Guaifenesin 1200 mg po BID -Continue to monitor respiratory status carefully and if necessary will.  CTA of the chest and  possible pulmonary evaluation if she does not improve but she did improve -Repeat CXR showed "Stable chronic interstitial prominence. Low lung volumes with mild bibasilar atelectasis. No acute infiltrate. Heart size normal." -She will follow up with PCP and Pulmonary but she did desaturate so will go on Continuous O2 now    Restless leg syndrome -Continue Requip 1 milligram p.o. nightly  HTN (hypertension) -Continue  home medications of norvasc, hydralazine, hyzaar.  -Continue to Monitor BP per Protocol -Last BP was 130/82   Elevated Troponin -Check serial troponin levels. If continues to increase will need cardiology consult.  -Troponin relatively Flat as went from 29 -> 42 -> 40 -> 43 -Checked Echocardiogram and showed "  1. Left ventricular ejection fraction, by estimation, is 65 to 70%. The  left ventricle has normal function. The left ventricle has no regional  wall motion abnormalities. Left ventricular diastolic parameters are  consistent with Grade I diastolic  dysfunction (impaired relaxation).   2. Right ventricular systolic function is normal. The right ventricular  size is normal. There is normal pulmonary artery systolic pressure.   3. Left atrial size was mildly dilated.   4. The mitral valve is grossly normal. Trivial mitral valve  regurgitation.   5. The aortic valve is tricuspid. Aortic valve regurgitation is not  visualized.   6. The inferior vena cava is normal in size with greater than 50%  respiratory variability, suggesting right atrial pressure of 3 mmHg.   7. Small PFO suspected. " -Follow up with Dr. 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 -Stable and improving -Patient's BUN/Cr went from 41/1.59 -> 37/1.30 -> 49/1.59 -Avoid further nephrotoxic medications, contrast dyes, hypotension and renally dose medications -Check renal function and electrolytes in am   Generalized Weakness -Consult PT and recommending no follow up   Hypophosphatemia -Was 1.6 and improved to 3.4 -Replete with IV K Phos 20 mmol yesterday  -Continue to Monitor and Replete as Necessary -Repeat Phos Level in the AM   Depression and Anxiety -C/w Mirtazapine and Alprazolam   Chronic Pain Syndrome, Chronic Back Pain -C/w Home Oxycodone Dose   Discharge Instructions  Discharge Instructions     Call MD for:  difficulty breathing, headache or visual disturbances    Complete by: As directed    Call MD for:  extreme fatigue   Complete by: As directed    Call MD for:  hives   Complete by: As directed    Call MD for:  persistant dizziness or light-headedness   Complete by: As directed    Call MD for:  persistant nausea and vomiting   Complete by: As directed    Call MD for:  redness, tenderness, or signs of infection (pain, swelling, redness, odor or green/yellow discharge around incision site)   Complete by: As directed    Call MD for:  severe uncontrolled pain   Complete by: As directed    Call MD for:  temperature >100.4   Complete by: As directed    Diet - low sodium heart healthy   Complete by: As directed    Diet - low sodium heart healthy   Complete by: As directed    Discharge instructions   Complete by: As directed    You were cared for by a hospitalist during your hospital stay. If you have any questions about your discharge medications or the care you received while you were in the hospital after you are discharged, you can  call the unit and ask to speak with the hospitalist on call if the hospitalist that took care of you is not available. Once you are discharged, your primary care physician will handle any further medical issues. Please note that NO REFILLS for any discharge medications will be authorized once you are discharged, as it is imperative that you return to your primary care physician (or establish a relationship with a primary care physician if you do not have one) for your aftercare needs so that they can reassess your need for medications and monitor your lab values.  Follow up with PCP, Cardiology and Pulmonary within 1-2 weeks. Take all medications as prescribed. If symptoms change or worsen please return to the ED for evaluation   Discharge instructions   Complete by: As directed    You were cared for by a hospitalist during your hospital stay. If you have any questions about your discharge medications or the care you  received while you were in the hospital after you are discharged, you can call the unit and ask to speak with the hospitalist on call if the hospitalist that took care of you is not available. Once you are discharged, your primary care physician will handle any further medical issues. Please note that NO REFILLS for any discharge medications will be authorized once you are discharged, as it is imperative that you return to your primary care physician (or establish a relationship with a primary care physician if you do not have one) for your aftercare needs so that they can reassess your need for medications and monitor your lab values.  Follow up with PCP, Cardiology, and Pulmonary in the outpatient setting. Take all medications as prescribed. If symptoms change or worsen please return to the ED for evaluation   Increase activity slowly   Complete by: As directed    Increase activity slowly   Complete by: As directed       Allergies as of 11/09/2020   No Known Allergies      Medication List     TAKE these medications    acetaminophen 325 MG tablet Commonly known as: TYLENOL Take 2 tablets (650 mg total) by mouth every 6 (six) hours as needed for mild pain (or Fever >/= 101).   ALPRAZolam 1 MG tablet Commonly known as: XANAX Take 1 tablet (1 mg total) by mouth 2 (two) times daily as needed. What changed:  how much to take how to take this when to take this   amLODipine 5 MG tablet Commonly known as: NORVASC TAKE 1 TABLET BY MOUTH EVERY DAY   aspirin EC 81 MG tablet Take 81 mg by mouth daily. Swallow whole.   atorvastatin 10 MG tablet Commonly known as: LIPITOR TAKE 1 TABLET BY MOUTH EVERY DAY   buPROPion 150 MG 24 hr tablet Commonly known as: WELLBUTRIN XL Take 150 mg by mouth daily.   doxycycline 100 MG tablet Commonly known as: VIBRA-TABS Take 1 tablet (100 mg total) by mouth every 12 (twelve) hours for 4 days.   furosemide 20 MG tablet Commonly known as:  LASIX TAKE 1 TABLET BY MOUTH TWICE A DAY What changed:  when to take this additional instructions   gabapentin 100 MG capsule Commonly known as: NEURONTIN Take 100 mg by mouth 3 (three) times daily.   guaiFENesin 600 MG 12 hr tablet Commonly known as: MUCINEX Take 1 tablet (600 mg total) by mouth 2 (two) times daily for 5 days.   hydrALAZINE 25 MG tablet  Commonly known as: APRESOLINE TAKE 1 TABLET BY MOUTH THREE TIMES A DAY   losartan-hydrochlorothiazide 100-12.5 MG tablet Commonly known as: HYZAAR TAKE 1 TABLET BY MOUTH EVERY DAY   mirtazapine 30 MG tablet Commonly known as: REMERON 30 mg daily.   multivitamin with minerals Tabs tablet Take 1 tablet by mouth daily.   ondansetron 4 MG tablet Commonly known as: ZOFRAN Take 1 tablet (4 mg total) by mouth every 6 (six) hours as needed for nausea.   oxyCODONE 60 MG 12 hr tablet Commonly known as: OXYCONTIN Take 1 tablet by mouth every 8 (eight) hours.   potassium chloride 10 MEQ tablet Commonly known as: KLOR-CON TAKE 1 TABLET (10 MEQ TOTAL) BY MOUTH DAILY. TAKE WITH LASIX   predniSONE 10 MG (21) Tbpk tablet Commonly known as: STERAPRED UNI-PAK 21 TAB Take 6 tabs (60 mg) on day 1, 5 tabs (50 mg) on day 2, 4 tabs (40 mg) on day 3, 3 tabs (30 mg) on Day 4, 2 tabs (20 mg) Day 5, and 1 Tab (10 mg) on Day 6 and stop Day 7   rOPINIRole 1 MG tablet Commonly known as: REQUIP Take 1 mg by mouth at bedtime.        Follow-up Information     Scifres, Earlie Server, [place]. Call.   Specialty: Physician Assistant Why: FOllow up within 1-2 weeks Contact information: [place] [address] [phone]         Adrian Prows, MD Follow up.   Specialty: Cardiology Why: Follow up within 1-2 weeks Contact information: [place] [address] [phone]         [facility]. Go on 12/02/2020.   Specialty: Pulmonology Why: FOllow up for Hospital Discharge and COPD  evaluation on 12/02/20 at 11:30 AM Contact information: [place] [address] [place], [facility] Follow up.   Specialty: Home Health Services Why: [facility] physical therapy Contact information: [place] [address] [phone]                No Known Allergies  Consultations: None  Procedures/Studies: DG CHEST PORT 1 VIEW  Result Date: 11/09/2020 CLINICAL DATA:  Shortness of breath. EXAM: PORTABLE CHEST 1 VIEW COMPARISON:  11/07/2020.  04/13/2015. FINDINGS: Mediastinum and hilar structures normal. Heart size normal. Stable chronic interstitial prominence. Low lung volumes with mild bibasilar atelectasis. No acute infiltrate. No pleural effusion or pneumothorax. Old right rib fractures again noted. Bilateral shoulder replacements again noted. Degenerative changes scoliosis thoracic spine. IMPRESSION: Stable chronic interstitial prominence. Low lung volumes with mild bibasilar atelectasis. No acute infiltrate. Heart size normal. Electronically Signed   By: Marcello Moores  Register   On: 11/09/2020 09:05   DG Chest Port 1 View  Result Date: 11/07/2020 CLINICAL DATA:  Difficulty breathing EXAM: PORTABLE CHEST 1 VIEW COMPARISON:  06/20/2019 FINDINGS: Cardiac shadow is stable. Aortic calcifications are noted. The lungs are well aerated bilaterally. Old rib fractures are noted on the right with healing. No focal infiltrate is seen. Postsurgical changes in the lumbar spine and bilateral shoulder joints are seen. IMPRESSION: No acute abnormality noted. Electronically Signed   By: Inez Catalina M.D.   On: 11/07/2020 22:54   ECHOCARDIOGRAM COMPLETE  Result Date: 11/08/2020    ECHOCARDIOGRAM REPORT   Patient Name:   [name] Date of Exam: 11/08/2020 Medical Rec #:  [id number]  Height:       60.0 in Accession #:    [id number]            Weight:       125.0 lb Date of Birth:  03-20-1946              BSA:          1.529 m Patient Age:    51 years              BP:           138/93 mmHg Patient Gender: F                     HR:           98 bpm. Exam Location:  Inpatient Procedure: 2D Echo, Cardiac Doppler and Color Doppler Indications:    Elevated Troponin  History:        Patient has prior history of Echocardiogram examinations, most                 recent 11/26/2018. Signs/Symptoms:Dyspnea; Risk                 Factors:Hypertension and Former Smoker. CKD. Cancer.  Sonographer:    Jonelle Sidle Dance Referring Phys: [id number] [place]  1. Left ventricular ejection fraction, by estimation, is 65 to 70%. The left ventricle has normal function. The left ventricle has no regional wall motion abnormalities. Left ventricular diastolic parameters are consistent with Grade I diastolic dysfunction (impaired relaxation).  2. Right ventricular systolic function is normal. The right ventricular size is normal. There is normal pulmonary artery systolic pressure.  3. Left atrial size was mildly dilated.  4. The mitral valve is grossly normal. Trivial mitral valve regurgitation.  5. The aortic valve is tricuspid. Aortic valve regurgitation is not visualized.  6. The inferior vena cava is normal in size with greater than 50% respiratory variability, suggesting right atrial pressure of 3 mmHg.  7. Small PFO suspected. FINDINGS  Left Ventricle: Left ventricular ejection fraction, by estimation, is 65 to 70%. The left ventricle has normal function. The left ventricle has no regional wall motion abnormalities. The left ventricular internal cavity size was normal in size. There is  no left ventricular hypertrophy. Left ventricular diastolic parameters are consistent with Grade I diastolic dysfunction (impaired relaxation). Indeterminate filling pressures. Right Ventricle: The right ventricular size is normal. No increase in right ventricular wall thickness. Right ventricular systolic function is normal. There is normal  pulmonary artery systolic pressure. The tricuspid regurgitant velocity is 2.48 m/s, and  with an assumed right atrial pressure of 3 mmHg, the estimated right ventricular systolic pressure is 56.3 mmHg. Left Atrium: Left atrial size was mildly dilated. Right Atrium: Right atrial size was normal in size. Pericardium: There is no evidence of pericardial effusion. Mitral Valve: The mitral valve is grossly normal. Trivial mitral valve regurgitation. Tricuspid Valve: The tricuspid valve is grossly normal. Tricuspid valve regurgitation is trivial. Aortic Valve: The aortic valve is tricuspid. Aortic valve regurgitation is not visualized. Pulmonic Valve: The pulmonic valve was normal in structure. Pulmonic valve regurgitation is not visualized. Aorta: The aortic root and ascending aorta are structurally normal, with no evidence of dilitation. Venous: The inferior vena cava is normal in size with greater than 50% respiratory variability, suggesting right atrial pressure of 3 mmHg. IAS/Shunts: The interatrial septum is aneurysmal. Small PFO suspected.  LEFT VENTRICLE PLAX 2D LVIDd:  3.90 cm  Diastology LVIDs:         2.40 cm  LV e' medial:    9.36 cm/s LV PW:         1.00 cm  LV E/e' medial:  9.2 LV IVS:        0.80 cm  LV e' lateral:   10.60 cm/s LVOT diam:     2.00 cm  LV E/e' lateral: 8.1 LV SV:         78 LV SV Index:   51 LVOT Area:     3.14 cm  RIGHT VENTRICLE             IVC RV Basal diam:  3.30 cm     IVC diam: 1.30 cm RV Mid diam:    2.40 cm RV S prime:     13.10 cm/s TAPSE (M-mode): 2.0 cm LEFT ATRIUM             Index       RIGHT ATRIUM           Index LA diam:        4.40 cm 2.88 cm/m  RA Area:     13.70 cm LA Vol (A2C):   69.6 ml 45.53 ml/m RA Volume:   33.80 ml  22.11 ml/m LA Vol (A4C):   46.2 ml 30.22 ml/m LA Biplane Vol: 57.0 ml 37.29 ml/m  AORTIC VALVE LVOT Vmax:   125.50 cm/s LVOT Vmean:  75.600 cm/s LVOT VTI:    0.248 m  AORTA Ao Root diam: 3.40 cm Ao Asc diam:  3.00 cm MITRAL VALVE                 TRICUSPID VALVE MV Area (PHT): 4.31 cm     TR Peak grad:   24.6 mmHg MV Decel Time: 176 msec     TR Vmax:        248.00 cm/s MV E velocity: 86.30 cm/s MV A velocity: 118.00 cm/s  SHUNTS MV E/A ratio:  0.73         Systemic VTI:  0.25 m                             Systemic Diam: 2.00 cm Lyman Bishop MD Electronically signed by Lyman Bishop MD Signature Date/Time: 11/08/2020/2:32:52 PM    Final      Subjective: Seen and examined and felt tremendously better. No more wheezing or SOB. Desaturated on Rest and Ambulatory Home O2 Screen. Has O2 at Home but will need it continuous. Appointment set up with Pulmonary. No other concerns or complaints and ready to go home.  Discharge Exam: Vitals:   11/09/20 0740 11/09/20 1214  BP:  130/82  Pulse:  71  Resp:  16  Temp:  97.8 F (36.6 C)  SpO2: 100% 94%   Vitals:   11/08/20 2034 11/09/20 0408 11/09/20 0740 11/09/20 1214  BP: 134/78 (!) 144/79  130/82  Pulse: 90 77  71  Resp: 20 20  16   Temp: 98 F (36.7 C) 97.7 F (36.5 C)  97.8 F (36.6 C)  TempSrc:    Axillary  SpO2: 95% 100% 100% 94%  Weight:      Height:       General: Pt is alert, awake, not in acute distress Cardiovascular: RRR, S1/S2 +, no rubs, no gallops Respiratory: Diminished bilaterally, no wheezing, no rhonchi; Wearing Supplemental O2 via [place] Abdominal: Soft, NT, ND, bowel sounds +  Extremities: No appreciable edema, no cyanosis  The results of significant diagnostics from this hospitalization (including imaging, microbiology, ancillary and laboratory) are listed below for reference.    Microbiology: Recent Results (from the past 240 hour(s))  SARS CORONAVIRUS 2 (TAT 6-24 HRS) Nasopharyngeal Nasopharyngeal Swab     Status: None   Collection Time: 11/08/20  3:17 AM   Specimen: Nasopharyngeal Swab  Result Value Ref Range Status   SARS Coronavirus 2 NEGATIVE NEGATIVE Final    Comment: (NOTE) SARS-CoV-2 target nucleic acids are NOT DETECTED.  The SARS-CoV-2 RNA is  generally detectable in upper and lower respiratory specimens during the acute phase of infection. Negative results do not preclude SARS-CoV-2 infection, do not rule out co-infections with other pathogens, and should not be used as the sole basis for treatment or other patient management decisions. Negative results must be combined with clinical observations, patient history, and epidemiological information. The expected result is Negative.  Fact Sheet for Patients: SugarRoll.be  Fact Sheet for Healthcare Providers: https://www.woods-mathews.com/  This test is not yet approved or cleared by the [place] FDA and  has been authorized for detection and/or diagnosis of SARS-CoV-2 by FDA under an Emergency Use Authorization (EUA). This EUA will remain  in effect (meaning this test can be used) for the duration of the COVID-19 declaration under Se ction 564(b)(1) of the Act, 21 U.S.C. section 360bbb-3(b)(1), unless the authorization is terminated or revoked sooner.  Performed at [facility], [place] [address] [place], [place] [address]     Labs: BNP (last 3 results) Recent Labs    11/07/20 2250  BNP 371.0*   Basic Metabolic Panel: Recent Labs  Lab 11/07/20 2250 11/08/20 1010 11/09/20 0458  NA 144 139 137  K 3.7 4.3 4.7  CL 98 98 95*  CO2 33* 33* 33*  GLUCOSE 148* 156* 153*  BUN 41* 37* 49*  CREATININE 1.59* 1.30* 1.59*  CALCIUM 11.0* 10.5* 10.0  MG 2.6* 2.3 2.2  PHOS  --  1.6* 3.4   Liver Function Tests: Recent Labs  Lab 11/08/20 1010 11/09/20 0458  AST 25 33  ALT 20 20  ALKPHOS 107 96  BILITOT 0.5 0.8  PROT 7.3 6.6  ALBUMIN 3.9 3.4*   No results for input(s): LIPASE, AMYLASE in the last 168 hours. No results for input(s): AMMONIA in the last 168 hours. CBC: Recent Labs  Lab 11/07/20 2250 11/08/20 1010 11/09/20 0458  WBC 4.5 3.8* 8.0  NEUTROABS  --  3.3 7.3  HGB 14.9 13.6 14.0  HCT 46.3* 41.1 41.9   MCV 95.9 93.6 93.1  PLT 202 192 203   Cardiac Enzymes: No results for input(s): CKTOTAL, CKMB, CKMBINDEX, TROPONINI in the last 168 hours. BNP: Invalid input(s): POCBNP CBG: No results for input(s): GLUCAP in the last 168 hours. D-Dimer No results for input(s): DDIMER in the last 72 hours. Hgb A1c No results for input(s): HGBA1C in the last 72 hours. Lipid Profile No results for input(s): CHOL, HDL, LDLCALC, TRIG, CHOLHDL, LDLDIRECT in the last 72 hours. Thyroid function studies No results for input(s): TSH, T4TOTAL, T3FREE, THYROIDAB in the last 72 hours.  Invalid input(s): FREET3 Anemia work up No results for input(s): VITAMINB12, FOLATE, FERRITIN, TIBC, IRON, RETICCTPCT in the last 72 hours. Urinalysis    Component Value Date/Time   COLORURINE STRAW (A) 06/21/2019 0310   APPEARANCEUR CLEAR 06/21/2019 0310   LABSPEC 1.008 06/21/2019 0310   PHURINE 5.0 06/21/2019 0310   GLUCOSEU NEGATIVE 06/21/2019 0310  HGBUR NEGATIVE 06/21/2019 0310   BILIRUBINUR NEGATIVE 06/21/2019 0310   KETONESUR NEGATIVE 06/21/2019 0310   PROTEINUR NEGATIVE 06/21/2019 0310   UROBILINOGEN 0.2 11/05/2014 0437   NITRITE NEGATIVE 06/21/2019 0310   LEUKOCYTESUR NEGATIVE 06/21/2019 0310   Sepsis Labs Invalid input(s): PROCALCITONIN,  WBC,  LACTICIDVEN Microbiology Recent Results (from the past 240 hour(s))  SARS CORONAVIRUS 2 (TAT 6-24 HRS) Nasopharyngeal Nasopharyngeal Swab     Status: None   Collection Time: 11/08/20  3:17 AM   Specimen: Nasopharyngeal Swab  Result Value Ref Range Status   SARS Coronavirus 2 NEGATIVE NEGATIVE Final    Comment: (NOTE) SARS-CoV-2 target nucleic acids are NOT DETECTED.  The SARS-CoV-2 RNA is generally detectable in upper and lower respiratory specimens during the acute phase of infection. Negative results do not preclude SARS-CoV-2 infection, do not rule out co-infections with other pathogens, and should not be used as the sole basis for treatment or other  patient management decisions. Negative results must be combined with clinical observations, patient history, and epidemiological information. The expected result is Negative.  Fact Sheet for Patients: SugarRoll.be  Fact Sheet for Healthcare Providers: https://www.woods-mathews.com/  This test is not yet approved or cleared by the [place] FDA and  has been authorized for detection and/or diagnosis of SARS-CoV-2 by FDA under an Emergency Use Authorization (EUA). This EUA will remain  in effect (meaning this test can be used) for the duration of the COVID-19 declaration under Se ction 564(b)(1) of the Act, 21 U.S.C. section 360bbb-3(b)(1), unless the authorization is terminated or revoked sooner.  Performed at [facility], [place] [address], [place], [place] [address]    Time coordinating discharge: 35 minutes  SIGNED:  Kerney Elbe, DO [facility] 11/09/2020, 10:23 PM Pager is on [place]  If 7PM-7AM, please contact night-coverage [email or web address]

## 2020-11-09 NOTE — Consult Note (Signed)
[facility] Inpatient Consult   11/09/2020  [name] S Roher [birth year] [id number]  [place] [facility] [ACO] Patient: [employer]   Patient screened for hospitalization with noted extreme high risk score for unplanned readmission, 34%, assessing for potential [place] Management service needs for post hospital transition.  Plan: Per review, current disposition plan is for home with home health PT services. Patient will receive EMMI discharge calls.  Of note, [facility] Management services does not replace or interfere with any services that are arranged by inpatient case management or social work.   Netta Cedars, MSN, [facility] Liaison Nurse Mobile Phone [phone]  Toll free office [phone]

## 2020-11-11 ENCOUNTER — Telehealth: Payer: Self-pay

## 2020-11-12 DIAGNOSIS — J432 Centrilobular emphysema: Secondary | ICD-10-CM | POA: Diagnosis not present

## 2020-11-16 ENCOUNTER — Other Ambulatory Visit: Payer: Self-pay

## 2020-11-16 ENCOUNTER — Encounter: Payer: Self-pay | Admitting: Cardiology

## 2020-11-16 ENCOUNTER — Ambulatory Visit: Payer: Medicare Other | Admitting: Cardiology

## 2020-11-16 VITALS — BP 133/65 | HR 75 | Temp 98.7°F | Resp 16 | Ht 60.0 in | Wt 119.6 lb

## 2020-11-16 DIAGNOSIS — I10 Essential (primary) hypertension: Secondary | ICD-10-CM | POA: Diagnosis not present

## 2020-11-16 DIAGNOSIS — J9611 Chronic respiratory failure with hypoxia: Secondary | ICD-10-CM | POA: Diagnosis not present

## 2020-11-16 DIAGNOSIS — I739 Peripheral vascular disease, unspecified: Secondary | ICD-10-CM

## 2020-11-16 DIAGNOSIS — R0609 Other forms of dyspnea: Secondary | ICD-10-CM

## 2020-11-16 DIAGNOSIS — J441 Chronic obstructive pulmonary disease with (acute) exacerbation: Secondary | ICD-10-CM | POA: Diagnosis not present

## 2020-11-16 DIAGNOSIS — R0600 Dyspnea, unspecified: Secondary | ICD-10-CM

## 2020-11-16 NOTE — Progress Notes (Signed)
Primary Physician/Referring:  Maude Leriche, PA-C  Patient ID: [name], female    DOB: Apr 28, 1946, 75 y.o.   MRN: [id number]  Chief complaints: hospital follow up  HPI: [name]  is a 75 y.o. female  with peripheral artery disease has known long left SFA CTO with failed attempt in September 2020 for revascularization, also has right calcific iliac artery stenosis which was felt to be high risk due to extension into right common femoral artery for dissection. She has right hip claudication and also left leg claudication that is chronic but has remained stable.  Past medical history significant for tobacco use disorder, which she quit on 10/21/2019, hypertension, COPD,Stage III chronic kidney disease, history of breast cancer, bilateral venous insufficiency and has had ablation and persistent superficial varicose veins with complications and recurrent varicose vein bleeding, last occurrence 11/14/2019 needing hospitalization and blood transfusion.  No further bleeding since last blood transfusion.  She was admitted to [facility] on 11/07/2020 and discharged 3 days later with acute exacerbation and COPD and hypoxemic respiratory failure.  She was discharged on continuous home oxygen.  States that her dyspnea has improved and she started to feel better and back to baseline since hospital discharge.  No PND, no orthopnea, no leg edema.  She has not been able to use oxygen when she ambulates although her oxygen saturation drops on home monitoring as her oxygen tanks are very large and requests a smaller tank.  She denies any bluish discoloration or ulceration in her feet.  No leg edema.   Past Medical History:  Diagnosis Date   Acute renal failure superimposed on stage 3 chronic kidney disease ([place]) 10/27/2014   Back pain, chronic    Breast cancer ([place]) 09/09/10   Cancer of colon ([place]) 05/24/2011   Centrilobular emphysema ([place]) 07/15/2018   CKD (chronic kidney disease), stage  III (HCC)    Claudication, intermittent ([place]) 07/15/2018   Depression    GIB (gastrointestinal bleeding)    H/O tobacco use, presenting hazards to health Quit Dec 2019 05/03/2011   50 years, up to 3PPD quit last year.    Hx of varicose veins of lower extremity 07/15/2018   Hypertension    SDH (subdural hematoma) (HCC)    SOB (shortness of breath) 07/15/2018    Past Surgical History:  Procedure Laterality Date   ABDOMINAL AORTOGRAM W/LOWER EXTREMITY Left 03/18/2019   Procedure: ABDOMINAL AORTOGRAM W/LOWER EXTREMITY;  Surgeon: Adrian Prows, MD;  Location: [place] [facility];  Service: Cardiovascular;  Laterality: Left;   BACK SURGERY     BREAST LUMPECTOMY Right 10/10/2010   COLON SURGERY  04/2011   COLONOSCOPY  05/03/2011   Procedure: COLONOSCOPY;  Surgeon: Jeryl Columbia, MD;  Location: [facility] ENDOSCOPY;  Service: Endoscopy;  Laterality: N/A;   JOINT REPLACEMENT     R&L total shoulder replacements   LUMBAR DISC SURGERY     PARTIAL HYSTERECTOMY     PERIPHERAL VASCULAR INTERVENTION  03/18/2019   Procedure: PERIPHERAL VASCULAR INTERVENTION;  Surgeon: Adrian Prows, MD;  Location: [place] CV LAB;  Service: Cardiovascular;;  attempted left SFA   SHOULDER SURGERY     TUMOR REMOVAL  Tumor removed R breast   Social History   Tobacco Use   Smoking status: Former    Packs/day: 0.25    Years: 30.00    Pack years: 7.50    Types: Cigarettes    Quit date: 10/31/2019    Years since quitting: 1.0   Smokeless  tobacco: Never   Tobacco comments:    had 6 cigarettes recently 03-10-20  Substance Use Topics   Alcohol use: Yes    Comment: rarely   Marital Status: Divorced   Review of Systems  Constitutional: Negative for malaise/fatigue.  Cardiovascular:  Positive for claudication (very mild) and dyspnea on exertion. Negative for chest pain, leg swelling, orthopnea, palpitations and syncope.  Respiratory:  Positive for cough.   Musculoskeletal:  Positive for arthritis, back pain and joint swelling  (Right knee).  All other systems reviewed and are negative. Objective  Blood pressure 133/65, pulse 75, temperature 98.7 F (37.1 C), temperature source Temporal, resp. rate 16, height 5' (1.524 m), weight 119 lb 9.6 oz (54.3 kg), SpO2 94 %. Body mass index is 23.36 kg/m.  Vitals with BMI 11/16/2020 11/09/2020 11/09/2020  Height 5\' 0"  - -  Weight 119 lbs 10 oz - -  BMI 93.71 - -  Systolic 696 789 381  Diastolic 65 82 79  Pulse 75 71 77     Physical Exam Vitals reviewed.  Constitutional:      Appearance: She is well-developed.  Neck:     Vascular: No carotid bruit or JVD.  Cardiovascular:     Rate and Rhythm: Normal rate and regular rhythm.     Pulses:          Femoral pulses are 2+ on the right side with bruit.      Popliteal pulses are 1+ on the right side and 1+ on the left side.       Dorsalis pedis pulses are 1+ on the right side and 0 on the left side.       Posterior tibial pulses are 1+ on the right side and 0 on the left side.     Heart sounds: Normal heart sounds.  Pulmonary:     Effort: Pulmonary effort is normal. No accessory muscle usage or respiratory distress.     Breath sounds: Wheezing (bilateral diffuse scattered expiratory ronchi) present.  Abdominal:     General: Bowel sounds are normal.     Palpations: Abdomen is soft.  Musculoskeletal:     Right lower leg: No edema.     Left lower leg: No edema.  Skin:    General: Skin is warm.     Capillary Refill: Capillary refill takes less than 2 seconds.     Findings: No erythema.  Neurological:     General: No focal deficit present.   Radiology: No results found.  Laboratory examination:    CMP Latest Ref Rng & Units 11/09/2020 11/08/2020 11/07/2020  Glucose 70 - 99 mg/dL 153(H) 156(H) 148(H)  BUN 8 - 23 mg/dL 49(H) 37(H) 41(H)  Creatinine 0.44 - 1.00 mg/dL 1.59(H) 1.30(H) 1.59(H)  Sodium 135 - 145 mmol/L 137 139 144  Potassium 3.5 - 5.1 mmol/L 4.7 4.3 3.7  Chloride 98 - 111 mmol/L 95(L) 98 98  CO2 22 - 32  mmol/L 33(H) 33(H) 33(H)  Calcium 8.9 - 10.3 mg/dL 10.0 10.5(H) 11.0(H)  Total Protein 6.5 - 8.1 g/dL 6.6 7.3 -  Total Bilirubin 0.3 - 1.2 mg/dL 0.8 0.5 -  Alkaline Phos 38 - 126 U/L 96 107 -  AST 15 - 41 U/L 33 25 -  ALT 0 - 44 U/L 20 20 -   CBC Latest Ref Rng & Units 11/09/2020 11/08/2020 11/07/2020  WBC 4.0 - 10.5 K/uL 8.0 3.8(L) 4.5  Hemoglobin 12.0 - 15.0 g/dL 14.0 13.6 14.9  Hematocrit 36.0 - 46.0 % 41.9  41.1 46.3(H)  Platelets 150 - 400 K/uL 203 192 202   Lipid Panel     Component Value Date/Time   CHOL 132 06/12/2019 1502   TRIG 62 06/12/2019 1502   HDL 73 06/12/2019 1502   CHOLHDL 1.8 06/12/2019 1502   LDLCALC 46 06/12/2019 1502   HEMOGLOBIN A1C Lab Results  Component Value Date   HGBA1C 6.1 (H) 10/28/2014   MPG 128 10/28/2014   TSH No results for input(s): TSH in the last 8760 hours.  External labs:   Hemoglobin 10.300 g/d 01/12/2020  Creatinine, Serum 1.870 mg/ 01/12/2020 Potassium 4.900 mm 01/12/2020  TSH 1.221 06/21/2019   Cardiac Studies:   01/11/2015: Hospital Lower extremity arterial duplex Evidence of multilevel arterial occlusive disease bilaterally, left greater than right. Resting ankle-brachial index is mildly depressed on the right at 0.85. The left ABI is moderately depressed at rest at 0.65. On the right side, there likely is a component of aortoiliac inflow disease given monophasic waveforms throughout.  Lower Extremity Arterial Duplex 01/14/2019: There is monophasic waveform noted in the right external iliac and CFA suggests proximal significant disease. There is severe diffuse mixed plaque noted throughout the right lower extremity.  Moderate velocity increase at the left distal superficial femoral artery, >50% stenosis. There is severe diffuse mixed plaque throughout the left lower extremity This exam reveals moderately decreased perfusion of the right lower extremity, noted at the dorsalis pedis artery level (ABI 0.75) and moderately decreased  perfusion of the left lower extremity, noted at the post tibial artery level (ABI 0.58).  No significant change from report of 01/11/2015.   Peripheral arteriogram 03/18/2019:  Dist R EIA to Prox R CFA lesion is 60% stenosed.  Prox R CIA to Mid R EIA lesion is 20% stenosed.  Prox L CIA to Mid L CFA lesion is 20% stenosed.  Prox L SFA to Dist L SFA lesion is 100% stenosed.  Three-vessel runoff below the knee on the left with brisk  flow.  Failed attempt at angioplasty of the left SFA.   Suprarenal Abd AO to Infrarenal Abd AO lesion is 30% stenosed. Severely calcified lesions.  Abdominal aortogram also reveals patent renal arteries 1 on either side. There is no evidence of abdominal aortic aneurysm.  Echocardiogram 11/08/2020:  1. Left ventricular ejection fraction, by estimation, is 65 to 70%. The left ventricle has normal function. The left ventricle has no regional wall motion abnormalities. Left ventricular diastolic parameters are consistent with Grade I diastolic dysfunction (impaired relaxation).  2. Right ventricular systolic function is normal. The right ventricular size is normal. There is normal pulmonary artery systolic pressure.  3. Left atrial size was mildly dilated.  4. The mitral valve is grossly normal. Trivial mitral valve regurgitation.  5. The aortic valve is tricuspid. Aortic valve regurgitation is not visualized.  6. The inferior vena cava is normal in size with greater than 50% respiratory variability, suggesting right atrial pressure of 3 mmHg.  7. Small PFO suspected.  Compared to 11/26/2018, there was decreased IVC respiratory variation suggestive of elevated right heart pressure and PA pressure was mildly elevated.  PFO was not evident.  EKG:  EKG 11/16/2020: Normal sinus rhythm at a rate of 64 bpm, left atrial abnormality, normal axis.  Right bundle branch block.  No evidence of ischemia.  No significant change from 07/22/2020.   Assessment     ICD-10-CM   1.  Chronic obstructive pulmonary disease with acute exacerbation (HCC)  J44.1 For home use only DME  oxygen    2. Dyspnea on exertion  R06.00     3. Essential hypertension  I10 EKG 12-Lead    4. PAD (peripheral artery disease) (HCC)  I73.9     5. Chronic hypoxemic respiratory failure (HCC)  J96.11 For home use only DME oxygen      No orders of the defined types were placed in this encounter. Medications Discontinued During This Encounter  Medication Reason   ondansetron (ZOFRAN) 4 MG tablet Error    Orders Placed This Encounter  Procedures   For home use only DME oxygen    Order Specific Question:   Length of Need    Answer:   Lifetime    Order Specific Question:   Mode or (Route)    Answer:   Nasal cannula    Order Specific Question:   Frequency    Answer:   Continuous (stationary and portable oxygen unit needed)    Order Specific Question:   Oxygen delivery system    Answer:   Gas   EKG 12-Lead    Recommendations:   [name]  is a 75 y.o. with peripheral artery disease has known long left SFA CTO with failed attempt in September 2020 for revascularization, also has right calcific iliac artery stenosis which was felt to be high risk due to extension into right common femoral artery for dissection. She has right hip claudication and also left leg claudication that is chronic, symptoms improved with addition of Neurontin.  Past medical history significant for tobacco use disorder, which she quit on 10/21/2019 and has remained abstinent, hypertension, COPD,Stage III chronic kidney disease, history of breast cancer, bilateral venous insufficiency and has had ablation and persistent superficial varicose veins with complications and recurrent varicose vein bleeding, last occurrence 11/14/2019 needing hospitalization and blood transfusion. No further bleeding since then fortunately.    She was admitted to [facility] on 11/07/2020 and discharged 3 days later with acute  exacerbation and COPD and hypoxemic respiratory failure.  She was discharged on continuous home oxygen.  I reviewed her medical chart, she is presently not using oxygen when she ambulates as she has a very large tank and it makes it difficult for her to ambulate.  I have ordered small tank and also portable oxygen unit.  From cardiac and vascular standpoint, fortunately she has remained stable without any chest pain, decompensated heart failure, limb threatening ischemia or significant claudication.  She has also remained abstinent from tobacco.  I will see her back in 6 months or sooner if problems.   Adrian Prows, MD, [facility] 11/16/2020, 3:19 PM Office: [phone]

## 2020-11-18 DIAGNOSIS — N1832 Chronic kidney disease, stage 3b: Secondary | ICD-10-CM | POA: Diagnosis not present

## 2020-11-18 DIAGNOSIS — M6281 Muscle weakness (generalized): Secondary | ICD-10-CM | POA: Diagnosis not present

## 2020-11-18 DIAGNOSIS — J9621 Acute and chronic respiratory failure with hypoxia: Secondary | ICD-10-CM | POA: Diagnosis not present

## 2020-11-18 DIAGNOSIS — J441 Chronic obstructive pulmonary disease with (acute) exacerbation: Secondary | ICD-10-CM | POA: Diagnosis not present

## 2020-11-18 DIAGNOSIS — R778 Other specified abnormalities of plasma proteins: Secondary | ICD-10-CM | POA: Diagnosis not present

## 2020-11-18 DIAGNOSIS — I5032 Chronic diastolic (congestive) heart failure: Secondary | ICD-10-CM | POA: Diagnosis not present

## 2020-11-18 DIAGNOSIS — I130 Hypertensive heart and chronic kidney disease with heart failure and stage 1 through stage 4 chronic kidney disease, or unspecified chronic kidney disease: Secondary | ICD-10-CM | POA: Diagnosis not present

## 2020-12-01 DIAGNOSIS — M6281 Muscle weakness (generalized): Secondary | ICD-10-CM | POA: Diagnosis not present

## 2020-12-01 DIAGNOSIS — R790 Abnormal level of blood mineral: Secondary | ICD-10-CM | POA: Diagnosis not present

## 2020-12-01 DIAGNOSIS — J439 Emphysema, unspecified: Secondary | ICD-10-CM | POA: Diagnosis not present

## 2020-12-01 DIAGNOSIS — D649 Anemia, unspecified: Secondary | ICD-10-CM | POA: Diagnosis not present

## 2020-12-01 DIAGNOSIS — J9621 Acute and chronic respiratory failure with hypoxia: Secondary | ICD-10-CM | POA: Diagnosis not present

## 2020-12-01 DIAGNOSIS — J441 Chronic obstructive pulmonary disease with (acute) exacerbation: Secondary | ICD-10-CM | POA: Diagnosis not present

## 2020-12-01 DIAGNOSIS — I5032 Chronic diastolic (congestive) heart failure: Secondary | ICD-10-CM | POA: Diagnosis not present

## 2020-12-01 DIAGNOSIS — N1832 Chronic kidney disease, stage 3b: Secondary | ICD-10-CM | POA: Diagnosis not present

## 2020-12-01 DIAGNOSIS — R778 Other specified abnormalities of plasma proteins: Secondary | ICD-10-CM | POA: Diagnosis not present

## 2020-12-01 DIAGNOSIS — I130 Hypertensive heart and chronic kidney disease with heart failure and stage 1 through stage 4 chronic kidney disease, or unspecified chronic kidney disease: Secondary | ICD-10-CM | POA: Diagnosis not present

## 2020-12-02 ENCOUNTER — Ambulatory Visit (INDEPENDENT_AMBULATORY_CARE_PROVIDER_SITE_OTHER): Payer: Medicare Other | Admitting: Internal Medicine

## 2020-12-02 ENCOUNTER — Inpatient Hospital Stay: Payer: Medicare Other | Admitting: Emergency Medicine

## 2020-12-02 ENCOUNTER — Other Ambulatory Visit: Payer: Self-pay

## 2020-12-02 ENCOUNTER — Encounter: Payer: Self-pay | Admitting: Internal Medicine

## 2020-12-02 VITALS — BP 110/60 | HR 84 | Temp 98.2°F | Ht 60.0 in | Wt 128.0 lb

## 2020-12-02 DIAGNOSIS — J9611 Chronic respiratory failure with hypoxia: Secondary | ICD-10-CM | POA: Diagnosis not present

## 2020-12-02 DIAGNOSIS — J432 Centrilobular emphysema: Secondary | ICD-10-CM | POA: Diagnosis not present

## 2020-12-02 NOTE — Progress Notes (Signed)
[name]    [id number]    03/10/1946  Primary Care Physician:Scifres, Durel Salts Date of Appointment: 04/08/2020 Established Patient Visit  Chief complaint:   Chief Complaint  Patient presents with   Follow-up    Pt states she has had some problems with her breathing since last visit as she has been in the hospital since. Pt has had increased SOB.     HPI: Holly S Steele is a 75 y.o. woman with emphysema who presents with shortness of breath.  Interval Updates: Here for follow up. Had recent hospital stay for COPD exacerbation. Feels that breathing has gotten a bit worse over the last 6 months. Still doesn't want to take any inhalers. Wants to know when she should be wearing oxygen. Friend with her, notes she sometimes slows down breathing in her sleep, very shallow.    I have reviewed the patient's family social and past medical history and updated as appropriate.   Past Medical History:  Diagnosis Date   Acute renal failure superimposed on stage 3 chronic kidney disease ([place]) 10/27/2014   Back pain, chronic    Breast cancer ([place]) 09/09/10   Cancer of colon ([place]) 05/24/2011   Centrilobular emphysema ([place]) 07/15/2018   CKD (chronic kidney disease), stage III (HCC)    Claudication, intermittent ([place]) 07/15/2018   Depression    GIB (gastrointestinal bleeding)    H/O tobacco use, presenting hazards to health Quit Dec 2019 05/03/2011   50 years, up to 3PPD quit last year.    Hx of varicose veins of lower extremity 07/15/2018   Hypertension    SDH (subdural hematoma) (HCC)    SOB (shortness of breath) 07/15/2018    Past Surgical History:  Procedure Laterality Date   ABDOMINAL AORTOGRAM W/LOWER EXTREMITY Left 03/18/2019   Procedure: ABDOMINAL AORTOGRAM W/LOWER EXTREMITY;  Surgeon: Adrian Prows, MD;  Location: [place] [facility];  Service: Cardiovascular;  Laterality: Left;   BACK SURGERY     BREAST LUMPECTOMY Right 10/10/2010   COLON SURGERY  04/2011    COLONOSCOPY  05/03/2011   Procedure: COLONOSCOPY;  Surgeon: Jeryl Columbia, MD;  Location: [facility] ENDOSCOPY;  Service: Endoscopy;  Laterality: N/A;   JOINT REPLACEMENT     R&L total shoulder replacements   LUMBAR DISC SURGERY     PARTIAL HYSTERECTOMY     PERIPHERAL VASCULAR INTERVENTION  03/18/2019   Procedure: PERIPHERAL VASCULAR INTERVENTION;  Surgeon: Adrian Prows, MD;  Location: [place] CV LAB;  Service: Cardiovascular;;  attempted left SFA   SHOULDER SURGERY     TUMOR REMOVAL  Tumor removed R breast    Family History  Problem Relation Age of Onset   Diabetes Mother    Hypertension Mother    Cancer Mother        leukemia   Sudden death Mother    Heart attack Mother    Anesthesia problems Neg Hx    Hypotension Neg Hx    Malignant hyperthermia Neg Hx    Pseudochol deficiency Neg Hx    Breast cancer Neg Hx    Lung disease Neg Hx     Social History   Occupational History   Not on file  Tobacco Use   Smoking status: Former Smoker    Packs/day: 0.25    Years: 30.00    Pack years: 7.50    Types: Cigarettes    Quit date: 10/31/2019    Years since quitting: 0.4   Smokeless tobacco: Never  Used   Tobacco comment: had 6 cigarettes recently 03-10-20  Vaping Use   Vaping Use: Never used  Substance and Sexual Activity   Alcohol use: Yes    Comment: rarely    Drug use: No   Sexual activity: Not Currently     Physical Exam: Blood pressure 130/70, pulse (!) 53, temperature 98.8 F (37.1 C), temperature source Temporal, height 5' (1.524 m), weight 132 lb (59.9 kg), SpO2 94 %.  Gen:      No acute distress, elderly Lungs:    kyphosis, diminished, no wheezes or crackles CV:         Regular rate and rhythm; no murmurs, rubs, or gallops.  No pedal edema Ext:   [place]'s nodes consistent with osteoarthritis  Data Reviewed: Imaging: I have personally reviewed the chest xrays June 2022 chronic interstitial prominence, no acute process  PFTs:  PFT Results Latest Ref Rng & Units  04/08/2020  FVC-Pre L 1.37  FVC-Predicted Pre % 56  FVC-Post L 1.70  FVC-Predicted Post % 70  Pre FEV1/FVC % % 70  Post FEV1/FCV % % 57  FEV1-Pre L 0.96  FEV1-Predicted Pre % 53  FEV1-Post L 0.96  DLCO uncorrected ml/min/mmHg 9.26  DLCO UNC% % 55  DLCO corrected ml/min/mmHg 9.26  DLCO COR %Predicted % 55  DLVA Predicted % 65  TLC L 4.47  TLC % Predicted % 100  RV % Predicted % 121   I have personally reviewed the patient's PFTs and they show moderately severe airflow limitation with a positive bronchodilator response. There is reduced diffusion capacity as well.   Labs:  Immunization status: Immunization History  Administered Date(s) Administered   PFIZER SARS-COV-2 Vaccination 08/15/2019, 08/26/2019    Assessment:  Moderately Severe Emphysema FEV 54% of predicted Severe kyphosis Chronic Respiratory Failure on 3LNC continuously.   Plan/Recommendations:  Ms. Trafton continues to want to stay off all inhaler therapy. Ambulatory desat study again confirms she needs 3LNC with exertion. Will order ONO for nocturnal oxygen needs. Will prescribe portable oxygen concentrator with inogen today. I will see her back in 3-4 months.    Return to Care: Return in about 4 months (around 04/04/2021).   Lenice Llamas, MD [facility] [place]

## 2020-12-02 NOTE — Patient Instructions (Signed)
Please schedule follow up scheduled with myself in 4 months.  If my schedule is not open yet, we will contact you with a reminder closer to that time.  We will do an overnight oximetry test to see how much oxygen you need at night.  We prescribed inogen machine for you today after your walk test.

## 2020-12-07 DIAGNOSIS — Z79891 Long term (current) use of opiate analgesic: Secondary | ICD-10-CM | POA: Diagnosis not present

## 2020-12-07 DIAGNOSIS — G894 Chronic pain syndrome: Secondary | ICD-10-CM | POA: Diagnosis not present

## 2020-12-07 DIAGNOSIS — M961 Postlaminectomy syndrome, not elsewhere classified: Secondary | ICD-10-CM | POA: Diagnosis not present

## 2020-12-07 DIAGNOSIS — M47816 Spondylosis without myelopathy or radiculopathy, lumbar region: Secondary | ICD-10-CM | POA: Diagnosis not present

## 2020-12-10 DIAGNOSIS — R0683 Snoring: Secondary | ICD-10-CM | POA: Diagnosis not present

## 2020-12-10 DIAGNOSIS — G4730 Sleep apnea, unspecified: Secondary | ICD-10-CM | POA: Diagnosis not present

## 2020-12-12 DIAGNOSIS — J432 Centrilobular emphysema: Secondary | ICD-10-CM | POA: Diagnosis not present

## 2020-12-14 ENCOUNTER — Telehealth: Payer: Self-pay | Admitting: Internal Medicine

## 2020-12-14 DIAGNOSIS — J432 Centrilobular emphysema: Secondary | ICD-10-CM

## 2020-12-14 DIAGNOSIS — J9611 Chronic respiratory failure with hypoxia: Secondary | ICD-10-CM

## 2020-12-14 NOTE — Telephone Encounter (Signed)
Received overnight oximetry - patient does require nocturnal oxygen 3LNC as she is wearing during the day.

## 2020-12-14 NOTE — Telephone Encounter (Signed)
done

## 2020-12-15 NOTE — Telephone Encounter (Signed)
Called and spoke with pt letting her know the results of the ONO and she verbalized understanding. Nothing further needed. 

## 2020-12-15 NOTE — Telephone Encounter (Signed)
Pt is returning phone call to go over ONO results. Pls regard; [phone]

## 2020-12-15 NOTE — Telephone Encounter (Signed)
Attempted to call pt to go over results of recent ONO but unable to reach. Left message for her to return call.

## 2020-12-24 ENCOUNTER — Telehealth: Payer: Self-pay | Admitting: Internal Medicine

## 2020-12-24 NOTE — Telephone Encounter (Signed)
LMTCB  Will send message to provider to see if willing to send order for rollator or would they like this handled by PCP.   ND please advise. Thanks :)

## 2020-12-27 ENCOUNTER — Telehealth: Payer: Self-pay | Admitting: Internal Medicine

## 2020-12-27 NOTE — Telephone Encounter (Signed)
Call returned to patient, confirmed DOB. Made aware of ONO results. Voiced understanding. She states she does not recall being informed of the results. Made aware.   Message sent to [facility] to check status of POC order. Will await f/u.   Will hold in triage until we hear back from [place].

## 2020-12-28 NOTE — Telephone Encounter (Signed)
Called and spoke with patient. She is aware to contact her PCP.   Nothing further needed.

## 2021-01-06 DIAGNOSIS — Z79891 Long term (current) use of opiate analgesic: Secondary | ICD-10-CM | POA: Diagnosis not present

## 2021-01-06 DIAGNOSIS — M961 Postlaminectomy syndrome, not elsewhere classified: Secondary | ICD-10-CM | POA: Diagnosis not present

## 2021-01-06 DIAGNOSIS — G894 Chronic pain syndrome: Secondary | ICD-10-CM | POA: Diagnosis not present

## 2021-01-06 DIAGNOSIS — M47816 Spondylosis without myelopathy or radiculopathy, lumbar region: Secondary | ICD-10-CM | POA: Diagnosis not present

## 2021-01-11 NOTE — Telephone Encounter (Signed)
Melissa w/ [employer] called and stated pt came in for POC eval and passed. She qualified for  NLS w/ P5 however pt only want's POC but insurance won't cover it b/c she's been on O2 too long. Please advise.  Melissa [phone]

## 2021-01-11 NOTE — Telephone Encounter (Signed)
Spoke with the pt  She states upset that she has not received her POC yet  I advised her of what Melissa had said in the msg that she left earlier, and that we have called her back for more info  Pt aware that once we hear more from [place] we will let her know  Hold for Melissa to call back

## 2021-01-11 NOTE — Telephone Encounter (Signed)
Pt calling back regarding Rollator walker order. Advised that she was told her PCP needs to order it. Pt states she doesn't see why Dr. Shearon Stalls can't order it if she was referred her by pcp. Please advise.  Pt also has questions regarding POC order.

## 2021-01-11 NOTE — Telephone Encounter (Signed)
Called and LMTCB for [employer]

## 2021-01-12 ENCOUNTER — Telehealth: Payer: Self-pay | Admitting: Internal Medicine

## 2021-01-12 DIAGNOSIS — J432 Centrilobular emphysema: Secondary | ICD-10-CM | POA: Diagnosis not present

## 2021-01-12 NOTE — Telephone Encounter (Signed)
Called and spoke with patient. She stated that she has been calling [employer] daily now for the past 2 weeks to check on the status of the POC order and walker. Per [employer], they have not received any orders from our office. I advised her that we have placed 2 orders to [employer] back in July. I reminded her again that she needs to contact her PCP for the walker order as Dr. Shearon Stalls has instructed a few a weeks ago. She is aware that I will reach out to [employer] to see where she stands with the order processing. She verbalized understanding.   Community message has been sent to the [employer] to check on the status of her orders.

## 2021-01-12 NOTE — Telephone Encounter (Signed)
Received a message from [place] at [place]. She stated that the patient has already been setup with her O2 and should have everything that she needs. She will reach out to the patient to see what is going on.   Will close this encounter.

## 2021-01-19 DIAGNOSIS — R202 Paresthesia of skin: Secondary | ICD-10-CM | POA: Diagnosis not present

## 2021-01-19 DIAGNOSIS — R29898 Other symptoms and signs involving the musculoskeletal system: Secondary | ICD-10-CM | POA: Diagnosis not present

## 2021-01-19 DIAGNOSIS — J439 Emphysema, unspecified: Secondary | ICD-10-CM | POA: Diagnosis not present

## 2021-01-20 DIAGNOSIS — J449 Chronic obstructive pulmonary disease, unspecified: Secondary | ICD-10-CM | POA: Diagnosis not present

## 2021-01-20 DIAGNOSIS — M6281 Muscle weakness (generalized): Secondary | ICD-10-CM | POA: Diagnosis not present

## 2021-01-27 DIAGNOSIS — R202 Paresthesia of skin: Secondary | ICD-10-CM | POA: Diagnosis not present

## 2021-01-27 DIAGNOSIS — R29898 Other symptoms and signs involving the musculoskeletal system: Secondary | ICD-10-CM | POA: Diagnosis not present

## 2021-01-27 DIAGNOSIS — I872 Venous insufficiency (chronic) (peripheral): Secondary | ICD-10-CM | POA: Diagnosis not present

## 2021-01-27 DIAGNOSIS — J449 Chronic obstructive pulmonary disease, unspecified: Secondary | ICD-10-CM | POA: Diagnosis not present

## 2021-01-31 ENCOUNTER — Ambulatory Visit: Payer: Medicare Other | Admitting: Cardiology

## 2021-01-31 ENCOUNTER — Telehealth: Payer: Self-pay | Admitting: Cardiology

## 2021-01-31 ENCOUNTER — Other Ambulatory Visit: Payer: Self-pay

## 2021-01-31 DIAGNOSIS — I10 Essential (primary) hypertension: Secondary | ICD-10-CM

## 2021-01-31 MED ORDER — AMLODIPINE BESYLATE 5 MG PO TABS: 5.0000 mg | ORAL_TABLET | Freq: Every day | ORAL | 1 refills | Status: DC

## 2021-01-31 NOTE — Telephone Encounter (Signed)
Pt requesting amlodipine refill (doesn't have follow up appt until 12/28).

## 2021-01-31 NOTE — Telephone Encounter (Signed)
Done

## 2021-02-02 ENCOUNTER — Other Ambulatory Visit: Payer: Self-pay | Admitting: Internal Medicine

## 2021-02-02 DIAGNOSIS — J432 Centrilobular emphysema: Secondary | ICD-10-CM

## 2021-02-02 NOTE — Progress Notes (Signed)
Please prescribe rollator for oxygen to [employer] [facility].

## 2021-02-08 DIAGNOSIS — M47816 Spondylosis without myelopathy or radiculopathy, lumbar region: Secondary | ICD-10-CM | POA: Diagnosis not present

## 2021-02-08 DIAGNOSIS — G894 Chronic pain syndrome: Secondary | ICD-10-CM | POA: Diagnosis not present

## 2021-02-08 DIAGNOSIS — M961 Postlaminectomy syndrome, not elsewhere classified: Secondary | ICD-10-CM | POA: Diagnosis not present

## 2021-02-08 DIAGNOSIS — Z79891 Long term (current) use of opiate analgesic: Secondary | ICD-10-CM | POA: Diagnosis not present

## 2021-02-09 NOTE — Progress Notes (Signed)
Order sent to [employer] for Rollator.  Nothing further needed.

## 2021-02-12 DIAGNOSIS — J432 Centrilobular emphysema: Secondary | ICD-10-CM | POA: Diagnosis not present

## 2021-02-14 ENCOUNTER — Other Ambulatory Visit: Payer: Self-pay | Admitting: Cardiology

## 2021-02-14 DIAGNOSIS — I739 Peripheral vascular disease, unspecified: Secondary | ICD-10-CM

## 2021-02-14 NOTE — Telephone Encounter (Signed)
LMTCB for her to see if she got what she needed from [employer] yet

## 2021-02-24 ENCOUNTER — Telehealth: Payer: Self-pay

## 2021-02-24 MED ORDER — FUROSEMIDE 20 MG PO TABS: 20.0000 mg | ORAL_TABLET | Freq: Every day | ORAL | 1 refills | Status: DC

## 2021-02-24 NOTE — Telephone Encounter (Signed)
LMTCB

## 2021-03-02 ENCOUNTER — Telehealth: Payer: Self-pay | Admitting: Internal Medicine

## 2021-03-02 NOTE — Telephone Encounter (Signed)
I have called [employer] in [place]---[phone] and they stated that the pt will have to qualify for her oxygen every 90 days.   She did state that she faxed over the form from the pts visit in their office 12/22/20 that gave the settings that the pt is to use.  I have scheduled the pt for an OV and qualifying walk on 10/20 at 3.  Pt is aware.

## 2021-03-03 NOTE — Telephone Encounter (Signed)
Error

## 2021-03-08 DIAGNOSIS — M25561 Pain in right knee: Secondary | ICD-10-CM | POA: Diagnosis not present

## 2021-03-10 ENCOUNTER — Ambulatory Visit (INDEPENDENT_AMBULATORY_CARE_PROVIDER_SITE_OTHER): Payer: Medicare Other | Admitting: Internal Medicine

## 2021-03-10 ENCOUNTER — Other Ambulatory Visit: Payer: Self-pay

## 2021-03-10 ENCOUNTER — Encounter: Payer: Self-pay | Admitting: Internal Medicine

## 2021-03-10 VITALS — BP 130/74 | HR 80 | Temp 98.4°F | Ht <= 58 in | Wt 128.0 lb

## 2021-03-10 DIAGNOSIS — J9611 Chronic respiratory failure with hypoxia: Secondary | ICD-10-CM

## 2021-03-10 DIAGNOSIS — J449 Chronic obstructive pulmonary disease, unspecified: Secondary | ICD-10-CM | POA: Diagnosis not present

## 2021-03-10 NOTE — Progress Notes (Signed)
[name]    [id number]    25-Dec-1945  Primary Care Physician:Scifres, Durel Salts Date of Appointment: 03/10/2021 Established Patient Visit  Chief complaint:   Chief Complaint  Patient presents with   Follow-up    Oxygen recertification      HPI: [name] is a 75 y.o. woman with emphysema on 3LNC continuously who presents with shortness of breath.  Interval Updates: Here for follow up for oxygen recertification and COPD follow up. No hospitalizations or ED visits She did receive her walker but couldn't life the walker so she returned it.  She is here with her friend today. She is tearful and having difficulty coming to terms with her limited independence. [employer] has not brought Oxygen tanks to her house. They have not brought a humidifier. She is interested in a motorized scooter to use when she is going out with family or friends who can help her. She wants to make the most of her time.    I have reviewed the patient's family social and past medical history and updated as appropriate.   Past Medical History:  Diagnosis Date   Acute renal failure superimposed on stage 3 chronic kidney disease ([place]) 10/27/2014   Back pain, chronic    Breast cancer ([place]) 09/09/10   Cancer of colon ([place]) 05/24/2011   Centrilobular emphysema ([place]) 07/15/2018   CKD (chronic kidney disease), stage III (HCC)    Claudication, intermittent ([place]) 07/15/2018   Depression    GIB (gastrointestinal bleeding)    H/O tobacco use, presenting hazards to health Quit Dec 2019 05/03/2011   50 years, up to 3PPD quit last year.    Hx of varicose veins of lower extremity 07/15/2018   Hypertension    SDH (subdural hematoma)    SOB (shortness of breath) 07/15/2018    Past Surgical History:  Procedure Laterality Date   ABDOMINAL AORTOGRAM W/LOWER EXTREMITY Left 03/18/2019   Procedure: ABDOMINAL AORTOGRAM W/LOWER EXTREMITY;  Surgeon: Adrian Prows, MD;  Location: [facility];  Service:  Cardiovascular;  Laterality: Left;   BACK SURGERY     BREAST LUMPECTOMY Right 10/10/2010   COLON SURGERY  04/2011   COLONOSCOPY  05/03/2011   Procedure: COLONOSCOPY;  Surgeon: Jeryl Columbia, MD;  [facility];  Service: Endoscopy;  Laterality: N/A;   JOINT REPLACEMENT     R&L total shoulder replacements   LUMBAR DISC SURGERY     PARTIAL HYSTERECTOMY     PERIPHERAL VASCULAR INTERVENTION  03/18/2019   Procedure: PERIPHERAL VASCULAR INTERVENTION;  Surgeon: Adrian Prows, MD;  Location: [facility];  Service: Cardiovascular;;  attempted left SFA   SHOULDER SURGERY     TUMOR REMOVAL  Tumor removed R breast    Family History  Problem Relation Age of Onset   Diabetes Mother    Hypertension Mother    Cancer Mother        leukemia   Sudden death Mother    Heart attack Mother    Anesthesia problems Neg Hx    Hypotension Neg Hx    Malignant hyperthermia Neg Hx    Pseudochol deficiency Neg Hx    Breast cancer Neg Hx    Lung disease Neg Hx     Social History   Occupational History   Not on file  Tobacco Use   Smoking status: Former    Packs/day: 0.25    Years: 30.00    Pack years: 7.50  Types: Cigarettes    Quit date: 10/31/2019    Years since quitting: 1.3   Smokeless tobacco: Never   Tobacco comments:    had 6 cigarettes recently 03-10-20  Vaping Use   Vaping Use: Never used  Substance and Sexual Activity   Alcohol use: Yes    Comment: rarely    Drug use: No   Sexual activity: Not Currently     Physical Exam: Blood pressure 130/74, pulse 80, temperature 98.4 F (36.9 C), temperature source Oral, height 4\' 10"  (1.473 m), weight 128 lb (58.1 kg), SpO2 90 %.  Gen:      No acute distress, elderly, chronically ill Lungs:    severe kyphosis,  CV:         RRR no mrg Ext:   Heberden's nodes consistent with osteoarthritis  Data Reviewed: Imaging: I have personally reviewed the chest xrays June 2022 chronic interstitial prominence, no acute  process  PFTs:   PFT Results Latest Ref Rng & Units 04/08/2020  FVC-Pre L 1.37  FVC-Predicted Pre % 56  FVC-Post L 1.70  FVC-Predicted Post % 70  Pre FEV1/FVC % % 70  Post FEV1/FCV % % 57  FEV1-Pre L 0.96  FEV1-Predicted Pre % 53  FEV1-Post L 0.96  DLCO uncorrected ml/min/mmHg 9.26  DLCO UNC% % 55  DLCO corrected ml/min/mmHg 9.26  DLCO COR %Predicted % 55  DLVA Predicted % 65  TLC L 4.47  TLC % Predicted % 100  RV % Predicted % 121   I have personally reviewed the patient's PFTs and they show moderately severe airflow limitation with a positive bronchodilator response. There is reduced diffusion capacity as well.   Labs:  Immunization status: Immunization History  Administered Date(s) Administered   PFIZER(Purple Top)SARS-COV-2 Vaccination 08/15/2019, 08/26/2019    Assessment:  Moderately Severe Emphysema FEV 54% of predicted Severe kyphosis Chronic Respiratory Failure on 3LNC continuously.  Some day tobacco use disorder  Plan/Recommendations:  [name] continues to want to stay off all inhaler therapy.  She continues to smoke occasionally.  Ambulatory desat study again confirms she needs 3LNC continuously including nocturnally. She desats to 86% on 5L puls POC. We will contact [employer] today to send her a humidifer for POC, oxygen tanks, and motorized scooter. We discussed how POC is not appropriate for her. She is tearful about this. She says she will try to be as active as she can for as long as she can. Her goals are to spend time with friends and family - wants to take grandson to the zoo.   I would like to see her back in 3 months, she prefers 6.   I spent 31 minutes in the care of this patient today including pre-charting, chart review, review of results, face-to-face care, coordination of care and communication with consultants etc.).   Return to Care: Return in about 6 months (around 09/08/2021).   Lenice Llamas, MD Pulmonary and [place]

## 2021-03-10 NOTE — Patient Instructions (Addendum)
Please schedule follow up scheduled with myself in 3 months.  If my schedule is not open yet, we will contact you with a reminder closer to that time.  Moisturize the inside of your nose with a qtip using water based lubricant such as k-y jelly. Do not use anything petroleum or oil based.  We will call [employer] to get you a motorized scooter, humidfier, and oxygen tanks.   Call me if your breathing gets worse before then.

## 2021-03-14 DIAGNOSIS — M47816 Spondylosis without myelopathy or radiculopathy, lumbar region: Secondary | ICD-10-CM | POA: Diagnosis not present

## 2021-03-14 DIAGNOSIS — M961 Postlaminectomy syndrome, not elsewhere classified: Secondary | ICD-10-CM | POA: Diagnosis not present

## 2021-03-14 DIAGNOSIS — Z79891 Long term (current) use of opiate analgesic: Secondary | ICD-10-CM | POA: Diagnosis not present

## 2021-03-14 DIAGNOSIS — G894 Chronic pain syndrome: Secondary | ICD-10-CM | POA: Diagnosis not present

## 2021-03-14 DIAGNOSIS — J432 Centrilobular emphysema: Secondary | ICD-10-CM | POA: Diagnosis not present

## 2021-03-16 DIAGNOSIS — J449 Chronic obstructive pulmonary disease, unspecified: Secondary | ICD-10-CM | POA: Diagnosis not present

## 2021-03-16 DIAGNOSIS — I872 Venous insufficiency (chronic) (peripheral): Secondary | ICD-10-CM | POA: Diagnosis not present

## 2021-03-16 DIAGNOSIS — R202 Paresthesia of skin: Secondary | ICD-10-CM | POA: Diagnosis not present

## 2021-03-31 DIAGNOSIS — C50911 Malignant neoplasm of unspecified site of right female breast: Secondary | ICD-10-CM | POA: Diagnosis not present

## 2021-04-05 DIAGNOSIS — M25561 Pain in right knee: Secondary | ICD-10-CM | POA: Diagnosis not present

## 2021-04-07 ENCOUNTER — Telehealth: Payer: Self-pay | Admitting: Internal Medicine

## 2021-04-07 DIAGNOSIS — J9611 Chronic respiratory failure with hypoxia: Secondary | ICD-10-CM

## 2021-04-07 NOTE — Telephone Encounter (Signed)
I spoke with the pt  She states [employer] told her that the scooter we ordered was denied by her insurance  She called [employer] and they told her that they would approve this for her through their DME  I placed new order to go to [facility] for this

## 2021-04-11 DIAGNOSIS — G894 Chronic pain syndrome: Secondary | ICD-10-CM | POA: Diagnosis not present

## 2021-04-11 DIAGNOSIS — Z79891 Long term (current) use of opiate analgesic: Secondary | ICD-10-CM | POA: Diagnosis not present

## 2021-04-11 DIAGNOSIS — M961 Postlaminectomy syndrome, not elsewhere classified: Secondary | ICD-10-CM | POA: Diagnosis not present

## 2021-04-11 DIAGNOSIS — M47816 Spondylosis without myelopathy or radiculopathy, lumbar region: Secondary | ICD-10-CM | POA: Diagnosis not present

## 2021-04-14 DIAGNOSIS — J432 Centrilobular emphysema: Secondary | ICD-10-CM | POA: Diagnosis not present

## 2021-05-04 DIAGNOSIS — C50911 Malignant neoplasm of unspecified site of right female breast: Secondary | ICD-10-CM | POA: Diagnosis not present

## 2021-05-06 ENCOUNTER — Telehealth: Payer: Self-pay | Admitting: Internal Medicine

## 2021-05-06 DIAGNOSIS — J432 Centrilobular emphysema: Secondary | ICD-10-CM

## 2021-05-06 DIAGNOSIS — J449 Chronic obstructive pulmonary disease, unspecified: Secondary | ICD-10-CM

## 2021-05-06 DIAGNOSIS — R0602 Shortness of breath: Secondary | ICD-10-CM

## 2021-05-06 DIAGNOSIS — J9611 Chronic respiratory failure with hypoxia: Secondary | ICD-10-CM

## 2021-05-09 DIAGNOSIS — G894 Chronic pain syndrome: Secondary | ICD-10-CM | POA: Diagnosis not present

## 2021-05-09 DIAGNOSIS — Z79891 Long term (current) use of opiate analgesic: Secondary | ICD-10-CM | POA: Diagnosis not present

## 2021-05-09 DIAGNOSIS — M47816 Spondylosis without myelopathy or radiculopathy, lumbar region: Secondary | ICD-10-CM | POA: Diagnosis not present

## 2021-05-09 DIAGNOSIS — M961 Postlaminectomy syndrome, not elsewhere classified: Secondary | ICD-10-CM | POA: Diagnosis not present

## 2021-05-09 NOTE — Telephone Encounter (Signed)
Called and spoke with pt who said that she spoke with [employer] concerning her getting a scooter and said that she believes they will be faxing over paperwork that ND will need to fill out. Pt also is requesting to have smaller O2 tanks that she could use when she goes out of the house as the larger tanks she has are not easy for her to maneuver. Pt uses 3L continuous.  Tried to call [employer] to make sure that paperwork was being faxed over to our office but was on hold for 20min and never was able to get anyone on the phone. Did see an order that had been sent to [place] about the scooter 04/07/21.  In regards to the smaller O2 tanks, Dr. Shearon Stalls, please advise if you are okay with [name] placing an order for pt to receive smaller O2 tanks.

## 2021-05-10 NOTE — Telephone Encounter (Signed)
Order placed.   LM informing patient order has been placed.   Nothing further needed at this time.

## 2021-05-11 ENCOUNTER — Other Ambulatory Visit: Payer: Self-pay | Admitting: Cardiology

## 2021-05-11 DIAGNOSIS — I10 Essential (primary) hypertension: Secondary | ICD-10-CM

## 2021-05-14 DIAGNOSIS — J432 Centrilobular emphysema: Secondary | ICD-10-CM | POA: Diagnosis not present

## 2021-05-18 ENCOUNTER — Ambulatory Visit: Payer: Medicare Other | Admitting: Cardiology

## 2021-05-26 ENCOUNTER — Telehealth: Payer: Self-pay | Admitting: Internal Medicine

## 2021-05-26 ENCOUNTER — Encounter: Payer: Self-pay | Admitting: Hematology

## 2021-05-26 NOTE — Telephone Encounter (Signed)
Called and spoke with patient directly. She stated that she does want to switch over to [facility] [place] because they will be able to provide her with a POC. I advised her that since she is switching to a new company, we would need to get her requalified for O2. She is scheduled to see ND next week at 130pm. Advised her that we would need to walk her at this visit. She verbalized understanding. Appt notes updated.   Nothing further needed at time of call.

## 2021-05-29 ENCOUNTER — Other Ambulatory Visit: Payer: Self-pay | Admitting: Cardiology

## 2021-06-02 ENCOUNTER — Telehealth: Payer: Self-pay | Admitting: Internal Medicine

## 2021-06-02 ENCOUNTER — Ambulatory Visit: Payer: Medicare Other | Admitting: Internal Medicine

## 2021-06-02 NOTE — Telephone Encounter (Signed)
Called and spoke with patient. She stated that she received a call yesterday from our office stating that ND would not be in office today and she needed to reschedule. Whomever called her did not document any calls yesterday and they also left her on the schedule for 130pm today. I could not find any documentation.   I apologized to her for the mistake. I was able to get her scheduled for 06/21/21 at 245pm. She verbalized understanding.   Nothing further needed at time of call.

## 2021-06-07 ENCOUNTER — Other Ambulatory Visit: Payer: Self-pay | Admitting: Cardiology

## 2021-06-07 ENCOUNTER — Other Ambulatory Visit: Payer: Self-pay

## 2021-06-07 DIAGNOSIS — Z1231 Encounter for screening mammogram for malignant neoplasm of breast: Secondary | ICD-10-CM

## 2021-06-07 DIAGNOSIS — N644 Mastodynia: Secondary | ICD-10-CM

## 2021-06-10 ENCOUNTER — Other Ambulatory Visit: Payer: Self-pay

## 2021-06-10 ENCOUNTER — Encounter: Payer: Self-pay | Admitting: Cardiology

## 2021-06-10 ENCOUNTER — Ambulatory Visit: Payer: Medicare Other | Admitting: Cardiology

## 2021-06-10 VITALS — BP 148/80 | HR 95 | Temp 98.0°F | Resp 16 | Ht <= 58 in | Wt 124.0 lb

## 2021-06-10 DIAGNOSIS — I739 Peripheral vascular disease, unspecified: Secondary | ICD-10-CM

## 2021-06-10 DIAGNOSIS — J441 Chronic obstructive pulmonary disease with (acute) exacerbation: Secondary | ICD-10-CM | POA: Diagnosis not present

## 2021-06-10 DIAGNOSIS — J432 Centrilobular emphysema: Secondary | ICD-10-CM | POA: Diagnosis not present

## 2021-06-10 DIAGNOSIS — I10 Essential (primary) hypertension: Secondary | ICD-10-CM | POA: Diagnosis not present

## 2021-06-10 NOTE — Progress Notes (Signed)
Primary Physician/Referring:  Maude Leriche, PA-C  Patient ID: Holly S Sawhney, female    DOB: September 15, 1945, 76 y.o.   MRN: [id number]  Chief complaints: hospital follow up  HPI: [name]  is a 76 y.o. female  with peripheral artery disease has known long left SFA CTO with failed attempt in September 2020 for revascularization, also has right calcific iliac artery stenosis which was felt to be high risk due to extension into right common femoral artery for dissection. She has right hip claudication and also left leg claudication that is chronic but has remained stable.  Past medical history significant for tobacco use disorder, which she quit on 10/21/2019, hypertension, COPD,Stage III chronic kidney disease, history of breast cancer, bilateral venous insufficiency and has had ablation and persistent superficial varicose veins with complications and recurrent varicose vein bleeding, last occurrence 11/14/2019 needing hospitalization and blood transfusion.  No further bleeding since last blood transfusion.  Except for chronic dyspnea and significant degenerative joint disease involving her back, hands and hips she has no specific complaints today.  States that the discomfort in her joints has worsened during winter. She denies any bluish discoloration or ulceration in her feet.  No leg edema.   Past Medical History:  Diagnosis Date   Acute renal failure superimposed on stage 3 chronic kidney disease ([place]) 10/27/2014   Back pain, chronic    Breast cancer ([place]) 09/09/10   Cancer of colon ([place]) 05/24/2011   Centrilobular emphysema ([place]) 07/15/2018   CKD (chronic kidney disease), stage III (HCC)    Claudication, intermittent ([place]) 07/15/2018   Depression    GIB (gastrointestinal bleeding)    H/O tobacco use, presenting hazards to health Quit Dec 2019 05/03/2011   50 years, up to 3PPD quit last year.    Hx of varicose veins of lower extremity 07/15/2018   Hypertension    SDH (subdural  hematoma)    SOB (shortness of breath) 07/15/2018    Past Surgical History:  Procedure Laterality Date   ABDOMINAL AORTOGRAM W/LOWER EXTREMITY Left 03/18/2019   Procedure: ABDOMINAL AORTOGRAM W/LOWER EXTREMITY;  Surgeon: Adrian Prows, MD;  Location: [facility];  Service: Cardiovascular;  Laterality: Left;   BACK SURGERY     BREAST LUMPECTOMY Right 10/10/2010   COLON SURGERY  04/2011   COLONOSCOPY  05/03/2011   Procedure: COLONOSCOPY;  Surgeon: Jeryl Columbia, MD;  Location: [facility] ENDOSCOPY;  Service: Endoscopy;  Laterality: N/A;   JOINT REPLACEMENT     R&L total shoulder replacements   LUMBAR DISC SURGERY     PARTIAL HYSTERECTOMY     PERIPHERAL VASCULAR INTERVENTION  03/18/2019   Procedure: PERIPHERAL VASCULAR INTERVENTION;  Surgeon: Adrian Prows, MD;  Location: [facility];  Service: Cardiovascular;;  attempted left SFA   SHOULDER SURGERY     TUMOR REMOVAL  Tumor removed R breast   Social History   Tobacco Use   Smoking status: Former    Packs/day: 0.25    Years: 30.00    Pack years: 7.50    Types: Cigarettes    Quit date: 10/31/2019    Years since quitting: 1.6   Smokeless tobacco: Never   Tobacco comments:    had 6 cigarettes recently 03-10-20  Substance Use Topics   Alcohol use: Yes    Comment: rarely   Marital Status: Divorced   Review of Systems  Constitutional: Negative for malaise/fatigue.  Cardiovascular:  Positive for claudication (very mild) and dyspnea on exertion. Negative for chest pain, leg  swelling, orthopnea, palpitations and syncope.  Respiratory:  Positive for cough.   Musculoskeletal:  Positive for arthritis, back pain and joint swelling (Right knee).  All other systems reviewed and are negative. Objective  Blood pressure (!) 148/80, pulse 95, temperature 98 F (36.7 C), resp. rate 16, height _0  (1.473 m), weight 124 lb (56.2 kg), SpO2 99 %. Body mass index is 25.92 kg/m.  Vitals with BMI 06/10/2021 06/10/2021 03/10/2021  Height - _1  4'  10"  Weight - 124 lbs 128 lbs  BMI - 63.87 56.43  Systolic 329 518 841  Diastolic 80 82 74  Pulse - 95 80     Physical Exam Vitals reviewed.  Constitutional:      Appearance: She is well-developed.  Neck:     Vascular: No carotid bruit or JVD.  Cardiovascular:     Rate and Rhythm: Normal rate and regular rhythm. Occasional Extrasystoles are present.    Pulses:          Femoral pulses are 2+ on the right side with bruit.      Popliteal pulses are 1+ on the right side and 1+ on the left side.       Dorsalis pedis pulses are 1+ on the right side and 0 on the left side.       Posterior tibial pulses are 0 on the left side.     Heart sounds: Normal heart sounds.  Pulmonary:     Effort: Pulmonary effort is normal. No accessory muscle usage or respiratory distress.     Breath sounds: Rales (Occasional scattered) present. No wheezing.  Abdominal:     General: Bowel sounds are normal.     Palpations: Abdomen is soft.  Musculoskeletal:     Right lower leg: No edema.     Left lower leg: No edema.  Skin:    General: Skin is warm.     Capillary Refill: Capillary refill takes less than 2 seconds.     Findings: No erythema.   Radiology: No results found.  Laboratory examination:    CMP Latest Ref Rng & Units 11/09/2020 11/08/2020 11/07/2020  Glucose 70 - 99 mg/dL 153(H) 156(H) 148(H)  BUN 8 - 23 mg/dL 49(H) 37(H) 41(H)  Creatinine 0.44 - 1.00 mg/dL 1.59(H) 1.30(H) 1.59(H)  Sodium 135 - 145 mmol/L 137 139 144  Potassium 3.5 - 5.1 mmol/L 4.7 4.3 3.7  Chloride 98 - 111 mmol/L 95(L) 98 98  CO2 22 - 32 mmol/L 33(H) 33(H) 33(H)  Calcium 8.9 - 10.3 mg/dL 10.0 10.5(H) 11.0(H)  Total Protein 6.5 - 8.1 g/dL 6.6 7.3 -  Total Bilirubin 0.3 - 1.2 mg/dL 0.8 0.5 -  Alkaline Phos 38 - 126 U/L 96 107 -  AST 15 - 41 U/L 33 25 -  ALT 0 - 44 U/L 20 20 -   CBC Latest Ref Rng & Units 11/09/2020 11/08/2020 11/07/2020  WBC 4.0 - 10.5 K/uL 8.0 3.8(L) 4.5  Hemoglobin 12.0 - 15.0 g/dL 14.0 13.6 14.9   Hematocrit 36.0 - 46.0 % 41.9 41.1 46.3(H)  Platelets 150 - 400 K/uL 203 192 202   Lipid Panel     Component Value Date/Time   CHOL 132 06/12/2019 1502   TRIG 62 06/12/2019 1502   HDL 73 06/12/2019 1502   CHOLHDL 1.8 06/12/2019 1502   LDLCALC 46 06/12/2019 1502   HEMOGLOBIN A1C Lab Results  Component Value Date   HGBA1C 6.1 (H) 10/28/2014   MPG 128 10/28/2014   TSH No results  for input(s): TSH in the last 8760 hours.  External labs:  Labs 03/03/2021:  Hb 12.3/HCT 36.7, platelets 216.  Normal indicis.  Serum glucose 82 mg, BUN 28, creatinine 1.33, EGFR 42 mL, potassium 4.1, LFTs normal.  Magnesium 2.0.  Lipid profile 07/28/2020:  Total cholesterol 149, triglycerides 74, HDL 78, LDL 57.   Cardiac Studies:   Lower Extremity Arterial Duplex 01/14/2019: There is monophasic waveform noted in the right external iliac and CFA suggests proximal significant disease. There is severe diffuse mixed plaque noted throughout the right lower extremity.  Moderate velocity increase at the left distal superficial femoral artery, >50% stenosis. There is severe diffuse mixed plaque throughout the left lower extremity This exam reveals moderately decreased perfusion of the right lower extremity, noted at the dorsalis pedis artery level (ABI 0.75) and moderately decreased perfusion of the left lower extremity, noted at the post tibial artery level (ABI 0.58).  No significant change from report of 01/11/2015.   Peripheral arteriogram 03/18/2019:  Dist R EIA to Prox R CFA lesion is 60% stenosed.  Prox R CIA to Mid R EIA lesion is 20% stenosed.  Prox L CIA to Mid L CFA lesion is 20% stenosed.  Prox L SFA to Dist L SFA lesion is 100% stenosed.  Three-vessel runoff below the knee on the left with brisk flow.   Failed attempt at angioplasty of the left SFA.    Suprarenal Abd AO to Infrarenal Abd AO lesion is 30% stenosed. Severely calcified lesions.  Abdominal aortogram also reveals  patent renal arteries 1 on either side. There is no evidence of abdominal aortic aneurysm.  Echocardiogram 11/08/2020:  1. Left ventricular ejection fraction, by estimation, is 65 to 70%. The left ventricle has normal function. The left ventricle has no regional wall motion abnormalities. Left ventricular diastolic parameters are consistent with Grade I diastolic dysfunction (impaired relaxation).  2. Right ventricular systolic function is normal. The right ventricular size is normal. There is normal pulmonary artery systolic pressure.  3. Left atrial size was mildly dilated.  4. The mitral valve is grossly normal. Trivial mitral valve regurgitation.  5. The aortic valve is tricuspid. Aortic valve regurgitation is not visualized.  6. The inferior vena cava is normal in size with greater than 50% respiratory variability, suggesting right atrial pressure of 3 mmHg.  7. Small PFO suspected.  Compared to 11/26/2018, there was decreased IVC respiratory variation suggestive of elevated right heart pressure and PA pressure was mildly elevated.  PFO was not evident.  EKG:  EKG normal sinus rhythm at rate of 67 bpm with sinus arrhythmia.  Normal axis.  Right bundle branch block.  PACs. No significant change from EKG 11/16/2020.   Assessment     ICD-10-CM   1. Essential hypertension  I10 EKG 12-Lead    2. Centrilobular emphysema ([place])  J43.2     3. Chronic obstructive pulmonary disease with acute exacerbation (HCC)  J44.1     4. PAD (peripheral artery disease) (HCC)  I73.9     5. Elevated blood pressure reading with diagnosis of hypertension  I10       No orders of the defined types were placed in this encounter.  There are no discontinued medications.   Orders Placed This Encounter  Procedures   EKG 12-Lead    Recommendations:   Holly S Cipriani  is a 76 y.o. with peripheral artery disease has known long left SFA CTO with failed attempt in September 2020 for revascularization, also has  right  calcific iliac artery stenosis which was felt to be high risk due to extension into right common femoral artery for dissection. She has right hip claudication and also left leg claudication that is chronic, symptoms improved with addition of Neurontin.  Past medical history significant for tobacco use disorder, which she quit on 10/21/2019 and has remained abstinent, hypertension, COPD,Stage III chronic kidney disease, history of breast cancer, bilateral venous insufficiency and has had ablation and persistent superficial varicose veins with complications and recurrent varicose vein bleeding, last occurrence 11/14/2019 needing hospitalization and blood transfusion. No further bleeding since then fortunately.    She is presently doing well, except for chronic degenerative joint disease, chronic dyspnea, she has no specific complaints.  Physical examination is unchanged from previous, has excellent capillary refill of bilateral lower extremity.  I reviewed her external labs, CBC has remained stable, renal function also has remained stable and lipids are at goal.    I simply reassured her.  Her blood pressure was elevated today, however as it is always been fairly well controlled and she is in significant amount of pain in her back and also in her hips, blood pressure may have been elevated, they will continue to monitor this and let me know, could potentially increase amlodipine to 10 mg.  Office visit in 6 months.    Adrian Prows, MD, [facility] 06/10/2021, 1:40 PM Office: [phone]

## 2021-06-14 ENCOUNTER — Encounter: Payer: Self-pay | Admitting: Hematology

## 2021-06-14 DIAGNOSIS — J432 Centrilobular emphysema: Secondary | ICD-10-CM | POA: Diagnosis not present

## 2021-06-15 ENCOUNTER — Encounter: Payer: Self-pay | Admitting: Hematology

## 2021-06-17 DIAGNOSIS — M170 Bilateral primary osteoarthritis of knee: Secondary | ICD-10-CM | POA: Diagnosis not present

## 2021-06-17 DIAGNOSIS — M1711 Unilateral primary osteoarthritis, right knee: Secondary | ICD-10-CM | POA: Diagnosis not present

## 2021-06-21 ENCOUNTER — Encounter: Payer: Self-pay | Admitting: Internal Medicine

## 2021-06-21 ENCOUNTER — Other Ambulatory Visit: Payer: Self-pay

## 2021-06-21 ENCOUNTER — Ambulatory Visit (INDEPENDENT_AMBULATORY_CARE_PROVIDER_SITE_OTHER): Payer: Medicare Other | Admitting: Internal Medicine

## 2021-06-21 VITALS — BP 136/76 | HR 79 | Temp 97.6°F | Ht <= 58 in | Wt 132.0 lb

## 2021-06-21 DIAGNOSIS — J449 Chronic obstructive pulmonary disease, unspecified: Secondary | ICD-10-CM

## 2021-06-21 DIAGNOSIS — J9611 Chronic respiratory failure with hypoxia: Secondary | ICD-10-CM | POA: Diagnosis not present

## 2021-06-21 NOTE — Patient Instructions (Signed)
Please schedule follow up scheduled with myself in 3 months.  If my schedule is not open yet, we will contact you with a reminder closer to that time. Please call [phone] if you haven't heard from [name] a month before.   We will fax over the forms for a motorized scooter for you.  We will prescribe [employer] as your new DME company to get you switched over.  Call me if any issues with your breathig.

## 2021-06-21 NOTE — Progress Notes (Signed)
[name]    [id number]    11/11/1945  Primary Care Physician:Scifres, Durel Salts Date of Appointment: 06/22/2021 Established Patient Visit  Chief complaint:   Chief Complaint  Patient presents with   Follow-up    SOB with anxiety      HPI: [name] is a 76 y.o. woman with emphysema on 3LNC continuously who presents with shortness of breath.  Interval Updates: Here for follow up for oxygen recertification and COPD follow up. No hospitalizations or ED visits. She did have a walker but was unable to lift it. She is unable to propel herself forward in a wheelchair due to limited upper body strength from severe kyphoscoliosis.   She has difficulty with shortness of breath making meals, daily activities of living around her house.   She did receive her walker but couldn't life the walker so she returned it.   She is here with her friend today. She is tearful and having difficulty coming to terms with her limited independence. She has large oxygen tanks at home but has difficulty lifting them. She    I have reviewed the patient's family social and past medical history and updated as appropriate.   Past Medical History:  Diagnosis Date   Acute renal failure superimposed on stage 3 chronic kidney disease ([place]) 10/27/2014   Back pain, chronic    Breast cancer ([place]) 09/09/10   Cancer of colon ([place]) 05/24/2011   Centrilobular emphysema ([place]) 07/15/2018   CKD (chronic kidney disease), stage III (HCC)    Claudication, intermittent ([place]) 07/15/2018   Depression    GIB (gastrointestinal bleeding)    H/O tobacco use, presenting hazards to health Quit Dec 2019 05/03/2011   50 years, up to 3PPD quit last year.    Hx of varicose veins of lower extremity 07/15/2018   Hypertension    SDH (subdural hematoma)    SOB (shortness of breath) 07/15/2018    Past Surgical History:  Procedure Laterality Date   ABDOMINAL AORTOGRAM W/LOWER EXTREMITY Left 03/18/2019    Procedure: ABDOMINAL AORTOGRAM W/LOWER EXTREMITY;  Surgeon: Adrian Prows, MD;  Location: [place] [facility];  Service: Cardiovascular;  Laterality: Left;   BACK SURGERY     BREAST LUMPECTOMY Right 10/10/2010   COLON SURGERY  04/2011   COLONOSCOPY  05/03/2011   Procedure: COLONOSCOPY;  Surgeon: Jeryl Columbia, MD;  Location: [facility];  Service: Endoscopy;  Laterality: N/A;   JOINT REPLACEMENT     R&L total shoulder replacements   LUMBAR DISC SURGERY     PARTIAL HYSTERECTOMY     PERIPHERAL VASCULAR INTERVENTION  03/18/2019   Procedure: PERIPHERAL VASCULAR INTERVENTION;  Surgeon: Adrian Prows, MD;  Location: [place] [facility];  Service: Cardiovascular;;  attempted left SFA   SHOULDER SURGERY     TUMOR REMOVAL  Tumor removed R breast    Family History  Problem Relation Age of Onset   Diabetes Mother    Hypertension Mother    Cancer Mother        leukemia   Sudden death Mother    Heart attack Mother    Anesthesia problems Neg Hx    Hypotension Neg Hx    Malignant hyperthermia Neg Hx    Pseudochol deficiency Neg Hx    Breast cancer Neg Hx    Lung disease Neg Hx     Social History   Occupational History   Not on file  Tobacco Use   Smoking status:  Former    Packs/day: 0.25    Years: 30.00    Pack years: 7.50    Types: Cigarettes    Quit date: 10/31/2019    Years since quitting: 1.6   Smokeless tobacco: Never   Tobacco comments:    had 6 cigarettes recently 03-10-20  Vaping Use   Vaping Use: Never used  Substance and Sexual Activity   Alcohol use: Yes    Comment: rarely    Drug use: No   Sexual activity: Not Currently     Physical Exam: Blood pressure 136/76, pulse 79, temperature 97.6 F (36.4 C), temperature source Oral, height 4\' 10"  (1.473 m), weight 132 lb (59.9 kg), SpO2 92 %.  Gen:      No acute distress, elderly, chronically ill Lungs:    severe kyphosis and scoliosis CV:         RRR no mrg Ext:   [place]'s nodes consistent with osteoarthritis, thin  extremities, decreased muscle bulk and tone MSK: no rashes Neuro: decreased strength in bilateral upper and lower extremities.   Data Reviewed: Imaging: I have personally reviewed the chest xrays June 2022 chronic interstitial prominence, no acute process  PFTs:   PFT Results Latest Ref Rng & Units 04/08/2020  FVC-Pre L 1.37  FVC-Predicted Pre % 56  FVC-Post L 1.70  FVC-Predicted Post % 70  Pre FEV1/FVC % % 70  Post FEV1/FCV % % 57  FEV1-Pre L 0.96  FEV1-Predicted Pre % 53  FEV1-Post L 0.96  DLCO uncorrected ml/min/mmHg 9.26  DLCO UNC% % 55  DLCO corrected ml/min/mmHg 9.26  DLCO COR %Predicted % 55  DLVA Predicted % 65  TLC L 4.47  TLC % Predicted % 100  RV % Predicted % 121   I have personally reviewed the patient's PFTs and they show moderately severe airflow limitation with a positive bronchodilator response. There is reduced diffusion capacity as well.   Labs:  Immunization status: Immunization History  Administered Date(s) Administered   PFIZER(Purple Top)SARS-COV-2 Vaccination 08/15/2019, 08/26/2019    Assessment:  Moderately Severe Emphysema FEV 54% of predicted Severe kyphosis Chronic Respiratory Failure on 3LNC continuously.  Some day tobacco use disorder Severe osteoarthritis  Plan/Recommendations:  [name] continues to want to stay off all inhaler therapy.  She continues to smoke occasionally.    Pt was able to complete 1 lap at slow pace with 1 rest stop needed. Pt's O2 sats did drop to 86% on lap 0.5 lap but recovered to 93% with application of 4L cont. flow O2   Walker not appropriate due to chronic back pain and decreased upper extremity strength  Order for Motorized Wheelchair: Patient suffers from COPD, emphysema, kyphoscoliosis, respiratory failure, osteoarthritis which impairs their ability to perform daily activities like bathing, dressing, feeding, grooming, and toileting in the home.  A walker will not resolve  issue with performing  activities of daily living. A wheelchair will allow patient to safely perform daily activities. Patient is not able to propel themselves in the home using a standard weight due to arm weakness, general weakness, endurance.  Patient can self propel in a motorizwed wheelchair.   I spent 31 minutes in the care of this patient today including pre-charting, chart review, review of results, face-to-face care, coordination of care and communication with consultants etc.).   Return to Care: Return in about 3 months (around 09/18/2021).   Lenice Llamas, MD Pulmonary and [place]

## 2021-06-22 ENCOUNTER — Encounter: Payer: Self-pay | Admitting: Internal Medicine

## 2021-06-22 ENCOUNTER — Ambulatory Visit
Admission: RE | Admit: 2021-06-22 | Discharge: 2021-06-22 | Disposition: A | Discharge status: Home / Self Care | Payer: Medicare Other | Source: Ambulatory Visit | Attending: Cardiology | Admitting: Cardiology

## 2021-06-22 ENCOUNTER — Ambulatory Visit: Payer: Commercial Managed Care - HMO

## 2021-06-22 ENCOUNTER — Encounter: Payer: Self-pay | Admitting: Hematology

## 2021-06-22 DIAGNOSIS — N644 Mastodynia: Secondary | ICD-10-CM | POA: Diagnosis not present

## 2021-06-22 DIAGNOSIS — R922 Inconclusive mammogram: Secondary | ICD-10-CM | POA: Diagnosis not present

## 2021-06-24 DIAGNOSIS — J449 Chronic obstructive pulmonary disease, unspecified: Secondary | ICD-10-CM | POA: Diagnosis not present

## 2021-07-04 ENCOUNTER — Encounter: Payer: Self-pay | Admitting: Emergency Medicine

## 2021-07-04 ENCOUNTER — Ambulatory Visit
Admission: EM | Admit: 2021-07-04 | Discharge: 2021-07-04 | Disposition: A | Discharge status: Home / Self Care | Payer: Medicare Other | Attending: Internal Medicine | Admitting: Internal Medicine

## 2021-07-04 ENCOUNTER — Other Ambulatory Visit: Payer: Self-pay

## 2021-07-04 ENCOUNTER — Ambulatory Visit (INDEPENDENT_AMBULATORY_CARE_PROVIDER_SITE_OTHER): Payer: Medicare Other

## 2021-07-04 DIAGNOSIS — W19XXXA Unspecified fall, initial encounter: Secondary | ICD-10-CM | POA: Diagnosis not present

## 2021-07-04 DIAGNOSIS — R600 Localized edema: Secondary | ICD-10-CM | POA: Diagnosis not present

## 2021-07-04 DIAGNOSIS — M25571 Pain in right ankle and joints of right foot: Secondary | ICD-10-CM

## 2021-07-04 DIAGNOSIS — S92351A Displaced fracture of fifth metatarsal bone, right foot, initial encounter for closed fracture: Secondary | ICD-10-CM

## 2021-07-04 DIAGNOSIS — M7989 Other specified soft tissue disorders: Secondary | ICD-10-CM | POA: Diagnosis not present

## 2021-07-04 NOTE — ED Triage Notes (Signed)
Slipped twice last night, injured right foot and ankle. Also has been off her O2 for a few hours, is usually on 4L at home. Placed her on it in triage. Bruising and swelling visible to right lateral foot, pain in right foot and ankle.

## 2021-07-04 NOTE — ED Provider Notes (Signed)
[name]    CSN: [id number] Arrival date & time: 07/04/21  1725      History   Chief Complaint Chief Complaint  Patient presents with   Foot Injury    HPI [name] is a 76 y.o. female.   Patient here today for evaluation of right foot and ankle pain after she slipped twice last night.  She reports most of her pain is present to the right lateral area of her foot.  Weightbearing makes pain worse.  She reports that she has numbness at baseline but no new numbness.  The history is provided by the patient.  Foot Injury Associated symptoms: no fever    Past Medical History:  Diagnosis Date   Acute renal failure superimposed on stage 3 chronic kidney disease ([name]) 10/27/2014   Back pain, chronic    Breast cancer ([name]) 09/09/10   Cancer of colon ([name]) 05/24/2011   Centrilobular emphysema ([name]) 07/15/2018   CKD (chronic kidney disease), stage III ([name])    Claudication, intermittent ([name]) 07/15/2018   Depression    GIB (gastrointestinal bleeding)    H/O tobacco use, presenting hazards to health Quit Dec 2019 05/03/2011   50 years, up to 3PPD quit last year.    Hx of varicose veins of lower extremity 07/15/2018   Hypertension    SDH (subdural hematoma)    SOB (shortness of breath) 07/15/2018    Patient Active Problem List   Diagnosis Date Noted   COPD with acute exacerbation ([place]) 11/08/2020   Elevated troponin 11/08/2020   CKD (chronic kidney disease) stage 3, GFR 30-59 ml/min ([name]) 11/08/2020   Acute blood loss anemia 11/14/2019   Shock ([name]) 06/22/2019   Shock circulatory ([name]) 06/21/2019   Ruptured varicose vein 06/21/2019   COPD (chronic obstructive pulmonary disease) ([place]) 06/21/2019   Chronic diastolic CHF (congestive heart failure) ([name]) 06/21/2019   SOB (shortness of breath) 07/15/2018   Laboratory examination 07/15/2018   Claudication, intermittent ([place]) 07/15/2018   Centrilobular emphysema ([name]) 07/15/2018   Hx of varicose veins of lower  extremity 07/15/2018   Back pain 01/21/2016   Sacroiliac joint dysfunction of left side 12/27/2015   History of colon cancer 09/21/2015   Iron deficiency anemia 06/04/2015   Syncope 11/05/2014   Narcotic addiction ([name])    Chronic pain 10/27/2014   Faintness    Frequent falls 08/22/2014   Facial contusion 08/22/2014   Subdural hematoma 08/21/2014   Acute kidney injury superimposed on CKD ([name]) 01/20/2013   Acute mastitis of right breast 04/05/2012   Right shoulder pain 03/27/2012   Breast erythema most likely secondary to mastitis / cellulitis 03/24/2012   Hyponatremia 03/24/2012   Cancer of colon ([name]) 05/24/2011   Cancer of right breast, stage 1 ([name]) 05/24/2011   Colonic mass 05/03/2011   H/O tobacco use, presenting hazards to health 05/03/2011   Weakness generalized 05/01/2011   Anemia 05/01/2011   Falls frequently 05/01/2011   GI bleed 05/01/2011   Hypokalemia 05/01/2011   HTN (hypertension) 05/01/2011   Anxiety and depression 05/01/2011   Lower extremity edema 05/01/2011    Past Surgical History:  Procedure Laterality Date   ABDOMINAL AORTOGRAM W/LOWER EXTREMITY Left 03/18/2019   Procedure: ABDOMINAL AORTOGRAM W/LOWER EXTREMITY;  Surgeon: Adrian Prows, MD;  Location: [place] [facility];  Service: Cardiovascular;  Laterality: Left;   BACK SURGERY     BREAST LUMPECTOMY Right 10/10/2010   COLON SURGERY  04/2011   COLONOSCOPY  05/03/2011   Procedure: COLONOSCOPY;  Surgeon: Jeryl Columbia, MD;  Location: [facility];  Service: Endoscopy;  Laterality: N/A;   JOINT REPLACEMENT     R&L total shoulder replacements   LUMBAR DISC SURGERY     PARTIAL HYSTERECTOMY     PERIPHERAL VASCULAR INTERVENTION  03/18/2019   Procedure: PERIPHERAL VASCULAR INTERVENTION;  Surgeon: Adrian Prows, MD;  Location: [place] [facility];  Service: Cardiovascular;;  attempted left SFA   SHOULDER SURGERY     TUMOR REMOVAL  Tumor removed R breast    OB History   No obstetric history on file.       Home Medications    Prior to Admission medications   Medication Sig Start Date End Date Taking? Authorizing Provider  acetaminophen (TYLENOL) 325 MG tablet Take 2 tablets (650 mg total) by mouth every 6 (six) hours as needed for mild pain (or Fever >/= 101). Patient not taking: Reported on 06/21/2021 11/09/20   Raiford Noble Latif, DO  ALPRAZolam Duanne Moron) 1 MG tablet Take 1 tablet (1 mg total) by mouth 2 (two) times daily as needed. 11/09/20   Raiford Noble Latif, DO  amLODipine (NORVASC) 5 MG tablet Take 1 tablet (5 mg total) by mouth daily. 01/31/21   Adrian Prows, MD  aspirin EC 81 MG tablet Take 81 mg by mouth daily. Swallow whole.    [provider]  atorvastatin (LIPITOR) 10 MG tablet TAKE 1 TABLET BY MOUTH EVERY DAY 02/14/21   Adrian Prows, MD  buPROPion (WELLBUTRIN XL) 150 MG 24 hr tablet Take 150 mg by mouth daily.    [provider]  furosemide (LASIX) 20 MG tablet Take 1 tablet (20 mg total) by mouth daily. 02/24/21   Adrian Prows, MD  gabapentin (NEURONTIN) 100 MG capsule Take 300 mg by mouth 3 (three) times daily. 04/01/19   [provider]  guaiFENesin (MUCINEX) 600 MG 12 hr tablet Take 600 mg by mouth 2 (two) times daily. Patient not taking: Reported on 06/21/2021    [provider]  losartan-hydrochlorothiazide (HYZAAR) 100-12.5 MG tablet TAKE 1 TABLET BY MOUTH EVERY DAY Patient not taking: Reported on 06/21/2021 05/11/21   Adrian Prows, MD  mirtazapine (REMERON) 30 MG tablet 30 mg daily.  04/22/16   [provider]  Multiple Vitamin (MULTIVITAMIN WITH MINERALS) TABS tablet Take 1 tablet by mouth daily.    [provider]  Multiple Vitamins-Minerals (MULTI FOR HER 50+) TABS Take 1 tablet by mouth daily.    [provider]  oxyCODONE (OXYCONTIN) 60 MG 12 hr tablet Take 1 tablet by mouth every 8 (eight) hours.    [provider]  potassium chloride (KLOR-CON) 10 MEQ tablet TAKE 1 TABLET (10 MEQ TOTAL) BY MOUTH DAILY.  TAKE WITH LASIX 05/30/21   Adrian Prows, MD  rOPINIRole (REQUIP) 1 MG tablet Take 1 mg by mouth at bedtime.    [provider]    Family History Family History  Problem Relation Age of Onset   Diabetes Mother    Hypertension Mother    Cancer Mother        leukemia   Sudden death Mother    Heart attack Mother    Anesthesia problems Neg Hx    Hypotension Neg Hx    Malignant hyperthermia Neg Hx    Pseudochol deficiency Neg Hx    Breast cancer Neg Hx    Lung disease Neg Hx     Social History Social History   Tobacco Use   Smoking status: Former    Packs/day:  0.25    Years: 30.00    Pack years: 7.50    Types: Cigarettes    Quit date: 10/31/2019    Years since quitting: 1.6   Smokeless tobacco: Never   Tobacco comments:    had 6 cigarettes recently 03-10-20  Vaping Use   Vaping Use: Never used  Substance Use Topics   Alcohol use: Yes    Comment: rarely    Drug use: No     Allergies   Patient has no known allergies.   Review of Systems Review of Systems  Constitutional:  Negative for chills and fever.  Eyes:  Negative for discharge and redness.  Gastrointestinal:  Negative for abdominal pain, nausea and vomiting.  Musculoskeletal:  Positive for arthralgias and joint swelling.  Skin:  Positive for color change.  Neurological:  Positive for numbness.    Physical Exam Triage Vital Signs ED Triage Vitals [07/04/21 1753]  Enc Vitals Group     BP (!) 173/80     Pulse Rate 72     Resp 16     Temp 98.4 F (36.9 C)     Temp Source Oral     SpO2 (!) 83 %     Weight      Height      Head Circumference      Peak Flow      Pain Score 9     Pain Loc      Pain Edu?      Excl. in [place]?    No data found.  Updated Vital Signs BP (!) 173/80 (BP Location: Left Arm)    Pulse 72    Temp 98.4 F (36.9 C) (Oral)    Resp 16    SpO2 (!) 83% Comment: Has been off O2 for 3 hours, placed on O2, came up to 96     Physical Exam Vitals and nursing note reviewed.   Constitutional:      General: She is not in acute distress.    Appearance: Normal appearance. She is not ill-appearing.  HENT:     Head: Normocephalic and atraumatic.  Eyes:     Conjunctiva/sclera: Conjunctivae normal.  Cardiovascular:     Rate and Rhythm: Normal rate.  Pulmonary:     Effort: Pulmonary effort is normal.  Musculoskeletal:     Comments: Mild swelling and erythema with TTP noted to lateral right foot  Neurological:     Mental Status: She is alert.  Psychiatric:        Mood and Affect: Mood normal.        Behavior: Behavior normal.        Thought Content: Thought content normal.     UC Treatments / Results  Labs (all labs ordered are listed, but only abnormal results are displayed) Labs Reviewed - No data to display  EKG   Radiology DG Ankle Complete Right  Result Date: 07/04/2021 CLINICAL DATA:  [place] twice yesterday, bruising and swelling, lateral foot and ankle pain EXAM: RIGHT FOOT COMPLETE - 3+ VIEW; RIGHT ANKLE - COMPLETE 3+ VIEW COMPARISON:  08/16/2016 FINDINGS: Right ankle: Frontal, oblique, lateral views are obtained. There is marked osteopenia. Transverse fracture at the base of the fifth metatarsal is partially visualized. No other acute bony abnormalities. Joint spaces are relatively well preserved. Prominent superior and inferior calcaneal spurs. Mild diffuse soft tissue edema. Right foot: Frontal, oblique, lateral views demonstrate a minimally displaced transverse fracture at the base of the fifth metatarsal, with near anatomic alignment. No  other acute bony abnormalities. The bones are diffusely osteopenic. Joint spaces are well preserved. Mild diffuse soft tissue swelling. IMPRESSION: 1. Minimally displaced transverse fracture at the base of the fifth metatarsal. 2. Diffuse soft tissue swelling. 3. Severe osteopenia. Electronically Signed   By: Randa Ngo M.D.   On: 07/04/2021 18:30   DG Foot Complete Right  Result Date: 07/04/2021 CLINICAL DATA:   [place] twice yesterday, bruising and swelling, lateral foot and ankle pain EXAM: RIGHT FOOT COMPLETE - 3+ VIEW; RIGHT ANKLE - COMPLETE 3+ VIEW COMPARISON:  08/16/2016 FINDINGS: Right ankle: Frontal, oblique, lateral views are obtained. There is marked osteopenia. Transverse fracture at the base of the fifth metatarsal is partially visualized. No other acute bony abnormalities. Joint spaces are relatively well preserved. Prominent superior and inferior calcaneal spurs. Mild diffuse soft tissue edema. Right foot: Frontal, oblique, lateral views demonstrate a minimally displaced transverse fracture at the base of the fifth metatarsal, with near anatomic alignment. No other acute bony abnormalities. The bones are diffusely osteopenic. Joint spaces are well preserved. Mild diffuse soft tissue swelling. IMPRESSION: 1. Minimally displaced transverse fracture at the base of the fifth metatarsal. 2. Diffuse soft tissue swelling. 3. Severe osteopenia. Electronically Signed   By: Randa Ngo M.D.   On: 07/04/2021 18:30    Procedures Procedures (including critical care time)  Medications Ordered in UC Medications - No data to display  Initial Impression / Assessment and Plan / UC Course  I have reviewed the triage vital signs and the nursing notes.  Pertinent labs & imaging results that were available during my care of the patient were reviewed by me and considered in my medical decision making (see chart for details).    Fracture noted to 5th metatarsal, walking boot provided in office and recommended follow up with ortho tomorrow. Patient and daughter who is here with her is agreeable to same.   Final Clinical Impressions(s) / UC Diagnoses   Final diagnoses:  Displaced fracture of fifth metatarsal bone, right foot, initial encounter for closed fracture   Discharge Instructions   None    ED Prescriptions   None    PDMP not reviewed this encounter.   Francene Finders, PA-C 07/05/21 [phone]

## 2021-07-07 DIAGNOSIS — S92354A Nondisplaced fracture of fifth metatarsal bone, right foot, initial encounter for closed fracture: Secondary | ICD-10-CM | POA: Diagnosis not present

## 2021-07-07 DIAGNOSIS — M79671 Pain in right foot: Secondary | ICD-10-CM | POA: Diagnosis not present

## 2021-07-07 DIAGNOSIS — S92344A Nondisplaced fracture of fourth metatarsal bone, right foot, initial encounter for closed fracture: Secondary | ICD-10-CM | POA: Diagnosis not present

## 2021-07-14 DIAGNOSIS — M1711 Unilateral primary osteoarthritis, right knee: Secondary | ICD-10-CM | POA: Diagnosis not present

## 2021-07-15 DIAGNOSIS — J432 Centrilobular emphysema: Secondary | ICD-10-CM | POA: Diagnosis not present

## 2021-07-21 DIAGNOSIS — M1711 Unilateral primary osteoarthritis, right knee: Secondary | ICD-10-CM | POA: Diagnosis not present

## 2021-07-22 DIAGNOSIS — J449 Chronic obstructive pulmonary disease, unspecified: Secondary | ICD-10-CM | POA: Diagnosis not present

## 2021-07-26 ENCOUNTER — Telehealth: Payer: Self-pay | Admitting: Internal Medicine

## 2021-07-27 NOTE — Telephone Encounter (Signed)
[facility] but she did not answer. Left message for her to call us back.  ? ?I was able to find a copy of the written order and evaluation forms that had been scanned into her chart. Will go ahead and fax these to [place].  ? ?Will leave this encounter for follow up.  ?

## 2021-07-28 DIAGNOSIS — M1711 Unilateral primary osteoarthritis, right knee: Secondary | ICD-10-CM | POA: Diagnosis not present

## 2021-08-09 DIAGNOSIS — M961 Postlaminectomy syndrome, not elsewhere classified: Secondary | ICD-10-CM | POA: Diagnosis not present

## 2021-08-09 DIAGNOSIS — Z79891 Long term (current) use of opiate analgesic: Secondary | ICD-10-CM | POA: Diagnosis not present

## 2021-08-09 DIAGNOSIS — M47816 Spondylosis without myelopathy or radiculopathy, lumbar region: Secondary | ICD-10-CM | POA: Diagnosis not present

## 2021-08-09 DIAGNOSIS — G894 Chronic pain syndrome: Secondary | ICD-10-CM | POA: Diagnosis not present

## 2021-08-12 DIAGNOSIS — J432 Centrilobular emphysema: Secondary | ICD-10-CM | POA: Diagnosis not present

## 2021-08-18 DIAGNOSIS — S92344A Nondisplaced fracture of fourth metatarsal bone, right foot, initial encounter for closed fracture: Secondary | ICD-10-CM | POA: Diagnosis not present

## 2021-08-18 DIAGNOSIS — S92354A Nondisplaced fracture of fifth metatarsal bone, right foot, initial encounter for closed fracture: Secondary | ICD-10-CM | POA: Diagnosis not present

## 2021-08-22 DIAGNOSIS — J449 Chronic obstructive pulmonary disease, unspecified: Secondary | ICD-10-CM | POA: Diagnosis not present

## 2021-09-07 DIAGNOSIS — M150 Primary generalized (osteo)arthritis: Secondary | ICD-10-CM | POA: Diagnosis not present

## 2021-09-07 DIAGNOSIS — M4155 Other secondary scoliosis, thoracolumbar region: Secondary | ICD-10-CM | POA: Diagnosis not present

## 2021-09-07 DIAGNOSIS — M961 Postlaminectomy syndrome, not elsewhere classified: Secondary | ICD-10-CM | POA: Diagnosis not present

## 2021-09-07 DIAGNOSIS — G894 Chronic pain syndrome: Secondary | ICD-10-CM | POA: Diagnosis not present

## 2021-09-07 DIAGNOSIS — M47816 Spondylosis without myelopathy or radiculopathy, lumbar region: Secondary | ICD-10-CM | POA: Diagnosis not present

## 2021-09-07 DIAGNOSIS — Z79891 Long term (current) use of opiate analgesic: Secondary | ICD-10-CM | POA: Diagnosis not present

## 2021-09-12 DIAGNOSIS — J432 Centrilobular emphysema: Secondary | ICD-10-CM | POA: Diagnosis not present

## 2021-09-18 ENCOUNTER — Other Ambulatory Visit: Payer: Self-pay

## 2021-09-18 ENCOUNTER — Emergency Department (HOSPITAL_COMMUNITY): Payer: Medicare Other

## 2021-09-18 ENCOUNTER — Inpatient Hospital Stay (HOSPITAL_COMMUNITY)
Admission: EM | Admit: 2021-09-18 | Discharge: 2021-09-23 | DRG: 378 | Disposition: A | Discharge status: Home / Self Care | Payer: Medicare Other | Attending: Internal Medicine | Admitting: Internal Medicine

## 2021-09-18 ENCOUNTER — Encounter (HOSPITAL_COMMUNITY): Payer: Self-pay | Admitting: Oncology

## 2021-09-18 DIAGNOSIS — I739 Peripheral vascular disease, unspecified: Secondary | ICD-10-CM | POA: Diagnosis not present

## 2021-09-18 DIAGNOSIS — I130 Hypertensive heart and chronic kidney disease with heart failure and stage 1 through stage 4 chronic kidney disease, or unspecified chronic kidney disease: Secondary | ICD-10-CM | POA: Diagnosis not present

## 2021-09-18 DIAGNOSIS — J432 Centrilobular emphysema: Secondary | ICD-10-CM | POA: Diagnosis not present

## 2021-09-18 DIAGNOSIS — K449 Diaphragmatic hernia without obstruction or gangrene: Secondary | ICD-10-CM | POA: Diagnosis not present

## 2021-09-18 DIAGNOSIS — E538 Deficiency of other specified B group vitamins: Secondary | ICD-10-CM | POA: Diagnosis not present

## 2021-09-18 DIAGNOSIS — K922 Gastrointestinal hemorrhage, unspecified: Secondary | ICD-10-CM | POA: Diagnosis present

## 2021-09-18 DIAGNOSIS — N1832 Chronic kidney disease, stage 3b: Secondary | ICD-10-CM | POA: Diagnosis not present

## 2021-09-18 DIAGNOSIS — M549 Dorsalgia, unspecified: Secondary | ICD-10-CM | POA: Diagnosis not present

## 2021-09-18 DIAGNOSIS — F32A Depression, unspecified: Secondary | ICD-10-CM | POA: Diagnosis not present

## 2021-09-18 DIAGNOSIS — K319 Disease of stomach and duodenum, unspecified: Secondary | ICD-10-CM | POA: Diagnosis present

## 2021-09-18 DIAGNOSIS — Z85038 Personal history of other malignant neoplasm of large intestine: Secondary | ICD-10-CM | POA: Diagnosis not present

## 2021-09-18 DIAGNOSIS — G2581 Restless legs syndrome: Secondary | ICD-10-CM

## 2021-09-18 DIAGNOSIS — Z8249 Family history of ischemic heart disease and other diseases of the circulatory system: Secondary | ICD-10-CM

## 2021-09-18 DIAGNOSIS — Z9981 Dependence on supplemental oxygen: Secondary | ICD-10-CM

## 2021-09-18 DIAGNOSIS — Z9049 Acquired absence of other specified parts of digestive tract: Secondary | ICD-10-CM | POA: Diagnosis not present

## 2021-09-18 DIAGNOSIS — D62 Acute posthemorrhagic anemia: Secondary | ICD-10-CM | POA: Diagnosis present

## 2021-09-18 DIAGNOSIS — Z7982 Long term (current) use of aspirin: Secondary | ICD-10-CM

## 2021-09-18 DIAGNOSIS — F419 Anxiety disorder, unspecified: Secondary | ICD-10-CM | POA: Diagnosis present

## 2021-09-18 DIAGNOSIS — R109 Unspecified abdominal pain: Secondary | ICD-10-CM | POA: Diagnosis not present

## 2021-09-18 DIAGNOSIS — D649 Anemia, unspecified: Secondary | ICD-10-CM | POA: Diagnosis not present

## 2021-09-18 DIAGNOSIS — I5032 Chronic diastolic (congestive) heart failure: Secondary | ICD-10-CM | POA: Diagnosis not present

## 2021-09-18 DIAGNOSIS — Z833 Family history of diabetes mellitus: Secondary | ICD-10-CM | POA: Diagnosis not present

## 2021-09-18 DIAGNOSIS — Z853 Personal history of malignant neoplasm of breast: Secondary | ICD-10-CM | POA: Diagnosis not present

## 2021-09-18 DIAGNOSIS — Z79899 Other long term (current) drug therapy: Secondary | ICD-10-CM | POA: Diagnosis not present

## 2021-09-18 DIAGNOSIS — K2901 Acute gastritis with bleeding: Secondary | ICD-10-CM | POA: Diagnosis not present

## 2021-09-18 DIAGNOSIS — G8929 Other chronic pain: Secondary | ICD-10-CM | POA: Diagnosis not present

## 2021-09-18 DIAGNOSIS — K5903 Drug induced constipation: Secondary | ICD-10-CM | POA: Diagnosis not present

## 2021-09-18 DIAGNOSIS — K254 Chronic or unspecified gastric ulcer with hemorrhage: Principal | ICD-10-CM | POA: Diagnosis present

## 2021-09-18 DIAGNOSIS — Z87891 Personal history of nicotine dependence: Secondary | ICD-10-CM

## 2021-09-18 DIAGNOSIS — R110 Nausea: Secondary | ICD-10-CM

## 2021-09-18 DIAGNOSIS — D509 Iron deficiency anemia, unspecified: Secondary | ICD-10-CM | POA: Diagnosis not present

## 2021-09-18 DIAGNOSIS — R1013 Epigastric pain: Secondary | ICD-10-CM | POA: Diagnosis not present

## 2021-09-18 DIAGNOSIS — J9611 Chronic respiratory failure with hypoxia: Secondary | ICD-10-CM | POA: Diagnosis not present

## 2021-09-18 DIAGNOSIS — K2970 Gastritis, unspecified, without bleeding: Secondary | ICD-10-CM | POA: Diagnosis present

## 2021-09-18 DIAGNOSIS — R000 Tachycardia, unspecified: Secondary | ICD-10-CM | POA: Diagnosis not present

## 2021-09-18 DIAGNOSIS — I10 Essential (primary) hypertension: Secondary | ICD-10-CM | POA: Diagnosis present

## 2021-09-18 DIAGNOSIS — J449 Chronic obstructive pulmonary disease, unspecified: Secondary | ICD-10-CM | POA: Diagnosis present

## 2021-09-18 DIAGNOSIS — D519 Vitamin B12 deficiency anemia, unspecified: Secondary | ICD-10-CM | POA: Diagnosis not present

## 2021-09-18 DIAGNOSIS — R1310 Dysphagia, unspecified: Secondary | ICD-10-CM

## 2021-09-18 DIAGNOSIS — R400 Somnolence: Secondary | ICD-10-CM

## 2021-09-18 DIAGNOSIS — Z66 Do not resuscitate: Secondary | ICD-10-CM | POA: Diagnosis present

## 2021-09-18 DIAGNOSIS — T402X5A Adverse effect of other opioids, initial encounter: Secondary | ICD-10-CM | POA: Diagnosis not present

## 2021-09-18 DIAGNOSIS — G894 Chronic pain syndrome: Secondary | ICD-10-CM | POA: Diagnosis not present

## 2021-09-18 LAB — COMPREHENSIVE METABOLIC PANEL
ALT: 19 U/L (ref 0–44)
AST: 28 U/L (ref 15–41)
Albumin: 3.7 g/dL (ref 3.5–5.0)
Alkaline Phosphatase: 64 U/L (ref 38–126)
Anion gap: 7 (ref 5–15)
BUN: 92 mg/dL — ABNORMAL HIGH (ref 8–23)
CO2: 28 mmol/L (ref 22–32)
Calcium: 10.4 mg/dL — ABNORMAL HIGH (ref 8.9–10.3)
Chloride: 105 mmol/L (ref 98–111)
Creatinine, Ser: 1.3 mg/dL — ABNORMAL HIGH (ref 0.44–1.00)
GFR, Estimated: 43 mL/min — ABNORMAL LOW (ref >60)
Glucose, Bld: 123 mg/dL — ABNORMAL HIGH (ref 70–99)
Potassium: 4 mmol/L (ref 3.5–5.1)
Sodium: 140 mmol/L (ref 135–145)
Total Bilirubin: 0.7 mg/dL (ref 0.3–1.2)
Total Protein: 6.3 g/dL — ABNORMAL LOW (ref 6.5–8.1)

## 2021-09-18 LAB — CBC
HCT: 30 % — ABNORMAL LOW (ref 36.0–46.0)
Hemoglobin: 9.8 g/dL — ABNORMAL LOW (ref 12.0–15.0)
MCH: 31.6 pg (ref 26.0–34.0)
MCHC: 32.7 g/dL (ref 30.0–36.0)
MCV: 96.8 fL (ref 80.0–100.0)
Platelets: 239 10*3/uL (ref 150–400)
RBC: 3.1 MIL/uL — ABNORMAL LOW (ref 3.87–5.11)
RDW: 12.5 % (ref 11.5–15.5)
WBC: 5.5 10*3/uL (ref 4.0–10.5)
nRBC: 0 % (ref 0.0–0.2)

## 2021-09-18 LAB — URINALYSIS, ROUTINE W REFLEX MICROSCOPIC
Bacteria, UA: NONE SEEN
Bilirubin Urine: NEGATIVE
Glucose, UA: NEGATIVE mg/dL
Hgb urine dipstick: NEGATIVE
Ketones, ur: NEGATIVE mg/dL
Leukocytes,Ua: NEGATIVE
Nitrite: NEGATIVE
Protein, ur: NEGATIVE mg/dL
Specific Gravity, Urine: 1.018 (ref 1.005–1.030)
pH: 6 (ref 5.0–8.0)

## 2021-09-18 LAB — RETICULOCYTES
Immature Retic Fract: 9.1 % (ref 2.3–15.9)
RBC.: 3.08 MIL/uL — ABNORMAL LOW (ref 3.87–5.11)
Retic Count, Absolute: 47.7 10*3/uL (ref 19.0–186.0)
Retic Ct Pct: 1.6 % (ref 0.4–3.1)

## 2021-09-18 LAB — POC OCCULT BLOOD, ED: Fecal Occult Bld: POSITIVE — AB

## 2021-09-18 LAB — LIPASE, BLOOD: Lipase: 23 U/L (ref 11–51)

## 2021-09-18 MED ORDER — SODIUM CHLORIDE 0.9 % IV BOLUS
1000.0000 mL | Freq: Once | INTRAVENOUS | Status: AC
Start: 2021-09-18 — End: 2021-09-19
  Administered 2021-09-18: 1000 mL via INTRAVENOUS

## 2021-09-18 MED ORDER — ACETAMINOPHEN 650 MG RE SUPP: 650.0000 mg | Freq: Four times a day (QID) | RECTAL | Status: DC | PRN

## 2021-09-18 MED ORDER — LACTATED RINGERS IV SOLN
INTRAVENOUS | Status: AC
Start: 2021-09-18 — End: 2021-09-19

## 2021-09-18 MED ORDER — GABAPENTIN 300 MG PO CAPS
300.0000 mg | ORAL_CAPSULE | Freq: Once | ORAL | Status: AC
Start: 2021-09-18 — End: 2021-09-18
  Administered 2021-09-18: 300 mg via ORAL
  Filled 2021-09-18: qty 1

## 2021-09-18 MED ORDER — ONDANSETRON HCL 4 MG PO TABS: 4.0000 mg | ORAL_TABLET | Freq: Four times a day (QID) | ORAL | Status: DC | PRN

## 2021-09-18 MED ORDER — OXYCODONE HCL ER 60 MG PO T12A: 1.0000 | EXTENDED_RELEASE_TABLET | Freq: Three times a day (TID) | ORAL | Status: DC

## 2021-09-18 MED ORDER — GABAPENTIN 300 MG PO CAPS
300.0000 mg | ORAL_CAPSULE | Freq: Three times a day (TID) | ORAL | Status: DC
  Administered 2021-09-19: 300 mg via ORAL
  Filled 2021-09-18: qty 1

## 2021-09-18 MED ORDER — ONDANSETRON HCL 4 MG/2ML IJ SOLN
4.0000 mg | Freq: Four times a day (QID) | INTRAMUSCULAR | Status: DC | PRN
  Administered 2021-09-19: 4 mg via INTRAVENOUS
  Filled 2021-09-18: qty 2

## 2021-09-18 MED ORDER — PANTOPRAZOLE SODIUM 40 MG IV SOLR
40.0000 mg | Freq: Two times a day (BID) | INTRAVENOUS | Status: DC
  Administered 2021-09-19 – 2021-09-23 (×9): 40 mg via INTRAVENOUS
  Filled 2021-09-18 (×9): qty 10

## 2021-09-18 MED ORDER — ONDANSETRON HCL 4 MG/2ML IJ SOLN
4.0000 mg | Freq: Once | INTRAMUSCULAR | Status: AC
  Administered 2021-09-18: 4 mg via INTRAVENOUS
  Filled 2021-09-18: qty 2

## 2021-09-18 MED ORDER — ALPRAZOLAM 1 MG PO TABS
1.0000 mg | ORAL_TABLET | Freq: Three times a day (TID) | ORAL | Status: DC | PRN
  Administered 2021-09-20 – 2021-09-23 (×9): 1 mg via ORAL
  Filled 2021-09-18 (×6): qty 1
  Filled 2021-09-18: qty 2
  Filled 2021-09-18 (×4): qty 1

## 2021-09-18 MED ORDER — MORPHINE SULFATE (PF) 4 MG/ML IV SOLN
4.0000 mg | Freq: Once | INTRAVENOUS | Status: AC
  Administered 2021-09-18: 4 mg via INTRAVENOUS
  Filled 2021-09-18: qty 1

## 2021-09-18 MED ORDER — OXYCODONE HCL 5 MG PO TABS: 60.0000 mg | ORAL_TABLET | Freq: Once | ORAL | Status: DC

## 2021-09-18 MED ORDER — ACETAMINOPHEN 325 MG PO TABS
650.0000 mg | ORAL_TABLET | Freq: Four times a day (QID) | ORAL | Status: DC | PRN
  Administered 2021-09-19 – 2021-09-22 (×8): 650 mg via ORAL
  Filled 2021-09-18 (×8): qty 2

## 2021-09-18 MED ORDER — ROPINIROLE HCL 1 MG PO TABS
1.0000 mg | ORAL_TABLET | Freq: Every day | ORAL | Status: DC
  Administered 2021-09-19 – 2021-09-22 (×5): 1 mg via ORAL
  Filled 2021-09-18 (×5): qty 1

## 2021-09-18 MED ORDER — PANTOPRAZOLE SODIUM 40 MG IV SOLR
40.0000 mg | Freq: Once | INTRAVENOUS | Status: AC
  Administered 2021-09-18: 40 mg via INTRAVENOUS
  Filled 2021-09-18: qty 10

## 2021-09-18 MED ORDER — OXYCODONE HCL ER 10 MG PO T12A
60.0000 mg | EXTENDED_RELEASE_TABLET | Freq: Two times a day (BID) | ORAL | Status: DC
  Administered 2021-09-18: 60 mg via ORAL
  Filled 2021-09-18: qty 6

## 2021-09-18 MED ORDER — MIRTAZAPINE 30 MG PO TABS
30.0000 mg | ORAL_TABLET | Freq: Every day | ORAL | Status: DC
Start: 2021-09-18 — End: 2021-09-23
  Administered 2021-09-19 – 2021-09-22 (×5): 30 mg via ORAL
  Filled 2021-09-18 (×5): qty 1

## 2021-09-18 MED ORDER — BUPROPION HCL ER (XL) 150 MG PO TB24
150.0000 mg | ORAL_TABLET | Freq: Every day | ORAL | Status: DC
  Administered 2021-09-19 – 2021-09-23 (×5): 150 mg via ORAL
  Filled 2021-09-18 (×5): qty 1

## 2021-09-18 MED ORDER — IOHEXOL 300 MG/ML  SOLN
100.0000 mL | Freq: Once | INTRAMUSCULAR | Status: AC | PRN
  Administered 2021-09-18: 80 mL via INTRAVENOUS

## 2021-09-18 NOTE — Assessment & Plan Note (Addendum)
Admission Hemoglobin 9.8 compared to prior baseline of 14 in June 2022.  GI bleeding with positive FOBT and elevated BUN 92. ?-Hgb down to 6.8 with IV fluids ?-She does not report further bowel movements since arrival to hospital ?-s/p 1 unit prbc with improvement of hemoglobin to 7.8 ?-Current hemoglobin 7.4, may have further downtrend with IV hydration ?-continue to follow hemoglobin, transfuse for hemoglobin <7 ?

## 2021-09-18 NOTE — ED Triage Notes (Signed)
Pt reports persistent nausea that began Friday. Denies vomiting. Generalized malaise. Pt also stating, "It feels like I swallowed a watermelon." ?

## 2021-09-18 NOTE — ED Provider Notes (Signed)
?[facility] DEPT ?Provider Note ? ? ?CSN: [id number] ?Arrival date & time: 09/18/21  1830 ? ?  ? ?History ? ?Chief Complaint  ?Patient presents with  ? Nausea  ? ? ?[name] is a 76 y.o. female with a PMH significant for previous colon cancer, CKD, COPD requiring 4L at baseline, chronic pain, previous breast cancer, chronic diastolic heart failure who presents with concern for epigastric abdominal pain, nausea. Patient reports worsening over the last week. She does endorse some dark stools over the last few days. She denies hx of blood thinners, previous GI surgery. She reports feeling like she needs to throw up but can't. Patient does endorse aspirin '81mg'$  use. ? ?HPI ? ?  ? ?Home Medications ?Prior to Admission medications   ?Medication Sig Start Date End Date Taking? Authorizing Provider  ?ALPRAZolam (XANAX) 1 MG tablet Take 1 tablet (1 mg total) by mouth 2 (two) times daily as needed. ?Patient taking differently: Take 1 mg by mouth 3 (three) times daily as needed for anxiety. 11/09/20  Yes Sheikh, Omair Latif, DO  ?amLODipine (NORVASC) 5 MG tablet Take 1 tablet (5 mg total) by mouth daily. 01/31/21  Yes Adrian Prows, MD  ?aspirin EC 81 MG tablet Take 81 mg by mouth daily. Swallow whole.   Yes [provider]  ?atorvastatin (LIPITOR) 10 MG tablet TAKE 1 TABLET BY MOUTH EVERY DAY ?Patient taking differently: Take 10 mg by mouth daily. 02/14/21  Yes Adrian Prows, MD  ?buPROPion (WELLBUTRIN XL) 150 MG 24 hr tablet Take 150 mg by mouth daily.   Yes [provider]  ?furosemide (LASIX) 20 MG tablet Take 1 tablet (20 mg total) by mouth daily. 02/24/21  Yes Adrian Prows, MD  ?gabapentin (NEURONTIN) 300 MG capsule Take 300 mg by mouth 3 (three) times daily. 04/01/19  Yes [provider]  ?losartan-hydrochlorothiazide (HYZAAR) 100-12.5 MG tablet TAKE 1 TABLET BY MOUTH EVERY DAY 05/11/21  Yes Adrian Prows, MD  ?mirtazapine (REMERON) 30 MG tablet Take 30 mg by mouth at  bedtime. 04/22/16  Yes [provider]  ?Multiple Vitamins-Minerals (MULTI FOR HER 50+) TABS Take 1 tablet by mouth daily.   Yes [provider]  ?oxyCODONE (OXYCONTIN) 60 MG 12 hr tablet Take 1 tablet by mouth every 8 (eight) hours.   Yes [provider]  ?potassium chloride (KLOR-CON) 10 MEQ tablet TAKE 1 TABLET (10 MEQ TOTAL) BY MOUTH DAILY. TAKE WITH LASIX 05/30/21  Yes Adrian Prows, MD  ?rOPINIRole (REQUIP) 1 MG tablet Take 1 mg by mouth at bedtime.   Yes [provider]  ?acetaminophen (TYLENOL) 325 MG tablet Take 2 tablets (650 mg total) by mouth every 6 (six) hours as needed for mild pain (or Fever >/= 101). ?Patient not taking: Reported on 09/18/2021 11/09/20   Raiford Noble Latif, DO  ?guaiFENesin (MUCINEX) 600 MG 12 hr tablet Take 600 mg by mouth 2 (two) times daily. ?Patient not taking: Reported on 06/21/2021    [provider]  ?   ? ?Allergies    ?Patient has no known allergies.   ? ?Review of Systems   ?Review of Systems  ?Gastrointestinal:  Positive for abdominal distention, abdominal pain and nausea.  ?All other systems reviewed and are negative. ? ?Physical Exam ?Updated Vital Signs ?BP 110/61   Pulse 70   Temp 97.7 ?F (36.5 ?C) (Oral)   Resp 16   Ht 5' (1.524 m)   Wt 56.6 kg   SpO2 100%   BMI 24.35  kg/m?  ?Physical Exam ?Vitals and nursing note reviewed.  ?Constitutional:   ?   General: She is not in acute distress. ?   Appearance: Normal appearance. She is ill-appearing.  ?HENT:  ?   Head: Normocephalic and atraumatic.  ?Eyes:  ?   General:     ?   Right eye: No discharge.     ?   Left eye: No discharge.  ?Cardiovascular:  ?   Rate and Rhythm: Normal rate and regular rhythm.  ?   Heart sounds: No murmur heard. ?  No friction rub. No gallop.  ?Pulmonary:  ?   Effort: Pulmonary effort is normal.  ?   Breath sounds: Normal breath sounds.  ?   Comments: Poor respiratory effort but no significant accessory breath sounds ?Abdominal:  ?   General: Bowel sounds  are normal.  ?   Palpations: Abdomen is soft.  ?   Comments: Tenderness to palpation in the epigastric region, some abdominal distension noted, without obvious palpable mass. No rebound, rigidity, guarding.  ?Skin: ?   General: Skin is warm and dry.  ?   Capillary Refill: Capillary refill takes less than 2 seconds.  ?   Coloration: Skin is pale.  ?Neurological:  ?   Mental Status: She is alert and oriented to person, place, and time.  ?   Comments: Patient without confusion, disorientation at this time  ?Psychiatric:     ?   Mood and Affect: Mood normal.     ?   Behavior: Behavior normal.  ? ? ?ED Results / Procedures / Treatments   ?Labs ?(all labs ordered are listed, but only abnormal results are displayed) ?Labs Reviewed  ?COMPREHENSIVE METABOLIC PANEL - Abnormal; Notable for the following components:  ?    Result Value  ? Glucose, Bld 123 (*)   ? BUN 92 (*)   ? Creatinine, Ser 1.30 (*)   ? Calcium 10.4 (*)   ? Total Protein 6.3 (*)   ? GFR, Estimated 43 (*)   ? All other components within normal limits  ?CBC - Abnormal; Notable for the following components:  ? RBC 3.10 (*)   ? Hemoglobin 9.8 (*)   ? HCT 30.0 (*)   ? All other components within normal limits  ?URINALYSIS, ROUTINE W REFLEX MICROSCOPIC - Abnormal; Notable for the following components:  ? Color, Urine STRAW (*)   ? All other components within normal limits  ?VITAMIN B12 - Abnormal; Notable for the following components:  ? Vitamin B-12 163 (*)   ? All other components within normal limits  ?IRON AND TIBC - Abnormal; Notable for the following components:  ? Iron 229 (*)   ? Saturation Ratios 82 (*)   ? All other components within normal limits  ?RETICULOCYTES - Abnormal; Notable for the following components:  ? RBC. 3.08 (*)   ? All other components within normal limits  ?COMPREHENSIVE METABOLIC PANEL - Abnormal; Notable for the following components:  ? BUN 94 (*)   ? Creatinine, Ser 1.26 (*)   ? Total Protein 4.9 (*)   ? Albumin 2.8 (*)   ? GFR,  Estimated 45 (*)   ? Anion gap 3 (*)   ? All other components within normal limits  ?CBC - Abnormal; Notable for the following components:  ? WBC 3.9 (*)   ? RBC 2.16 (*)   ? Hemoglobin 6.8 (*)   ? HCT 21.1 (*)   ? All other components within normal limits  ?  POC OCCULT BLOOD, ED - Abnormal; Notable for the following components:  ? Fecal Occult Bld POSITIVE (*)   ? All other components within normal limits  ?LIPASE, BLOOD  ?FOLATE  ?FERRITIN  ?PROTIME-INR  ?TYPE AND SCREEN  ?PREPARE RBC (CROSSMATCH)  ? ? ?EKG ?EKG Interpretation ? ?Date/Time:  Sunday September 18 2021 20:32:03 EDT ?Ventricular Rate:  108 ?PR Interval:  161 ?QRS Duration: 79 ?QT Interval:  398 ?QTC Calculation: 476 ?R Axis:   75 ?Text Interpretation: Sinus tachycardia Paired ventricular premature complexes Consider right atrial enlargement Abnormal T, consider ischemia, diffuse leads No acute changes No significant change since last tracing Confirmed by Varney Biles [phone]) on 09/18/2021 8:49:11 PM ? ?Radiology ?CT ABDOMEN PELVIS W CONTRAST ? ?Result Date: 09/18/2021 ?CLINICAL DATA:  Acute abdominal pain. EXAM: CT ABDOMEN AND PELVIS WITH CONTRAST TECHNIQUE: Multidetector CT imaging of the abdomen and pelvis was performed using the standard protocol following bolus administration of intravenous contrast. RADIATION DOSE REDUCTION: This exam was performed according to the departmental dose-optimization program which includes automated exposure control, adjustment of the mA and/or kV according to patient size and/or use of iterative reconstruction technique. CONTRAST:  34m OMNIPAQUE IOHEXOL 300 MG/ML  SOLN COMPARISON:  CT chest abdomen and pelvis 04/14/2015. FINDINGS: Lower chest: No acute abnormality. Hepatobiliary: Hypodense area in the dome of the liver is too small to characterize new from prior, possibly a cyst or hemangioma. Gallbladder and bile ducts are within normal limits. Pancreas: Unremarkable. No pancreatic ductal dilatation or surrounding  inflammatory changes. Spleen: Normal in size without focal abnormality. Adrenals/Urinary Tract: Limited evaluation secondary to streak artifact from the spine. No hydronephrosis or perinephric fluid. Rounded hypodensi

## 2021-09-18 NOTE — H&P (Signed)
+ ?History and Physical  ? ? ?Holly S Mees BOF:[id number] DOB: 1945-09-17 DOA: 09/18/2021 ? ?PCP: Scifres, Dorothy, PA-C  ?Patient coming from: Home ? ?I have personally briefly reviewed patient's old medical records in [place] ? ?Chief Complaint: Nausea, abdominal pain, dark stools ? ?HPI: ?[name] is a 76 y.o. female with medical history significant for COPD, chronic respiratory failure with hypoxia on 3-4 L O2 via [place], CKD stage IIIb, PAD, HTN, colon cancer s/p right hemicolectomy, iron deficiency anemia, chronic back pain, depression/anxiety who presented to the ED for evaluation of nausea, abdominal pain, dark stools. ? ?Patient reports 3 days of epigastric abdominal pain, nausea without emesis, and dark stools.  She reports 1 episode of seeing a small amount of red blood mixed in her stool a few days ago but no bright red blood since then. ? ?She states she does take aspirin 81 mg daily.  She has taken quite extra strength headache relief recently as well for headache. ? ?She denies any worsening dyspnea from baseline, chest pain, dysuria, diarrhea. ? ?ED Course  Labs/Imaging on admission: I have personally reviewed following labs and imaging studies. ? ?Initial vitals showed BP 148/94, pulse 59, RR 20, temp 98.4 ?F, SPO2 97% on 4 L O2 via [place]. ? ?Labs show WBC 5.5, hemoglobin 9.8 (previously 14.0 11/09/2020), platelets 239,000, sodium 140, potassium 4.0, bicarb 28, BUN 92, creatinine 1.30, serum glucose 123, LFTs within normal limits, lipase 23. ? ?CT abdomen/pelvis with contrast negative for acute localizing process in the abdomen or pelvis.  Severe atherosclerotic calcification seen.  Hypodensity in the dome of the liver noted and too small to characterize, possibly a cyst or hemangioma per radiology read.  ? ?FOBT is positive.  Patient was given IV Protonix 40 mg, IV morphine, 1 L normal saline.  The hospitalist service was consulted to admit for further evaluation and  management. ? ?Review of Systems: All systems reviewed and are negative except as documented in history of present illness above. ? ? ?Past Medical History:  ?Diagnosis Date  ? Acute renal failure superimposed on stage 3 chronic kidney disease ([place]) 10/27/2014  ? Back pain, chronic   ? Breast cancer ([place]) 09/09/10  ? Cancer of colon ([facility]) 05/24/2011  ? Centrilobular emphysema ([place]) 07/15/2018  ? CKD (chronic kidney disease), stage III ([place])   ? Claudication, intermittent ([facility]) 07/15/2018  ? Depression   ? GIB (gastrointestinal bleeding)   ? H/O tobacco use, presenting hazards to health Quit Dec 2019 05/03/2011  ? 50 years, up to 3PPD quit last year.   ? Hx of varicose veins of lower extremity 07/15/2018  ? Hypertension   ? SDH (subdural hematoma) (H[facility])   ? SOB (shortness of breath) 07/15/2018  ? ? ?Past Surgical History:  ?Procedure Laterality Date  ? ABDOMINAL AORTOGRAM W/LOWER EXTREMITY Left 03/18/2019  ? Procedure: ABDOMINAL AORTOGRAM W/LOWER EXTREMITY;  Surgeon: Adrian Prows, MD;  Location: [facility];  Service: Cardiovascular;  Laterality: Left;  ? BACK SURGERY    ? BREAST LUMPECTOMY Right 10/10/2010  ? COLON SURGERY  04/2011  ? COLONOSCOPY  05/03/2011  ? Procedure: COLONOSCOPY;  Surgeon: Jeryl Columbia, MD;  Location: [facility];  Service: Endoscopy;  Laterality: N/A;  ? JOINT REPLACEMENT    ? R&L total shoulder replacements  ? LUMBAR DISC SURGERY    ? PARTIAL HYSTERECTOMY    ? PERIPHERAL VASCULAR INTERVENTION  03/18/2019  ? Procedure: PERIPHERAL VASCULAR INTERVENTION;  Surgeon: Adrian Prows, MD;  Location:  [facility];  Service: Cardiovascular;;  attempted left SFA  ? SHOULDER SURGERY    ? TUMOR REMOVAL  Tumor removed R breast  ? ? ?Social History: ? reports that she quit smoking about 22 months ago. Her smoking use included cigarettes. She has a 7.50 pack-year smoking history. She has never used smokeless tobacco. She reports current alcohol use. She reports that she does not use drugs. ? ?No Known  Allergies ? ?Family History  ?Problem Relation Age of Onset  ? Diabetes Mother   ? Hypertension Mother   ? Cancer Mother   ?     leukemia  ? Sudden death Mother   ? Heart attack Mother   ? Anesthesia problems Neg Hx   ? Hypotension Neg Hx   ? Malignant hyperthermia Neg Hx   ? Pseudochol deficiency Neg Hx   ? Breast cancer Neg Hx   ? Lung disease Neg Hx   ? ? ? ?Prior to Admission medications   ?Medication Sig Start Date End Date Taking? Authorizing Provider  ?acetaminophen (TYLENOL) 325 MG tablet Take 2 tablets (650 mg total) by mouth every 6 (six) hours as needed for mild pain (or Fever >/= 101). ?Patient not taking: Reported on 06/21/2021 11/09/20   Kerney Elbe, DO  ?ALPRAZolam (XANAX) 1 MG tablet Take 1 tablet (1 mg total) by mouth 2 (two) times daily as needed. 11/09/20   Raiford Noble Latif, DO  ?amLODipine (NORVASC) 5 MG tablet Take 1 tablet (5 mg total) by mouth daily. 01/31/21   Adrian Prows, MD  ?aspirin EC 81 MG tablet Take 81 mg by mouth daily. Swallow whole.    [provider]  ?atorvastatin (LIPITOR) 10 MG tablet TAKE 1 TABLET BY MOUTH EVERY DAY 02/14/21   Adrian Prows, MD  ?buPROPion (WELLBUTRIN XL) 150 MG 24 hr tablet Take 150 mg by mouth daily.    [provider]  ?furosemide (LASIX) 20 MG tablet Take 1 tablet (20 mg total) by mouth daily. 02/24/21   Adrian Prows, MD  ?gabapentin (NEURONTIN) 100 MG capsule Take 300 mg by mouth 3 (three) times daily. 04/01/19   [provider]  ?guaiFENesin (MUCINEX) 600 MG 12 hr tablet Take 600 mg by mouth 2 (two) times daily. ?Patient not taking: Reported on 06/21/2021    [provider]  ?losartan-hydrochlorothiazide (HYZAAR) 100-12.5 MG tablet TAKE 1 TABLET BY MOUTH EVERY DAY ?Patient not taking: Reported on 06/21/2021 05/11/21   Adrian Prows, MD  ?mirtazapine (REMERON) 30 MG tablet 30 mg daily.  04/22/16   [provider]  ?Multiple Vitamin (MULTIVITAMIN WITH MINERALS) TABS tablet Take 1 tablet by mouth daily.    [provider]  ?Multiple Vitamins-Minerals (MULTI FOR HER 50+) TABS Take 1 tablet by mouth daily.    [provider]  ?oxyCODONE (OXYCONTIN) 60 MG 12 hr tablet Take 1 tablet by mouth every 8 (eight) hours.    [provider]  ?potassium chloride (KLOR-CON) 10 MEQ tablet TAKE 1 TABLET (10 MEQ TOTAL) BY MOUTH DAILY. TAKE WITH LASIX 05/30/21   Adrian Prows, MD  ?rOPINIRole (REQUIP) 1 MG tablet Take 1 mg by mouth at bedtime.    [provider]  ? ? ?Physical Exam: ?Vitals:  ? 09/18/21 2030 09/18/21 2115 09/18/21 2145 09/18/21 2200  ?BP: (!) 154/82 (!) 132/95 (!) 157/102 102/78  ?Pulse: 93 87 95 (!) 106  ?Resp: (!) '22 16 14 18  '$ ?Temp:      ?TempSrc:      ?  SpO2: 100% 100% 100% 100%  ? ?Constitutional: Chronically ill-appearing elderly woman resting in bed with head elevated.  NAD, calm, comfortable ?Eyes: EOMI, lids and conjunctivae normal ?ENMT: Mucous membranes are moist. Posterior pharynx clear of any exudate or lesions.Normal dentition.  ?Neck: normal, supple, no masses. ?Respiratory: Distant breath sounds.  Clear to auscultation bilaterally, no wheezing, no crackles. Normal respiratory effort while on 4 L O2 via [place]. No accessory muscle use.  ?Cardiovascular: Regular rate and rhythm, no murmurs / rubs / gallops. No extremity edema. 2+ pedal pulses. ?Abdomen: Mild epigastric tenderness, no masses palpated. No hepatosplenomegaly. Bowel sounds positive.  ?Musculoskeletal: no clubbing / cyanosis. Good ROM, no contractures. Normal muscle tone.  ?Skin: no rashes, lesions, ulcers. No induration ?Neurologic: Sensation intact. Strength equal bilaterally. ?Psychiatric: Alert and oriented x 3. ? ?EKG: Personally reviewed. Sinus rhythm with frequent PVCs.  Frequent PVCs new when compared to prior. ? ?Assessment/Plan ?Principal Problem: ?  Anemia ?Active Problems: ?  COPD (chronic obstructive pulmonary disease) ([place]) ?  Chronic respiratory failure with hypoxia (H[facility]) ?  Chronic kidney disease, stage 3b  ([place]) ?  HTN (hypertension) ?  PAD (peripheral artery disease) ([place]) ?  Anxiety and depression ?  Chronic back pain ?  ?[name] is a 76 y.o. female with medical history significant for COPD, chronic respiratory failure with h

## 2021-09-18 NOTE — ED Notes (Signed)
Pt refused hemoccult. ?

## 2021-09-19 ENCOUNTER — Ambulatory Visit: Payer: Medicare Other | Admitting: Internal Medicine

## 2021-09-19 DIAGNOSIS — J432 Centrilobular emphysema: Secondary | ICD-10-CM | POA: Diagnosis present

## 2021-09-19 DIAGNOSIS — R400 Somnolence: Secondary | ICD-10-CM

## 2021-09-19 DIAGNOSIS — I509 Heart failure, unspecified: Secondary | ICD-10-CM | POA: Diagnosis not present

## 2021-09-19 DIAGNOSIS — D509 Iron deficiency anemia, unspecified: Secondary | ICD-10-CM | POA: Diagnosis present

## 2021-09-19 DIAGNOSIS — G894 Chronic pain syndrome: Secondary | ICD-10-CM

## 2021-09-19 DIAGNOSIS — D62 Acute posthemorrhagic anemia: Secondary | ICD-10-CM | POA: Diagnosis not present

## 2021-09-19 DIAGNOSIS — R110 Nausea: Secondary | ICD-10-CM | POA: Diagnosis present

## 2021-09-19 DIAGNOSIS — Z79899 Other long term (current) drug therapy: Secondary | ICD-10-CM | POA: Diagnosis not present

## 2021-09-19 DIAGNOSIS — J9611 Chronic respiratory failure with hypoxia: Secondary | ICD-10-CM

## 2021-09-19 DIAGNOSIS — I739 Peripheral vascular disease, unspecified: Secondary | ICD-10-CM | POA: Diagnosis present

## 2021-09-19 DIAGNOSIS — Z85038 Personal history of other malignant neoplasm of large intestine: Secondary | ICD-10-CM | POA: Diagnosis not present

## 2021-09-19 DIAGNOSIS — I130 Hypertensive heart and chronic kidney disease with heart failure and stage 1 through stage 4 chronic kidney disease, or unspecified chronic kidney disease: Secondary | ICD-10-CM | POA: Diagnosis not present

## 2021-09-19 DIAGNOSIS — M549 Dorsalgia, unspecified: Secondary | ICD-10-CM | POA: Diagnosis present

## 2021-09-19 DIAGNOSIS — K2971 Gastritis, unspecified, with bleeding: Secondary | ICD-10-CM | POA: Diagnosis not present

## 2021-09-19 DIAGNOSIS — N1832 Chronic kidney disease, stage 3b: Secondary | ICD-10-CM

## 2021-09-19 DIAGNOSIS — E538 Deficiency of other specified B group vitamins: Secondary | ICD-10-CM | POA: Diagnosis present

## 2021-09-19 DIAGNOSIS — Z833 Family history of diabetes mellitus: Secondary | ICD-10-CM | POA: Diagnosis not present

## 2021-09-19 DIAGNOSIS — K319 Disease of stomach and duodenum, unspecified: Secondary | ICD-10-CM | POA: Diagnosis not present

## 2021-09-19 DIAGNOSIS — Z9049 Acquired absence of other specified parts of digestive tract: Secondary | ICD-10-CM | POA: Diagnosis not present

## 2021-09-19 DIAGNOSIS — D519 Vitamin B12 deficiency anemia, unspecified: Secondary | ICD-10-CM | POA: Diagnosis not present

## 2021-09-19 DIAGNOSIS — Z8249 Family history of ischemic heart disease and other diseases of the circulatory system: Secondary | ICD-10-CM | POA: Diagnosis not present

## 2021-09-19 DIAGNOSIS — D631 Anemia in chronic kidney disease: Secondary | ICD-10-CM | POA: Diagnosis not present

## 2021-09-19 DIAGNOSIS — K254 Chronic or unspecified gastric ulcer with hemorrhage: Secondary | ICD-10-CM | POA: Diagnosis not present

## 2021-09-19 DIAGNOSIS — K449 Diaphragmatic hernia without obstruction or gangrene: Secondary | ICD-10-CM | POA: Diagnosis not present

## 2021-09-19 DIAGNOSIS — K921 Melena: Secondary | ICD-10-CM | POA: Diagnosis not present

## 2021-09-19 DIAGNOSIS — Z66 Do not resuscitate: Secondary | ICD-10-CM | POA: Diagnosis present

## 2021-09-19 DIAGNOSIS — I5032 Chronic diastolic (congestive) heart failure: Secondary | ICD-10-CM | POA: Diagnosis present

## 2021-09-19 DIAGNOSIS — J449 Chronic obstructive pulmonary disease, unspecified: Secondary | ICD-10-CM

## 2021-09-19 DIAGNOSIS — N183 Chronic kidney disease, stage 3 unspecified: Secondary | ICD-10-CM | POA: Diagnosis not present

## 2021-09-19 DIAGNOSIS — Z87891 Personal history of nicotine dependence: Secondary | ICD-10-CM | POA: Diagnosis not present

## 2021-09-19 DIAGNOSIS — R1310 Dysphagia, unspecified: Secondary | ICD-10-CM | POA: Diagnosis not present

## 2021-09-19 DIAGNOSIS — G2581 Restless legs syndrome: Secondary | ICD-10-CM | POA: Diagnosis present

## 2021-09-19 DIAGNOSIS — Z853 Personal history of malignant neoplasm of breast: Secondary | ICD-10-CM | POA: Diagnosis not present

## 2021-09-19 DIAGNOSIS — F32A Depression, unspecified: Secondary | ICD-10-CM | POA: Diagnosis not present

## 2021-09-19 DIAGNOSIS — K922 Gastrointestinal hemorrhage, unspecified: Secondary | ICD-10-CM | POA: Diagnosis not present

## 2021-09-19 DIAGNOSIS — Z7982 Long term (current) use of aspirin: Secondary | ICD-10-CM | POA: Diagnosis not present

## 2021-09-19 DIAGNOSIS — G8929 Other chronic pain: Secondary | ICD-10-CM | POA: Diagnosis present

## 2021-09-19 DIAGNOSIS — K259 Gastric ulcer, unspecified as acute or chronic, without hemorrhage or perforation: Secondary | ICD-10-CM | POA: Diagnosis not present

## 2021-09-19 DIAGNOSIS — K2900 Acute gastritis without bleeding: Secondary | ICD-10-CM | POA: Diagnosis not present

## 2021-09-19 DIAGNOSIS — Z9981 Dependence on supplemental oxygen: Secondary | ICD-10-CM | POA: Diagnosis not present

## 2021-09-19 DIAGNOSIS — F419 Anxiety disorder, unspecified: Secondary | ICD-10-CM | POA: Diagnosis present

## 2021-09-19 DIAGNOSIS — K2901 Acute gastritis with bleeding: Secondary | ICD-10-CM | POA: Diagnosis not present

## 2021-09-19 LAB — COMPREHENSIVE METABOLIC PANEL
ALT: 15 U/L (ref 0–44)
AST: 23 U/L (ref 15–41)
Albumin: 2.8 g/dL — ABNORMAL LOW (ref 3.5–5.0)
Alkaline Phosphatase: 52 U/L (ref 38–126)
Anion gap: 3 — ABNORMAL LOW (ref 5–15)
BUN: 94 mg/dL — ABNORMAL HIGH (ref 8–23)
CO2: 31 mmol/L (ref 22–32)
Calcium: 10 mg/dL (ref 8.9–10.3)
Chloride: 107 mmol/L (ref 98–111)
Creatinine, Ser: 1.26 mg/dL — ABNORMAL HIGH (ref 0.44–1.00)
GFR, Estimated: 45 mL/min — ABNORMAL LOW (ref >60)
Glucose, Bld: 99 mg/dL (ref 70–99)
Potassium: 3.9 mmol/L (ref 3.5–5.1)
Sodium: 141 mmol/L (ref 135–145)
Total Bilirubin: 0.4 mg/dL (ref 0.3–1.2)
Total Protein: 4.9 g/dL — ABNORMAL LOW (ref 6.5–8.1)

## 2021-09-19 LAB — CBC
HCT: 21.1 % — ABNORMAL LOW (ref 36.0–46.0)
HCT: 23.6 % — ABNORMAL LOW (ref 36.0–46.0)
Hemoglobin: 6.8 g/dL — CL (ref 12.0–15.0)
Hemoglobin: 7.8 g/dL — ABNORMAL LOW (ref 12.0–15.0)
MCH: 31.5 pg (ref 26.0–34.0)
MCH: 31.3 pg (ref 26.0–34.0)
MCHC: 32.2 g/dL (ref 30.0–36.0)
MCHC: 33.1 g/dL (ref 30.0–36.0)
MCV: 97.7 fL (ref 80.0–100.0)
MCV: 94.8 fL (ref 80.0–100.0)
Platelets: 165 10*3/uL (ref 150–400)
Platelets: 152 10*3/uL (ref 150–400)
RBC: 2.16 MIL/uL — ABNORMAL LOW (ref 3.87–5.11)
RBC: 2.49 MIL/uL — ABNORMAL LOW (ref 3.87–5.11)
RDW: 12.7 % (ref 11.5–15.5)
RDW: 14.6 % (ref 11.5–15.5)
WBC: 3.9 10*3/uL — ABNORMAL LOW (ref 4.0–10.5)
WBC: 4.4 10*3/uL (ref 4.0–10.5)
nRBC: 0 % (ref 0.0–0.2)
nRBC: 0 % (ref 0.0–0.2)

## 2021-09-19 LAB — IRON AND TIBC
Iron: 229 ug/dL — ABNORMAL HIGH (ref 28–170)
Saturation Ratios: 82 % — ABNORMAL HIGH (ref 10.4–31.8)
TIBC: 278 ug/dL (ref 250–450)
UIBC: 49 ug/dL

## 2021-09-19 LAB — FOLATE: Folate: 13.4 ng/mL (ref >5.9)

## 2021-09-19 LAB — VITAMIN B12: Vitamin B-12: 163 pg/mL — ABNORMAL LOW (ref 180–914)

## 2021-09-19 LAB — GLUCOSE, CAPILLARY: Glucose-Capillary: 111 mg/dL — ABNORMAL HIGH (ref 70–99)

## 2021-09-19 LAB — FERRITIN: Ferritin: 71 ng/mL (ref 11–307)

## 2021-09-19 LAB — PREPARE RBC (CROSSMATCH)

## 2021-09-19 LAB — PROTIME-INR
INR: 1.2 (ref 0.8–1.2)
Prothrombin Time: 14.8 seconds (ref 11.4–15.2)

## 2021-09-19 MED ORDER — ALBUTEROL SULFATE (2.5 MG/3ML) 0.083% IN NEBU: 2.5000 mg | INHALATION_SOLUTION | RESPIRATORY_TRACT | Status: DC | PRN

## 2021-09-19 MED ORDER — OXYCODONE HCL ER 40 MG PO T12A: 60.0000 mg | EXTENDED_RELEASE_TABLET | Freq: Three times a day (TID) | ORAL | Status: DC

## 2021-09-19 MED ORDER — SODIUM CHLORIDE 0.9% IV SOLUTION
Freq: Once | INTRAVENOUS | Status: AC
Start: 2021-09-19 — End: 2021-09-19

## 2021-09-19 MED ORDER — OXYCODONE HCL ER 40 MG PO T12A
60.0000 mg | EXTENDED_RELEASE_TABLET | Freq: Three times a day (TID) | ORAL | Status: DC
  Administered 2021-09-19 – 2021-09-23 (×11): 60 mg via ORAL
  Filled 2021-09-19 (×14): qty 1

## 2021-09-19 MED ORDER — CYANOCOBALAMIN 1000 MCG/ML IJ SOLN
1000.0000 ug | Freq: Once | INTRAMUSCULAR | Status: AC
  Administered 2021-09-19: 1000 ug via INTRAMUSCULAR
  Filled 2021-09-19: qty 1

## 2021-09-19 MED ORDER — ATORVASTATIN CALCIUM 10 MG PO TABS
10.0000 mg | ORAL_TABLET | Freq: Every day | ORAL | Status: DC
  Administered 2021-09-19 – 2021-09-23 (×5): 10 mg via ORAL
  Filled 2021-09-19 (×5): qty 1

## 2021-09-19 MED ORDER — IPRATROPIUM-ALBUTEROL 0.5-2.5 (3) MG/3ML IN SOLN
3.0000 mL | Freq: Two times a day (BID) | RESPIRATORY_TRACT | Status: DC
  Administered 2021-09-19 – 2021-09-22 (×4): 3 mL via RESPIRATORY_TRACT
  Filled 2021-09-19 (×7): qty 3

## 2021-09-19 MED ORDER — LACTATED RINGERS IV SOLN: INTRAVENOUS | Status: AC

## 2021-09-19 MED ORDER — OXYCODONE HCL ER 15 MG PO T12A: 30.0000 mg | EXTENDED_RELEASE_TABLET | Freq: Two times a day (BID) | ORAL | Status: DC

## 2021-09-19 MED ORDER — PROCHLORPERAZINE EDISYLATE 10 MG/2ML IJ SOLN: 10.0000 mg | Freq: Four times a day (QID) | INTRAMUSCULAR | Status: DC | PRN

## 2021-09-19 MED ORDER — GABAPENTIN 300 MG PO CAPS
300.0000 mg | ORAL_CAPSULE | Freq: Three times a day (TID) | ORAL | Status: DC
  Administered 2021-09-19 – 2021-09-23 (×12): 300 mg via ORAL
  Filled 2021-09-19 (×12): qty 1

## 2021-09-19 MED ORDER — IPRATROPIUM-ALBUTEROL 0.5-2.5 (3) MG/3ML IN SOLN
3.0000 mL | Freq: Three times a day (TID) | RESPIRATORY_TRACT | Status: DC
  Administered 2021-09-19: 3 mL via RESPIRATORY_TRACT
  Filled 2021-09-19: qty 3

## 2021-09-19 NOTE — Hospital Course (Signed)
[name] is a 76 y.o. female with medical history significant for COPD, chronic respiratory failure with hypoxia on 3-4 L O2 via [place], CKD stage IIIb, PAD, HTN, colon cancer s/p right hemicolectomy, iron deficiency anemia, chronic back pain, depression/anxiety who is admitted with anemia suspected due to upper GI bleeding. ?

## 2021-09-19 NOTE — Assessment & Plan Note (Addendum)
Chronically on 4 L O2 via [place]. ?

## 2021-09-19 NOTE — Assessment & Plan Note (Addendum)
Stable, continue to monitor. ?Elevated BUN likely related to upper GI bleeding, improving ?

## 2021-09-19 NOTE — Assessment & Plan Note (Addendum)
Reported dark stools over the past week prior to admission.  ?FOBT + ?BUN elevated indicating upper GI bleeding ?GI consulted ?She is on protonix ?S/p EGD which showed acute gastritis and non bleeding gastric ulcer ?Restarted on clear liquids, advance as tolerated ?

## 2021-09-19 NOTE — Assessment & Plan Note (Addendum)
Noted on anemia panel ?B12 163 ?Folate is not deficient ?Received IM B12 5/1 ?Will start oral replacement ?

## 2021-09-19 NOTE — H&P (View-Only) (Signed)
Referring Provider: TRH ?Primary Care Physician:  Maude Leriche, PA-C ?Primary Gastroenterologist: [facility]/ Dr. Paulita Fujita ? ?Reason for Consultation:, Abdominal pain, dark stools nausea ? ?HPI: [name] is a 76 y.o. female with medical history significant for COPD, chronic respiratory failure with hypoxia on 3-4 L O2 via , CKD stage IIIb, PAD, HTN, colon cancer s/p right hemicolectomy, iron deficiency anemia, chronic back pain, depression/anxiety who presented to the ED for evaluation of nausea, abdominal pain, dark stools. ? ?Note she began to have dark brown loose stools today.  She also began to have nausea and epigastric pain at that time.  Notes pain was worse with eating.  She denies previous episodes of melena.  Denies hematemesis or hematochezia.  ? ?Notes she has been increasingly fatigued for the last couple of weeks, she has also had difficulty sleeping at night. ? ?Denies blood thinner use, denies NSAID use other than daily baby aspirin, denies frequent alcohol use. ?Quit smoking 1 year ago. ? ?EGD 06/16/2015 ?Normal esophagus ?Erosive gastropathy biopsy negative for H. pylori ?Normal duodenum ?Otherwise normal exam ? ?Colonoscopy 06/16/2015 ?Ileocolonic anastomosis  ?otherwise normal exam ?Recall 5 years for surveillance ? ?Patient with right hemicolectomy for colon cancer in 2012.  Follow-up colonoscopy 2014 showed couple small adenomatous polyps which were removed. ? ?Last seen with Dr. Paulita Fujita 06/10/2015. ? ?Past Medical History:  ?Diagnosis Date  ? Acute renal failure superimposed on stage 3 chronic kidney disease ([place]) 10/27/2014  ? Back pain, chronic   ? Breast cancer ([place]) 09/09/10  ? Cancer of colon ([place]) 05/24/2011  ? Centrilobular emphysema ([place]) 07/15/2018  ? CKD (chronic kidney disease), stage III ([place])   ? Claudication, intermittent ([place]) 07/15/2018  ? Depression   ? GIB (gastrointestinal bleeding)   ? H/O tobacco use, presenting hazards to health Quit Dec 2019 05/03/2011  ? 50 years, up  to 3PPD quit last year.   ? Hx of varicose veins of lower extremity 07/15/2018  ? Hypertension   ? SDH (subdural hematoma) (HCC)   ? SOB (shortness of breath) 07/15/2018  ? ? ?Past Surgical History:  ?Procedure Laterality Date  ? ABDOMINAL AORTOGRAM W/LOWER EXTREMITY Left 03/18/2019  ? Procedure: ABDOMINAL AORTOGRAM W/LOWER EXTREMITY;  Surgeon: Adrian Prows, MD;  Location: [facility];  Service: Cardiovascular;  Laterality: Left;  ? BACK SURGERY    ? BREAST LUMPECTOMY Right 10/10/2010  ? COLON SURGERY  04/2011  ? COLONOSCOPY  05/03/2011  ? Procedure: COLONOSCOPY;  Surgeon: Jeryl Columbia, MD;  Location: [facility];  Service: Endoscopy;  Laterality: N/A;  ? JOINT REPLACEMENT    ? R&L total shoulder replacements  ? LUMBAR DISC SURGERY    ? PARTIAL HYSTERECTOMY    ? PERIPHERAL VASCULAR INTERVENTION  03/18/2019  ? Procedure: PERIPHERAL VASCULAR INTERVENTION;  Surgeon: Adrian Prows, MD;  Location: [place] [facility];  Service: Cardiovascular;;  attempted left SFA  ? SHOULDER SURGERY    ? TUMOR REMOVAL  Tumor removed R breast  ? ? ?Prior to Admission medications   ?Medication Sig Start Date End Date Taking? Authorizing Provider  ?ALPRAZolam (XANAX) 1 MG tablet Take 1 tablet (1 mg total) by mouth 2 (two) times daily as needed. ?Patient taking differently: Take 1 mg by mouth 3 (three) times daily as needed for anxiety. 11/09/20  Yes Sheikh, Omair Latif, DO  ?amLODipine (NORVASC) 5 MG tablet Take 1 tablet (5 mg total) by mouth daily. 01/31/21  Yes Adrian Prows, MD  ?aspirin EC 81 MG tablet Take 81  mg by mouth daily. Swallow whole.   Yes [provider]  ?atorvastatin (LIPITOR) 10 MG tablet TAKE 1 TABLET BY MOUTH EVERY DAY ?Patient taking differently: Take 10 mg by mouth daily. 02/14/21  Yes Adrian Prows, MD  ?buPROPion (WELLBUTRIN XL) 150 MG 24 hr tablet Take 150 mg by mouth daily.   Yes [provider]  ?furosemide (LASIX) 20 MG tablet Take 1 tablet (20 mg total) by mouth daily. 02/24/21  Yes Adrian Prows, MD   ?gabapentin (NEURONTIN) 300 MG capsule Take 300 mg by mouth 3 (three) times daily. 04/01/19  Yes [provider]  ?losartan-hydrochlorothiazide (HYZAAR) 100-12.5 MG tablet TAKE 1 TABLET BY MOUTH EVERY DAY 05/11/21  Yes Adrian Prows, MD  ?mirtazapine (REMERON) 30 MG tablet Take 30 mg by mouth at bedtime. 04/22/16  Yes [provider]  ?Multiple Vitamins-Minerals (MULTI FOR HER 50+) TABS Take 1 tablet by mouth daily.   Yes [provider]  ?oxyCODONE (OXYCONTIN) 60 MG 12 hr tablet Take 1 tablet by mouth every 8 (eight) hours.   Yes [provider]  ?potassium chloride (KLOR-CON) 10 MEQ tablet TAKE 1 TABLET (10 MEQ TOTAL) BY MOUTH DAILY. TAKE WITH LASIX 05/30/21  Yes Adrian Prows, MD  ?rOPINIRole (REQUIP) 1 MG tablet Take 1 mg by mouth at bedtime.   Yes [provider]  ?acetaminophen (TYLENOL) 325 MG tablet Take 2 tablets (650 mg total) by mouth every 6 (six) hours as needed for mild pain (or Fever >/= 101). ?Patient not taking: Reported on 09/18/2021 11/09/20   Raiford Noble Latif, DO  ?guaiFENesin (MUCINEX) 600 MG 12 hr tablet Take 600 mg by mouth 2 (two) times daily. ?Patient not taking: Reported on 06/21/2021    [provider]  ? ? ?Scheduled Meds: ? atorvastatin  10 mg Oral Daily  ? buPROPion  150 mg Oral Daily  ? cyanocobalamin  1,000 mcg Intramuscular Once  ? ipratropium-albuterol  3 mL Nebulization TID  ? mirtazapine  30 mg Oral QHS  ? oxyCODONE  30 mg Oral Q12H  ? pantoprazole (PROTONIX) IV  40 mg Intravenous Q12H  ? rOPINIRole  1 mg Oral QHS  ? ?Continuous Infusions: ? lactated ringers    ? ?PRN Meds:.acetaminophen **OR** acetaminophen, albuterol, ALPRAZolam, ondansetron **OR** ondansetron (ZOFRAN) IV ? ?Allergies as of 09/18/2021  ? (No Known Allergies)  ? ? ?Family History  ?Problem Relation Age of Onset  ? Diabetes Mother   ? Hypertension Mother   ? Cancer Mother   ?     leukemia  ? Sudden death Mother   ? Heart attack Mother   ? Anesthesia problems Neg Hx   ?  Hypotension Neg Hx   ? Malignant hyperthermia Neg Hx   ? Pseudochol deficiency Neg Hx   ? Breast cancer Neg Hx   ? Lung disease Neg Hx   ? ? ?Social History  ? ?Socioeconomic History  ? Marital status: Divorced  ?  Spouse name: Not on file  ? Number of children: 2  ? Years of education: Not on file  ? Highest education level: Not on file  ?Occupational History  ? Not on file  ?Tobacco Use  ? Smoking status: Former  ?  Packs/day: 0.25  ?  Years: 30.00  ?  Pack years: 7.50  ?  Types: Cigarettes  ?  Quit date: 10/31/2019  ?  Years since quitting: 1.8  ? Smokeless tobacco: Never  ? Tobacco comments:  ?  had 6 cigarettes recently 03-10-20  ?Vaping  Use  ? Vaping Use: Never used  ?Substance and Sexual Activity  ? Alcohol use: Yes  ?  Comment: rarely   ? Drug use: No  ? Sexual activity: Not Currently  ?Other Topics Concern  ? Not on file  ?Social History Narrative  ? Lives alone in a one story home.  Has 2 children.    ? On disability for low back pain since the age of 26.    ? Education: high school.  ? ?Social Determinants of Health  ? ?Financial Resource Strain: Not on file  ?Food Insecurity: Not on file  ?Transportation Needs: Not on file  ?Physical Activity: Not on file  ?Stress: Not on file  ?Social Connections: Not on file  ?Intimate Partner Violence: Not on file  ? ? ?Review of Systems: Review of Systems  ?Constitutional:  Positive for malaise/fatigue. Negative for chills and fever.  ?HENT:  Negative for hearing loss and tinnitus.   ?Eyes:  Negative for blurred vision and double vision.  ?Respiratory:  Negative for cough and sputum production.   ?Cardiovascular:  Negative for chest pain and palpitations.  ?Gastrointestinal:  Positive for abdominal pain, diarrhea, melena and nausea. Negative for blood in stool, constipation, heartburn and vomiting.  ?Genitourinary:  Negative for dysuria and urgency.  ?Musculoskeletal:  Positive for joint pain. Negative for myalgias.  ?Skin:  Negative for itching and rash.   ?Neurological:  Negative for dizziness and headaches.  ?Endo/Heme/Allergies:  Negative for polydipsia. Does not bruise/bleed easily.  ?Psychiatric/Behavioral:  Negative for depression and substance abuse.   ? ? ?Phys

## 2021-09-19 NOTE — Assessment & Plan Note (Addendum)
Stable without wheezing on admission.  Continue duonebs and albuterol as needed and home 4 L supplemental O2 via [place]. ?

## 2021-09-19 NOTE — Progress Notes (Signed)
?Progress Note ? ? ?Patient: [name] DOB: [birth year] DOA: 09/18/2021     0 ?DOS: the patient was seen and examined on 09/19/2021 ?  ?Brief hospital course: ?[name] is a 76 y.o. female with medical history significant for COPD, chronic respiratory failure with hypoxia on 3-4 L O2 via [place], CKD stage IIIb, PAD, HTN, colon cancer s/p right hemicolectomy, iron deficiency anemia, chronic back pain, depression/anxiety who is admitted with anemia suspected due to upper GI bleeding. ? ?Assessment and Plan: ?* Anemia ?Admission Hemoglobin 9.8 compared to prior baseline of 14 in June 2022.  GI bleeding with positive FOBT and elevated BUN 92. ?-Continue IV Protonix 40 mg twice daily ?-Hold aspirin and other blood thinners, avoid NSAIDs ?-GI consulted ?-Hgb down to 6.8 with IV fluids ?-She does not report further bowel movements since arrival to hospital ?-1 unit prbc ordered ?-continue to follow hemoglobin ? ?COPD (chronic obstructive pulmonary disease) ([place]) ?Stable without wheezing on admission.  Continue duonebs and albuterol as needed and home 4 L supplemental O2 via [place]. ? ?Chronic respiratory failure with hypoxia (HCC) ?Chronically on 4 L O2 via [place]. ? ?Chronic kidney disease, stage 3b ([place]) ?Stable, continue to monitor. ?Elevated BUN likely related to upper GI bleeding ? ?HTN (hypertension) ?Hold antihypertensives with BP on the lower side. ? ?PAD (peripheral artery disease) ([place]) ?Hold aspirin, continue atorvastatin. ? ?B12 deficiency ?Noted on anemia panel ?B12 163 ?Folate is not deficient ?Will start replacement of b12 ? ?Somnolence ?Patient says she has not slept in several days ?She is also on several sedating meds (these have been dose reduced and have holding parameters now) ?Uremia could also be contributing to somnolence ?With history of COPD, will check ABG to follow pCO2 ?She does wake up easily and answers questions appropriately, just falls back asleep in conversation ? ?Chronic  back pain ?She is on large doses of gabapentin and OxyContin at home. ?-since she is currently somnolent, will reduce doses and have holding parameters ? ?Anxiety and depression ?Continue home bupropion, mirtazapine, Xanax as needed with hold parameters. ? ?GI bleed ?Reported dark stools over the past week.  ?FOBT + ?BUN elevated indicating upper GI bleeding ?GI consulted ?She is on protonix ?Keep npo until seen by GI ?She does not report any bowel movements since arrival to the hospital ? ? ? ? ?  ? ?Subjective: Patient is somnolent.  Describes nausea, but no vomiting.  She has not had a bowel movement since she is come to the hospital.  Feels that her breathing is at baseline. ? ?Physical Exam: ?Vitals:  ? 09/19/21 0600 09/19/21 0615 09/19/21 0645 09/19/21 0840  ?BP: (!) 106/50 (!) 101/48 (!) 153/72 (!) 104/53  ?Pulse: 62 66 67 88  ?Resp: 13 (!) '22 15 17  '$ ?Temp:    97.7 ?F (36.5 ?C)  ?TempSrc:    Oral  ?SpO2: 100% 100% 100% 100%  ? ?General exam: Alert, awake, oriented x 3 ?Respiratory system: Clear to auscultation. Respiratory effort normal. ?Cardiovascular system:RRR. No murmurs, rubs, gallops. ?Gastrointestinal system: Abdomen is nondistended, soft and nontender. No organomegaly or masses felt. Normal bowel sounds heard. ?Central nervous system: Alert and oriented. No focal neurological deficits. ?Extremities: No C/C/E, +pedal pulses ?Skin: No rashes, lesions or ulcers ?Psychiatry: Judgement and insight appear normal. Mood & affect appropriate.  ? ?Data Reviewed: ? ?Hemoglobin down to 6.8.  BUN elevated at 94.  FOBT positive. ? ?Family Communication: No family present ? ?Disposition: ?Status is: Observation ?The patient will  require care spanning > 2 midnights and should be moved to inpatient because: Will likely need GI evaluation with EGD as well as volume resuscitation/transfusion of PRBC ? Planned Discharge Destination: Home ? ? ? ?Time spent: 35 minutes ? ?Author: ?Kathie Dike, MD ?09/19/2021 11:01  AM ? ?For on call review [email or web address].  ?

## 2021-09-19 NOTE — Assessment & Plan Note (Signed)
Continue home bupropion, mirtazapine, Xanax as needed with hold parameters. ?

## 2021-09-19 NOTE — Assessment & Plan Note (Addendum)
She is on large doses of gabapentin and OxyContin at home. ?-continue home regimen ?

## 2021-09-19 NOTE — Progress Notes (Addendum)
Spoke with MD Roderic Palau- MD verbally cancelled ABG. RN aware. RN encouraged to contact RT if PT appears confused and or lethargic. PT currently is awake and oriented times 4, no respiratory distress noted at this time. ?

## 2021-09-19 NOTE — Assessment & Plan Note (Signed)
Hold aspirin, continue atorvastatin. ?

## 2021-09-19 NOTE — Consult Note (Signed)
Referring Provider: TRH ?Primary Care Physician:  Maude Leriche, PA-C ?Primary Gastroenterologist: [facility]/ Dr. Paulita Fujita ? ?Reason for Consultation:, Abdominal pain, dark stools nausea ? ?HPI: [name] is a 76 y.o. female with medical history significant for COPD, chronic respiratory failure with hypoxia on 3-4 L O2 via [place], CKD stage IIIb, PAD, HTN, colon cancer s/p right hemicolectomy, iron deficiency anemia, chronic back pain, depression/anxiety who presented to the ED for evaluation of nausea, abdominal pain, dark stools. ? ?Note she began to have dark brown loose stools today.  She also began to have nausea and epigastric pain at that time.  Notes pain was worse with eating.  She denies previous episodes of melena.  Denies hematemesis or hematochezia.  ? ?Notes she has been increasingly fatigued for the last couple of weeks, she has also had difficulty sleeping at night. ? ?Denies blood thinner use, denies NSAID use other than daily baby aspirin, denies frequent alcohol use. ?Quit smoking 1 year ago. ? ?EGD 06/16/2015 ?Normal esophagus ?Erosive gastropathy biopsy negative for H. pylori ?Normal duodenum ?Otherwise normal exam ? ?Colonoscopy 06/16/2015 ?Ileocolonic anastomosis  ?otherwise normal exam ?Recall 5 years for surveillance ? ?Patient with right hemicolectomy for colon cancer in 2012.  Follow-up colonoscopy 2014 showed couple small adenomatous polyps which were removed. ? ?Last seen with Dr. Paulita Fujita 06/10/2015. ? ?Past Medical History:  ?Diagnosis Date  ? Acute renal failure superimposed on stage 3 chronic kidney disease ([place]) 10/27/2014  ? Back pain, chronic   ? Breast cancer ([place]) 09/09/10  ? Cancer of colon ([place]) 05/24/2011  ? Centrilobular emphysema ([place]) 07/15/2018  ? CKD (chronic kidney disease), stage III ([place])   ? Claudication, intermittent ([place]) 07/15/2018  ? Depression   ? GIB (gastrointestinal bleeding)   ? H/O tobacco use, presenting hazards to health Quit Dec 2019 05/03/2011  ? 50 years, up  to 3PPD quit last year.   ? Hx of varicose veins of lower extremity 07/15/2018  ? Hypertension   ? SDH (subdural hematoma) (HCC)   ? SOB (shortness of breath) 07/15/2018  ? ? ?Past Surgical History:  ?Procedure Laterality Date  ? ABDOMINAL AORTOGRAM W/LOWER EXTREMITY Left 03/18/2019  ? Procedure: ABDOMINAL AORTOGRAM W/LOWER EXTREMITY;  Surgeon: Adrian Prows, MD;  Location: [place];  Service: Cardiovascular;  Laterality: Left;  ? BACK SURGERY    ? BREAST LUMPECTOMY Right 10/10/2010  ? COLON SURGERY  04/2011  ? COLONOSCOPY  05/03/2011  ? Procedure: COLONOSCOPY;  Surgeon: Jeryl Columbia, MD;  Location: [facility];  Service: Endoscopy;  Laterality: N/A;  ? JOINT REPLACEMENT    ? R&L total shoulder replacements  ? LUMBAR DISC SURGERY    ? PARTIAL HYSTERECTOMY    ? PERIPHERAL VASCULAR INTERVENTION  03/18/2019  ? Procedure: PERIPHERAL VASCULAR INTERVENTION;  Surgeon: Adrian Prows, MD;  Location: [place] [facility];  Service: Cardiovascular;;  attempted left SFA  ? SHOULDER SURGERY    ? TUMOR REMOVAL  Tumor removed R breast  ? ? ?Prior to Admission medications   ?Medication Sig Start Date End Date Taking? Authorizing Provider  ?ALPRAZolam (XANAX) 1 MG tablet Take 1 tablet (1 mg total) by mouth 2 (two) times daily as needed. ?Patient taking differently: Take 1 mg by mouth 3 (three) times daily as needed for anxiety. 11/09/20  Yes Sheikh, Omair Latif, DO  ?amLODipine (NORVASC) 5 MG tablet Take 1 tablet (5 mg total) by mouth daily. 01/31/21  Yes Adrian Prows, MD  ?aspirin EC 81 MG tablet Take 81  mg by mouth daily. Swallow whole.   Yes [provider]  ?atorvastatin (LIPITOR) 10 MG tablet TAKE 1 TABLET BY MOUTH EVERY DAY ?Patient taking differently: Take 10 mg by mouth daily. 02/14/21  Yes Adrian Prows, MD  ?buPROPion (WELLBUTRIN XL) 150 MG 24 hr tablet Take 150 mg by mouth daily.   Yes [provider]  ?furosemide (LASIX) 20 MG tablet Take 1 tablet (20 mg total) by mouth daily. 02/24/21  Yes Adrian Prows, MD   ?gabapentin (NEURONTIN) 300 MG capsule Take 300 mg by mouth 3 (three) times daily. 04/01/19  Yes [provider]  ?losartan-hydrochlorothiazide (HYZAAR) 100-12.5 MG tablet TAKE 1 TABLET BY MOUTH EVERY DAY 05/11/21  Yes Adrian Prows, MD  ?mirtazapine (REMERON) 30 MG tablet Take 30 mg by mouth at bedtime. 04/22/16  Yes [provider]  ?Multiple Vitamins-Minerals (MULTI FOR HER 50+) TABS Take 1 tablet by mouth daily.   Yes [provider]  ?oxyCODONE (OXYCONTIN) 60 MG 12 hr tablet Take 1 tablet by mouth every 8 (eight) hours.   Yes [provider]  ?potassium chloride (KLOR-CON) 10 MEQ tablet TAKE 1 TABLET (10 MEQ TOTAL) BY MOUTH DAILY. TAKE WITH LASIX 05/30/21  Yes Adrian Prows, MD  ?rOPINIRole (REQUIP) 1 MG tablet Take 1 mg by mouth at bedtime.   Yes [provider]  ?acetaminophen (TYLENOL) 325 MG tablet Take 2 tablets (650 mg total) by mouth every 6 (six) hours as needed for mild pain (or Fever >/= 101). ?Patient not taking: Reported on 09/18/2021 11/09/20   Raiford Noble Latif, DO  ?guaiFENesin (MUCINEX) 600 MG 12 hr tablet Take 600 mg by mouth 2 (two) times daily. ?Patient not taking: Reported on 06/21/2021    [provider]  ? ? ?Scheduled Meds: ? atorvastatin  10 mg Oral Daily  ? buPROPion  150 mg Oral Daily  ? cyanocobalamin  1,000 mcg Intramuscular Once  ? ipratropium-albuterol  3 mL Nebulization TID  ? mirtazapine  30 mg Oral QHS  ? oxyCODONE  30 mg Oral Q12H  ? pantoprazole (PROTONIX) IV  40 mg Intravenous Q12H  ? rOPINIRole  1 mg Oral QHS  ? ?Continuous Infusions: ? lactated ringers    ? ?PRN Meds:.acetaminophen **OR** acetaminophen, albuterol, ALPRAZolam, ondansetron **OR** ondansetron (ZOFRAN) IV ? ?Allergies as of 09/18/2021  ? (No Known Allergies)  ? ? ?Family History  ?Problem Relation Age of Onset  ? Diabetes Mother   ? Hypertension Mother   ? Cancer Mother   ?     leukemia  ? Sudden death Mother   ? Heart attack Mother   ? Anesthesia problems Neg Hx   ?  Hypotension Neg Hx   ? Malignant hyperthermia Neg Hx   ? Pseudochol deficiency Neg Hx   ? Breast cancer Neg Hx   ? Lung disease Neg Hx   ? ? ?Social History  ? ?Socioeconomic History  ? Marital status: Divorced  ?  Spouse name: Not on file  ? Number of children: 2  ? Years of education: Not on file  ? Highest education level: Not on file  ?Occupational History  ? Not on file  ?Tobacco Use  ? Smoking status: Former  ?  Packs/day: 0.25  ?  Years: 30.00  ?  Pack years: 7.50  ?  Types: Cigarettes  ?  Quit date: 10/31/2019  ?  Years since quitting: 1.8  ? Smokeless tobacco: Never  ? Tobacco comments:  ?  had 6 cigarettes recently 03-10-20  ?Vaping  Use  ? Vaping Use: Never used  ?Substance and Sexual Activity  ? Alcohol use: Yes  ?  Comment: rarely   ? Drug use: No  ? Sexual activity: Not Currently  ?Other Topics Concern  ? Not on file  ?Social History Narrative  ? Lives alone in a one story home.  Has 2 children.    ? On disability for low back pain since the age of 76.    ? Education: high school.  ? ?Social Determinants of Health  ? ?Financial Resource Strain: Not on file  ?Food Insecurity: Not on file  ?Transportation Needs: Not on file  ?Physical Activity: Not on file  ?Stress: Not on file  ?Social Connections: Not on file  ?Intimate Partner Violence: Not on file  ? ? ?Review of Systems: Review of Systems  ?Constitutional:  Positive for malaise/fatigue. Negative for chills and fever.  ?HENT:  Negative for hearing loss and tinnitus.   ?Eyes:  Negative for blurred vision and double vision.  ?Respiratory:  Negative for cough and sputum production.   ?Cardiovascular:  Negative for chest pain and palpitations.  ?Gastrointestinal:  Positive for abdominal pain, diarrhea, melena and nausea. Negative for blood in stool, constipation, heartburn and vomiting.  ?Genitourinary:  Negative for dysuria and urgency.  ?Musculoskeletal:  Positive for joint pain. Negative for myalgias.  ?Skin:  Negative for itching and rash.   ?Neurological:  Negative for dizziness and headaches.  ?Endo/Heme/Allergies:  Negative for polydipsia. Does not bruise/bleed easily.  ?Psychiatric/Behavioral:  Negative for depression and substance abuse.   ? ? ?Phys

## 2021-09-19 NOTE — Assessment & Plan Note (Signed)
Hold antihypertensives with BP on the lower side. ?

## 2021-09-19 NOTE — Assessment & Plan Note (Deleted)
Patient says she has not slept in several days ?She is also on several sedating meds (these have been dose reduced and have holding parameters now) ?Uremia could also be contributing to somnolence ?With history of COPD, will check ABG to follow pCO2 ?She does wake up easily and answers questions appropriately, just falls back asleep in conversation ?

## 2021-09-20 ENCOUNTER — Inpatient Hospital Stay (HOSPITAL_COMMUNITY): Payer: Medicare Other | Admitting: Certified Registered Nurse Anesthetist

## 2021-09-20 ENCOUNTER — Encounter (HOSPITAL_COMMUNITY)
Admission: EM | Disposition: A | Discharge status: Home / Self Care | Payer: Self-pay | Source: Home / Self Care | Attending: Internal Medicine

## 2021-09-20 ENCOUNTER — Encounter (HOSPITAL_COMMUNITY): Payer: Self-pay | Admitting: Internal Medicine

## 2021-09-20 DIAGNOSIS — R1310 Dysphagia, unspecified: Secondary | ICD-10-CM

## 2021-09-20 DIAGNOSIS — E538 Deficiency of other specified B group vitamins: Secondary | ICD-10-CM | POA: Diagnosis not present

## 2021-09-20 DIAGNOSIS — J449 Chronic obstructive pulmonary disease, unspecified: Secondary | ICD-10-CM

## 2021-09-20 DIAGNOSIS — K254 Chronic or unspecified gastric ulcer with hemorrhage: Secondary | ICD-10-CM

## 2021-09-20 DIAGNOSIS — K2901 Acute gastritis with bleeding: Secondary | ICD-10-CM

## 2021-09-20 DIAGNOSIS — N1832 Chronic kidney disease, stage 3b: Secondary | ICD-10-CM | POA: Diagnosis not present

## 2021-09-20 DIAGNOSIS — D62 Acute posthemorrhagic anemia: Secondary | ICD-10-CM | POA: Diagnosis not present

## 2021-09-20 DIAGNOSIS — G2581 Restless legs syndrome: Secondary | ICD-10-CM

## 2021-09-20 DIAGNOSIS — K449 Diaphragmatic hernia without obstruction or gangrene: Secondary | ICD-10-CM

## 2021-09-20 DIAGNOSIS — G894 Chronic pain syndrome: Secondary | ICD-10-CM | POA: Diagnosis not present

## 2021-09-20 HISTORY — PX: BIOPSY: SHX5522

## 2021-09-20 HISTORY — PX: ESOPHAGOGASTRODUODENOSCOPY (EGD) WITH PROPOFOL: SHX5813

## 2021-09-20 LAB — CBC
HCT: 22.7 % — ABNORMAL LOW (ref 36.0–46.0)
Hemoglobin: 7.4 g/dL — ABNORMAL LOW (ref 12.0–15.0)
MCH: 31.6 pg (ref 26.0–34.0)
MCHC: 32.6 g/dL (ref 30.0–36.0)
MCV: 97 fL (ref 80.0–100.0)
Platelets: 121 10*3/uL — ABNORMAL LOW (ref 150–400)
RBC: 2.34 MIL/uL — ABNORMAL LOW (ref 3.87–5.11)
RDW: 14.6 % (ref 11.5–15.5)
WBC: 2.7 10*3/uL — ABNORMAL LOW (ref 4.0–10.5)
nRBC: 0 % (ref 0.0–0.2)

## 2021-09-20 LAB — BASIC METABOLIC PANEL
Anion gap: 3 — ABNORMAL LOW (ref 5–15)
BUN: 58 mg/dL — ABNORMAL HIGH (ref 8–23)
CO2: 32 mmol/L (ref 22–32)
Calcium: 10 mg/dL (ref 8.9–10.3)
Chloride: 108 mmol/L (ref 98–111)
Creatinine, Ser: 1.15 mg/dL — ABNORMAL HIGH (ref 0.44–1.00)
GFR, Estimated: 50 mL/min — ABNORMAL LOW (ref >60)
Glucose, Bld: 89 mg/dL (ref 70–99)
Potassium: 4.2 mmol/L (ref 3.5–5.1)
Sodium: 143 mmol/L (ref 135–145)

## 2021-09-20 SURGERY — ESOPHAGOGASTRODUODENOSCOPY (EGD) WITH PROPOFOL
Anesthesia: General

## 2021-09-20 MED ORDER — PROPOFOL 500 MG/50ML IV EMUL
INTRAVENOUS | Status: DC | PRN
Start: 2021-09-20 — End: 2021-09-20
  Administered 2021-09-20: 100 ug/kg/min via INTRAVENOUS

## 2021-09-20 MED ORDER — FENTANYL CITRATE PF 50 MCG/ML IJ SOSY
25.0000 ug | PREFILLED_SYRINGE | Freq: Once | INTRAMUSCULAR | Status: AC
  Administered 2021-09-20: 25 ug via INTRAVENOUS
  Filled 2021-09-20: qty 1

## 2021-09-20 MED ORDER — SODIUM CHLORIDE 0.9 % IV SOLN: INTRAVENOUS | Status: DC

## 2021-09-20 MED ORDER — LIDOCAINE 2% (20 MG/ML) 5 ML SYRINGE
INTRAMUSCULAR | Status: DC | PRN
Start: 2021-09-20 — End: 2021-09-20
  Administered 2021-09-20: 50 mg via INTRAVENOUS

## 2021-09-20 MED ORDER — TRAMADOL HCL 50 MG PO TABS
25.0000 mg | ORAL_TABLET | Freq: Once | ORAL | Status: AC | PRN
  Administered 2021-09-20: 25 mg via ORAL
  Filled 2021-09-20: qty 1

## 2021-09-20 MED ORDER — MORPHINE SULFATE (PF) 2 MG/ML IV SOLN
1.0000 mg | Freq: Once | INTRAVENOUS | Status: AC
  Administered 2021-09-20: 1 mg via INTRAVENOUS
  Filled 2021-09-20: qty 1

## 2021-09-20 MED ORDER — PROPOFOL 500 MG/50ML IV EMUL
INTRAVENOUS | Status: AC
  Filled 2021-09-20: qty 100

## 2021-09-20 MED ORDER — LACTATED RINGERS IV SOLN
INTRAVENOUS | Status: AC | PRN
Start: 2021-09-20 — End: 2021-09-20
  Administered 2021-09-20: 10 mL/h via INTRAVENOUS

## 2021-09-20 MED ORDER — PROPOFOL 10 MG/ML IV BOLUS
INTRAVENOUS | Status: DC | PRN
  Administered 2021-09-20: 25 mg via INTRAVENOUS

## 2021-09-20 MED ORDER — VITAMIN B-12 1000 MCG PO TABS
1000.0000 ug | ORAL_TABLET | Freq: Every day | ORAL | Status: DC
  Administered 2021-09-21 – 2021-09-23 (×3): 1000 ug via ORAL
  Filled 2021-09-20 (×3): qty 1

## 2021-09-20 MED ORDER — LACTATED RINGERS IV SOLN: INTRAVENOUS | Status: DC

## 2021-09-20 SURGICAL SUPPLY — 15 items

## 2021-09-20 NOTE — Interval H&P Note (Signed)
History and Physical Interval Note: ? ?09/20/2021 ?12:01 PM ? ?[name]  has presented today for surgery, with the diagnosis of Dysphagia; Melena.  The various methods of treatment have been discussed with the patient and family. After consideration of risks, benefits and other options for treatment, the patient has consented to  Procedure(s): ?ESOPHAGOGASTRODUODENOSCOPY (EGD) WITH PROPOFOL (N/A) as a surgical intervention.  The patient's history has been reviewed, patient examined, no change in status, stable for surgery.  I have reviewed the patient's chart and labs.  Questions were answered to the patient's satisfaction.   ? ? ?Lear Ng ? ? ?

## 2021-09-20 NOTE — Care Plan (Signed)
Patient s/p endo procedure ?Tolerated well - MD into talk with patient ?Report called to RN ?Report printed in front of chart ? ?

## 2021-09-20 NOTE — Op Note (Signed)
[facility] ?Patient Name: [name] ?Procedure Date: 09/20/2021 ?MRN: [id number] ?Attending MD: Lear Ng , MD ?Date of Birth: 1946/05/14 ?CSN: [id number] ?Age: 76 ?Admit Type: Inpatient ?Procedure:                Upper GI endoscopy ?Indications:              Acute post hemorrhagic anemia, Dysphagia, Melena ?Providers:                Lear Ng, MD, Ladoris Gene, RN ?Referring MD:             hospital team ?Medicines:                Propofol per Anesthesia, Monitored Anesthesia Care ?Complications:            No immediate complications. ?Estimated Blood Loss:     Estimated blood loss was minimal. ?Procedure:                Pre-Anesthesia Assessment: ?                          - Prior to the procedure, a History and Physical  ?                          was performed, and patient medications and  ?                          allergies were reviewed. The patient's tolerance of  ?                          previous anesthesia was also reviewed. The risks  ?                          and benefits of the procedure and the sedation  ?                          options and risks were discussed with the patient.  ?                          All questions were answered, and informed consent  ?                          was obtained. Prior Anticoagulants: The patient has  ?                          taken no previous anticoagulant or antiplatelet  ?                          agents. ASA Grade Assessment: IV - A patient with  ?                          severe systemic disease that is a constant threat  ?                          to life. After reviewing the risks and benefits,  ?  the patient was deemed in satisfactory condition to  ?                          undergo the procedure. ?                          After obtaining informed consent, the endoscope was  ?                          passed under direct vision. Throughout the  ?                          procedure, the  patient's blood pressure, pulse, and  ?                          oxygen saturations were monitored continuously. The  ?                          [id number]-[id number] ([id number]) Olympus endoscope was introduced  ?                          through the mouth, and advanced to the second part  ?                          of duodenum. The upper GI endoscopy was  ?                          accomplished without difficulty. The patient  ?                          tolerated the procedure well. ?Scope In: ?Scope Out: ?Findings: ?     The examined esophagus was normal. ?     The Z-line was regular and was found 40 cm from the incisors. ?     One non-bleeding cratered gastric ulcer with no stigmata of bleeding was  ?     found in the gastric antrum. The lesion was 10 mm in largest dimension. ?     Segmental moderate inflammation characterized by congestion (edema),  ?     erosions and erythema was found in the gastric antrum. Biopsies were  ?     taken with a cold forceps for histology. Estimated blood loss was  ?     minimal. ?     A medium-sized hiatal hernia was present. ?     The examined duodenum was normal. ?Impression:               - Normal esophagus. ?                          - Z-line regular, 40 cm from the incisors. ?                          - Non-bleeding gastric ulcer with no stigmata of  ?                          bleeding. ?                          -  Acute gastritis. Biopsied. ?                          - Medium-sized hiatal hernia. ?                          - Normal examined duodenum. ?Moderate Sedation: ?     N/A - MAC procedure ?Recommendation:           - Clear liquid diet. ?                          - Await pathology results. ?Procedure Code(s):        --- Professional --- ?                          [phone], Esophagogastroduodenoscopy, flexible,  ?                          transoral; with biopsy, single or multiple ?Diagnosis Code(s):        --- Professional --- ?                          K92.1, Melena (includes  Hematochezia) ?                          K25.9, Gastric ulcer, unspecified as acute or  ?                          chronic, without hemorrhage or perforation ?                          K29.00, Acute gastritis without bleeding ?                          K44.9, Diaphragmatic hernia without obstruction or  ?                          gangrene ?                          R13.10, Dysphagia, unspecified ?                          D62, Acute posthemorrhagic anemia ?CPT copyright 2019 [employer]. All rights reserved. ?The codes documented in this report are preliminary and upon coder review may  ?be revised to meet current compliance requirements. ?Lear Ng, MD ?09/20/2021 12:26:43 PM ?This report has been signed electronically. ?Number of Addenda: 0 ?

## 2021-09-20 NOTE — Transfer of Care (Signed)
Immediate Anesthesia Transfer of Care Note ? ?Patient: [name] ? ?Procedure(s) Performed: ESOPHAGOGASTRODUODENOSCOPY (EGD) WITH PROPOFOL ?BIOPSY ? ?Patient Location: Endoscopy Unit ? ?Anesthesia Type:MAC ? ?Level of Consciousness: awake, alert , oriented and patient cooperative ? ?Airway & Oxygen Therapy: Patient Spontanous Breathing and Patient connected to face mask ? ?Post-op Assessment: Report given to RN and Post -op Vital signs reviewed and stable ? ?Post vital signs: Reviewed and stable ? ?Last Vitals:  ?Vitals Value Taken Time  ?BP    ?Temp    ?Pulse    ?Resp    ?SpO2    ? ? ?Last Pain:  ?Vitals:  ? 09/20/21 1126  ?TempSrc: Temporal  ?PainSc: 2   ?   ? ?Patients Stated Pain Goal: 8 (09/18/21 1839) ? ?Complications: No notable events documented. ?

## 2021-09-20 NOTE — Discharge Instructions (Signed)

## 2021-09-20 NOTE — Assessment & Plan Note (Signed)
Continue on requip ?

## 2021-09-20 NOTE — Anesthesia Procedure Notes (Signed)
Procedure Name: [place] ?Date/Time: 09/20/2021 12:00 PM ?Performed by: Claudia Desanctis, CRNA ?Pre-anesthesia Checklist: Patient identified, Emergency Drugs available, Suction available and Patient being monitored ?Patient Re-evaluated:Patient Re-evaluated prior to induction ?Oxygen Delivery Method: Simple face mask ? ? ? ? ?

## 2021-09-20 NOTE — Anesthesia Preprocedure Evaluation (Addendum)
Anesthesia Evaluation  ?Patient identified by MRN, date of birth, ID band ?Patient awake ? ? ? ?Reviewed: ?Allergy & Precautions, NPO status , Patient's Chart, lab work & pertinent test results ? ?Airway ?Mallampati: III ? ?TM Distance: >3 FB ?Neck ROM: Full ? ? ? Dental ?no notable dental hx. ?(+) Edentulous Upper, Edentulous Lower ?  ?Pulmonary ?COPD,  COPD inhaler and oxygen dependent, former smoker,  ?Quit smoking 2021 ?chronic respiratory failure with hypoxia on 3-4 L O2 via [place] ?  ?Pulmonary exam normal ?breath sounds clear to auscultation ? ? ? ? ? ? Cardiovascular ?hypertension, Pt. on medications ?+ Peripheral Vascular Disease and +CHF  ?Normal cardiovascular exam ?Rhythm:Regular Rate:Normal ? ?Echo 10/2020 ??1. Left ventricular ejection fraction, by estimation, is 65 to 70%. The  ?left ventricle has normal function. The left ventricle has no regional  ?wall motion abnormalities. Left ventricular diastolic parameters are  ?consistent with Grade I diastolic  ?dysfunction (impaired relaxation).  ??2. Right ventricular systolic function is normal. The right ventricular  ?size is normal. There is normal pulmonary artery systolic pressure.  ??3. Left atrial size was mildly dilated.  ??4. The mitral valve is grossly normal. Trivial mitral valve  ?regurgitation.  ??5. The aortic valve is tricuspid. Aortic valve regurgitation is not  ?visualized.  ??6. The inferior vena cava is normal in size with greater than 50%  ?respiratory variability, suggesting right atrial pressure of 3 mmHg.  ??7. Small PFO suspected.  ?  ?Neuro/Psych ?PSYCHIATRIC DISORDERS Anxiety Depression negative neurological ROS ?   ? GI/Hepatic ?(+)  ?  ?  ? alcohol use, Dysphagia, melena  ?Hx CRC s/p right hemicolectomy ?  ?Endo/Other  ?negative endocrine ROS ? Renal/GU ?Renal diseaseCKD 3, cr 1.15  ?negative genitourinary ?  ?Musculoskeletal ?Chronic back pain on home opiates  ? Abdominal ?  ?Peds ?  Hematology ? ?(+) Blood dyscrasia, anemia , Hb 7.4, plt 121   ?Anesthesia Other Findings ? ? Reproductive/Obstetrics ?negative OB ROS ? ?  ? ? ? ? ? ? ? ? ? ? ? ? ? ?  ?  ? ? ? ? ? ? ? ?Anesthesia Physical ?Anesthesia Plan ? ?ASA: 4 ? ?Anesthesia Plan: General  ? ?Post-op Pain Management:   ? ?Induction:  ? ?PONV Risk Score and Plan: 3 and Propofol infusion, TIVA and Treatment may vary due to age or medical condition ? ?Airway Management Planned: Natural Airway and Simple Face Mask ? ?Additional Equipment: None ? ?Intra-op Plan:  ? ?Post-operative Plan:  ? ?Informed Consent: I have reviewed the patients History and Physical, chart, labs and discussed the procedure including the risks, benefits and alternatives for the proposed anesthesia with the patient or authorized representative who has indicated his/her understanding and acceptance.  ? ?Patient has DNR.  ?Discussed DNR with patient and Continue DNR. ?  ? ? ?Plan Discussed with: CRNA ? ?Anesthesia Plan Comments: (D/w patient, continue to withhold chest compressions and defibrillation. Will allow temporary intubation if needed and IV pressors. )  ? ? ? ? ? ?Anesthesia Quick Evaluation ? ?

## 2021-09-20 NOTE — Progress Notes (Addendum)
?  Progress Note ? ? ?Patient: [name] [id number]:[id number] DOB: [birth year] DOA: 09/18/2021     1 ?DOS: the patient was seen and examined on 09/20/2021 ?  ?Brief hospital course: ?[name] is a 76 y.o. female with medical history significant for COPD, chronic respiratory failure with hypoxia on 3-4 L O2 via [place], CKD stage IIIb, PAD, HTN, colon cancer s/p right hemicolectomy, iron deficiency anemia, chronic back pain, depression/anxiety who is admitted with anemia suspected due to upper GI bleeding. ? ?Assessment and Plan: ?* Acute blood loss anemia ?Admission Hemoglobin 9.8 compared to prior baseline of 14 in June 2022.  GI bleeding with positive FOBT and elevated BUN 92. ?-Hgb down to 6.8 with IV fluids ?-She does not report further bowel movements since arrival to hospital ?-s/p 1 unit prbc with improvement of hemoglobin to 7.8 ?-Current hemoglobin 7.4, may have further downtrend with IV hydration ?-continue to follow hemoglobin, transfuse for hemoglobin <7 ? ?COPD (chronic obstructive pulmonary disease) ([place]) ?Stable without wheezing on admission.  Continue duonebs and albuterol as needed and home 4 L supplemental O2 via [place]. ? ?Chronic respiratory failure with hypoxia (H[facility]) ?Chronically on 4 L O2 via [place]. ? ?Chronic kidney disease, stage 3b ([place]) ?Stable, continue to monitor. ?Elevated BUN likely related to upper GI bleeding, improving ? ?HTN (hypertension) ?Hold antihypertensives with BP on the lower side. ? ?PAD (peripheral artery disease) ([place]) ?Hold aspirin, continue atorvastatin. ? ?Restless leg syndrome ?Continue on requip ? ?B12 deficiency ?Noted on anemia panel ?B12 163 ?Folate is not deficient ?Received IM B12 5/1 ?Will start oral replacement ? ?Chronic back pain ?She is on large doses of gabapentin and OxyContin at home. ?-continue home regimen ? ?Anxiety and depression ?Continue home bupropion, mirtazapine, Xanax as needed with hold parameters. ? ?GI bleed ?Reported dark stools over the past week  prior to admission.  ?FOBT + ?BUN elevated indicating upper GI bleeding ?GI consulted ?She is on protonix ?S/p EGD which showed acute gastritis and non bleeding gastric ulcer ?Restarted on clear liquids, advance as tolerated ? ? ? ? ?  ? ?Subjective: complains of back pain, she reports that she is having cramps in her legs ? ?Physical Exam: ?Vitals:  ? 09/20/21 1230 09/20/21 1236 09/20/21 1238 09/20/21 1240  ?BP: (!) 106/47  (!) 131/46 (!) 131/46  ?Pulse: 64 (!) 55 74 72  ?Resp: 13 (!) '21 17 13  '$ ?Temp:      ?TempSrc:      ?SpO2: 98% 95% 94% 96%  ?Weight:      ?Height:      ? ?General exam: Alert, awake, oriented x 3 ?Respiratory system: diminished breath sounds bilaterally. Respiratory effort normal. ?Cardiovascular system:RRR. No murmurs, rubs, gallops. ?Gastrointestinal system: Abdomen is nondistended, soft and nontender. No organomegaly or masses felt. Normal bowel sounds heard. ?Central nervous system: Alert and oriented. No focal neurological deficits. ?Extremities: No C/C/E, +pedal pulses ?Skin: No rashes, lesions or ulcers ?Psychiatry: anxious  ? ?Data Reviewed: ? ?Hemoglobin stable, BUN trending down ? ?Family Communication: discussed with patient ? ?Disposition: ?Status is: Inpatient ?Remains inpatient appropriate because: continue to monitor hemoglobin, advance diet ? Planned Discharge Destination: Home ? ? ? ?Time spent: 35 minutes ? ?Author: ?Kathie Dike, MD ?09/20/2021 9:49 PM ? ?For on call review [email or web address].  ?

## 2021-09-20 NOTE — Anesthesia Postprocedure Evaluation (Signed)
Anesthesia Post Note ? ?Patient: [name] ? ?Procedure(s) Performed: ESOPHAGOGASTRODUODENOSCOPY (EGD) WITH PROPOFOL ?BIOPSY ? ?  ? ?Patient location during evaluation: PACU ?Anesthesia Type: General ?Level of consciousness: awake and alert ?Pain management: pain level controlled ?Vital Signs Assessment: post-procedure vital signs reviewed and stable ?Respiratory status: spontaneous breathing, nonlabored ventilation and respiratory function stable ?Cardiovascular status: blood pressure returned to baseline and stable ?Postop Assessment: no apparent nausea or vomiting ?Anesthetic complications: no ? ? ?No notable events documented. ? ?Last Vitals:  ?Vitals:  ? 09/20/21 1230 09/20/21 1236  ?BP: (!) 106/47   ?Pulse: 64 (!) 55  ?Resp: 13 (!) 21  ?Temp:    ?SpO2: 98% 95%  ?  ?Last Pain:  ?Vitals:  ? 09/20/21 1224  ?TempSrc: Temporal  ?PainSc:   ? ? ?  ?  ?  ?  ?  ?  ? ?Jarome Matin Layliana Devins ? ? ? ? ?

## 2021-09-21 ENCOUNTER — Encounter (HOSPITAL_COMMUNITY): Payer: Self-pay | Admitting: Gastroenterology

## 2021-09-21 DIAGNOSIS — D62 Acute posthemorrhagic anemia: Secondary | ICD-10-CM | POA: Diagnosis not present

## 2021-09-21 DIAGNOSIS — F32A Depression, unspecified: Secondary | ICD-10-CM

## 2021-09-21 DIAGNOSIS — F419 Anxiety disorder, unspecified: Secondary | ICD-10-CM

## 2021-09-21 DIAGNOSIS — D519 Vitamin B12 deficiency anemia, unspecified: Secondary | ICD-10-CM | POA: Diagnosis not present

## 2021-09-21 DIAGNOSIS — E538 Deficiency of other specified B group vitamins: Secondary | ICD-10-CM | POA: Diagnosis not present

## 2021-09-21 LAB — BASIC METABOLIC PANEL
Anion gap: 5 (ref 5–15)
BUN: 32 mg/dL — ABNORMAL HIGH (ref 8–23)
CO2: 30 mmol/L (ref 22–32)
Calcium: 10.3 mg/dL (ref 8.9–10.3)
Chloride: 107 mmol/L (ref 98–111)
Creatinine, Ser: 1.08 mg/dL — ABNORMAL HIGH (ref 0.44–1.00)
GFR, Estimated: 54 mL/min — ABNORMAL LOW (ref >60)
Glucose, Bld: 103 mg/dL — ABNORMAL HIGH (ref 70–99)
Potassium: 4.6 mmol/L (ref 3.5–5.1)
Sodium: 142 mmol/L (ref 135–145)

## 2021-09-21 LAB — CBC
HCT: 22.4 % — ABNORMAL LOW (ref 36.0–46.0)
Hemoglobin: 7.2 g/dL — ABNORMAL LOW (ref 12.0–15.0)
MCH: 31.6 pg (ref 26.0–34.0)
MCHC: 32.1 g/dL (ref 30.0–36.0)
MCV: 98.2 fL (ref 80.0–100.0)
Platelets: 127 10*3/uL — ABNORMAL LOW (ref 150–400)
RBC: 2.28 MIL/uL — ABNORMAL LOW (ref 3.87–5.11)
RDW: 13.8 % (ref 11.5–15.5)
WBC: 2.7 10*3/uL — ABNORMAL LOW (ref 4.0–10.5)
nRBC: 0 % (ref 0.0–0.2)

## 2021-09-21 LAB — SURGICAL PATHOLOGY

## 2021-09-21 LAB — HEMOGLOBIN AND HEMATOCRIT, BLOOD
HCT: 28.6 % — ABNORMAL LOW (ref 36.0–46.0)
Hemoglobin: 9.1 g/dL — ABNORMAL LOW (ref 12.0–15.0)

## 2021-09-21 LAB — PREPARE RBC (CROSSMATCH)

## 2021-09-21 MED ORDER — POLYETHYLENE GLYCOL 3350 17 G PO PACK
17.0000 g | PACK | Freq: Once | ORAL | Status: AC
  Administered 2021-09-21: 17 g via ORAL
  Filled 2021-09-21: qty 1

## 2021-09-21 MED ORDER — POLYETHYLENE GLYCOL 3350 17 G PO PACK
17.0000 g | PACK | Freq: Every day | ORAL | Status: DC
  Administered 2021-09-21 – 2021-09-23 (×3): 17 g via ORAL
  Filled 2021-09-21 (×3): qty 1

## 2021-09-21 MED ORDER — DOCUSATE SODIUM 100 MG PO CAPS
100.0000 mg | ORAL_CAPSULE | Freq: Two times a day (BID) | ORAL | Status: DC
Start: 2021-09-21 — End: 2021-09-23
  Administered 2021-09-21 – 2021-09-23 (×5): 100 mg via ORAL
  Filled 2021-09-21 (×5): qty 1

## 2021-09-21 MED ORDER — SODIUM CHLORIDE 0.9% IV SOLUTION: Freq: Once | INTRAVENOUS | Status: AC

## 2021-09-21 NOTE — Progress Notes (Signed)
?      ? ? ? [facility] ?                                                                           ? ? ?[name], is a 76 y.o. female, DOB - 08-10-1945, WUJ:[id number] ?Admit date - 09/18/2021    ?Outpatient Primary MD for the patient is Scifres, Earlie Server, PA-C (Inactive) ? ?LOS - 2  days ? ?Chief Complaint  ?Patient presents with  ? Nausea  ?    ? ?Brief summary  ? ?[name] is a 76 y.o. female with medical history significant for COPD, chronic respiratory failure with hypoxia on 3-4 L O2 via , CKD stage IIIb, PAD, HTN, colon cancer s/p right hemicolectomy, iron deficiency anemia, chronic back pain, depression/anxiety who is admitted with anemia suspected due to upper GI bleeding. ? ? ?Assessment & Plan  ? ? ?Principal Problem: ?  Acute blood loss anemia ?-Hemoglobin 6.8 on admission, 9.8 on 4/30, 14.0 on 11/09/2020.  FOBT positive ?-GI was consulted ?-Underwent EGD on 5/2, showed normal esophagus, nonbleeding gastric ulcer, with no stigmata of bleeding, acute gastritis, medium size hiatal hernia, normal duodenum ?-Hemoglobin this morning, 7.2.  Will DC IV fluids ?-Transfuse 1 unit packed RBCs, advance diet to full liquids, tolerated then advance to soft solids. ?-Per GI, conservative management, no plans of colonoscopy ?-Placed on bowel regimen ? ?COPD (chronic obstructive pulmonary disease) ([place]), chronic respiratory failure with hypoxia ?- Stable without wheezing on admission.  ?-Continue home O2 4 L, DuoNebs as needed  ? ?  ?Chronic kidney disease, stage 3b ([place]) ?-Stable, creatinine 1.2 on 5/1, improving to 1.0  ? ?HTN (hypertension) ?-Continue to hold antihypertensives  ?  ?PAD (peripheral artery disease) ([place]) ?-Continue statin  ?-Hold aspirin ? ?Restless leg syndrome ?-Continue Requip ?  ?B12 deficiency ?Noted on anemia panel, B12 163, folate normal 13.4 ?-Continue B12 replacement ?  ?Chronic back pain ?She is on large doses of gabapentin and OxyContin at home. ?-continue home  regimen ?  ?Anxiety and depression ?Continue home bupropion, mirtazapine, Xanax as needed with hold parameters. ?  ? ?Code Status: DNR ?DVT Prophylaxis:  SCDs Start: 09/18/21 2257 ? ? ?Level of Care: Level of care: Med-Surg ?Family Communication: Updated patient.  No family at the bedside ? ? ?Disposition Plan:      Remains inpatient appropriate: Possible DC home in a.m. if H&H stable ? ? ?Procedures:  ?EGD ? ?Consultants:   ?Gastroenterology ? ?Antimicrobials:  ?None ? ? ? ?Medications ? atorvastatin  10 mg Oral Daily  ? buPROPion  150 mg Oral Daily  ? docusate sodium  100 mg Oral BID  ? gabapentin  300 mg Oral TID  ? ipratropium-albuterol  3 mL Nebulization BID  ? mirtazapine  30 mg Oral QHS  ? oxyCODONE  60 mg Oral Q8H  ? pantoprazole (PROTONIX) IV  40 mg Intravenous Q12H  ? polyethylene glycol  17 g Oral Daily  ? rOPINIRole  1 mg Oral QHS  ? vitamin B-12  1,000 mcg Oral Daily  ? ? ? ? ?Subjective:  ? ?[name] was seen and examined today.  No bleeding however had no BM since admission.  No nausea  vomiting or hematemesis.  Diet advanced. ? ?Objective:  ? ?Vitals:  ? 09/21/21 0802 09/21/21 1012 09/21/21 1340 09/21/21 1356  ?BP:   128/60 (!) 116/55  ?Pulse:  70 72 83  ?Resp:   16 18  ?Temp:   98.2 ?F (36.8 ?C) 97.9 ?F (36.6 ?C)  ?TempSrc:   Oral Oral  ?SpO2: 98%  93% 100%  ?Weight:      ?Height:      ? ? ?Intake/Output Summary (Last 24 hours) at 09/21/2021 1453 ?Last data filed at 09/21/2021 0955 ?Gross per 24 hour  ?Intake 1214.03 ml  ?Output --  ?Net 1214.03 ml  ? ? ? ?Wt Readings from Last 3 Encounters:  ?09/20/21 56.6 kg  ?06/21/21 59.9 kg  ?06/10/21 56.2 kg  ? ? ? ?Exam ?General: Alert and oriented x 3, NAD ?Cardiovascular: S1 S2 auscultated,  RRR ?Respiratory: Clear to auscultation bilaterally, no wheezing ?Gastrointestinal: Soft, nontender, nondistended, + bowel sounds ?Ext: no pedal edema bilaterally ?Neuro: Strength 5/5 upper and lower extremities bilaterally ?Psych: Normal affect and demeanor, alert  and oriented x3  ? ? ? ?Data Reviewed:  I have personally reviewed following labs  ? ? ?CBC ?Lab Results  ?Component Value Date  ? WBC 2.7 (L) 09/21/2021  ? RBC 2.28 (L) 09/21/2021  ? HGB 7.2 (L) 09/21/2021  ? HCT 22.4 (L) 09/21/2021  ? MCV 98.2 09/21/2021  ? MCH 31.6 09/21/2021  ? PLT 127 (L) 09/21/2021  ? MCHC 32.1 09/21/2021  ? RDW 13.8 09/21/2021  ? LYMPHSABS 0.4 (L) 11/09/2020  ? MONOABS 0.2 11/09/2020  ? EOSABS 0.0 11/09/2020  ? BASOSABS 0.0 11/09/2020  ? ? ? ?Last metabolic panel ?Lab Results  ?Component Value Date  ? NA 142 09/21/2021  ? K 4.6 09/21/2021  ? CL 107 09/21/2021  ? CO2 30 09/21/2021  ? BUN 32 (H) 09/21/2021  ? CREATININE 1.08 (H) 09/21/2021  ? GLUCOSE 103 (H) 09/21/2021  ? GFRNONAA 54 (L) 09/21/2021  ? GFRAA 40 (L) 11/15/2019  ? CALCIUM 10.3 09/21/2021  ? PHOS 3.4 11/09/2020  ? PROT 4.9 (L) 09/19/2021  ? ALBUMIN 2.8 (L) 09/19/2021  ? LABGLOB 1.8 06/12/2019  ? AGRATIO 2.4 (H) 06/12/2019  ? BILITOT 0.4 09/19/2021  ? ALKPHOS 52 09/19/2021  ? AST 23 09/19/2021  ? ALT 15 09/19/2021  ? ANIONGAP 5 09/21/2021  ? ? ?CBG (last 3)  ?Recent Labs  ?  09/19/21 ?1706  ?GLUCAP 111*  ?  ? ? ?Coagulation Profile: ?Recent Labs  ?Lab 09/19/21 ?0605  ?INR 1.2  ? ? ? ?Radiology Studies: I have personally reviewed the imaging studies  ?No results found. ? ? ? ? ?Estill Cotta M.D. ?[facility] ?09/21/2021, 2:53 PM ? ?Available via Epic secure chat 7am-7pm ?After 7 pm, please refer to night coverage provider listed on amion. ? ? ? ?

## 2021-09-21 NOTE — Evaluation (Signed)
Physical Therapy Evaluation ?Patient Details ?Name: [name] ?MRN: [id number] ?DOB: 07/04/1945 ?Today's Date: 09/21/2021 ? ?History of Present Illness ? Pt is a 76yo female presenting to [facility] ED on 09/18/21 with chief complaint of nausea, abdominal pain, and melena; found to be anemic and received blood transfusion; currently suspect GI bleeding. Of note: pt had Upper GI endoscopy 5/2 found non-bleeding gastric ulcer and acute gastritis.  PMH: anxiety&depression, chronic back pain, CKD w/ARF, hx of breast ca & colon ca, emphysema on 3-4LO2 via [place], intermittent claudication, depression, HTN, subdural hematoma, back surgery, R&L TSA. ?  ?Clinical Impression ? Pt presenting with the problems listed above and functional impairments below. Pt reports modified independence with mobility at baseline using SPC or RW depending on pain levels in bilateral hips. Pt has functional ROM in her extremities and has functional strength, though fatigues quickly. Required min assist for bed mobility and transfers, min guard for in-room ambulation ~91f limited by pain, fatigue, and O2 tubing. Pt on 4LO2 via [place], SpO2 ranged 89-99%, pt required moderate verbal cuing for pursed lip breathing to recover post-ambulation. Discussed skilled therapy options post-hospital stay and pt does not have appropriate level of supervision/assist at home currently to recommend HHPT and has functional limitations that preclude safe entry via the multiple steps present; discussed SNF-level therapies and pt may be agreeable but pt reporting concern over "who will care for my two dogs?" Pt has a a friend, JAnderson Malta who may be helpful in this regard, mentioned this to her and she may be amenable. We will continue to follow her acutely. ?   ? ?Recommendations for follow up therapy are one component of a multi-disciplinary discharge planning process, led by the attending physician.  Recommendations may be updated based on patient status, additional  functional criteria and insurance authorization. ? ?Follow Up Recommendations Other (comment) (Pt has limited support at home but if could increase level of support may be candidate for HHPT; otherwise would benefit from SNF) ? ?  ?Assistance Recommended at Discharge Intermittent Supervision/Assistance  ?Patient can return home with the following ? A lot of help with walking and/or transfers;A little help with bathing/dressing/bathroom;Assistance with cooking/housework;Assist for transportation;Help with stairs or ramp for entrance ? ?  ?Equipment Recommendations None recommended by PT  ?Recommendations for Other Services ?    ?  ?Functional Status Assessment Patient has had a recent decline in their functional status and demonstrates the ability to make significant improvements in function in a reasonable and predictable amount of time.  ? ?  ?Precautions / Restrictions Precautions ?Precautions: Fall  ? ?  ? ?Mobility ? Bed Mobility ?Overal bed mobility: Needs Assistance ?Bed Mobility: Supine to Sit ?  ?  ?Supine to sit: Min assist ?  ?  ?General bed mobility comments: Pt min assist for scooting hips EOB. ?  ? ?Transfers ?Overall transfer level: Needs assistance ?Equipment used: Rolling walker (2 wheels) ?Transfers: Sit to/from Stand ?Sit to Stand: Min assist ?  ?  ?  ?  ?  ?General transfer comment: Pt min assist for steadying of RW, VCs for powering up through legs and BUE. ?  ? ?Ambulation/Gait ?Ambulation/Gait assistance: Min guard ?Gait Distance (Feet): 10 Feet ?Assistive device: Rolling walker (2 wheels) ?Gait Pattern/deviations: Step-to pattern, Narrow base of support, Trunk flexed, Drifts right/left ?Gait velocity: decreased ?  ?  ?General Gait Details: Pt ambulated 168fwith RW and min guard assist, no physical assist required. Pt demonstrated narrow BOS, genu valgum especiually of the RLE,  trunk flexed, with shortened step length. Pt took two standing rest breaks secondary to bilateral hip pain, responded  to general manual therapy to posterolateral hips, especially glute med area. no overt LOB. ? ?Stairs ?  ?  ?  ?  ?  ? ?Wheelchair Mobility ?  ? ?Modified Rankin (Stroke Patients Only) ?  ? ?  ? ?Balance Overall balance assessment: Needs assistance ?Sitting-balance support: Feet supported, No upper extremity supported ?Sitting balance-Leahy Scale: Good ?  ?  ?Standing balance support: Bilateral upper extremity supported, During functional activity, Reliant on assistive device for balance ?Standing balance-Leahy Scale: Poor ?  ?  ?  ?  ?  ?  ?  ?  ?  ?  ?  ?  ?   ? ? ? ?Pertinent Vitals/Pain Pain Assessment ?Pain Assessment: 0-10 ?Pain Score: 7  ?Pain Location: bilateral hips, back, stomach ?Pain Descriptors / Indicators: Crushing, Discomfort, Guarding, Grimacing, Moaning ?Pain Intervention(s): Limited activity within patient's tolerance, Monitored during session, Repositioned  ? ? ?Home Living Family/patient expects to be discharged to:: Private residence ?Living Arrangements: Alone ?Available Help at Discharge: Family;Available PRN/intermittently (Niece checks in every day, friend Anderson Malta is caring for her two dogs.) ?Type of Home: House ?Home Access: Stairs to enter ?Entrance Stairs-Rails: Left;Right;Can reach both ?Entrance Stairs-Number of Steps: 8 ?  ?Home Layout: One level ?Home Equipment: Kasandra Knudsen - single point;Shower seat;Rolling Walker (2 wheels);Grab bars - tub/shower ?   ?  ?Prior Function Prior Level of Function : Independent/Modified Independent ?  ?  ?  ?  ?  ?  ?Mobility Comments: uses RW and SPC occasionally, especially while in pain ?ADLs Comments: IND ?  ? ? ?Hand Dominance  ? Dominant Hand: Right ? ?  ?Extremity/Trunk Assessment  ? Upper Extremity Assessment ?Upper Extremity Assessment: Overall WFL for tasks assessed ?  ? ?Lower Extremity Assessment ?Lower Extremity Assessment: Generalized weakness;Overall [facility] for tasks assessed ?  ? ?Cervical / Trunk Assessment ?Cervical / Trunk Assessment: Back  Surgery  ?Communication  ? Communication: No difficulties  ?Cognition Arousal/Alertness: Awake/alert ?Behavior During Therapy: [facility] for tasks assessed/performed ?Overall Cognitive Status: Within Functional Limits for tasks assessed ?  ?  ?  ?  ?  ?  ?  ?  ?  ?  ?  ?  ?  ?  ?  ?  ?  ?  ?  ? ?  ?General Comments General comments (skin integrity, edema, etc.): Pt on 4LO2 via [place], SpO2 monitored throughout, ranged from 89% post-exercise to 99%, O2 sat responded to pursed lip breathing, though pt required VCs to continue to breathe in through her nose and out through her mouth ? ?  ?Exercises    ? ?Assessment/Plan  ?  ?PT Assessment Patient needs continued PT services  ?PT Problem List Decreased strength;Decreased range of motion;Decreased activity tolerance;Decreased balance;Decreased mobility;Decreased coordination;Decreased knowledge of use of DME;Cardiopulmonary status limiting activity;Pain ? ?   ?  ?PT Treatment Interventions DME instruction;Gait training;Stair training;Therapeutic activities;Therapeutic exercise;Functional mobility training;Balance training;Neuromuscular re-education;Patient/family education   ? ?PT Goals (Current goals can be found in the Care Plan section)  ?Acute Rehab PT Goals ?Patient Stated Goal: To reduce pain ?PT Goal Formulation: With patient ?Time For Goal Achievement: 10/05/21 ?Potential to Achieve Goals: Good ? ?  ?Frequency Min 3X/week ?  ? ? ?Co-evaluation   ?  ?  ?  ?  ? ? ?  ?AM-PAC PT "6 Clicks" Mobility  ?Outcome Measure Help needed turning from your back to your side  while in a flat bed without using bedrails?: None ?Help needed moving from lying on your back to sitting on the side of a flat bed without using bedrails?: A Little ?Help needed moving to and from a bed to a chair (including a wheelchair)?: A Little ?Help needed standing up from a chair using your arms (e.g., wheelchair or bedside chair)?: A Little ?Help needed to walk in hospital room?: A Little ?Help needed climbing  3-5 steps with a railing? : A Lot ?6 Click Score: 18 ? ?  ?End of Session Equipment Utilized During Treatment: Oxygen;Gait belt ?Activity Tolerance: Patient tolerated treatment well ?Patient left: in chair;with

## 2021-09-21 NOTE — Progress Notes (Addendum)
House Gastroenterology Progress Note ? ?[name] 76 y.o. 07-04-1945 ? ?CC: Melena, anemia ? ? ?Subjective: ?Patient examined sitting comfortably in chair.  She denies any bowel movement since procedure.  Notes her abdomen feels more firm.  Notes mild epigastric pain.  She used 1 packet of MiraLAX today.  Tolerating soft diet well. ? ?ROS : Review of Systems  ?Gastrointestinal:  Positive for abdominal pain and constipation. Negative for blood in stool, diarrhea, heartburn, melena, nausea and vomiting.  ?Genitourinary:  Negative for dysuria and urgency.  ?Neurological:  Negative for dizziness and headaches.  ? ? ? ?Objective: ?Vital signs in last 24 hours: ?Vitals:  ? 09/21/21 1012 09/21/21 1340  ?BP:  128/60  ?Pulse: 70 72  ?Resp:  16  ?Temp:  98.2 ?F (36.8 ?C)  ?SpO2:  93%  ? ? ?Physical Exam: ? ?General:  Alert, cooperative, no distress, appears stated age, pale  ?Head:  Normocephalic, without obvious abnormality, atraumatic  ?Eyes:  Anicteric sclera, EOM's intact, conjunctival pallor  ?Lungs:   Clear to auscultation bilaterally, respirations unlabored  ?Heart:  Regular rate and rhythm, S1, S2 normal  ?Abdomen:   Non-tender to palpation, firm, bowel sounds active all four quadrants,  no masses,   ?Extremities: Extremities normal, atraumatic, no  edema  ?Pulses: 2+ and symmetric  ? ? ?Lab Results: ?Recent Labs  ?  09/20/21 ?0611 09/21/21 ?6314  ?NA 143 142  ?K 4.2 4.6  ?CL 108 107  ?CO2 32 30  ?GLUCOSE 89 103*  ?BUN 58* 32*  ?CREATININE 1.15* 1.08*  ?CALCIUM 10.0 10.3  ? ?Recent Labs  ?  09/18/21 ?1913 09/19/21 ?9702  ?AST 28 23  ?ALT 19 15  ?ALKPHOS 64 52  ?BILITOT 0.7 0.4  ?PROT 6.3* 4.9*  ?ALBUMIN 3.7 2.8*  ? ?Recent Labs  ?  09/20/21 ?0611 09/21/21 ?6378  ?WBC 2.7* 2.7*  ?HGB 7.4* 7.2*  ?HCT 22.7* 22.4*  ?MCV 97.0 98.2  ?PLT 121* 127*  ? ?Recent Labs  ?  09/19/21 ?0605  ?LABPROT 14.8  ?INR 1.2  ? ? ? ? ?Assessment ?Anemia ?Fatigue ?Abdominal pain ?  ?Hemoglobin 7.2 this morning. Patient receiving 1 unit  packed red blood cells today for 2 total this admission. ?Baseline around 13. ?  ?Iron elevated at 229, 82% saturation ?Ferritin 71 ?  ?Noting constipation today.  Possible constipation due to oxycodone and decreased p.o. intake. ? ?  ?Patient on 4 L O2 via [place] at baseline due to COPD.   ?  ?CTAP with contrast ?1. No acute localizing process in the abdomen or pelvis. 2. Severe atherosclerotic calcifications. 3. Hypodensity in the dome of the liver is too small to characterize, possibly a cyst or hemangioma.  ?  ?EGD 09/20/2021 ?Nonbleeding gastric ulcer with no stigmata of bleeding ?Acute gastritis, negative for H. pylori ?Medium sized hiatal hernia ?Normal duodenum ? ?Plan: ?Continue supportive care ?Continue to monitor hemoglobin transfuse if less than 7. ?May consider MiraLAX twice daily if once daily does not improve bowel movements.  May also consider Fleet enema as if constipation worsens.  Continue docusate twice daily ?Continue Protonix 40 mg twice daily. ?Progress diet as tolerated. ?[facility] will sign off. ? ?Holly Rakes PA-C ?09/21/2021, 1:55 PM ? ?Contact #  [phone]  ?

## 2021-09-22 DIAGNOSIS — E538 Deficiency of other specified B group vitamins: Secondary | ICD-10-CM | POA: Diagnosis not present

## 2021-09-22 DIAGNOSIS — F419 Anxiety disorder, unspecified: Secondary | ICD-10-CM | POA: Diagnosis not present

## 2021-09-22 DIAGNOSIS — D519 Vitamin B12 deficiency anemia, unspecified: Secondary | ICD-10-CM | POA: Diagnosis not present

## 2021-09-22 DIAGNOSIS — D62 Acute posthemorrhagic anemia: Secondary | ICD-10-CM | POA: Diagnosis not present

## 2021-09-22 LAB — TYPE AND SCREEN
ABO/RH(D): O NEG
Antibody Screen: NEGATIVE
Unit division: 0
Unit division: 0

## 2021-09-22 LAB — BPAM RBC
Blood Product Expiration Date: 202306042359
Blood Product Expiration Date: 202306052359
ISSUE DATE / TIME: 202305011117
ISSUE DATE / TIME: 202305031328
Unit Type and Rh: 9500
Unit Type and Rh: 9500

## 2021-09-22 LAB — CBC
HCT: 27 % — ABNORMAL LOW (ref 36.0–46.0)
Hemoglobin: 8.6 g/dL — ABNORMAL LOW (ref 12.0–15.0)
MCH: 30.9 pg (ref 26.0–34.0)
MCHC: 31.9 g/dL (ref 30.0–36.0)
MCV: 97.1 fL (ref 80.0–100.0)
Platelets: 121 10*3/uL — ABNORMAL LOW (ref 150–400)
RBC: 2.78 MIL/uL — ABNORMAL LOW (ref 3.87–5.11)
RDW: 15 % (ref 11.5–15.5)
WBC: 3 10*3/uL — ABNORMAL LOW (ref 4.0–10.5)
nRBC: 0 % (ref 0.0–0.2)

## 2021-09-22 NOTE — Care Management Important Message (Signed)
Important Message ? ?Patient Details IM Letter given to the Patient. ?Name: [name] ?MRN: [id number] ?Date of Birth: [birth year] ? ? ?Medicare Important Message Given:  Yes ? ? ? ? ?Kerin Salen ?09/22/2021, 10:35 AM ?

## 2021-09-22 NOTE — TOC Initial Note (Addendum)
Transition of Care (TOC) - Initial/Assessment Note  ? ? ?Patient Details  ?Name: [name] ?MRN: [id number] ?Date of Birth: [birth year] ? ?Transition of Care (TOC) CM/SW Contact:    ?Nathalie Cavendish, Marjie Skiff, RN ?Phone Number: ?09/22/2021, 1:44 PM ? ?Clinical Narrative:                 ?Spoke with pt for dc planning. Physical therapy recommendations reviewed with pt. She states she is "absolutely not going to rehab". She states she will go back home at dc. She did agree to allowing HHPT to come and work with her at home. Choice offered for HHPT and [facility] chosen. [facility] liaison sent referral. Will need HHPT orders. Pt has cane and RW at home. She has home 02 with [employer]. ? ?Daughter has called wanting to speak about pt. When I asked pt if it was ok if I speak with her daughter she said that we are not to talk to her. RN and charge RN made aware.  ? ?Expected Discharge Plan: [place] ?Barriers to Discharge: Continued Medical Work up ? ? ?Patient Goals and CMS Choice ?Patient states their goals for this hospitalization and ongoing recovery are:: To go home ?CMS Medicare.gov Compare Post Acute Care list provided to:: Patient ?Choice offered to / list presented to : Patient ? ?Expected Discharge Plan and Services ?Expected Discharge Plan: [place] ?  ?Discharge Planning Services: CM Consult ?Post Acute Care Choice: Home Health ?Living arrangements for the past 2 months: [place] ?                ?  ?  ?  ?  ?  ?HH Arranged: PT ?[place] Agency: [place] ?Date HH Agency Contacted: 09/22/21 ?Time [place]: [phone] ?Representative spoke with at [place]: Tommi Rumps ? ?Prior Living Arrangements/Services ?Living arrangements for the past 2 months: [place] ?Lives with:: Self ?Patient language and need for interpreter reviewed:: Yes ?Do you feel safe going back to the place where you live?: Yes      ?Need for Family Participation in Patient Care: Yes  (Comment) ?Care giver support system in place?: Yes (comment) ?Current home services: DME ([facility] for 02) ?Criminal Activity/Legal Involvement Pertinent to Current Situation/Hospitalization: No - Comment as needed ? ?Activities of Daily Living ?Home Assistive Devices/Equipment: Cane (specify quad or straight), Walker (specify type) ?ADL Screening (condition at time of admission) ?Patient's cognitive ability adequate to safely complete daily activities?: Yes ?Is the patient deaf or have difficulty hearing?: No ?Does the patient have difficulty seeing, even when wearing glasses/contacts?: No ?Does the patient have difficulty concentrating, remembering, or making decisions?: No ?Patient able to express need for assistance with ADLs?: Yes ?Does the patient have difficulty dressing or bathing?: No ?Independently performs ADLs?: Yes (appropriate for developmental age) ?Does the patient have difficulty walking or climbing stairs?: Yes ?Weakness of Legs: Both ?Weakness of Arms/Hands: None ? ?Permission Sought/Granted ?Permission sought to share information with : Customer service manager ?Permission granted to share information with : Yes, Verbal Permission Granted ?   ? Permission granted to share info w AGENCY: [facility] ?   ?   ? ?Emotional Assessment ?Appearance:: Appears stated age ?Attitude/Demeanor/Rapport: Gracious ?Affect (typically observed): Calm ?Orientation: : Oriented to Place, Oriented to  Time, Oriented to Self, Oriented to Situation ?Alcohol / Substance Use: Not Applicable ?Psych Involvement: No (comment) ? ?Admission diagnosis:  Anemia [D64.9] ?GI bleed [K92.2] ?Patient Active Problem List  ?  Diagnosis Date Noted  ? Dysphagia 09/20/2021  ? Restless leg syndrome 09/20/2021  ? B12 deficiency 09/19/2021  ? Chronic respiratory failure with hypoxia ([place]) 09/18/2021  ? COPD with acute exacerbation ([place]) 11/08/2020  ? Elevated troponin 11/08/2020  ? Chronic kidney disease, stage 3b ([place]) 11/08/2020  ? Shock  ([place]) 06/22/2019  ? Shock circulatory ([place]) 06/21/2019  ? Ruptured varicose vein 06/21/2019  ? COPD (chronic obstructive pulmonary disease) ([place]) 06/21/2019  ? Chronic diastolic CHF (congestive heart failure) ([place]) 06/21/2019  ? SOB (shortness of breath) 07/15/2018  ? Laboratory examination 07/15/2018  ? PAD (peripheral artery disease) ([place]) 07/15/2018  ? Centrilobular emphysema ([place]) 07/15/2018  ? Hx of varicose veins of lower extremity 07/15/2018  ? Back pain 01/21/2016  ? Sacroiliac joint dysfunction of left side 12/27/2015  ? History of colon cancer 09/21/2015  ? Iron deficiency anemia 06/04/2015  ? Syncope 11/05/2014  ? Narcotic addiction ([place])   ? Chronic back pain 10/27/2014  ? Faintness   ? Frequent falls 08/22/2014  ? Facial contusion 08/22/2014  ? Subdural hematoma ([place]) 08/21/2014  ? Acute kidney injury superimposed on CKD ([place]) 01/20/2013  ? Acute mastitis of right breast 04/05/2012  ? Right shoulder pain 03/27/2012  ? Breast erythema most likely secondary to mastitis / cellulitis 03/24/2012  ? Hyponatremia 03/24/2012  ? Cancer of colon ([place]) 05/24/2011  ? Cancer of right breast, stage 1 ([place]) 05/24/2011  ? Colonic mass 05/03/2011  ? H/O tobacco use, presenting hazards to health 05/03/2011  ? Weakness generalized 05/01/2011  ? Acute blood loss anemia 05/01/2011  ? Falls frequently 05/01/2011  ? GI bleed 05/01/2011  ? Hypokalemia 05/01/2011  ? HTN (hypertension) 05/01/2011  ? Anxiety and depression 05/01/2011  ? Lower extremity edema 05/01/2011  ? ?PCP:  Scifres, Dorothy, PA-C (Inactive) ?Pharmacy:   ?[email or web address]?Phone: [phone]: [phone]? ? ? ? ?Social Determinants of Health (SDOH) Interventions ?  ? ?Readmission Risk Interventions ? ?  09/22/2021  ?  1:38 PM  ?Readmission Risk Prevention Plan  ?Transportation Screening Complete  ?Medication Review (Press photographer Complete  ?[facility]  Consult Complete  ?SW Recovery Care/Counseling Consult Complete  ?Palliative Care Screening Not Applicable  ?[place]  ? ? ? ?

## 2021-09-22 NOTE — Progress Notes (Signed)
?      ? ? ? [facility] ?                                                                           ? ? ?[name], is a 76 y.o. female, DOB - 09-20-1945, HER:[id number] ?Admit date - 09/18/2021    ?Outpatient Primary MD for the patient is [name] (Inactive) ? ?LOS - 3  days ? ?Chief Complaint  ?Patient presents with  ? Nausea  ?    ? ?Brief summary  ? ?[name] is a 76 y.o. female with medical history significant for COPD, chronic respiratory failure with hypoxia on 3-4 L O2 via [place], CKD stage IIIb, PAD, HTN, colon cancer s/p right hemicolectomy, iron deficiency anemia, chronic back pain, depression/anxiety who is admitted with anemia suspected due to upper GI bleeding. ? ? ?Assessment & Plan  ? ? ?Principal Problem: ?  Acute blood loss anemia ?-Hemoglobin 6.8 on admission, 9.8 on 4/30, 14.0 on 11/09/2020.  FOBT positive ?-GI was consulted ?-Underwent EGD on 5/2, showed normal esophagus, nonbleeding gastric ulcer, with no stigmata of bleeding, acute gastritis, medium size hiatal hernia, normal duodenum ?-Received transfusion on 5/3, hemoglobin 8.6 today.  Tolerating diet  ?-Per GI, conservative management, no plans of colonoscopy ?-Placed on bowel regimen ? ?COPD (chronic obstructive pulmonary disease) ([place]), chronic respiratory failure with hypoxia ?- Stable without wheezing on admission.  ?-Continue home O2 4 L, DuoNebs as needed  ? ?  ?Chronic kidney disease, stage 3b ([place]) ?-Stable, creatinine 1.2 on 5/1, improving to 1.0  ? ?HTN (hypertension) ?-Continue to hold antihypertensives  ?  ?PAD (peripheral artery disease) ([place]) ?-Continue statin  ?-Hold aspirin ? ?Restless leg syndrome ?-Continue Requip ?  ?B12 deficiency ?Noted on anemia panel, B12 163, folate normal 13.4 ?-Continue B12 replacement ?  ?Chronic back pain ?She is on large doses of gabapentin and OxyContin at home. ?-continue home regimen ?  ?Anxiety and depression ?Continue home bupropion, mirtazapine, Xanax as needed  with hold parameters. ?  ? ?Code Status: DNR ?DVT Prophylaxis:  SCDs Start: 09/18/21 2257 ? ? ?Level of Care: Level of care: Med-Surg ?Family Communication: Updated patient.  Patient declined communication to the daughter. ? ? ?Disposition Plan:      Remains inpatient appropriate:  ? ?Discussed in detail with the patient, she wants to go home and appreciates having home health, RN, aide.  She declined skilled nursing facility wants to go home in a.m.  ? ?Procedures:  ?EGD ? ?Consultants:   ?Gastroenterology ? ?Antimicrobials:  ?None ? ? ? ?Medications ? atorvastatin  10 mg Oral Daily  ? buPROPion  150 mg Oral Daily  ? docusate sodium  100 mg Oral BID  ? gabapentin  300 mg Oral TID  ? ipratropium-albuterol  3 mL Nebulization BID  ? mirtazapine  30 mg Oral QHS  ? oxyCODONE  60 mg Oral Q8H  ? pantoprazole (PROTONIX) IV  40 mg Intravenous Q12H  ? polyethylene glycol  17 g Oral Daily  ? rOPINIRole  1 mg Oral QHS  ? vitamin B-12  1,000 mcg Oral Daily  ? ? ? ? ?Subjective:  ? ?[name] was seen and examined today.  No acute complaints today except feeling weak and not ready for discharge.  No bleeding, no nausea vomiting or hematemesis.  Tolerating diet.   ? ?Objective:  ? ?Vitals:  ? 09/21/21 1654 09/21/21 2205 09/22/21 0459 09/22/21 1354  ?BP: 125/66 123/62 132/77 (!) 125/58  ?Pulse: 65 64 (!) 58 (!) 52  ?Resp: '18 14 16 18  '$ ?Temp: 98.2 ?F (36.8 ?C) 97.6 ?F (36.4 ?C) 97.7 ?F (36.5 ?C) (!) 97.4 ?F (36.3 ?C)  ?TempSrc: Oral Oral Oral Oral  ?SpO2: 100% 100% 100% 96%  ?Weight:   61.1 kg   ?Height:      ? ? ?Intake/Output Summary (Last 24 hours) at 09/22/2021 1544 ?Last data filed at 09/22/2021 1314 ?Gross per 24 hour  ?Intake 614 ml  ?Output --  ?Net 614 ml  ? ? ? ?Wt Readings from Last 3 Encounters:  ?09/22/21 61.1 kg  ?06/21/21 59.9 kg  ?06/10/21 56.2 kg  ? ?Physical Exam ?General: Alert and oriented x 3, NAD ?Cardiovascular: S1 S2 clear, RRR. No pedal edema b/l ?Respiratory: CTAB ?Gastrointestinal: Soft, nontender,  nondistended, NBS ?Ext: no pedal edema bilaterally ?Psych: Normal affect and demeanor, alert and oriented x3  ? ? ? ?Data Reviewed:  I have personally reviewed following labs  ? ? ?CBC ?Lab Results  ?Component Value Date  ? WBC 3.0 (L) 09/22/2021  ? RBC 2.78 (L) 09/22/2021  ? HGB 8.6 (L) 09/22/2021  ? HCT 27.0 (L) 09/22/2021  ? MCV 97.1 09/22/2021  ? MCH 30.9 09/22/2021  ? PLT 121 (L) 09/22/2021  ? MCHC 31.9 09/22/2021  ? RDW 15.0 09/22/2021  ? LYMPHSABS 0.4 (L) 11/09/2020  ? MONOABS 0.2 11/09/2020  ? EOSABS 0.0 11/09/2020  ? BASOSABS 0.0 11/09/2020  ? ? ? ?Last metabolic panel ?Lab Results  ?Component Value Date  ? NA 142 09/21/2021  ? K 4.6 09/21/2021  ? CL 107 09/21/2021  ? CO2 30 09/21/2021  ? BUN 32 (H) 09/21/2021  ? CREATININE 1.08 (H) 09/21/2021  ? GLUCOSE 103 (H) 09/21/2021  ? GFRNONAA 54 (L) 09/21/2021  ? GFRAA 40 (L) 11/15/2019  ? CALCIUM 10.3 09/21/2021  ? PHOS 3.4 11/09/2020  ? PROT 4.9 (L) 09/19/2021  ? ALBUMIN 2.8 (L) 09/19/2021  ? LABGLOB 1.8 06/12/2019  ? AGRATIO 2.4 (H) 06/12/2019  ? BILITOT 0.4 09/19/2021  ? ALKPHOS 52 09/19/2021  ? AST 23 09/19/2021  ? ALT 15 09/19/2021  ? ANIONGAP 5 09/21/2021  ? ? ?CBG (last 3)  ?Recent Labs  ?  09/19/21 ?1706  ?GLUCAP 111*  ?  ? ? ?Coagulation Profile: ?Recent Labs  ?Lab 09/19/21 ?0605  ?INR 1.2  ? ? ? ? ?[name] M.D. ?[facility] ?09/22/2021, 3:44 PM ? ?Available via Epic secure chat 7am-7pm ?After 7 pm, please refer to night coverage provider listed on amion. ? ? ? ?

## 2021-09-23 DIAGNOSIS — D519 Vitamin B12 deficiency anemia, unspecified: Secondary | ICD-10-CM | POA: Diagnosis not present

## 2021-09-23 DIAGNOSIS — D62 Acute posthemorrhagic anemia: Secondary | ICD-10-CM | POA: Diagnosis not present

## 2021-09-23 DIAGNOSIS — E538 Deficiency of other specified B group vitamins: Secondary | ICD-10-CM | POA: Diagnosis not present

## 2021-09-23 DIAGNOSIS — F419 Anxiety disorder, unspecified: Secondary | ICD-10-CM | POA: Diagnosis not present

## 2021-09-23 MED ORDER — ALBUTEROL SULFATE HFA 108 (90 BASE) MCG/ACT IN AERS: 2.0000 | INHALATION_SPRAY | Freq: Four times a day (QID) | RESPIRATORY_TRACT | 2 refills | Status: DC | PRN

## 2021-09-23 MED ORDER — PROCHLORPERAZINE MALEATE 10 MG PO TABS: 10.0000 mg | ORAL_TABLET | Freq: Four times a day (QID) | ORAL | 0 refills | Status: DC | PRN

## 2021-09-23 MED ORDER — DOCUSATE SODIUM 100 MG PO CAPS: 100.0000 mg | ORAL_CAPSULE | Freq: Two times a day (BID) | ORAL | 1 refills | Status: DC

## 2021-09-23 MED ORDER — CYANOCOBALAMIN 1000 MCG PO TABS
1000.0000 ug | ORAL_TABLET | Freq: Every day | ORAL | 3 refills | Status: DC
Start: 2021-09-24 — End: 2021-11-09

## 2021-09-23 MED ORDER — PANTOPRAZOLE SODIUM 40 MG PO TBEC: 40.0000 mg | DELAYED_RELEASE_TABLET | Freq: Two times a day (BID) | ORAL | 1 refills | Status: DC

## 2021-09-23 MED ORDER — GABAPENTIN 300 MG PO CAPS: 300.0000 mg | ORAL_CAPSULE | Freq: Three times a day (TID) | ORAL | 1 refills | Status: DC

## 2021-09-23 NOTE — Discharge Summary (Signed)
?Physician Discharge Summary ?  ?Patient: [name] MRN: [id number] DOB: 11-Feb-1946  ?Admit date:     09/18/2021  ?Discharge date: 09/23/21  ?Discharge Physician: Maekayla Giorgio  ? ?PCP: Scifres, Dorothy, PA-C (Inactive)  ? ?Recommendations at discharge:  ? ?Continue Protonix 40 mg twice daily.  Outpatient follow-up with GI ?Holding losartan, HCTZ. ? ?Discharge Diagnoses: ? ?  Acute blood loss anemia, upper GI bleed ?  COPD (chronic obstructive pulmonary disease) ([place]) ?  Chronic respiratory failure with hypoxia (HCC) ?  Chronic kidney disease, stage 3b ([place]) ?  HTN (hypertension) ?  PAD (peripheral artery disease) ([place]) ?  GI bleed ?  Anxiety and depression ?  Chronic back pain ?  B12 deficiency ?  Dysphagia ?  Restless leg syndrome ? ?Hospital Course: ?Holly S Gallacher is a 76 y.o. female with medical history significant for COPD, chronic respiratory failure with hypoxia on 3-4 L O2 via [place], CKD stage IIIb, PAD, HTN, colon cancer s/p right hemicolectomy, iron deficiency anemia, chronic back pain, depression/anxiety who is admitted with anemia suspected due to upper GI bleeding. ? ?Assessment and Plan: ?*  ?Acute blood loss anemia ?-Hemoglobin 6.8 on admission, 9.8 on 4/30, 14.0 on 11/09/2020.  FOBT positive ?-Underwent EGD on 5/2, showed normal esophagus, nonbleeding gastric ulcer, with no stigmata of bleeding, acute gastritis, medium size hiatal hernia, normal duodenum ?-Received transfusion on 5/3 ?-Per GI, conservative management, no plans of colonoscopy ?-Placed on bowel regimen, hemoglobin 8.6.  GI has signed off, cleared to be discharged home.  Tolerating diet ?  ?COPD (chronic obstructive pulmonary disease) ([place]), chronic respiratory failure with hypoxia ?- Stable without wheezing on admission.  ?-Continue home O2 4 L, DuoNebs as needed  ?  ?  ?Chronic kidney disease, stage 3b ([place]) ?-Stable, creatinine 1.2 on 5/1, improving to 1.0  ?  ?HTN (hypertension) ?-BP now stable, continue to hold  losartan/HCTZ. ?  ?PAD (peripheral artery disease) ([place]) ?-Continue low-dose aspirin 81 mg daily, statin ?  ?Restless leg syndrome ?-Continue Requip ?  ?B12 deficiency ?Noted on anemia panel, B12 163, folate normal 13.4 ?-Continue B12 replacement ?  ?Chronic back pain ?She is on large doses of gabapentin and OxyContin at home. ?-continue home regimen ?  ?Anxiety and depression ?Continue home bupropion, mirtazapine, ?  ? ?Pain control - [employer] Controlled Substance Reporting System database was reviewed. and patient was instructed, not to drive, operate heavy machinery, perform activities at heights, swimming or participation in water activities or provide baby-sitting services while on Pain, Sleep and Anxiety Medications; until their outpatient Physician has advised to do so again. Also recommended to not to take more than prescribed Pain, Sleep and Anxiety Medications.  ?Consultants: Gastroenterology ?Procedures performed: EGD ?Disposition: Home ?Diet recommendation:  ?Discharge Diet Orders (From admission, onward)  ? ?  Start     Ordered  ? 09/23/21 0000  Diet - low sodium heart healthy       ? 09/23/21 1211  ? ?  ?  ? ?  ? ? ?DISCHARGE MEDICATION: ?Allergies as of 09/23/2021   ?No Known Allergies ?  ? ?  ?Medication List  ?  ? ?STOP taking these medications   ? ?guaiFENesin 600 MG 12 hr tablet ?Commonly known as: [place] ?  ?losartan-hydrochlorothiazide 100-12.5 MG tablet ?Commonly known as: HYZAAR ?  ? ?  ? ?TAKE these medications   ? ?acetaminophen 325 MG tablet ?Commonly known as: TYLENOL ?Take 2 tablets (650 mg total) by mouth every 6 (six) hours as  needed for mild pain (or Fever >/= 101). ?  ?albuterol 108 (90 Base) MCG/ACT inhaler ?Commonly known as: VENTOLIN HFA ?Inhale 2 puffs into the lungs every 6 (six) hours as needed for wheezing or shortness of breath. ?  ?ALPRAZolam 1 MG tablet ?Commonly known as: Duanne Moron ?Take 1 tablet (1 mg total) by mouth 2 (two) times daily as needed. ?What changed:  ?when to  take this ?reasons to take this ?  ?amLODipine 5 MG tablet ?Commonly known as: NORVASC ?Take 1 tablet (5 mg total) by mouth daily. ?  ?aspirin EC 81 MG tablet ?Take 81 mg by mouth daily. Swallow whole. ?  ?atorvastatin 10 MG tablet ?Commonly known as: LIPITOR ?TAKE 1 TABLET BY MOUTH EVERY DAY ?  ?buPROPion 150 MG 24 hr tablet ?Commonly known as: WELLBUTRIN XL ?Take 150 mg by mouth daily. ?  ?cyanocobalamin 1000 MCG tablet ?Take 1 tablet (1,000 mcg total) by mouth daily. ?Start taking on: Sep 24, 2021 ?  ?docusate sodium 100 MG capsule ?Commonly known as: COLACE ?Take 1 capsule (100 mg total) by mouth 2 (two) times daily. For constipation ?  ?furosemide 20 MG tablet ?Commonly known as: LASIX ?Take 1 tablet (20 mg total) by mouth daily. ?  ?gabapentin 300 MG capsule ?Commonly known as: NEURONTIN ?Take 1 capsule (300 mg total) by mouth 3 (three) times daily. ?  ?mirtazapine 30 MG tablet ?Commonly known as: REMERON ?Take 30 mg by mouth at bedtime. ?  ?Multi For Her 50+ Tabs ?Take 1 tablet by mouth daily. ?  ?oxyCODONE 60 MG 12 hr tablet ?Commonly known as: OXYCONTIN ?Take 1 tablet by mouth every 8 (eight) hours. ?  ?pantoprazole 40 MG tablet ?Commonly known as: Protonix ?Take 1 tablet (40 mg total) by mouth 2 (two) times daily before a meal. ?  ?potassium chloride 10 MEQ tablet ?Commonly known as: KLOR-CON ?TAKE 1 TABLET (10 MEQ TOTAL) BY MOUTH DAILY. TAKE WITH LASIX ?  ?prochlorperazine 10 MG tablet ?Commonly known as: COMPAZINE ?Take 1 tablet (10 mg total) by mouth every 6 (six) hours as needed for nausea or vomiting. ?  ?rOPINIRole 1 MG tablet ?Commonly known as: REQUIP ?Take 1 mg by mouth at bedtime. ?  ? ?  ? ? Follow-up Information   ? ? Care, [facility] Follow up.   ?Specialty: Home Health Services ?Why: Home physical therapy ?Contact information: ?[address] [place] ?[address] ?[place] [address] ?[phone] ? ? ?  ?  ? ? Scifres, Dorothy, [employer]. Schedule an appointment as soon as possible for a visit in 2  week(s).   ?Specialty: Physician Assistant ?Why: for hospital follow-up ?Contact information: ?[address] ?[address] ?[place] [address] ?[phone] ? ? ?  ?  ? ?  ?  ? ?  ? ?Discharge Exam: ?Filed Weights  ? 09/20/21 1126 09/22/21 0459 09/23/21 0540  ?Weight: 56.6 kg 61.1 kg 61 kg  ? ?S: Multiple chronic complaints including back pain otherwise no acute issues.  Declined skilled nursing facility, states she wants to go home.  Her friend and her niece has been helping her. ? ?Vitals:  ? 09/22/21 0459 09/22/21 1354 09/22/21 2043 09/23/21 0540  ?BP: 132/77 (!) 125/58 (!) 125/55 (!) 167/87  ?Pulse: (!) 58 (!) 52 65 77  ?Resp: '16 18 18 14  '$ ?Temp: 97.7 ?F (36.5 ?C) (!) 97.4 ?F (36.3 ?C) 98.1 ?F (36.7 ?C) 97.6 ?F (36.4 ?C)  ?TempSrc: Oral Oral Oral Oral  ?SpO2: 100% 96% 96% 100%  ?Weight: 61.1  kg   61 kg  ?Height:      ?  ? ?Physical Exam ?General: Alert and oriented x 3, NAD ?Cardiovascular: S1 S2 clear, RRR. No pedal edema b/l ?Respiratory: CTAB, no wheezing, rales or rhonchi ?Gastrointestinal: Soft, nontender, nondistended, NBS ?Ext: no pedal edema bilaterally ?Neuro: no new deficits ?Psych: Normal affect and demeanor, alert and oriented x3  ? ? ?Condition at discharge: fair ? ?The results of significant diagnostics from this hospitalization (including imaging, microbiology, ancillary and laboratory) are listed below for reference.  ? ?Imaging Studies: ?CT ABDOMEN PELVIS W CONTRAST ? ?Result Date: 09/18/2021 ?CLINICAL DATA:  Acute abdominal pain. EXAM: CT ABDOMEN AND PELVIS WITH CONTRAST TECHNIQUE: Multidetector CT imaging of the abdomen and pelvis was performed using the standard protocol following bolus administration of intravenous contrast. RADIATION DOSE REDUCTION: This exam was performed according to the departmental dose-optimization program which includes automated exposure control, adjustment of the mA and/or kV according to patient size and/or use of iterative reconstruction technique. CONTRAST:  60m  OMNIPAQUE IOHEXOL 300 MG/ML  SOLN COMPARISON:  CT chest abdomen and pelvis 04/14/2015. FINDINGS: Lower chest: No acute abnormality. Hepatobiliary: Hypodense area in the dome of the liver is too small to characteri

## 2021-09-28 ENCOUNTER — Telehealth: Payer: Self-pay | Admitting: Internal Medicine

## 2021-10-04 DIAGNOSIS — J439 Emphysema, unspecified: Secondary | ICD-10-CM | POA: Diagnosis not present

## 2021-10-04 DIAGNOSIS — D62 Acute posthemorrhagic anemia: Secondary | ICD-10-CM | POA: Diagnosis not present

## 2021-10-04 DIAGNOSIS — E538 Deficiency of other specified B group vitamins: Secondary | ICD-10-CM | POA: Diagnosis not present

## 2021-10-04 DIAGNOSIS — N184 Chronic kidney disease, stage 4 (severe): Secondary | ICD-10-CM | POA: Diagnosis not present

## 2021-10-04 DIAGNOSIS — I10 Essential (primary) hypertension: Secondary | ICD-10-CM | POA: Diagnosis not present

## 2021-10-04 DIAGNOSIS — Z8719 Personal history of other diseases of the digestive system: Secondary | ICD-10-CM | POA: Diagnosis not present

## 2021-10-05 DIAGNOSIS — M47816 Spondylosis without myelopathy or radiculopathy, lumbar region: Secondary | ICD-10-CM | POA: Diagnosis not present

## 2021-10-05 DIAGNOSIS — Z79891 Long term (current) use of opiate analgesic: Secondary | ICD-10-CM | POA: Diagnosis not present

## 2021-10-05 DIAGNOSIS — M961 Postlaminectomy syndrome, not elsewhere classified: Secondary | ICD-10-CM | POA: Diagnosis not present

## 2021-10-05 DIAGNOSIS — G894 Chronic pain syndrome: Secondary | ICD-10-CM | POA: Diagnosis not present

## 2021-10-06 ENCOUNTER — Ambulatory Visit: Payer: Medicare Other | Admitting: Cardiology

## 2021-10-06 ENCOUNTER — Encounter: Payer: Self-pay | Admitting: Cardiology

## 2021-10-06 VITALS — BP 130/80 | HR 79 | Temp 98.9°F | Resp 16 | Ht 60.0 in | Wt 120.8 lb

## 2021-10-06 DIAGNOSIS — J432 Centrilobular emphysema: Secondary | ICD-10-CM

## 2021-10-06 DIAGNOSIS — N1832 Chronic kidney disease, stage 3b: Secondary | ICD-10-CM

## 2021-10-06 DIAGNOSIS — I10 Essential (primary) hypertension: Secondary | ICD-10-CM

## 2021-10-06 DIAGNOSIS — I739 Peripheral vascular disease, unspecified: Secondary | ICD-10-CM

## 2021-10-06 DIAGNOSIS — R0609 Other forms of dyspnea: Secondary | ICD-10-CM | POA: Diagnosis not present

## 2021-10-06 DIAGNOSIS — R600 Localized edema: Secondary | ICD-10-CM | POA: Diagnosis not present

## 2021-10-06 NOTE — Progress Notes (Signed)
Primary Physician/Referring:  Almedia Balls, NP  Patient ID: [name], female    DOB: Jan 29, 1946, 76 y.o.   MRN: [id number]  Chief complaints: hospital follow up  HPI: [name]  is a 76 y.o. female  with peripheral artery disease has known long left SFA CTO with failed attempt in September 2020 for revascularization, also has right calcific iliac artery stenosis which was felt to be high risk due to extension into right common femoral artery for dissection. She has right hip claudication and also left leg claudication that is chronic but has remained stable.  Past medical history significant for tobacco use disorder, which she quit on 10/21/2019, hypertension, COPD,Stage III chronic kidney disease, history of breast cancer, bilateral venous insufficiency and has had ablation and persistent superficial varicose veins with complications and recurrent varicose vein bleeding.  She presented with severe anemia on 09/18/2021 related to upper GI bleed needing blood transfusion, EGD revealing nonbleeding gastric ulcer, acute gastritis and medium-sized hiatal hernia.  She was also treated for chronic hypoxemic respiratory failure secondary to underlying COPD.  She now presents for posthospital follow-up. Her main reason for follow-up was worsening leg edema that started prior to hospitalization.  Suspect melena, gradual heme loss leading to potential chronic diastolic heart failure and worsening leg edema.  Patient started developing worsening leg edema about 3 weeks ago.  On further questioning, she had noted melanotic stools on and off for the past 3 to 4 weeks prior to presenting to the emergency room with marked abdominal distention and abdominal discomfort and fatigue.  She is now feeling much better, she has not had any further melena and denies any recent use of NSAIDs.   Past Medical History:  Diagnosis Date   Acute renal failure superimposed on stage 3 chronic kidney disease ([place])  10/27/2014   Back pain, chronic    Breast cancer ([place]) 09/09/2010   Cancer of colon ([place]) 05/24/2011   Centrilobular emphysema ([place]) 07/15/2018   CKD (chronic kidney disease), stage III (HCC)    Claudication, intermittent ([place]) 07/15/2018   Depression    GIB (gastrointestinal bleeding)    H/O tobacco use, presenting hazards to health Quit Dec 2019 05/03/2011   50 years, up to 3PPD quit last year.    Hx of varicose veins of lower extremity 07/15/2018   Hyperlipidemia    Hypertension    SDH (subdural hematoma) (HCC)    SOB (shortness of breath) 07/15/2018    Social History   Tobacco Use   Smoking status: Former    Packs/day: 0.25    Years: 30.00    Pack years: 7.50    Types: Cigarettes    Quit date: 10/31/2019    Years since quitting: 1.9   Smokeless tobacco: Never   Tobacco comments:    had 6 cigarettes recently 03-10-20  Substance Use Topics   Alcohol use: Yes    Comment: rarely   Marital Status: Divorced   Review of Systems  Constitutional: Negative for malaise/fatigue.  Cardiovascular:  Positive for claudication (very mild) and dyspnea on exertion. Negative for chest pain, leg swelling and orthopnea.  Respiratory:  Positive for cough.   Objective  Blood pressure 130/80, pulse 79, temperature 98.9 F (37.2 C), temperature source Temporal, resp. rate 16, height 5' (1.524 m), weight 120 lb 12.8 oz (54.8 kg). Body mass index is 23.59 kg/m.     10/06/2021    2:35 PM 09/23/2021    5:40 AM 09/22/2021    8:43 PM  Vitals with BMI  Height '5\' 0"'     Weight 120 lbs 13 oz 134 lbs 8 oz   BMI 16.10 96.04   Systolic 540 981 191  Diastolic 80 87 55  Pulse 79 77 65     Physical Exam Vitals reviewed.  Constitutional:      Appearance: She is well-developed.  Neck:     Vascular: No carotid bruit or JVD.  Cardiovascular:     Rate and Rhythm: Normal rate and regular rhythm. Occasional Extrasystoles are present.    Pulses:          Femoral pulses are 2+ on the right side with  bruit.      Popliteal pulses are 1+ on the right side and 1+ on the left side.       Dorsalis pedis pulses are 1+ on the right side and 0 on the left side.       Posterior tibial pulses are 0 on the left side.     Heart sounds: Normal heart sounds.  Pulmonary:     Effort: Pulmonary effort is normal. No accessory muscle usage or respiratory distress.     Breath sounds: Rales (Occasional scattered) present. No wheezing.  Abdominal:     General: Bowel sounds are normal.     Palpations: Abdomen is soft.  Musculoskeletal:     Right lower leg: Edema (2-3+ bilateral below-knee pitting edema) present.     Left lower leg: Edema (2-3+ below-knee pitting edema) present.  Skin:    Capillary Refill: Capillary refill takes less than 2 seconds.     Findings: No erythema.   Radiology: No results found.  Laboratory examination:       Latest Ref Rng & Units 09/21/2021    5:29 AM 09/20/2021    6:11 AM 09/19/2021    6:05 AM  CMP  Glucose 70 - 99 mg/dL 103   89   99    BUN 8 - 23 mg/dL 32   58   94    Creatinine 0.44 - 1.00 mg/dL 1.08   1.15   1.26    Sodium 135 - 145 mmol/L 142   143   141    Potassium 3.5 - 5.1 mmol/L 4.6   4.2   3.9    Chloride 98 - 111 mmol/L 107   108   107    CO2 22 - 32 mmol/L 30   32   31    Calcium 8.9 - 10.3 mg/dL 10.3   10.0   10.0    Total Protein 6.5 - 8.1 g/dL   4.9    Total Bilirubin 0.3 - 1.2 mg/dL   0.4    Alkaline Phos 38 - 126 U/L   52    AST 15 - 41 U/L   23    ALT 0 - 44 U/L   15        Latest Ref Rng & Units 09/22/2021    5:29 AM 09/21/2021    6:21 PM 09/21/2021    5:29 AM  CBC  WBC 4.0 - 10.5 K/uL 3.0    2.7    Hemoglobin 12.0 - 15.0 g/dL 8.6   9.1   7.2    Hematocrit 36.0 - 46.0 % 27.0   28.6   22.4    Platelets 150 - 400 K/uL 121    127     Lipid Panel     Component Value Date/Time   CHOL 132 06/12/2019 1502   TRIG  62 06/12/2019 1502   HDL 73 06/12/2019 1502   CHOLHDL 1.8 06/12/2019 1502   LDLCALC 46 06/12/2019 1502   HEMOGLOBIN A1C Lab  Results  Component Value Date   HGBA1C 6.1 (H) 10/28/2014   MPG 128 10/28/2014   TSH No results for input(s): TSH in the last 8760 hours.  External labs:  Labs 10/04/2021:  Hb 9.7/HCT 29.6, platelets 245, normal indicis.  BUN 28, creatinine 1.61, EGFR 33 mL, potassium 4.2.  LFTs normal.  Labs 03/03/2021:  Hb 12.3/HCT 36.7, platelets 216.  Normal indicis.  Serum glucose 82 mg, BUN 28, creatinine 1.33, EGFR 42 mL, potassium 4.1, LFTs normal.  Magnesium 2.0.  Lipid profile 07/28/2020:  Total cholesterol 149, triglycerides 74, HDL 78, LDL 57.   Cardiac Studies:   Lower Extremity Arterial Duplex 01/14/2019: There is monophasic waveform noted in the right external iliac and CFA suggests proximal significant disease. There is severe diffuse mixed plaque noted throughout the right lower extremity.  Moderate velocity increase at the left distal superficial femoral artery, >50% stenosis. There is severe diffuse mixed plaque throughout the left lower extremity This exam reveals moderately decreased perfusion of the right lower extremity, noted at the dorsalis pedis artery level (ABI 0.75) and moderately decreased perfusion of the left lower extremity, noted at the post tibial artery level (ABI 0.58).  No significant change from report of 01/11/2015.   Peripheral arteriogram 03/18/2019:  Dist R EIA to Prox R CFA lesion is 60% stenosed.  Prox R CIA to Mid R EIA lesion is 20% stenosed.  Prox L CIA to Mid L CFA lesion is 20% stenosed.  Prox L SFA to Dist L SFA lesion is 100% stenosed.  Three-vessel runoff below the knee on the left with brisk flow.   Failed attempt at angioplasty of the left SFA.    Suprarenal Abd AO to Infrarenal Abd AO lesion is 30% stenosed. Severely calcified lesions.  Abdominal aortogram also reveals patent renal arteries 1 on either side. There is no evidence of abdominal aortic aneurysm.  Echocardiogram 11/08/2020:  1. Left ventricular ejection fraction, by  estimation, is 65 to 70%. The left ventricle has normal function. The left ventricle has no regional wall motion abnormalities. Left ventricular diastolic parameters are consistent with Grade I diastolic dysfunction (impaired relaxation).  2. Right ventricular systolic function is normal. The right ventricular size is normal. There is normal pulmonary artery systolic pressure.  3. Left atrial size was mildly dilated.  4. The mitral valve is grossly normal. Trivial mitral valve regurgitation.  5. The aortic valve is tricuspid. Aortic valve regurgitation is not visualized.  6. The inferior vena cava is normal in size with greater than 50% respiratory variability, suggesting right atrial pressure of 3 mmHg.  7. Small PFO suspected.  Compared to 11/26/2018, there was decreased IVC respiratory variation suggestive of elevated right heart pressure and PA pressure was mildly elevated.  PFO was not evident.  EKG:  EKG normal sinus rhythm at rate of 67 bpm with sinus arrhythmia.  Normal axis.  Right bundle branch block.  PACs. No significant change from EKG 11/16/2020.   Assessment     ICD-10-CM   1. Bilateral leg edema  R60.0 PCV ECHOCARDIOGRAM COMPLETE    2. PAD (peripheral artery disease) (HCC)  I73.9     3. Dyspnea on exertion  R06.09 PCV ECHOCARDIOGRAM COMPLETE    4. Essential hypertension  I10     5. Stage 3b chronic kidney disease (HCC)  N18.32  6. Centrilobular emphysema ([place])  J43.2       No orders of the defined types were placed in this encounter.  Medications Discontinued During This Encounter  Medication Reason   docusate sodium (COLACE) 100 MG capsule      Orders Placed This Encounter  Procedures   PCV ECHOCARDIOGRAM COMPLETE    Standing Status:   Future    Standing Expiration Date:   10/07/2022    Recommendations:   [name]  is a 76 y.o. with peripheral artery disease has known long left SFA CTO with failed attempt in September 2020 for revascularization,  also has right calcific iliac artery stenosis which was felt to be high risk due to extension into right common femoral artery for dissection. She has right hip claudication and also left leg claudication that is chronic, symptoms improved with addition of Neurontin.  Past medical history significant for tobacco use disorder, which she quit on 10/21/2019 and has remained abstinent, hypertension, COPD,Stage III chronic kidney disease, history of breast cancer, bilateral venous insufficiency and has had ablation and persistent superficial varicose veins with complications and recurrent varicose vein bleeding, in 2021 needing blood transfusion.   She presented with severe anemia on 09/18/2021 related to upper GI bleed needing blood transfusion, EGD revealing nonbleeding gastric ulcer, acute gastritis and medium-sized hiatal hernia.  She was also treated for chronic hypoxemic respiratory failure secondary to underlying COPD.  She now presents for post-hospital follow-up.  Her main reason for follow-up was worsening leg edema that started prior to hospitalization.  Suspect melena, gradual heme loss leading to potential chronic diastolic heart failure and worsening leg edema.  Advised to complete bedrest for the next 2 to 3 days as she also has venous insufficiency and varicose veins.  She has increased her furosemide from 20 mg to 40 mg daily, continue the same as she has good diuresis.  We discussed regarding avoiding intake of excessive amount of fluids.  I also reviewed her posthospital labs from PCP, renal function is slightly deteriorated.  Suspect this was related to GI bleed and hemolysis.  Would recommend continuing losartan in view of hypertension, recheck BMP in 3 to 4 weeks.  If still elevated, we could reduce the dose of the losartan to 50 mg daily.  With regard to peripheral arterial disease, advised her to continue to hold aspirin for now and will consider restarting aspirin 81 mg daily in 3 to 4  weeks.  In view of leg edema, dyspnea on exertion, we will repeat echocardiogram to specifically look for cor pulmonale and RV dysfunction.  Office visit in 4 weeks for follow-up.  This is a 40-minute office visit encounter in review of complex medical records and external labs.    Adrian Prows, MD, [facility] 10/06/2021, 8:27 PM Office: [phone]

## 2021-10-10 ENCOUNTER — Telehealth: Payer: Self-pay

## 2021-10-10 NOTE — Telephone Encounter (Signed)
She requested that I refill her albuterol. She can hold using this. Furosemide is not on hold. She was taking 2 tablets daily and as per my notes, she should continue to do this until leg swelling improves. Hope this answers her questions

## 2021-10-10 NOTE — Telephone Encounter (Signed)
Patient called because she had some questions on a medication that you recently prescribed her (Albuterol). Patient stated that this medication makes her nauseous and dizzy and wants to know if she doesn't take it, will it affect anything.  Furosemide - HOLD - will she need to continue taking Potassium since she was asked to hold the Lasix. Please advise.   Patient is requesting you to give her a call back.

## 2021-10-11 NOTE — Telephone Encounter (Signed)
Does she need to continue Potassium since she has to ? She also wants to know if she needs to stop her Losartan, because Dr. Dietrich Pates took her off and she wants you to confirm this is ok? Please advise.  Patient wants to know if she needs to stop Aspirin '81mg'$  and pantoprazole '40mg'$ ? Please advise.

## 2021-10-12 NOTE — Telephone Encounter (Signed)
Called patient, [name], LMAM.

## 2021-10-12 NOTE — Telephone Encounter (Signed)
Patient called back, I was unavailable so she asked for someone to call her back. I tried calling her back, for the 4th time, NA, LMAM.

## 2021-10-12 NOTE — Telephone Encounter (Signed)
She had started losartan back on her own and I wanted her to have labs done and then I can decide if she should stop Losartan. Yes take Potassium one tab if she takes furosemide once a day or two if she takes furosemide twice daily. Stop ASA for now. Labs to be done anytime next week

## 2021-10-14 ENCOUNTER — Telehealth: Payer: Self-pay | Admitting: Internal Medicine

## 2021-10-14 ENCOUNTER — Ambulatory Visit: Payer: Medicare Other | Admitting: Internal Medicine

## 2021-10-14 NOTE — Telephone Encounter (Signed)
Spoke with pt and notified her that Dr. Shearon Stalls (per Dr. Shearon Stalls) would evaluate her sore throat during her OV on 10/18/21. Pt stated understanding. Pt also stated she would bring a list of any required paperwork for her motorized wheelchair with her to her OV on 10/18/21. Nothing further needed at this time.

## 2021-10-14 NOTE — Telephone Encounter (Signed)
Patient is returning phone call. Patient phone number is [phone]. 

## 2021-10-14 NOTE — Telephone Encounter (Signed)
Spoke with patient who states that this morning she woke up with sore throat denies any fever, cough or congestion. Also states that she is coming in on Tuesday for OV and for her power mobility device evaluation for her wheelchair.   Please advise

## 2021-10-14 NOTE — Telephone Encounter (Signed)
ATC LMTCB  

## 2021-10-14 NOTE — Telephone Encounter (Signed)
Could you please make sure that if she has a form or specific criteria that she needs filled out/evaluated for her wheelchair that she brings it with her?

## 2021-10-14 NOTE — Telephone Encounter (Addendum)
I called [name] with [employer] at [phone] to see if there were forms that needed to be done at her appt on Tuesday since they were just done in March. [name] informed me that there are some corrections that need to be made to the forms and then they will be good to go. I asked her to fax me the forms. She faxed me the forms and I will give them to Dr. Shearon Stalls on Tuesday so that I can explain what needs to be done on them. Nothing further needed at this time.

## 2021-10-18 ENCOUNTER — Encounter: Payer: Self-pay | Admitting: Internal Medicine

## 2021-10-18 ENCOUNTER — Ambulatory Visit (INDEPENDENT_AMBULATORY_CARE_PROVIDER_SITE_OTHER): Payer: Medicare Other | Admitting: Internal Medicine

## 2021-10-18 VITALS — BP 120/72 | HR 68 | Ht <= 58 in | Wt 119.8 lb

## 2021-10-18 DIAGNOSIS — J449 Chronic obstructive pulmonary disease, unspecified: Secondary | ICD-10-CM | POA: Diagnosis not present

## 2021-10-18 DIAGNOSIS — J9611 Chronic respiratory failure with hypoxia: Secondary | ICD-10-CM | POA: Diagnosis not present

## 2021-10-18 NOTE — Patient Instructions (Signed)
Please schedule follow up scheduled with myself in 6 months.  If my schedule is not open yet, we will contact you with a reminder closer to that time. Please call [phone] if you haven't heard from [name] a month before.   Try taking the albuterol inhaler given in the hospital.  If you want to try a nebulizer machine at home to get breathing treatments, let me know and I will send one for you.

## 2021-10-18 NOTE — Progress Notes (Signed)
Holly S Lich    [id number]    08-10-1945  Primary Care Physician:Hester, Jerene Pitch, NP Date of Appointment: 10/18/2021 Established Patient Visit  Chief complaint:   Chief Complaint  Patient presents with   Follow-up    She is about the same since last OV.      HPI: [name] is a 76 y.o. woman with emphysema on 3LNC continuously who presents with shortness of breath.  Interval Updates: Here for COPD follow up. In April 2023 was hospitalized and had upper GI bleed secondary to nonbleeding gastric ulcer and acute gastritis after presenting with melanotic stools. The melena has since resolved.   Saw Dr. Einar Gip two weeks ago and she had her lasix increased for le edema. She is due to have a repeat echo.  Has not been elevating her legs.  Was given an albuterol inhaler when she left the hospital. She threw it in the trash.now says she is due for a refill and is willing to try it.   Continues to have dyspnea which impairs daily activities and is awaiting a motorized wheelchair.   Has good days and bad days - more bad days when she is trying to be more active.    I have reviewed the patient's family social and past medical history and updated as appropriate.   Past Medical History:  Diagnosis Date   Acute renal failure superimposed on stage 3 chronic kidney disease ([place]) 10/27/2014   Back pain, chronic    Breast cancer ([place]) 09/09/2010   Cancer of colon ([place]) 05/24/2011   Centrilobular emphysema ([place]) 07/15/2018   CKD (chronic kidney disease), stage III (HCC)    Claudication, intermittent ([place]) 07/15/2018   Depression    GIB (gastrointestinal bleeding)    H/O tobacco use, presenting hazards to health Quit Dec 2019 05/03/2011   50 years, up to 3PPD quit last year.    Hx of varicose veins of lower extremity 07/15/2018   Hyperlipidemia    Hypertension    SDH (subdural hematoma) (HCC)    SOB (shortness of breath) 07/15/2018    Past Surgical History:   Procedure Laterality Date   ABDOMINAL AORTOGRAM W/LOWER EXTREMITY Left 03/18/2019   Procedure: ABDOMINAL AORTOGRAM W/LOWER EXTREMITY;  Surgeon: Adrian Prows, MD;  Location: [facility];  Service: Cardiovascular;  Laterality: Left;   BACK SURGERY     BIOPSY  09/20/2021   Procedure: BIOPSY;  Surgeon: Wilford Corner, MD;  Location: [facility];  Service: Gastroenterology;;   BREAST LUMPECTOMY Right 10/10/2010   COLON SURGERY  04/2011   COLONOSCOPY  05/03/2011   Procedure: COLONOSCOPY;  Surgeon: Jeryl Columbia, MD;  Location: [facility];  Service: Endoscopy;  Laterality: N/A;   ESOPHAGOGASTRODUODENOSCOPY (EGD) WITH PROPOFOL N/A 09/20/2021   Procedure: ESOPHAGOGASTRODUODENOSCOPY (EGD) WITH PROPOFOL;  Surgeon: Wilford Corner, MD;  Location: [facility];  Service: Gastroenterology;  Laterality: N/A;   JOINT REPLACEMENT     R&L total shoulder replacements   LUMBAR DISC SURGERY     PARTIAL HYSTERECTOMY     PERIPHERAL VASCULAR INTERVENTION  03/18/2019   Procedure: PERIPHERAL VASCULAR INTERVENTION;  Surgeon: Adrian Prows, MD;  Location: [facility];  Service: Cardiovascular;;  attempted left SFA   SHOULDER SURGERY     TUMOR REMOVAL  Tumor removed R breast    Family History  Problem Relation Age of Onset   Diabetes Mother    Hypertension Mother    Cancer Mother  leukemia   Sudden death Mother    Heart attack Mother    Anesthesia problems Neg Hx    Hypotension Neg Hx    Malignant hyperthermia Neg Hx    Pseudochol deficiency Neg Hx    Breast cancer Neg Hx    Lung disease Neg Hx     Social History   Occupational History   Not on file  Tobacco Use   Smoking status: Former    Packs/day: 0.25    Years: 30.00    Pack years: 7.50    Types: Cigarettes    Quit date: 10/31/2019    Years since quitting: 1.9   Smokeless tobacco: Never   Tobacco comments:    had 6 cigarettes recently 03-10-20  Vaping Use   Vaping Use: Never used  Substance and Sexual Activity    Alcohol use: Yes    Comment: rarely    Drug use: No   Sexual activity: Not Currently     Physical Exam: Blood pressure 120/72, pulse 68, height '4\' 8"'$  (1.422 m), weight 119 lb 12.8 oz (54.3 kg), SpO2 92 %.  Gen:      No acute distress, elderly, chronically ill Lungs:    severe kyphosis and scoliosis CV:         RRR no mrg +LE edema 2+ pitting Ext:   Heberden's nodes consistent with osteoarthritis, thin extremities, decreased muscle bulk and tone MSK: bilateral venous varicosities Neuro: decreased strength in bilateral upper and lower extremities.   Data Reviewed: Imaging: I have personally reviewed the chest xrays June 2022 chronic interstitial prominence, no acute process  PFTs:      Latest Ref Rng & Units 04/08/2020    2:50 PM  PFT Results  FVC-Pre L 1.37    FVC-Predicted Pre % 56    FVC-Post L 1.70    FVC-Predicted Post % 70    Pre FEV1/FVC % % 70    Post FEV1/FCV % % 57    FEV1-Pre L 0.96    FEV1-Predicted Pre % 53    FEV1-Post L 0.96    DLCO uncorrected ml/min/mmHg 9.26    DLCO UNC% % 55    DLCO corrected ml/min/mmHg 9.26    DLCO COR %Predicted % 55    DLVA Predicted % 65    TLC L 4.47    TLC % Predicted % 100    RV % Predicted % 121     I have personally reviewed the patient's PFTs and they show moderately severe airflow limitation with a positive bronchodilator response. There is reduced diffusion capacity as well.   Labs:  Immunization status: Immunization History  Administered Date(s) Administered   PFIZER(Purple Top)SARS-COV-2 Vaccination 08/15/2019, 08/26/2019    Reviewed records from Dr. Einar Gip and her recent hospital stay.   Assessment:  Moderately Severe Emphysema FEV 54% of predicted Severe kyphosis Chronic Respiratory Failure on 3LNC continuously.  Some day tobacco use disorder Severe osteoarthritis  Plan/Recommendations:  Ms. Baka wants to try her albuterol inhaler.  She was prescribed an inhaler when she left the hospital. She  didn't use it and threw it away.  She now says she wants to give it a shot.  We discussed a nebulizer machine. And she wants to wait on this for now.   She continues to smoke occasionally and is precontemplative in smoking cessation.   Will work to get her qualified for motorized wheelchair.   Walker not appropriate due to chronic back pain and decreased upper extremity strength  Order  for Motorized Wheelchair: Patient suffers from COPD, emphysema, kyphoscoliosis, respiratory failure, osteoarthritis which impairs their ability to perform daily activities like bathing, dressing, feeding, grooming, and toileting in the home.  A walker will not resolve' \\issue'$  with performing activities of daily living. A wheelchair will allow patient to safely perform daily activities. Patient is not able to propel themselves in the home using a standard weight due to arm weakness, general weakness, endurance.  Patient can self propel in a motorizwed wheelchair.  I have previously advised her on goals of care and end of life with her and she has been reluctant to engage in conversations about this.   Return to Care: Return in about 6 months (around 04/20/2022).   Lenice Llamas, MD Pulmonary and [place]

## 2021-10-18 NOTE — Telephone Encounter (Signed)
Paperwork has been fixed by Dr. Shearon Stalls and I have faxed it back to [phone]. Called [place] and left her a voicemail letting her know. Nothing further needed at this time.

## 2021-10-22 DIAGNOSIS — J449 Chronic obstructive pulmonary disease, unspecified: Secondary | ICD-10-CM | POA: Diagnosis not present

## 2021-10-25 ENCOUNTER — Encounter (HOSPITAL_COMMUNITY): Payer: Self-pay

## 2021-10-25 ENCOUNTER — Emergency Department (HOSPITAL_COMMUNITY): Payer: Medicare Other

## 2021-10-25 ENCOUNTER — Encounter: Payer: Self-pay | Admitting: Student

## 2021-10-25 ENCOUNTER — Emergency Department (HOSPITAL_COMMUNITY)
Admission: EM | Admit: 2021-10-25 | Discharge: 2021-10-25 | Disposition: A | Discharge status: Home / Self Care | Payer: Medicare Other | Attending: Emergency Medicine | Admitting: Emergency Medicine

## 2021-10-25 ENCOUNTER — Ambulatory Visit: Payer: Medicare Other | Admitting: Student

## 2021-10-25 VITALS — Temp 98.0°F | Resp 17 | Ht <= 58 in | Wt 97.6 lb

## 2021-10-25 DIAGNOSIS — J42 Unspecified chronic bronchitis: Secondary | ICD-10-CM | POA: Insufficient documentation

## 2021-10-25 DIAGNOSIS — R400 Somnolence: Secondary | ICD-10-CM

## 2021-10-25 DIAGNOSIS — R0609 Other forms of dyspnea: Secondary | ICD-10-CM | POA: Diagnosis not present

## 2021-10-25 DIAGNOSIS — Z853 Personal history of malignant neoplasm of breast: Secondary | ICD-10-CM | POA: Diagnosis not present

## 2021-10-25 DIAGNOSIS — N289 Disorder of kidney and ureter, unspecified: Secondary | ICD-10-CM | POA: Diagnosis not present

## 2021-10-25 DIAGNOSIS — R410 Disorientation, unspecified: Secondary | ICD-10-CM | POA: Diagnosis present

## 2021-10-25 DIAGNOSIS — R9431 Abnormal electrocardiogram [ECG] [EKG]: Secondary | ICD-10-CM | POA: Diagnosis not present

## 2021-10-25 DIAGNOSIS — I10 Essential (primary) hypertension: Secondary | ICD-10-CM | POA: Diagnosis not present

## 2021-10-25 DIAGNOSIS — J9611 Chronic respiratory failure with hypoxia: Secondary | ICD-10-CM

## 2021-10-25 DIAGNOSIS — Z85038 Personal history of other malignant neoplasm of large intestine: Secondary | ICD-10-CM | POA: Diagnosis not present

## 2021-10-25 DIAGNOSIS — R0602 Shortness of breath: Secondary | ICD-10-CM | POA: Diagnosis not present

## 2021-10-25 LAB — CBC WITH DIFFERENTIAL/PLATELET
Abs Immature Granulocytes: 0.01 10*3/uL (ref 0.00–0.07)
Basophils Absolute: 0 10*3/uL (ref 0.0–0.1)
Basophils Relative: 1 %
Eosinophils Absolute: 0 10*3/uL (ref 0.0–0.5)
Eosinophils Relative: 0 %
HCT: 35.6 % — ABNORMAL LOW (ref 36.0–46.0)
Hemoglobin: 11.4 g/dL — ABNORMAL LOW (ref 12.0–15.0)
Immature Granulocytes: 0 %
Lymphocytes Relative: 24 %
Lymphs Abs: 0.9 10*3/uL (ref 0.7–4.0)
MCH: 29.2 pg (ref 26.0–34.0)
MCHC: 32 g/dL (ref 30.0–36.0)
MCV: 91.3 fL (ref 80.0–100.0)
Monocytes Absolute: 0.4 10*3/uL (ref 0.1–1.0)
Monocytes Relative: 10 %
Neutro Abs: 2.4 10*3/uL (ref 1.7–7.7)
Neutrophils Relative %: 65 %
Platelets: 184 10*3/uL (ref 150–400)
RBC: 3.9 MIL/uL (ref 3.87–5.11)
RDW: 13.2 % (ref 11.5–15.5)
WBC: 3.7 10*3/uL — ABNORMAL LOW (ref 4.0–10.5)
nRBC: 0 % (ref 0.0–0.2)

## 2021-10-25 LAB — COMPREHENSIVE METABOLIC PANEL
ALT: 19 U/L (ref 0–44)
AST: 31 U/L (ref 15–41)
Albumin: 3.8 g/dL (ref 3.5–5.0)
Alkaline Phosphatase: 79 U/L (ref 38–126)
Anion gap: 12 (ref 5–15)
BUN: 37 mg/dL — ABNORMAL HIGH (ref 8–23)
CO2: 29 mmol/L (ref 22–32)
Calcium: 10.8 mg/dL — ABNORMAL HIGH (ref 8.9–10.3)
Chloride: 101 mmol/L (ref 98–111)
Creatinine, Ser: 2.72 mg/dL — ABNORMAL HIGH (ref 0.44–1.00)
GFR, Estimated: 18 mL/min — ABNORMAL LOW (ref >60)
Glucose, Bld: 106 mg/dL — ABNORMAL HIGH (ref 70–99)
Potassium: 3.6 mmol/L (ref 3.5–5.1)
Sodium: 142 mmol/L (ref 135–145)
Total Bilirubin: 0.7 mg/dL (ref 0.3–1.2)
Total Protein: 6.6 g/dL (ref 6.5–8.1)

## 2021-10-25 LAB — TYPE AND SCREEN
ABO/RH(D): O NEG
Antibody Screen: NEGATIVE

## 2021-10-25 LAB — TROPONIN I (HIGH SENSITIVITY)
Troponin I (High Sensitivity): 22 ng/L — ABNORMAL HIGH (ref <18)
Troponin I (High Sensitivity): 68 ng/L — ABNORMAL HIGH (ref <18)

## 2021-10-25 NOTE — ED Notes (Signed)
Urine requested  ?

## 2021-10-25 NOTE — Progress Notes (Signed)
Primary Physician/Referring:  Almedia Balls, NP  Patient ID: [name], female    DOB: [birth year], 76 y.o.   MRN: [id number]  Chief complaints: hospital follow up  HPI: [name]  is a 76 y.o. female  with peripheral artery disease has known long left SFA CTO with failed attempt in September 2020 for revascularization, also has right calcific iliac artery stenosis which was felt to be high risk due to extension into right common femoral artery for dissection. She has right hip claudication and also left leg claudication that is chronic but has remained stable.  Past medical history significant for tobacco use disorder, which she quit on 10/21/2019, hypertension, COPD,Stage III chronic kidney disease, history of breast cancer, bilateral venous insufficiency and has had ablation and persistent superficial varicose veins with complications and recurrent varicose vein bleeding.  She presented with severe anemia on 09/18/2021 related to upper GI bleed needing blood transfusion, EGD revealing nonbleeding gastric ulcer, acute gastritis and medium-sized hiatal hernia.  She was also treated for chronic hypoxemic respiratory failure secondary to underlying COPD.    Patient presented to our office 10/06/2021 for hospital follow-up with Dr. Einar Gip.  At that time patient with acute on chronic diastolic heart failure and worsening leg edema.  She was advised to increase furosemide to 40 mg daily and discontinue losartan.  Patient presents with her niece today for an urgent visit at her request with concerns of worsening fatigue, shortness of breath.  Leg edema has improved since last office visit and patient's weight has trended down, however patient has fallen 3 times in the last several days and patient's niece has noticed significant fatigue.   Past Medical History:  Diagnosis Date   Acute renal failure superimposed on stage 3 chronic kidney disease ([place]) 10/27/2014   Back pain, chronic     Breast cancer ([place]) 09/09/2010   Cancer of colon ([place]) 05/24/2011   Centrilobular emphysema ([place]) 07/15/2018   CKD (chronic kidney disease), stage III (HCC)    Claudication, intermittent ([place]) 07/15/2018   Depression    GIB (gastrointestinal bleeding)    H/O tobacco use, presenting hazards to health Quit Dec 2019 05/03/2011   50 years, up to 3PPD quit last year.    Hx of varicose veins of lower extremity 07/15/2018   Hyperlipidemia    Hypertension    SDH (subdural hematoma) (HCC)    SOB (shortness of breath) 07/15/2018    Social History   Tobacco Use   Smoking status: Former    Packs/day: 0.25    Years: 30.00    Pack years: 7.50    Types: Cigarettes    Quit date: 10/31/2019    Years since quitting: 1.9   Smokeless tobacco: Never   Tobacco comments:    had 6 cigarettes recently 03-10-20  Substance Use Topics   Alcohol use: Yes    Comment: rarely   Marital Status: Divorced   Review of Systems  Constitutional: Positive for malaise/fatigue.  Cardiovascular:  Positive for claudication (very mild) and dyspnea on exertion. Negative for chest pain, leg swelling and orthopnea.  Respiratory:  Positive for cough.   Musculoskeletal:  Positive for falls.  Objective  Temperature 98 F (36.7 C), temperature source Temporal, resp. rate 17, height '4\' 8"'  (1.422 m), weight 97 lb 9.6 oz (44.3 kg), SpO2 99 %. Body mass index is 21.88 kg/m.     10/25/2021   11:47 AM 10/25/2021   10:47 AM 10/18/2021    2:13 PM  Vitals  with BMI  Height  '4\' 8"'  '4\' 8"'   Weight  97 lbs 10 oz 119 lbs 13 oz  BMI  01.75 10.25  Systolic 852  778  Diastolic 57  72  Pulse 59  68     Orthostatic VS for the past 72 hrs (Last 3 readings):  Orthostatic BP Patient Position BP Location Cuff Size Orthostatic Pulse  10/25/21 1050 100/42 Standing Left Arm Normal 71  10/25/21 1048 (!) 85/41 Sitting Left Arm Normal 63  10/25/21 1047 (!) 83/48 Supine Left Arm Normal 60    Physical Exam Vitals reviewed.  Constitutional:       Appearance: She is well-developed.  Neck:     Vascular: No carotid bruit or JVD.  Cardiovascular:     Rate and Rhythm: Normal rate and regular rhythm. No extrasystoles are present.    Pulses:          Femoral pulses are 2+ on the right side with bruit.      Popliteal pulses are 1+ on the right side and 1+ on the left side.       Dorsalis pedis pulses are 1+ on the right side and 0 on the left side.       Posterior tibial pulses are 0 on the left side.     Heart sounds: Normal heart sounds.  Pulmonary:     Effort: Pulmonary effort is normal. No accessory muscle usage or respiratory distress.     Breath sounds: Rales (Occasional scattered) present. No wheezing.  Musculoskeletal:     Right lower leg: Edema (1-2+ pitting) present.     Left lower leg: Edema (1-2+ pitting) present.  Skin:    Capillary Refill: Capillary refill takes less than 2 seconds.     Findings: No erythema.  Neurological:     Comments: Patient is somnolent on exam and appears mildly confused    Radiology: No results found.  Laboratory examination:       Latest Ref Rng & Units 09/21/2021    5:29 AM 09/20/2021    6:11 AM 09/19/2021    6:05 AM  CMP  Glucose 70 - 99 mg/dL 103   89   99    BUN 8 - 23 mg/dL 32   58   94    Creatinine 0.44 - 1.00 mg/dL 1.08   1.15   1.26    Sodium 135 - 145 mmol/L 142   143   141    Potassium 3.5 - 5.1 mmol/L 4.6   4.2   3.9    Chloride 98 - 111 mmol/L 107   108   107    CO2 22 - 32 mmol/L 30   32   31    Calcium 8.9 - 10.3 mg/dL 10.3   10.0   10.0    Total Protein 6.5 - 8.1 g/dL   4.9    Total Bilirubin 0.3 - 1.2 mg/dL   0.4    Alkaline Phos 38 - 126 U/L   52    AST 15 - 41 U/L   23    ALT 0 - 44 U/L   15        Latest Ref Rng & Units 09/22/2021    5:29 AM 09/21/2021    6:21 PM 09/21/2021    5:29 AM  CBC  WBC 4.0 - 10.5 K/uL 3.0    2.7    Hemoglobin 12.0 - 15.0 g/dL 8.6   9.1   7.2    Hematocrit  36.0 - 46.0 % 27.0   28.6   22.4    Platelets 150 - 400 K/uL 121    127      Lipid Panel     Component Value Date/Time   CHOL 132 06/12/2019 1502   TRIG 62 06/12/2019 1502   HDL 73 06/12/2019 1502   CHOLHDL 1.8 06/12/2019 1502   LDLCALC 46 06/12/2019 1502   HEMOGLOBIN A1C Lab Results  Component Value Date   HGBA1C 6.1 (H) 10/28/2014   MPG 128 10/28/2014   TSH No results for input(s): TSH in the last 8760 hours.  External labs:  Labs 10/04/2021:  Hb 9.7/HCT 29.6, platelets 245, normal indicis.  BUN 28, creatinine 1.61, EGFR 33 mL, potassium 4.2.  LFTs normal.  Labs 03/03/2021:  Hb 12.3/HCT 36.7, platelets 216.  Normal indicis.  Serum glucose 82 mg, BUN 28, creatinine 1.33, EGFR 42 mL, potassium 4.1, LFTs normal.  Magnesium 2.0.  Lipid profile 07/28/2020:  Total cholesterol 149, triglycerides 74, HDL 78, LDL 57.   Cardiac Studies:   Lower Extremity Arterial Duplex 01/14/2019: There is monophasic waveform noted in the right external iliac and CFA suggests proximal significant disease. There is severe diffuse mixed plaque noted throughout the right lower extremity.  Moderate velocity increase at the left distal superficial femoral artery, >50% stenosis. There is severe diffuse mixed plaque throughout the left lower extremity This exam reveals moderately decreased perfusion of the right lower extremity, noted at the dorsalis pedis artery level (ABI 0.75) and moderately decreased perfusion of the left lower extremity, noted at the post tibial artery level (ABI 0.58).  No significant change from report of 01/11/2015.   Peripheral arteriogram 03/18/2019:  Dist R EIA to Prox R CFA lesion is 60% stenosed.  Prox R CIA to Mid R EIA lesion is 20% stenosed.  Prox L CIA to Mid L CFA lesion is 20% stenosed.  Prox L SFA to Dist L SFA lesion is 100% stenosed.  Three-vessel runoff below the knee on the left with brisk flow.   Failed attempt at angioplasty of the left SFA.    Suprarenal Abd AO to Infrarenal Abd AO lesion is 30% stenosed. Severely  calcified lesions.  Abdominal aortogram also reveals patent renal arteries 1 on either side. There is no evidence of abdominal aortic aneurysm.  Echocardiogram 11/08/2020:  1. Left ventricular ejection fraction, by estimation, is 65 to 70%. The left ventricle has normal function. The left ventricle has no regional wall motion abnormalities. Left ventricular diastolic parameters are consistent with Grade I diastolic dysfunction (impaired relaxation).  2. Right ventricular systolic function is normal. The right ventricular size is normal. There is normal pulmonary artery systolic pressure.  3. Left atrial size was mildly dilated.  4. The mitral valve is grossly normal. Trivial mitral valve regurgitation.  5. The aortic valve is tricuspid. Aortic valve regurgitation is not visualized.  6. The inferior vena cava is normal in size with greater than 50% respiratory variability, suggesting right atrial pressure of 3 mmHg.  7. Small PFO suspected.  Compared to 11/26/2018, there was decreased IVC respiratory variation suggestive of elevated right heart pressure and PA pressure was mildly elevated.  PFO was not evident.  EKG:  EKG normal sinus rhythm at rate of 67 bpm with sinus arrhythmia.  Normal axis.  Right bundle branch block.  PACs. No significant change from EKG 11/16/2020.   Assessment     ICD-10-CM   1. Dyspnea on exertion  R06.09     2.  Somnolence  R40.0     3. Chronic hypoxemic respiratory failure (HCC)  J96.11        No orders of the defined types were placed in this encounter.  Medications Discontinued During This Encounter  Medication Reason   amLODipine (NORVASC) 5 MG tablet Patient has not taken in last 30 days      No orders of the defined types were placed in this encounter.   Recommendations:   [name]  is a 76 y.o. with peripheral artery disease has known long left SFA CTO with failed attempt in September 2020 for revascularization, also has right calcific  iliac artery stenosis which was felt to be high risk due to extension into right common femoral artery for dissection. She has right hip claudication and also left leg claudication that is chronic but has remained stable.  Past medical history significant for tobacco use disorder, which she quit on 10/21/2019, hypertension, COPD,Stage III chronic kidney disease, history of breast cancer, bilateral venous insufficiency and has had ablation and persistent superficial varicose veins with complications and recurrent varicose vein bleeding.  She presented with severe anemia on 09/18/2021 related to upper GI bleed needing blood transfusion, EGD revealing nonbleeding gastric ulcer, acute gastritis and medium-sized hiatal hernia.  She was also treated for chronic hypoxemic respiratory failure secondary to underlying COPD.    Patient presented to our office 10/06/2021 for hospital follow-up with Dr. Einar Gip.  Last office visit patient was in acute on chronic heart failure, therefore diuretics were increased and losartan was discontinued.  She presents today hypotensive with worsening shortness of breath and somnolent on exam.  Clinically patient is improved from a volume status standpoint.  However given current symptoms I am concerned hemoglobin may have dropped further and she may need blood transfusion.  Given concern for potential need for transfusion as well as potential COPD exacerbation shared decision was for patient to be further evaluated in the emergency department.  Patient is agreeable.   Alethia Berthold, PA-C 10/25/2021, 11:56 AM Office: [phone]

## 2021-10-25 NOTE — ED Triage Notes (Signed)
Pt presents for worsening SOB over the last few days. Pt has COPD, on 3-4 lpm O2 chronically.  Pt recently admitted for acute upper GI bleed. Pt denies any pain, melena, hematochezia.

## 2021-10-25 NOTE — Discharge Instructions (Signed)
For now we need to stop taking the furosemide (Lasix), for at least 3 days.  Dr. Nadyne Coombes will call you, to tell you when to restart it and to arrange follow-up care to include retesting your blood counts.  Continue all of your other medications as usual.  Return here, if needed.

## 2021-10-25 NOTE — ED Provider Notes (Signed)
[facility] DEPT Provider Note   CSN: [id number] Arrival date & time: 10/25/21  1137     History  Chief Complaint  Patient presents with   Shortness of Breath    [name] is a 76 y.o. female.  HPI She presents at the request of her cardiology provider for evaluation of confusion and "to get blood test done."  She apparently saw the cardiologist this morning because of "confusion".  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  She was recently admitted to the hospital for GI bleed.  She saw her pulmonary provider on 10/18/2021.  They encouraged her to use albuterol and are working on getting her a motorized wheelchair because of severe dyspnea with ambulation. Patient with a history of hypertension, COPD, anemia, colon cancer, breast cancer and renal insufficiency.  Patient is here with a friend who gives all the history as the patient is somewhat confused.  Home Medications Prior to Admission medications   Medication Sig Start Date End Date Taking? Authorizing Provider  acetaminophen (TYLENOL) 325 MG tablet Take 2 tablets (650 mg total) by mouth every 6 (six) hours as needed for mild pain (or Fever >/= 101). 11/09/20  Yes Sheikh, Omair Latif, DO  albuterol (VENTOLIN HFA) 108 (90 Base) MCG/ACT inhaler Inhale 2 puffs into the lungs every 6 (six) hours as needed for wheezing or shortness of breath. 09/23/21  Yes Rai, Ripudeep K, MD  ALPRAZolam (XANAX) 1 MG tablet Take 1 tablet (1 mg total) by mouth 2 (two) times daily as needed. Patient taking differently: Take 1 mg by mouth 3 (three) times daily as needed for anxiety. 11/09/20  Yes Sheikh, Omair Latif, DO  atorvastatin (LIPITOR) 10 MG tablet TAKE 1 TABLET BY MOUTH EVERY DAY Patient taking differently: Take 10 mg by mouth daily. 02/14/21  Yes Adrian Prows, MD  buPROPion (WELLBUTRIN XL) 150 MG 24 hr tablet Take 150 mg by  mouth daily.   Yes [provider]  Cholecalciferol 250 MCG (10000 UT) CAPS Take 1 capsule by mouth every evening.   Yes [provider]  furosemide (LASIX) 20 MG tablet Take 1 tablet (20 mg total) by mouth daily. 02/24/21  Yes Adrian Prows, MD  gabapentin (NEURONTIN) 100 MG capsule Take 100 mg by mouth 3 (three) times daily.   Yes [provider]  hydrOXYzine (ATARAX) 10 MG tablet Take 10 mg by mouth 2 (two) times daily as needed for anxiety.   Yes [provider]  mirtazapine (REMERON) 30 MG tablet Take 30 mg by mouth at bedtime. 04/22/16  Yes [provider]  Multiple Vitamins-Minerals (MULTI FOR HER 50+) TABS Take 1 tablet by mouth daily.   Yes [provider]  oxyCODONE (OXYCONTIN) 60 MG 12 hr tablet Take 1 tablet by mouth every 8 (eight) hours.   Yes [provider]  potassium chloride (KLOR-CON) 10 MEQ tablet TAKE 1 TABLET (10 MEQ TOTAL) BY MOUTH DAILY. TAKE WITH LASIX Patient taking differently: Take 10 mEq by mouth daily. 05/30/21  Yes Adrian Prows, MD  rOPINIRole (REQUIP) 1 MG tablet Take 1 mg by mouth at bedtime.   Yes [provider]  vitamin B-12 1000 MCG tablet Take 1 tablet (1,000 mcg total) by mouth daily. 09/24/21  Yes Rai, Ripudeep K, MD  gabapentin (NEURONTIN) 300 MG capsule Take 1 capsule (300 mg total) by mouth 3 (three) times daily. Patient not  taking: Reported on 10/25/2021 09/23/21   Rai, Vernelle Emerald, MD  pantoprazole (PROTONIX) 40 MG tablet Take 1 tablet (40 mg total) by mouth 2 (two) times daily before a meal. Patient not taking: Reported on 10/25/2021 09/23/21 11/22/21  Rai, Vernelle Emerald, MD  prochlorperazine (COMPAZINE) 10 MG tablet Take 1 tablet (10 mg total) by mouth every 6 (six) hours as needed for nausea or vomiting. Patient not taking: Reported on 10/25/2021 09/23/21   Mendel Corning, MD      Allergies    Patient has no known allergies.    Review of Systems   Review of Systems  Physical Exam Updated Vital  Signs BP 140/79   Pulse (!) 114   Temp 98.4 F (36.9 C) (Oral)   Resp 14   SpO2 93%  Physical Exam  ED Results / Procedures / Treatments   Labs (all labs ordered are listed, but only abnormal results are displayed) Labs Reviewed  CBC WITH DIFFERENTIAL/PLATELET - Abnormal; Notable for the following components:      Result Value   WBC 3.7 (*)    Hemoglobin 11.4 (*)    HCT 35.6 (*)    All other components within normal limits  COMPREHENSIVE METABOLIC PANEL - Abnormal; Notable for the following components:   Glucose, Bld 106 (*)    BUN 37 (*)    Creatinine, Ser 2.72 (*)    Calcium 10.8 (*)    GFR, Estimated 18 (*)    All other components within normal limits  TROPONIN I (HIGH SENSITIVITY) - Abnormal; Notable for the following components:   Troponin I (High Sensitivity) 22 (*)    All other components within normal limits  TROPONIN I (HIGH SENSITIVITY) - Abnormal; Notable for the following components:   Troponin I (High Sensitivity) 68 (*)    All other components within normal limits  URINALYSIS, ROUTINE W REFLEX MICROSCOPIC  TYPE AND SCREEN    EKG EKG Interpretation  Date/Time:  Tuesday October 25 2021 15:44:59 EDT Ventricular Rate:  51 PR Interval:  156 QRS Duration: 146 QT Interval:  471 QTC Calculation: 434 R Axis:   45 Text Interpretation: Sinus rhythm Right bundle branch block Since last tracing rate slower and PVCs resolved Otherwise no significant change Confirmed by Daleen Bo [phone]) on 10/25/2021 3:49:33 PM  Radiology DG Chest 2 View  Result Date: 10/25/2021 CLINICAL DATA:  Shortness of breath EXAM: CHEST - 2 VIEW COMPARISON:  None Available. FINDINGS: Unchanged cardiomediastinal silhouette. There is patchy airspace disease in the periphery of the right lower lung. No pleural effusion. No pnuemothorax. Old right upper rib fractures. Bilateral shoulder arthroplasties. Partially visualized lumbar spine fusion hardware. Severe degenerative changes of the thoracic  spine with exaggerated kyphosis. IMPRESSION: Patchy airspace disease in the periphery of the right lower lung, suspicious for pneumonia. Recommend PA and lateral chest radiograph in 6-12 weeks to ensure resolution. Electronically Signed   By: Maurine Simmering M.D.   On: 10/25/2021 12:54    Procedures Procedures    Medications Ordered in ED Medications - No data to display  ED Course/ Medical Decision Making/ A&P                           Medical Decision Making Patient here for evaluation of confusion as well as ongoing shortness of breath and general weakness.  She came here after seeing a cardiology provider earlier today.  Amount and/or Complexity of Data Reviewed Independent Historian: caregiver  Details: Patient's friend who is her healthcare power of attorney gives the history. Labs: ordered.    Details: CBC, metabolic panel, troponin-normal except mild elevation of troponin.  Patient has chronic troponin elevation, and new renal insufficiency likely causing troponin elevation.  Hemoglobin slightly low, 11.4.  BUN high, creatinine high, GFR low Radiology: ordered and independent interpretation performed.    Details: Chest x-ray-no clear acute pulmonary infiltrative process. ECG/medicine tests: ordered and independent interpretation performed.    Details: Cardiac monitor-normal sinus rhythm Discussion of management or test interpretation with external provider(s): I discussed the case with the pulmonary provider, Ms. Shearon Stalls, who saw her last week.  We reviewed the chest x-ray together.  Patient does not have a convincing radiographic abnormality and does not have clinical evidence for pneumonia at this time.  We will not proceed with antibiotic treatment for pneumonia, or add prednisone at this time   Case discussed with Dr. Nadyne Coombes, her cardiologist.  He will follow-up with the patient regarding her AKI and give her instructions on restarting Lasix after 3-day  holiday.   Risk Prescription drug management. Decision regarding hospitalization. Risk Details: Patient presenting for confusion primarily.  She went to see her neurologist morning who was concerned about shortness of breath and confusion.  Also there was concern for ongoing GI bleeding, with recommended repeat labs.  Patient's repeat hemoglobin is normal.  She not having chest pain.  She has chronic ongoing shortness of breath is stable on 4 L nasal cannula oxygen as usual.  She is not having chest pain.  She has chronic troponin elevation, with new renal insufficiency likely contributing to mild troponin elevation.  Doubt ACS, PE, pneumonia or impending vascular collapse.  Patient plans to follow-up with her cardiologist as outpatient.  Instructed return here for worsening symptoms or other concerns.           Final Clinical Impression(s) / ED Diagnoses Final diagnoses:  Chronic bronchitis, unspecified chronic bronchitis type ([place])  Renal insufficiency    Rx / DC Orders ED Discharge Orders     None         Daleen Bo, MD 10/25/21 1900

## 2021-10-25 NOTE — ED Notes (Signed)
Pt given apple juice  

## 2021-10-25 NOTE — ED Provider Triage Note (Signed)
Emergency Medicine Provider Triage Evaluation Note  [name] , a 76 y.o. female  was evaluated in triage.  Pt complains of shortness of breath, weakness.  Pt had a Gi bleed recently  Review of Systems  Positive: Weakness  Negative: fever  Physical Exam  BP (!) 114/57 (BP Location: Left Arm)   Pulse (!) 59   Temp 98.4 F (36.9 C) (Oral)   Resp 20   SpO2 98%  Gen:   Awake, sleepy  Resp:  Normal effort  MSK:   Moves extremities without difficulty  Other:    Medical Decision Making  Medically screening exam initiated at 12:13 PM.  Appropriate orders placed.  Holly S Cirigliano was informed that the remainder of the evaluation will be completed by another provider, this initial triage assessment does not replace that evaluation, and the importance of remaining in the ED until their evaluation is complete.     Fransico Meadow, [place] 10/25/21 1214

## 2021-10-26 ENCOUNTER — Telehealth: Payer: Self-pay | Admitting: Student

## 2021-10-26 DIAGNOSIS — R0609 Other forms of dyspnea: Secondary | ICD-10-CM

## 2021-10-27 NOTE — Telephone Encounter (Signed)
Reviewed and discussed recent ED visit with patient's contact person Anderson Malta.  Advised patient to continue to hold daily Lasix and only take as needed.  We will repeat BMP and BNP.  Also advised patient to follow-up with PCP urgently for further evaluation of noncardiac causes of her fatigue and confusion.

## 2021-10-28 ENCOUNTER — Telehealth: Payer: Self-pay

## 2021-10-28 NOTE — Telephone Encounter (Signed)
Patient called about some questions and concerns and is asking for you to call her sometime today or this weekend. Not urgent, but would like a call please.

## 2021-10-29 NOTE — Telephone Encounter (Signed)
Patient always comes in with her friend Anderson Malta, it appears that Anderson Malta has taken over her medications and patient is not happy about this.  She would like to manage her own medications.  I advised her that she should confront her directly herself but I am happy to inform Anderson Malta on her next office visit regarding your wish.

## 2021-11-03 DIAGNOSIS — H26492 Other secondary cataract, left eye: Secondary | ICD-10-CM | POA: Diagnosis not present

## 2021-11-04 ENCOUNTER — Ambulatory Visit: Payer: Medicare Other

## 2021-11-04 DIAGNOSIS — R0609 Other forms of dyspnea: Secondary | ICD-10-CM

## 2021-11-04 DIAGNOSIS — R600 Localized edema: Secondary | ICD-10-CM | POA: Diagnosis not present

## 2021-11-05 LAB — BASIC METABOLIC PANEL
BUN/Creatinine Ratio: 20 (ref 12–28)
BUN: 27 mg/dL (ref 8–27)
CO2: 26 mmol/L (ref 20–29)
Calcium: 11.4 mg/dL — ABNORMAL HIGH (ref 8.7–10.3)
Chloride: 101 mmol/L (ref 96–106)
Creatinine, Ser: 1.37 mg/dL — ABNORMAL HIGH (ref 0.57–1.00)
Glucose: 107 mg/dL — ABNORMAL HIGH (ref 70–99)
Potassium: 3.8 mmol/L (ref 3.5–5.2)
Sodium: 143 mmol/L (ref 134–144)
eGFR: 40 mL/min/{1.73_m2} — ABNORMAL LOW (ref >59)

## 2021-11-05 LAB — BRAIN NATRIURETIC PEPTIDE: BNP: 199.9 pg/mL — ABNORMAL HIGH (ref 0.0–100.0)

## 2021-11-06 NOTE — Progress Notes (Signed)
Stable echo and will discuss on OV soon

## 2021-11-09 ENCOUNTER — Encounter: Payer: Self-pay | Admitting: Cardiology

## 2021-11-09 ENCOUNTER — Ambulatory Visit: Payer: Medicare Other | Admitting: Cardiology

## 2021-11-09 VITALS — BP 182/96 | HR 65 | Temp 98.0°F | Resp 17 | Ht <= 58 in | Wt 99.4 lb

## 2021-11-09 DIAGNOSIS — K2100 Gastro-esophageal reflux disease with esophagitis, without bleeding: Secondary | ICD-10-CM | POA: Diagnosis not present

## 2021-11-09 DIAGNOSIS — J9611 Chronic respiratory failure with hypoxia: Secondary | ICD-10-CM | POA: Diagnosis not present

## 2021-11-09 DIAGNOSIS — I10 Essential (primary) hypertension: Secondary | ICD-10-CM

## 2021-11-09 DIAGNOSIS — I739 Peripheral vascular disease, unspecified: Secondary | ICD-10-CM

## 2021-11-09 DIAGNOSIS — E7800 Pure hypercholesterolemia, unspecified: Secondary | ICD-10-CM

## 2021-11-09 DIAGNOSIS — I5032 Chronic diastolic (congestive) heart failure: Secondary | ICD-10-CM | POA: Diagnosis not present

## 2021-11-09 MED ORDER — LOSARTAN POTASSIUM-HCTZ 100-25 MG PO TABS: 1.0000 | ORAL_TABLET | ORAL | 2 refills | Status: DC

## 2021-11-09 MED ORDER — CYANOCOBALAMIN 1000 MCG PO TABS: 1000.0000 ug | ORAL_TABLET | Freq: Every day | ORAL | 3 refills | Status: DC

## 2021-11-09 MED ORDER — METOPROLOL SUCCINATE ER 50 MG PO TB24: 50.0000 mg | ORAL_TABLET | Freq: Every day | ORAL | 2 refills | Status: DC

## 2021-11-09 MED ORDER — FUROSEMIDE 20 MG PO TABS: 20.0000 mg | ORAL_TABLET | Freq: Every day | ORAL | 2 refills | Status: DC | PRN

## 2021-11-09 MED ORDER — PANTOPRAZOLE SODIUM 40 MG PO TBEC: 40.0000 mg | DELAYED_RELEASE_TABLET | Freq: Every day | ORAL | 1 refills | Status: DC

## 2021-11-09 MED ORDER — POTASSIUM CHLORIDE ER 10 MEQ PO TBCR: 10.0000 meq | EXTENDED_RELEASE_TABLET | Freq: Every day | ORAL | 1 refills | Status: DC | PRN

## 2021-11-09 MED ORDER — VITAMIN D (CHOLECALCIFEROL) 25 MCG (1000 UT) PO TABS: 1.0000 | ORAL_TABLET | Freq: Every day | ORAL | 3 refills | Status: DC

## 2021-11-09 MED ORDER — ATORVASTATIN CALCIUM 10 MG PO TABS: 10.0000 mg | ORAL_TABLET | Freq: Every day | ORAL | 3 refills | Status: DC

## 2021-11-09 NOTE — Progress Notes (Signed)
Primary Physician/Referring:  Almedia Balls, NP  Patient ID: Holly S Codrington, female    DOB: May 11, 1946, 76 y.o.   MRN: [id number]  Chief Complaint  Patient presents with   Shortness of Breath    HPI: [name]  is a 76 y.o. female  with peripheral artery disease has known long left SFA CTO with failed attempt in September 2020 for revascularization, also has right calcific iliac artery stenosis which was felt to be high risk due to extension into right common femoral artery for dissection. She has right hip claudication and also left leg claudication that is chronic but has remained stable.  Past medical history significant for tobacco use disorder, which she quit on 10/21/2019, but started smoking about 2 weeks ago 10/29/21, hypertension, COPD,Stage III chronic kidney disease, history of breast cancer, bilateral venous insufficiency and has had ablation and persistent superficial varicose veins with complications and recurrent varicose vein bleeding and h/o  UGI bleed on 09/18/2021 needing blood transfusion, EGD revealing nonbleeding gastric ulcer, acute gastritis and medium-sized hiatal hernia.    She was seen by [name] on 10/25/2021 and also on the same day in the neurology office and was recommended to go to the emergency room due to potential for anemia and altered mental status and hypoxemia.  In the ED she was found to have acute renal failure and diuretics were discontinued and discharged home.  She now presents for follow-up, renal function has returned to normal baseline stage IIIa chronic kidney disease.  However of note patient wanted to have control of all her medications herself as her friend Anderson Malta was controlling the medications, she has stopped taking all the medications except the pain medications and Xanax.  That she is feeling the best she has in a while.   Past Medical History:  Diagnosis Date   Acute renal failure superimposed on stage 3 chronic kidney disease ([place])  10/27/2014   Back pain, chronic    Breast cancer ([place]) 09/09/2010   Cancer of colon ([place]) 05/24/2011   Centrilobular emphysema ([place]) 07/15/2018   CKD (chronic kidney disease), stage III (HCC)    Claudication, intermittent ([place]) 07/15/2018   Depression    GIB (gastrointestinal bleeding)    H/O tobacco use, presenting hazards to health Quit Dec 2019 05/03/2011   50 years, up to 3PPD quit last year.    Hx of varicose veins of lower extremity 07/15/2018   Hyperlipidemia    Hypertension    SDH (subdural hematoma) (HCC)    SOB (shortness of breath) 07/15/2018    Social History   Tobacco Use   Smoking status: Former    Packs/day: 0.25    Years: 30.00    Total pack years: 7.50    Types: Cigarettes    Quit date: 10/31/2019    Years since quitting: 2.0   Smokeless tobacco: Never   Tobacco comments:    had 6 cigarettes recently 03-10-20  Substance Use Topics   Alcohol use: Yes    Comment: rarely   Marital Status: Divorced   Review of Systems  Constitutional: Positive for malaise/fatigue.  Cardiovascular:  Positive for claudication (very mild) and dyspnea on exertion. Negative for chest pain, leg swelling and orthopnea.   Objective  Blood pressure (!) 182/96, pulse 65, temperature 98 F (36.7 C), temperature source Temporal, resp. rate 17, height _0  (1.422 m), weight 99 lb 6.4 oz (45.1 kg), SpO2 96 %. Body mass index is 22.29 kg/m.     11/09/2021  1:25 PM 11/09/2021    1:24 PM 10/25/2021    4:30 PM  Vitals with BMI  Height  _0    Weight  99 lbs 6 oz   BMI  32.2   Systolic 025 427 062  Diastolic 96 376 79  Pulse 65 62 114     Orthostatic VS for the past 72 hrs (Last 3 readings):  Patient Position BP Location Cuff Size  11/09/21 1325 Sitting Left Arm Normal  11/09/21 1324 Sitting Left Arm Normal    Physical Exam Vitals reviewed.  Constitutional:      Appearance: She is well-developed.  Neck:     Vascular: No carotid bruit or JVD.  Cardiovascular:     Rate and  Rhythm: Normal rate and regular rhythm. No extrasystoles are present.    Pulses:          Femoral pulses are 2+ on the right side with bruit.      Popliteal pulses are 1+ on the right side and 1+ on the left side.       Dorsalis pedis pulses are 1+ on the right side and 0 on the left side.       Posterior tibial pulses are 0 on the left side.     Heart sounds: Normal heart sounds.  Pulmonary:     Effort: Pulmonary effort is normal. No accessory muscle usage or respiratory distress.     Breath sounds: Rales (Occasional scattered) present. No wheezing.  Musculoskeletal:     Right lower leg: Edema (1-2+ pitting) present.     Left lower leg: Edema (1-2+ pitting) present.  Skin:    Capillary Refill: Capillary refill takes less than 2 seconds.     Findings: No erythema.  Neurological:     Comments: Patient is somnolent on exam and appears mildly confused     Radiology: No results found.  Laboratory examination:       Latest Ref Rng & Units 11/04/2021    2:47 PM 10/25/2021   12:15 PM 09/21/2021    5:29 AM  CMP  Glucose 70 - 99 mg/dL 107  106  103   BUN 8 - 27 mg/dL 27  37  32   Creatinine 0.57 - 1.00 mg/dL 1.37  2.72  1.08   Sodium 134 - 144 mmol/L 143  142  142   Potassium 3.5 - 5.2 mmol/L 3.8  3.6  4.6   Chloride 96 - 106 mmol/L 101  101  107   CO2 20 - 29 mmol/L _1 Calcium 8.7 - 10.3 mg/dL 11.4  10.8  10.3   Total Protein 6.5 - 8.1 g/dL  6.6    Total Bilirubin 0.3 - 1.2 mg/dL  0.7    Alkaline Phos 38 - 126 U/L  79    AST 15 - 41 U/L  31    ALT 0 - 44 U/L  19        Latest Ref Rng & Units 10/25/2021   12:15 PM 09/22/2021    5:29 AM 09/21/2021    6:21 PM  CBC  WBC 4.0 - 10.5 K/uL 3.7  3.0    Hemoglobin 12.0 - 15.0 g/dL 11.4  8.6  9.1   Hematocrit 36.0 - 46.0 % 35.6  27.0  28.6   Platelets 150 - 400 K/uL 184  121     Lipid Panel     Component Value Date/Time   CHOL 132 06/12/2019 1502   TRIG 62  06/12/2019 1502   HDL 73 06/12/2019 1502   CHOLHDL 1.8 06/12/2019  1502   LDLCALC 46 06/12/2019 1502   HEMOGLOBIN A1C Lab Results  Component Value Date   HGBA1C 6.1 (H) 10/28/2014   MPG 128 10/28/2014   TSH No results for input(s): "TSH" in the last 8760 hours.  External labs:  Labs 10/04/2021:  Hb 9.7/HCT 29.6, platelets 245, normal indicis.  BUN 28, creatinine 1.61, EGFR 33 mL, potassium 4.2.  LFTs normal.  Labs 03/03/2021:  Hb 12.3/HCT 36.7, platelets 216.  Normal indicis.  Serum glucose 82 mg, BUN 28, creatinine 1.33, EGFR 42 mL, potassium 4.1, LFTs normal.  Magnesium 2.0.  Lipid profile 07/28/2020:  Total cholesterol 149, triglycerides 74, HDL 78, LDL 57.   Current Outpatient Medications:    acetaminophen (TYLENOL) 325 MG tablet, Take 2 tablets (650 mg total) by mouth every 6 (six) hours as needed for mild pain (or Fever >/= 101)., Disp: 20 tablet, Rfl: 0   ALPRAZolam (XANAX) 1 MG tablet, Take 1 tablet (1 mg total) by mouth 2 (two) times daily as needed., Disp: 30 tablet, Rfl: 0   losartan-hydrochlorothiazide (HYZAAR) 100-25 MG tablet, Take 1 tablet by mouth every morning., Disp: 30 tablet, Rfl: 2   metoprolol succinate (TOPROL-XL) 50 MG 24 hr tablet, Take 1 tablet (50 mg total) by mouth daily. Take with or immediately following a meal., Disp: 30 tablet, Rfl: 2   oxyCODONE (OXYCONTIN) 60 MG 12 hr tablet, Take 1 tablet by mouth every 8 (eight) hours., Disp: , Rfl:    Vitamin D, Cholecalciferol, 25 MCG (1000 UT) TABS, Take 1 tablet by mouth daily., Disp: 90 tablet, Rfl: 3   atorvastatin (LIPITOR) 10 MG tablet, Take 1 tablet (10 mg total) by mouth daily., Disp: 90 tablet, Rfl: 3   cyanocobalamin 1000 MCG tablet, Take 1 tablet (1,000 mcg total) by mouth daily., Disp: 90 tablet, Rfl: 3   furosemide (LASIX) 20 MG tablet, Take 1 tablet (20 mg total) by mouth daily as needed for fluid or edema., Disp: 30 tablet, Rfl: 2   pantoprazole (PROTONIX) 40 MG tablet, Take 1 tablet (40 mg total) by mouth daily before breakfast., Disp: 90 tablet, Rfl: 1    potassium chloride (KLOR-CON) 10 MEQ tablet, Take 1 tablet (10 mEq total) by mouth daily as needed (With Furosemide). Take with lasix, Disp: 90 tablet, Rfl: 1   Cardiac Studies:   Lower Extremity Arterial Duplex 01/14/2019: There is monophasic waveform noted in the right external iliac and CFA suggests proximal significant disease. There is severe diffuse mixed plaque noted throughout the right lower extremity.  Moderate velocity increase at the left distal superficial femoral artery, >50% stenosis. There is severe diffuse mixed plaque throughout the left lower extremity This exam reveals moderately decreased perfusion of the right lower extremity, noted at the dorsalis pedis artery level (ABI 0.75) and moderately decreased perfusion of the left lower extremity, noted at the post tibial artery level (ABI 0.58).  No significant change from report of 01/11/2015.   Peripheral arteriogram 03/18/2019:  Dist R EIA to Prox R CFA lesion is 60% stenosed.  Prox R CIA to Mid R EIA lesion is 20% stenosed.  Prox L CIA to Mid L CFA lesion is 20% stenosed.  Prox L SFA to Dist L SFA lesion is 100% stenosed.  Three-vessel runoff below the knee on the left with brisk flow.   Failed attempt at angioplasty of the left SFA.    Suprarenal Abd AO to Infrarenal Abd AO  lesion is 30% stenosed. Severely calcified lesions.  Abdominal aortogram also reveals patent renal arteries 1 on either side. There is no evidence of abdominal aortic aneurysm.  PCV ECHOCARDIOGRAM COMPLETE 11/04/2021  Narrative Echocardiogram 11/04/2021: Left ventricle cavity is normal in size and wall thickness. Normal global wall motion. Normal LV systolic function with visual EF 50-55%. Doppler evidence of grade I (impaired) diastolic dysfunction, normal LAP. Left atrial cavity is moderately dilated. Structurally normal trileaflet aortic valve.  Trace aortic regurgitation. Mild to moderate mitral regurgitation. Mild to moderate tricuspid  regurgitation. Estimated pulmonary artery systolic pressure 38 mmHg. Compared to previous study in 2020, trace aortic regurgitation is new. No other significant change noted.   EKG:  EKG normal sinus rhythm at rate of 67 bpm with sinus arrhythmia.  Normal axis.  Right bundle branch block.  PACs. No significant change from EKG 11/16/2020.   Assessment     ICD-10-CM   1. Chronic diastolic CHF (congestive heart failure) (HCC)  I50.32 losartan-hydrochlorothiazide (HYZAAR) 100-25 MG tablet    metoprolol succinate (TOPROL-XL) 50 MG 24 hr tablet    furosemide (LASIX) 20 MG tablet    potassium chloride (KLOR-CON) 10 MEQ tablet    2. Chronic hypoxemic respiratory failure (HCC)  J96.11     3. PAD (peripheral artery disease) (HCC)  I73.9 atorvastatin (LIPITOR) 10 MG tablet    cyanocobalamin 1000 MCG tablet    4. Hypercholesteremia  E78.00 atorvastatin (LIPITOR) 10 MG tablet    5. Primary hypertension  I10 losartan-hydrochlorothiazide (HYZAAR) 100-25 MG tablet    metoprolol succinate (TOPROL-XL) 50 MG 24 hr tablet    Basic metabolic panel    6. Gastroesophageal reflux disease with esophagitis without hemorrhage  K21.00 pantoprazole (PROTONIX) 40 MG tablet       Meds ordered this encounter  Medications   losartan-hydrochlorothiazide (HYZAAR) 100-25 MG tablet    Sig: Take 1 tablet by mouth every morning.    Dispense:  30 tablet    Refill:  2   metoprolol succinate (TOPROL-XL) 50 MG 24 hr tablet    Sig: Take 1 tablet (50 mg total) by mouth daily. Take with or immediately following a meal.    Dispense:  30 tablet    Refill:  2   atorvastatin (LIPITOR) 10 MG tablet    Sig: Take 1 tablet (10 mg total) by mouth daily.    Dispense:  90 tablet    Refill:  3   furosemide (LASIX) 20 MG tablet    Sig: Take 1 tablet (20 mg total) by mouth daily as needed for fluid or edema.    Dispense:  30 tablet    Refill:  2   potassium chloride (KLOR-CON) 10 MEQ tablet    Sig: Take 1 tablet (10 mEq total)  by mouth daily as needed (With Furosemide). Take with lasix    Dispense:  90 tablet    Refill:  1    REFILL, OUT OF REFILL   pantoprazole (PROTONIX) 40 MG tablet    Sig: Take 1 tablet (40 mg total) by mouth daily before breakfast.    Dispense:  90 tablet    Refill:  1   cyanocobalamin 1000 MCG tablet    Sig: Take 1 tablet (1,000 mcg total) by mouth daily.    Dispense:  90 tablet    Refill:  3   Vitamin D, Cholecalciferol, 25 MCG (1000 UT) TABS    Sig: Take 1 tablet by mouth daily.    Dispense:  90 tablet  Refill:  3   Medications Discontinued During This Encounter  Medication Reason   albuterol (VENTOLIN HFA) 108 (90 Base) MCG/ACT inhaler    gabapentin (NEURONTIN) 100 MG capsule Patient Preference   gabapentin (NEURONTIN) 300 MG capsule Patient Preference   prochlorperazine (COMPAZINE) 10 MG tablet No longer needed (for PRN medications)   rOPINIRole (REQUIP) 1 MG tablet Patient Preference   buPROPion (WELLBUTRIN XL) 150 MG 24 hr tablet Patient Preference   atorvastatin (LIPITOR) 10 MG tablet Reorder   furosemide (LASIX) 20 MG tablet    potassium chloride (KLOR-CON) 10 MEQ tablet    pantoprazole (PROTONIX) 40 MG tablet    hydrOXYzine (ATARAX) 10 MG tablet Patient Preference   mirtazapine (REMERON) 30 MG tablet Patient Preference   Cholecalciferol 250 MCG (10000 UT) CAPS Entry Error   vitamin B-12 1000 MCG tablet Reorder   Multiple Vitamins-Minerals (MULTI FOR HER 50+) TABS Patient Preference    Orders Placed This Encounter  Procedures   Basic metabolic panel    Recommendations:   Holly S Bott  is a 76 y.o. with peripheral artery disease has known long left SFA CTO with failed attempt in September 2020 for revascularization, also has right calcific iliac artery stenosis which was felt to be high risk due to extension into right common femoral artery for dissection. She has right hip claudication and also left leg claudication that is chronic but has remained  stable.  Past medical history significant for tobacco use disorder, which she quit on 10/21/2019, but started smoking about 2 weeks ago 10/29/21, hypertension, COPD,Stage III chronic kidney disease, history of breast cancer, bilateral venous insufficiency and has had ablation and persistent superficial varicose veins with complications and recurrent varicose vein bleeding and h/o  UGI bleed on 09/18/2021 needing blood transfusion, EGD revealing nonbleeding gastric ulcer, acute gastritis and medium-sized hiatal hernia.    She was seen by [name] on 10/25/2021 and also on the same day in the neurology office and was recommended to go to the emergency room due to potential for anemia and altered mental status and hypoxemia.  In the ED she was found to have acute renal failure and diuretics were discontinued and discharged home.  She now presents for follow-up, renal function has returned to normal baseline stage IIIa chronic kidney disease.  However of note patient wanted to have control of all her medications herself as her friend Anderson Malta was controlling the medications, she has stopped taking all the medications except the pain medications and Xanax.  Her blood pressure is now markedly elevated.  She is also started to smoke.  Patient states that she is willing to take only the medications that I gave her and what her neurologist will give her with regard to pain and will start fresh.  She does not want to be on any other medications including antidepressants.  She also states that she is feeling the best after stopping all the medications.  I reviewed her chart extensively, I restarted her back on the losartan HCT 100/25 mg daily, metoprolol succinate 50 mg daily, Lipitor 10 mg daily, pantoprazole 40 mg daily in view of history of GI bleed and B12 and vitamin D.  Made her furosemide and potassium to be taken only on a as needed basis for worsening leg edema or sudden weight gain of 2 to 3 pounds.  I again discussed  with her regarding stopping smoking.  Gabapentin, Requip, Wellbutrin, Remeron were discontinued.  I would like to see him back in 1 month.  She may need repeat labs prior to her next office visit especially with regard to renal function.  Was a >40-minute office visit encounter.   Adrian Prows, MD, [facility] 11/10/2021, 7:47 AM Office: [phone] Fax: [phone] Pager: [phone]

## 2021-11-10 ENCOUNTER — Encounter: Payer: Self-pay | Admitting: Cardiology

## 2021-11-10 DIAGNOSIS — M47816 Spondylosis without myelopathy or radiculopathy, lumbar region: Secondary | ICD-10-CM | POA: Diagnosis not present

## 2021-11-10 DIAGNOSIS — M961 Postlaminectomy syndrome, not elsewhere classified: Secondary | ICD-10-CM | POA: Diagnosis not present

## 2021-11-10 DIAGNOSIS — G894 Chronic pain syndrome: Secondary | ICD-10-CM | POA: Diagnosis not present

## 2021-11-10 DIAGNOSIS — Z79891 Long term (current) use of opiate analgesic: Secondary | ICD-10-CM | POA: Diagnosis not present

## 2021-11-14 ENCOUNTER — Telehealth: Payer: Self-pay

## 2021-11-14 NOTE — Telephone Encounter (Signed)
Patient called and wants to discuss you being her POA. Is there a form you need to give her, or does she need to bring something in? Please advise.

## 2021-11-15 ENCOUNTER — Other Ambulatory Visit: Payer: Self-pay | Admitting: Cardiology

## 2021-11-15 DIAGNOSIS — I10 Essential (primary) hypertension: Secondary | ICD-10-CM

## 2021-11-16 NOTE — Telephone Encounter (Signed)
Seems like encounter was open in error so closing encounter.  

## 2021-11-21 DIAGNOSIS — J449 Chronic obstructive pulmonary disease, unspecified: Secondary | ICD-10-CM | POA: Diagnosis not present

## 2021-11-24 ENCOUNTER — Emergency Department (HOSPITAL_COMMUNITY): Payer: Medicare Other

## 2021-11-24 ENCOUNTER — Encounter (HOSPITAL_COMMUNITY): Payer: Self-pay

## 2021-11-24 ENCOUNTER — Other Ambulatory Visit: Payer: Self-pay | Admitting: Cardiology

## 2021-11-24 ENCOUNTER — Other Ambulatory Visit: Payer: Self-pay

## 2021-11-24 ENCOUNTER — Emergency Department (HOSPITAL_COMMUNITY)
Admission: EM | Admit: 2021-11-24 | Discharge: 2021-11-25 | Disposition: A | Discharge status: Home / Self Care | Payer: Medicare Other | Attending: Emergency Medicine | Admitting: Emergency Medicine

## 2021-11-24 DIAGNOSIS — W01198A Fall on same level from slipping, tripping and stumbling with subsequent striking against other object, initial encounter: Secondary | ICD-10-CM | POA: Diagnosis not present

## 2021-11-24 DIAGNOSIS — S0083XA Contusion of other part of head, initial encounter: Secondary | ICD-10-CM | POA: Diagnosis not present

## 2021-11-24 DIAGNOSIS — W19XXXA Unspecified fall, initial encounter: Secondary | ICD-10-CM

## 2021-11-24 DIAGNOSIS — I10 Essential (primary) hypertension: Secondary | ICD-10-CM

## 2021-11-24 DIAGNOSIS — S0003XA Contusion of scalp, initial encounter: Secondary | ICD-10-CM | POA: Diagnosis not present

## 2021-11-24 DIAGNOSIS — Z79899 Other long term (current) drug therapy: Secondary | ICD-10-CM | POA: Insufficient documentation

## 2021-11-24 DIAGNOSIS — S0990XA Unspecified injury of head, initial encounter: Secondary | ICD-10-CM | POA: Diagnosis present

## 2021-11-24 DIAGNOSIS — Y92009 Unspecified place in unspecified non-institutional (private) residence as the place of occurrence of the external cause: Secondary | ICD-10-CM | POA: Diagnosis not present

## 2021-11-24 DIAGNOSIS — M25551 Pain in right hip: Secondary | ICD-10-CM | POA: Diagnosis not present

## 2021-11-24 DIAGNOSIS — T1490XA Injury, unspecified, initial encounter: Secondary | ICD-10-CM | POA: Diagnosis not present

## 2021-11-24 DIAGNOSIS — R079 Chest pain, unspecified: Secondary | ICD-10-CM | POA: Diagnosis not present

## 2021-11-24 DIAGNOSIS — M25552 Pain in left hip: Secondary | ICD-10-CM | POA: Diagnosis not present

## 2021-11-24 DIAGNOSIS — M47812 Spondylosis without myelopathy or radiculopathy, cervical region: Secondary | ICD-10-CM | POA: Diagnosis not present

## 2021-11-24 DIAGNOSIS — R6889 Other general symptoms and signs: Secondary | ICD-10-CM | POA: Diagnosis not present

## 2021-11-24 DIAGNOSIS — Z743 Need for continuous supervision: Secondary | ICD-10-CM | POA: Diagnosis not present

## 2021-11-24 DIAGNOSIS — Z043 Encounter for examination and observation following other accident: Secondary | ICD-10-CM | POA: Diagnosis not present

## 2021-11-24 MED ORDER — ALPRAZOLAM 0.25 MG PO TABS
0.2500 mg | ORAL_TABLET | Freq: Once | ORAL | Status: AC
Start: 2021-11-24 — End: 2021-11-24
  Administered 2021-11-24: 0.25 mg via ORAL
  Filled 2021-11-24: qty 1

## 2021-11-24 NOTE — ED Triage Notes (Signed)
BIB [facility] s/p mechanical fall at home. No thinners,no LOC. Hematoma to posterior head. Hypertensive on EMS arrival, non-compliant with meds at home. On 3-4L chronically at home.

## 2021-11-25 DIAGNOSIS — W19XXXA Unspecified fall, initial encounter: Secondary | ICD-10-CM | POA: Diagnosis not present

## 2021-11-25 DIAGNOSIS — R6889 Other general symptoms and signs: Secondary | ICD-10-CM | POA: Diagnosis not present

## 2021-11-25 DIAGNOSIS — Z743 Need for continuous supervision: Secondary | ICD-10-CM | POA: Diagnosis not present

## 2021-11-25 MED ORDER — ACETAMINOPHEN 500 MG PO TABS: 1000.0000 mg | ORAL_TABLET | Freq: Once | ORAL | Status: DC

## 2021-11-25 MED ORDER — ACETAMINOPHEN 500 MG PO TABS
1000.0000 mg | ORAL_TABLET | Freq: Once | ORAL | Status: AC
  Administered 2021-11-25: 1000 mg via ORAL
  Filled 2021-11-25: qty 2

## 2021-11-25 MED ORDER — OXYBUTYNIN CHLORIDE 5 MG PO TABS: 5.0000 mg | ORAL_TABLET | Freq: Three times a day (TID) | ORAL | Status: DC

## 2021-11-25 NOTE — ED Provider Notes (Addendum)
[facility] DEPT Provider Note   CSN: [id number] Arrival date & time: 11/24/21  2243     History  Chief Complaint  Patient presents with   Fall    [name] is a 76 y.o. female.  The history is provided by the patient.  Fall This is a new problem. The current episode started less than 1 hour ago. The problem occurs constantly. The problem has not changed since onset.Pertinent negatives include no chest pain, no abdominal pain and no shortness of breath. Nothing aggravates the symptoms. Nothing relieves the symptoms. She has tried nothing for the symptoms. The treatment provided no relief.       Home Medications Prior to Admission medications   Medication Sig Start Date End Date Taking? Authorizing Provider  acetaminophen (TYLENOL) 325 MG tablet Take 2 tablets (650 mg total) by mouth every 6 (six) hours as needed for mild pain (or Fever >/= 101). 11/09/20   Sheikh, Omair Latif, DO  ALPRAZolam Duanne Moron) 1 MG tablet Take 1 tablet (1 mg total) by mouth 2 (two) times daily as needed. 11/09/20   Raiford Noble Latif, DO  atorvastatin (LIPITOR) 10 MG tablet Take 1 tablet (10 mg total) by mouth daily. 11/09/21   Adrian Prows, MD  cyanocobalamin 1000 MCG tablet Take 1 tablet (1,000 mcg total) by mouth daily. 11/09/21   Adrian Prows, MD  furosemide (LASIX) 20 MG tablet Take 1 tablet (20 mg total) by mouth daily as needed for fluid or edema. 11/09/21   Adrian Prows, MD  losartan-hydrochlorothiazide [facility]) 100-25 MG tablet Take 1 tablet by mouth every morning. 11/09/21   Adrian Prows, MD  metoprolol succinate (TOPROL-XL) 50 MG 24 hr tablet Take 1 tablet (50 mg total) by mouth daily. Take with or immediately following a meal. 11/09/21 02/07/22  Adrian Prows, MD  oxyCODONE (OXYCONTIN) 60 MG 12 hr tablet Take 1 tablet by mouth every 8 (eight) hours.    [provider]  pantoprazole (PROTONIX) 40 MG tablet Take 1 tablet (40 mg total) by mouth daily before breakfast.  11/09/21 05/08/22  Adrian Prows, MD  potassium chloride (KLOR-CON) 10 MEQ tablet Take 1 tablet (10 mEq total) by mouth daily as needed (With Furosemide). Take with lasix 11/09/21   Adrian Prows, MD  Vitamin D, Cholecalciferol, 25 MCG (1000 UT) TABS Take 1 tablet by mouth daily. 11/09/21   Adrian Prows, MD      Allergies    Patient has no known allergies.    Review of Systems   Review of Systems  Constitutional:  Negative for fever.  HENT:  Negative for facial swelling.   Eyes:  Negative for redness.  Respiratory:  Negative for shortness of breath.   Cardiovascular:  Negative for chest pain.  Gastrointestinal:  Negative for abdominal pain.  Genitourinary:  Negative for flank pain.  Musculoskeletal:  Negative for arthralgias.  Neurological:  Negative for facial asymmetry.  All other systems reviewed and are negative.   Physical Exam Updated Vital Signs BP (!) 164/68   Pulse 60   Temp 98 F (36.7 C)   Resp 18   Ht '4\' 8"'$  (1.422 m)   Wt 45.1 kg   SpO2 100%   BMI 22.29 kg/m  Physical Exam Vitals and nursing note reviewed.  Constitutional:      General: She is not in acute distress.    Appearance: She is well-developed.  HENT:     Head: Normocephalic and atraumatic.     Nose: Nose normal.  Mouth/Throat:     Mouth: Mucous membranes are moist.     Pharynx: Oropharynx is clear.  Eyes:     Pupils: Pupils are equal, round, and reactive to light.  Cardiovascular:     Rate and Rhythm: Normal rate and regular rhythm.     Pulses: Normal pulses.     Heart sounds: Normal heart sounds.  Pulmonary:     Effort: Pulmonary effort is normal. No respiratory distress.     Breath sounds: Normal breath sounds.  Abdominal:     General: Abdomen is flat. Bowel sounds are normal. There is no distension.     Palpations: Abdomen is soft.     Tenderness: There is no abdominal tenderness. There is no guarding or rebound.  Genitourinary:    Vagina: No vaginal discharge.  Musculoskeletal:         General: Normal range of motion.     Cervical back: Normal range of motion and neck supple.  Skin:    General: Skin is warm and dry.     Capillary Refill: Capillary refill takes less than 2 seconds.     Findings: No erythema or rash.  Neurological:     General: No focal deficit present.     Deep Tendon Reflexes: Reflexes normal.  Psychiatric:        Mood and Affect: Mood normal.        Behavior: Behavior normal.     ED Results / Procedures / Treatments   Labs (all labs ordered are listed, but only abnormal results are displayed) Labs Reviewed - No data to display  EKG None  Radiology DG Chest 1 View  Result Date: 11/24/2021 CLINICAL DATA:  Recent falls with chest pain, initial encounter EXAM: CHEST  1 VIEW COMPARISON:  10/25/2021 FINDINGS: Cardiac shadow is within normal limits. The lungs are well aerated bilaterally. No focal infiltrate or effusion is seen. Old rib fractures are noted on the right. Postsurgical changes are noted in the right chest wall. Bilateral shoulder replacements are noted as well as fusion changes in the lumbar spine. IMPRESSION: No acute abnormality noted. Electronically Signed   By: Inez Catalina M.D.   On: 11/24/2021 23:59   DG Hips Bilat W or Wo Pelvis 3-4 Views  Result Date: 11/24/2021 CLINICAL DATA:  Fall this evening with bilateral hip and pelvic pain. EXAM: DG HIP (WITH OR WITHOUT PELVIS) 3-4V BILAT COMPARISON:  None Available. FINDINGS: The bones are diffusely under mineralized limiting assessment. Along the this, no evidence of acute fracture. Both femoral heads are seated in the respective acetabula. The pubic rami appear intact. Pubic symphysis and sacroiliac joints are congruent allowing for mild rotation. IMPRESSION: Osteopenia/osteoporosis without acute fracture. Electronically Signed   By: Keith Rake M.D.   On: 11/24/2021 23:56   CT Cervical Spine Wo Contrast  Result Date: 11/24/2021 CLINICAL DATA:  Mechanical fall at home. EXAM: CT CERVICAL  SPINE WITHOUT CONTRAST TECHNIQUE: Multidetector CT imaging of the cervical spine was performed without intravenous contrast. Multiplanar CT image reconstructions were also generated. RADIATION DOSE REDUCTION: This exam was performed according to the departmental dose-optimization program which includes automated exposure control, adjustment of the mA and/or kV according to patient size and/or use of iterative reconstruction technique. COMPARISON:  CT 08/21/2014 FINDINGS: Alignment: No traumatic subluxation. Grade 1 anterolisthesis of C4 on C5 is chronic and likely degenerative. Skull base and vertebrae: No acute fracture. Vertebral body heights are maintained. The dens and skull base are intact. Soft tissues and spinal canal: No  prevertebral fluid or swelling. No visible canal hematoma. Disc levels: Degenerative disc disease from C4-C5 through C6-C7. Moderate diffuse facet hypertrophy, most prominently affecting C4-C5 on the right. Upper chest: Negative. Other: Carotid calcifications. IMPRESSION: Degenerative change in the cervical spine without acute fracture or subluxation. Electronically Signed   By: Keith Rake M.D.   On: 11/24/2021 23:42   CT Head Wo Contrast  Result Date: 11/24/2021 CLINICAL DATA:  Mechanical fall at home. EXAM: CT HEAD WITHOUT CONTRAST TECHNIQUE: Contiguous axial images were obtained from the base of the skull through the vertex without intravenous contrast. RADIATION DOSE REDUCTION: This exam was performed according to the departmental dose-optimization program which includes automated exposure control, adjustment of the mA and/or kV according to patient size and/or use of iterative reconstruction technique. COMPARISON:  Head CT 06/20/2019 FINDINGS: Brain: No intracranial hemorrhage, mass effect, or midline shift. Stable degree of generalized atrophy. No hydrocephalus. The basilar cisterns are patent. Moderate periventricular and deep white matter hypodensity typical of chronic small  vessel ischemia. There are remote lacunar infarcts in the basal ganglia and right caudate. No evidence of territorial infarct or acute ischemia. No extra-axial or intracranial fluid collection. Vascular: Atherosclerosis of skullbase vasculature without hyperdense vessel or abnormal calcification. Skull: Normal. Negative for fracture or focal lesion. Sinuses/Orbits: Paranasal sinuses and mastoid air cells are clear. The visualized orbits are unremarkable. Other: Small left parietal scalp hematoma. IMPRESSION: 1. Small left parietal scalp hematoma. No acute intracranial abnormality. No skull fracture. 2. Stable atrophy and chronic small vessel ischemia. Electronically Signed   By: Keith Rake M.D.   On: 11/24/2021 23:36    Procedures Procedures    Medications Ordered in ED Medications  ALPRAZolam Duanne Moron) tablet 0.25 mg (0.25 mg Oral Given 11/24/21 2351)  acetaminophen (TYLENOL) tablet 1,000 mg (1,000 mg Oral Given 11/25/21 0033)    ED Course/ Medical Decision Making/ A&P                           Medical Decision Making Fall tripped over walker and fell and hit head   Amount and/or Complexity of Data Reviewed External Data Reviewed: notes.    Details: previous notes reviewed Radiology: ordered and independent interpretation performed.    Details: no fracture by me on CT and Xray  Risk OTC drugs. Prescription drug management. Risk Details: No fractures on Xray.  Close follow     Final Clinical Impression(s) / ED Diagnoses Final diagnoses:  Fall, initial encounter   Return for intractable cough, coughing up blood, fevers > 100.4 unrelieved by medication, shortness of breath, intractable vomiting, chest pain, shortness of breath, weakness, numbness, changes in speech, facial asymmetry, abdominal pain, passing out, Inability to tolerate liquids or food, cough, altered mental status or any concerns. No signs of systemic illness or infection. The patient is nontoxic-appearing on exam and  vital signs are within normal limits.  I have reviewed the triage vital signs and the nursing notes. Pertinent labs & imaging results that were available during my care of the patient were reviewed by me and considered in my medical decision making (see chart for details). After history, exam, and medical workup I feel the patient has been appropriately medically screened and is safe for discharge home. Pertinent diagnoses were discussed with the patient. Patient was given return precautions.  Rx / DC Orders ED Discharge Orders     None        Flynt Breeze, MD 11/25/21 1448

## 2021-12-05 ENCOUNTER — Ambulatory Visit: Payer: Medicare Other | Admitting: Cardiology

## 2021-12-08 DIAGNOSIS — G894 Chronic pain syndrome: Secondary | ICD-10-CM | POA: Diagnosis not present

## 2021-12-08 DIAGNOSIS — Z79891 Long term (current) use of opiate analgesic: Secondary | ICD-10-CM | POA: Diagnosis not present

## 2021-12-08 DIAGNOSIS — M961 Postlaminectomy syndrome, not elsewhere classified: Secondary | ICD-10-CM | POA: Diagnosis not present

## 2021-12-08 DIAGNOSIS — M47816 Spondylosis without myelopathy or radiculopathy, lumbar region: Secondary | ICD-10-CM | POA: Diagnosis not present

## 2021-12-11 ENCOUNTER — Other Ambulatory Visit: Payer: Self-pay | Admitting: Cardiology

## 2021-12-11 DIAGNOSIS — I10 Essential (primary) hypertension: Secondary | ICD-10-CM

## 2021-12-15 ENCOUNTER — Other Ambulatory Visit: Payer: Self-pay | Admitting: Cardiology

## 2021-12-15 DIAGNOSIS — I10 Essential (primary) hypertension: Secondary | ICD-10-CM

## 2021-12-22 ENCOUNTER — Encounter: Payer: Self-pay | Admitting: Hematology

## 2021-12-22 ENCOUNTER — Ambulatory Visit: Payer: Medicare (Managed Care) | Admitting: Cardiology

## 2021-12-22 ENCOUNTER — Encounter: Payer: Self-pay | Admitting: Cardiology

## 2021-12-22 VITALS — BP 157/90 | HR 65 | Resp 16 | Ht <= 58 in | Wt 98.0 lb

## 2021-12-22 DIAGNOSIS — I739 Peripheral vascular disease, unspecified: Secondary | ICD-10-CM

## 2021-12-22 DIAGNOSIS — I5032 Chronic diastolic (congestive) heart failure: Secondary | ICD-10-CM

## 2021-12-22 DIAGNOSIS — J432 Centrilobular emphysema: Secondary | ICD-10-CM

## 2021-12-22 DIAGNOSIS — I10 Essential (primary) hypertension: Secondary | ICD-10-CM

## 2021-12-22 DIAGNOSIS — J9611 Chronic respiratory failure with hypoxia: Secondary | ICD-10-CM

## 2021-12-22 MED ORDER — METOPROLOL SUCCINATE ER 50 MG PO TB24: 50.0000 mg | ORAL_TABLET | Freq: Every day | ORAL | 3 refills | Status: DC

## 2021-12-22 MED ORDER — LOSARTAN POTASSIUM 25 MG PO TABS: 25.0000 mg | ORAL_TABLET | Freq: Every evening | ORAL | 3 refills | Status: DC

## 2021-12-22 NOTE — Patient Instructions (Signed)
Please get blood work done in 2 to 3 weeks at [facility].  I will see you back here in the office in 4 to 5 weeks.  Your medication list has been updated.

## 2021-12-22 NOTE — Progress Notes (Signed)
Primary Physician/Referring:  Almedia Balls, NP  Patient ID: [name], female    DOB: 01-18-1946, 76 y.o.   MRN: [id number]  Chief Complaint  Patient presents with   Hypertension   Congestive Heart Failure   Follow-up    6 weeks    HPI: Holly S Cullers  is a 76 y.o. female  with peripheral artery disease has known long left SFA CTO with failed attempt in September 2020 for revascularization, also has right calcific iliac artery stenosis which was felt to be high risk due to extension into right common femoral artery for dissection. She has right hip claudication and also left leg claudication that is chronic but has remained stable.  Past medical history significant for tobacco use disorder, which she quit on 10/21/2019, but started smoking about 2 weeks ago 10/29/21, hypertension, COPD,Stage III chronic kidney disease, history of breast cancer, bilateral venous insufficiency and has had ablation and persistent superficial varicose veins with complications and recurrent varicose vein bleeding and h/o  UGI bleed on 09/18/2021 needing blood transfusion, EGD revealing nonbleeding gastric ulcer, acute gastritis and medium-sized hiatal hernia.    Patient presents for 1 month follow-up visit as it was extremely difficult to reconcile her medications.  She has stopped trusting anybody except me, she is asking and begging me to be her power of attorney for health.  She does not want to go to nursing home or get enrolled in pace program, her daughter had signed her up for this program without her consent.  She was extremely emotional today.  Otherwise except for chronic dyspnea she has no other specific complaints and denied any claudication, chest pain.  Dyspnea is stable and cough is stable.   Past Medical History:  Diagnosis Date   Acute renal failure superimposed on stage 3 chronic kidney disease ([place]) 10/27/2014   Back pain, chronic    Breast cancer ([place]) 09/09/2010   Cancer of colon  ([place]) 05/24/2011   Centrilobular emphysema ([place]) 07/15/2018   CKD (chronic kidney disease), stage III (HCC)    Claudication, intermittent ([place]) 07/15/2018   Depression    GIB (gastrointestinal bleeding)    H/O tobacco use, presenting hazards to health Quit Dec 2019 05/03/2011   50 years, up to 3PPD quit last year.    Hx of varicose veins of lower extremity 07/15/2018   Hyperlipidemia    Hypertension    SDH (subdural hematoma) (HCC)    SOB (shortness of breath) 07/15/2018    Social History   Tobacco Use   Smoking status: Former    Packs/day: 0.25    Years: 30.00    Total pack years: 7.50    Types: Cigarettes    Quit date: 10/31/2019    Years since quitting: 2.1   Smokeless tobacco: Never   Tobacco comments:    had 6 cigarettes recently 03-10-20  Substance Use Topics   Alcohol use: Yes    Comment: rarely   Marital Status: Divorced   Review of Systems  Constitutional: Positive for malaise/fatigue.  Cardiovascular:  Positive for claudication (very mild) and dyspnea on exertion. Negative for chest pain, leg swelling and orthopnea.   Objective  Blood pressure (!) 157/90, pulse 65, resp. rate 16, height '4\' 8"'  (1.422 m), weight 98 lb (44.5 kg), SpO2 98 %. Body mass index is 21.97 kg/m.     12/22/2021    2:37 PM 11/25/2021    1:45 AM 11/25/2021    1:30 AM  Vitals with BMI  Height 4'  8"    Weight 98 lbs    BMI 81.27    Systolic 517 001 749  Diastolic 90 70 64  Pulse 65 81 59     Orthostatic VS for the past 72 hrs (Last 3 readings):  Patient Position BP Location Cuff Size  12/22/21 1437 Sitting Left Arm Small    Physical Exam Vitals reviewed.  Constitutional:      Appearance: She is well-developed.  Neck:     Vascular: No carotid bruit or JVD.  Cardiovascular:     Rate and Rhythm: Normal rate and regular rhythm. No extrasystoles are present.    Pulses:          Femoral pulses are 2+ on the right side with bruit.      Popliteal pulses are 1+ on the right side and 1+  on the left side.       Dorsalis pedis pulses are 1+ on the right side and 0 on the left side.       Posterior tibial pulses are 0 on the left side.     Heart sounds: Normal heart sounds.  Pulmonary:     Effort: Pulmonary effort is normal. No accessory muscle usage or respiratory distress.     Breath sounds: Rales (Occasional scattered) present. No wheezing.  Musculoskeletal:     Right lower leg: Edema (1-2+ pitting) present.     Left lower leg: Edema (1-2+ pitting) present.  Skin:    Capillary Refill: Capillary refill takes less than 2 seconds.     Findings: No erythema.  Neurological:     Comments: Patient is somnolent on exam and appears mildly confused     Radiology: No results found.  Laboratory examination:       Latest Ref Rng & Units 11/04/2021    2:47 PM 10/25/2021   12:15 PM 09/21/2021    5:29 AM  CMP  Glucose 70 - 99 mg/dL 107  106  103   BUN 8 - 27 mg/dL 27  37  32   Creatinine 0.57 - 1.00 mg/dL 1.37  2.72  1.08   Sodium 134 - 144 mmol/L 143  142  142   Potassium 3.5 - 5.2 mmol/L 3.8  3.6  4.6   Chloride 96 - 106 mmol/L 101  101  107   CO2 20 - 29 mmol/L '26  29  30   ' Calcium 8.7 - 10.3 mg/dL 11.4  10.8  10.3   Total Protein 6.5 - 8.1 g/dL  6.6    Total Bilirubin 0.3 - 1.2 mg/dL  0.7    Alkaline Phos 38 - 126 U/L  79    AST 15 - 41 U/L  31    ALT 0 - 44 U/L  19        Latest Ref Rng & Units 10/25/2021   12:15 PM 09/22/2021    5:29 AM 09/21/2021    6:21 PM  CBC  WBC 4.0 - 10.5 K/uL 3.7  3.0    Hemoglobin 12.0 - 15.0 g/dL 11.4  8.6  9.1   Hematocrit 36.0 - 46.0 % 35.6  27.0  28.6   Platelets 150 - 400 K/uL 184  121     Lipid Panel     Component Value Date/Time   CHOL 132 06/12/2019 1502   TRIG 62 06/12/2019 1502   HDL 73 06/12/2019 1502   CHOLHDL 1.8 06/12/2019 1502   LDLCALC 46 06/12/2019 1502   HEMOGLOBIN A1C Lab Results  Component Value Date  HGBA1C 6.1 (H) 10/28/2014   MPG 128 10/28/2014   TSH No results for input(s): "TSH" in the last 8760  hours.  External labs:  Labs 10/04/2021:  Hb 9.7/HCT 29.6, platelets 245, normal indicis.  BUN 28, creatinine 1.61, EGFR 33 mL, potassium 4.2.  LFTs normal.  Labs 03/03/2021:  Hb 12.3/HCT 36.7, platelets 216.  Normal indicis.  Serum glucose 82 mg, BUN 28, creatinine 1.33, EGFR 42 mL, potassium 4.1, LFTs normal.  Magnesium 2.0.  Lipid profile 07/28/2020:  Total cholesterol 149, triglycerides 74, HDL 78, LDL 57.   Current Outpatient Medications:    acetaminophen (TYLENOL) 325 MG tablet, Take 2 tablets (650 mg total) by mouth every 6 (six) hours as needed for mild pain (or Fever >/= 101)., Disp: 20 tablet, Rfl: 0   ALPRAZolam (XANAX) 1 MG tablet, Take 1 tablet (1 mg total) by mouth 2 (two) times daily as needed., Disp: 30 tablet, Rfl: 0   amLODipine (NORVASC) 5 MG tablet, Take 5 mg by mouth daily., Disp: , Rfl:    atorvastatin (LIPITOR) 10 MG tablet, Take 1 tablet (10 mg total) by mouth daily., Disp: 90 tablet, Rfl: 3   buPROPion (ZYBAN) 150 MG 12 hr tablet, Take 150 mg by mouth 2 (two) times daily., Disp: , Rfl:    cyanocobalamin 1000 MCG tablet, Take 1 tablet (1,000 mcg total) by mouth daily., Disp: 90 tablet, Rfl: 3   furosemide (LASIX) 20 MG tablet, Take 1 tablet (20 mg total) by mouth daily as needed for fluid or edema., Disp: 30 tablet, Rfl: 2   losartan (COZAAR) 25 MG tablet, Take 1 tablet (25 mg total) by mouth every evening., Disp: 90 tablet, Rfl: 3   oxyCODONE (OXYCONTIN) 60 MG 12 hr tablet, Take 1 tablet by mouth every 8 (eight) hours., Disp: , Rfl:    potassium chloride (KLOR-CON) 10 MEQ tablet, Take 1 tablet (10 mEq total) by mouth daily as needed (With Furosemide). Take with lasix, Disp: 90 tablet, Rfl: 1   rOPINIRole (REQUIP) 1 MG tablet, Take 1 mg by mouth 3 (three) times daily., Disp: , Rfl:    Vitamin D, Cholecalciferol, 25 MCG (1000 UT) TABS, Take 1 tablet by mouth daily., Disp: 90 tablet, Rfl: 3   metoprolol succinate (TOPROL-XL) 50 MG 24 hr tablet, Take 1 tablet (50  mg total) by mouth daily. Take with or immediately following a meal., Disp: 90 tablet, Rfl: 3   Cardiac Studies:   Lower Extremity Arterial Duplex 01/14/2019: There is monophasic waveform noted in the right external iliac and CFA suggests proximal significant disease. There is severe diffuse mixed plaque noted throughout the right lower extremity.  Moderate velocity increase at the left distal superficial femoral artery, >50% stenosis. There is severe diffuse mixed plaque throughout the left lower extremity This exam reveals moderately decreased perfusion of the right lower extremity, noted at the dorsalis pedis artery level (ABI 0.75) and moderately decreased perfusion of the left lower extremity, noted at the post tibial artery level (ABI 0.58).  No significant change from report of 01/11/2015.   Peripheral arteriogram 03/18/2019:  Dist R EIA to Prox R CFA lesion is 60% stenosed.  Prox R CIA to Mid R EIA lesion is 20% stenosed.  Prox L CIA to Mid L CFA lesion is 20% stenosed.  Prox L SFA to Dist L SFA lesion is 100% stenosed.  Three-vessel runoff below the knee on the left with brisk flow.   Failed attempt at angioplasty of the left SFA.  Suprarenal Abd AO to Infrarenal Abd AO lesion is 30% stenosed. Severely calcified lesions.  Abdominal aortogram also reveals patent renal arteries 1 on either side. There is no evidence of abdominal aortic aneurysm.  PCV ECHOCARDIOGRAM COMPLETE 11/04/2021  Narrative Echocardiogram 11/04/2021: Left ventricle cavity is normal in size and wall thickness. Normal global wall motion. Normal LV systolic function with visual EF 50-55%. Doppler evidence of grade I (impaired) diastolic dysfunction, normal LAP. Left atrial cavity is moderately dilated. Structurally normal trileaflet aortic valve.  Trace aortic regurgitation. Mild to moderate mitral regurgitation. Mild to moderate tricuspid regurgitation. Estimated pulmonary artery systolic pressure 38  mmHg. Compared to previous study in 2020, trace aortic regurgitation is new. No other significant change noted.   EKG:  EKG normal sinus rhythm at rate of 67 bpm with sinus arrhythmia.  Normal axis.  Right bundle branch block.  PACs. No significant change from EKG 11/16/2020.   Assessment     ICD-10-CM   1. Chronic diastolic CHF (congestive heart failure) (HCC)  I50.32 EKG 12-Lead    2. Chronic hypoxemic respiratory failure (HCC)  J96.11     3. Centrilobular emphysema ([place])  J43.2     4. PAD (peripheral artery disease) (HCC)  I73.9     5. Primary hypertension  I10 metoprolol succinate (TOPROL-XL) 50 MG 24 hr tablet    losartan (COZAAR) 25 MG tablet    Basic metabolic panel       Meds ordered this encounter  Medications   metoprolol succinate (TOPROL-XL) 50 MG 24 hr tablet    Sig: Take 1 tablet (50 mg total) by mouth daily. Take with or immediately following a meal.    Dispense:  90 tablet    Refill:  3   losartan (COZAAR) 25 MG tablet    Sig: Take 1 tablet (25 mg total) by mouth every evening.    Dispense:  90 tablet    Refill:  3   Medications Discontinued During This Encounter  Medication Reason   pantoprazole (PROTONIX) 40 MG tablet    losartan-hydrochlorothiazide (HYZAAR) 100-25 MG tablet Change in therapy   losartan-hydrochlorothiazide (HYZAAR) 100-12.5 MG tablet Side effect (s)   metoprolol succinate (TOPROL-XL) 50 MG 24 hr tablet Reorder    Orders Placed This Encounter  Procedures   Basic metabolic panel   EKG 55-DDUK    Recommendations:   Holly S Loscalzo  is a 76 y.o. with peripheral artery disease has known long left SFA CTO with failed attempt in September 2020 for revascularization, also has right calcific iliac artery stenosis which was felt to be high risk due to extension into right common femoral artery for dissection. She has right hip claudication and also left leg claudication that is chronic but has remained stable.  Past medical history  significant for tobacco use disorder, which she quit on 10/21/2019, but started smoking about 2 weeks ago 10/29/21, hypertension, COPD,Stage III chronic kidney disease, history of breast cancer, bilateral venous insufficiency and has had ablation and persistent superficial varicose veins with complications and recurrent varicose vein bleeding and h/o  UGI bleed on 09/18/2021 needing blood transfusion, EGD revealing nonbleeding gastric ulcer, acute gastritis and medium-sized hiatal hernia.    Patient presents for 1 month follow-up visit as it was extremely difficult to reconcile her medications.  She has stopped testing anybody except me, she is asking and begging me to be her power of attorney for health which are accepted.  I reviewed all her medications, reconciled her medications, advised her about  the medication that she needs to be on, blood pressure is markedly elevated.  This is because she has not been taking beta-blockers or ARB.  Low-dose losartan started today in view of acute renal failure on high-dose.  I will restart metoprolol succinate as well.  I have sorted out her medications and advised her which medications not to take.  She is concerned about her daughter putting her in "pace program" and states that she does not want to leave her home until she is completely disabled or not able to take care of herself.  Extremely difficult situation with regard to social issues, I did offer to speak to her daughter or her son.  I would like to see her back in 1 month for follow-up and review of extensive medication reconciliation and follow-up of hypertension, hypoxemia, peripheral arterial disease.  We discussed regarding CODE STATUS on her next office visit.   Adrian Prows, MD, [facility] 12/22/2021, 7:07 PM Office: [phone] Fax: [phone] Pager: [phone]

## 2022-01-06 ENCOUNTER — Telehealth: Payer: Self-pay

## 2022-01-06 NOTE — Telephone Encounter (Signed)
VM 1110 : Patient called and left a message.   Message is as follows:  "hi this is [name], a patient of Dr Einar Gip, if you would so be so kind as return my call I have an issue that I need to address with Dr Nadyne Coombes. if I could. get him to call me or for you to call me back as soon as possible it's very important thank you so much my number is [phone] thank you waiting for your call bye bye."  I called patient back, NA, LMAM.

## 2022-01-17 NOTE — Telephone Encounter (Signed)
LVM for patient to return call. 

## 2022-01-20 ENCOUNTER — Telehealth: Payer: Self-pay

## 2022-01-20 NOTE — Telephone Encounter (Signed)
VM 1110: Patient called and left a message.  Message is as follows:  [name], I have a problem with my medications and I need to speak to someone. Please call me back as soon as possible.   I called patient back, she said that she was dropped by her health insurance and now has a new insurance. Patient is wanting a refill on Oxycotin and doesn't know what medications to take. She said that she will be here Wednesday morning if she doesn't get a call.   Please call patient on Tuesday to schedule an appointment to see JG.

## 2022-01-24 ENCOUNTER — Encounter: Payer: Self-pay | Admitting: Hematology

## 2022-01-31 ENCOUNTER — Encounter: Payer: Self-pay | Admitting: Internal Medicine

## 2022-01-31 ENCOUNTER — Ambulatory Visit (INDEPENDENT_AMBULATORY_CARE_PROVIDER_SITE_OTHER): Payer: Medicare (Managed Care) | Admitting: Internal Medicine

## 2022-01-31 VITALS — BP 136/68 | HR 59 | Temp 97.9°F | Ht <= 58 in | Wt 104.0 lb

## 2022-01-31 DIAGNOSIS — J9611 Chronic respiratory failure with hypoxia: Secondary | ICD-10-CM

## 2022-01-31 DIAGNOSIS — J449 Chronic obstructive pulmonary disease, unspecified: Secondary | ICD-10-CM | POA: Diagnosis not present

## 2022-01-31 NOTE — Progress Notes (Signed)
Holly S Montanari    [id number]    Oct 02, 1945  Primary Care Physician:Hester, Jerene Pitch, NP Date of Appointment: 01/31/2022 Established Patient Visit  Chief complaint:   Chief Complaint  Patient presents with   COPD     HPI: [name] is a 76 y.o. woman with emphysema on [facility] continuously who presents with shortness of breath.  Interval Updates: Here for COPD follow up. No interval hospitalizations for copd or exacerbations.  Still not on inhaler therapy.  Breathing is stable, no change.  Anxiety is an issue. Wondering if she can have medication for this and other symptoms which are related to her emphysema.   Her friend could not come with her today who usually comes with her.   I have reviewed the patient's family social and past medical history and updated as appropriate.   Past Medical History:  Diagnosis Date   Acute renal failure superimposed on stage 3 chronic kidney disease ([place]) 10/27/2014   Back pain, chronic    Breast cancer ([place]) 09/09/2010   Cancer of colon ([place]) 05/24/2011   Centrilobular emphysema ([place]) 07/15/2018   CKD (chronic kidney disease), stage III (HCC)    Claudication, intermittent ([place]) 07/15/2018   Depression    GIB (gastrointestinal bleeding)    H/O tobacco use, presenting hazards to health Quit Dec 2019 05/03/2011   50 years, up to 3PPD quit last year.    Hx of varicose veins of lower extremity 07/15/2018   Hyperlipidemia    Hypertension    SDH (subdural hematoma) (HCC)    SOB (shortness of breath) 07/15/2018    Past Surgical History:  Procedure Laterality Date   ABDOMINAL AORTOGRAM W/LOWER EXTREMITY Left 03/18/2019   Procedure: ABDOMINAL AORTOGRAM W/LOWER EXTREMITY;  Surgeon: Adrian Prows, MD;  Location: [place] [facility];  Service: Cardiovascular;  Laterality: Left;   BACK SURGERY     BIOPSY  09/20/2021   Procedure: BIOPSY;  Surgeon: Wilford Corner, MD;  Location: [facility];  Service: Gastroenterology;;   BREAST  LUMPECTOMY Right 10/10/2010   COLON SURGERY  04/2011   COLONOSCOPY  05/03/2011   Procedure: COLONOSCOPY;  Surgeon: Jeryl Columbia, MD;  Location: [facility] ENDOSCOPY;  Service: Endoscopy;  Laterality: N/A;   ESOPHAGOGASTRODUODENOSCOPY (EGD) WITH PROPOFOL N/A 09/20/2021   Procedure: ESOPHAGOGASTRODUODENOSCOPY (EGD) WITH PROPOFOL;  Surgeon: Wilford Corner, MD;  Location: [facility]OS[facility];  Service: Gastroenterology;  Laterality: N/A;   JOINT REPLACEMENT     R&L total shoulder replacements   LUMBAR DISC SURGERY     PARTIAL HYSTERECTOMY     PERIPHERAL VASCULAR INTERVENTION  03/18/2019   Procedure: PERIPHERAL VASCULAR INTERVENTION;  Surgeon: Adrian Prows, MD;  Location: [place];  Service: Cardiovascular;;  attempted left SFA   SHOULDER SURGERY     TUMOR REMOVAL  Tumor removed R breast    Family History  Problem Relation Age of Onset   Diabetes Mother    Hypertension Mother    Cancer Mother        leukemia   Sudden death Mother    Heart attack Mother    Anesthesia problems Neg Hx    Hypotension Neg Hx    Malignant hyperthermia Neg Hx    Pseudochol deficiency Neg Hx    Breast cancer Neg Hx    Lung disease Neg Hx     Social History   Occupational History   Not on file  Tobacco Use   Smoking status: Former    Packs/day: 0.25  Years: 30.00    Total pack years: 7.50    Types: Cigarettes    Quit date: 10/31/2019    Years since quitting: 2.2   Smokeless tobacco: Never   Tobacco comments:    had 6 cigarettes recently 03-10-20  Vaping Use   Vaping Use: Never used  Substance and Sexual Activity   Alcohol use: Yes    Comment: rarely    Drug use: No   Sexual activity: Not Currently     Physical Exam: Blood pressure 136/68, pulse (!) 59, temperature 97.9 F (36.6 C), temperature source Oral, height '4\' 8"'$  (1.422 m), weight 104 lb (47.2 kg), SpO2 91 %.  Gen:      NAD. Chronically ill. On oxygen. Lungs:    severe kyphoscoliosis CV:         RRR no mrg MSK: bilateral venous  stasischanges  Data Reviewed: Imaging: I have personally reviewed the chest xrays June 2022 chronic interstitial prominence, no acute process  PFTs:      Latest Ref Rng & Units 04/08/2020    2:50 PM  PFT Results  FVC-Pre L 1.37   FVC-Predicted Pre % 56   FVC-Post L 1.70   FVC-Predicted Post % 70   Pre FEV1/FVC % % 70   Post FEV1/FCV % % 57   FEV1-Pre L 0.96   FEV1-Predicted Pre % 53   FEV1-Post L 0.96   DLCO uncorrected ml/min/mmHg 9.26   DLCO UNC% % 55   DLCO corrected ml/min/mmHg 9.26   DLCO COR %Predicted % 55   DLVA Predicted % 65   TLC L 4.47   TLC % Predicted % 100   RV % Predicted % 121    I have personally reviewed the patient's PFTs and they show moderately severe airflow limitation with a positive bronchodilator response. There is reduced diffusion capacity as well.   Labs:  Immunization status: Immunization History  Administered Date(s) Administered   PFIZER(Purple Top)SARS-COV-2 Vaccination 08/15/2019, 08/26/2019    Reviewed records from Dr. Einar Gip and her recent hospital stay.   Assessment:  Moderately Severe Emphysema FEV 54% of predicted Severe kyphosis Chronic Respiratory Failure on 3LNC continuously.  Some day tobacco use disorder, ongoing Severe osteoarthritis  Plan/Recommendations:  Continue to monitor off inhaler therapy per her request.  She mentioned to me that she has been feeling ready to die lately, "ready to go home" no active SI or HI. Just coming to terms with how miserable she feels on a daily basis and her limitations.  Her anxiety is not well controlled.  We discussed meeting with palliative care to talk about managing symptom burden of chronic progressive illness. I do not think she is mentally ready for hospice quite yet but we need to start having conversations about symptoms and goals of care.  She is agreeable to meeting with them. She has difficulty leaving her home so a home visit or video/telephone visit will be ideal. Her  friend and DPOA should be advised to be present with this Anderson [place])  I spent 30 minutes in the care of this patient today including pre-charting, chart review, review of results, face-to-face care, coordination of care and communication with consultants etc.).   Return to Care: Return in about 6 months (around 08/01/2022).   Lenice Llamas, MD Pulmonary and [place]

## 2022-01-31 NOTE — Patient Instructions (Addendum)
Please schedule follow up scheduled with me in 6 months.  If my schedule is not open yet, we will contact you with a reminder closer to that time. Please call [phone] if you haven't heard from [name] a month before.   Let me know if any issues or concerns about your breathing.  Eat smaller more frequent meals to help gain weight Try boost and ensure shakes to help put on calories.   I am putting in a referral to palliative care - the doctors that help with symptom management of chronic medical illness. They will call you to schedule.

## 2022-02-08 ENCOUNTER — Telehealth: Payer: Self-pay

## 2022-02-08 NOTE — Telephone Encounter (Signed)
She was previously taking 60 mg extended release twice daily and now probably 40 mg three times daily??

## 2022-02-08 NOTE — Telephone Encounter (Signed)
Patient called to let you know that she is currently admitted in a nursing home and she has had her oxycotin reduced to '40mg'$  and doesn;t know why. She wants to know if there anything you can do or someone you can call, and wants you please give her a call. Please advise.   Patient is [facility]  [phone]

## 2022-02-23 NOTE — Telephone Encounter (Signed)
Called patient to follow up, NA, LMAM

## 2022-02-28 DIAGNOSIS — J441 Chronic obstructive pulmonary disease with (acute) exacerbation: Secondary | ICD-10-CM | POA: Diagnosis not present

## 2022-02-28 DIAGNOSIS — R0602 Shortness of breath: Secondary | ICD-10-CM | POA: Diagnosis not present

## 2022-03-02 NOTE — Telephone Encounter (Signed)
Called and spoke with patient and she stated that the issue has been resolved.

## 2022-03-31 DIAGNOSIS — J441 Chronic obstructive pulmonary disease with (acute) exacerbation: Secondary | ICD-10-CM | POA: Diagnosis not present

## 2022-03-31 DIAGNOSIS — R0602 Shortness of breath: Secondary | ICD-10-CM | POA: Diagnosis not present

## 2022-04-05 ENCOUNTER — Telehealth: Payer: Self-pay | Admitting: Internal Medicine

## 2022-04-05 NOTE — Telephone Encounter (Signed)
Attempted to call pt but unable to reach. Left message for her to return call. 

## 2022-04-08 ENCOUNTER — Other Ambulatory Visit: Payer: Self-pay | Admitting: Cardiology

## 2022-04-19 ENCOUNTER — Telehealth: Payer: Self-pay | Admitting: Internal Medicine

## 2022-04-20 NOTE — Telephone Encounter (Signed)
Okay to change DME company from [facility] to [employer]

## 2022-04-20 NOTE — Telephone Encounter (Signed)
Spoke with the pt  She is needing to switch DME from [facility] to [employer]  She states that this is due to her no longer being in the [facility] program  She states that she is using 3-4 lpm with sleep and as needed during the day Please advise thanks

## 2022-04-21 NOTE — Telephone Encounter (Signed)
[facility] LVMTCB X 1. Pended O2 DME company change order.

## 2022-04-27 DIAGNOSIS — M47816 Spondylosis without myelopathy or radiculopathy, lumbar region: Secondary | ICD-10-CM | POA: Diagnosis not present

## 2022-04-27 DIAGNOSIS — R0602 Shortness of breath: Secondary | ICD-10-CM | POA: Diagnosis not present

## 2022-04-27 DIAGNOSIS — G894 Chronic pain syndrome: Secondary | ICD-10-CM | POA: Diagnosis not present

## 2022-04-27 DIAGNOSIS — J441 Chronic obstructive pulmonary disease with (acute) exacerbation: Secondary | ICD-10-CM | POA: Diagnosis not present

## 2022-04-27 DIAGNOSIS — Z79891 Long term (current) use of opiate analgesic: Secondary | ICD-10-CM | POA: Diagnosis not present

## 2022-04-27 DIAGNOSIS — M961 Postlaminectomy syndrome, not elsewhere classified: Secondary | ICD-10-CM | POA: Diagnosis not present

## 2022-04-30 DIAGNOSIS — J441 Chronic obstructive pulmonary disease with (acute) exacerbation: Secondary | ICD-10-CM | POA: Diagnosis not present

## 2022-04-30 DIAGNOSIS — R0602 Shortness of breath: Secondary | ICD-10-CM | POA: Diagnosis not present

## 2022-05-01 NOTE — Telephone Encounter (Signed)
Called patient but she did not answer. Left message for her to call back.  

## 2022-05-13 ENCOUNTER — Other Ambulatory Visit: Payer: Self-pay | Admitting: Cardiology

## 2022-05-13 DIAGNOSIS — I10 Essential (primary) hypertension: Secondary | ICD-10-CM

## 2022-05-16 DIAGNOSIS — R0602 Shortness of breath: Secondary | ICD-10-CM | POA: Diagnosis not present

## 2022-05-16 DIAGNOSIS — J441 Chronic obstructive pulmonary disease with (acute) exacerbation: Secondary | ICD-10-CM | POA: Diagnosis not present

## 2022-05-29 DIAGNOSIS — M47816 Spondylosis without myelopathy or radiculopathy, lumbar region: Secondary | ICD-10-CM | POA: Diagnosis not present

## 2022-05-29 DIAGNOSIS — M961 Postlaminectomy syndrome, not elsewhere classified: Secondary | ICD-10-CM | POA: Diagnosis not present

## 2022-05-29 DIAGNOSIS — G894 Chronic pain syndrome: Secondary | ICD-10-CM | POA: Diagnosis not present

## 2022-05-29 DIAGNOSIS — Z79891 Long term (current) use of opiate analgesic: Secondary | ICD-10-CM | POA: Diagnosis not present

## 2022-05-31 DIAGNOSIS — R0602 Shortness of breath: Secondary | ICD-10-CM | POA: Diagnosis not present

## 2022-05-31 DIAGNOSIS — J441 Chronic obstructive pulmonary disease with (acute) exacerbation: Secondary | ICD-10-CM | POA: Diagnosis not present

## 2022-06-09 ENCOUNTER — Other Ambulatory Visit: Payer: Self-pay | Admitting: Cardiology

## 2022-06-09 DIAGNOSIS — I10 Essential (primary) hypertension: Secondary | ICD-10-CM

## 2022-06-12 ENCOUNTER — Encounter: Payer: Self-pay | Admitting: Hematology

## 2022-06-13 ENCOUNTER — Telehealth: Payer: Self-pay

## 2022-06-13 DIAGNOSIS — I10 Essential (primary) hypertension: Secondary | ICD-10-CM

## 2022-06-13 DIAGNOSIS — E7800 Pure hypercholesterolemia, unspecified: Secondary | ICD-10-CM

## 2022-06-13 DIAGNOSIS — I739 Peripheral vascular disease, unspecified: Secondary | ICD-10-CM

## 2022-06-13 MED ORDER — AMLODIPINE BESYLATE 5 MG PO TABS: 5.0000 mg | ORAL_TABLET | Freq: Every day | ORAL | 3 refills | Status: DC

## 2022-06-13 MED ORDER — LOSARTAN POTASSIUM 25 MG PO TABS: 25.0000 mg | ORAL_TABLET | Freq: Every evening | ORAL | 3 refills | Status: DC

## 2022-06-13 MED ORDER — ATORVASTATIN CALCIUM 10 MG PO TABS: 10.0000 mg | ORAL_TABLET | Freq: Every day | ORAL | 3 refills | Status: DC

## 2022-06-13 NOTE — Telephone Encounter (Signed)
Medication refill requested 

## 2022-06-16 DIAGNOSIS — J441 Chronic obstructive pulmonary disease with (acute) exacerbation: Secondary | ICD-10-CM | POA: Diagnosis not present

## 2022-06-16 DIAGNOSIS — R0602 Shortness of breath: Secondary | ICD-10-CM | POA: Diagnosis not present

## 2022-06-23 ENCOUNTER — Telehealth: Payer: Self-pay | Admitting: Internal Medicine

## 2022-06-23 DIAGNOSIS — J449 Chronic obstructive pulmonary disease, unspecified: Secondary | ICD-10-CM

## 2022-06-23 NOTE — Telephone Encounter (Signed)
Spoke to pt who states she spoke with Ivin Booty from [employer] r/t motorized scooter. Pt states [employer] needs a new Rx and updated order. Order was placed. Nothing further needed at this time.

## 2022-06-26 DIAGNOSIS — H26493 Other secondary cataract, bilateral: Secondary | ICD-10-CM | POA: Diagnosis not present

## 2022-06-27 ENCOUNTER — Encounter: Payer: Self-pay | Admitting: Hematology

## 2022-06-27 ENCOUNTER — Telehealth: Payer: Self-pay | Admitting: Internal Medicine

## 2022-06-29 ENCOUNTER — Encounter: Payer: Self-pay | Admitting: Hematology

## 2022-06-29 NOTE — Telephone Encounter (Signed)
Patient is checking on order for oxygen. Patient phone number is [phone].

## 2022-06-29 NOTE — Telephone Encounter (Signed)
Called and spoke with patient. She stated that she is wanting to switch her oxygen over to [employer]. I explained to her that we would need to get her scheduled for an OV and walk test before placing the order. She verbalized understanding. She also mentioned that she is needing the order to be complete before March 1st due to her moving and she did not want to wait until ND came back from leave. I advised her the NPs would be able to get this done for her.   I was able to get her scheduled with Katie on 07/07/22 at 130pm. She will get here around 115pm due to needing to be walked beforehand.   Nothing further needed at time of call.

## 2022-07-04 ENCOUNTER — Encounter: Payer: Self-pay | Admitting: Cardiology

## 2022-07-04 ENCOUNTER — Ambulatory Visit: Payer: Medicare (Managed Care) | Admitting: Cardiology

## 2022-07-04 VITALS — BP 144/72 | HR 69 | Ht <= 58 in | Wt 120.0 lb

## 2022-07-04 DIAGNOSIS — J9611 Chronic respiratory failure with hypoxia: Secondary | ICD-10-CM

## 2022-07-04 DIAGNOSIS — I10 Essential (primary) hypertension: Secondary | ICD-10-CM | POA: Diagnosis not present

## 2022-07-04 DIAGNOSIS — G2581 Restless legs syndrome: Secondary | ICD-10-CM | POA: Diagnosis not present

## 2022-07-04 DIAGNOSIS — I739 Peripheral vascular disease, unspecified: Secondary | ICD-10-CM | POA: Diagnosis not present

## 2022-07-04 DIAGNOSIS — G6281 Critical illness polyneuropathy: Secondary | ICD-10-CM

## 2022-07-04 DIAGNOSIS — I5032 Chronic diastolic (congestive) heart failure: Secondary | ICD-10-CM | POA: Diagnosis not present

## 2022-07-04 MED ORDER — LOSARTAN POTASSIUM-HCTZ 100-12.5 MG PO TABS
1.0000 | ORAL_TABLET | Freq: Every day | ORAL | 3 refills | Status: DC
Start: 2022-07-04 — End: 2022-11-15

## 2022-07-04 MED ORDER — ROPINIROLE HCL 1 MG PO TABS
1.0000 mg | ORAL_TABLET | Freq: Three times a day (TID) | ORAL | 3 refills | Status: DC
Start: 2022-07-04 — End: 2023-03-27

## 2022-07-04 MED ORDER — FUROSEMIDE 20 MG PO TABS: 20.0000 mg | ORAL_TABLET | Freq: Every day | ORAL | 3 refills | Status: DC | PRN

## 2022-07-04 MED ORDER — POTASSIUM CHLORIDE ER 10 MEQ PO TBCR: 10.0000 meq | EXTENDED_RELEASE_TABLET | Freq: Every day | ORAL | 3 refills | Status: DC | PRN

## 2022-07-04 MED ORDER — AMLODIPINE BESYLATE 5 MG PO TABS: 5.0000 mg | ORAL_TABLET | Freq: Every day | ORAL | 3 refills | Status: DC

## 2022-07-04 MED ORDER — GABAPENTIN 300 MG PO CAPS: 300.0000 mg | ORAL_CAPSULE | Freq: Three times a day (TID) | ORAL | 3 refills | Status: DC

## 2022-07-04 NOTE — Progress Notes (Unsigned)
Primary Physician/Referring:  Almedia Balls, NP  Patient ID: [name], female    DOB: Jul 15, 1945, 77 y.o.   MRN: [id number]  Chief Complaint  Patient presents with   PAD (peripheral artery disease)    HPI: Holly S Crisp  is a 77 y.o. female  with peripheral artery disease has known long left SFA CTO with failed attempt in September 2020 for revascularization, also has right calcific iliac artery stenosis which was felt to be high risk due to extension into right common femoral artery for dissection. She has right hip claudication and also left leg claudication that is chronic but has remained stable.  Past medical history significant for tobacco use disorder, which she quit on 10/21/2019, but started smoking about 2 weeks ago 10/29/21, hypertension, COPD,Stage III chronic kidney disease, history of breast cancer, bilateral venous insufficiency and has had ablation and persistent superficial varicose veins with complications and recurrent varicose vein bleeding and h/o  UGI bleed on 09/18/2021 needing blood transfusion, EGD revealing nonbleeding gastric ulcer, acute gastritis and medium-sized hiatal hernia.    Patient presents for 1 month follow-up visit as it was extremely difficult to reconcile her medications.  She has stopped trusting anybody except me, she is asking and begging me to be her power of attorney for health.  She does not want to go to nursing home or get enrolled in pace program, her daughter had signed her up for this program without her consent.  She was extremely emotional today.  Otherwise except for chronic dyspnea she has no other specific complaints and denied any claudication, chest pain.  Dyspnea is stable and cough is stable.   Past Medical History:  Diagnosis Date   Acute renal failure superimposed on stage 3 chronic kidney disease ([place]) 10/27/2014   Back pain, chronic    Breast cancer ([place]) 09/09/2010   Cancer of colon ([place]) 05/24/2011   Centrilobular  emphysema ([place]) 07/15/2018   CKD (chronic kidney disease), stage III (HCC)    Claudication, intermittent ([place]) 07/15/2018   Depression    GIB (gastrointestinal bleeding)    H/O tobacco use, presenting hazards to health Quit Dec 2019 05/03/2011   50 years, up to 3PPD quit last year.    Hx of varicose veins of lower extremity 07/15/2018   Hyperlipidemia    Hypertension    SDH (subdural hematoma) (HCC)    SOB (shortness of breath) 07/15/2018    Social History   Tobacco Use   Smoking status: Former    Packs/day: 0.25    Years: 30.00    Total pack years: 7.50    Types: Cigarettes    Quit date: 10/31/2019    Years since quitting: 2.6   Smokeless tobacco: Never   Tobacco comments:    had 6 cigarettes recently 03-10-20  Substance Use Topics   Alcohol use: Yes    Comment: rarely   Marital Status: Divorced   Review of Systems  Constitutional: Positive for malaise/fatigue.  Cardiovascular:  Positive for claudication (very mild) and dyspnea on exertion. Negative for chest pain, leg swelling and orthopnea.   Objective  Blood pressure (!) 144/72, pulse 69, height 4' 8"$  (1.422 m), weight 120 lb (54.4 kg), SpO2 92 %. Body mass index is 26.9 kg/m.     07/04/2022    2:05 PM 07/04/2022    1:53 PM 01/31/2022    3:28 PM  Vitals with BMI  Height  4' 8"$  4' 8"$   Weight  120 lbs 104 lbs  BMI  99991111 0000000  Systolic 123456 [id number]  Diastolic 72 52 68  Pulse  69 59     Orthostatic VS for the past 72 hrs (Last 3 readings):  Patient Position BP Location Cuff Size  07/04/22 1405 Sitting Left Arm Normal  07/04/22 1353 Sitting Left Arm Normal     Physical Exam Vitals reviewed.  Constitutional:      Appearance: She is well-developed.  Neck:     Vascular: No carotid bruit or JVD.  Cardiovascular:     Rate and Rhythm: Normal rate and regular rhythm. No extrasystoles are present.    Pulses:          Femoral pulses are 2+ on the right side with bruit.      Popliteal pulses are 1+ on the right  side and 1+ on the left side.       Dorsalis pedis pulses are 1+ on the right side and 0 on the left side.       Posterior tibial pulses are 0 on the left side.     Heart sounds: Normal heart sounds.  Pulmonary:     Effort: Pulmonary effort is normal. No accessory muscle usage or respiratory distress.     Breath sounds: Rales (Occasional scattered) present. No wheezing.  Musculoskeletal:     Right lower leg: Edema (1-2+ pitting) present.     Left lower leg: Edema (1-2+ pitting) present.  Skin:    Capillary Refill: Capillary refill takes less than 2 seconds.     Findings: No erythema.  Neurological:     Comments: Patient is somnolent on exam and appears mildly confused     Radiology: No results found.  Laboratory examination:       Latest Ref Rng & Units 11/04/2021    2:47 PM 10/25/2021   12:15 PM 09/21/2021    5:29 AM  CMP  Glucose 70 - 99 mg/dL 107  106  103   BUN 8 - 27 mg/dL 27  37  32   Creatinine 0.57 - 1.00 mg/dL 1.37  2.72  1.08   Sodium 134 - 144 mmol/L 143  142  142   Potassium 3.5 - 5.2 mmol/L 3.8  3.6  4.6   Chloride 96 - 106 mmol/L 101  101  107   CO2 20 - 29 mmol/L 26  29  30   $ Calcium 8.7 - 10.3 mg/dL 11.4  10.8  10.3   Total Protein 6.5 - 8.1 g/dL  6.6    Total Bilirubin 0.3 - 1.2 mg/dL  0.7    Alkaline Phos 38 - 126 U/L  79    AST 15 - 41 U/L  31    ALT 0 - 44 U/L  19        Latest Ref Rng & Units 10/25/2021   12:15 PM 09/22/2021    5:29 AM 09/21/2021    6:21 PM  CBC  WBC 4.0 - 10.5 K/uL 3.7  3.0    Hemoglobin 12.0 - 15.0 g/dL 11.4  8.6  9.1   Hematocrit 36.0 - 46.0 % 35.6  27.0  28.6   Platelets 150 - 400 K/uL 184  121     External labs:  Labs 10/04/2021:  Hb 9.7/HCT 29.6, platelets 245, normal indicis.  BUN 28, creatinine 1.61, EGFR 33 mL, potassium 4.2.  LFTs normal.  Labs 03/03/2021:  Hb 12.3/HCT 36.7, platelets 216.  Normal indicis.  Serum glucose 82 mg, BUN 28, creatinine 1.33, EGFR 42 mL, potassium 4.1, LFTs  normal.  Magnesium 2.0.  Lipid  profile 07/28/2020:  Total cholesterol 149, triglycerides 74, HDL 78, LDL 57.   Current Outpatient Medications:    ALPRAZolam (XANAX) 1 MG tablet, Take 1 tablet (1 mg total) by mouth 2 (two) times daily as needed., Disp: 30 tablet, Rfl: 0   atorvastatin (LIPITOR) 10 MG tablet, Take 1 tablet (10 mg total) by mouth daily., Disp: 90 tablet, Rfl: 3   Multiple Vitamins-Minerals (MULTIVITAMIN ADULT EXTRA C) CHEW, , Disp: , Rfl:    amLODipine (NORVASC) 5 MG tablet, Take 1 tablet (5 mg total) by mouth daily., Disp: 90 tablet, Rfl: 3   furosemide (LASIX) 20 MG tablet, Take 1 tablet (20 mg total) by mouth daily as needed for fluid or edema., Disp: 90 tablet, Rfl: 3   gabapentin (NEURONTIN) 300 MG capsule, Take 1 capsule (300 mg total) by mouth 3 (three) times daily., Disp: 270 capsule, Rfl: 3   losartan-hydrochlorothiazide (HYZAAR) 100-12.5 MG tablet, Take 1 tablet by mouth daily., Disp: 90 tablet, Rfl: 3   potassium chloride (KLOR-CON) 10 MEQ tablet, Take 1 tablet (10 mEq total) by mouth daily as needed (With Furosemide). Take with lasix, Disp: 90 tablet, Rfl: 3   rOPINIRole (REQUIP) 1 MG tablet, Take 1 tablet (1 mg total) by mouth 3 (three) times daily., Disp: 90 tablet, Rfl: 3   Cardiac Studies:   Lower Extremity Arterial Duplex 01/14/2019: There is monophasic waveform noted in the right external iliac and CFA suggests proximal significant disease. There is severe diffuse mixed plaque noted throughout the right lower extremity.  Moderate velocity increase at the left distal superficial femoral artery, >50% stenosis. There is severe diffuse mixed plaque throughout the left lower extremity This exam reveals moderately decreased perfusion of the right lower extremity, noted at the dorsalis pedis artery level (ABI 0.75) and moderately decreased perfusion of the left lower extremity, noted at the post tibial artery level (ABI 0.58).  No significant change from report of 01/11/2015.   Peripheral arteriogram  03/18/2019:  Dist R EIA to Prox R CFA lesion is 60% stenosed.  Prox R CIA to Mid R EIA lesion is 20% stenosed.  Prox L CIA to Mid L CFA lesion is 20% stenosed.  Prox L SFA to Dist L SFA lesion is 100% stenosed.  Three-vessel runoff below the knee on the left with brisk flow.   Failed attempt at angioplasty of the left SFA.    Suprarenal Abd AO to Infrarenal Abd AO lesion is 30% stenosed. Severely calcified lesions.  Abdominal aortogram also reveals patent renal arteries 1 on either side. There is no evidence of abdominal aortic aneurysm.  PCV ECHOCARDIOGRAM COMPLETE 11/04/2021  Narrative Echocardiogram 11/04/2021: Left ventricle cavity is normal in size and wall thickness. Normal global wall motion. Normal LV systolic function with visual EF 50-55%. Doppler evidence of grade I (impaired) diastolic dysfunction, normal LAP. Left atrial cavity is moderately dilated. Structurally normal trileaflet aortic valve.  Trace aortic regurgitation. Mild to moderate mitral regurgitation. Mild to moderate tricuspid regurgitation. Estimated pulmonary artery systolic pressure 38 mmHg. Compared to previous study in 2020, trace aortic regurgitation is new. No other significant change noted.   EKG:  EKG normal sinus rhythm at rate of 67 bpm with sinus arrhythmia.  Normal axis.  Right bundle branch block.  PACs. No significant change from EKG 11/16/2020.   Assessment     ICD-10-CM   1. PAD (peripheral artery disease) (HCC)  I73.9 EKG 12-Lead    2. Primary hypertension  I10  amLODipine (NORVASC) 5 MG tablet    losartan-hydrochlorothiazide (HYZAAR) 100-12.5 MG tablet    3. Chronic hypoxemic respiratory failure (HCC)  J96.11     4. Chronic diastolic CHF (congestive heart failure) (HCC)  I50.32 furosemide (LASIX) 20 MG tablet    potassium chloride (KLOR-CON) 10 MEQ tablet    5. Restless leg  G25.81 rOPINIRole (REQUIP) 1 MG tablet    6. Polyneuropathy associated with critical illness (HCC)  G62.81  gabapentin (NEURONTIN) 300 MG capsule       Meds ordered this encounter  Medications   amLODipine (NORVASC) 5 MG tablet    Sig: Take 1 tablet (5 mg total) by mouth daily.    Dispense:  90 tablet    Refill:  3   furosemide (LASIX) 20 MG tablet    Sig: Take 1 tablet (20 mg total) by mouth daily as needed for fluid or edema.    Dispense:  90 tablet    Refill:  3   losartan-hydrochlorothiazide (HYZAAR) 100-12.5 MG tablet    Sig: Take 1 tablet by mouth daily.    Dispense:  90 tablet    Refill:  3   rOPINIRole (REQUIP) 1 MG tablet    Sig: Take 1 tablet (1 mg total) by mouth 3 (three) times daily.    Dispense:  90 tablet    Refill:  3   potassium chloride (KLOR-CON) 10 MEQ tablet    Sig: Take 1 tablet (10 mEq total) by mouth daily as needed (With Furosemide). Take with lasix    Dispense:  90 tablet    Refill:  3    REFILL, OUT OF REFILL   gabapentin (NEURONTIN) 300 MG capsule    Sig: Take 1 capsule (300 mg total) by mouth 3 (three) times daily.    Dispense:  270 capsule    Refill:  3   Medications Discontinued During This Encounter  Medication Reason   buPROPion (ZYBAN) 150 MG 12 hr tablet Patient Preference   cyanocobalamin 1000 MCG tablet Patient Preference   losartan (COZAAR) 25 MG tablet Patient Preference   metoprolol succinate (TOPROL-XL) 50 MG 24 hr tablet Patient Preference   oxyCODONE (OXYCONTIN) 60 MG 12 hr tablet Patient Preference   Vitamin D, Cholecalciferol, 25 MCG (1000 UT) TABS Patient Preference   acetaminophen (TYLENOL) 325 MG tablet Patient Preference   furosemide (LASIX) 20 MG tablet Reorder   potassium chloride (KLOR-CON) 10 MEQ tablet Reorder   rOPINIRole (REQUIP) 1 MG tablet Reorder   amLODipine (NORVASC) 5 MG tablet Reorder   losartan-hydrochlorothiazide (HYZAAR) 100-12.5 MG tablet Reorder   gabapentin (NEURONTIN) 300 MG capsule Reorder     Orders Placed This Encounter  Procedures   EKG 12-Lead    Recommendations:   Holly S Jasso  is a 77  y.o. with peripheral artery disease has known long left SFA CTO with failed attempt in September 2020 for revascularization, also has right calcific iliac artery stenosis which was felt to be high risk due to extension into right common femoral artery for dissection. She has right hip claudication and also left leg claudication that is chronic but has remained stable.  Past medical history significant for tobacco use disorder, which she quit on 10/21/2019, but started smoking about 2 weeks ago 10/29/21, hypertension, COPD,Stage III chronic kidney disease, history of breast cancer, bilateral venous insufficiency and has had ablation and persistent superficial varicose veins with complications and recurrent varicose vein bleeding and h/o  UGI bleed on 09/18/2021 needing blood transfusion, EGD revealing nonbleeding  gastric ulcer, acute gastritis and medium-sized hiatal hernia.    Patient presents for 1 month follow-up visit as it was extremely difficult to reconcile her medications.  She has stopped testing anybody except me, she is asking and begging me to be her power of attorney for health which are accepted.  I reviewed all her medications, reconciled her medications, advised her about the medication that she needs to be on, blood pressure is markedly elevated.  This is because she has not been taking beta-blockers or ARB.  Low-dose losartan started today in view of acute renal failure on high-dose.  I will restart metoprolol succinate as well.  I have sorted out her medications and advised her which medications not to take.  She is concerned about her daughter putting her in "pace program" and states that she does not want to leave her home until she is completely disabled or not able to take care of herself.  Extremely difficult situation with regard to social issues, I did offer to speak to her daughter or her son.  I would like to see her back in 1 month for follow-up and review of extensive  medication reconciliation and follow-up of hypertension, hypoxemia, peripheral arterial disease.  We discussed regarding CODE STATUS on her next office visit.   Adrian Prows, MD, [facility] 07/04/2022, 2:36 PM Office: [phone] Fax: [phone] Pager: [phone]

## 2022-07-07 ENCOUNTER — Ambulatory Visit (INDEPENDENT_AMBULATORY_CARE_PROVIDER_SITE_OTHER): Payer: Medicare (Managed Care) | Admitting: Nurse Practitioner

## 2022-07-07 ENCOUNTER — Encounter: Payer: Self-pay | Admitting: Nurse Practitioner

## 2022-07-07 VITALS — BP 140/66 | HR 63 | Temp 97.9°F | Ht <= 58 in | Wt 118.0 lb

## 2022-07-07 DIAGNOSIS — J449 Chronic obstructive pulmonary disease, unspecified: Secondary | ICD-10-CM

## 2022-07-07 DIAGNOSIS — J9611 Chronic respiratory failure with hypoxia: Secondary | ICD-10-CM | POA: Diagnosis not present

## 2022-07-07 NOTE — Assessment & Plan Note (Signed)
Stable without increased O2 requirement. She desaturated to 87% on room air with exertion. She was able to maintain >90% on 2 lpm POC. Orders placed today. Goal >88-90%

## 2022-07-07 NOTE — Patient Instructions (Addendum)
Continue to monitor symptoms off inhaler therapy  Continue supplemental oxygen 2-3 lpm with activity for goal >88-90%   Follow up in 6 months with Dr. Shearon Stalls. If symptoms worsen, please contact office for sooner follow up or seek emergency care.

## 2022-07-07 NOTE — Assessment & Plan Note (Signed)
Severe COPD. She is not maintained on inhaler therapy. Declined to start. She is clinically stable. We will continue to monitor her. Action plan in place.  Patient Instructions  Continue to monitor symptoms off inhaler therapy  Continue supplemental oxygen 2-3 lpm with activity for goal >88-90%   Follow up in 6 months with Dr. Shearon Stalls. If symptoms worsen, please contact office for sooner follow up or seek emergency care.

## 2022-07-07 NOTE — Progress Notes (Signed)
$@PatientW$  ID: [name], female    DOB: 12/18/1945, 77 y.o.   MRN: [name]  Chief Complaint  Patient presents with   Follow-up    Here for o2 recert. She states that she uses o2 at home "when I need it" 3.5 lpm.     Referring provider: Almedia Balls, NP  HPI: 77 year old female, former smoker followed for severe COPD and chronic respiratory failure on supplemental O2.  She is patient Dr. Mauricio Po and last seen in office 01/31/2022.  Past medical history significant for PAD, CHF, hypertension, history of colon cancer, history of subdural hematoma, CKD stage III, IDA, anxiety and depression, RLS.  TEST/EVENTS:  04/09/2020 PFT: FVC 56, FEV1 53, ratio 57, TLC 100, DLCOcor 55 11/04/2021 echo: EF 50 to 55%.  G1 DD.  LA moderately dilated.  Trace AR.  Mild to moderate mitral regurgitation.  Mild to moderate tricuspid regurgitation.  PASP 38 mmHg  01/31/2022: OV with Dr. Shearon Stalls for COPD follow-up.  No interval hospitalizations for COPD exacerbations.  Still on inhaler therapy breathing is stable with no change.  Anxiety is an issue.  Wondering if she can add medication for this and other symptoms which are related to her emphysema.  Discussed meeting with palliative care to talk about managing symptom burden of chronic progressive illness.  She is agreeable to meeting with them.  Referral placed.  Continue to monitor off inhaler therapy.  07/07/2022: Today-follow-up Patient presents today for follow up. She needs to change medical supply companies for her oxygen from [facility] to [facility]. She tells me that her breathing has been stable since she was here last. No hospitalizations or exacerbations. She denies increased cough or chest congestion. She is still not on inhaler therapy. She has tried some in the past but they all make her dizzy. She would prefer to remain off of these. She continues to wear 2-3 lpm supplemental oxygen with activity. She never met with palliative care. Tells me today she  would prefer to hold off on this. She is getting ready to move next week as well. Does have help with this.   No Known Allergies  Immunization History  Administered Date(s) Administered   PFIZER(Purple Top)SARS-COV-2 Vaccination 08/15/2019, 08/26/2019    Past Medical History:  Diagnosis Date   Acute renal failure superimposed on stage 3 chronic kidney disease ([place]) 10/27/2014   Back pain, chronic    Breast cancer ([place]) 09/09/2010   Cancer of colon ([place]) 05/24/2011   Centrilobular emphysema ([place]) 07/15/2018   CKD (chronic kidney disease), stage III (HCC)    Claudication, intermittent ([place]) 07/15/2018   Depression    GIB (gastrointestinal bleeding)    H/O tobacco use, presenting hazards to health Quit Dec 2019 05/03/2011   50 years, up to 3PPD quit last year.    Hx of varicose veins of lower extremity 07/15/2018   Hyperlipidemia    Hypertension    SDH (subdural hematoma) (HCC)    SOB (shortness of breath) 07/15/2018    Tobacco History: Social History   Tobacco Use  Smoking Status Former   Packs/day: 0.25   Years: 30.00   Total pack years: 7.50   Types: Cigarettes   Quit date: 10/31/2019   Years since quitting: 2.6  Smokeless Tobacco Never  Tobacco Comments   had 6 cigarettes recently 03-10-20   Counseling given: Not Answered Tobacco comments: had 6 cigarettes recently 03-10-20   Outpatient Medications Prior to Visit  Medication Sig Dispense Refill   ALPRAZolam (XANAX) 1 MG  tablet Take 1 tablet (1 mg total) by mouth 2 (two) times daily as needed. 30 tablet 0   amLODipine (NORVASC) 5 MG tablet Take 1 tablet (5 mg total) by mouth daily. 90 tablet 3   atorvastatin (LIPITOR) 10 MG tablet Take 1 tablet (10 mg total) by mouth daily. 90 tablet 3   furosemide (LASIX) 20 MG tablet Take 1 tablet (20 mg total) by mouth daily as needed for fluid or edema. 90 tablet 3   gabapentin (NEURONTIN) 300 MG capsule Take 1 capsule (300 mg total) by mouth 3 (three) times daily. 270 capsule  3   losartan-hydrochlorothiazide (HYZAAR) 100-12.5 MG tablet Take 1 tablet by mouth daily. 90 tablet 3   Multiple Vitamins-Minerals (MULTIVITAMIN ADULT EXTRA C) CHEW      potassium chloride (KLOR-CON) 10 MEQ tablet Take 1 tablet (10 mEq total) by mouth daily as needed (With Furosemide). Take with lasix 90 tablet 3   rOPINIRole (REQUIP) 1 MG tablet Take 1 tablet (1 mg total) by mouth 3 (three) times daily. 90 tablet 3   No facility-administered medications prior to visit.     Review of Systems:   Constitutional: No weight loss or gain, night sweats, fevers, chills +fatigue, lassitude (baseline) HEENT: No headaches, difficulty swallowing, tooth/dental problems, or sore throat. No sneezing, itching, ear ache, nasal congestion, or post nasal drip CV:  No chest pain, orthopnea, PND, swelling in lower extremities, anasarca, dizziness, palpitations, syncope Resp: +shortness of breath with exertion; occasional cough. No excess mucus or change in color of mucus. No hemoptysis. No wheezing.  No chest wall deformity GI:  No heartburn, indigestion, abdominal pain, loss of appetite Skin: No rash, lesions, ulcerations MSK:  No joint pain or swelling.   Neuro: No dizziness or lightheadedness.  Psych: No depression. +anxiety. No SI/HI. Mood stable.     Physical Exam:  BP (!) 140/66 (BP Location: Left Arm, Cuff Size: Normal)   Pulse 63   Temp 97.9 F (36.6 C) (Oral)   Ht 4' 8"$  (1.422 m)   Wt 118 lb (53.5 kg)   SpO2 97% Comment: on RA  BMI 26.46 kg/m   GEN: Pleasant, interactive, chronically-ill appearing; in no acute distress. HEENT:  Normocephalic and atraumatic. PERRLA. Sclera white. Nasal turbinates pink, moist and patent bilaterally. No rhinorrhea present. Oropharynx pink and moist, without exudate or edema. No lesions, ulcerations, or postnasal drip.  NECK:  Supple w/ fair ROM. No JVD present. Normal carotid impulses w/o bruits. Thyroid symmetrical with no goiter or nodules palpated. No  lymphadenopathy.   CV: RRR, no m/r/g, no peripheral edema. Pulses intact, +2 bilaterally. No cyanosis, pallor or clubbing. PULMONARY:  Unlabored, regular breathing. Diminished bilaterally A&P w/o wheezes/rales/rhonchi. No accessory muscle use.  GI: BS present and normoactive. Soft, non-tender to palpation. No organomegaly or masses detected. MSK: No erythema, warmth or tenderness. Cap refil <2 sec all extrem. No deformities or joint swelling noted.  Neuro: A/Ox3. No focal deficits noted.   Skin: Warm, no lesions or rashe Psych: Normal affect and behavior. Judgement and thought content appropriate.     Lab Results:  CBC    Component Value Date/Time   WBC 3.7 (L) 10/25/2021 1215   RBC 3.90 10/25/2021 1215   HGB 11.4 (L) 10/25/2021 1215   HGB 9.9 (L) 07/14/2019 1449   HGB 13.3 02/22/2016 1242   HCT 35.6 (L) 10/25/2021 1215   HCT 31.2 (L) 07/14/2019 1449   HCT 39.0 02/22/2016 1242   PLT 184 10/25/2021 1215   PLT  213 07/14/2019 1449   MCV 91.3 10/25/2021 1215   MCV 83 07/14/2019 1449   MCV 93.1 02/22/2016 1242   MCH 29.2 10/25/2021 1215   MCHC 32.0 10/25/2021 1215   RDW 13.2 10/25/2021 1215   RDW 14.6 07/14/2019 1449   RDW 13.0 02/22/2016 1242   LYMPHSABS 0.9 10/25/2021 1215   LYMPHSABS 1.7 02/22/2016 1242   MONOABS 0.4 10/25/2021 1215   MONOABS 0.7 02/22/2016 1242   EOSABS 0.0 10/25/2021 1215   EOSABS 0.1 02/22/2016 1242   BASOSABS 0.0 10/25/2021 1215   BASOSABS 0.0 02/22/2016 1242    BMET    Component Value Date/Time   NA 143 11/04/2021 1447   NA 140 01/25/2016 1310   K 3.8 11/04/2021 1447   K 3.8 01/25/2016 1310   CL 101 11/04/2021 1447   CL 101 04/01/2012 1232   CO2 26 11/04/2021 1447   CO2 23 01/25/2016 1310   GLUCOSE 107 (H) 11/04/2021 1447   GLUCOSE 106 (H) 10/25/2021 1215   GLUCOSE 153 (H) 01/25/2016 1310   GLUCOSE 87 04/01/2012 1232   BUN 27 11/04/2021 1447   BUN 44.8 (H) 01/25/2016 1310   CREATININE 1.37 (H) 11/04/2021 1447   CREATININE 1.6 (H)  01/25/2016 1310   CALCIUM 11.4 (H) 11/04/2021 1447   CALCIUM 9.8 01/25/2016 1310   GFRNONAA 18 (L) 10/25/2021 1215   GFRAA 40 (L) 11/15/2019 0740    BNP    Component Value Date/Time   BNP 199.9 (H) 11/04/2021 1447   BNP 158.7 (H) 11/07/2020 2250     Imaging:  No results found.       Latest Ref Rng & Units 04/08/2020    2:50 PM  PFT Results  FVC-Pre L 1.37   FVC-Predicted Pre % 56   FVC-Post L 1.70   FVC-Predicted Post % 70   Pre FEV1/FVC % % 70   Post FEV1/FCV % % 57   FEV1-Pre L 0.96   FEV1-Predicted Pre % 53   FEV1-Post L 0.96   DLCO uncorrected ml/min/mmHg 9.26   DLCO UNC% % 55   DLCO corrected ml/min/mmHg 9.26   DLCO COR %Predicted % 55   DLVA Predicted % 65   TLC L 4.47   TLC % Predicted % 100   RV % Predicted % 121     No results found for: "NITRICOXIDE"      Assessment & Plan:   COPD (chronic obstructive pulmonary disease) (HCC) Severe COPD. She is not maintained on inhaler therapy. Declined to start. She is clinically stable. We will continue to monitor her. Action plan in place.  Patient Instructions  Continue to monitor symptoms off inhaler therapy  Continue supplemental oxygen 2-3 lpm with activity for goal >88-90%   Follow up in 6 months with Dr. Shearon Stalls. If symptoms worsen, please contact office for sooner follow up or seek emergency care.    Chronic respiratory failure with hypoxia (HCC) Stable without increased O2 requirement. She desaturated to 87% on room air with exertion. She was able to maintain >90% on 2 lpm POC. Orders placed today. Goal >88-90%   I spent 28 minutes of dedicated to the care of this patient on the date of this encounter to include pre-visit review of records, face-to-face time with the patient discussing conditions above, post visit ordering of testing, clinical documentation with the electronic health record, making appropriate referrals as documented, and communicating necessary findings to members of the patients  care team.  Clayton Bibles, NP 07/07/2022  Pt aware  and understands NP's role.

## 2022-07-11 ENCOUNTER — Telehealth: Payer: Self-pay | Admitting: Nurse Practitioner

## 2022-07-11 DIAGNOSIS — Z79891 Long term (current) use of opiate analgesic: Secondary | ICD-10-CM | POA: Diagnosis not present

## 2022-07-11 DIAGNOSIS — M47816 Spondylosis without myelopathy or radiculopathy, lumbar region: Secondary | ICD-10-CM | POA: Diagnosis not present

## 2022-07-11 DIAGNOSIS — M961 Postlaminectomy syndrome, not elsewhere classified: Secondary | ICD-10-CM | POA: Diagnosis not present

## 2022-07-11 DIAGNOSIS — G894 Chronic pain syndrome: Secondary | ICD-10-CM | POA: Diagnosis not present

## 2022-07-11 NOTE — Telephone Encounter (Signed)
Spoke with patient she is wanting to know if she would benefit from using a nebulizer machine. She states sob increases with exertion and wonders if this could help Improve her breathing. She advises with previous hospital stay she has been given a neb treatment and noticed a difference with her breathing. Katie please advise if your okay with this and I will place the order

## 2022-07-11 NOTE — Telephone Encounter (Signed)
Pt. Calling to ask what she need to do if her pulse oxygen level drops if it gets low please advise pt.

## 2022-07-12 ENCOUNTER — Other Ambulatory Visit: Payer: Self-pay

## 2022-07-12 DIAGNOSIS — J449 Chronic obstructive pulmonary disease, unspecified: Secondary | ICD-10-CM

## 2022-07-12 MED ORDER — IPRATROPIUM-ALBUTEROL 0.5-2.5 (3) MG/3ML IN SOLN: 3.0000 mL | Freq: Four times a day (QID) | RESPIRATORY_TRACT | 6 refills | Status: DC | PRN

## 2022-07-12 NOTE — Telephone Encounter (Signed)
She has had issues with dizziness in the past with inhalers but if she felt like nebulizers helped her breathing and she tolerated them well, we can definitely send them for her to have. Ok to send duoneb 3 mL every 6 hours as needed for shortness of breath or wheezing. Send neb machine orders as well. Thanks!

## 2022-07-12 NOTE — Telephone Encounter (Signed)
Spoke with patient. Advised patient I have placed an order for a nebulizer machine and sent medication to her pharmacy. She verbalized understanding. Nothing further needed.

## 2022-07-15 IMAGING — DX DG ANKLE COMPLETE 3+V*R*
3 series · 3 of 3 positions shown · non-contrast
Comparison: 08/16/2016

CLINICAL DATA: Fell twice yesterday, bruising and swelling, lateral
foot and ankle pain

EXAM:
RIGHT FOOT COMPLETE - 3+ VIEW; RIGHT ANKLE - COMPLETE 3+ VIEW

[ankle ap]
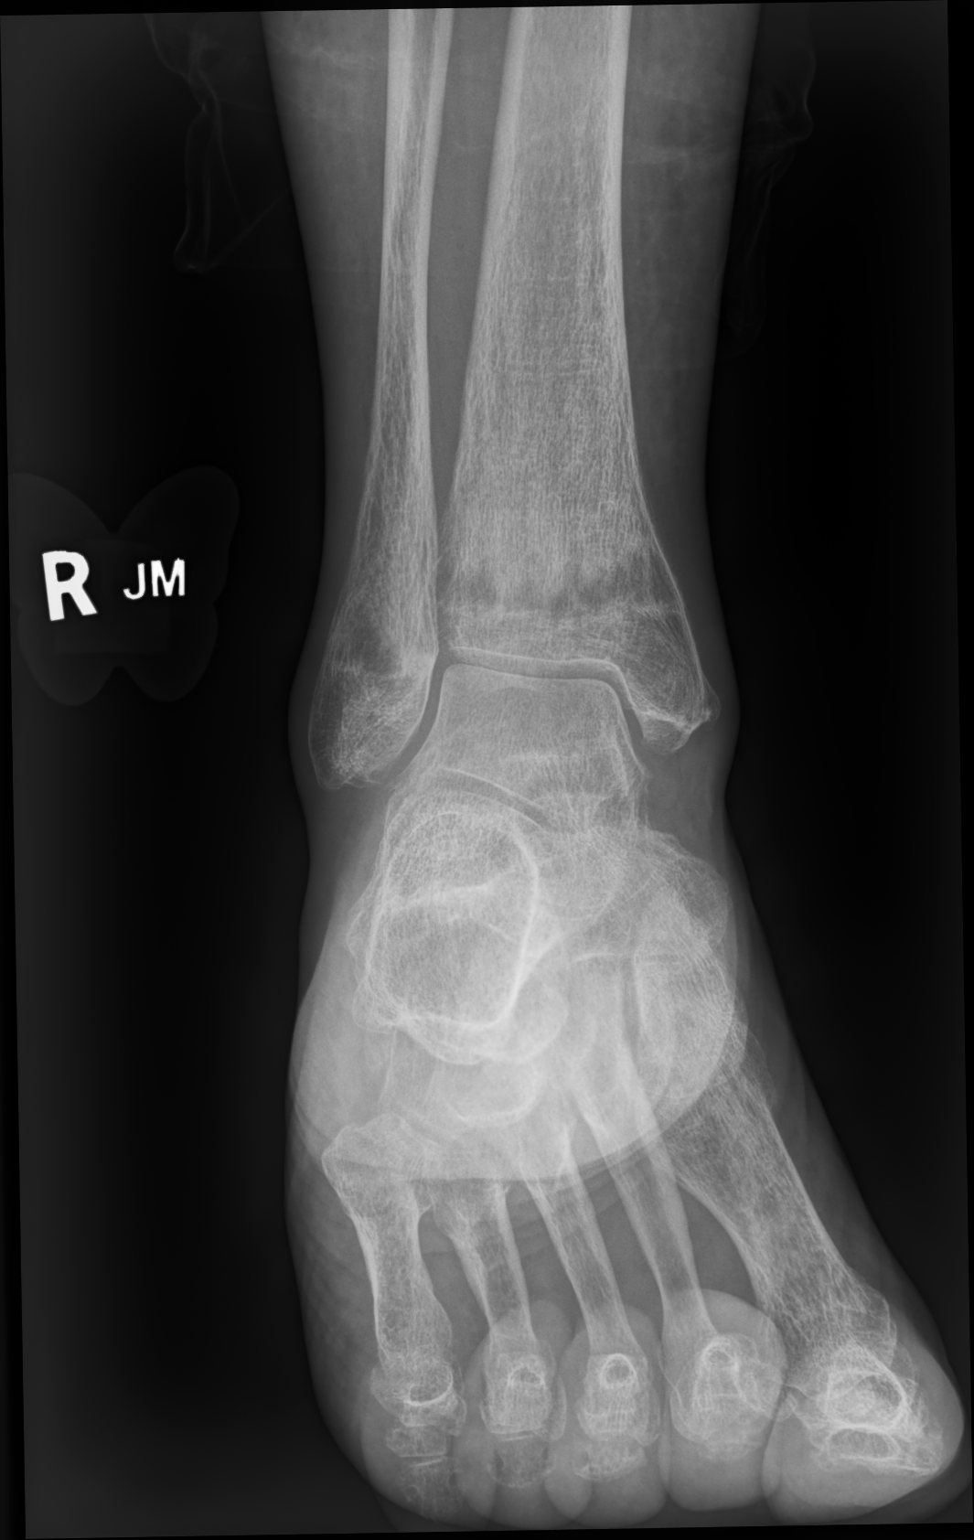

[ankle medial oblique]
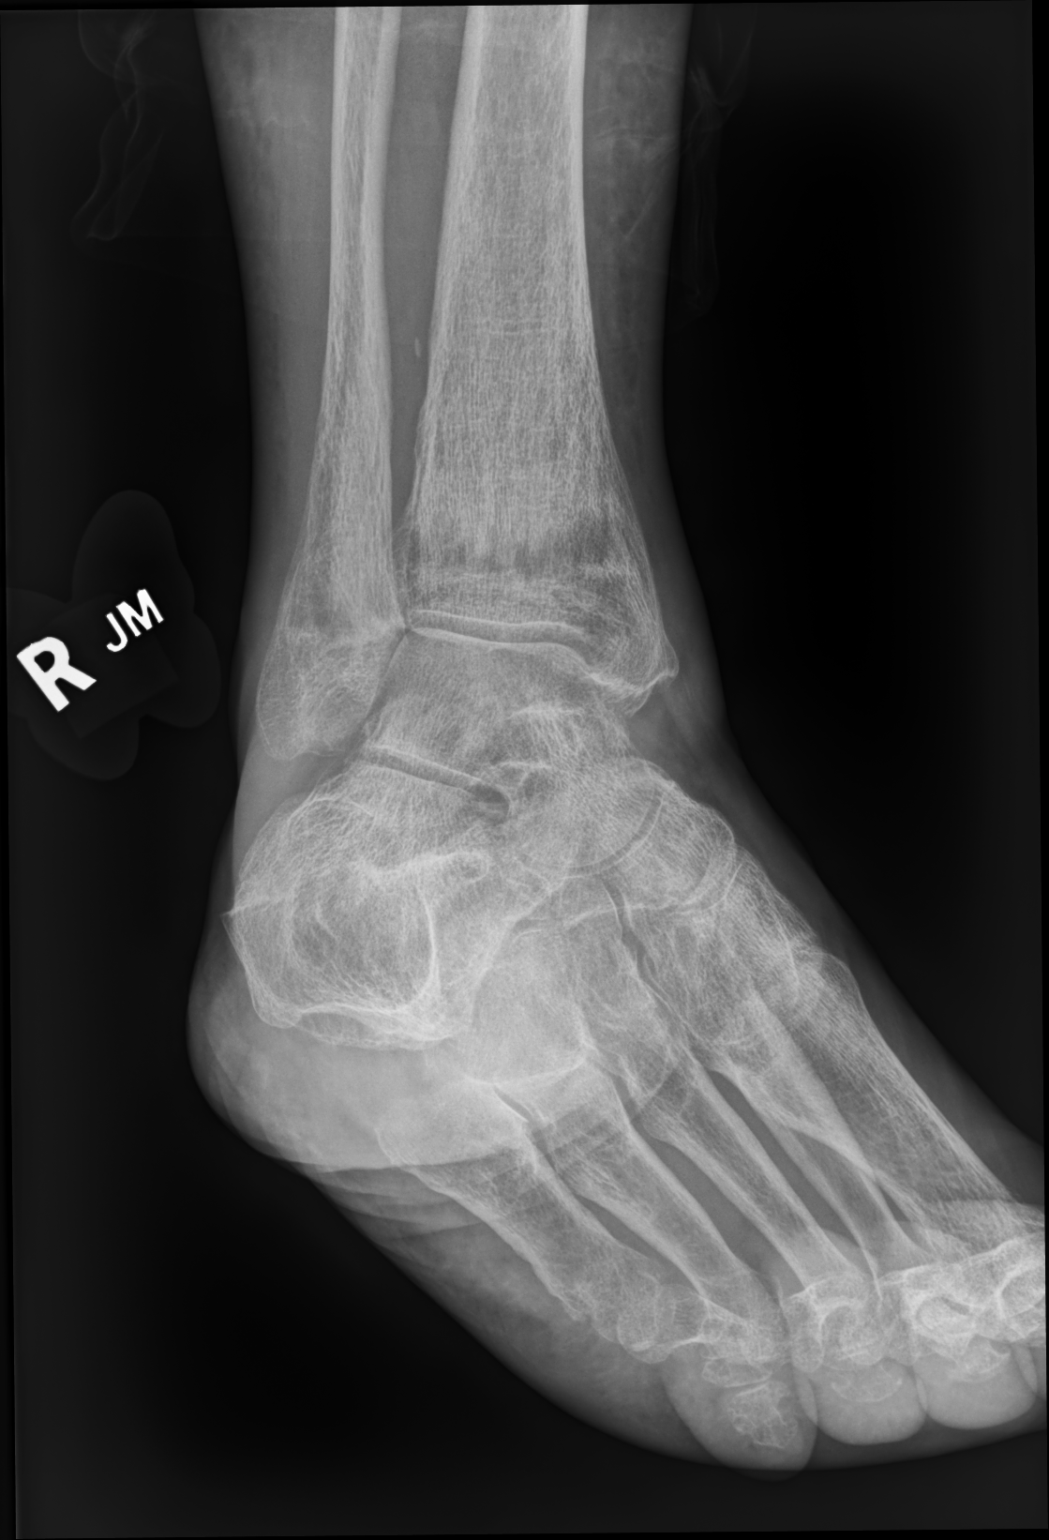

[ankle lat]
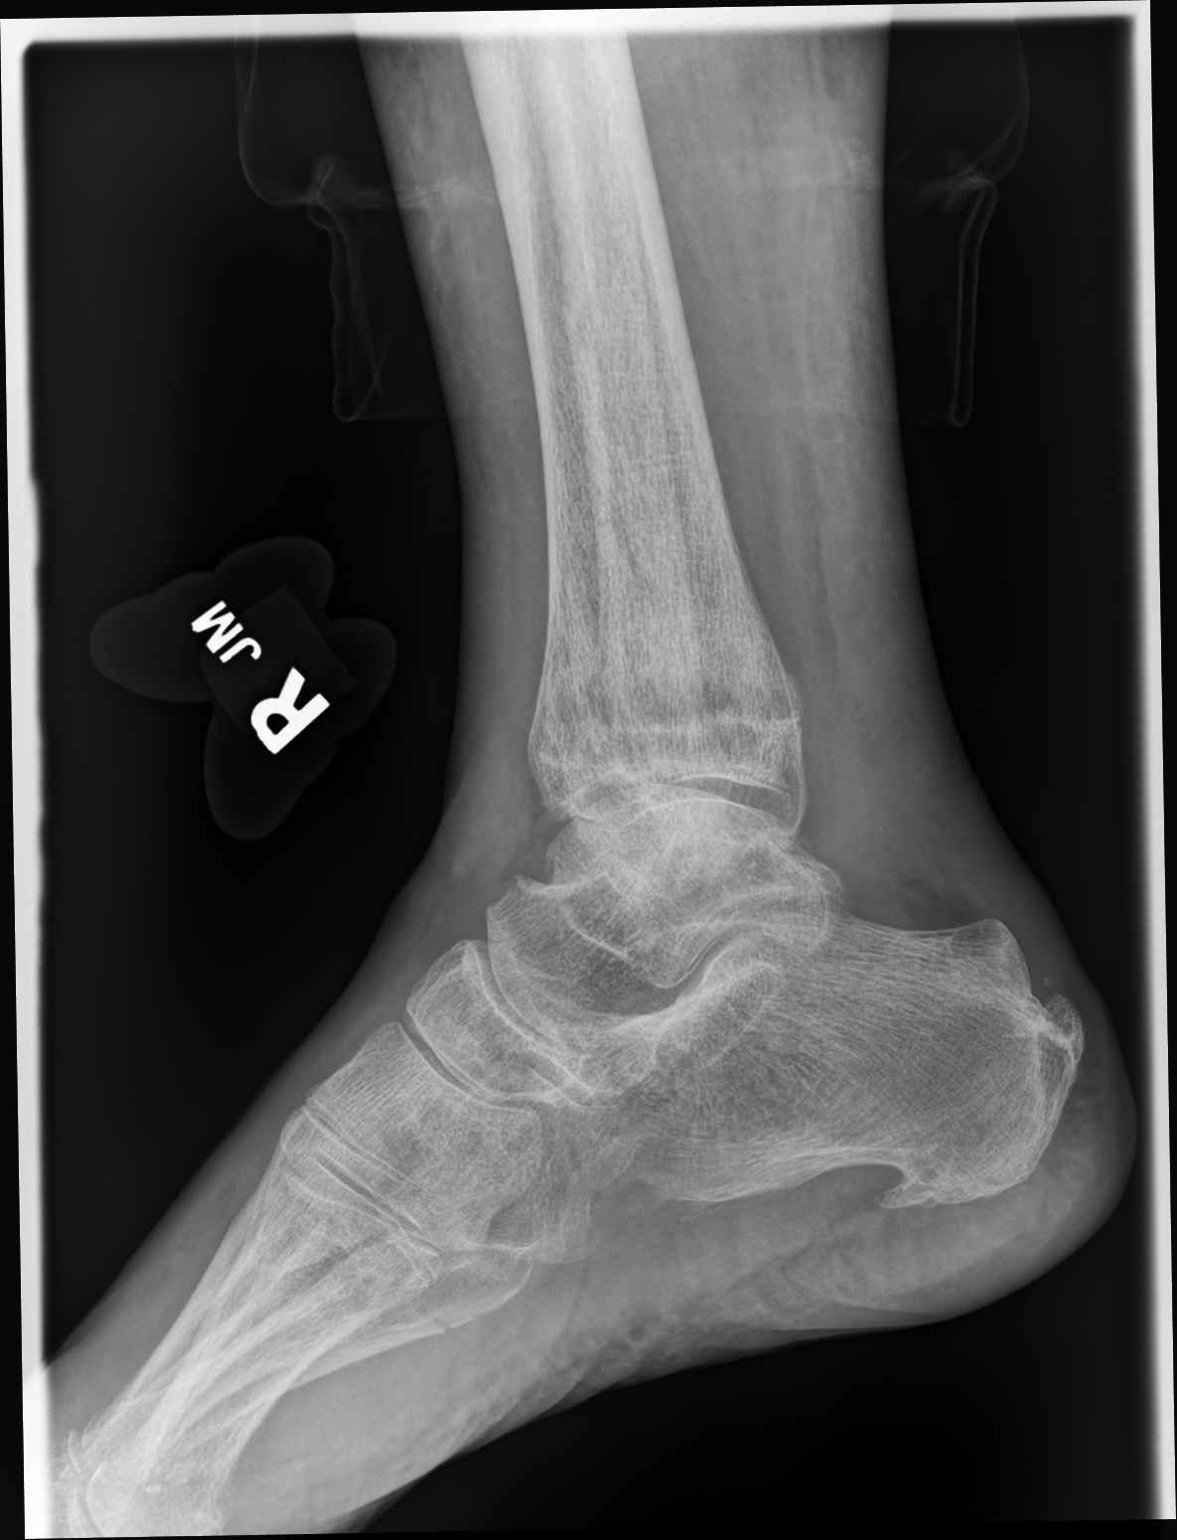

[3 of 3 positions shown; findings below may reference images not displayed]

FINDINGS: Right ankle: Frontal, oblique, lateral views are obtained. There is
marked osteopenia. Transverse fracture at the base of the fifth
metatarsal is partially visualized. No other acute bony
abnormalities. Joint spaces are relatively well preserved. Prominent
superior and inferior calcaneal spurs. Mild diffuse soft tissue
edema.

Right foot: Frontal, oblique, lateral views demonstrate a minimally
displaced transverse fracture at the base of the fifth metatarsal,
with near anatomic alignment. No other acute bony abnormalities. The
bones are diffusely osteopenic. Joint spaces are well preserved.
Mild diffuse soft tissue swelling.
IMPRESSION: 1. Minimally displaced transverse fracture at the base of the fifth
metatarsal.
2. Diffuse soft tissue swelling.
3. Severe osteopenia.

## 2022-07-15 IMAGING — DX DG FOOT COMPLETE 3+V*R*
3 series · 3 of 3 positions shown · non-contrast
Comparison: 08/16/2016

CLINICAL DATA: Fell twice yesterday, bruising and swelling, lateral
foot and ankle pain

EXAM:
RIGHT FOOT COMPLETE - 3+ VIEW; RIGHT ANKLE - COMPLETE 3+ VIEW

[foot supine dp]
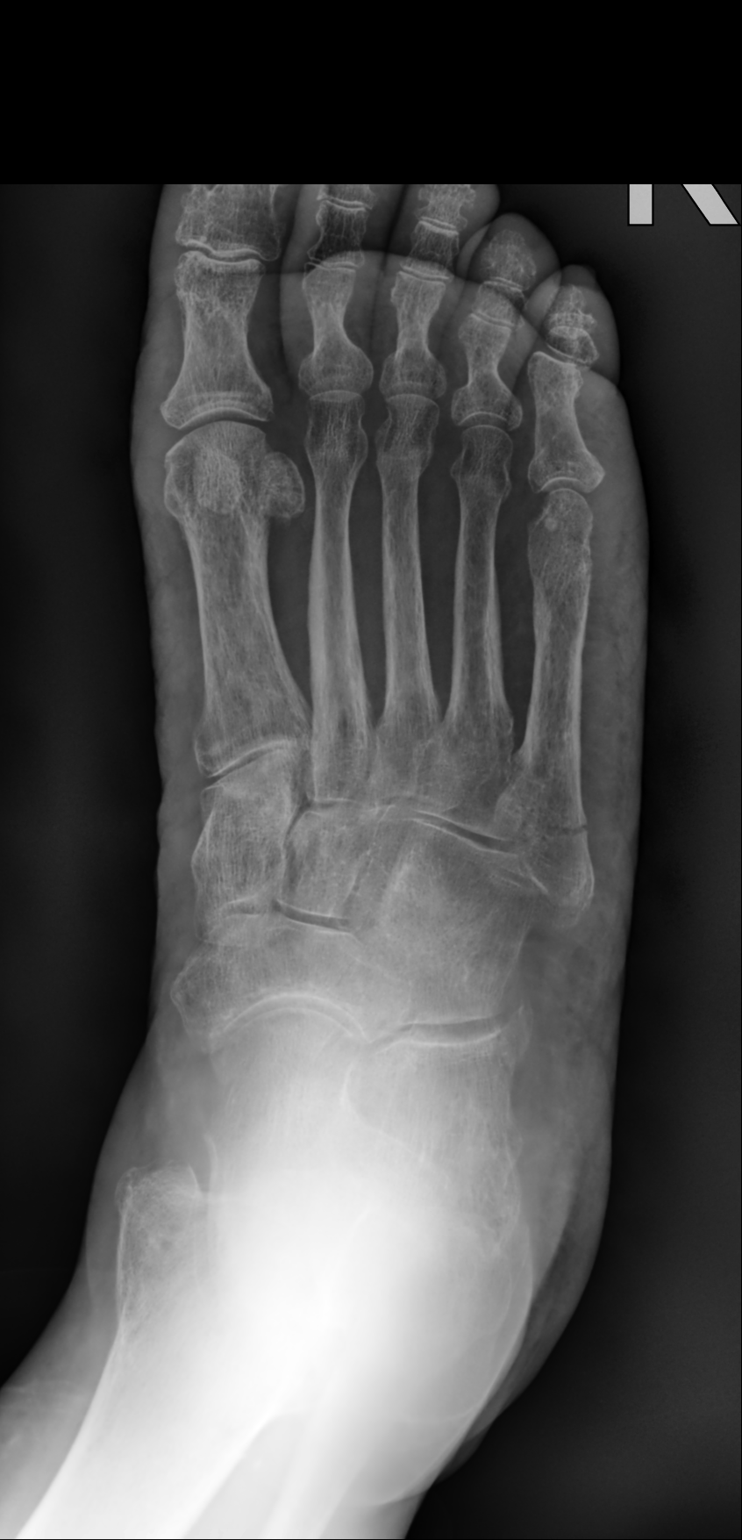

[foot medial oblique]
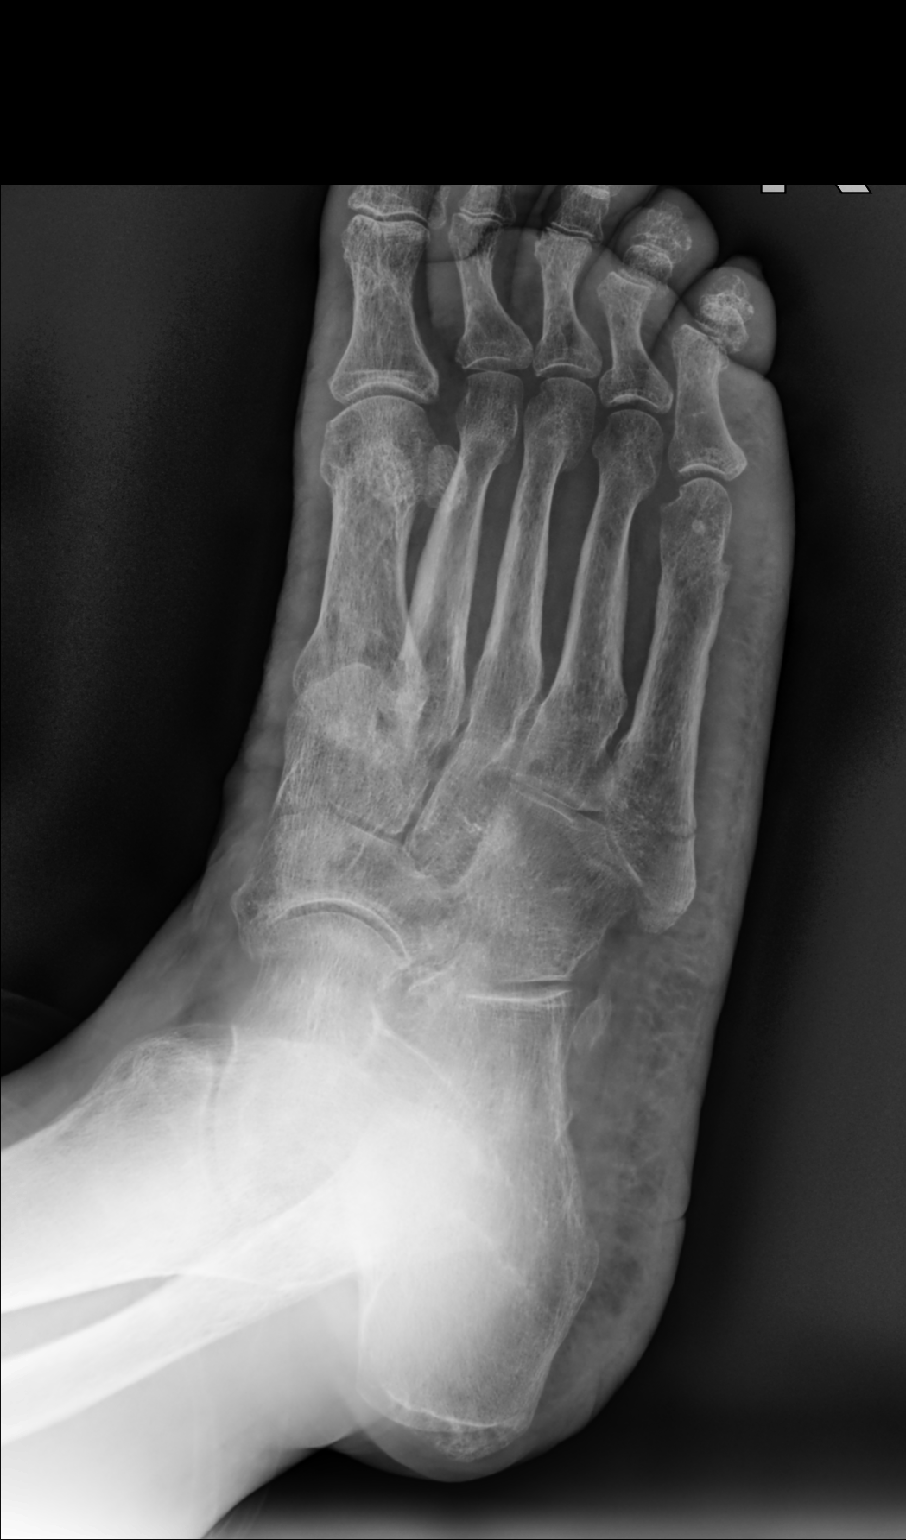

[foot supine lat]
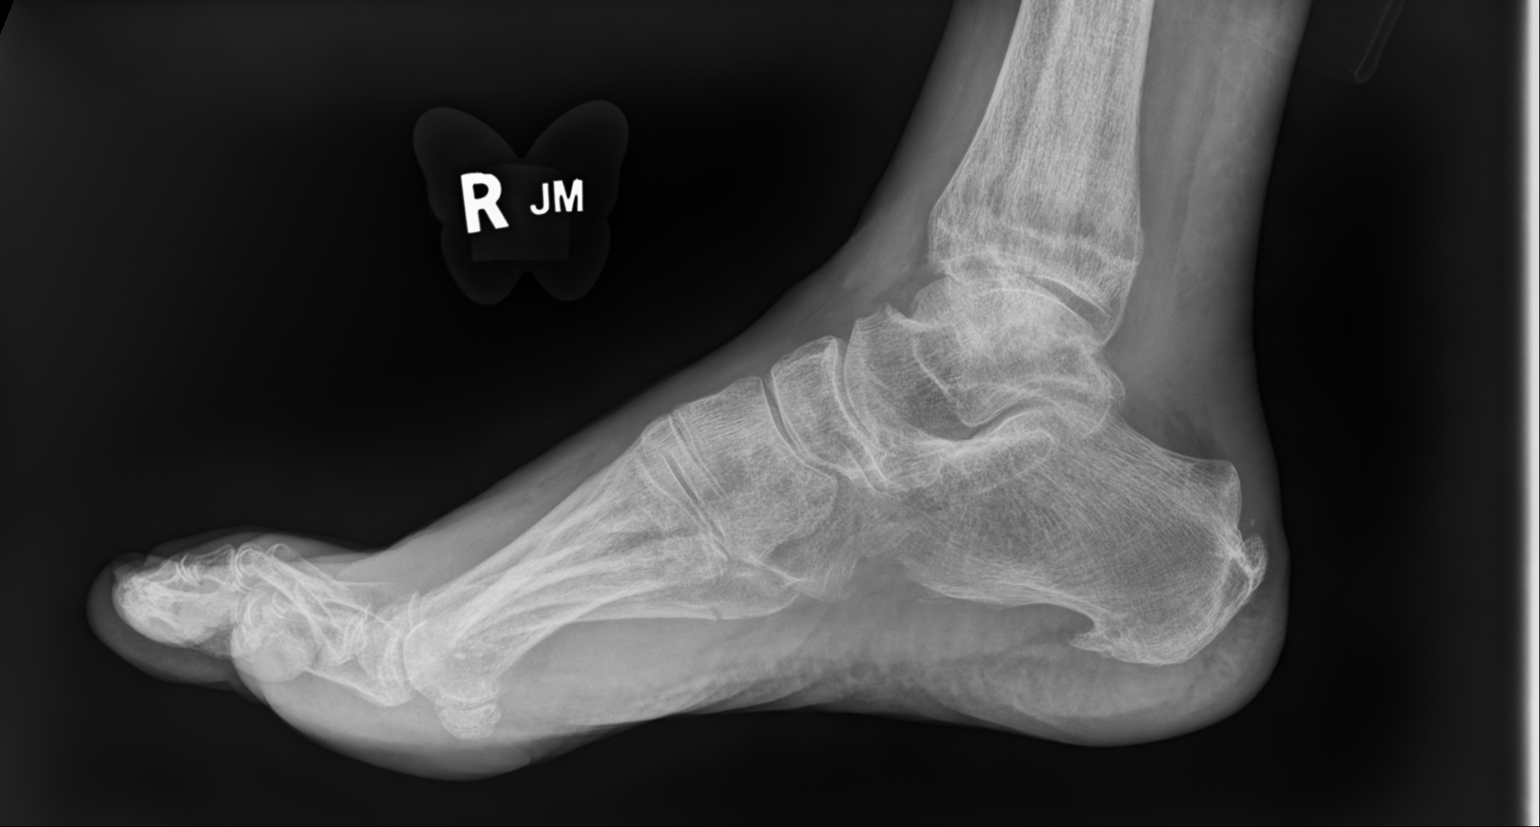

[3 of 3 positions shown; findings below may reference images not displayed]

FINDINGS: Right ankle: Frontal, oblique, lateral views are obtained. There is
marked osteopenia. Transverse fracture at the base of the fifth
metatarsal is partially visualized. No other acute bony
abnormalities. Joint spaces are relatively well preserved. Prominent
superior and inferior calcaneal spurs. Mild diffuse soft tissue
edema.

Right foot: Frontal, oblique, lateral views demonstrate a minimally
displaced transverse fracture at the base of the fifth metatarsal,
with near anatomic alignment. No other acute bony abnormalities. The
bones are diffusely osteopenic. Joint spaces are well preserved.
Mild diffuse soft tissue swelling.
IMPRESSION: 1. Minimally displaced transverse fracture at the base of the fifth
metatarsal.
2. Diffuse soft tissue swelling.
3. Severe osteopenia.

## 2022-07-17 DIAGNOSIS — R0602 Shortness of breath: Secondary | ICD-10-CM | POA: Diagnosis not present

## 2022-07-17 DIAGNOSIS — J441 Chronic obstructive pulmonary disease with (acute) exacerbation: Secondary | ICD-10-CM | POA: Diagnosis not present

## 2022-07-21 ENCOUNTER — Telehealth: Payer: Self-pay | Admitting: Internal Medicine

## 2022-07-21 DIAGNOSIS — R0602 Shortness of breath: Secondary | ICD-10-CM | POA: Diagnosis not present

## 2022-07-21 DIAGNOSIS — J441 Chronic obstructive pulmonary disease with (acute) exacerbation: Secondary | ICD-10-CM | POA: Diagnosis not present

## 2022-07-21 NOTE — Telephone Encounter (Signed)
Patient would like the nurse to call regarding a new script for her oxygen.  She would like a call back asap.  CB# [phone]

## 2022-07-21 NOTE — Telephone Encounter (Signed)
Patient is returning phone call. Patient phone number is [phone].

## 2022-07-21 NOTE — Telephone Encounter (Signed)
Called patient but she did not answer. Left message for patient to call back. Order for oxygen was placed on 2/16.

## 2022-07-24 NOTE — Telephone Encounter (Signed)
Called [facility] and spoke with Anderson Malta. She was able to see the neb order from 02/21-02/22 but not the oxygen order from 02/16. She requested that we resend the oxygen again and she would keep an eye out for the order.   PCCs, can we please resend the oxygen order from 02/16 to [facility]? Thanks!

## 2022-07-24 NOTE — Telephone Encounter (Signed)
Mardene Celeste just called and stated order for poc was received around same as nebulizer order and was not processed correctly.  I was processed today and someone has already called the pt.  Nothing further needed.

## 2022-07-24 NOTE — Telephone Encounter (Signed)
Spoke to [facility] and she had sent order for O2 to be processed.  She states pt has had a poc since 01/14/22.  She has sent a message to followup up regarding concentrator and will let me know when she hears something back.

## 2022-07-24 NOTE — Telephone Encounter (Signed)
Pt calling in to check on Concentrator order. Provided pt with current update. Pls advise Pt on further updates

## 2022-07-25 DIAGNOSIS — J441 Chronic obstructive pulmonary disease with (acute) exacerbation: Secondary | ICD-10-CM | POA: Diagnosis not present

## 2022-07-25 DIAGNOSIS — R0602 Shortness of breath: Secondary | ICD-10-CM | POA: Diagnosis not present

## 2022-08-04 ENCOUNTER — Telehealth: Payer: Self-pay | Admitting: Internal Medicine

## 2022-08-04 NOTE — Telephone Encounter (Signed)
It was just sent for a POC machine on 2 lpm because that was all she required in office during her walk. She can increase it to 3 lpm to maintain above 88-90% at home. The machine they give her will still be adjustable so the order doesn't impact this. It is fine to change it to 2-3 lpm though. Thanks.

## 2022-08-04 NOTE — Telephone Encounter (Signed)
The POC order was also for 1-5 lpm; I just double checked. If [facility] needs a new order, that's fine but the previous order included a large home concentrator. Thanks!

## 2022-08-04 NOTE — Telephone Encounter (Signed)
[name] can you please advise. Patient states liter flow is not correct on order that was sent to [facility] she states the order is for 2L and she states order should be for 3. She is really upset this is not correct and states this has been an on going problem.

## 2022-08-04 NOTE — Telephone Encounter (Signed)
Can I send in a new order for a concentrator with Liter flow on it. This is not what she told me previously.......Marland Kitchen

## 2022-08-04 NOTE — Telephone Encounter (Signed)
Patient would like to speak to Dr. Shearon Stalls. Patient phone number is [phone].

## 2022-08-07 NOTE — Telephone Encounter (Signed)
Left message for patient to call back  

## 2022-08-09 ENCOUNTER — Telehealth: Payer: Self-pay | Admitting: Nurse Practitioner

## 2022-08-09 ENCOUNTER — Telehealth: Payer: Self-pay | Admitting: Internal Medicine

## 2022-08-09 DIAGNOSIS — J449 Chronic obstructive pulmonary disease, unspecified: Secondary | ICD-10-CM

## 2022-08-09 DIAGNOSIS — J9611 Chronic respiratory failure with hypoxia: Secondary | ICD-10-CM

## 2022-08-09 NOTE — Telephone Encounter (Signed)
Melissa from [facility] called me asking for a new 02 start order for this patient. Since she is transferring from [place] to [place].  They will need new 02 start order for the 02.  The only order they have right now is POC order

## 2022-08-09 NOTE — Telephone Encounter (Signed)
New order placed to [facility] for oxygen. Community message to [place] from [employer]

## 2022-08-09 NOTE — Telephone Encounter (Signed)
Pt would like to speak to Dr. Shearon Stalls or her head nurse in regards to her O2 order. I did advise Pt that Rodena Piety is working with Lenna Sciara to get her oxygen order corrected.

## 2022-08-09 NOTE — Telephone Encounter (Signed)
I spoke to the patient and she was very upset that she has spoken to a number of people at [place] regarding her home oxygen concentrator since 07/16/2022. I assured her I have sent a message to our oxygen rep directly and there should not be anymore miscommunication. New order for home concentrator on 3L placed. Pt verbalized understanding nothing further needed.

## 2022-08-10 NOTE — Telephone Encounter (Signed)
Since messaged is being handle in another encounter. Closing this one

## 2022-08-16 ENCOUNTER — Telehealth: Payer: Self-pay | Admitting: Cardiology

## 2022-08-16 DIAGNOSIS — J9611 Chronic respiratory failure with hypoxia: Secondary | ICD-10-CM

## 2022-08-16 DIAGNOSIS — I739 Peripheral vascular disease, unspecified: Secondary | ICD-10-CM

## 2022-08-16 DIAGNOSIS — I5032 Chronic diastolic (congestive) heart failure: Secondary | ICD-10-CM

## 2022-08-16 DIAGNOSIS — G6281 Critical illness polyneuropathy: Secondary | ICD-10-CM

## 2022-08-17 NOTE — Telephone Encounter (Signed)
I placed home health orders. Do not know Neurology in [place] area. Also do not know reason for referral. I do not have anyone I can refer to PCP, she has to find who is taking patients and go there

## 2022-08-17 NOTE — Telephone Encounter (Signed)
ICD-10-CM   1. Chronic hypoxemic respiratory failure ([place])  J96.11 Ambulatory referral to Home Health    2. Chronic diastolic CHF (congestive heart failure) ([place])  I50.32 Ambulatory referral to Home Health    3. PAD (peripheral artery disease) (HCC)  I73.9 Ambulatory referral to Home Health    4. Polyneuropathy associated with critical illness ([place])  G62.81 Ambulatory referral to [facility] This Encounter  Procedures   Ambulatory referral to Home Health    Referral Priority:   Routine    Referral Type:   Home Health Care    Referral Reason:   Specialty Services Required    Requested Specialty:   [place]    Number of Visits Requested:   1

## 2022-10-25 ENCOUNTER — Ambulatory Visit: Payer: 59 | Admitting: Cardiology

## 2022-10-25 ENCOUNTER — Encounter: Payer: Self-pay | Admitting: Cardiology

## 2022-10-25 VITALS — BP 140/80 | HR 76 | Resp 18 | Ht <= 58 in | Wt 116.8 lb

## 2022-10-25 DIAGNOSIS — I5032 Chronic diastolic (congestive) heart failure: Secondary | ICD-10-CM

## 2022-10-25 DIAGNOSIS — I10 Essential (primary) hypertension: Secondary | ICD-10-CM

## 2022-10-25 DIAGNOSIS — I739 Peripheral vascular disease, unspecified: Secondary | ICD-10-CM

## 2022-10-25 DIAGNOSIS — G6281 Critical illness polyneuropathy: Secondary | ICD-10-CM

## 2022-10-25 DIAGNOSIS — G2581 Restless legs syndrome: Secondary | ICD-10-CM

## 2022-10-25 NOTE — Progress Notes (Signed)
Primary Physician/Referring:  Ardyth Gal, MD  Patient ID: [name], female    DOB: 1945-06-05, 77 y.o.   MRN: [id number]  No chief complaint on file.   HPI: [name]  is a 77 y.o. female  with peripheral artery disease has known long left SFA CTO with failed attempt in September 2020 for revascularization, also has right calcific iliac artery stenosis which was felt to be high risk due to extension into right common femoral artery for dissection. She has right hip claudication and also left leg claudication that is chronic but has remained stable.  Past medical history significant for tobacco use disorder, which she quit on 10/21/2019, but started smoking about 2 weeks ago 10/29/21, hypertension, COPD,Stage III chronic kidney disease, history of breast cancer, bilateral venous insufficiency and has had ablation and persistent superficial varicose veins with complications and recurrent varicose vein bleeding and h/o  UGI bleed on 09/18/2021 needing blood transfusion, EGD revealing nonbleeding gastric ulcer, acute gastritis and medium-sized hiatal hernia.    Patient presents for 6 month follow-up visit. Except for chronic dyspnea she has no other specific complaints and denied any claudication, chest pain.  Dyspnea is stable and cough is stable.   Past Medical History:  Diagnosis Date   Acute renal failure superimposed on stage 3 chronic kidney disease (HCC) 10/27/2014   Back pain, chronic    Breast cancer (HCC) 09/09/2010   Cancer of colon (HCC) 05/24/2011   Centrilobular emphysema (HCC) 07/15/2018   CKD (chronic kidney disease), stage III (HCC)    Claudication, intermittent (HCC) 07/15/2018   Depression    GIB (gastrointestinal bleeding)    H/O tobacco use, presenting hazards to health Quit Dec 2019 05/03/2011   50 years, up to 3PPD quit last year.    Hx of varicose veins of lower extremity 07/15/2018   Hyperlipidemia    Hypertension    SDH (subdural hematoma) (HCC)     SOB (shortness of breath) 07/15/2018    Social History   Tobacco Use   Smoking status: Former    Packs/day: 0.25    Years: 30.00    Additional pack years: 0.00    Total pack years: 7.50    Types: Cigarettes    Quit date: 10/31/2019    Years since quitting: 2.9   Smokeless tobacco: Never   Tobacco comments:    had 6 cigarettes recently 03-10-20  Substance Use Topics   Alcohol use: Yes    Comment: rarely   Marital Status: Divorced   Review of Systems  Constitutional: Positive for malaise/fatigue.  Cardiovascular:  Positive for claudication (very mild) and dyspnea on exertion. Negative for chest pain, leg swelling and orthopnea.   Objective  Blood pressure (!) 140/80, pulse 76, resp. rate 18, height 4\' 8"  (1.422 m), weight 116 lb 12.8 oz (53 kg), SpO2 96 %. Body mass index is 26.19 kg/m.     10/25/2022    4:00 PM 10/25/2022    3:57 PM 07/07/2022    1:23 PM  Vitals with BMI  Height  4\' 8"  4\' 8"   Weight  116 lbs 13 oz 118 lbs  BMI  26.2 26.47  Systolic 140 208 409  Diastolic 80 102 66  Pulse 76 92 63     No data found. Physical Exam Vitals reviewed.  Constitutional:      Appearance: She is well-developed.  Neck:     Vascular: No carotid bruit or JVD.  Cardiovascular:     Rate and Rhythm:  Normal rate and regular rhythm. No extrasystoles are present.    Pulses:          Femoral pulses are 2+ on the right side with bruit.      Popliteal pulses are 1+ on the right side and 1+ on the left side.       Dorsalis pedis pulses are 1+ on the right side and 0 on the left side.       Posterior tibial pulses are 0 on the left side.     Heart sounds: Normal heart sounds.  Pulmonary:     Effort: Pulmonary effort is normal. No accessory muscle usage or respiratory distress.     Breath sounds: Rales (Occasional scattered) present. No wheezing.  Musculoskeletal:     Right lower leg: Edema (1-2+ pitting) present.     Left lower leg: Edema (1-2+ pitting) present.  Skin:    Capillary  Refill: Capillary refill takes less than 2 seconds.     Findings: No erythema.  Neurological:     Comments: Patient is somnolent on exam and appears mildly confused     Radiology: No results found.  Laboratory examination:       Latest Ref Rng & Units 11/04/2021    2:47 PM 10/25/2021   12:15 PM 09/21/2021    5:29 AM  CMP  Glucose 70 - 99 mg/dL 191  478  295   BUN 8 - 27 mg/dL 27  37  32   Creatinine 0.57 - 1.00 mg/dL 6.21  3.08  6.57   Sodium 134 - 144 mmol/L 143  142  142   Potassium 3.5 - 5.2 mmol/L 3.8  3.6  4.6   Chloride 96 - 106 mmol/L 101  101  107   CO2 20 - 29 mmol/L 26  29  30    Calcium 8.7 - 10.3 mg/dL 84.6  96.2  95.2   Total Protein 6.5 - 8.1 g/dL  6.6    Total Bilirubin 0.3 - 1.2 mg/dL  0.7    Alkaline Phos 38 - 126 U/L  79    AST 15 - 41 U/L  31    ALT 0 - 44 U/L  19        Latest Ref Rng & Units 10/25/2021   12:15 PM 09/22/2021    5:29 AM 09/21/2021    6:21 PM  CBC  WBC 4.0 - 10.5 K/uL 3.7  3.0    Hemoglobin 12.0 - 15.0 g/dL 84.1  8.6  9.1   Hematocrit 36.0 - 46.0 % 35.6  27.0  28.6   Platelets 150 - 400 K/uL 184  121     External labs:  Labs 10/04/2021:  Hb 9.7/HCT 29.6, platelets 245, normal indicis.  BUN 28, creatinine 1.61, EGFR 33 mL, potassium 4.2.  LFTs normal.  Labs 03/03/2021:  Hb 12.3/HCT 36.7, platelets 216.  Normal indicis.  Serum glucose 82 mg, BUN 28, creatinine 1.33, EGFR 42 mL, potassium 4.1, LFTs normal.  Magnesium 2.0.  Lipid profile 07/28/2020:  Total cholesterol 149, triglycerides 74, HDL 78, LDL 57.  Cardiac Studies:   Lower Extremity Arterial Duplex 01/14/2019: There is monophasic waveform noted in the right external iliac and CFA suggests proximal significant disease. There is severe diffuse mixed plaque noted throughout the right lower extremity.  Moderate velocity increase at the left distal superficial femoral artery, >50% stenosis. There is severe diffuse mixed plaque throughout the left lower extremity This exam reveals  moderately decreased perfusion of the right lower extremity,  noted at the dorsalis pedis artery level (ABI 0.75) and moderately decreased perfusion of the left lower extremity, noted at the post tibial artery level (ABI 0.58).  No significant change from report of 01/11/2015.   Peripheral arteriogram 03/18/2019:  Dist R EIA to Prox R CFA lesion is 60% stenosed.  Prox R CIA to Mid R EIA lesion is 20% stenosed.  Prox L CIA to Mid L CFA lesion is 20% stenosed.  Prox L SFA to Dist L SFA lesion is 100% stenosed.  Three-vessel runoff below the knee on the left with brisk flow.   Failed attempt at angioplasty of the left SFA.    Suprarenal Abd AO to Infrarenal Abd AO lesion is 30% stenosed. Severely calcified lesions.  Abdominal aortogram also reveals patent renal arteries 1 on either side. There is no evidence of abdominal aortic aneurysm.  PCV ECHOCARDIOGRAM COMPLETE 11/04/2021  Narrative Echocardiogram 11/04/2021: Left ventricle cavity is normal in size and wall thickness. Normal global wall motion. Normal LV systolic function with visual EF 50-55%. Doppler evidence of grade I (impaired) diastolic dysfunction, normal LAP. Left atrial cavity is moderately dilated. Structurally normal trileaflet aortic valve.  Trace aortic regurgitation. Mild to moderate mitral regurgitation. Mild to moderate tricuspid regurgitation. Estimated pulmonary artery systolic pressure 38 mmHg. Compared to previous study in 2020, trace aortic regurgitation is new. No other significant change noted.   EKG:  EKG 09/02/2022: Normal sinus rhythm at rate of 67 bpm with sinus arrhythmia.  Normal axis.  Right bundle branch block.  PACs. No significant change from EKG 11/16/2020.   Allergies & Medications   No Known Allergies   Current Outpatient Medications:    Acetaminophen (TYLENOL EXTRA STRENGTH PO), Take by mouth., Disp: , Rfl:    ALPRAZolam (XANAX) 1 MG tablet, Take 1 tablet (1 mg total) by mouth 2 (two)  times daily as needed., Disp: 30 tablet, Rfl: 0   amLODipine (NORVASC) 5 MG tablet, Take 1 tablet (5 mg total) by mouth daily., Disp: 90 tablet, Rfl: 3   atorvastatin (LIPITOR) 10 MG tablet, Take 1 tablet (10 mg total) by mouth daily., Disp: 90 tablet, Rfl: 3   buprenorphine (BUTRANS) 10 MCG/HR PTWK, Place 1 patch onto the skin once a week., Disp: , Rfl:    furosemide (LASIX) 20 MG tablet, Take 1 tablet (20 mg total) by mouth daily as needed for fluid or edema., Disp: 90 tablet, Rfl: 3   gabapentin (NEURONTIN) 300 MG capsule, Take 1 capsule (300 mg total) by mouth 3 (three) times daily., Disp: 270 capsule, Rfl: 3   ipratropium-albuterol (DUONEB) 0.5-2.5 (3) MG/3ML SOLN, Take 3 mLs by nebulization every 6 (six) hours as needed., Disp: 360 mL, Rfl: 6   losartan-hydrochlorothiazide (HYZAAR) 100-12.5 MG tablet, Take 1 tablet by mouth daily., Disp: 90 tablet, Rfl: 3   Multiple Vitamins-Minerals (MULTIVITAMIN ADULT EXTRA C) CHEW, , Disp: , Rfl:    naproxen sodium (ALEVE) 220 MG tablet, Take 220 mg by mouth daily as needed (Arthritis)., Disp: , Rfl:    potassium chloride (KLOR-CON) 10 MEQ tablet, Take 1 tablet (10 mEq total) by mouth daily as needed (With Furosemide). Take with lasix, Disp: 90 tablet, Rfl: 3   rOPINIRole (REQUIP) 1 MG tablet, Take 1 tablet (1 mg total) by mouth 3 (three) times daily. (Patient taking differently: Take 1 mg by mouth at bedtime. Take 1 extra on days with worse symptoms), Disp: 90 tablet, Rfl: 3   Assessment     ICD-10-CM   1. Chronic diastolic  CHF (congestive heart failure) (HCC)  I50.32     2. Primary hypertension  I10     3. PAD (peripheral artery disease) (HCC)  I73.9     4. Polyneuropathy associated with critical illness (HCC)  G62.81 Ambulatory referral to Neurology    5. Restless leg  G25.81        No orders of the defined types were placed in this encounter.  There are no discontinued medications.    Orders Placed This Encounter  Procedures    Ambulatory referral to Neurology    Referral Priority:   Routine    Referral Type:   Consultation    Referral Reason:   Specialty Services Required    Referral Location[facility]   [facility]    Referred to Provider:   Morene Crocker, MD    Requested Specialty:   Neurology    Number of Visits Requested:   1    Recommendations:   [name]  is a 77 y.o. with peripheral artery disease has known long left SFA CTO with failed attempt in September 2020 for revascularization, also has right calcific iliac artery stenosis which was felt to be high risk due to extension into right common femoral artery for dissection. She has right hip claudication and also left leg claudication that is chronic but has remained stable.  Past medical history significant for tobacco use disorder, which she quit on 10/21/2019, but started smoking about 2 weeks ago 10/29/21, hypertension, COPD,Stage III chronic kidney disease, history of breast cancer, bilateral venous insufficiency and has had ablation and persistent superficial varicose veins with complications and recurrent varicose vein bleeding and h/o  UGI bleed on 09/18/2021 needing blood transfusion, EGD revealing nonbleeding gastric ulcer, acute gastritis and medium-sized hiatal hernia.    1. Chronic diastolic CHF (congestive heart failure) (HCC) Patient is remained stable from heart status.  There is no clinical evidence of heart failure.  2. Primary hypertension Blood pressure was elevated today however blood pressure usually under very good control.  She is under distress today she is planning on moving into an assisted living facility, finally has come to regularization that she needs more help than she thought.  I encouraged her to look into assisted living facilities while she is still relatively healthy.  3. PAD (peripheral artery disease) (HCC) From PAD standpoint there are no open wounds, conservative therapy only.  4.  Polyneuropathy associated with critical illness [facility]) Patient has severe polyneuropathy associated with critical illness with multiple hospitalization in the past, however she has remained stable, she would like to have a neurology referral which has been placed today. - Ambulatory referral to Neurology  5. Restless leg Patient is presently on metoprolol, on increasing the dose to 2 mg she had developed extraparametal symptoms.  Advised her that she can try 1.5 tablets and see if that would help and she would also alternate 1 tablet and 1.5 tablet every other day.  Patient has finally established with PCP, advised her that she should also establish with a primary cardiologist close by to where she is living but I am happy to continue to see her.  I have been prescribing her all her medications, I will request PCP to take over.  There is no abuse potential.  Other orders - buprenorphine (BUTRANS) 10 MCG/HR PTWK; Place 1 patch onto the skin once a week. - naproxen sodium (ALEVE) 220 MG tablet; Take 220 mg by mouth daily as needed (Arthritis). - Acetaminophen (TYLENOL  EXTRA STRENGTH PO); Take by mouth.     Yates Decamp, MD, [facility] 10/29/2022, 8:01 AM Office: [phone] Fax: [phone] Pager: [phone]

## 2022-10-31 ENCOUNTER — Telehealth: Payer: Self-pay

## 2022-10-31 NOTE — Telephone Encounter (Signed)
Patient called and stated that you recently had her start Losartan -HCTZ and it is making her go to the bathroom every 20-30 minutes and she cannot walk fast enough to get tot he bathroom because of the location of the bathroom in her home. She would like to know if she can only take it as needed like her Furosemide and just keep elevating her feet. Please advise.   Also she wants to know if you sent the referral to home health for her? Please advise.

## 2022-10-31 NOTE — Telephone Encounter (Signed)
Sure

## 2022-11-03 NOTE — Telephone Encounter (Signed)
I had made neuro consult through Epic. Please check

## 2022-11-03 NOTE — Telephone Encounter (Signed)
See last message

## 2022-11-03 NOTE — Telephone Encounter (Signed)
Patient wants to know if the referral to neurologist in [place] has been sent. I tried to get her to call her PCP about home health, but she said that they

## 2022-11-15 ENCOUNTER — Other Ambulatory Visit: Payer: Self-pay

## 2022-11-15 DIAGNOSIS — I10 Essential (primary) hypertension: Secondary | ICD-10-CM

## 2022-11-15 MED ORDER — LOSARTAN POTASSIUM-HCTZ 100-12.5 MG PO TABS
1.0000 | ORAL_TABLET | Freq: Every day | ORAL | 3 refills | Status: DC
Start: 2022-11-15 — End: 2023-02-26

## 2022-11-21 ENCOUNTER — Other Ambulatory Visit: Payer: Self-pay | Admitting: Neurology

## 2022-11-21 DIAGNOSIS — R202 Paresthesia of skin: Secondary | ICD-10-CM

## 2022-11-21 DIAGNOSIS — R609 Edema, unspecified: Secondary | ICD-10-CM

## 2022-12-06 ENCOUNTER — Ambulatory Visit
Admission: RE | Admit: 2022-12-06 | Discharge: 2022-12-06 | Disposition: A | Discharge status: Home / Self Care | Payer: 59 | Source: Ambulatory Visit | Attending: Neurology | Admitting: Neurology

## 2022-12-06 DIAGNOSIS — R202 Paresthesia of skin: Secondary | ICD-10-CM | POA: Insufficient documentation

## 2022-12-06 DIAGNOSIS — R609 Edema, unspecified: Secondary | ICD-10-CM | POA: Diagnosis present

## 2022-12-12 ENCOUNTER — Encounter: Payer: Self-pay | Admitting: Hematology

## 2023-01-03 ENCOUNTER — Ambulatory Visit: Payer: 59 | Admitting: Cardiology

## 2023-01-10 ENCOUNTER — Encounter: Payer: Self-pay | Admitting: Hematology

## 2023-01-15 ENCOUNTER — Ambulatory Visit: Payer: 59 | Admitting: Cardiology

## 2023-01-16 NOTE — Telephone Encounter (Signed)
A user error has taken place: encounter opened in error, closed for administrative reasons.

## 2023-01-17 ENCOUNTER — Ambulatory Visit: Payer: 59 | Admitting: Cardiology

## 2023-01-17 ENCOUNTER — Encounter: Payer: Self-pay | Admitting: Cardiology

## 2023-01-17 VITALS — BP 130/50 | HR 72 | Resp 18 | Ht <= 58 in | Wt 142.0 lb

## 2023-01-17 DIAGNOSIS — I739 Peripheral vascular disease, unspecified: Secondary | ICD-10-CM

## 2023-01-17 DIAGNOSIS — I878 Other specified disorders of veins: Secondary | ICD-10-CM

## 2023-01-17 DIAGNOSIS — I5032 Chronic diastolic (congestive) heart failure: Secondary | ICD-10-CM

## 2023-01-17 DIAGNOSIS — L03116 Cellulitis of left lower limb: Secondary | ICD-10-CM

## 2023-01-17 DIAGNOSIS — I10 Essential (primary) hypertension: Secondary | ICD-10-CM

## 2023-01-17 MED ORDER — CEPHALEXIN 500 MG PO CAPS
500.0000 mg | ORAL_CAPSULE | Freq: Four times a day (QID) | ORAL | 0 refills | Status: DC
Start: 2023-01-17 — End: 2023-02-26

## 2023-01-17 NOTE — Progress Notes (Signed)
Primary Physician/Referring:  Ardyth Gal, MD  Patient ID: [name], female    DOB: Oct 25, 1945, 77 y.o.   MRN: [id number]  Chief Complaint  Patient presents with   PAD (peripheral artery disease)    Chronic diastolic CHF (congestive heart failure)    Bilateral leg edema   Follow-up    6 months    HPI: [name]  is a 77 y.o. female  with peripheral artery disease has known long left SFA CTO with failed attempt in September 2020 for revascularization, also has right calcific iliac artery stenosis which was felt to be high risk due to extension into right common femoral artery for dissection. She has right hip claudication and also left leg claudication that is chronic but has remained stable.  Past medical history significant for tobacco use disorder, which she quit on 10/21/2019, but started smoking about 2 weeks ago 10/29/21, hypertension, COPD,Stage III chronic kidney disease, history of breast cancer, bilateral venous insufficiency and has had ablation and persistent superficial varicose veins with complications and recurrent varicose vein bleeding and h/o  UGI bleed on 09/18/2021 needing blood transfusion, EGD revealing nonbleeding gastric ulcer, acute gastritis and medium-sized hiatal hernia.  She now presents here for follow-up of PAD, hypertension and chronic diastolic heart failure.  Patient presents for 6 month follow-up visit.  She has noticed worsening leg edema over the past 4 to 6 weeks.  Dyspnea is stable and cough is stable.  No chest pain or palpitations.   Past Medical History:  Diagnosis Date   Acute renal failure superimposed on stage 3 chronic kidney disease (HCC) 10/27/2014   Back pain, chronic    Breast cancer (HCC) 09/09/2010   Cancer of colon (HCC) 05/24/2011   Centrilobular emphysema (HCC) 07/15/2018   CKD (chronic kidney disease), stage III (HCC)    Claudication, intermittent (HCC) 07/15/2018   Depression    GIB (gastrointestinal bleeding)    H/O  tobacco use, presenting hazards to health Quit Dec 2019 05/03/2011   50 years, up to 3PPD quit last year.    Hx of varicose veins of lower extremity 07/15/2018   Hyperlipidemia    Hypertension    SDH (subdural hematoma) (HCC)    SOB (shortness of breath) 07/15/2018    Social History   Tobacco Use   Smoking status: Former    Current packs/day: 0.00    Average packs/day: 0.3 packs/day for 30.0 years (7.5 ttl pk-yrs)    Types: Cigarettes    Start date: 10/30/1989    Quit date: 10/31/2019    Years since quitting: 3.2   Smokeless tobacco: Never   Tobacco comments:    had 6 cigarettes recently 03-10-20  Substance Use Topics   Alcohol use: Yes    Comment: rarely   Marital Status: Divorced   Review of Systems  Cardiovascular:  Positive for claudication (very mild), dyspnea on exertion and leg swelling. Negative for chest pain and orthopnea.   Objective  Blood pressure (!) 130/50, pulse 72, resp. rate 18, height 4\' 8"  (1.422 m), weight 142 lb (64.4 kg), SpO2 92%. Body mass index is 31.84 kg/m.     01/17/2023    1:40 PM 01/17/2023    1:24 PM 01/17/2023    1:09 PM  Vitals with BMI  Height   4\' 8"   Weight   142 lbs  BMI   31.85  Systolic 130 159 338  Diastolic 50 52 66  Pulse  72 68     Orthostatic VS  for the past 72 hrs (Last 3 readings):  Patient Position BP Location Cuff Size  01/17/23 1324 Sitting Left Arm Normal  01/17/23 1309 Sitting Left Arm Normal   Physical Exam Vitals reviewed.  Constitutional:      Appearance: She is well-developed.  Neck:     Vascular: No carotid bruit or JVD.  Cardiovascular:     Rate and Rhythm: Normal rate and regular rhythm. No extrasystoles are present.    Pulses:          Dorsalis pedis pulses are 1+ on the right side and 1+ on the left side.       Posterior tibial pulses are 0 on the right side and 0 on the left side.     Heart sounds: Normal heart sounds.  Pulmonary:     Effort: Pulmonary effort is normal. No accessory muscle usage  or respiratory distress.     Breath sounds: Rales (Occasional scattered) present. No wheezing.  Abdominal:     General: Bowel sounds are normal.     Palpations: Abdomen is soft.  Musculoskeletal:     Right lower leg: Edema (2+ pitting) present.     Left lower leg: Edema (2+ pitting, cellulitis noted) present.  Skin:    Capillary Refill: Capillary refill takes less than 2 seconds.     Findings: No erythema.    Radiology: No results found.  Laboratory examination:   External labs:  Labs 10/02/2022:  Sodium 145, potassium 4.4, BUN 27, creatinine 1.02, EGFR 57 mL.  Hb 11.1/HCT 34.0, platelets 245.  Microcytic indicis.  TSH normal at 0.840.  A1c 5.8%.  Total cholesterol 177, triglycerides 76, HDL 100, LDL 62.   Cardiac Studies:   Lower Extremity Arterial Duplex 01/14/2019: There is monophasic waveform noted in the right external iliac and CFA suggests proximal significant disease. There is severe diffuse mixed plaque noted throughout the right lower extremity.  Moderate velocity increase at the left distal superficial femoral artery, >50% stenosis. There is severe diffuse mixed plaque throughout the left lower extremity This exam reveals moderately decreased perfusion of the right lower extremity, noted at the dorsalis pedis artery level (ABI 0.75) and moderately decreased perfusion of the left lower extremity, noted at the post tibial artery level (ABI 0.58).  No significant change from report of 01/11/2015.   Peripheral arteriogram 03/18/2019:  Dist R EIA to Prox R CFA lesion is 60% stenosed.  Prox R CIA to Mid R EIA lesion is 20% stenosed.  Prox L CIA to Mid L CFA lesion is 20% stenosed.  Prox L SFA to Dist L SFA lesion is 100% stenosed.  Three-vessel runoff below the knee on the left with brisk flow.   Failed attempt at angioplasty of the left SFA.    Suprarenal Abd AO to Infrarenal Abd AO lesion is 30% stenosed. Severely calcified lesions.  Abdominal aortogram  also reveals patent renal arteries 1 on either side. There is no evidence of abdominal aortic aneurysm.  PCV ECHOCARDIOGRAM COMPLETE 11/04/2021  Narrative Echocardiogram 11/04/2021: Left ventricle cavity is normal in size and wall thickness. Normal global wall motion. Normal LV systolic function with visual EF 50-55%. Doppler evidence of grade I (impaired) diastolic dysfunction, normal LAP. Left atrial cavity is moderately dilated. Structurally normal trileaflet aortic valve.  Trace aortic regurgitation. Mild to moderate mitral regurgitation. Mild to moderate tricuspid regurgitation. Estimated pulmonary artery systolic pressure 38 mmHg. Compared to previous study in 2020, trace aortic regurgitation is new. No other significant change noted.  EKG:  EKG 01/17/2023: Normal sinus rhythm with rate of 65 bpm, right bundle branch block.  No significant change from 07/04/2022, PACs (2) not present.  Allergies & Medications   No Known Allergies   Current Outpatient Medications:    Acetaminophen (TYLENOL EXTRA STRENGTH PO), Take by mouth., Disp: , Rfl:    ALPRAZolam (XANAX) 1 MG tablet, Take 1 tablet (1 mg total) by mouth 2 (two) times daily as needed., Disp: 30 tablet, Rfl: 0   amLODipine (NORVASC) 5 MG tablet, Take 1 tablet (5 mg total) by mouth daily., Disp: 90 tablet, Rfl: 3   atorvastatin (LIPITOR) 10 MG tablet, Take 1 tablet (10 mg total) by mouth daily., Disp: 90 tablet, Rfl: 3   buprenorphine (BUTRANS) 10 MCG/HR PTWK, Place 1 patch onto the skin once a week., Disp: , Rfl:    cephALEXin (KEFLEX) 500 MG capsule, Take 1 capsule (500 mg total) by mouth 4 (four) times daily., Disp: 40 capsule, Rfl: 0   cyanocobalamin (VITAMIN B12) 1000 MCG tablet, Take 1,000 mcg by mouth daily., Disp: , Rfl:    furosemide (LASIX) 20 MG tablet, Take 1 tablet (20 mg total) by mouth daily as needed for fluid or edema., Disp: 90 tablet, Rfl: 3   gabapentin (NEURONTIN) 300 MG capsule, Take 1 capsule (300 mg total)  by mouth 3 (three) times daily., Disp: 270 capsule, Rfl: 3   ipratropium-albuterol (DUONEB) 0.5-2.5 (3) MG/3ML SOLN, Take 3 mLs by nebulization every 6 (six) hours as needed., Disp: 360 mL, Rfl: 6   losartan-hydrochlorothiazide (HYZAAR) 100-12.5 MG tablet, Take 1 tablet by mouth daily., Disp: 90 tablet, Rfl: 3   Multiple Vitamins-Minerals (MULTIVITAMIN ADULT EXTRA C) CHEW, , Disp: , Rfl:    naproxen sodium (ALEVE) 220 MG tablet, Take 220 mg by mouth daily as needed (Arthritis)., Disp: , Rfl:    potassium chloride (KLOR-CON) 10 MEQ tablet, Take 1 tablet (10 mEq total) by mouth daily as needed (With Furosemide). Take with lasix, Disp: 90 tablet, Rfl: 3   rOPINIRole (REQUIP) 1 MG tablet, Take 1 tablet (1 mg total) by mouth 3 (three) times daily. (Patient taking differently: Take 1 mg by mouth at bedtime. Take 1 extra on days with worse symptoms), Disp: 90 tablet, Rfl: 3   Assessment     ICD-10-CM   1. PAD (peripheral artery disease) (HCC)  I73.9 EKG 12-Lead    2. Venous intermittent claudication  I87.8     3. Cellulitis of left lower extremity  L03.116 cephALEXin (KEFLEX) 500 MG capsule    4. Chronic diastolic CHF (congestive heart failure) (HCC)  I50.32     5. Primary hypertension  I10        Meds ordered this encounter  Medications   cephALEXin (KEFLEX) 500 MG capsule    Sig: Take 1 capsule (500 mg total) by mouth 4 (four) times daily.    Dispense:  40 capsule    Refill:  0   There are no discontinued medications.    Orders Placed This Encounter  Procedures   EKG 12-Lead    Recommendations:   Holly S Goffe  is a 77 y.o. with peripheral artery disease has known long left SFA CTO with failed attempt in September 2020 for revascularization, also has right calcific iliac artery stenosis which was felt to be high risk due to extension into right common femoral artery for dissection. She has right hip claudication and also left leg claudication that is chronic but has remained  stable.  Past medical history  significant for tobacco use disorder, which she quit on 10/21/2019, but started smoking about 2 weeks ago 10/29/21, hypertension, COPD,Stage III chronic kidney disease, history of breast cancer, bilateral venous insufficiency and has had ablation and persistent superficial varicose veins with complications and recurrent varicose vein bleeding and h/o  UGI bleed on 09/18/2021 needing blood transfusion, EGD revealing nonbleeding gastric ulcer, acute gastritis and medium-sized hiatal hernia.  She now presents here for follow-up of PAD, hypertension and chronic diastolic heart failure.  1. PAD (peripheral artery disease) (HCC) Patient has been stable from PAD standpoint, today her main complaint is worsening leg edema and discomfort in her leg which I suspect is due to venous intermittent claudication.  Do not suspect heart failure. - EKG 12-Lead  2. Venous intermittent claudication Her leg discomfort is related to edema and venous intermittent claudication.  She has been referred for vascular evaluation, now she lives in [place] and hence is here for her to go to [place].  3. Cellulitis of left lower extremity She is now developing cellulitis of her left lower extremity related to venous insufficiency and edema, she has early signs on the right as well but not as prominent.  Will go ahead and start her on cephalexin 500 mg 4 times a day, advised her to contact us if her symptoms are not getting better.  - cephALEXin (KEFLEX) 500 MG capsule; Take 1 capsule (500 mg total) by mouth 4 (four) times daily.  Dispense: 40 capsule; Refill: 0  4. Chronic diastolic CHF (congestive heart failure) (HCC) Although patient has diagnosis of chronic diastolic heart failure, no JVD, lungs are relatively clear and she has not had any worsening dyspnea.  Edema is confined to the lower extremity only.  Hence overall stable from cardiac standpoint.  5. Primary hypertension Blood pressure is  also well-controlled.  Initial blood pressure was high, recheck 130/50 mmHg.  I will see her back in a year unless she establishes with cardiology closer to home.  Other orders - cyanocobalamin (VITAMIN B12) 1000 MCG tablet; Take 1,000 mcg by mouth daily.     Yates Decamp, MD, [facility] 01/17/2023, 6:06 PM Office: [phone] Fax: [phone] Pager: [phone]

## 2023-01-29 ENCOUNTER — Encounter: Payer: Self-pay | Admitting: Hematology

## 2023-02-12 ENCOUNTER — Telehealth: Payer: Self-pay | Admitting: Cardiology

## 2023-02-12 DIAGNOSIS — F411 Generalized anxiety disorder: Secondary | ICD-10-CM

## 2023-02-12 NOTE — Telephone Encounter (Signed)
Pt c/o medication issue:  1. Name of Medication:   ALPRAZolam (XANAX) 1 MG tablet    2. How are you currently taking this medication (dosage and times per day)? Taking 3x a say or as needed  3. Are you having a reaction (difficulty breathing--STAT)? No   4. What is your medication issue? Patient is calling requesting Dr. Jacinto Halim fill Xanax prescription until she is seen by a PCP to have them to take over the medication.   She states she is almost out of the medication.   Please advise.

## 2023-02-13 ENCOUNTER — Other Ambulatory Visit: Payer: Self-pay

## 2023-02-15 NOTE — Telephone Encounter (Signed)
Was this able to be called in?

## 2023-02-16 NOTE — Telephone Encounter (Signed)
I am having difficulty in calling it in as my [employer] is not working Can send 30 tab.

## 2023-02-19 NOTE — Telephone Encounter (Signed)
This was taken care of by her PCP for this month.

## 2023-02-26 ENCOUNTER — Ambulatory Visit (INDEPENDENT_AMBULATORY_CARE_PROVIDER_SITE_OTHER): Payer: 59 | Admitting: Nurse Practitioner

## 2023-02-26 ENCOUNTER — Encounter (INDEPENDENT_AMBULATORY_CARE_PROVIDER_SITE_OTHER): Payer: Self-pay | Admitting: Nurse Practitioner

## 2023-02-26 VITALS — BP 151/72 | HR 70 | Resp 19 | Ht <= 58 in | Wt 120.8 lb

## 2023-02-26 DIAGNOSIS — M7989 Other specified soft tissue disorders: Secondary | ICD-10-CM

## 2023-02-26 DIAGNOSIS — I739 Peripheral vascular disease, unspecified: Secondary | ICD-10-CM | POA: Diagnosis not present

## 2023-02-26 DIAGNOSIS — I10 Essential (primary) hypertension: Secondary | ICD-10-CM

## 2023-02-26 DIAGNOSIS — I5032 Chronic diastolic (congestive) heart failure: Secondary | ICD-10-CM | POA: Diagnosis not present

## 2023-02-26 MED ORDER — DOXYCYCLINE HYCLATE 100 MG PO CAPS: 100.0000 mg | ORAL_CAPSULE | Freq: Two times a day (BID) | ORAL | 0 refills | Status: DC

## 2023-02-26 NOTE — Progress Notes (Signed)
Subjective:    Patient ID: [name], female    DOB: [birth year], 77 y.o.   MRN: [id number] Chief Complaint  Patient presents with   Establish Care    The patient is a 77 year old female referred by Dr. Malvin Johns for lower extremity edema.  The patient notes that she has had swelling in her legs for years with the left being much worse.  Recently it seems to have worsened and her legs are tender and achy.  She has also developed 2 blisters on her ankle area of her right foot and these have been slow to heal over the last few weeks.  She does have a medical history of peripheral arterial disease.  In addition she notes that she has had her veins stripped in her left lower extremity years ago.  Currently she has no weeping.  She has worn medical grade compression stockings for years up until recently where her arthritis is made difficult for her to utilize them.    Review of Systems  Cardiovascular:  Positive for leg swelling.  Skin:  Positive for wound.  Neurological:  Positive for weakness.  All other systems reviewed and are negative.      Objective:   Physical Exam Vitals reviewed.  HENT:     Head: Normocephalic.  Cardiovascular:     Rate and Rhythm: Normal rate.     Pulses:          Dorsalis pedis pulses are 0 on the right side and 1+ on the left side.       Posterior tibial pulses are 0 on the right side and 0 on the left side.  Pulmonary:     Effort: Pulmonary effort is normal.  Musculoskeletal:     Right lower leg: 2+ Edema present.     Left lower leg: 3+ Edema present.  Skin:    General: Skin is warm and dry.  Neurological:     Mental Status: She is alert and oriented to person, place, and time.  Psychiatric:        Mood and Affect: Mood normal.        Behavior: Behavior normal.        Thought Content: Thought content normal.        Judgment: Judgment normal.     BP (!) 151/72 (BP Location: Left Arm)   Pulse 70   Resp 19   Ht 4\' 10"  (1.473 m)   Wt 120  lb 12.8 oz (54.8 kg)   BMI 25.25 kg/m   Past Medical History:  Diagnosis Date   Acute renal failure superimposed on stage 3 chronic kidney disease (HCC) 10/27/2014   Back pain, chronic    Breast cancer (HCC) 09/09/2010   Cancer of colon (HCC) 05/24/2011   Centrilobular emphysema (HCC) 07/15/2018   CKD (chronic kidney disease), stage III (HCC)    Claudication, intermittent (HCC) 07/15/2018   Depression    GIB (gastrointestinal bleeding)    H/O tobacco use, presenting hazards to health Quit Dec 2019 05/03/2011   50 years, up to 3PPD quit last year.    Hx of varicose veins of lower extremity 07/15/2018   Hyperlipidemia    Hypertension    SDH (subdural hematoma) (HCC)    SOB (shortness of breath) 07/15/2018    Social History   Socioeconomic History   Marital status: Divorced    Spouse name: Not on file   Number of children: 2   Years of education: Not on file  Highest education level: Not on file  Occupational History   Not on file  Tobacco Use   Smoking status: Former    Current packs/day: 0.00    Average packs/day: 0.3 packs/day for 30.0 years (7.5 ttl pk-yrs)    Types: Cigarettes    Start date: 10/30/1989    Quit date: 10/31/2019    Years since quitting: 3.3   Smokeless tobacco: Never   Tobacco comments:    had 6 cigarettes recently 03-10-20  Vaping Use   Vaping status: Never Used  Substance and Sexual Activity   Alcohol use: Yes    Comment: rarely    Drug use: No   Sexual activity: Not Currently  Other Topics Concern   Not on file  Social History Narrative   Lives alone in a one story home.  Has 2 children.     On disability for low back pain since the age of 75.     Education: high school.   Social Determinants of Health   Financial Resource Strain: Not on file  Food Insecurity: Not on file  Transportation Needs: Not on file  Physical Activity: Not on file  Stress: Not on file  Social Connections: Not on file  Intimate Partner Violence: Not on file     Past Surgical History:  Procedure Laterality Date   ABDOMINAL AORTOGRAM W/LOWER EXTREMITY Left 03/18/2019   Procedure: ABDOMINAL AORTOGRAM W/LOWER EXTREMITY;  Surgeon: Yates Decamp, MD;  Location: [facility] INVASIVE CV LAB;  Service: Cardiovascular;  Laterality: Left;   BACK SURGERY     BIOPSY  09/20/2021   Procedure: BIOPSY;  Surgeon: Charlott Rakes, MD;  Location: [facility] ENDOSCOPY;  Service: Gastroenterology;;   BREAST LUMPECTOMY Right 10/10/2010   COLON SURGERY  04/2011   COLONOSCOPY  05/03/2011   Procedure: COLONOSCOPY;  Surgeon: Petra Kuba, MD;  Location: [facility] ENDOSCOPY;  Service: Endoscopy;  Laterality: N/A;   ESOPHAGOGASTRODUODENOSCOPY (EGD) WITH PROPOFOL N/A 09/20/2021   Procedure: ESOPHAGOGASTRODUODENOSCOPY (EGD) WITH PROPOFOL;  Surgeon: Charlott Rakes, MD;  Location: [facility] ENDOSCOPY;  Service: Gastroenterology;  Laterality: N/A;   JOINT REPLACEMENT     R&L total shoulder replacements   LUMBAR DISC SURGERY     PARTIAL HYSTERECTOMY     PERIPHERAL VASCULAR INTERVENTION  03/18/2019   Procedure: PERIPHERAL VASCULAR INTERVENTION;  Surgeon: Yates Decamp, MD;  Location: [facility] INVASIVE CV LAB;  Service: Cardiovascular;;  attempted left SFA   SHOULDER SURGERY     TUMOR REMOVAL  Tumor removed R breast    Family History  Problem Relation Age of Onset   Diabetes Mother    Hypertension Mother    Cancer Mother        leukemia   Sudden death Mother    Heart attack Mother    Anesthesia problems Neg Hx    Hypotension Neg Hx    Malignant hyperthermia Neg Hx    Pseudochol deficiency Neg Hx    Breast cancer Neg Hx    Lung disease Neg Hx     No Known Allergies     Latest Ref Rng & Units 10/25/2021   12:15 PM 09/22/2021    5:29 AM 09/21/2021    6:21 PM  CBC  WBC 4.0 - 10.5 K/uL 3.7  3.0    Hemoglobin 12.0 - 15.0 g/dL 16.1  8.6  9.1   Hematocrit 36.0 - 46.0 % 35.6  27.0  28.6   Platelets 150 - 400 K/uL 184  121        CMP  Component Value Date/Time   NA 143 11/04/2021 1447   NA 140  01/25/2016 1310   K 3.8 11/04/2021 1447   K 3.8 01/25/2016 1310   CL 101 11/04/2021 1447   CL 101 04/01/2012 1232   CO2 26 11/04/2021 1447   CO2 23 01/25/2016 1310   GLUCOSE 107 (H) 11/04/2021 1447   GLUCOSE 106 (H) 10/25/2021 1215   GLUCOSE 153 (H) 01/25/2016 1310   GLUCOSE 87 04/01/2012 1232   BUN 27 11/04/2021 1447   BUN 44.8 (H) 01/25/2016 1310   CREATININE 1.37 (H) 11/04/2021 1447   CREATININE 1.6 (H) 01/25/2016 1310   CALCIUM 11.4 (H) 11/04/2021 1447   CALCIUM 9.8 01/25/2016 1310   PROT 6.6 10/25/2021 1215   PROT 6.1 06/12/2019 1503   PROT 6.2 (L) 01/25/2016 1310   ALBUMIN 3.8 10/25/2021 1215   ALBUMIN 4.3 06/12/2019 1503   ALBUMIN 3.3 (L) 01/25/2016 1310   AST 31 10/25/2021 1215   AST 23 01/25/2016 1310   ALT 19 10/25/2021 1215   ALT 15 01/25/2016 1310   ALKPHOS 79 10/25/2021 1215   ALKPHOS 79 01/25/2016 1310   BILITOT 0.7 10/25/2021 1215   BILITOT 0.2 06/12/2019 1503   BILITOT 0.49 01/25/2016 1310   EGFR 40 (L) 11/04/2021 1447   GFRNONAA 18 (L) 10/25/2021 1215     No results found.     Assessment & Plan:   1. Leg swelling The patient has noted swelling bilaterally but the left lower extremity is much worse.  The swelling has been ongoing for years and she has had a recent DVT studies which were negative.  I suspect that this is a combination of lymphedema in addition to other systemic causes such as CHF and chronic kidney disease.  She does have wounds that are present on the ankle area of her right lower extremity.  There is also discoloration concerning for cellulitis.  Today we will send in antibiotics in addition to placing the patient in Unna boots to try to help with wound healing and control of her swelling.  Depending on how she does we may also consider lymphedema pump.  At her follow-up we will also get bilateral venous reflux studies to determine if there is venous insufficiency  2. Primary hypertension Continue antihypertensive medications as already  ordered, these medications have been reviewed and there are no changes at this time.  3. PAD (peripheral artery disease) (HCC) Given the patient's slow healing wound and history of PAD, we will obtain ABIs to determine if there may be an arterial issue slowing her wound healing.  4. Chronic diastolic CHF (congestive heart failure) (HCC) This also likely contributes to the patient's edema   Current Outpatient Medications on File Prior to Visit  Medication Sig Dispense Refill   Acetaminophen (TYLENOL EXTRA STRENGTH PO) Take by mouth.     ALPRAZolam (XANAX) 1 MG tablet Take 1 tablet (1 mg total) by mouth 2 (two) times daily as needed. 30 tablet 0   amLODipine (NORVASC) 5 MG tablet Take 1 tablet (5 mg total) by mouth daily. 90 tablet 3   atorvastatin (LIPITOR) 10 MG tablet Take 1 tablet (10 mg total) by mouth daily. 90 tablet 3   buprenorphine (BUTRANS) 10 MCG/HR PTWK Place 1 patch onto the skin once a week.     cholecalciferol (VITAMIN D3) 10 MCG/ML LIQD oral liquid Take 400 Units by mouth daily.     cyanocobalamin (VITAMIN B12) 1000 MCG tablet Take 1,000 mcg by mouth daily.  furosemide (LASIX) 20 MG tablet Take 1 tablet (20 mg total) by mouth daily as needed for fluid or edema. (Patient taking differently: Take 20 mg by mouth daily.) 90 tablet 3   gabapentin (NEURONTIN) 300 MG capsule Take 1 capsule (300 mg total) by mouth 3 (three) times daily. 270 capsule 3   hydrochlorothiazide (HYDRODIURIL) 25 MG tablet Take 1 tablet by mouth daily.     ipratropium (ATROVENT) 0.02 % nebulizer solution Inhale into the lungs.     ipratropium-albuterol (DUONEB) 0.5-2.5 (3) MG/3ML SOLN Take 3 mLs by nebulization every 6 (six) hours as needed. 360 mL 6   Multiple Vitamins-Minerals (MULTIVITAMIN ADULT EXTRA C) CHEW      naproxen sodium (ALEVE) 220 MG tablet Take 220 mg by mouth daily as needed (Arthritis).     potassium chloride (KLOR-CON) 10 MEQ tablet Take 1 tablet (10 mEq total) by mouth daily as needed  (With Furosemide). Take with lasix 90 tablet 3   rOPINIRole (REQUIP) 1 MG tablet Take 1 tablet (1 mg total) by mouth 3 (three) times daily. (Patient taking differently: Take 1 mg by mouth at bedtime. Take 1 extra on days with worse symptoms) 90 tablet 3   No current facility-administered medications on file prior to visit.    There are no Patient Instructions on file for this visit. No follow-ups on file.   Georgiana Spinner, NP

## 2023-02-28 ENCOUNTER — Encounter (INDEPENDENT_AMBULATORY_CARE_PROVIDER_SITE_OTHER): Payer: Self-pay | Admitting: Nurse Practitioner

## 2023-02-28 ENCOUNTER — Ambulatory Visit (INDEPENDENT_AMBULATORY_CARE_PROVIDER_SITE_OTHER): Payer: 59 | Admitting: Nurse Practitioner

## 2023-02-28 VITALS — BP 148/68 | HR 74 | Resp 18 | Ht <= 58 in | Wt 121.0 lb

## 2023-02-28 DIAGNOSIS — M7989 Other specified soft tissue disorders: Secondary | ICD-10-CM

## 2023-02-28 NOTE — Progress Notes (Signed)
History of Present Illness  There is no documented history at this time  Assessments & Plan   There are no diagnoses linked to this encounter.    Additional instructions  Subjective:  Patient presents with venous ulcer of the Right lower extremity.    Procedure:  3 layer unna wrap was placed Right lower extremity.   Plan:   Follow up in one week. Changed the boot on the right foot it was tight and causing pain.

## 2023-03-05 ENCOUNTER — Encounter (INDEPENDENT_AMBULATORY_CARE_PROVIDER_SITE_OTHER): Payer: Self-pay | Admitting: Vascular Surgery

## 2023-03-05 ENCOUNTER — Ambulatory Visit (INDEPENDENT_AMBULATORY_CARE_PROVIDER_SITE_OTHER): Payer: 59 | Admitting: Vascular Surgery

## 2023-03-05 ENCOUNTER — Telehealth (INDEPENDENT_AMBULATORY_CARE_PROVIDER_SITE_OTHER): Payer: Self-pay

## 2023-03-05 VITALS — BP 148/76 | HR 67 | Resp 18 | Ht <= 58 in | Wt 121.0 lb

## 2023-03-05 DIAGNOSIS — I872 Venous insufficiency (chronic) (peripheral): Secondary | ICD-10-CM | POA: Diagnosis not present

## 2023-03-05 NOTE — Progress Notes (Signed)
History of Present Illness  There is no documented history at this time  Assessments & Plan   There are no diagnoses linked to this encounter.    Additional instructions  Subjective:  Patient presents with venous ulcer of the Bilateral lower extremity.    Procedure:  3 layer unna wrap was placed Bilateral lower extremity.   Plan:   Follow up in one week.  Patient legs are tender to the touch, xeroform applied to lower right leg wound,   Patient stated that the Abx she is taking now is making her sick even when taking after a meal. She is currently take Doxycycline 100mg 

## 2023-03-05 NOTE — Telephone Encounter (Addendum)
Patient was placed on Doxycycline 100 mg 2 times a day. She stated that she has nausea and vomiting when taking the meds every after a regular meal. She would like to know should she take the last 5 pills or do she need something else.   Please advise

## 2023-03-06 NOTE — Telephone Encounter (Signed)
Patient took a total of 15 pills out of 20 equaling 7.5 days. I informed her, she was okay to stop the medication.

## 2023-03-06 NOTE — Telephone Encounter (Signed)
If she can take a total of 7 days she can stop after that

## 2023-03-12 ENCOUNTER — Ambulatory Visit (INDEPENDENT_AMBULATORY_CARE_PROVIDER_SITE_OTHER): Payer: 59 | Admitting: Nurse Practitioner

## 2023-03-12 ENCOUNTER — Encounter (INDEPENDENT_AMBULATORY_CARE_PROVIDER_SITE_OTHER): Payer: Self-pay

## 2023-03-12 VITALS — BP 142/56 | HR 69 | Resp 15

## 2023-03-12 DIAGNOSIS — I872 Venous insufficiency (chronic) (peripheral): Secondary | ICD-10-CM

## 2023-03-12 NOTE — Progress Notes (Signed)
History of Present Illness  There is no documented history at this time  Assessments & Plan   There are no diagnoses linked to this encounter.    Additional instructions  Subjective:  Patient presents with venous ulcer of the Bilateral lower extremity.    Procedure:  3 layer unna wrap was placed Bilateral lower extremity.   Plan:   Follow up in one week.  

## 2023-03-18 ENCOUNTER — Encounter (INDEPENDENT_AMBULATORY_CARE_PROVIDER_SITE_OTHER): Payer: Self-pay | Admitting: Nurse Practitioner

## 2023-03-19 ENCOUNTER — Encounter (INDEPENDENT_AMBULATORY_CARE_PROVIDER_SITE_OTHER): Payer: Self-pay

## 2023-03-19 ENCOUNTER — Ambulatory Visit (INDEPENDENT_AMBULATORY_CARE_PROVIDER_SITE_OTHER): Payer: 59 | Admitting: Nurse Practitioner

## 2023-03-19 VITALS — BP 160/79 | HR 78 | Resp 16

## 2023-03-19 DIAGNOSIS — M7989 Other specified soft tissue disorders: Secondary | ICD-10-CM | POA: Diagnosis not present

## 2023-03-19 NOTE — Progress Notes (Signed)
History of Present Illness  There is no documented history at this time  Assessments & Plan   There are no diagnoses linked to this encounter.    Additional instructions  Subjective:  Patient presents with venous ulcer of the Right lower extremity.    Procedure:  3 layer unna wrap was placed Right lower extremity.   Plan:   Follow up in one week.   

## 2023-03-23 ENCOUNTER — Encounter: Payer: Self-pay | Admitting: Hematology

## 2023-03-23 ENCOUNTER — Other Ambulatory Visit (INDEPENDENT_AMBULATORY_CARE_PROVIDER_SITE_OTHER): Payer: Self-pay | Admitting: Nurse Practitioner

## 2023-03-23 DIAGNOSIS — I739 Peripheral vascular disease, unspecified: Secondary | ICD-10-CM

## 2023-03-23 DIAGNOSIS — M7989 Other specified soft tissue disorders: Secondary | ICD-10-CM

## 2023-03-27 ENCOUNTER — Telehealth: Payer: Self-pay | Admitting: Cardiology

## 2023-03-27 DIAGNOSIS — G2581 Restless legs syndrome: Secondary | ICD-10-CM

## 2023-03-27 MED ORDER — ROPINIROLE HCL 1 MG PO TABS: 1.0000 mg | ORAL_TABLET | Freq: Three times a day (TID) | ORAL | 2 refills | Status: DC

## 2023-03-27 NOTE — Telephone Encounter (Signed)
Called the patient back.  Dr. Jacinto Halim has prescribed the Requip 1 mg tablet most recently.  The patient is going to be out of medication and wanted to report that there are days she is taking 4 or 5 of these in order to get relief from symptoms.  She has some imaging studies coming up for [facility].  She will review symptoms and medication with them.  Pt aware that Dr. Jacinto Halim is on vacation this week and next week and since I am unable to review her symptoms w him, I will send the refill in and asked her to follow up with her PCP and the vascular doctors regarding her symptoms.  Pt voices understanding and agreement with this recommendation.

## 2023-03-27 NOTE — Telephone Encounter (Signed)
Pt c/o medication issue:  1. Name of Medication:   rOPINIRole (REQUIP) 1 MG tablet    2. How are you currently taking this medication (dosage and times per day)? As written   3. Are you having a reaction (difficulty breathing--STAT)? No   4. What is your medication issue? Pt called in stating she uses this medication for restless leg syndrome and it has been bothering her more than usual. She asked if the dosage can be increased or if th amount of times she takes it a day can be changed. Please advise. She is in need of refill of this med.

## 2023-03-28 ENCOUNTER — Encounter (INDEPENDENT_AMBULATORY_CARE_PROVIDER_SITE_OTHER): Payer: 59

## 2023-03-28 ENCOUNTER — Ambulatory Visit (INDEPENDENT_AMBULATORY_CARE_PROVIDER_SITE_OTHER): Payer: 59

## 2023-03-28 ENCOUNTER — Encounter (INDEPENDENT_AMBULATORY_CARE_PROVIDER_SITE_OTHER): Payer: Self-pay

## 2023-03-28 DIAGNOSIS — I739 Peripheral vascular disease, unspecified: Secondary | ICD-10-CM

## 2023-03-28 DIAGNOSIS — M7989 Other specified soft tissue disorders: Secondary | ICD-10-CM

## 2023-03-29 LAB — VAS US ABI WITH/WO TBI
Left ABI: 0.65
Right ABI: 0.82

## 2023-04-18 ENCOUNTER — Other Ambulatory Visit: Payer: Self-pay

## 2023-04-18 ENCOUNTER — Ambulatory Visit (INDEPENDENT_AMBULATORY_CARE_PROVIDER_SITE_OTHER): Payer: 59 | Admitting: Nurse Practitioner

## 2023-04-18 DIAGNOSIS — I739 Peripheral vascular disease, unspecified: Secondary | ICD-10-CM

## 2023-04-18 DIAGNOSIS — E7800 Pure hypercholesterolemia, unspecified: Secondary | ICD-10-CM

## 2023-04-18 MED ORDER — ATORVASTATIN CALCIUM 10 MG PO TABS: 10.0000 mg | ORAL_TABLET | Freq: Every day | ORAL | 2 refills | Status: DC

## 2023-05-01 ENCOUNTER — Emergency Department: Payer: 59

## 2023-05-01 ENCOUNTER — Other Ambulatory Visit: Payer: Self-pay

## 2023-05-01 ENCOUNTER — Encounter: Payer: Self-pay | Admitting: Hematology

## 2023-05-01 ENCOUNTER — Inpatient Hospital Stay
Admission: EM | Admit: 2023-05-01 | Discharge: 2023-05-15 | DRG: 291 | Disposition: A | Discharge status: Skilled Nursing Facility | Payer: 59 | Source: Skilled Nursing Facility | Attending: Internal Medicine | Admitting: Internal Medicine

## 2023-05-01 DIAGNOSIS — Z515 Encounter for palliative care: Secondary | ICD-10-CM

## 2023-05-01 DIAGNOSIS — Z90711 Acquired absence of uterus with remaining cervical stump: Secondary | ICD-10-CM

## 2023-05-01 DIAGNOSIS — F32A Depression, unspecified: Secondary | ICD-10-CM | POA: Diagnosis present

## 2023-05-01 DIAGNOSIS — I130 Hypertensive heart and chronic kidney disease with heart failure and stage 1 through stage 4 chronic kidney disease, or unspecified chronic kidney disease: Secondary | ICD-10-CM | POA: Diagnosis not present

## 2023-05-01 DIAGNOSIS — J441 Chronic obstructive pulmonary disease with (acute) exacerbation: Secondary | ICD-10-CM | POA: Diagnosis not present

## 2023-05-01 DIAGNOSIS — E876 Hypokalemia: Secondary | ICD-10-CM | POA: Diagnosis present

## 2023-05-01 DIAGNOSIS — Z87891 Personal history of nicotine dependence: Secondary | ICD-10-CM

## 2023-05-01 DIAGNOSIS — G2581 Restless legs syndrome: Secondary | ICD-10-CM | POA: Diagnosis present

## 2023-05-01 DIAGNOSIS — I5033 Acute on chronic diastolic (congestive) heart failure: Secondary | ICD-10-CM

## 2023-05-01 DIAGNOSIS — Z79899 Other long term (current) drug therapy: Secondary | ICD-10-CM

## 2023-05-01 DIAGNOSIS — Z8711 Personal history of peptic ulcer disease: Secondary | ICD-10-CM

## 2023-05-01 DIAGNOSIS — Z751 Person awaiting admission to adequate facility elsewhere: Secondary | ICD-10-CM

## 2023-05-01 DIAGNOSIS — I10 Essential (primary) hypertension: Secondary | ICD-10-CM | POA: Diagnosis present

## 2023-05-01 DIAGNOSIS — E785 Hyperlipidemia, unspecified: Secondary | ICD-10-CM | POA: Diagnosis present

## 2023-05-01 DIAGNOSIS — Z9981 Dependence on supplemental oxygen: Secondary | ICD-10-CM

## 2023-05-01 DIAGNOSIS — Z96612 Presence of left artificial shoulder joint: Secondary | ICD-10-CM | POA: Diagnosis present

## 2023-05-01 DIAGNOSIS — J449 Chronic obstructive pulmonary disease, unspecified: Secondary | ICD-10-CM | POA: Diagnosis present

## 2023-05-01 DIAGNOSIS — L8990 Pressure ulcer of unspecified site, unspecified stage: Secondary | ICD-10-CM | POA: Insufficient documentation

## 2023-05-01 DIAGNOSIS — I739 Peripheral vascular disease, unspecified: Secondary | ICD-10-CM | POA: Diagnosis present

## 2023-05-01 DIAGNOSIS — Z96611 Presence of right artificial shoulder joint: Secondary | ICD-10-CM | POA: Diagnosis present

## 2023-05-01 DIAGNOSIS — D649 Anemia, unspecified: Secondary | ICD-10-CM | POA: Insufficient documentation

## 2023-05-01 DIAGNOSIS — N183 Chronic kidney disease, stage 3 unspecified: Secondary | ICD-10-CM | POA: Diagnosis present

## 2023-05-01 DIAGNOSIS — N179 Acute kidney failure, unspecified: Secondary | ICD-10-CM | POA: Diagnosis present

## 2023-05-01 DIAGNOSIS — J432 Centrilobular emphysema: Secondary | ICD-10-CM | POA: Diagnosis present

## 2023-05-01 DIAGNOSIS — G4700 Insomnia, unspecified: Secondary | ICD-10-CM | POA: Diagnosis present

## 2023-05-01 DIAGNOSIS — T502X5A Adverse effect of carbonic-anhydrase inhibitors, benzothiadiazides and other diuretics, initial encounter: Secondary | ICD-10-CM | POA: Diagnosis present

## 2023-05-01 DIAGNOSIS — G8929 Other chronic pain: Secondary | ICD-10-CM | POA: Diagnosis present

## 2023-05-01 DIAGNOSIS — Z853 Personal history of malignant neoplasm of breast: Secondary | ICD-10-CM

## 2023-05-01 DIAGNOSIS — L89151 Pressure ulcer of sacral region, stage 1: Secondary | ICD-10-CM | POA: Diagnosis present

## 2023-05-01 DIAGNOSIS — D509 Iron deficiency anemia, unspecified: Secondary | ICD-10-CM | POA: Diagnosis present

## 2023-05-01 DIAGNOSIS — F419 Anxiety disorder, unspecified: Secondary | ICD-10-CM | POA: Diagnosis present

## 2023-05-01 DIAGNOSIS — Z8249 Family history of ischemic heart disease and other diseases of the circulatory system: Secondary | ICD-10-CM

## 2023-05-01 DIAGNOSIS — R6881 Early satiety: Secondary | ICD-10-CM | POA: Diagnosis not present

## 2023-05-01 DIAGNOSIS — J9621 Acute and chronic respiratory failure with hypoxia: Secondary | ICD-10-CM

## 2023-05-01 DIAGNOSIS — Z9889 Other specified postprocedural states: Secondary | ICD-10-CM

## 2023-05-01 DIAGNOSIS — Z85038 Personal history of other malignant neoplasm of large intestine: Secondary | ICD-10-CM

## 2023-05-01 LAB — URINALYSIS, ROUTINE W REFLEX MICROSCOPIC
Bacteria, UA: NONE SEEN
Bilirubin Urine: NEGATIVE
Glucose, UA: NEGATIVE mg/dL
Hgb urine dipstick: NEGATIVE
Ketones, ur: NEGATIVE mg/dL
Leukocytes,Ua: NEGATIVE
Nitrite: NEGATIVE
Protein, ur: 30 mg/dL — AB
RBC / HPF: 0 RBC/hpf (ref 0–5)
Specific Gravity, Urine: 1.006 (ref 1.005–1.030)
pH: 9 — ABNORMAL HIGH (ref 5.0–8.0)

## 2023-05-01 LAB — CBC
HCT: 30 % — ABNORMAL LOW (ref 36.0–46.0)
Hemoglobin: 9.4 g/dL — ABNORMAL LOW (ref 12.0–15.0)
MCH: 27.1 pg (ref 26.0–34.0)
MCHC: 31.3 g/dL (ref 30.0–36.0)
MCV: 86.5 fL (ref 80.0–100.0)
Platelets: 379 10*3/uL (ref 150–400)
RBC: 3.47 MIL/uL — ABNORMAL LOW (ref 3.87–5.11)
RDW: 15.9 % — ABNORMAL HIGH (ref 11.5–15.5)
WBC: 5.4 10*3/uL (ref 4.0–10.5)
nRBC: 0 % (ref 0.0–0.2)

## 2023-05-01 LAB — BASIC METABOLIC PANEL
Anion gap: 12 (ref 5–15)
BUN: 14 mg/dL (ref 8–23)
CO2: 28 mmol/L (ref 22–32)
Calcium: 10.2 mg/dL (ref 8.9–10.3)
Chloride: 100 mmol/L (ref 98–111)
Creatinine, Ser: 0.94 mg/dL (ref 0.44–1.00)
GFR, Estimated: >60 mL/min (ref >60)
Glucose, Bld: 115 mg/dL — ABNORMAL HIGH (ref 70–99)
Potassium: 3.6 mmol/L (ref 3.5–5.1)
Sodium: 140 mmol/L (ref 135–145)

## 2023-05-01 LAB — HEPATIC FUNCTION PANEL
ALT: 18 U/L (ref 0–44)
AST: 25 U/L (ref 15–41)
Albumin: 3.3 g/dL — ABNORMAL LOW (ref 3.5–5.0)
Alkaline Phosphatase: 86 U/L (ref 38–126)
Bilirubin, Direct: 0.1 mg/dL (ref 0.0–0.2)
Indirect Bilirubin: 0.6 mg/dL (ref 0.3–0.9)
Total Bilirubin: 0.7 mg/dL (ref <1.2)
Total Protein: 6.6 g/dL (ref 6.5–8.1)

## 2023-05-01 LAB — TROPONIN I (HIGH SENSITIVITY): Troponin I (High Sensitivity): 24 ng/L — ABNORMAL HIGH (ref <18)

## 2023-05-01 MED ORDER — IOHEXOL 350 MG/ML SOLN
75.0000 mL | Freq: Once | INTRAVENOUS | Status: AC | PRN
  Administered 2023-05-02: 75 mL via INTRAVENOUS

## 2023-05-01 MED ORDER — IPRATROPIUM-ALBUTEROL 0.5-2.5 (3) MG/3ML IN SOLN
9.0000 mL | Freq: Once | RESPIRATORY_TRACT | Status: AC
  Administered 2023-05-01: 9 mL via RESPIRATORY_TRACT
  Filled 2023-05-01: qty 9

## 2023-05-01 NOTE — ED Provider Notes (Incomplete)
[facility] Provider Note    Event Date/Time   First MD Initiated Contact with Patient 05/01/23 2310     (approximate)   History   Shortness of Breath   HPI  [name] is a 77 y.o. female past medical history significant for peripheral arterial disease, COPD on chronic 3.5 L home oxygen, hypertension, HFpEF, CKD, history of breast cancer in remission, recent upper GI bleed and gastric ulcer perforation requiring emergent surgery at [facility], who presents to the emergency department shortness of breath.  Patient was discharged from [facility] today following multiple week hospitalization for abdominal perforation.  Patient states that she went to a "hell hole" whenever she left the hospital and states that when she got to the facility no one had any of her home medications are new with the redoing.  States that no one was prepared for her to be there.  After being there for an hour or 2 had progressively worsening shortness of breath.  States that she feels like she cannot catch her breath.  Denies any significant chest pain.  Denies nausea vomiting or diaphoresis.  No significant cough.  Denies any history of DVT or PE.  Not on anticoagulation.  States that she normally wears around 3.5 L of home oxygen.  Denies any history of heart failure.  EMS given 125 mg of IV Solu-Medrol and 2 DuoNeb treatments prior to arrival with some relief.     Physical Exam   Triage Vital Signs: ED Triage Vitals  Encounter Vitals Group     BP 05/01/23 2242 (!) 165/86     Systolic BP Percentile --      Diastolic BP Percentile --      Pulse Rate 05/01/23 2242 (!) 110     Resp 05/01/23 2242 (!) 28     Temp 05/01/23 2242 98.8 F (37.1 C)     Temp Source 05/01/23 2242 Oral     SpO2 05/01/23 2242 95 %     Weight 05/01/23 2230 113 lb (51.3 kg)     Height 05/01/23 2230 4\' 10"  (1.473 m)     Head Circumference --      Peak Flow --      Pain Score 05/01/23 2230 10     Pain Loc  --      Pain Education --      Exclude from Growth Chart --     Most recent vital signs: Vitals:   05/01/23 2242  BP: (!) 165/86  Pulse: (!) 110  Resp: (!) 28  Temp: 98.8 F (37.1 C)  SpO2: 95%    Physical Exam Constitutional:      Appearance: She is well-developed.  HENT:     Head: Atraumatic.  Eyes:     Conjunctiva/sclera: Conjunctivae normal.  Cardiovascular:     Rate and Rhythm: Regular rhythm.  Pulmonary:     Effort: Respiratory distress present.     Breath sounds: Decreased breath sounds present.     Comments: Speaking in 2 word sentences Abdominal:     General: There is no distension.     Comments: Abdominal scar -clean dry and intact, mild surrounding erythema and warmth.  No induration or crepitance.  No significant tenderness to palpation.  Mild diffuse abdominal tenderness to palpation.  No rebound or guarding.  Musculoskeletal:        General: Normal range of motion.     Cervical back: Normal range of motion.     Right lower  leg: No edema.     Left lower leg: No edema.  Skin:    General: Skin is warm.  Neurological:     Mental Status: She is alert. Mental status is at baseline.     IMPRESSION / MDM / ASSESSMENT AND PLAN / ED COURSE  I reviewed the triage vital signs and the nursing notes.  Chart review patient was discharged from the hospital earlier today at [facility] where she had surgery for perforated gastric ulcer with a mesh.  Patient had a CTA on 11/17 at that time to workup shortness of breath.  No signs of pulmonary embolism but did show significant COPD.  It appears she had multiple rounds of respiratory distress requiring 4 L of oxygen while in the hospital.  Differential diagnosis including COPD exacerbation, pneumonia, aspiration, pulmonary embolism, ACS, anemia  EKG  I, Corena Herter, the attending physician, personally viewed and interpreted this ECG.  Sinus tachycardia with a heart rate of 106.   RADIOLOGY I independently  reviewed imaging, my interpretation of imaging: ***  LABS (all labs ordered are listed, but only abnormal results are displayed) Labs interpreted as -    Labs Reviewed  CBC - Abnormal; Notable for the following components:      Result Value   RBC 3.47 (*)    Hemoglobin 9.4 (*)    HCT 30.0 (*)    RDW 15.9 (*)    All other components within normal limits  BASIC METABOLIC PANEL  URINALYSIS, ROUTINE W REFLEX MICROSCOPIC  TROPONIN I (HIGH SENSITIVITY)     MDM       PROCEDURES:  Critical Care performed: No  Procedures  Patient's presentation is most consistent with {EM COPA:27473}   MEDICATIONS ORDERED IN ED: Medications - No data to display  FINAL CLINICAL IMPRESSION(S) / ED DIAGNOSES   Final diagnoses:  None     Rx / DC Orders   ED Discharge Orders     None        Note:  This document was prepared using Dragon voice recognition software and may include unintentional dictation errors.

## 2023-05-01 NOTE — ED Triage Notes (Addendum)
Pt brought in via ems from [facility] with diff breathing.  Pt was discharged from [facility] today and has been at [facility] for 4 hours.  Pt reports sob.  Pt is on 4 liters [place].   Ems gave 125 mg solumedrol and 2 breathing treatments with some relief. Fsbs per ems 143. Hx copd.  Pt had ulcer surgery on 11/17 at [facility].  Pt also reports restless leg pain.  Pt alert  speech clear

## 2023-05-01 NOTE — ED Notes (Signed)
Call Light answered, Pt states "You're not my nurse that was here a while ago that's why I pressed my button". This tech positioned call light in reach. RN notified.

## 2023-05-01 NOTE — ED Provider Notes (Incomplete)
[facility] Provider Note    Event Date/Time   First MD Initiated Contact with Patient 05/01/23 2310     (approximate)   History   Shortness of Breath   HPI  [name] is a 77 y.o. female past medical history significant for peripheral arterial disease, COPD on chronic 3.5 L home oxygen, hypertension, HFpEF, CKD, history of breast cancer in remission, recent upper GI bleed and gastric ulcer perforation requiring emergent surgery at [facility], who presents to the emergency department shortness of breath.  Patient was discharged from [facility] today following multiple week hospitalization for abdominal perforation.  Patient states that she went to a "hell hole" whenever she left the hospital and states that when she got to the facility no one had any of her home medications are new with the redoing.  States that no one was prepared for her to be there.  After being there for an hour or 2 had progressively worsening shortness of breath.  States that she feels like she cannot catch her breath.  Denies any significant chest pain.  Denies nausea vomiting or diaphoresis.  No significant cough.  Denies any history of DVT or PE.  Not on anticoagulation.  States that she normally wears around 3.5 L of home oxygen.  Denies any history of heart failure.  EMS given 125 mg of IV Solu-Medrol and 2 DuoNeb treatments prior to arrival with some relief.     Physical Exam   Triage Vital Signs: ED Triage Vitals  Encounter Vitals Group     BP 05/01/23 2242 (!) 165/86     Systolic BP Percentile --      Diastolic BP Percentile --      Pulse Rate 05/01/23 2242 (!) 110     Resp 05/01/23 2242 (!) 28     Temp 05/01/23 2242 98.8 F (37.1 C)     Temp Source 05/01/23 2242 Oral     SpO2 05/01/23 2242 95 %     Weight 05/01/23 2230 113 lb (51.3 kg)     Height 05/01/23 2230 4\' 10"  (1.473 m)     Head Circumference --      Peak Flow --      Pain Score 05/01/23 2230 10     Pain Loc  --      Pain Education --      Exclude from Growth Chart --     Most recent vital signs: Vitals:   05/01/23 2334 05/02/23 0000  BP:  (!) 183/88  Pulse:  (!) 110  Resp:    Temp:    SpO2: 95%     Physical Exam Constitutional:      Appearance: She is well-developed.  HENT:     Head: Atraumatic.  Eyes:     Conjunctiva/sclera: Conjunctivae normal.  Cardiovascular:     Rate and Rhythm: Regular rhythm.  Pulmonary:     Effort: Respiratory distress present.     Breath sounds: Decreased breath sounds present.     Comments: Speaking in 2 word sentences Abdominal:     General: There is no distension.     Comments: Abdominal scar -clean dry and intact, mild surrounding erythema and warmth.  No induration or crepitance.  No significant tenderness to palpation.  Mild diffuse abdominal tenderness to palpation.  No rebound or guarding.  Musculoskeletal:        General: Normal range of motion.     Cervical back: Normal range of motion.  Right lower leg: No edema.     Left lower leg: No edema.  Skin:    General: Skin is warm.  Neurological:     Mental Status: She is alert. Mental status is at baseline.     IMPRESSION / MDM / ASSESSMENT AND PLAN / ED COURSE  I reviewed the triage vital signs and the nursing notes.  Chart review patient was discharged from the hospital earlier today at [facility] where she had surgery for perforated gastric ulcer with a mesh.  Patient had a CTA on 11/17 at that time to workup shortness of breath.  No signs of pulmonary embolism but did show significant COPD.  It appears she had multiple rounds of respiratory distress requiring 4 L of oxygen while in the hospital.  Differential diagnosis including COPD exacerbation, pneumonia, aspiration, pulmonary embolism, ACS, anemia  EKG  I, Corena Herter, the attending physician, personally viewed and interpreted this ECG.  Sinus tachycardia with a heart rate of 106.  PACs present.  Nonspecific ST changes.   No ST elevation or depression.   RADIOLOGY I independently reviewed imaging, my interpretation of imaging: Chest x-ray with no focal findings consistent with pneumonia  CTA with no obvious central pulmonary embolism.  Large pleural effusion to the right side.  Read as no signs of pulmonary embolism with bilateral pleural effusion  LABS (all labs ordered are listed, but only abnormal results are displayed) Labs interpreted as -    Labs Reviewed  BASIC METABOLIC PANEL - Abnormal; Notable for the following components:      Result Value   Glucose, Bld 115 (*)    All other components within normal limits  CBC - Abnormal; Notable for the following components:   RBC 3.47 (*)    Hemoglobin 9.4 (*)    HCT 30.0 (*)    RDW 15.9 (*)    All other components within normal limits  URINALYSIS, ROUTINE W REFLEX MICROSCOPIC - Abnormal; Notable for the following components:   Color, Urine COLORLESS (*)    APPearance CLEAR (*)    pH 9.0 (*)    Protein, ur 30 (*)    All other components within normal limits  BRAIN NATRIURETIC PEPTIDE - Abnormal; Notable for the following components:   B Natriuretic Peptide 340.1 (*)    All other components within normal limits  HEPATIC FUNCTION PANEL - Abnormal; Notable for the following components:   Albumin 3.3 (*)    All other components within normal limits  TROPONIN I (HIGH SENSITIVITY) - Abnormal; Notable for the following components:   Troponin I (High Sensitivity) 24 (*)    All other components within normal limits  TROPONIN I (HIGH SENSITIVITY)     MDM  On reevaluation patient continues to be hypoxic with increased work of breathing.  Concern for acute on chronic heart failure exacerbation, chart review has a history of diastolic heart failure.  Given IV Lasix in the emergency department.  Consulted hospitalist for admission.     PROCEDURES:  Critical Care performed: No  Procedures  Patient's presentation is most consistent with acute  presentation with potential threat to life or bodily function.   MEDICATIONS ORDERED IN ED: Medications  ipratropium-albuterol (DUONEB) 0.5-2.5 (3) MG/3ML nebulizer solution 9 mL (9 mLs Nebulization Given 05/01/23 2331)  iohexol (OMNIPAQUE) 350 MG/ML injection 75 mL (75 mLs Intravenous Contrast Given 05/02/23 0010)  morphine (PF) 4 MG/ML injection 6 mg (6 mg Intravenous Given 05/02/23 0005)  furosemide (LASIX) injection 40 mg (40 mg  Intravenous Given 05/02/23 0103)  rOPINIRole (REQUIP) tablet 1 mg (1 mg Oral Given 05/02/23 0125)  gabapentin (NEURONTIN) capsule 800 mg (600 mg Oral Given 05/02/23 0125)    FINAL CLINICAL IMPRESSION(S) / ED DIAGNOSES   Final diagnoses:  COPD exacerbation (HCC)  Acute on chronic hypoxic respiratory failure (HCC)  Acute on chronic diastolic congestive heart failure (HCC)     Rx / DC Orders   ED Discharge Orders     None        Note:  This document was prepared using Dragon voice recognition software and may include unintentional dictation errors.   Corena Herter, MD 05/02/23 9147    Corena Herter, MD 05/02/23 8295

## 2023-05-02 ENCOUNTER — Inpatient Hospital Stay
Admit: 2023-05-02 | Discharge: 2023-05-02 | Disposition: A | Discharge status: Home / Self Care | Payer: 59 | Attending: Internal Medicine | Admitting: Internal Medicine

## 2023-05-02 ENCOUNTER — Emergency Department: Payer: 59

## 2023-05-02 DIAGNOSIS — I739 Peripheral vascular disease, unspecified: Secondary | ICD-10-CM | POA: Diagnosis present

## 2023-05-02 DIAGNOSIS — D509 Iron deficiency anemia, unspecified: Secondary | ICD-10-CM | POA: Diagnosis present

## 2023-05-02 DIAGNOSIS — F32A Depression, unspecified: Secondary | ICD-10-CM | POA: Diagnosis present

## 2023-05-02 DIAGNOSIS — N179 Acute kidney failure, unspecified: Secondary | ICD-10-CM | POA: Diagnosis present

## 2023-05-02 DIAGNOSIS — R001 Bradycardia, unspecified: Secondary | ICD-10-CM | POA: Diagnosis not present

## 2023-05-02 DIAGNOSIS — L89151 Pressure ulcer of sacral region, stage 1: Secondary | ICD-10-CM | POA: Diagnosis present

## 2023-05-02 DIAGNOSIS — Z515 Encounter for palliative care: Secondary | ICD-10-CM | POA: Diagnosis not present

## 2023-05-02 DIAGNOSIS — J449 Chronic obstructive pulmonary disease, unspecified: Secondary | ICD-10-CM | POA: Diagnosis not present

## 2023-05-02 DIAGNOSIS — I5033 Acute on chronic diastolic (congestive) heart failure: Secondary | ICD-10-CM

## 2023-05-02 DIAGNOSIS — J9621 Acute and chronic respiratory failure with hypoxia: Secondary | ICD-10-CM | POA: Diagnosis present

## 2023-05-02 DIAGNOSIS — D500 Iron deficiency anemia secondary to blood loss (chronic): Secondary | ICD-10-CM | POA: Diagnosis not present

## 2023-05-02 DIAGNOSIS — E876 Hypokalemia: Secondary | ICD-10-CM | POA: Diagnosis present

## 2023-05-02 DIAGNOSIS — D649 Anemia, unspecified: Secondary | ICD-10-CM | POA: Insufficient documentation

## 2023-05-02 DIAGNOSIS — J432 Centrilobular emphysema: Secondary | ICD-10-CM | POA: Diagnosis present

## 2023-05-02 DIAGNOSIS — Z96612 Presence of left artificial shoulder joint: Secondary | ICD-10-CM | POA: Diagnosis present

## 2023-05-02 DIAGNOSIS — J441 Chronic obstructive pulmonary disease with (acute) exacerbation: Secondary | ICD-10-CM | POA: Diagnosis present

## 2023-05-02 DIAGNOSIS — Z96611 Presence of right artificial shoulder joint: Secondary | ICD-10-CM | POA: Diagnosis present

## 2023-05-02 DIAGNOSIS — N183 Chronic kidney disease, stage 3 unspecified: Secondary | ICD-10-CM | POA: Diagnosis present

## 2023-05-02 DIAGNOSIS — Z9981 Dependence on supplemental oxygen: Secondary | ICD-10-CM | POA: Diagnosis not present

## 2023-05-02 DIAGNOSIS — G2581 Restless legs syndrome: Secondary | ICD-10-CM | POA: Diagnosis present

## 2023-05-02 DIAGNOSIS — Z87891 Personal history of nicotine dependence: Secondary | ICD-10-CM | POA: Diagnosis not present

## 2023-05-02 DIAGNOSIS — E785 Hyperlipidemia, unspecified: Secondary | ICD-10-CM | POA: Diagnosis present

## 2023-05-02 DIAGNOSIS — I130 Hypertensive heart and chronic kidney disease with heart failure and stage 1 through stage 4 chronic kidney disease, or unspecified chronic kidney disease: Secondary | ICD-10-CM | POA: Diagnosis present

## 2023-05-02 DIAGNOSIS — T502X5A Adverse effect of carbonic-anhydrase inhibitors, benzothiadiazides and other diuretics, initial encounter: Secondary | ICD-10-CM | POA: Diagnosis present

## 2023-05-02 DIAGNOSIS — Z9889 Other specified postprocedural states: Secondary | ICD-10-CM

## 2023-05-02 DIAGNOSIS — G8929 Other chronic pain: Secondary | ICD-10-CM | POA: Diagnosis present

## 2023-05-02 DIAGNOSIS — F419 Anxiety disorder, unspecified: Secondary | ICD-10-CM | POA: Diagnosis present

## 2023-05-02 DIAGNOSIS — Z8719 Personal history of other diseases of the digestive system: Secondary | ICD-10-CM | POA: Insufficient documentation

## 2023-05-02 DIAGNOSIS — Z8249 Family history of ischemic heart disease and other diseases of the circulatory system: Secondary | ICD-10-CM | POA: Diagnosis not present

## 2023-05-02 DIAGNOSIS — Z7189 Other specified counseling: Secondary | ICD-10-CM | POA: Diagnosis not present

## 2023-05-02 DIAGNOSIS — G4700 Insomnia, unspecified: Secondary | ICD-10-CM | POA: Diagnosis present

## 2023-05-02 LAB — BASIC METABOLIC PANEL
Anion gap: 10 (ref 5–15)
BUN: 19 mg/dL (ref 8–23)
CO2: 32 mmol/L (ref 22–32)
Calcium: 9.9 mg/dL (ref 8.9–10.3)
Chloride: 96 mmol/L — ABNORMAL LOW (ref 98–111)
Creatinine, Ser: 1 mg/dL (ref 0.44–1.00)
GFR, Estimated: 58 mL/min — ABNORMAL LOW (ref >60)
Glucose, Bld: 140 mg/dL — ABNORMAL HIGH (ref 70–99)
Potassium: 4.2 mmol/L (ref 3.5–5.1)
Sodium: 138 mmol/L (ref 135–145)

## 2023-05-02 LAB — CBC
HCT: 29.5 % — ABNORMAL LOW (ref 36.0–46.0)
Hemoglobin: 9.1 g/dL — ABNORMAL LOW (ref 12.0–15.0)
MCH: 27.1 pg (ref 26.0–34.0)
MCHC: 30.8 g/dL (ref 30.0–36.0)
MCV: 87.8 fL (ref 80.0–100.0)
Platelets: 345 10*3/uL (ref 150–400)
RBC: 3.36 MIL/uL — ABNORMAL LOW (ref 3.87–5.11)
RDW: 15.9 % — ABNORMAL HIGH (ref 11.5–15.5)
WBC: 4.2 10*3/uL (ref 4.0–10.5)
nRBC: 0 % (ref 0.0–0.2)

## 2023-05-02 LAB — BRAIN NATRIURETIC PEPTIDE: B Natriuretic Peptide: 340.1 pg/mL — ABNORMAL HIGH (ref 0.0–100.0)

## 2023-05-02 LAB — TROPONIN I (HIGH SENSITIVITY): Troponin I (High Sensitivity): 22 ng/L — ABNORMAL HIGH (ref <18)

## 2023-05-02 MED ORDER — ONDANSETRON HCL 4 MG/2ML IJ SOLN
4.0000 mg | Freq: Four times a day (QID) | INTRAMUSCULAR | Status: DC | PRN
  Filled 2023-05-02: qty 2

## 2023-05-02 MED ORDER — MELATONIN 5 MG PO TABS
2.5000 mg | ORAL_TABLET | Freq: Every day | ORAL | Status: DC
  Administered 2023-05-02 – 2023-05-14 (×13): 2.5 mg via ORAL
  Filled 2023-05-02 (×13): qty 1

## 2023-05-02 MED ORDER — IPRATROPIUM-ALBUTEROL 0.5-2.5 (3) MG/3ML IN SOLN
3.0000 mL | RESPIRATORY_TRACT | Status: DC | PRN
  Administered 2023-05-03: 3 mL via RESPIRATORY_TRACT
  Filled 2023-05-02: qty 3

## 2023-05-02 MED ORDER — MORPHINE SULFATE (PF) 4 MG/ML IV SOLN
6.0000 mg | Freq: Once | INTRAVENOUS | Status: AC
  Administered 2023-05-02: 6 mg via INTRAVENOUS
  Filled 2023-05-02: qty 2

## 2023-05-02 MED ORDER — HYDROMORPHONE HCL 2 MG PO TABS
1.0000 mg | ORAL_TABLET | ORAL | Status: DC | PRN
  Administered 2023-05-02 – 2023-05-15 (×44): 1 mg via ORAL
  Filled 2023-05-02 (×46): qty 1

## 2023-05-02 MED ORDER — METHOCARBAMOL 1000 MG/10ML IJ SOLN
1000.0000 mg | Freq: Four times a day (QID) | INTRAMUSCULAR | Status: DC | PRN
  Administered 2023-05-03: 1000 mg via INTRAVENOUS
  Filled 2023-05-02 (×4): qty 10

## 2023-05-02 MED ORDER — PANTOPRAZOLE SODIUM 40 MG PO TBEC
40.0000 mg | DELAYED_RELEASE_TABLET | Freq: Two times a day (BID) | ORAL | Status: DC
  Administered 2023-05-02 – 2023-05-15 (×25): 40 mg via ORAL
  Filled 2023-05-02 (×27): qty 1

## 2023-05-02 MED ORDER — FUROSEMIDE 10 MG/ML IJ SOLN
40.0000 mg | Freq: Two times a day (BID) | INTRAMUSCULAR | Status: DC
  Administered 2023-05-02 – 2023-05-03 (×3): 40 mg via INTRAVENOUS
  Filled 2023-05-02 (×3): qty 4

## 2023-05-02 MED ORDER — GABAPENTIN 300 MG PO CAPS
600.0000 mg | ORAL_CAPSULE | Freq: Three times a day (TID) | ORAL | Status: DC
  Administered 2023-05-02 – 2023-05-15 (×41): 600 mg via ORAL
  Filled 2023-05-02 (×41): qty 2

## 2023-05-02 MED ORDER — ROPINIROLE HCL 1 MG PO TABS
1.5000 mg | ORAL_TABLET | Freq: Three times a day (TID) | ORAL | Status: DC
  Administered 2023-05-02 – 2023-05-15 (×41): 1.5 mg via ORAL
  Filled 2023-05-02 (×13): qty 2
  Filled 2023-05-02 (×2): qty 1.5
  Filled 2023-05-02: qty 2
  Filled 2023-05-02: qty 1.5
  Filled 2023-05-02 (×6): qty 2
  Filled 2023-05-02 (×3): qty 1.5
  Filled 2023-05-02 (×5): qty 2
  Filled 2023-05-02: qty 1.5
  Filled 2023-05-02 (×12): qty 2

## 2023-05-02 MED ORDER — ALPRAZOLAM 0.25 MG PO TABS
0.2500 mg | ORAL_TABLET | Freq: Every day | ORAL | Status: DC | PRN
  Administered 2023-05-03 – 2023-05-04 (×2): 0.25 mg via ORAL
  Filled 2023-05-02 (×2): qty 1

## 2023-05-02 MED ORDER — BUPRENORPHINE 10 MCG/HR TD PTWK
1.0000 | MEDICATED_PATCH | TRANSDERMAL | Status: DC
  Administered 2023-05-03 – 2023-05-10 (×2): 1 via TRANSDERMAL
  Filled 2023-05-02 (×2): qty 1

## 2023-05-02 MED ORDER — ENOXAPARIN SODIUM 40 MG/0.4ML IJ SOSY
40.0000 mg | PREFILLED_SYRINGE | INTRAMUSCULAR | Status: DC
  Administered 2023-05-02: 40 mg via SUBCUTANEOUS
  Filled 2023-05-02 (×2): qty 0.4

## 2023-05-02 MED ORDER — ACETAMINOPHEN 650 MG RE SUPP: 650.0000 mg | Freq: Four times a day (QID) | RECTAL | Status: DC | PRN

## 2023-05-02 MED ORDER — ROPINIROLE HCL 1 MG PO TABS
1.0000 mg | ORAL_TABLET | Freq: Once | ORAL | Status: AC
Start: 2023-05-02 — End: 2023-05-02
  Administered 2023-05-02: 1 mg via ORAL
  Filled 2023-05-02: qty 1

## 2023-05-02 MED ORDER — LOSARTAN POTASSIUM 25 MG PO TABS
25.0000 mg | ORAL_TABLET | Freq: Every day | ORAL | Status: DC
  Administered 2023-05-02 – 2023-05-11 (×10): 25 mg via ORAL
  Filled 2023-05-02 (×10): qty 1

## 2023-05-02 MED ORDER — AMOXICILLIN-POT CLAVULANATE 875-125 MG PO TABS
1.0000 | ORAL_TABLET | Freq: Two times a day (BID) | ORAL | Status: DC
  Administered 2023-05-02 – 2023-05-08 (×13): 1 via ORAL
  Filled 2023-05-02 (×13): qty 1

## 2023-05-02 MED ORDER — FUROSEMIDE 10 MG/ML IJ SOLN
40.0000 mg | Freq: Once | INTRAMUSCULAR | Status: AC
  Administered 2023-05-02: 40 mg via INTRAVENOUS
  Filled 2023-05-02: qty 4

## 2023-05-02 MED ORDER — ONDANSETRON HCL 4 MG PO TABS
4.0000 mg | ORAL_TABLET | Freq: Four times a day (QID) | ORAL | Status: DC | PRN
  Administered 2023-05-13: 4 mg via ORAL
  Filled 2023-05-02: qty 1

## 2023-05-02 MED ORDER — ACETAMINOPHEN 325 MG PO TABS
650.0000 mg | ORAL_TABLET | Freq: Four times a day (QID) | ORAL | Status: DC | PRN
  Administered 2023-05-02 – 2023-05-14 (×8): 650 mg via ORAL
  Filled 2023-05-02 (×9): qty 2

## 2023-05-02 MED ORDER — GABAPENTIN 300 MG PO CAPS
800.0000 mg | ORAL_CAPSULE | Freq: Once | ORAL | Status: AC
Start: 2023-05-02 — End: 2023-05-02
  Administered 2023-05-02: 600 mg via ORAL
  Filled 2023-05-02: qty 2

## 2023-05-02 NOTE — ED Notes (Signed)
KeKe, tech at patients bedside

## 2023-05-02 NOTE — Assessment & Plan Note (Signed)
left SFA CTO with failed attempt in 01/2019 for revascularization & right calcified iliac artery stenosis  Continue atorvastatin Followed at [facility]

## 2023-05-02 NOTE — Assessment & Plan Note (Addendum)
History of melena 11/18 secondary to perforated gastric ulcer Patient required 2 units emergency release O- blood and FFP Hemoglobin stable

## 2023-05-02 NOTE — ED Notes (Signed)
Pt questioning why she "has to be hooked up to all these cords." This tech explained to pt that she was connected to the cardiac monitor, BP cuff, and the pulse ox to monitor her. Pt c/o rm being too hot. Heat was turned down at this time.

## 2023-05-02 NOTE — H&P (Signed)
History and Physical    Patient: [name] EXB:[id number] DOB: 12-22-1945 DOA: 05/01/2023 DOS: the patient was seen and examined on 05/02/2023 PCP: Ardyth Gal, MD  Patient coming from: SNF  Chief Complaint:  Chief Complaint  Patient presents with   Shortness of Breath    HPI: [name] is a 77 y.o. female with medical history significant for peripheral arterial disease, COPD on chronic 3-4 L home oxygen, hypertension, HFpEF(EF 45-50% 10/2022)) , remote breast cancer, colon cancer s/p resection, chronic back pain on buprenorphine patch and gastric ulcer hospitalized from 11/17 to 05/01/2023 at [facility] for perforated gastric ulcer with melena, requiring emergent surgery discharged the same day to SNF who presents to ED with shortness of breath.  During her hospitalization, per discharge summary, patient had frequent episodes of respiratory distress with mild O2 desaturation with 'negative workup'.  She was discharged on 4 L. Patient states on arrival to rehab none of her medications were available and her shortness of breath worsened in the control with since she arrived prompting the visit to the ED.  She denies chest pain, cough, nausea vomiting or diaphoresis.  Denies any extremity swelling or pain. Of note, patient had ongoing chronic back pain during hier stay and was seen by the chronic pain team and was discharged on buprenorphine high-dose gabapentin and Robaxin. ED course and data review: Tachycardic to 120, tachypneic to 28 on arrival requiring 6 L to maintain sats in the mid 90s.  Afebrile, BP 165/86 Pertinent findings on workup as follows: Troponin 24 and BNP 340 Hemoglobin 9.4 (9.8 on 12/6) Urinalysis unremarkable BMP and Hepatic function panel unremarkable EKG, personally viewed and interpreted showing sinus tachycardia at 106 with no ischemic ST-T wave changes. CTA chest negative for PE, showing bilateral pleural effusions right larger than left Patient  was started on IV Lasix and given a DuoNeb Hospitalist consulted for admission   Review of Systems: As mentioned in the history of present illness. All other systems reviewed and are negative.  Past Medical History:  Diagnosis Date   Acute renal failure superimposed on stage 3 chronic kidney disease (HCC) 10/27/2014   Back pain, chronic    Breast cancer (HCC) 09/09/2010   Cancer of colon (HCC) 05/24/2011   Centrilobular emphysema (HCC) 07/15/2018   CKD (chronic kidney disease), stage III (HCC)    Claudication, intermittent (HCC) 07/15/2018   Depression    GIB (gastrointestinal bleeding)    H/O tobacco use, presenting hazards to health Quit Dec 2019 05/03/2011   50 years, up to 3PPD quit last year.    Hx of varicose veins of lower extremity 07/15/2018   Hyperlipidemia    Hypertension    SDH (subdural hematoma) (HCC)    SOB (shortness of breath) 07/15/2018   Past Surgical History:  Procedure Laterality Date   ABDOMINAL AORTOGRAM W/LOWER EXTREMITY Left 03/18/2019   Procedure: ABDOMINAL AORTOGRAM W/LOWER EXTREMITY;  Surgeon: Yates Decamp, MD;  Location: [facility] IN[facility]IVE CV [facility];  Service: Cardiovascular;  Laterality: Left;   BACK SURGERY     BIOPSY  09/20/2021   Procedure: BIOPSY;  Surgeon: Charlott Rakes, MD;  Location: [facility] ENDOSCOPY;  Service: Gastroenterology;;   BREAST LUMPECTOMY Right 10/10/2010   COLON SURGERY  04/2011   COLONOSCOPY  05/03/2011   Procedure: COLONOSCOPY;  Surgeon: Petra Kuba, MD;  Location: [facility] ENDOSCOPY;  Service: Endoscopy;  Laterality: N/A;   ESOPHAGOGASTRODUODENOSCOPY (EGD) WITH PROPOFOL N/A 09/20/2021   Procedure: ESOPHAGOGASTRODUODENOSCOPY (EGD) WITH PROPOFOL;  Surgeon: Charlott Rakes, MD;  Location: [facility] ENDOSCOPY;  Service: Gastroenterology;  Laterality: N/A;   JOINT REPLACEMENT     R&L total shoulder replacements   LUMBAR DISC SURGERY     PARTIAL HYSTERECTOMY     PERIPHERAL VASCULAR INTERVENTION  03/18/2019   Procedure: PERIPHERAL VASCULAR INTERVENTION;   Surgeon: Yates Decamp, MD;  Location: [facility] INVASIVE CV LAB;  Service: Cardiovascular;;  attempted left SFA   SHOULDER SURGERY     TUMOR REMOVAL  Tumor removed R breast   Social History:  reports that she quit smoking about 3 years ago. Her smoking use included cigarettes. She started smoking about 33 years ago. She has a 7.5 pack-year smoking history. She has never used smokeless tobacco. She reports current alcohol use. She reports that she does not use drugs.  No Known Allergies  Family History  Problem Relation Age of Onset   Diabetes Mother    Hypertension Mother    Cancer Mother        leukemia   Sudden death Mother    Heart attack Mother    Anesthesia problems Neg Hx    Hypotension Neg Hx    Malignant hyperthermia Neg Hx    Pseudochol deficiency Neg Hx    Breast cancer Neg Hx    Lung disease Neg Hx     Prior to Admission medications   Medication Sig Start Date End Date Taking? Authorizing Provider  Acetaminophen (TYLENOL EXTRA STRENGTH PO) Take by mouth.    [provider]  ALPRAZolam Prudy Feeler) 1 MG tablet Take 1 tablet (1 mg total) by mouth 2 (two) times daily as needed. 11/09/20   Marguerita Merles Latif, DO  amLODipine (NORVASC) 5 MG tablet Take 1 tablet (5 mg total) by mouth daily. 07/04/22   Yates Decamp, MD  atorvastatin (LIPITOR) 10 MG tablet Take 1 tablet (10 mg total) by mouth daily. 04/18/23   Yates Decamp, MD  buprenorphine (BUTRANS) 10 MCG/HR PTWK Place 1 patch onto the skin once a week.    [provider]  cholecalciferol (VITAMIN D3) 10 MCG/ML LIQD oral liquid Take 400 Units by mouth daily.    [provider]  cyanocobalamin (VITAMIN B12) 1000 MCG tablet Take 1,000 mcg by mouth daily.    [provider]  doxycycline (VIBRAMYCIN) 100 MG capsule Take 1 capsule (100 mg total) by mouth 2 (two) times daily. 02/26/23   Georgiana Spinner, NP  furosemide (LASIX) 20 MG tablet Take 1 tablet (20 mg total) by mouth daily as needed for fluid or  edema. Patient taking differently: Take 20 mg by mouth daily. 07/04/22   Yates Decamp, MD  gabapentin (NEURONTIN) 300 MG capsule Take 1 capsule (300 mg total) by mouth 3 (three) times daily. 07/04/22   Yates Decamp, MD  hydrochlorothiazide (HYDRODIURIL) 25 MG tablet Take 1 tablet by mouth daily.    [provider]  ipratropium (ATROVENT) 0.02 % nebulizer solution Inhale into the lungs.    [provider]  ipratropium-albuterol (DUONEB) 0.5-2.5 (3) MG/3ML SOLN Take 3 mLs by nebulization every 6 (six) hours as needed. 07/12/22   Cobb, Ruby Cola, NP  Multiple Vitamins-Minerals (MULTIVITAMIN ADULT EXTRA C) CHEW     [provider]  naproxen sodium (ALEVE) 220 MG tablet Take 220 mg by mouth daily as needed (Arthritis).    [provider]  potassium chloride (KLOR-CON) 10 MEQ tablet Take 1 tablet (10 mEq total) by mouth daily as needed (With Furosemide). Take with lasix 07/04/22   Yates Decamp, MD  rOPINIRole (REQUIP) 1  MG tablet Take 1 tablet (1 mg total) by mouth 3 (three) times daily. 03/27/23   Yates Decamp, MD    Physical Exam: Vitals:   05/02/23 0230 05/02/23 0238 05/02/23 0241 05/02/23 0241  BP: 125/67     Pulse: 97 95 95   Resp:  19 19   Temp:    98.1 F (36.7 C)  TempSrc:    Oral  SpO2:   95%   Weight:      Height:       Physical Exam Vitals and nursing note reviewed.  Constitutional:      General: She is not in acute distress. HENT:     Head: Normocephalic and atraumatic.  Cardiovascular:     Rate and Rhythm: Normal rate and regular rhythm.     Heart sounds: Normal heart sounds.  Pulmonary:     Effort: Pulmonary effort is normal.     Breath sounds: Normal breath sounds.  Abdominal:     Palpations: Abdomen is soft.     Tenderness: There is no abdominal tenderness.  Neurological:     Mental Status: Mental status is at baseline.     Labs on Admission: I have personally reviewed following labs and imaging studies  CBC: Recent Labs  Lab  05/01/23 2239  WBC 5.4  HGB 9.4*  HCT 30.0*  MCV 86.5  PLT 379   Basic Metabolic Panel: Recent Labs  Lab 05/01/23 2239  NA 140  K 3.6  CL 100  CO2 28  GLUCOSE 115*  BUN 14  CREATININE 0.94  CALCIUM 10.2   GFR: Estimated Creatinine Clearance: 36.3 mL/min (by C-G formula based on SCr of 0.94 mg/dL). Liver Function Tests: Recent Labs  Lab 05/01/23 2239  AST 25  ALT 18  ALKPHOS 86  BILITOT 0.7  PROT 6.6  ALBUMIN 3.3*   No results for input(s): "LIPASE", "AMYLASE" in the last 168 hours. No results for input(s): "AMMONIA" in the last 168 hours. Coagulation Profile: No results for input(s): "INR", "PROTIME" in the last 168 hours. Cardiac Enzymes: No results for input(s): "CKTOTAL", "CKMB", "CKMBINDEX", "TROPONINI" in the last 168 hours. BNP (last 3 results) No results for input(s): "PROBNP" in the last 8760 hours. HbA1C: No results for input(s): "HGBA1C" in the last 72 hours. CBG: No results for input(s): "GLUCAP" in the last 168 hours. Lipid Profile: No results for input(s): "CHOL", "HDL", "LDLCALC", "TRIG", "CHOLHDL", "LDLDIRECT" in the last 72 hours. Thyroid Function Tests: No results for input(s): "TSH", "T4TOTAL", "FREET4", "T3FREE", "THYROIDAB" in the last 72 hours. Anemia Panel: No results for input(s): "VITAMINB12", "FOLATE", "FERRITIN", "TIBC", "IRON", "RETICCTPCT" in the last 72 hours. Urine analysis:    Component Value Date/Time   COLORURINE COLORLESS (A) 05/01/2023 2239   APPEARANCEUR CLEAR (A) 05/01/2023 2239   LABSPEC 1.006 05/01/2023 2239   PHURINE 9.0 (H) 05/01/2023 2239   GLUCOSEU NEGATIVE 05/01/2023 2239   HGBUR NEGATIVE 05/01/2023 2239   BILIRUBINUR NEGATIVE 05/01/2023 2239   KETONESUR NEGATIVE 05/01/2023 2239   PROTEINUR 30 (A) 05/01/2023 2239   UROBILINOGEN 0.2 11/05/2014 0437   NITRITE NEGATIVE 05/01/2023 2239   LEUKOCYTESUR NEGATIVE 05/01/2023 2239    Radiological Exams on Admission: CT Angio Chest PE W and/or Wo Contrast  Result  Date: 05/02/2023 CLINICAL DATA:  Pulmonary embolism (PE) suspected, high prob hypoxia, recent surgery to gastric ulcer w/perf, copd. Shortness of breath. EXAM: CT ANGIOGRAPHY CHEST WITH CONTRAST TECHNIQUE: Multidetector CT imaging of the chest was performed using the standard protocol during bolus administration of intravenous  contrast. Multiplanar CT image reconstructions and MIPs were obtained to evaluate the vascular anatomy. RADIATION DOSE REDUCTION: This exam was performed according to the departmental dose-optimization program which includes automated exposure control, adjustment of the mA and/or kV according to patient size and/or use of iterative reconstruction technique. CONTRAST:  75mL OMNIPAQUE IOHEXOL 350 MG/ML SOLN COMPARISON:  06/21/2019. FINDINGS: Cardiovascular: No filling defects in the pulmonary arteries to suggest pulmonary emboli. Cardiomegaly. Moderate coronary artery and aortic atherosclerosis. No evidence of aortic aneurysm. Mediastinum/Nodes: No mediastinal, hilar, or axillary adenopathy. Trachea and esophagus are unremarkable. Thyroid unremarkable. Lungs/Pleura: Moderate centrilobular emphysema. Moderate right and small left pleural effusions. Dependent/compressive atelectasis in the lower lobes bilaterally. Upper Abdomen: No acute findings Musculoskeletal: Chest wall soft tissues are unremarkable. No acute bony abnormality. Review of the MIP images confirms the above findings. IMPRESSION: No evidence of pulmonary embolus. Bilateral pleural effusions, right larger than left. Compressive atelectasis in the lower lobes. Coronary artery disease. Aortic Atherosclerosis (ICD10-I70.0) and Emphysema (ICD10-J43.9). Electronically Signed   By: Charlett Nose M.D.   On: 05/02/2023 00:26   DG Chest 1 View  Result Date: 05/01/2023 CLINICAL DATA:  Shortness of breath EXAM: PORTABLE CHEST 1 VIEW COMPARISON:  11/24/2021 FINDINGS: Cardiac shadow is enlarged but accentuated by the portable technique. The  lungs are well aerated bilaterally. Mild right basilar atelectasis is seen. No bony abnormality is noted. Bilateral shoulder replacements are seen. IMPRESSION: Right basilar atelectasis. Electronically Signed   By: Alcide Clever M.D.   On: 05/01/2023 23:05     Data Reviewed: Relevant notes from primary care and specialist visits, past discharge summaries as available in EHR, including Care Everywhere. Prior diagnostic testing as pertinent to current admission diagnoses Updated medications and problem lists for reconciliation ED course, including vitals, labs, imaging, treatment and response to treatment Triage notes, nursing and pharmacy notes and ED provider's notes Notable results as noted in HPI   Assessment and Plan: * Acute on chronic heart failure with preserved ejection fraction (HFpEF) (HCC) Bilateral pleural effusion Acute on chronic respiratory failure with hypoxia Patient requiring 6 L to maintain sats mid 90s up from baseline at 3 to 4 L Increased work of breathing tachypneic to 28 and tachycardic, BNP 340 and CTA showing bilateral pleural effusions(negative for PE) IV Lasix Continue losartan and potassium Daily weights with intake and output monitoring Will update echo  COPD (chronic obstructive pulmonary disease) (HCC) Not acutely exacerbated Continue home inhalers DuoNebs as needed  S/P exploratory laparotomy 04/08/23 for perforated gastric ulcer No acute issues suspected Hemoglobin stable Continue Protonix twice daily Continue Augmentin 875/125 twice daily  HTN (hypertension) Continue home amlodipine 5 mg daily, losartan 25 daily  PAD (peripheral artery disease) (HCC) left SFA CTO with failed attempt in 01/2019 for revascularization & right calcified iliac artery stenosis  Continue atorvastatin Followed at [facility]  Chronic anemia History of melena 11/18 secondary to perforated gastric ulcer Patient required 2 units emergency release O- blood and FFP Hemoglobin  stable  Chronic back pain Per chronic pain team at recent discharge: Continue buprenorphine 10 mcg/hr weekly patch Continue gabapentin 600 mg q8h sch Continue Robaxin 1,000 mg QID  Dilaudid 2 mg.  0.5 mg every 4 as needed pain  Anxiety and depression Restless leg syndrome/insomnia Xanax 0.25 daily as needed anxiety, ropinirole 1 mg 3 times daily, melatonin 3 mg nightly         DVT prophylaxis: Lovenox  Consults: cardiology  Advance Care Planning:   Code Status: Prior   Family Communication:  none  Disposition Plan: Back to previous home environment  Severity of Illness: The appropriate patient status for this patient is INPATIENT. Inpatient status is judged to be reasonable and necessary in order to provide the required intensity of service to ensure the patient's safety. The patient's presenting symptoms, physical exam findings, and initial radiographic and laboratory data in the context of their chronic comorbidities is felt to place them at high risk for further clinical deterioration. Furthermore, it is not anticipated that the patient will be medically stable for discharge from the hospital within 2 midnights of admission.   * I certify that at the point of admission it is my clinical judgment that the patient will require inpatient hospital care spanning beyond 2 midnights from the point of admission due to high intensity of service, high risk for further deterioration and high frequency of surveillance required.*  Author: Andris Baumann, MD 05/02/2023 3:08 AM  For on call review [email or web address].

## 2023-05-02 NOTE — ED Notes (Addendum)
Patient asking for extra salt. Patient informed she is on a heart diet. Helped patient open her seasoning blend

## 2023-05-02 NOTE — ED Notes (Signed)
Call light answered, Pt requesting to have TV Channel turned and Volume turned down.

## 2023-05-02 NOTE — Assessment & Plan Note (Addendum)
Continue home amlodipine 5 mg daily, losartan 25 daily

## 2023-05-02 NOTE — ED Notes (Signed)
Dietary set up lunch tray at patients bedside

## 2023-05-02 NOTE — Assessment & Plan Note (Addendum)
Bilateral pleural effusion Acute on chronic respiratory failure with hypoxia Patient requiring 6 L to maintain sats mid 90s up from baseline at 3 to 4 L Increased work of breathing tachypneic to 28 and tachycardic, BNP 340 and CTA showing bilateral pleural effusions(negative for PE) IV Lasix Continue losartan and potassium Daily weights with intake and output monitoring Will update echo

## 2023-05-02 NOTE — ED Notes (Addendum)
Charlann Noss, tech at bedside with patient. Socks placed on patient. Patient verbalized she is going to take them off and not keep on per [place]

## 2023-05-02 NOTE — ED Notes (Signed)
Patient calling out for pain medication. Reports her pain is 10/10 in abdomen. Patient asked if she needed anything for anxiety and denied.

## 2023-05-02 NOTE — Assessment & Plan Note (Addendum)
Per chronic pain team at recent discharge: Continue buprenorphine 10 mcg/hr weekly patch Continue gabapentin 600 mg q8h sch Continue Robaxin 1,000 mg QID  Dilaudid 2 mg.  0.5 mg every 4 as needed pain

## 2023-05-02 NOTE — ED Notes (Signed)
Patient on cell phone at this time. 

## 2023-05-02 NOTE — Assessment & Plan Note (Signed)
Not acutely exacerbated Continue home inhalers.  DuoNebs as needed 

## 2023-05-02 NOTE — Assessment & Plan Note (Signed)
Restless leg syndrome/insomnia Xanax 0.25 daily as needed anxiety, ropinirole 1 mg 3 times daily, melatonin 3 mg nightly

## 2023-05-02 NOTE — Hospital Course (Signed)
[name] is a 77 y.o. female with medical history significant for peripheral arterial disease, COPD on chronic 3-4 L home oxygen, hypertension, HFpEF(EF 45-50% 10/2022)) , remote breast cancer, colon cancer s/p resection, chronic back pain on buprenorphine patch and gastric ulcer hospitalized from 11/17 to 05/01/2023 at [facility] for perforated gastric ulcer with melena, requiring emergent surgery discharged the same day to SNF who presents to ED with shortness of breath.  Upon arriving to hospital, heart rate was 120, tachypnea 28, was placed on 6 L oxygen for significant hypoxemia.  Troponin was 24, BNP 340.  CT angiogram of the chest to rule out a PE, showed bilateral pleural effusion.  Patient was started on IV Lasix for CHF exacerbation. 12/12.  Volume status much better, changed to oral diuretics.

## 2023-05-02 NOTE — TOC Initial Note (Signed)
Transition of Care [facility]) - Initial/Assessment Note    Patient Details  Name: [name] MRN: [id number] Date of Birth: [birth year]  [facility] of [facility]) CM/SW Contact:    Marquita Palms, LCSW Phone Number: 05/02/2023, 12:24 PM  Clinical Narrative:                  CSW met with patient bedside. Patient reports she is coming from [facility] but she refuses to go back due to not being happy with the services. Patient will be in of new facility for rehab. Patient reports she has neighbors that wil check on her when she is home. [facility] [place]. Patient reports she has RW and shower chair.       Patient Goals and CMS Choice            Expected Discharge Plan and Services                                              Prior Living Arrangements/Services                       Activities of Daily Living      Permission Sought/Granted                  Emotional Assessment              Admission diagnosis:  CHF exacerbation (HCC) [I50.9] Patient Active Problem List   Diagnosis Date Noted   Acute on chronic heart failure with preserved ejection fraction (HFpEF) (HCC) 05/02/2023   Acute on chronic respiratory failure with hypoxia (HCC) 05/02/2023   Chronic anemia 05/02/2023   History of GI bleed 05/02/2023   S/P exploratory laparotomy 04/08/23 for perforated gastric ulcer 05/02/2023   Chronic venous insufficiency 03/05/2023   Dysphagia 09/20/2021   Restless leg syndrome 09/20/2021   B12 deficiency 09/19/2021   Chronic respiratory failure with hypoxia (HCC) 09/18/2021   COPD with acute exacerbation (HCC) 11/08/2020   Elevated troponin 11/08/2020   Chronic kidney disease, stage 3b (HCC) 11/08/2020   Shock (HCC) 06/22/2019   Shock circulatory (HCC) 06/21/2019   Ruptured varicose vein 06/21/2019   COPD (chronic obstructive pulmonary disease) (HCC) 06/21/2019   Chronic diastolic CHF (congestive heart failure)  (HCC) 06/21/2019   SOB (shortness of breath) 07/15/2018   Laboratory examination 07/15/2018   PAD (peripheral artery disease) (HCC) 07/15/2018   Centrilobular emphysema (HCC) 07/15/2018   Hx of varicose veins of lower extremity 07/15/2018   Back pain 01/21/2016   Sacroiliac joint dysfunction of left side 12/27/2015   History of colon cancer 09/21/2015   Iron deficiency anemia 06/04/2015   Syncope 11/05/2014   Narcotic addiction (HCC)    Chronic back pain 10/27/2014   Faintness    Frequent falls 08/22/2014   Facial contusion 08/22/2014   Subdural hematoma (HCC) 08/21/2014   Acute kidney injury superimposed on CKD (HCC) 01/20/2013   Acute mastitis of right breast 04/05/2012   Right shoulder pain 03/27/2012   Breast erythema most likely secondary to mastitis / cellulitis 03/24/2012   Hyponatremia 03/24/2012   Cancer of colon (HCC) 05/24/2011   Cancer of right breast, stage 1 (HCC) 05/24/2011   Colonic mass 05/03/2011   H/O tobacco use, presenting hazards to health 05/03/2011   Weakness generalized 05/01/2011   Acute blood loss anemia  05/01/2011   Falls frequently 05/01/2011   GI bleed 05/01/2011   Hypokalemia 05/01/2011   HTN (hypertension) 05/01/2011   Anxiety and depression 05/01/2011   Lower extremity edema 05/01/2011   PCP:  Ardyth Gal, MD Pharmacy:   [phone] [place] - [address] [place] [address] Phone: [phone] Fax: [phone]     Social Determinants of Health (SDOH) Social History: SDOH Screenings   Food Insecurity: No Food Insecurity (04/17/2023)   Received from [facility], [facility]  Transportation Needs: No Transportation Needs (04/17/2023)   Received from [facility], [facility]  Financial Resource Strain: Low Risk  (04/17/2023)   Received from [facility], [facility]  Tobacco Use: High Risk (04/24/2023)   Received from [facility]   SDOH Interventions:     Readmission  Risk Interventions    05/02/2023   12:22 PM 09/22/2021    1:38 PM  Readmission Risk Prevention Plan  Transportation Screening Complete Complete  PCP or Specialist Appt within 3-5 Days Complete   HRI or Home Care Consult Complete   Social Work Consult for Recovery Care Planning/Counseling Complete   Palliative Care Screening Not Applicable   Medication Review Ocean[employer]) Complete Complete  HRI or Home Care Consult  Complete  SW Recovery Care/Counseling Consult  Complete  Palliative Care Screening  Not Applicable  Skilled Nursing Facility  Not Applicable

## 2023-05-02 NOTE — Assessment & Plan Note (Addendum)
No acute issues suspected Hemoglobin stable Continue Protonix twice daily Continue Augmentin 875/125 twice daily

## 2023-05-02 NOTE — Progress Notes (Signed)
Progress Note   Patient: [name] HKV:[id number] DOB: 05/11/1946 DOA: 05/01/2023     0 DOS: the patient was seen and examined on 05/02/2023   Brief hospital course: [name] is a 77 y.o. female with medical history significant for peripheral arterial disease, COPD on chronic 3-4 L home oxygen, hypertension, HFpEF(EF 45-50% 10/2022)) , remote breast cancer, colon cancer s/p resection, chronic back pain on buprenorphine patch and gastric ulcer hospitalized from 11/17 to 05/01/2023 at [facility] for perforated gastric ulcer with melena, requiring emergent surgery discharged the same day to SNF who presents to ED with shortness of breath.  Upon arriving to hospital, heart rate was 120, tachypnea 28, was placed on 6 L oxygen for significant hypoxemia.  Troponin was 24, BNP 340.  CT angiogram of the chest to rule out a PE, showed bilateral pleural effusion.  Patient was started on IV Lasix for CHF exacerbation.    Principal Problem:   Acute on chronic heart failure with preserved ejection fraction (HFpEF) (HCC) Active Problems:   Acute on chronic respiratory failure with hypoxia (HCC)   COPD (chronic obstructive pulmonary disease) (HCC)   S/P exploratory laparotomy 04/08/23 for perforated gastric ulcer   HTN (hypertension)   PAD (peripheral artery disease) (HCC)   Anxiety and depression   Chronic back pain   Chronic anemia   Assessment and Plan: * Acute on chronic heart failure with preserved ejection fraction (HFpEF) (HCC) Bilateral pleural effusion Acute on chronic respiratory failure with hypoxia Patient requiring 6 L to maintain sats mid 90s up from baseline at 3 to 4 L Increased work of breathing tachypneic to 28 and tachycardic, BNP 340 and CTA showing bilateral pleural effusions(negative for PE) Echocardiogram 1 year ago showed ejection fraction 50 to 55%, repeat echocardiogram pending. Patient has been receiving IV Lasix, feels already better today.  Continuing  oxygen.  COPD (chronic obstructive pulmonary disease) (HCC) No exacerbation.  S/P exploratory laparotomy 04/08/23 for perforated gastric ulcer No acute issues suspected Hemoglobin stable Continue Protonix twice daily Continue Augmentin 875/125 twice daily  HTN (hypertension) Continue home amlodipine 5 mg daily, losartan 25 daily  PAD (peripheral artery disease) (HCC) left SFA CTO with failed attempt in 01/2019 for revascularization & right calcified iliac artery stenosis  Continue atorvastatin Followed at [facility]  Chronic anemia History of melena 11/18 secondary to perforated gastric ulcer Hemoglobin 9.1 today, no need for transfusion.  Check B12 and iron level.  Chronic back pain Continue home medicines.  Anxiety and depression Restless leg syndrome/insomnia Xanax 0.25 daily as needed anxiety, ropinirole 1 mg 3 times daily, melatonin 3 mg nightly        Subjective:  Patient feels better today, less short of breath.  Physical Exam: Vitals:   05/02/23 1000 05/02/23 1100 05/02/23 1115 05/02/23 1300  BP: 111/72 (!) 143/71  123/67  Pulse: (!) 43  80 63  Resp: 18 15 19 17   Temp:      TempSrc:      SpO2: 96%   100%  Weight:      Height:       General exam: Appears calm and comfortable  Respiratory system: Clear to auscultation. Respiratory effort normal. Cardiovascular system: S1 & S2 heard, RRR. No JVD, murmurs, rubs, gallops or clicks. No pedal edema. Gastrointestinal system: Abdomen is nondistended, soft and nontender. No organomegaly or masses felt. Normal bowel sounds heard. Central nervous system: Alert and oriented. No focal neurological deficits. Extremities: Symmetric 5 x 5 power. Skin: No rashes,  lesions or ulcers Psychiatry: Judgement and insight appear normal. Mood & affect appropriate.    Data Reviewed:  CT chest reviewed, lab results reviewed.  Family Communication: None  Disposition: Status is: Inpatient Remains inpatient appropriate because:  Part of disease, IV treatment.     Time spent: 35 minutes  Author: Marrion Coy, MD 05/02/2023 2:21 PM  For on call review [email or web address].

## 2023-05-03 DIAGNOSIS — N179 Acute kidney failure, unspecified: Secondary | ICD-10-CM

## 2023-05-03 DIAGNOSIS — I5033 Acute on chronic diastolic (congestive) heart failure: Secondary | ICD-10-CM | POA: Diagnosis not present

## 2023-05-03 DIAGNOSIS — L8990 Pressure ulcer of unspecified site, unspecified stage: Secondary | ICD-10-CM | POA: Insufficient documentation

## 2023-05-03 DIAGNOSIS — D500 Iron deficiency anemia secondary to blood loss (chronic): Secondary | ICD-10-CM | POA: Diagnosis not present

## 2023-05-03 LAB — BASIC METABOLIC PANEL
Anion gap: 7 (ref 5–15)
BUN: 25 mg/dL — ABNORMAL HIGH (ref 8–23)
CO2: 37 mmol/L — ABNORMAL HIGH (ref 22–32)
Calcium: 9.9 mg/dL (ref 8.9–10.3)
Chloride: 95 mmol/L — ABNORMAL LOW (ref 98–111)
Creatinine, Ser: 1.26 mg/dL — ABNORMAL HIGH (ref 0.44–1.00)
GFR, Estimated: 44 mL/min — ABNORMAL LOW (ref >60)
Glucose, Bld: 103 mg/dL — ABNORMAL HIGH (ref 70–99)
Potassium: 3.8 mmol/L (ref 3.5–5.1)
Sodium: 139 mmol/L (ref 135–145)

## 2023-05-03 LAB — CBC
HCT: 28 % — ABNORMAL LOW (ref 36.0–46.0)
Hemoglobin: 8.3 g/dL — ABNORMAL LOW (ref 12.0–15.0)
MCH: 26.7 pg (ref 26.0–34.0)
MCHC: 29.6 g/dL — ABNORMAL LOW (ref 30.0–36.0)
MCV: 90 fL (ref 80.0–100.0)
Platelets: 307 10*3/uL (ref 150–400)
RBC: 3.11 MIL/uL — ABNORMAL LOW (ref 3.87–5.11)
RDW: 16.2 % — ABNORMAL HIGH (ref 11.5–15.5)
WBC: 4.6 10*3/uL (ref 4.0–10.5)
nRBC: 0 % (ref 0.0–0.2)

## 2023-05-03 LAB — FERRITIN: Ferritin: 22 ng/mL (ref 11–307)

## 2023-05-03 LAB — VITAMIN B12: Vitamin B-12: 600 pg/mL (ref 180–914)

## 2023-05-03 LAB — IRON AND TIBC
Iron: 26 ug/dL — ABNORMAL LOW (ref 28–170)
Saturation Ratios: 6 % — ABNORMAL LOW (ref 10.4–31.8)
TIBC: 466 ug/dL — ABNORMAL HIGH (ref 250–450)
UIBC: 440 ug/dL

## 2023-05-03 LAB — ECHOCARDIOGRAM COMPLETE
Area-P 1/2: 3.98 cm2
Height: 58 in
S' Lateral: 3.3 cm
Weight: 1808 [oz_av]

## 2023-05-03 LAB — MAGNESIUM: Magnesium: 1.9 mg/dL (ref 1.7–2.4)

## 2023-05-03 MED ORDER — ENOXAPARIN SODIUM 30 MG/0.3ML IJ SOSY
30.0000 mg | PREFILLED_SYRINGE | INTRAMUSCULAR | Status: DC
  Administered 2023-05-03: 30 mg via SUBCUTANEOUS
  Filled 2023-05-03: qty 0.3

## 2023-05-03 MED ORDER — POLYSACCHARIDE IRON COMPLEX 150 MG PO CAPS
150.0000 mg | ORAL_CAPSULE | Freq: Every day | ORAL | Status: DC
  Administered 2023-05-03 – 2023-05-15 (×13): 150 mg via ORAL
  Filled 2023-05-03 (×13): qty 1

## 2023-05-03 MED ORDER — TORSEMIDE 20 MG PO TABS
40.0000 mg | ORAL_TABLET | Freq: Every day | ORAL | Status: DC
  Administered 2023-05-04 – 2023-05-06 (×3): 40 mg via ORAL
  Filled 2023-05-03 (×3): qty 2

## 2023-05-03 NOTE — Progress Notes (Signed)
Progress Note   Patient: [name] ZOX:[id number] DOB: [birth year] DOA: 05/01/2023     1 DOS: the patient was seen and examined on 05/03/2023   Brief hospital course: [name] is a 77 y.o. female with medical history significant for peripheral arterial disease, COPD on chronic 3-4 L home oxygen, hypertension, HFpEF(EF 45-50% 10/2022)) , remote breast cancer, colon cancer s/p resection, chronic back pain on buprenorphine patch and gastric ulcer hospitalized from 11/17 to 05/01/2023 at [facility] for perforated gastric ulcer with melena, requiring emergent surgery discharged the same day to SNF who presents to ED with shortness of breath.  Upon arriving to hospital, heart rate was 120, tachypnea 28, was placed on 6 L oxygen for significant hypoxemia.  Troponin was 24, BNP 340.  CT angiogram of the chest to rule out a PE, showed bilateral pleural effusion.  Patient was started on IV Lasix for CHF exacerbation. 12/12.  Volume status much better, changed to oral diuretics.   Principal Problem:   Acute on chronic heart failure with preserved ejection fraction (HFpEF) (HCC) Active Problems:   Acute on chronic respiratory failure with hypoxia (HCC)   COPD (chronic obstructive pulmonary disease) (HCC)   S/P exploratory laparotomy 04/08/23 for perforated gastric ulcer   HTN (hypertension)   PAD (peripheral artery disease) (HCC)   Anxiety and depression   Chronic back pain   Chronic anemia   Assessment and Plan: * Acute on chronic heart failure with preserved ejection fraction (HFpEF) (HCC) Bilateral pleural effusion Acute on chronic respiratory failure with hypoxia Patient requiring 6 L to maintain sats mid 90s up from baseline at 3.5L. Increased work of breathing tachypneic to 28 and tachycardic, BNP 340 and CTA showing bilateral pleural effusions(negative for PE) Echocardiogram 1 year ago showed ejection fraction 50 to 55%, repeat echocardiogram pending. Patient was treated  with IV Lasix, volume status much better today.  Renal function getting worse, hold her IV Lasix, restart oral torsemide tomorrow. Oxygenation is better, oxygen weaned down to 3.5 L.  Acute kidney injury secondary to diuretics. Renal function is worse after diuretics, recheck level tomorrow after holding Lasix today.   COPD (chronic obstructive pulmonary disease) (HCC) No exacerbation.   S/P exploratory laparotomy 04/08/23 for perforated gastric ulcer No acute issues suspected Hemoglobin stable Continue Protonix twice daily Continue Augmentin 875/125 twice daily   HTN (hypertension) Continue home amlodipine 5 mg daily, losartan 25 daily   PAD (peripheral artery disease) (HCC) left SFA CTO with failed attempt in 01/2019 for revascularization & right calcified iliac artery stenosis  Continue atorvastatin Followed at [facility]   Chronic iron deficiency anemia History of melena 11/18 secondary to perforated gastric ulcer Patient has mild iron deficiency, B12 normal.  Will start oral iron.   Chronic back pain Continue home medicines.   Anxiety and depression Restless leg syndrome/insomnia Xanax 0.25 daily as needed anxiety, ropinirole 1 mg 3 times daily, melatonin 3 mg nightly    Pressure ulcers; POA Pressure Injury 05/02/23 Sacrum Stage 1 -  Intact skin with non-blanchable redness of a localized area usually over a bony prominence. (Active)  05/02/23 2039  Location: Sacrum  Location Orientation:   Staging: Stage 1 -  Intact skin with non-blanchable redness of a localized area usually over a bony prominence.  Wound Description (Comments):   Present on Admission:           Subjective: Patient doing much better today, short of breath with exertion.  Cough, nonproductive.  Physical Exam: Vitals:  05/03/23 0500 05/03/23 0600 05/03/23 0613 05/03/23 0900  BP: (!) 128/45 (!) 106/47  (!) 113/96  Pulse: 64 65  65  Resp: 11 11  19   Temp:   97.9 F (36.6 C)   TempSrc:   Axillary    SpO2: 100% 100%  100%  Weight:      Height:       General exam: Appears calm and comfortable  Respiratory system: Decreased breathing sounds. Respiratory effort normal. Cardiovascular system: S1 & S2 heard, RRR. No JVD, murmurs, rubs, gallops or clicks. No pedal edema. Gastrointestinal system: Abdomen is nondistended, soft and nontender. No organomegaly or masses felt. Normal bowel sounds heard. Central nervous system: Alert and oriented. No focal neurological deficits. Extremities: Symmetric 5 x 5 power. Skin: No rashes, lesions or ulcers Psychiatry: Judgement and insight appear normal. Mood & affect appropriate.    Data Reviewed:  Lab results reviewed.  Family Communication: None  Disposition: Status is: Inpatient Remains inpatient appropriate because: Severity of disease, unsafe discharge.     Time spent: 35 minutes  Author: Marrion Coy, MD 05/03/2023 11:38 AM  For on call review [email or web address].

## 2023-05-03 NOTE — Progress Notes (Signed)
77 y.o. female with medical history significant for peripheral arterial disease, COPD on chronic 3-4 L home oxygen, hypertension, HFpEF(EF 45-50% 10/2022)) admitted for CHF/Pleural effusion and SOB. She's was complaining of SOB this evening, Im giving her a breathing treatment . Vitals 138/66, SPO2 98% ON 3Liters. but she did have increased work of breathing. Informed NP on call. She received xanax at 8:50pm

## 2023-05-03 NOTE — Progress Notes (Signed)
PHARMACIST - PHYSICIAN COMMUNICATION  CONCERNING:  Enoxaparin (Lovenox) for DVT Prophylaxis   ASSESSMENT: Patient was prescribed enoxaparin 40 mg subcutaneously every 24 hours for VTE prophylaxis.   Body mass index is 23.62 kg/m.  Estimated Creatinine Clearance: 27 mL/min (A) (by C-G formula based on SCr of 1.26 mg/dL (H)).  Based on [facility] policy, patient qualifies for enoxaparin dosing of 30 mg every 24 hours because their creatinine clearance is <30 mL/min.  PLAN: Pharmacy has adjusted enoxaparin dose per [facility] policy.  Description: Patient is now receiving enoxaparin 30 mg subcutaneously every 24 hours.  Will M. Dareen Piano, PharmD Clinical Pharmacist 05/03/2023 3:01 PM

## 2023-05-03 NOTE — Evaluation (Signed)
Occupational Therapy Evaluation Patient Details Name: [name] MRN: [id number] DOB: [birth year] Today's Date: 05/03/2023   History of Present Illness 77 y.o. female with medical history significant for peripheral arterial disease, COPD on chronic 3-4 L home oxygen, hypertension, HFpEF(EF 45-50% 10/2022)) , remote breast cancer, colon cancer s/p resection, chronic back pain on buprenorphine patch and gastric ulcer hospitalized from 11/17 to 05/01/2023 at [facility] for perforated gastric ulcer with melena, requiring emergent surgery discharged the same day to SNF who presents to ED with shortness of breath. Patient was started on IV Lasix for CHF exacerbation.   Clinical Impression   Pt was seen for OT evaluation this date. Prior to hospital admission, pt reports she was recently at [facility] and had been transferred to [facility] where she was there <6hrs before being transferred to [facility] ED. Prior to recent hospital admission in November, pt notes she was borrowing a rollator and 2WW from a friend for mobility and was generally independent with ADL/IADL but things were getting much more difficult. She admits that a previous physician suggested she look into ALF several months ago. Pt reports she no longer drives and has a best friend who takes her places and that she'd like to live closer to her in [place]. Pt presents to acute OT demonstrating impaired ADL performance and functional mobility 2/2 significant abdominal and low back pain, decreased strength, activity tolerance, and balance (See OT problem list for additional functional deficits). Pt currently requires CGA for bed mobility and increased time/effort to complete. Pt sat EOB unassisted with BUE vs UE support on EOB for ~2min with VSS on 3.5L but then needed to return to bed due to 10/10 pain that was not improving. RN notified. Pt would benefit from skilled OT services to address noted impairments and functional limitations (see  below for any additional details) in order to maximize safety and independence while minimizing falls risk and caregiver burden.     If plan is discharge home, recommend the following: A little help with walking and/or transfers;A lot of help with bathing/dressing/bathroom;Assistance with cooking/housework;Assist for transportation;Help with stairs or ramp for entrance    Functional Status Assessment  Patient has had a recent decline in their functional status and demonstrates the ability to make significant improvements in function in a reasonable and predictable amount of time.  Equipment Recommendations  Other (comment) (defer to next venue; do anticipate need for 2WW, possibly a BSC)    Recommendations for Other Services       Precautions / Restrictions Precautions Precautions: Fall Precaution Comments: recent ex lap Restrictions Weight Bearing Restrictions Per Provider Order: No      Mobility Bed Mobility Overal bed mobility: Needs Assistance Bed Mobility: Supine to Sit, Sit to Supine     Supine to sit: Contact guard, HOB elevated, Used rails Sit to supine: Contact guard assist   General bed mobility comments: increased effort/time with pain    Transfers                   General transfer comment: declined 2/2 pain      Balance Overall balance assessment: Needs assistance Sitting-balance support: Single extremity supported, Bilateral upper extremity supported, Feet unsupported Sitting balance-Leahy Scale: Fair                                     ADL either performed or assessed with clinical judgement  ADL Overall ADL's : Needs assistance/impaired                                       General ADL Comments: Pt required MAX A to don socks, citing abdominal and low back pain as limiting factors to being able to complete LB dressing herself. Anticipate increased assist for ADL transfers and mobility given effort given to  complete sup<>sit EOB and pain.     Vision         Perception         Praxis         Pertinent Vitals/Pain Pain Assessment Pain Assessment: 0-10 Pain Score: 10-Worst pain ever Pain Location: abdomen and low back Pain Descriptors / Indicators: Aching Pain Intervention(s): Limited activity within patient's tolerance, Monitored during session, Repositioned, Patient requesting pain meds-RN notified     Extremity/Trunk Assessment Upper Extremity Assessment Upper Extremity Assessment: Generalized weakness   Lower Extremity Assessment Lower Extremity Assessment: Generalized weakness       Communication Communication Communication: No apparent difficulties   Cognition Arousal: Alert Behavior During Therapy: WFL for tasks assessed/performed Overall Cognitive Status: Within Functional Limits for tasks assessed                                       General Comments       Exercises Other Exercises Other Exercises: Pt educated in cog behavioral pain coping strategies to support pain control   Shoulder Instructions      Home Living Family/patient expects to be discharged to:: Skilled nursing facility                                 Additional Comments: Pt reports only being at STR <6 hrs after [facility] hospitalization and then came to [facility] ED.      Prior Functioning/Environment Prior Level of Function : Independent/Modified Independent             Mobility Comments: has been borrowing a RW and rollator from a friend for mobility ADLs Comments: No longer drives and her best friend takes her to dr and grocery store; was able to complete ADL and IADL prior to [facility] admission but becoming much more difficult to perform. Pt reports previous MD suggested an ALF several months ago. On 3.5L at home as needed per pt's understanding (pt notes she's not sure if she's supposed to wear at night).        OT Problem List: Decreased  strength;Pain;Cardiopulmonary status limiting activity;Decreased activity tolerance;Impaired balance (sitting and/or standing);Decreased knowledge of use of DME or AE      OT Treatment/Interventions: Self-care/ADL training;Therapeutic exercise;Therapeutic activities;DME and/or AE instruction;Energy conservation;Patient/family education;Balance training    OT Goals(Current goals can be found in the care plan section) Acute Rehab OT Goals Patient Stated Goal: have less pain OT Goal Formulation: With patient Time For Goal Achievement: 05/17/23 Potential to Achieve Goals: Good ADL Goals Pt Will Perform Lower Body Dressing: with modified independence;with adaptive equipment;sit to/from stand Pt Will Transfer to Toilet: with modified independence;ambulating (LRAD) Pt Will Perform Toileting - Clothing Manipulation and hygiene: with modified independence Additional ADL Goal #1: Pt will verbalize plan to implement at least 1 learned cognitive behavioral pain coping strategy into daily ADL/mobility routines to  improve overall pain mgt. Additional ADL Goal #2: Pt will verbalize plan to implement at least 2 learned ECS into daily ADL/mobility routines to improve safety and independence while minimizing SOB/over exertion.  OT Frequency: Min 1X/week    Co-evaluation              AM-PAC OT "6 Clicks" Daily Activity     Outcome Measure Help from another person eating meals?: None Help from another person taking care of personal grooming?: A Little Help from another person toileting, which includes using toliet, bedpan, or urinal?: A Little Help from another person bathing (including washing, rinsing, drying)?: A Lot Help from another person to put on and taking off regular upper body clothing?: A Little Help from another person to put on and taking off regular lower body clothing?: A Lot 6 Click Score: 17   End of Session Equipment Utilized During Treatment: Oxygen Nurse Communication: Patient  requests pain meds  Activity Tolerance: Patient limited by pain Patient left: in bed;with call bell/phone within reach;with nursing/sitter in room  OT Visit Diagnosis: Other abnormalities of gait and mobility (R26.89);Muscle weakness (generalized) (M62.81);Pain Pain - Right/Left:  (abdomen and low back)                Time: 6045-4098 OT Time Calculation (min): 31 min Charges:  OT General Charges $OT Visit: 1 Visit OT Evaluation $OT Eval Moderate Complexity: 1 Mod OT Treatments $Therapeutic Activity: 8-22 mins  Arman Filter., MPH, MS, OTR/L ascom [phone] 05/03/23, 1:22 PM

## 2023-05-03 NOTE — ED Notes (Signed)
Labns drawn. Patient repositioned in bed for comfort. VSS, CCM in use, call light within reach. Patient provided with cranberry juice at request.

## 2023-05-03 NOTE — Evaluation (Addendum)
Physical Therapy Evaluation Patient Details Name: [name] MRN: [id number] DOB: [birth year] Today's Date: 05/03/2023  History of Present Illness  77 y.o. female with medical history significant for peripheral arterial disease, COPD on chronic 3-4 L home oxygen, hypertension, HFpEF(EF 45-50% 10/2022)) , remote breast cancer, colon cancer s/p resection, chronic back pain on buprenorphine patch and gastric ulcer hospitalized from 11/17 to 05/01/2023 at [facility] for perforated gastric ulcer with melena, requiring emergent surgery discharged the same day to SNF who presents to ED with shortness of breath. Patient was started on IV Lasix for CHF exacerbation.  Clinical Impression  Pt was pleasant and willing to participate during the session and put forth good effort throughout. Pt received in bed, A,O x4. Pt completed bed mobility with Superv for safety, requiring extra time. Pt performed transfers with Superv, also taking extra time but aided by elevated ED bed height. Pt amb in hallway with RW and CGA, utilizing slow gait with minimal unsteadiness but overall safe throughout. Pt's vitals were monitored during session and remained WNL on 4L O2, SpO2 > 93% throughout, pt left on 3.5L O2 as found. Pt reported only adverse symptoms of mild stomach and low back pain. Pt will benefit from continued PT services upon discharge to safely address deficits listed in patient problem list for decreased caregiver assistance and eventual return to PLOF.        If plan is discharge home, recommend the following: Assistance with cooking/housework;Help with stairs or ramp for entrance;Assist for transportation   Can travel by private vehicle        Equipment Recommendations Other (comment) (TBD at next venue of care)  Recommendations for Other Services       Functional Status Assessment Patient has had a recent decline in their functional status and demonstrates the ability to make significant  improvements in function in a reasonable and predictable amount of time.     Precautions / Restrictions Precautions Precautions: Fall Precaution Comments: recent ex lap Restrictions Weight Bearing Restrictions Per Provider Order: No      Mobility  Bed Mobility Overal bed mobility: Needs Assistance Bed Mobility: Supine to Sit, Sit to Supine     Supine to sit: HOB elevated, Supervision Sit to supine: Supervision, HOB elevated   General bed mobility comments: self-selecting slow sequencing requiring extra time, minimal difficulty to perform, and no real cuing needed    Transfers Overall transfer level: Needs assistance Equipment used: Rolling walker (2 wheels) Transfers: Sit to/from Stand Sit to Stand: Supervision, From elevated surface           General transfer comment: gravity-assisted 2/2 ED bed height and pt stature, extra time needed to set up and complete, cuing for hand placement; some difficulty transferring back to bed at end of session requiring effort    Ambulation/Gait Ambulation/Gait assistance: Contact guard assist Gait Distance (Feet): 200 Feet Assistive device: Rolling walker (2 wheels) Gait Pattern/deviations: Step-through pattern, Decreased step length - right, Decreased step length - left, Decreased stride length, Trunk flexed Gait velocity: decreased     General Gait Details: consistent but slow cadence, making wide turns with RW inefficiently but able to manage with light directing for RW, taking standing rest break in the middle of distance, cuing for posture given pt consistently exhibiting flexed trunk; pt reporting back pain and self-selecting to stop walking when responding to questions, SpO2 > 93% throughout on 4L O2  Stairs            Wheelchair  Mobility     Tilt Bed    Modified Rankin (Stroke Patients Only)       Balance Overall balance assessment: Needs assistance Sitting-balance support: Bilateral upper extremity supported,  Feet unsupported Sitting balance-Leahy Scale: Fair     Standing balance support: Bilateral upper extremity supported, During functional activity Standing balance-Leahy Scale: Fair Standing balance comment: using RW throughout session, steady in static stand, minimal-no unsteadiness during dynamic activities with RW                             Pertinent Vitals/Pain Pain Assessment Pain Assessment: Faces Faces Pain Scale: Hurts a little bit Pain Location: abdomen and low back Pain Descriptors / Indicators: Aching, Sore Pain Intervention(s): Monitored during session    Home Living Family/patient expects to be discharged to:: Skilled nursing facility                   Additional Comments: Pt reports only being at STR <6 hrs after [facility] hospitalization and then came to [facility] ED. Prior to hospitalizations/surgeries was living alone - MD suggested ALF several months ago    Prior Function Prior Level of Function : Independent/Modified Independent             Mobility Comments: Mod I household-distance ambulator with RW/rollator most of the time (borrowing ADs from friend) ADLs Comments: best friend had been taking her to appointments and the grocery store; was able to complete ADLs and IADLs prior to [facility] admission but becoming much more difficult to perform. On 3.5L O2 at home as needed per pt's understanding (pt notes she's not sure if she's supposed to wear at night).     Extremity/Trunk Assessment   Upper Extremity Assessment Upper Extremity Assessment: Overall WFL for tasks assessed;Generalized weakness    Lower Extremity Assessment Lower Extremity Assessment: Generalized weakness    Cervical / Trunk Assessment Cervical / Trunk Assessment: Kyphotic  Communication   Communication Communication: No apparent difficulties Cueing Techniques: Verbal cues;Visual cues  Cognition Arousal: Alert Behavior During Therapy: WFL for tasks assessed/performed Overall  Cognitive Status: Within Functional Limits for tasks assessed                                          General Comments      Exercises     Assessment/Plan    PT Assessment Patient needs continued PT services  PT Problem List Decreased strength;Decreased mobility;Decreased range of motion;Decreased activity tolerance;Decreased balance;Decreased knowledge of use of DME;Decreased coordination;Decreased safety awareness;Cardiopulmonary status limiting activity;Pain       PT Treatment Interventions DME instruction;Therapeutic exercise;Balance training;Gait training;Stair training;Functional mobility training;Therapeutic activities;Patient/family education    PT Goals (Current goals can be found in the Care Plan section)  Acute Rehab PT Goals Patient Stated Goal: regain independence PT Goal Formulation: With patient Time For Goal Achievement: 05/16/23 Potential to Achieve Goals: Good    Frequency Min 1X/week     Co-evaluation               AM-PAC PT "6 Clicks" Mobility  Outcome Measure Help needed turning from your back to your side while in a flat bed without using bedrails?: None Help needed moving from lying on your back to sitting on the side of a flat bed without using bedrails?: A Little Help needed moving to and from a bed to  a chair (including a wheelchair)?: A Little Help needed standing up from a chair using your arms (e.g., wheelchair or bedside chair)?: A Little Help needed to walk in hospital room?: A Little Help needed climbing 3-5 steps with a railing? : A Little 6 Click Score: 19    End of Session Equipment Utilized During Treatment: Gait belt;Oxygen (3.5-4 L O2) Activity Tolerance: Patient tolerated treatment well Patient left: in bed;with call bell/phone within reach;with nursing/sitter in room Nurse Communication: Mobility status PT Visit Diagnosis: Muscle weakness (generalized) (M62.81);Unsteadiness on feet (R26.81)    Time:  6962-9528 PT Time Calculation (min) (ACUTE ONLY): 35 min   Charges:               Rosiland Oz SPT 05/03/23, 3:47 PM This entire session was performed under direct supervision and direction of a licensed therapist/therapist assistant. I have personally read, edited and approve of the note as written.  Loran Senters, DPT

## 2023-05-03 NOTE — NC FL2 (Signed)
[place] MEDICAID FL2 LEVEL OF CARE FORM     IDENTIFICATION  Patient Name: [name] Birthdate: 13-Aug-1945 Sex: female Admission Date (Current Location): 05/01/2023  [facility] and [place] Number:  Chiropodist and Address:  [facility], [address] [place], [place] [address]      Provider Number: [id number]  Attending Physician Name and Address:  Marrion Coy, MD  Relative Name and Phone Number:  Doristine Mango [facility])  [phone] [facility])    Current Level of Care: Hospital Recommended Level of Care: Skilled Nursing Facility Prior Approval Number:    Date Approved/Denied:   PASRR Number: [id number]  Discharge Plan: SNF    Current Diagnoses: Patient Active Problem List   Diagnosis Date Noted   Pressure injury of skin 05/03/2023   Acute on chronic heart failure with preserved ejection fraction (HFpEF) (HCC) 05/02/2023   Acute on chronic respiratory failure with hypoxia (HCC) 05/02/2023   Chronic anemia 05/02/2023   History of GI bleed 05/02/2023   S/P exploratory laparotomy 04/08/23 for perforated gastric ulcer 05/02/2023   Chronic venous insufficiency 03/05/2023   Dysphagia 09/20/2021   Restless leg syndrome 09/20/2021   B12 deficiency 09/19/2021   Chronic respiratory failure with hypoxia (HCC) 09/18/2021   COPD with acute exacerbation (HCC) 11/08/2020   Elevated troponin 11/08/2020   Chronic kidney disease, stage 3b (HCC) 11/08/2020   Shock (HCC) 06/22/2019   Shock circulatory (HCC) 06/21/2019   Ruptured varicose vein 06/21/2019   COPD (chronic obstructive pulmonary disease) (HCC) 06/21/2019   Chronic diastolic CHF (congestive heart failure) (HCC) 06/21/2019   SOB (shortness of breath) 07/15/2018   Laboratory examination 07/15/2018   PAD (peripheral artery disease) (HCC) 07/15/2018   Centrilobular emphysema (HCC) 07/15/2018   Hx of varicose veins of lower extremity 07/15/2018   Back pain 01/21/2016    Sacroiliac joint dysfunction of left side 12/27/2015   History of colon cancer 09/21/2015   Iron deficiency anemia 06/04/2015   Syncope 11/05/2014   Narcotic addiction (HCC)    Chronic back pain 10/27/2014   Faintness    Frequent falls 08/22/2014   Facial contusion 08/22/2014   Subdural hematoma (HCC) 08/21/2014   AKI (acute kidney injury) (HCC) 01/20/2013   Acute mastitis of right breast 04/05/2012   Right shoulder pain 03/27/2012   Breast erythema most likely secondary to mastitis / cellulitis 03/24/2012   Hyponatremia 03/24/2012   Cancer of colon (HCC) 05/24/2011   Cancer of right breast, stage 1 (HCC) 05/24/2011   Colonic mass 05/03/2011   H/O tobacco use, presenting hazards to health 05/03/2011   Weakness generalized 05/01/2011   Acute blood loss anemia 05/01/2011   Falls frequently 05/01/2011   GI bleed 05/01/2011   Hypokalemia 05/01/2011   HTN (hypertension) 05/01/2011   Anxiety and depression 05/01/2011   Lower extremity edema 05/01/2011    Orientation RESPIRATION BLADDER Height & Weight     Self, Time, Situation, Place    Continent Weight: 113 lb (51.3 kg) Height:  4\' 10"  (147.3 cm)  BEHAVIORAL SYMPTOMS/MOOD NEUROLOGICAL BOWEL NUTRITION STATUS      Continent    AMBULATORY STATUS COMMUNICATION OF NEEDS Skin   Extensive Assist Verbally Normal                       Personal Care Assistance Level of Assistance  Bathing, Feeding, Dressing Bathing Assistance: Maximum assistance Feeding assistance: Maximum assistance Dressing Assistance: Maximum assistance     Functional Limitations Info  Sight Sight Info: Impaired (  Glasses)        SPECIAL CARE FACTORS FREQUENCY                       Contractures      Additional Factors Info  Code Status, Allergies Code Status Info: DNR - Limited Allergies Info: NKA           Current Medications (05/03/2023):  This is the current hospital active medication list Current Facility-Administered  Medications  Medication Dose Route Frequency Provider Last Rate Last Admin   acetaminophen (TYLENOL) tablet 650 mg  650 mg Oral Q6H PRN Andris Baumann, MD   650 mg at 05/03/23 1210   Or   acetaminophen (TYLENOL) suppository 650 mg  650 mg Rectal Q6H PRN Andris Baumann, MD       ALPRAZolam Prudy Feeler) tablet 0.25 mg  0.25 mg Oral Daily PRN Andris Baumann, MD       amoxicillin-clavulanate (AUGMENTIN) 875-125 MG per tablet 1 tablet  1 tablet Oral Q12H Andris Baumann, MD   1 tablet at 05/03/23 0740   buprenorphine (BUTRANS) 10 MCG/HR 1 patch  1 patch Transdermal Weekly Andris Baumann, MD   1 patch at 05/03/23 1029   enoxaparin (LOVENOX) injection 40 mg  40 mg Subcutaneous Q24H Lindajo Royal V, MD   40 mg at 05/02/23 1017   gabapentin (NEURONTIN) capsule 600 mg  600 mg Oral TID Andris Baumann, MD   600 mg at 05/03/23 0942   HYDROmorphone (DILAUDID) tablet 1 mg  1 mg Oral Q4H PRN Andris Baumann, MD   1 mg at 05/03/23 1258   ipratropium-albuterol (DUONEB) 0.5-2.5 (3) MG/3ML nebulizer solution 3 mL  3 mL Nebulization Q4H PRN Andris Baumann, MD       iron polysaccharides (NIFEREX) capsule 150 mg  150 mg Oral Daily Marrion Coy, MD   150 mg at 05/03/23 0944   losartan (COZAAR) tablet 25 mg  25 mg Oral Daily Andris Baumann, MD   25 mg at 05/03/23 9528   melatonin tablet 2.5 mg  2.5 mg Oral QHS Lindajo Royal V, MD   2.5 mg at 05/02/23 2105   methocarbamol (ROBAXIN) injection 1,000 mg  1,000 mg Intravenous Q6H PRN Andris Baumann, MD       ondansetron [facility]) tablet 4 mg  4 mg Oral Q6H PRN Andris Baumann, MD       Or   ondansetron [facility]) injection 4 mg  4 mg Intravenous Q6H PRN Andris Baumann, MD       pantoprazole (PROTONIX) EC tablet 40 mg  40 mg Oral BID Andris Baumann, MD   40 mg at 05/02/23 2106   rOPINIRole (REQUIP) tablet 1.5 mg  1.5 mg Oral TID Andris Baumann, MD   1.5 mg at 05/03/23 1028   [START ON 05/04/2023] torsemide (DEMADEX) tablet 40 mg  40 mg Oral Daily Marrion Coy, MD        Current Outpatient Medications  Medication Sig Dispense Refill   acetaminophen (TYLENOL) 650 MG CR tablet Take 650 mg by mouth every 8 (eight) hours as needed for pain.     ALPRAZolam (XANAX) 1 MG tablet Take 1 tablet (1 mg total) by mouth 2 (two) times daily as needed. 30 tablet 0   amLODipine (NORVASC) 5 MG tablet Take 1 tablet (5 mg total) by mouth daily. 90 tablet 3   atorvastatin (LIPITOR) 10 MG tablet Take 1 tablet (10 mg total) by  mouth daily. 90 tablet 2   buprenorphine (BUTRANS) 10 MCG/HR PTWK Place 1 patch onto the skin once a week.     Cholecalciferol 25 MCG (1000 UT) TBDP Take 1,000 Units by mouth daily.     cyanocobalamin (VITAMIN B12) 1000 MCG tablet Take 1,000 mcg by mouth daily.     furosemide (LASIX) 20 MG tablet Take 1 tablet (20 mg total) by mouth daily as needed for fluid or edema. (Patient taking differently: Take 20 mg by mouth every other day.) 90 tablet 3   gabapentin (NEURONTIN) 300 MG capsule Take 1 capsule (300 mg total) by mouth 3 (three) times daily. (Patient taking differently: Take 600 mg by mouth 3 (three) times daily.) 270 capsule 3   ipratropium-albuterol (DUONEB) 0.5-2.5 (3) MG/3ML SOLN Take 3 mLs by nebulization every 6 (six) hours as needed. 360 mL 6   losartan (COZAAR) 25 MG tablet Take 25 mg by mouth daily.     Multiple Vitamins-Minerals (MULTIVITAMIN ADULT EXTRA C) CHEW      potassium chloride (KLOR-CON) 10 MEQ tablet Take 1 tablet (10 mEq total) by mouth daily as needed (With Furosemide). Take with lasix 90 tablet 3   rOPINIRole (REQUIP) 1 MG tablet Take 1 tablet (1 mg total) by mouth 3 (three) times daily. (Patient taking differently: Take 1.5 mg by mouth 3 (three) times daily.) 90 tablet 2   doxycycline (VIBRAMYCIN) 100 MG capsule Take 1 capsule (100 mg total) by mouth 2 (two) times daily. (Patient not taking: Reported on 05/02/2023) 20 capsule 0     Discharge Medications: Please see discharge summary for a list of discharge medications.  Relevant  Imaging Results:  Relevant Lab Results:   Additional Information SSN: [id number]  Marquita Palms, LCSW

## 2023-05-04 DIAGNOSIS — I5033 Acute on chronic diastolic (congestive) heart failure: Secondary | ICD-10-CM | POA: Diagnosis not present

## 2023-05-04 DIAGNOSIS — N179 Acute kidney failure, unspecified: Secondary | ICD-10-CM | POA: Diagnosis not present

## 2023-05-04 DIAGNOSIS — J9621 Acute and chronic respiratory failure with hypoxia: Secondary | ICD-10-CM | POA: Diagnosis not present

## 2023-05-04 LAB — BASIC METABOLIC PANEL
Anion gap: 9 (ref 5–15)
BUN: 29 mg/dL — ABNORMAL HIGH (ref 8–23)
CO2: 35 mmol/L — ABNORMAL HIGH (ref 22–32)
Calcium: 9.4 mg/dL (ref 8.9–10.3)
Chloride: 93 mmol/L — ABNORMAL LOW (ref 98–111)
Creatinine, Ser: 1.11 mg/dL — ABNORMAL HIGH (ref 0.44–1.00)
GFR, Estimated: 52 mL/min — ABNORMAL LOW (ref >60)
Glucose, Bld: 93 mg/dL (ref 70–99)
Potassium: 3.1 mmol/L — ABNORMAL LOW (ref 3.5–5.1)
Sodium: 137 mmol/L (ref 135–145)

## 2023-05-04 LAB — CBC
HCT: 27 % — ABNORMAL LOW (ref 36.0–46.0)
Hemoglobin: 8.1 g/dL — ABNORMAL LOW (ref 12.0–15.0)
MCH: 26.6 pg (ref 26.0–34.0)
MCHC: 30 g/dL (ref 30.0–36.0)
MCV: 88.8 fL (ref 80.0–100.0)
Platelets: 254 10*3/uL (ref 150–400)
RBC: 3.04 MIL/uL — ABNORMAL LOW (ref 3.87–5.11)
RDW: 15.8 % — ABNORMAL HIGH (ref 11.5–15.5)
WBC: 3.7 10*3/uL — ABNORMAL LOW (ref 4.0–10.5)
nRBC: 0 % (ref 0.0–0.2)

## 2023-05-04 LAB — MAGNESIUM: Magnesium: 2 mg/dL (ref 1.7–2.4)

## 2023-05-04 MED ORDER — POTASSIUM CHLORIDE CRYS ER 20 MEQ PO TBCR
40.0000 meq | EXTENDED_RELEASE_TABLET | ORAL | Status: AC
Start: 2023-05-04 — End: 2023-05-04
  Administered 2023-05-04 (×2): 40 meq via ORAL
  Filled 2023-05-04 (×2): qty 2

## 2023-05-04 MED ORDER — SPIRONOLACTONE 25 MG PO TABS
25.0000 mg | ORAL_TABLET | Freq: Every day | ORAL | Status: DC
  Administered 2023-05-04 – 2023-05-07 (×4): 25 mg via ORAL
  Filled 2023-05-04 (×5): qty 1

## 2023-05-04 MED ORDER — ALPRAZOLAM 0.5 MG PO TABS
1.0000 mg | ORAL_TABLET | Freq: Once | ORAL | Status: AC
  Administered 2023-05-04: 1 mg via ORAL
  Filled 2023-05-04: qty 4

## 2023-05-04 MED ORDER — ENOXAPARIN SODIUM 40 MG/0.4ML IJ SOSY
40.0000 mg | PREFILLED_SYRINGE | INTRAMUSCULAR | Status: DC
  Administered 2023-05-04 – 2023-05-07 (×4): 40 mg via SUBCUTANEOUS
  Filled 2023-05-04 (×4): qty 0.4

## 2023-05-04 NOTE — Progress Notes (Signed)
       CROSS COVER NOTE  NAME: [name] MRN: [id number] DOB : 1946/02/24    Concern as stated by nurse / staff   Secure chat received from [facility] RN regarding patient reuesting additional meds for her anxiety soe she can sleep     Pertinent findings on chart review: Chart/meds reviewed. Patient home dose xanax 1mg  and current active order is 0.25mg    Assessment and  Interventions   Assessment:  Plan: Additional 1 mg xanax ordered for now       Donnie Mesa NP [facility] Cross Cover 7pm-7am - check amion for availability Pager [phone]

## 2023-05-04 NOTE — Progress Notes (Signed)
Progress Note   Patient: [name] JYN:[id number] DOB: February 26, 1946 DOA: 05/01/2023     2 DOS: the patient was seen and examined on 05/04/2023   Brief hospital course: Holly S Salone is a 77 y.o. female with medical history significant for peripheral arterial disease, COPD on chronic 3-4 L home oxygen, hypertension, HFpEF(EF 45-50% 10/2022)) , remote breast cancer, colon cancer s/p resection, chronic back pain on buprenorphine patch and gastric ulcer hospitalized from 11/17 to 05/01/2023 at [facility] for perforated gastric ulcer with melena, requiring emergent surgery discharged the same day to SNF who presents to ED with shortness of breath.  Upon arriving to hospital, heart rate was 120, tachypnea 28, was placed on 6 L oxygen for significant hypoxemia.  Troponin was 24, BNP 340.  CT angiogram of the chest to rule out a PE, showed bilateral pleural effusion.  Patient was started on IV Lasix for CHF exacerbation. 12/12.  Volume status much better, changed to oral diuretics.   Principal Problem:   Acute on chronic heart failure with preserved ejection fraction (HFpEF) (HCC) Active Problems:   AKI (acute kidney injury) (HCC)   Acute on chronic respiratory failure with hypoxia (HCC)   COPD (chronic obstructive pulmonary disease) (HCC)   S/P exploratory laparotomy 04/08/23 for perforated gastric ulcer   HTN (hypertension)   PAD (peripheral artery disease) (HCC)   Hypokalemia   Anxiety and depression   Chronic back pain   Iron deficiency anemia   Chronic anemia   Pressure injury of skin   Assessment and Plan: * Acute on chronic heart failure with preserved ejection fraction (HFpEF) (HCC) Bilateral pleural effusion Acute on chronic respiratory failure with hypoxia Patient requiring 6 L to maintain sats mid 90s up from baseline at 3.5L. Increased work of breathing tachypneic to 28 and tachycardic, BNP 340 and CTA showing bilateral pleural effusions(negative for  PE) Echocardiogram 1 year ago showed ejection fraction 50 to 55%, repeat echocardiogram pending. Patient was treated with IV Lasix, volume status much better today.  Renal function getting worse.  IV Lasix discontinued. Oxygenation is better, oxygen weaned down to 3.5 L. Condition continued to improve.  Continue oral torsemide and Aldactone.   Acute kidney injury secondary to diuretics. Hypokalemia. Replete potassium, also added spironolactone.   COPD (chronic obstructive pulmonary disease) (HCC) No exacerbation.   S/P exploratory laparotomy 04/08/23 for perforated gastric ulcer No acute issues suspected Hemoglobin stable Continue Protonix twice daily Continue Augmentin 875/125 twice daily, per [facility] note, she will be on Augmentin for a month.   HTN (hypertension) Continue home amlodipine 5 mg daily, losartan 25 daily   PAD (peripheral artery disease) (HCC) left SFA CTO with failed attempt in 01/2019 for revascularization & right calcified iliac artery stenosis  Continue atorvastatin Followed at [facility]   Chronic iron deficiency anemia History of melena 11/18 secondary to perforated gastric ulcer Patient has mild iron deficiency, B12 normal.  Continue oral iron therapy.   Chronic back pain Continue home medicines.   Anxiety and depression Restless leg syndrome/insomnia Xanax 0.25 daily as needed anxiety, ropinirole 1 mg 3 times daily, melatonin 3 mg nightly    Pressure ulcers; POA Pressure Injury 05/02/23 Sacrum Stage 1 -  Intact skin with non-blanchable redness of a localized area usually over a bony prominence. (Active)  05/02/23 2039  Location: Sacrum  Location Orientation:   Staging: Stage 1 -  Intact skin with non-blanchable redness of a localized area usually over a bony prominence.  Wound Description (Comments):  Present on Admission:          Subjective:  Patient doing well today, no significant short of breath.  Physical Exam: Vitals:   05/04/23 0004  05/04/23 0412 05/04/23 0500 05/04/23 0813  BP: 130/60 (!) 110/48  (!) 170/71  Pulse: 70 64  62  Resp: 19 19  18   Temp: 97.9 F (36.6 C) 97.8 F (36.6 C)  97.6 F (36.4 C)  TempSrc:      SpO2: 94% 99%  95%  Weight:   51 kg   Height:       General exam: Appears calm and comfortable  Respiratory system: Clear to auscultation. Respiratory effort normal. Cardiovascular system: S1 & S2 heard, RRR. No JVD, murmurs, rubs, gallops or clicks. No pedal edema. Gastrointestinal system: Abdomen is nondistended, soft and nontender. No organomegaly or masses felt. Normal bowel sounds heard. Central nervous system: Alert and oriented. No focal neurological deficits. Extremities: Symmetric 5 x 5 power. Skin: No rashes, lesions or ulcers Psychiatry: Judgement and insight appear normal. Mood & affect appropriate.    Data Reviewed:  Lab results reviewed.  Family Communication: None  Disposition: Status is: Inpatient Remains inpatient appropriate because: Severity of disease.  Unsafe discharge.     Time spent: 35 minutes  Author: Marrion Coy, MD 05/04/2023 12:54 PM  For on call review [email or web address].

## 2023-05-04 NOTE — Progress Notes (Signed)
Patient asking for something else to help with her nerves and help her sleep. She's has received xanax, robaxin and dilaudid 1mg  po already and melatonin and gabapentin 600 . Informed Steward Drone, NP , who states I CANNOT ORDER ANYTHING ADDITIONAL AT THIS TIME . Patient informed.

## 2023-05-04 NOTE — Care Management Important Message (Signed)
Important Message  Patient Details  Name: [name] MRN: [id number] Date of Birth: 25-May-1945   Important Message Given:  N/A - LOS <3 / Initial given by admissions     Olegario Messier [name] 05/04/2023, 8:31 AM

## 2023-05-04 NOTE — Plan of Care (Signed)

## 2023-05-04 NOTE — Plan of Care (Signed)

## 2023-05-04 NOTE — Progress Notes (Signed)
PHARMACIST - PHYSICIAN COMMUNICATION  CONCERNING:  Enoxaparin (Lovenox) for DVT Prophylaxis   ASSESSMENT: Patient was prescribed enoxaparin 30 mg subcutaneously every 24 hours for VTE prophylaxis.   Body mass index is 23.5 kg/m.  Estimated Creatinine Clearance: 30.6 mL/min (A) (by C-G formula based on SCr of 1.11 mg/dL (H)).  Based on [facility] policy, patient qualifies for enoxaparin dosing of 40 mg every 24 hours because their creatinine clearance is >30 mL/min.  PLAN: Pharmacy has adjusted enoxaparin dose per [facility] policy.  Description: Patient is now receiving enoxaparin 30 mg subcutaneously every 24 hours.  Cleo Santucci Rodriguez-Guzman PharmD, BCPS 05/04/2023 10:14 AM

## 2023-05-05 DIAGNOSIS — J449 Chronic obstructive pulmonary disease, unspecified: Secondary | ICD-10-CM | POA: Diagnosis not present

## 2023-05-05 DIAGNOSIS — I5033 Acute on chronic diastolic (congestive) heart failure: Secondary | ICD-10-CM | POA: Diagnosis not present

## 2023-05-05 DIAGNOSIS — J9621 Acute and chronic respiratory failure with hypoxia: Secondary | ICD-10-CM | POA: Diagnosis not present

## 2023-05-05 LAB — CBC
HCT: 28.2 % — ABNORMAL LOW (ref 36.0–46.0)
Hemoglobin: 8.4 g/dL — ABNORMAL LOW (ref 12.0–15.0)
MCH: 26.3 pg (ref 26.0–34.0)
MCHC: 29.8 g/dL — ABNORMAL LOW (ref 30.0–36.0)
MCV: 88.1 fL (ref 80.0–100.0)
Platelets: 234 10*3/uL (ref 150–400)
RBC: 3.2 MIL/uL — ABNORMAL LOW (ref 3.87–5.11)
RDW: 15.9 % — ABNORMAL HIGH (ref 11.5–15.5)
WBC: 3.8 10*3/uL — ABNORMAL LOW (ref 4.0–10.5)
nRBC: 0 % (ref 0.0–0.2)

## 2023-05-05 LAB — BASIC METABOLIC PANEL
Anion gap: 9 (ref 5–15)
BUN: 36 mg/dL — ABNORMAL HIGH (ref 8–23)
CO2: 32 mmol/L (ref 22–32)
Calcium: 9.3 mg/dL (ref 8.9–10.3)
Chloride: 95 mmol/L — ABNORMAL LOW (ref 98–111)
Creatinine, Ser: 1.14 mg/dL — ABNORMAL HIGH (ref 0.44–1.00)
GFR, Estimated: 50 mL/min — ABNORMAL LOW (ref >60)
Glucose, Bld: 81 mg/dL (ref 70–99)
Potassium: 4.4 mmol/L (ref 3.5–5.1)
Sodium: 136 mmol/L (ref 135–145)

## 2023-05-05 LAB — MAGNESIUM: Magnesium: 2.3 mg/dL (ref 1.7–2.4)

## 2023-05-05 MED ORDER — ALPRAZOLAM 0.25 MG PO TABS
0.2500 mg | ORAL_TABLET | Freq: Every day | ORAL | Status: DC | PRN
  Administered 2023-05-07 – 2023-05-15 (×7): 0.25 mg via ORAL
  Filled 2023-05-05 (×9): qty 1

## 2023-05-05 MED ORDER — METHOCARBAMOL 500 MG PO TABS
1000.0000 mg | ORAL_TABLET | Freq: Four times a day (QID) | ORAL | Status: DC | PRN
  Administered 2023-05-05 – 2023-05-14 (×15): 1000 mg via ORAL
  Filled 2023-05-05 (×17): qty 2

## 2023-05-05 MED ORDER — ALPRAZOLAM 0.25 MG PO TABS
0.1250 mg | ORAL_TABLET | Freq: Once | ORAL | Status: AC
  Administered 2023-05-05: 0.125 mg via ORAL
  Filled 2023-05-05: qty 1

## 2023-05-05 MED ORDER — IPRATROPIUM-ALBUTEROL 0.5-2.5 (3) MG/3ML IN SOLN
3.0000 mL | RESPIRATORY_TRACT | Status: DC | PRN
  Administered 2023-05-09 – 2023-05-10 (×2): 3 mL via RESPIRATORY_TRACT
  Filled 2023-05-05 (×2): qty 3

## 2023-05-05 MED ORDER — ALPRAZOLAM 0.25 MG PO TABS
0.1250 mg | ORAL_TABLET | Freq: Every day | ORAL | Status: AC
  Administered 2023-05-05: 0.125 mg via ORAL
  Filled 2023-05-05: qty 1

## 2023-05-05 NOTE — Plan of Care (Signed)

## 2023-05-05 NOTE — Progress Notes (Signed)
Progress Note   Patient: [name] ZOX:[id number] DOB: [birth year] DOA: 05/01/2023     3 DOS: the patient was seen and examined on 05/05/2023   Brief hospital course: Holly S Rundle is a 77 y.o. female with medical history significant for peripheral arterial disease, COPD on chronic 3-4 L home oxygen, hypertension, HFpEF(EF 45-50% 10/2022)) , remote breast cancer, colon cancer s/p resection, chronic back pain on buprenorphine patch and gastric ulcer hospitalized from 11/17 to 05/01/2023 at [facility] for perforated gastric ulcer with melena, requiring emergent surgery discharged the same day to SNF who presents to ED with shortness of breath.  Upon arriving to hospital, heart rate was 120, tachypnea 28, was placed on 6 L oxygen for significant hypoxemia.  Troponin was 24, BNP 340.  CT angiogram of the chest to rule out a PE, showed bilateral pleural effusion.  Patient was started on IV Lasix for CHF exacerbation. 12/12.  Volume status much better, changed to oral diuretics.   Principal Problem:   Acute on chronic heart failure with preserved ejection fraction (HFpEF) (HCC) Active Problems:   AKI (acute kidney injury) (HCC)   Acute on chronic respiratory failure with hypoxia (HCC)   COPD (chronic obstructive pulmonary disease) (HCC)   S/P exploratory laparotomy 04/08/23 for perforated gastric ulcer   HTN (hypertension)   PAD (peripheral artery disease) (HCC)   Hypokalemia   Anxiety and depression   Chronic back pain   Iron deficiency anemia   Chronic anemia   Pressure injury of skin   Assessment and Plan: * Acute on chronic heart failure with preserved ejection fraction (HFpEF) (HCC) Bilateral pleural effusion Acute on chronic respiratory failure with hypoxia Patient requiring 6 L to maintain sats mid 90s up from baseline at 3.5L. Increased work of breathing tachypneic to 28 and tachycardic, BNP 340 and CTA showing bilateral pleural effusions(negative for  PE) Echocardiogram 1 year ago showed ejection fraction 50 to 55%, repeat echocardiogram showed ejection ejection fraction 60 to 65% with grade 1 diastolic dysfunction. Patient was treated with IV Lasix, volume status much better today.  Renal function getting worse.  IV Lasix discontinued. Oxygenation is better, oxygen weaned down to 3.5 L. Condition continued to improve.  Continue oral torsemide and Aldactone. Condition has been stabilized, currently pending nursing home placement.   Acute kidney injury secondary to diuretics. Hypokalemia. Repleted potassium, also added spironolactone. Potassium has normalized.   COPD (chronic obstructive pulmonary disease) (HCC) No exacerbation.   S/P exploratory laparotomy 04/08/23 for perforated gastric ulcer No acute issues suspected Hemoglobin stable Continue Protonix twice daily Continue Augmentin 875/125 twice daily, per [facility] note, she will be on Augmentin for a month.   HTN (hypertension) Continue home amlodipine 5 mg daily, losartan 25 daily   PAD (peripheral artery disease) (HCC) left SFA CTO with failed attempt in 01/2019 for revascularization & right calcified iliac artery stenosis  Continue atorvastatin Followed at [facility]   Chronic iron deficiency anemia History of melena 11/18 secondary to perforated gastric ulcer Patient has mild iron deficiency, B12 normal.  Continue oral iron therapy.   Chronic back pain Continue home medicines.   Anxiety and depression Restless leg syndrome/insomnia Xanax 0.25 daily as needed anxiety, ropinirole 1 mg 3 times daily, melatonin 3 mg nightly    Pressure ulcers; POA Pressure Injury 05/02/23 Sacrum Stage 1 -  Intact skin with non-blanchable redness of a localized area usually over a bony prominence. (Active)  05/02/23 2039  Location: Sacrum  Location Orientation:   Staging: Stage  1 -  Intact skin with non-blanchable redness of a localized area usually over a bony prominence.  Wound Description  (Comments):   Present on Admission:          Subjective:  Patient still complaining short of breath with exertion, appeared at baseline.  No cough.  Physical Exam: Vitals:   05/04/23 2328 05/05/23 0500 05/05/23 0534 05/05/23 0841  BP: (!) 124/54  138/62 (!) 183/77  Pulse: (!) 59  61 69  Resp: 19  19 16   Temp: (!) 97.4 F (36.3 C)  (!) 97.3 F (36.3 C) 97.7 F (36.5 C)  TempSrc: Oral     SpO2: 98%  100% 100%  Weight:  54.1 kg    Height:       General exam: Appears calm and comfortable  Respiratory system: Clear to auscultation. Respiratory effort normal. Cardiovascular system: S1 & S2 heard, RRR. No JVD, murmurs, rubs, gallops or clicks. No pedal edema. Gastrointestinal system: Abdomen is nondistended, soft and nontender. No organomegaly or masses felt. Normal bowel sounds heard. Central nervous system: Alert and oriented x3. No focal neurological deficits. Extremities: Symmetric 5 x 5 power. Skin: No rashes, lesions or ulcers Psychiatry: Judgement and insight appear normal. Mood & affect appropriate.    Data Reviewed:  Lab results reviewed.  Family Communication: None  Disposition: Status is: Inpatient Remains inpatient appropriate because: Severity of disease, unsafe discharge.     Time spent: 35 minutes  Author: Marrion Coy, MD 05/05/2023 10:45 AM  For on call review [email or web address].

## 2023-05-06 DIAGNOSIS — J9621 Acute and chronic respiratory failure with hypoxia: Secondary | ICD-10-CM | POA: Diagnosis not present

## 2023-05-06 DIAGNOSIS — I5033 Acute on chronic diastolic (congestive) heart failure: Secondary | ICD-10-CM | POA: Diagnosis not present

## 2023-05-06 DIAGNOSIS — N179 Acute kidney failure, unspecified: Secondary | ICD-10-CM | POA: Diagnosis not present

## 2023-05-06 LAB — BASIC METABOLIC PANEL
Anion gap: 9 (ref 5–15)
BUN: 33 mg/dL — ABNORMAL HIGH (ref 8–23)
CO2: 36 mmol/L — ABNORMAL HIGH (ref 22–32)
Calcium: 10.3 mg/dL (ref 8.9–10.3)
Chloride: 92 mmol/L — ABNORMAL LOW (ref 98–111)
Creatinine, Ser: 1.18 mg/dL — ABNORMAL HIGH (ref 0.44–1.00)
GFR, Estimated: 48 mL/min — ABNORMAL LOW (ref >60)
Glucose, Bld: 100 mg/dL — ABNORMAL HIGH (ref 70–99)
Potassium: 4.3 mmol/L (ref 3.5–5.1)
Sodium: 137 mmol/L (ref 135–145)

## 2023-05-06 MED ORDER — HYDROXYZINE HCL 50 MG PO TABS
25.0000 mg | ORAL_TABLET | Freq: Three times a day (TID) | ORAL | Status: DC | PRN
  Administered 2023-05-06 – 2023-05-08 (×4): 25 mg via ORAL
  Filled 2023-05-06 (×4): qty 1

## 2023-05-06 MED ORDER — TORSEMIDE 20 MG PO TABS
20.0000 mg | ORAL_TABLET | Freq: Every day | ORAL | Status: DC
  Administered 2023-05-07: 20 mg via ORAL
  Filled 2023-05-06 (×2): qty 1

## 2023-05-06 MED ORDER — QUETIAPINE FUMARATE 25 MG PO TABS
25.0000 mg | ORAL_TABLET | Freq: Every day | ORAL | Status: DC
  Administered 2023-05-06 – 2023-05-14 (×9): 25 mg via ORAL
  Filled 2023-05-06 (×9): qty 1

## 2023-05-06 NOTE — Plan of Care (Signed)

## 2023-05-06 NOTE — Progress Notes (Addendum)
Progress Note   Patient: [name] [id number] DOB: 1946-03-19 DOA: 05/01/2023     4 DOS: the patient was seen and examined on 05/06/2023   Brief hospital course: [name] is a 77 y.o. female with medical history significant for peripheral arterial disease, COPD on chronic 3-4 L home oxygen, hypertension, HFpEF(EF 45-50% 10/2022)) , remote breast cancer, colon cancer s/p resection, chronic back pain on buprenorphine patch and gastric ulcer hospitalized from 11/17 to 05/01/2023 at [facility] for perforated gastric ulcer with melena, requiring emergent surgery discharged the same day to SNF who presents to ED with shortness of breath.  Upon arriving to hospital, heart rate was 120, tachypnea 28, was placed on 6 L oxygen for significant hypoxemia.  Troponin was 24, BNP 340.  CT angiogram of the chest to rule out a PE, showed bilateral pleural effusion.  Patient was started on IV Lasix for CHF exacerbation. 12/12.  Volume status much better, changed to oral diuretics.   Principal Problem:   Acute on chronic heart failure with preserved ejection fraction (HFpEF) (HCC) Active Problems:   AKI (acute kidney injury) (HCC)   Acute on chronic respiratory failure with hypoxia (HCC)   COPD (chronic obstructive pulmonary disease) (HCC)   S/P exploratory laparotomy 04/08/23 for perforated gastric ulcer   HTN (hypertension)   PAD (peripheral artery disease) (HCC)   Hypokalemia   Anxiety and depression   Chronic back pain   Iron deficiency anemia   Chronic anemia   Pressure injury of skin   Assessment and Plan: * Acute on chronic heart failure with preserved ejection fraction (HFpEF) (HCC) Bilateral pleural effusion Acute on chronic respiratory failure with hypoxia Patient requiring 6 L to maintain sats mid 90s up from baseline at 3.5L. Increased work of breathing tachypneic to 28 and tachycardic, BNP 340 and CTA showing bilateral pleural effusions(negative for  PE) Echocardiogram 1 year ago showed ejection fraction 50 to 55%, repeat echocardiogram showed ejection ejection fraction 60 to 65% with grade 1 diastolic dysfunction. Patient was treated with IV Lasix, volume status much better today.  Renal function getting worse.  IV Lasix discontinued. Oxygenation is better, oxygen weaned down to 3.5 L. Condition continued to improve.  Continue oral torsemide and Aldactone. Condition continued to be stable, patient request to reduce the dose of diuretics due to frequent urination.  Decrease torsemide to 20 mg daily.   Acute kidney injury secondary to diuretics. Hypokalemia. Repleted potassium, also added spironolactone. Potassium is still normal, renal function stable.   COPD (chronic obstructive pulmonary disease) (HCC) No exacerbation.   S/P exploratory laparotomy 04/08/23 for perforated gastric ulcer No acute issues suspected Hemoglobin stable Continue Protonix twice daily Continue Augmentin 875/125 twice daily, per [facility] note, she will be on Augmentin for a month.   HTN (hypertension) Continue home amlodipine 5 mg daily, losartan 25 daily   PAD (peripheral artery disease) (HCC) left SFA CTO with failed attempt in 01/2019 for revascularization & right calcified iliac artery stenosis  Continue atorvastatin Followed at [facility]   Chronic iron deficiency anemia History of melena 11/18 secondary to perforated gastric ulcer Patient has mild iron deficiency, B12 normal.  Continue oral iron therapy.   Chronic back pain Continue home medicines.   Anxiety and depression Restless leg syndrome/insomnia Xanax 0.25 daily as needed anxiety, ropinirole 1 mg 3 times daily, melatonin 3 mg nightly Still cannot sleep at night, started Seroquel 25 mg every evening. Also added hydroxyzine as patient still has significant anxiety, could not increase  dose of Xanax.    Pressure ulcers; POA Pressure Injury 05/02/23 Sacrum Stage 1 -  Intact skin with non-blanchable  redness of a localized area usually over a bony prominence. (Active)  05/02/23 2039  Location: Sacrum  Location Orientation:   Staging: Stage 1 -  Intact skin with non-blanchable redness of a localized area usually over a bony prominence.  Wound Description (Comments):   Present on Admission:         Subjective:  Still sleep poorly at night, significant anxiety.  Short of breath had improved.  Physical Exam: Vitals:   05/05/23 1948 05/06/23 0020 05/06/23 0355 05/06/23 0832  BP: (!) 113/58 (!) 113/49 139/80 (!) 155/72  Pulse: 66 64 69 60  Resp: 20 20 20 16   Temp: 97.8 F (36.6 C) 98 F (36.7 C) 98.2 F (36.8 C) 97.9 F (36.6 C)  TempSrc:   Oral   SpO2: 99% 98% 100% 94%  Weight:      Height:       General exam: Appears calm and comfortable  Respiratory system: Significant decreased breathing sounds. Respiratory effort normal. Cardiovascular system: S1 & S2 heard, RRR. No JVD, murmurs, rubs, gallops or clicks. No pedal edema. Gastrointestinal system: Abdomen is nondistended, soft and nontender. No organomegaly or masses felt. Normal bowel sounds heard. Central nervous system: Alert and oriented x2. No focal neurological deficits. Extremities: Symmetric 5 x 5 power. Skin: No rashes, lesions or ulcers Psychiatry:  Mood & affect appropriate.    Data Reviewed:  Lab results reviewed.  Family Communication: None  Disposition: Status is: Inpatient Remains inpatient appropriate because: Unsafe discharge, pending nursing home placement.     Time spent: 35 minutes  Author: Marrion Coy, MD 05/06/2023 11:17 AM  For on call review [email or web address].

## 2023-05-06 NOTE — Plan of Care (Signed)
  Problem: Health Behavior/Discharge Planning: Goal: Ability to manage health-related needs will improve Outcome: Progressing   Problem: Clinical Measurements: Goal: Ability to maintain clinical measurements within normal limits will improve Outcome: Progressing   Problem: Activity: Goal: Risk for activity intolerance will decrease Outcome: Progressing   Problem: Coping: Goal: Level of anxiety will decrease Outcome: Progressing   

## 2023-05-07 DIAGNOSIS — J9621 Acute and chronic respiratory failure with hypoxia: Secondary | ICD-10-CM | POA: Diagnosis not present

## 2023-05-07 DIAGNOSIS — J449 Chronic obstructive pulmonary disease, unspecified: Secondary | ICD-10-CM | POA: Diagnosis not present

## 2023-05-07 DIAGNOSIS — I5033 Acute on chronic diastolic (congestive) heart failure: Secondary | ICD-10-CM | POA: Diagnosis not present

## 2023-05-07 LAB — BASIC METABOLIC PANEL
Anion gap: 8 (ref 5–15)
BUN: 39 mg/dL — ABNORMAL HIGH (ref 8–23)
CO2: 39 mmol/L — ABNORMAL HIGH (ref 22–32)
Calcium: 10.1 mg/dL (ref 8.9–10.3)
Chloride: 96 mmol/L — ABNORMAL LOW (ref 98–111)
Creatinine, Ser: 1.17 mg/dL — ABNORMAL HIGH (ref 0.44–1.00)
GFR, Estimated: 48 mL/min — ABNORMAL LOW (ref >60)
Glucose, Bld: 126 mg/dL — ABNORMAL HIGH (ref 70–99)
Potassium: 4.4 mmol/L (ref 3.5–5.1)
Sodium: 143 mmol/L (ref 135–145)

## 2023-05-07 NOTE — Plan of Care (Signed)

## 2023-05-07 NOTE — Plan of Care (Signed)

## 2023-05-07 NOTE — TOC Progression Note (Addendum)
Transition of Care [facility]) - Progression Note    Patient Details  Name: [name] MRN: [id number] Date of Birth: 10-11-1945  [facility]) CM/SW Contact  Margarito Liner, LCSW Phone Number: 05/07/2023, 2:27 PM  Clinical Narrative:  CSW gave bed offers. [facility] and [facility] declined but she would like for them to review the referral again. CSW notified the admissions coordinators. CSW will follow up with patient once they make final determination.  3:49 pm: [facility] has offered a bed and patient accepted. CSW started insurance authorization. CSW asked [facility] admissions coordinator to have their social worker help with look into ALF's for after rehab.  Expected Discharge Plan and Services                                               Social Determinants of Health (SDOH) Interventions SDOH Screenings   Food Insecurity: Unknown (05/04/2023)  Housing: Low Risk  (05/04/2023)  Transportation Needs: No Transportation Needs (05/04/2023)  Utilities: Not At Risk (05/04/2023)  Financial Resource Strain: Low Risk  (04/17/2023)   Received from [facility]  Tobacco Use: High Risk (04/24/2023)   Received from [facility]    Readmission Risk Interventions    05/02/2023   12:22 PM 09/22/2021    1:38 PM  Readmission Risk Prevention Plan  Transportation Screening Complete Complete  PCP or Specialist Appt within 3-5 Days Complete   HRI or Home Care Consult Complete   Social Work Consult for Recovery Care Planning/Counseling Complete   Palliative Care Screening Not Applicable   Medication Review Oceanographer) Complete Complete  HRI or Home Care Consult  Complete  SW Recovery Care/Counseling Consult  Complete  Palliative Care Screening  Not Applicable  Skilled Nursing Facility  Not Applicable

## 2023-05-07 NOTE — Progress Notes (Signed)
Physical Therapy Treatment Patient Details Name: [name] MRN: [id number] DOB: Jan 18, 1946 Today's Date: 05/07/2023   History of Present Illness 77 y.o. female with medical history significant for peripheral arterial disease, COPD on chronic 3-4 L home oxygen, hypertension, HFpEF(EF 45-50% 10/2022)) , remote breast cancer, colon cancer s/p resection, chronic back pain on buprenorphine patch and gastric ulcer hospitalized from 11/17 to 05/01/2023 at [facility] for perforated gastric ulcer with melena, requiring emergent surgery discharged the same day to SNF who presents to ED with shortness of breath. Patient was started on IV Lasix for CHF exacerbation.    PT Comments  Pt alert, seated in recliner. Did endorse 9/10 low back pain and R hip pain with mobility, premedicated prior to session but RN notified of pt's continued pain levels.The patient was able to perform mobility with CGA-supervision with RW but very challenged with pain and efficiency. She ambulated ~41ft with many standing rest breaks (spO2 >90% on 3L throughout) and exhibited decreased velocity, shuffled step and need to adjust RW positioning with turns/steps (tended to be outside BOS). Returned to supine with needs in reach at end of session. The patient would benefit from further skilled PT intervention to continue to progress towards goals.    If plan is discharge home, recommend the following: Assistance with cooking/housework;Help with stairs or ramp for entrance;Assist for transportation   Can travel by private vehicle     No  Equipment Recommendations  Other (comment) (TBD at next venue of care)    Recommendations for Other Services       Precautions / Restrictions Precautions Precautions: Fall Precaution Comments: recent ex lap Restrictions Weight Bearing Restrictions Per Provider Order: No     Mobility  Bed Mobility Overal bed mobility: Needs Assistance Bed Mobility: Sit to Supine     Supine to  sit: HOB elevated, Supervision     General bed mobility comments: self-selecting slow sequencing requiring extra time, difficulty to perform due to pain, and no real cuing needed    Transfers Overall transfer level: Needs assistance Equipment used: Rolling walker (2 wheels) Transfers: Sit to/from Stand Sit to Stand: Supervision           General transfer comment: difficulty due to elevated pain but able to perform without assistance    Ambulation/Gait Ambulation/Gait assistance: Contact guard assist, Supervision Gait Distance (Feet): 90 Feet Assistive device: Rolling walker (2 wheels)   Gait velocity: decreased     General Gait Details: very slow, decreased velocity, several standing rest breaks. pt safe and unrushed but challenged by pain and fatigue   Stairs             Wheelchair Mobility     Tilt Bed    Modified Rankin (Stroke Patients Only)       Balance Overall balance assessment: Needs assistance Sitting-balance support: Bilateral upper extremity supported, Feet unsupported Sitting balance-Leahy Scale: Fair Sitting balance - Comments: pericare in sitting modI   Standing balance support: Bilateral upper extremity supported, During functional activity Standing balance-Leahy Scale: Fair Standing balance comment: able to doff/don mesh underwear with extra time, reliant on SUE                            Cognition Arousal: Alert Behavior During Therapy: [facility] for tasks assessed/performed Overall Cognitive Status: Within Functional Limits for tasks assessed  Exercises      General Comments        Pertinent Vitals/Pain Pain Assessment Pain Assessment: 0-10 Pain Score: 9  Pain Location: low back and R hip Pain Descriptors / Indicators: Aching, Sore Pain Intervention(s): Limited activity within patient's tolerance, Monitored during session, Repositioned, Patient requesting  pain meds-RN notified, Premedicated before session    Home Living                          Prior Function            PT Goals (current goals can now be found in the care plan section) Progress towards PT goals: Progressing toward goals    Frequency    Min 1X/week      PT Plan      Co-evaluation              AM-PAC PT "6 Clicks" Mobility   Outcome Measure  Help needed turning from your back to your side while in a flat bed without using bedrails?: A Little Help needed moving from lying on your back to sitting on the side of a flat bed without using bedrails?: A Little Help needed moving to and from a bed to a chair (including a wheelchair)?: A Little Help needed standing up from a chair using your arms (e.g., wheelchair or bedside chair)?: None Help needed to walk in hospital room?: None Help needed climbing 3-5 steps with a railing? : A Little 6 Click Score: 20    End of Session Equipment Utilized During Treatment: Oxygen (3L) Activity Tolerance: Patient tolerated treatment well Patient left: in bed;with call bell/phone within reach;with bed alarm set Nurse Communication: Mobility status PT Visit Diagnosis: Muscle weakness (generalized) (M62.81);Unsteadiness on feet (R26.81)     Time: 1610-9604 PT Time Calculation (min) (ACUTE ONLY): 34 min  Charges:    $Therapeutic Activity: 23-37 mins PT General Charges $$ ACUTE PT VISIT: 1 Visit                    Olga Coaster PT, DPT 11:48 AM,05/07/23

## 2023-05-07 NOTE — Progress Notes (Signed)
Occupational Therapy Treatment Patient Details Name: [name] MRN: [id number] DOB: 1945-07-22 Today's Date: 05/07/2023   History of present illness 77 y.o. female with medical history significant for peripheral arterial disease, COPD on chronic 3-4 L home oxygen, hypertension, HFpEF(EF 45-50% 10/2022)) , remote breast cancer, colon cancer s/p resection, chronic back pain on buprenorphine patch and gastric ulcer hospitalized from 11/17 to 05/01/2023 at [facility] [place] for perforated gastric ulcer with melena, requiring emergent surgery discharged the same day to SNF who presents to ED with shortness of breath. Patient was started on IV Lasix for CHF exacerbation.   OT comments  Pt seen for OT tx. OT just coming into the room when pt had pressed the call button, noting she needed to urgently use the [facility]. OT placed BSC next to the recliner. Pt declined use of RW despite encouragement and instead used BUE support on table and arm rest of BSC to stand and pivot to the [facility]. Pt educated in benefits of RW for safety and pt verbalized understanding. Pt able to complete clothing mgt and pericare with CGA in standing. Pt then trialed standard RW (she had a bariatric RW present in the room which was not sized properly). Pt ambulated in room with RW and CGA demonstrating fair balance. Pt returned to recliner. Pt progressing well towards goals. Continues to benefit from skilled OT services.       If plan is discharge home, recommend the following:  A little help with walking and/or transfers;A lot of help with bathing/dressing/bathroom;Assistance with cooking/housework;Assist for transportation;Help with stairs or ramp for entrance   Equipment Recommendations  BSC/3in1;Other (comment) (2WW)    Recommendations for Other Services      Precautions / Restrictions Precautions Precautions: Fall Precaution Comments: recent ex lap Restrictions Weight Bearing Restrictions Per Provider Order: No        Mobility Bed Mobility               General bed mobility comments: NT, in recliner    Transfers Overall transfer level: Needs assistance Equipment used: None, Rolling walker (2 wheels) Transfers: Sit to/from Stand, Bed to chair/wheelchair/BSC Sit to Stand: Supervision     Step pivot transfers: Contact guard assist     General transfer comment: pt declined RW for transfer from recliner to [facility], used RW for next transfer     Balance Overall balance assessment: Needs assistance Sitting-balance support: Single extremity supported, No upper extremity supported, Feet supported Sitting balance-Leahy Scale: Fair     Standing balance support: Bilateral upper extremity supported, During functional activity Standing balance-Leahy Scale: Fair                             ADL either performed or assessed with clinical judgement   ADL Overall ADL's : Needs assistance/impaired     Grooming: Sitting;Set up                   Toilet Transfer: Contact guard assist;BSC/3in1 Toilet Transfer Details (indicate cue type and reason): pt refused RW due to urgency of need, CGA provided for safety and pt utilized table and arm rail of BSC for UE support, moved cautiously Toileting- Clothing Manipulation and Hygiene: Contact guard assist;Sit to/from stand       Functional mobility during ADLs: Contact guard assist;Rolling walker (2 wheels)      Extremity/Trunk Assessment              Vision  Perception     Praxis      Cognition Arousal: Alert Behavior During Therapy: WFL for tasks assessed/performed Overall Cognitive Status: Within Functional Limits for tasks assessed                                          Exercises      Shoulder Instructions       General Comments      Pertinent Vitals/ Pain       Pain Assessment Pain Assessment: Faces Faces Pain Scale: Hurts a little bit Pain Location: back Pain Descriptors /  Indicators: Aching Pain Intervention(s): Monitored during session, Repositioned  Home Living                                          Prior Functioning/Environment              Frequency  Min 1X/week        Progress Toward Goals  OT Goals(current goals can now be found in the care plan section)  Progress towards OT goals: Progressing toward goals  Acute Rehab OT Goals Patient Stated Goal: have less pain OT Goal Formulation: With patient Time For Goal Achievement: 05/17/23 Potential to Achieve Goals: Good  Plan      Co-evaluation                 AM-PAC OT "6 Clicks" Daily Activity     Outcome Measure   Help from another person eating meals?: None Help from another person taking care of personal grooming?: A Little Help from another person toileting, which includes using toliet, bedpan, or urinal?: A Little Help from another person bathing (including washing, rinsing, drying)?: A Little Help from another person to put on and taking off regular upper body clothing?: A Little Help from another person to put on and taking off regular lower body clothing?: A Little 6 Click Score: 19    End of Session Equipment Utilized During Treatment: Oxygen;Rolling walker (2 wheels)  OT Visit Diagnosis: Other abnormalities of gait and mobility (R26.89);Muscle weakness (generalized) (M62.81);Pain   Activity Tolerance Patient tolerated treatment well   Patient Left in chair;with call bell/phone within reach;with chair alarm set   Nurse Communication          Time: 1610-9604 OT Time Calculation (min): 22 min  Charges: OT General Charges $OT Visit: 1 Visit OT Treatments $Self Care/Home Management : 8-22 mins  Arman Filter., MPH, MS, OTR/L ascom [phone] 05/07/23, 5:07 PM

## 2023-05-07 NOTE — Progress Notes (Addendum)
Progress Note   Patient: [name] [id number] DOB: [birth year] DOA: 05/01/2023     5 DOS: the patient was seen and examined on 05/07/2023   Brief hospital course: Holly S Langill is a 77 y.o. female with medical history significant for peripheral arterial disease, COPD on chronic 3-4 L home oxygen, hypertension, HFpEF(EF 45-50% 10/2022)) , remote breast cancer, colon cancer s/p resection, chronic back pain on buprenorphine patch and gastric ulcer hospitalized from 11/17 to 05/01/2023 at [facility] for perforated gastric ulcer with melena, requiring emergent surgery discharged the same day to SNF who presents to ED with shortness of breath.  Upon arriving to hospital, heart rate was 120, tachypnea 28, was placed on 6 L oxygen for significant hypoxemia.  Troponin was 24, BNP 340.  CT angiogram of the chest to rule out a PE, showed bilateral pleural effusion.  Patient was started on IV Lasix for CHF exacerbation. 12/12.  Volume status much better, changed to oral diuretics.   Principal Problem:   Acute on chronic heart failure with preserved ejection fraction (HFpEF) (HCC) Active Problems:   AKI (acute kidney injury) (HCC)   Acute on chronic respiratory failure with hypoxia (HCC)   COPD (chronic obstructive pulmonary disease) (HCC)   S/P exploratory laparotomy 04/08/23 for perforated gastric ulcer   HTN (hypertension)   PAD (peripheral artery disease) (HCC)   Hypokalemia   Anxiety and depression   Chronic back pain   Iron deficiency anemia   Chronic anemia   Pressure injury of skin   Assessment and Plan:  Acute on chronic heart failure with preserved ejection fraction (HFpEF) (HCC) Bilateral pleural effusion Acute on chronic respiratory failure with hypoxia Patient requiring 6 L to maintain sats mid 90s up from baseline at 3.5L. Increased work of breathing tachypneic to 28 and tachycardic, BNP 340 and CTA showing bilateral pleural effusions(negative for  PE) Echocardiogram 1 year ago showed ejection fraction 50 to 55%, repeat echocardiogram showed ejection ejection fraction 60 to 65% with grade 1 diastolic dysfunction. Patient was treated with IV Lasix, volume status much better today.  Renal function getting worse.  IV Lasix discontinued. Oxygenation is better, oxygen weaned down to 3.5 L. Condition continued to improve.  Continue oral torsemide and Aldactone. 12/15. Condition continued to be stable, patient request to reduce the dose of diuretics due to frequent urination.  Decrease torsemide to 20 mg daily. 12/16.  Patient condition has stabilized, currently pending nursing home placement.  Patient has short of breath with minimal exertion, appeared to have poor prognosis, will obtain palliative care.   Acute kidney injury secondary to diuretics. Hypokalemia. Repleted potassium, also added spironolactone. Potassium is still normal, renal function stable.   COPD (chronic obstructive pulmonary disease) (HCC) Appear at end-stage, palliative care consult obtained.   S/P exploratory laparotomy 04/08/23 for perforated gastric ulcer No acute issues suspected Hemoglobin stable Continue Protonix twice daily Continue Augmentin 875/125 twice daily, per [facility] note, she will be on Augmentin for a month.   HTN (hypertension) Continue home amlodipine 5 mg daily, losartan 25 daily   PAD (peripheral artery disease) (HCC) left SFA CTO with failed attempt in 01/2019 for revascularization & right calcified iliac artery stenosis  Continue atorvastatin Followed at [facility]   Chronic iron deficiency anemia History of melena 11/18 secondary to perforated gastric ulcer Patient has mild iron deficiency, B12 normal.  Continue oral iron therapy.   Chronic back pain Continue home medicines.   Anxiety and depression Restless leg syndrome/insomnia Xanax 0.25 daily as  needed anxiety, ropinirole 1 mg 3 times daily, melatonin 3 mg nightly Patient slept better after  giving Seroquel. Also added hydroxyzine as patient still has significant anxiety, could not increase dose of Xanax.    Pressure ulcers; POA Pressure Injury 05/02/23 Sacrum Stage 1 -  Intact skin with non-blanchable redness of a localized area usually over a bony prominence. (Active)  05/02/23 2039  Location: Sacrum  Location Orientation:   Staging: Stage 1 -  Intact skin with non-blanchable redness of a localized area usually over a bony prominence.  Wound Description (Comments):   Present on Admission:            Subjective:  Patient slept better after giving Seroquel. Short of breath at baseline, but has short of breath with minimal exertion.  Physical Exam: Vitals:   05/06/23 1256 05/06/23 1532 05/06/23 1935 05/07/23 0456  BP: (!) 160/75 (!) 121/57 (!) 152/80 (!) 184/89  Pulse: 66 67 91 82  Resp: 15 16 20 18   Temp:  97.7 F (36.5 C) 98.2 F (36.8 C) 98.2 F (36.8 C)  TempSrc:      SpO2: 100% 100% 95% 100%  Weight:      Height:       General exam: Appears calm and comfortable  Respiratory system: Decreased breathing sounds, labored breathing with any movement. Cardiovascular system: S1 & S2 heard, RRR. No JVD, murmurs, rubs, gallops or clicks. No pedal edema. Gastrointestinal system: Abdomen is nondistended, soft and nontender. No organomegaly or masses felt. Normal bowel sounds heard. Central nervous system: Alert and oriented, anxious. No focal neurological deficits. Extremities: Symmetric 5 x 5 power. Skin: No rashes, lesions or ulcers Psychiatry:  Mood & affect appropriate.    Data Reviewed:  Lab results reviewed.  Family Communication: None  Disposition: Status is: Inpatient Remains inpatient appropriate because: Unsafe discharge, pending nursing home placement.     Time spent: 35 minutes  Author: Marrion Coy, MD 05/07/2023 10:29 AM  For on call review [email or web address].

## 2023-05-08 DIAGNOSIS — N179 Acute kidney failure, unspecified: Secondary | ICD-10-CM | POA: Diagnosis not present

## 2023-05-08 DIAGNOSIS — Z7189 Other specified counseling: Secondary | ICD-10-CM

## 2023-05-08 DIAGNOSIS — J9621 Acute and chronic respiratory failure with hypoxia: Secondary | ICD-10-CM | POA: Diagnosis not present

## 2023-05-08 DIAGNOSIS — I5033 Acute on chronic diastolic (congestive) heart failure: Secondary | ICD-10-CM | POA: Diagnosis not present

## 2023-05-08 LAB — BASIC METABOLIC PANEL
Anion gap: 8 (ref 5–15)
BUN: 31 mg/dL — ABNORMAL HIGH (ref 8–23)
CO2: 36 mmol/L — ABNORMAL HIGH (ref 22–32)
Calcium: 10 mg/dL (ref 8.9–10.3)
Chloride: 96 mmol/L — ABNORMAL LOW (ref 98–111)
Creatinine, Ser: 1.33 mg/dL — ABNORMAL HIGH (ref 0.44–1.00)
GFR, Estimated: 41 mL/min — ABNORMAL LOW (ref >60)
Glucose, Bld: 116 mg/dL — ABNORMAL HIGH (ref 70–99)
Potassium: 3.8 mmol/L (ref 3.5–5.1)
Sodium: 140 mmol/L (ref 135–145)

## 2023-05-08 LAB — CBC
HCT: 29.2 % — ABNORMAL LOW (ref 36.0–46.0)
Hemoglobin: 8.8 g/dL — ABNORMAL LOW (ref 12.0–15.0)
MCH: 26.5 pg (ref 26.0–34.0)
MCHC: 30.1 g/dL (ref 30.0–36.0)
MCV: 88 fL (ref 80.0–100.0)
Platelets: 151 10*3/uL (ref 150–400)
RBC: 3.32 MIL/uL — ABNORMAL LOW (ref 3.87–5.11)
RDW: 15.6 % — ABNORMAL HIGH (ref 11.5–15.5)
WBC: 3.6 10*3/uL — ABNORMAL LOW (ref 4.0–10.5)
nRBC: 0 % (ref 0.0–0.2)

## 2023-05-08 MED ORDER — AMOXICILLIN-POT CLAVULANATE 500-125 MG PO TABS
1.0000 | ORAL_TABLET | Freq: Two times a day (BID) | ORAL | Status: DC
Start: 2023-05-08 — End: 2023-06-02
  Administered 2023-05-08 – 2023-05-10 (×4): 1 via ORAL
  Filled 2023-05-08 (×4): qty 1

## 2023-05-08 MED ORDER — ENOXAPARIN SODIUM 30 MG/0.3ML IJ SOSY
30.0000 mg | PREFILLED_SYRINGE | INTRAMUSCULAR | Status: DC
  Administered 2023-05-08: 30 mg via SUBCUTANEOUS
  Filled 2023-05-08: qty 0.3

## 2023-05-08 NOTE — Progress Notes (Signed)
   05/08/23 1400  Spiritual Encounters  Type of Visit Initial  Care provided to: Patient  Referral source Nurse (RN/NT/LPN)  Reason for visit Advance directives  OnCall Visit Yes   Chaplain responded to request for Advanced Directives.  Discussed paperwork with patient and left at bedside for follow up at a later date as needed.  Chaplain spiritual support services remain available as the need arises.

## 2023-05-08 NOTE — Progress Notes (Signed)
PHARMACY NOTE:  ANTIMICROBIAL RENAL DOSAGE ADJUSTMENT  Current antimicrobial regimen includes a mismatch between antimicrobial dosage and estimated renal function.  As per policy approved by the Pharmacy & Therapeutics and Medical Executive Committees, the antimicrobial dosage will be adjusted accordingly.  Current antimicrobial dosage:  Augmentin 875/100 mg BID until 03/01/2023  Indication: Bowel perforation, and post-op wound infection. Prescribed by [facility], taking on admission.  Renal Function:  Estimated Creatinine Clearance: 26.2 mL/min (A) (by C-G formula based on SCr of 1.33 mg/dL (H)).  Antimicrobial dosage has been changed to:  Augmentin 500/100 mg BID until 03/01/2023  Additional comments:  Thank you for allowing pharmacy to be a part of this patient's care.  Effie Shy, PharmD Pharmacy Resident  05/08/2023 12:48 PM

## 2023-05-08 NOTE — Progress Notes (Signed)
Progress Note   Patient: [name] ZOX:[id number] DOB: Jun 19, 1945 DOA: 05/01/2023     6 DOS: the patient was seen and examined on 05/08/2023   Brief hospital course: Holly S Gorby is a 77 y.o. female with medical history significant for peripheral arterial disease, COPD on chronic 3-4 L home oxygen, hypertension, HFpEF(EF 45-50% 10/2022)) , remote breast cancer, colon cancer s/p resection, chronic back pain on buprenorphine patch and gastric ulcer hospitalized from 11/17 to 05/01/2023 at [facility] for perforated gastric ulcer with melena, requiring emergent surgery discharged the same day to SNF who presents to ED with shortness of breath.  Upon arriving to hospital, heart rate was 120, tachypnea 28, was placed on 6 L oxygen for significant hypoxemia.  Troponin was 24, BNP 340.  CT angiogram of the chest to rule out a PE, showed bilateral pleural effusion.  Patient was started on IV Lasix for CHF exacerbation. 12/12.  Volume status much better, changed to oral diuretics.   Principal Problem:   Acute on chronic heart failure with preserved ejection fraction (HFpEF) (HCC) Active Problems:   AKI (acute kidney injury) (HCC)   Acute on chronic respiratory failure with hypoxia (HCC)   COPD (chronic obstructive pulmonary disease) (HCC)   S/P exploratory laparotomy 04/08/23 for perforated gastric ulcer   HTN (hypertension)   PAD (peripheral artery disease) (HCC)   Hypokalemia   Anxiety and depression   Chronic back pain   Iron deficiency anemia   Chronic anemia   Pressure injury of skin   Assessment and Plan:  Acute on chronic heart failure with preserved ejection fraction (HFpEF) (HCC) Bilateral pleural effusion Acute on chronic respiratory failure with hypoxia Patient requiring 6 L to maintain sats mid 90s up from baseline at 3.5L. Increased work of breathing tachypneic to 28 and tachycardic, BNP 340 and CTA showing bilateral pleural effusions(negative for  PE) Echocardiogram 1 year ago showed ejection fraction 50 to 55%, repeat echocardiogram showed ejection ejection fraction 60 to 65% with grade 1 diastolic dysfunction. Patient was treated with IV Lasix, volume status much better today.  Renal function getting worse.  IV Lasix discontinued. Oxygenation is better, oxygen weaned down to 3.5 L. Condition continued to improve.  Continue oral torsemide and Aldactone. 12/15. Condition continued to be stable, patient request to reduce the dose of diuretics due to frequent urination.  Decrease torsemide to 20 mg daily. 12/16.  Patient condition has stabilized, currently pending nursing home placement.  Patient has short of breath with minimal exertion, appeared to have poor prognosis, will obtain palliative care. 12/17.  Hold off diuretics because kidney function slightly worsened.  Still medically stable for discharge pending nursing placement.   Acute kidney injury secondary to diuretics. Hypokalemia. Repleted potassium, also added spironolactone. Function slightly worse today, hold off diuretics.  May resume home dose diuretics with dosing every 48 hours when renal function improves.   COPD (chronic obstructive pulmonary disease) (HCC) Appear at end-stage, palliative care consult obtained.   S/P exploratory laparotomy 04/08/23 for perforated gastric ulcer No acute issues suspected Hemoglobin stable Continue Protonix twice daily Continue Augmentin 875/125 twice daily, per [facility] note, she will be on Augmentin for a month.   HTN (hypertension) Continue home amlodipine 5 mg daily, losartan 25 daily   PAD (peripheral artery disease) (HCC) left SFA CTO with failed attempt in 01/2019 for revascularization & right calcified iliac artery stenosis  Continue atorvastatin Followed at [facility]   Chronic iron deficiency anemia History of melena 11/18 secondary to perforated  gastric ulcer Patient has mild iron deficiency, B12 normal.  Continue oral iron  therapy.   Chronic back pain Continue home medicines.   Anxiety and depression Restless leg syndrome/insomnia Xanax 0.25 daily as needed anxiety, ropinirole 1 mg 3 times daily, melatonin 3 mg nightly Patient slept better after giving Seroquel. Also added hydroxyzine as patient still has significant anxiety, could not increase dose of Xanax.    Pressure ulcers; POA Pressure Injury 05/02/23 Sacrum Stage 1 -  Intact skin with non-blanchable redness of a localized area usually over a bony prominence. (Active)  05/02/23 2039  Location: Sacrum  Location Orientation:   Staging: Stage 1 -  Intact skin with non-blanchable redness of a localized area usually over a bony prominence.  Wound Description (Comments):   Present on Admission:         Subjective:  Patient doing well today, she has no complaint.  But she has baseline short of breath with min exertion.  Physical Exam: Vitals:   05/07/23 1732 05/07/23 2028 05/08/23 0819 05/08/23 1214  BP: (!) 157/74 134/80 (!) 156/75 (!) 150/70  Pulse: 80 68 69 68  Resp: 18 19 17 16   Temp:   (!) 97.4 F (36.3 C) 97.8 F (36.6 C)  TempSrc:  Oral    SpO2: 96% 100% 100% 100%  Weight:      Height:       General exam: Appears calm and comfortable  Respiratory system: Clear to auscultation. Respiratory effort normal. Cardiovascular system: S1 & S2 heard, RRR. No JVD, murmurs, rubs, gallops or clicks. No pedal edema. Gastrointestinal system: Abdomen is nondistended, soft and nontender. No organomegaly or masses felt. Normal bowel sounds heard. Central nervous system: Alert and oriented x3. No focal neurological deficits. Extremities: Symmetric 5 x 5 power. Skin: No rashes, lesions or ulcers Psychiatry: Judgement and insight appear normal. Mood & affect appropriate.    Data Reviewed:  Lab results reviewed.  Family Communication: None  Disposition: Status is: Inpatient Remains inpatient appropriate because: Unsafe discharge, pending  nursing home placement.     Time spent: 35 minutes  Author: Marrion Coy, MD 05/08/2023 3:33 PM  For on call review [email or web address].

## 2023-05-08 NOTE — TOC Progression Note (Addendum)
Transition of Care [facility]) - Progression Note    Patient Details  Name: [name] MRN: [id number] Date of Birth: 05-Sep-1945  Transition [facility]) CM/SW Contact  Liliana Cline, LCSW Phone Number: 05/08/2023, 2:57 PM  Clinical Narrative:    Insurance auth pending.  3:31- Auth still pending.  4:10- Auth still pending.        Expected Discharge Plan and Services                                               Social Determinants of Health (SDOH) Interventions SDOH Screenings   Food Insecurity: Unknown (05/04/2023)  Housing: Low Risk  (05/04/2023)  Transportation Needs: No Transportation Needs (05/04/2023)  Utilities: Not At Risk (05/04/2023)  Financial Resource Strain: Low Risk  (04/17/2023)   [facility] from [facility]  Tobacco Use: High Risk (04/24/2023)   Received from [facility]    Readmission Risk Interventions    05/02/2023   12:22 PM 09/22/2021    1:38 PM  Readmission Risk Prevention Plan  Transportation Screening Complete Complete  PCP or Specialist Appt within 3-5 Days Complete   HRI or Home Care Consult Complete   Social Work Consult for Recovery Care Planning/Counseling Complete   Palliative Care Screening Not Applicable   Medication Review Oceanographer) Complete Complete  HRI or Home Care Consult  Complete  SW Recovery Care/Counseling Consult  Complete  Palliative Care Screening  Not Applicable  Skilled Nursing Facility  Not Applicable

## 2023-05-08 NOTE — TOC Progression Note (Addendum)
Transition of Care [facility]) - Progression Note    Patient Details  Name: Cyprus S Birky MRN: [id number] Date of Birth: 1945-08-20  Transition [facility] Care [facility]) CM/SW Contact  Margarito Liner, LCSW Phone Number: 05/08/2023, 8:44 AM  Clinical Narrative:   SNF insurance authorization is still pending.  12:59 pm: Still pending.  Expected Discharge Plan and Services                                               Social Determinants of Health (SDOH) Interventions SDOH Screenings   Food Insecurity: Unknown (05/04/2023)  Housing: Low Risk  (05/04/2023)  Transportation Needs: No Transportation Needs (05/04/2023)  Utilities: Not At Risk (05/04/2023)  Financial Resource Strain: Low Risk  (04/17/2023)   Received from [facility]  Tobacco Use: High Risk (04/24/2023)   Received from [facility]    Readmission Risk Interventions    05/02/2023   12:22 PM 09/22/2021    1:38 PM  Readmission Risk Prevention Plan  Transportation Screening Complete Complete  PCP or Specialist Appt within 3-5 Days Complete   HRI or Home Care Consult Complete   Social Work Consult for Recovery Care Planning/Counseling Complete   Palliative Care Screening Not Applicable   Medication Review Oceanographer) Complete Complete  HRI or Home Care Consult  Complete  SW Recovery Care/Counseling Consult  Complete  Palliative Care Screening  Not Applicable  Skilled Nursing Facility  Not Applicable

## 2023-05-08 NOTE — Consult Note (Addendum)
Consultation Note Date: 05/08/2023   Patient Name: [name]  DOB: Nov 19, 1945  MRN: [id number]  Age / Sex: 77 y.o., female  PCP: Ardyth Gal, MD Referring Physician: Marrion Coy, MD  Reason for Consultation: Establishing goals of care  HPI/Patient Profile: From H&P: Holly S Thorpe is a 77 y.o. female with medical history significant for peripheral arterial disease, COPD on chronic 3-4 L home oxygen, hypertension, HFpEF(EF 45-50% 10/2022)) , remote breast cancer, colon cancer s/p resection, chronic back pain on buprenorphine patch and gastric ulcer hospitalized from 11/17 to 05/01/2023 at [facility] for perforated gastric ulcer with melena, requiring emergent surgery discharged the same day to SNF who presents to ED with shortness of breath.  During her hospitalization, per discharge summary, patient had frequent episodes of respiratory distress with mild O2 desaturation with 'negative workup'.  She was discharged on 4 L. Patient states on arrival to rehab none of her medications were available and her shortness of breath worsened in the control with since she arrived prompting the visit to the ED.  She denies chest pain, cough, nausea vomiting or diaphoresis.  Denies any extremity swelling or pain.   Clinical Assessment and Goals of Care: Notes and labs reviewed. In to see patient. She is currently sitting in bedside chair. She states she is divorced; at baseline,  she lives alone with her [place]. Patient shares her dog is currently staying with her friend while she is in the hospital. She states she has children, but quickly changes the subject.   She discusses chronic pain and being followed by a pain clinic for this. She states she was doing well until she began to develop abdominal pain in October, which continued to worsen. She discusses her previous admission to [facility] and then discharge to SNF.    We discussed her diagnosis, prognosis, GOC, EOL wishes disposition and options.  Created space and opportunity for patient  to explore thoughts and feelings regarding current medical information.   A detailed discussion was had today regarding advanced directives.  Concepts specific to code status, artifical feeding and hydration, IV antibiotics and rehospitalization were discussed.  The difference between an aggressive medical intervention path and a comfort care path was discussed.  Values and goals of care important to patient and family were attempted to be elicited.  She states she would never want CPR, and would not want ventilator support. She states she would otherwise want to treat the treatable.   Patient states she would want her friend to be her surrogate healthcare decision maker and not her children. Discussed AD and HPOA and she is interested in completing the documents.         SUMMARY OF RECOMMENDATIONS   Patient would like HPOA/ AD packet be completed as patient would want her friend to be her HPOA. Spiritual care consult placed to assist in facilitating completion.   DNR/DNI. Continue to treat the treatable.   Prognosis:  Unable to determine      Primary Diagnoses: Present on Admission:  COPD (chronic obstructive pulmonary disease) (HCC)  HTN (hypertension)  Chronic back pain  Anxiety and depression  PAD (peripheral artery disease) (HCC)  Iron deficiency anemia  AKI (acute kidney injury) (HCC)  Hypokalemia   I have reviewed the medical record, interviewed the patient and family, and examined the patient. The following aspects are pertinent.  Past Medical History:  Diagnosis Date   Acute renal failure superimposed on stage 3 chronic kidney disease (HCC) 10/27/2014   Back pain, chronic    Breast cancer (HCC) 09/09/2010   Cancer of colon (HCC) 05/24/2011   Centrilobular emphysema (HCC) 07/15/2018   CKD (chronic kidney disease), stage III (HCC)     Claudication, intermittent (HCC) 07/15/2018   Depression    GIB (gastrointestinal bleeding)    H/O tobacco use, presenting hazards to health Quit Dec 2019 05/03/2011   50 years, up to 3PPD quit last year.    Hx of varicose veins of lower extremity 07/15/2018   Hyperlipidemia    Hypertension    SDH (subdural hematoma) (HCC)    SOB (shortness of breath) 07/15/2018   Social History   Socioeconomic History   Marital status: Divorced    Spouse name: Not on file   Number of children: 2   Years of education: Not on file   Highest education level: Not on file  Occupational History   Not on file  Tobacco Use   Smoking status: Former    Current packs/day: 0.00    Average packs/day: 0.3 packs/day for 30.0 years (7.5 ttl pk-yrs)    Types: Cigarettes    Start date: 10/30/1989    Quit date: 10/31/2019    Years since quitting: 3.5   Smokeless tobacco: Never   Tobacco comments:    had 6 cigarettes recently 03-10-20  Vaping Use   Vaping status: Never Used  Substance and Sexual Activity   Alcohol use: Yes    Comment: rarely    Drug use: No   Sexual activity: Not Currently  Other Topics Concern   Not on file  Social History Narrative   Lives alone in a one story home.  Has 2 children.     On disability for low back pain since the age of 83.     Education: high school.   Social Drivers of Corporate investment banker Strain: Low Risk  (04/17/2023)   Received from [facility], [facility]   Overall Financial Resource Strain (CARDIA)    Difficulty of Paying Living Expenses: Not hard at all  Food Insecurity: Unknown (05/04/2023)   Hunger Vital Sign    Worried About Running Out of Food in the Last Year: Patient declined    Ran Out of Food in the Last Year: Not on file  Transportation Needs: No Transportation Needs (05/04/2023)   PRAPARE - Administrator, Civil Service (Medical): No    Lack of Transportation (Non-Medical): No  Physical Activity: Not on file   Stress: Not on file  Social Connections: Not on file   Family History  Problem Relation Age of Onset   Diabetes Mother    Hypertension Mother    Cancer Mother        leukemia   Sudden death Mother    Heart attack Mother    Anesthesia problems Neg Hx    Hypotension Neg Hx    Malignant hyperthermia Neg Hx    Pseudochol deficiency Neg Hx    Breast cancer Neg Hx    Lung disease  Neg Hx    Scheduled Meds:  amoxicillin-clavulanate  1 tablet Oral Q12H   buprenorphine  1 patch Transdermal Weekly   enoxaparin (LOVENOX) injection  40 mg Subcutaneous Q24H   gabapentin  600 mg Oral TID   iron polysaccharides  150 mg Oral Daily   losartan  25 mg Oral Daily   melatonin  2.5 mg Oral QHS   pantoprazole  40 mg Oral BID   QUEtiapine  25 mg Oral QHS   rOPINIRole  1.5 mg Oral TID   spironolactone  25 mg Oral Daily   torsemide  20 mg Oral Daily   Continuous Infusions: PRN Meds:.acetaminophen **OR** acetaminophen, ALPRAZolam, HYDROmorphone, hydrOXYzine, ipratropium-albuterol, methocarbamol, ondansetron **OR** ondansetron (ZOFRAN) IV Medications Prior to Admission:  Prior to Admission medications   Medication Sig Start Date End Date Taking? Authorizing Provider  acetaminophen (TYLENOL) 650 MG CR tablet Take 650 mg by mouth every 8 (eight) hours as needed for pain.   Yes [provider]  ALPRAZolam Prudy Feeler) 1 MG tablet Take 1 tablet (1 mg total) by mouth 2 (two) times daily as needed. 11/09/20  Yes Sheikh, Omair Latif, DO  amLODipine (NORVASC) 5 MG tablet Take 1 tablet (5 mg total) by mouth daily. 07/04/22  Yes Yates Decamp, MD  atorvastatin (LIPITOR) 10 MG tablet Take 1 tablet (10 mg total) by mouth daily. 04/18/23  Yes Yates Decamp, MD  buprenorphine (BUTRANS) 10 MCG/HR PTWK Place 1 patch onto the skin once a week.   Yes [provider]  Cholecalciferol 25 MCG (1000 UT) TBDP Take 1,000 Units by mouth daily.   Yes [provider]  cyanocobalamin (VITAMIN B12) 1000 MCG tablet  Take 1,000 mcg by mouth daily.   Yes [provider]  furosemide (LASIX) 20 MG tablet Take 1 tablet (20 mg total) by mouth daily as needed for fluid or edema. Patient taking differently: Take 20 mg by mouth every other day. 07/04/22  Yes Yates Decamp, MD  gabapentin (NEURONTIN) 300 MG capsule Take 1 capsule (300 mg total) by mouth 3 (three) times daily. Patient taking differently: Take 600 mg by mouth 3 (three) times daily. 07/04/22  Yes Yates Decamp, MD  ipratropium-albuterol (DUONEB) 0.5-2.5 (3) MG/3ML SOLN Take 3 mLs by nebulization every 6 (six) hours as needed. 07/12/22  Yes Cobb, Ruby Cola, NP  losartan (COZAAR) 25 MG tablet Take 25 mg by mouth daily.   Yes [provider]  Multiple Vitamins-Minerals (MULTIVITAMIN ADULT EXTRA C) CHEW    Yes [provider]  potassium chloride (KLOR-CON) 10 MEQ tablet Take 1 tablet (10 mEq total) by mouth daily as needed (With Furosemide). Take with lasix 07/04/22  Yes Yates Decamp, MD  rOPINIRole (REQUIP) 1 MG tablet Take 1 tablet (1 mg total) by mouth 3 (three) times daily. Patient taking differently: Take 1.5 mg by mouth 3 (three) times daily. 03/27/23  Yes Yates Decamp, MD  doxycycline (VIBRAMYCIN) 100 MG capsule Take 1 capsule (100 mg total) by mouth 2 (two) times daily. Patient not taking: Reported on 05/02/2023 02/26/23   Georgiana Spinner, NP   No Known Allergies Review of Systems  Musculoskeletal:        Complains of right hip and thigh pain.    Physical Exam Pulmonary:     Effort: Pulmonary effort is normal.  Neurological:     Mental Status: She is alert.     Vital Signs: BP (!) 156/75 (BP Location: Left Arm)   Pulse 69   Temp (!) 97.4 F (36.3  C)   Resp 17   Ht 4\' 10"  (1.473 m)   Wt 54.1 kg   SpO2 100%   BMI 24.93 kg/m  Pain Scale: 0-10 POSS *See Group Information*: 1-Acceptable,Awake and alert Pain Score: 2    SpO2: SpO2: 100 % O2 Device:SpO2: 100 % O2 Flow Rate: .O2 Flow Rate (L/min): 3 L/min  IO:  Intake/output summary: No intake or output data in the 24 hours ending 05/08/23 1100  LBM: Last BM Date : 05/07/23 Baseline Weight: Weight: 51.3 kg Most recent weight: Weight:  (PT Refused don't want to be bothered ( RN aware))       Signed by: Morton Stall, NP   Please contact Palliative Medicine Team phone at [phone] for questions and concerns.  For individual provider: See Loretha Stapler

## 2023-05-08 NOTE — Progress Notes (Signed)
PHARMACIST - PHYSICIAN COMMUNICATION  CONCERNING:  Enoxaparin (Lovenox) for DVT Prophylaxis    RECOMMENDATION: Patient was prescribed enoxaprin 40mg  q24 hours for VTE prophylaxis.   Filed Weights   05/01/23 2230 05/04/23 0500 05/05/23 0500  Weight: 51.3 kg (113 lb) 51 kg (112 lb 7 oz) 54.1 kg (119 lb 4.8 oz)    Body mass index is 24.93 kg/m.  Estimated Creatinine Clearance: 26.2 mL/min (A) (by C-G formula based on SCr of 1.33 mg/dL (H)).  Patient is candidate for enoxaparin 30mg  every 24 hours based on CrCl <86ml/min or Weight <45kg  DESCRIPTION: Pharmacy has adjusted enoxaparin dose per [facility] policy.  Patient is now receiving enoxaparin 30 mg every 24 hours   Effie Shy, PharmD Pharmacy Resident  05/08/2023 12:46 PM

## 2023-05-08 NOTE — Plan of Care (Signed)
  Problem: Safety: Goal: Ability to remain free from injury will improve Outcome: Progressing   Problem: Pain Management: Goal: General experience of comfort will improve Outcome: Progressing   Problem: Clinical Measurements: Goal: Ability to maintain clinical measurements within normal limits will improve Outcome: Progressing

## 2023-05-08 NOTE — Progress Notes (Signed)
Pt requested that she not be disturbed for vital signs at midnight or at 0400. Pt requested we wait until she wakes up and needs to go to the bathroom in the morning to do the VS.

## 2023-05-09 DIAGNOSIS — I5033 Acute on chronic diastolic (congestive) heart failure: Secondary | ICD-10-CM | POA: Diagnosis not present

## 2023-05-09 LAB — BASIC METABOLIC PANEL
Anion gap: 7 (ref 5–15)
BUN: 30 mg/dL — ABNORMAL HIGH (ref 8–23)
CO2: 34 mmol/L — ABNORMAL HIGH (ref 22–32)
Calcium: 10.1 mg/dL (ref 8.9–10.3)
Chloride: 100 mmol/L (ref 98–111)
Creatinine, Ser: 0.99 mg/dL (ref 0.44–1.00)
GFR, Estimated: 59 mL/min — ABNORMAL LOW (ref >60)
Glucose, Bld: 86 mg/dL (ref 70–99)
Potassium: 4.2 mmol/L (ref 3.5–5.1)
Sodium: 141 mmol/L (ref 135–145)

## 2023-05-09 MED ORDER — TORSEMIDE 20 MG PO TABS
20.0000 mg | ORAL_TABLET | Freq: Every day | ORAL | Status: DC
  Administered 2023-05-10 – 2023-05-12 (×3): 20 mg via ORAL
  Filled 2023-05-09 (×3): qty 1

## 2023-05-09 MED ORDER — SPIRONOLACTONE 25 MG PO TABS
25.0000 mg | ORAL_TABLET | Freq: Every day | ORAL | Status: DC
  Administered 2023-05-10 – 2023-05-15 (×6): 25 mg via ORAL
  Filled 2023-05-09 (×6): qty 1

## 2023-05-09 MED ORDER — ENOXAPARIN SODIUM 40 MG/0.4ML IJ SOSY
40.0000 mg | PREFILLED_SYRINGE | INTRAMUSCULAR | Status: DC
  Administered 2023-05-09 – 2023-05-10 (×2): 40 mg via SUBCUTANEOUS
  Filled 2023-05-09 (×2): qty 0.4

## 2023-05-09 NOTE — TOC Progression Note (Addendum)
Transition of Care [facility]) - Progression Note    Patient Details  Name: [name] MRN: [id number] Date of Birth: [birth year]  [facility]) CM/SW Contact  Margarito Liner, LCSW Phone Number: 05/09/2023, 9:10 AM  Clinical Narrative:   SNF insurance authorization is still pending.  11:19 am: CSW updated patient. Notified her that [facility] social worker would assist with looking for an ALF. Patient also asked about potentially having an aide come to the home. She gave CSW permission to make referral to the [facility] program. CSW left voicemail for Angelica.   12:48 pm: Insurance authorization is still pending.  Expected Discharge Plan and Services                                               Social Determinants of Health (SDOH) Interventions SDOH Screenings   Food Insecurity: Unknown (05/04/2023)  Housing: Low Risk  (05/04/2023)  Transportation Needs: No Transportation Needs (05/04/2023)  Utilities: Not At Risk (05/04/2023)  Financial Resource Strain: Low Risk  (04/17/2023)   Received from [facility], [facility]  Tobacco Use: High Risk (04/24/2023)   Received from [facility]    Readmission Risk Interventions    05/02/2023   12:22 PM 09/22/2021    1:38 PM  Readmission Risk Prevention Plan  Transportation Screening Complete Complete  PCP or Specialist Appt within 3-5 Days Complete   HRI or Home Care Consult Complete   Social Work Consult for Recovery Care Planning/Counseling Complete   Palliative Care Screening Not Applicable   Medication Review Oceanographer) Complete Complete  HRI or Home Care Consult  Complete  SW Recovery Care/Counseling Consult  Complete  Palliative Care Screening  Not Applicable  Skilled Nursing Facility  Not Applicable

## 2023-05-09 NOTE — Plan of Care (Signed)
  Problem: Education: Goal: Knowledge of General Education information will improve Description: Including pain rating scale, medication(s)/side effects and non-pharmacologic comfort measures Outcome: Progressing   Problem: Health Behavior/Discharge Planning: Goal: Ability to manage health-related needs will improve Outcome: Progressing   Problem: Clinical Measurements: Goal: Ability to maintain clinical measurements within normal limits will improve Outcome: Progressing Goal: Will remain free from infection Outcome: Progressing Goal: Respiratory complications will improve Outcome: Progressing Goal: Cardiovascular complication will be avoided Outcome: Progressing   Problem: Activity: Goal: Risk for activity intolerance will decrease Outcome: Progressing   Problem: Coping: Goal: Level of anxiety will decrease Outcome: Progressing   Problem: Elimination: Goal: Will not experience complications related to urinary retention Outcome: Progressing   Problem: Pain Management: Goal: General experience of comfort will improve Outcome: Progressing

## 2023-05-09 NOTE — Plan of Care (Signed)

## 2023-05-09 NOTE — Care Management Important Message (Signed)
Important Message  Patient Details  Name: [name] MRN: [id number] Date of Birth: 04/26/1946   Important Message Given:  Yes - Medicare IM     Bernadette Hoit 05/09/2023, 3:03 PM

## 2023-05-09 NOTE — Progress Notes (Signed)
Progress Note   Patient: [name] VHQ:[id number] DOB: [birth year] DOA: 05/01/2023     7 DOS: the patient was seen and examined on 05/09/2023   Brief hospital course:  [name] is a 77 y.o. female with medical history significant for peripheral arterial disease, COPD on chronic 3-4 L home oxygen, hypertension, HFpEF(EF 45-50% 10/2022)) , remote breast cancer, colon cancer s/p resection, chronic back pain on buprenorphine patch and gastric ulcer hospitalized from 11/17 to 05/01/2023 at [facility] for perforated gastric ulcer with melena, requiring emergent surgery discharged the same day to SNF who presents to ED with shortness of breath.  Upon arriving to hospital, heart rate was 120, tachypnea 28, was placed on 6 L oxygen for significant hypoxemia.  Troponin was 24, BNP 340.  CT angiogram of the chest to rule out a PE, showed bilateral pleural effusion.  Patient was started on IV Lasix for CHF exacerbation. 12/12.  Volume status much better, changed to oral diuretics.     Principal Problem:   Acute on chronic heart failure with preserved ejection fraction (HFpEF) (HCC) Active Problems:   AKI (acute kidney injury) (HCC)   Acute on chronic respiratory failure with hypoxia (HCC)   COPD (chronic obstructive pulmonary disease) (HCC)   S/P exploratory laparotomy 04/08/23 for perforated gastric ulcer   HTN (hypertension)   PAD (peripheral artery disease) (HCC)   Hypokalemia   Anxiety and depression   Chronic back pain   Iron deficiency anemia   Chronic anemia   Pressure injury of skin    Assessment and Plan:  Acute on chronic heart failure with preserved ejection fraction (HFpEF) (HCC) Bilateral pleural effusion Acute on chronic respiratory failure with hypoxia Patient initially required 6 L to maintain sats mid 90s up from baseline at 3.5L. Currently back to her baseline home oxygen requirement. Increased work of breathing tachypneic to 28 and tachycardic, BNP 340  and CTA showing bilateral pleural effusions(negative for PE) Echocardiogram 1 year ago showed ejection fraction 50 to 55%, repeat echocardiogram showed ejection ejection fraction 60 to 65% with grade 1 diastolic dysfunction. Patient was treated with IV Lasix, volume status much better today.  Renal function getting worse.  IV Lasix discontinued. Condition continued to improve.   Continue oral torsemide and Aldactone. 12/15. Condition continued to be stable, patient request to reduce the dose of diuretics due to frequent urination.  Decrease torsemide to 20 mg daily. 12/16.  Patient condition has stabilized, currently pending nursing home placement.  Patient has short of breath with minimal exertion, appeared to have poor prognosis, will obtain palliative care. 12/17.  Hold off diuretics because kidney function slightly worsened.  Still medically stable for discharge pending nursing placement. 12/18 Renal function is back to baseline   Acute kidney injury secondary to diuretics. Hypokalemia. Repleted potassium, also added spironolactone. Function slightly worse today, hold off diuretics.  May resume home dose diuretics with dosing every 48 hours when renal function improves.   COPD (chronic obstructive pulmonary disease) (HCC) Appear at end-stage, palliative care consult obtained.   S/P exploratory laparotomy 04/08/23 for perforated gastric ulcer No acute issues suspected Hemoglobin stable Continue Protonix twice daily Continue Augmentin 875/125 twice daily, per [facility] note, she will be on Augmentin for a month.  Will discontinue after dose today   HTN (hypertension) Continue home amlodipine 5 mg daily, losartan 25 daily   PAD (peripheral artery disease) (HCC) left SFA CTO with failed attempt in 01/2019 for revascularization & right calcified iliac artery stenosis  Continue atorvastatin Followed at [facility]   Chronic iron deficiency anemia History of melena 11/18 secondary to perforated  gastric ulcer Patient has mild iron deficiency, B12 normal.  Continue oral iron therapy.   Chronic back pain Continue home medicines.   Anxiety and depression Restless leg syndrome/insomnia Xanax 0.25 daily as needed anxiety, ropinirole 1 mg 3 times daily, melatonin 3 mg nightly Patient slept better after giving Seroquel. Also added hydroxyzine as patient still has significant anxiety, could not increase dose of Xanax.    Pressure ulcers; POA Pressure Injury 05/02/23 Sacrum Stage 1 -  Intact skin with non-blanchable redness of a localized area usually over a bony prominence. (Active)  05/02/23 2039  Location: Sacrum  Location Orientation:   Staging: Stage 1 -  Intact skin with non-blanchable redness of a localized area usually over a bony prominence.  Wound Description (Comments):   Present on Admission:              Subjective: No new complaints.  Physical Exam: Vitals:   05/08/23 1959 05/08/23 2309 05/09/23 0327 05/09/23 1208  BP: (!) 173/66 (!) 146/64 (!) 154/73 (!) 164/72  Pulse: 73 74 86 65  Resp: (!) 22 18 18    Temp: 97.8 F (36.6 C) 98 F (36.7 C) 98.8 F (37.1 C) 98.1 F (36.7 C)  TempSrc: Oral Oral Oral   SpO2: 99% 100% 97% 100%  Weight:      Height:       General exam: Appears calm and comfortable  Respiratory system: Clear to auscultation. Respiratory effort normal. Cardiovascular system: S1 & S2 heard, RRR. No JVD, murmurs, rubs, gallops or clicks. No pedal edema. Gastrointestinal system: Abdomen is nondistended, soft and nontender. No organomegaly or masses felt. Normal bowel sounds heard. Central nervous system: Alert and oriented x3. No focal neurological deficits. Extremities: Symmetric 5 x 5 power. Skin: No rashes, lesions or ulcers Psychiatry: Judgement and insight appear normal. Mood & affect appropriate.    Data Reviewed: Labs reviewed.  Improved serum creatinine 1.33 >> 0.9 There are no new results to review at this time.  Family  Communication: Plan of care was discussed with patient in detail.  Disposition: Status is: Inpatient Remains inpatient appropriate because: Awaiting placement but  Planned Discharge Destination: Skilled nursing facility    Time spent: 33 minutes  Author: Lucile Shutters, MD 05/09/2023 1:42 PM  For on call review [email or web address].

## 2023-05-09 NOTE — Progress Notes (Signed)
Physical Therapy Treatment Patient Details Name: [name] MRN: [id number] DOB: 1945/07/18 Today's Date: 05/09/2023   History of Present Illness 77 y.o. female with medical history significant for peripheral arterial disease, COPD on chronic 3-4 L home oxygen, hypertension, HFpEF(EF 45-50% 10/2022)) , remote breast cancer, colon cancer s/p resection, chronic back pain on buprenorphine patch and gastric ulcer hospitalized from 11/17 to 05/01/2023 at [facility] for perforated gastric ulcer with melena, requiring emergent surgery discharged the same day to SNF who presents to ED with shortness of breath. Patient was started on IV Lasix for CHF exacerbation.    PT Comments  Pt was sitting in recliner with RN present upon arrival. She is alert and oriented and agreeable to session. Pt reports baseline O2 needs of 3 L. She remains on O2 throughout session with so2 > 91% on o2. Pt still requires some assistance for safe transfers and ambulation. She remains at hgh risk of falls. Author recommend STR at DC to maximize her independence and safety prior to pt returning home alone. Pt does not have available assistance and currently requires assistance for all safe ADLs.     If plan is discharge home, recommend the following: A little help with walking and/or transfers;A little help with bathing/dressing/bathroom;Assistance with cooking/housework;Direct supervision/assist for medications management;Direct supervision/assist for financial management;Assist for transportation;Help with stairs or ramp for entrance     Equipment Recommendations  Other (comment) (Defer to next level of care)       Precautions / Restrictions Precautions Precautions: Fall Restrictions Weight Bearing Restrictions Per Provider Order: No     Mobility  Bed Mobility    General bed mobility comments: Pt was in recliner pre/post session    Transfers Overall transfer level: Needs assistance Equipment used: Rolling  walker (2 wheels) Transfers: Sit to/from Stand Sit to Stand: Supervision, Contact guard assist   Ambulation/Gait Ambulation/Gait assistance: Contact guard assist, Supervision Gait Distance (Feet): 50 Feet Assistive device: Rolling walker (2 wheels) Gait Pattern/deviations: Step-through pattern, Trunk flexed, Decreased stride length Gait velocity: decreased  General Gait Details: Pt ambulated 2 x 50 ft with RW with prolonged seated rest between gat trials. Pt is severely deconditioned however sao2 and HR remains stable throughout. Constant vsc and encouragement to stand more erect. pt has hx of LBP/back surgeries in past   Balance Overall balance assessment: Needs assistance Sitting-balance support: No upper extremity supported, Feet supported Sitting balance-Leahy Scale: Good     Standing balance support: Bilateral upper extremity supported, During functional activity Standing balance-Leahy Scale: Fair Standing balance comment: reliant on RW for all dynamic standing activity. pt is at high risk of falls with o2 demnds and limited activity tolerance.       Cognition Arousal: Alert Behavior During Therapy: WFL for tasks assessed/performed Overall Cognitive Status: Within Functional Limits for tasks assessed    General Comments: pt is A and O x 4 however author questions if pt fully understands her limitations/deficits               Pertinent Vitals/Pain Pain Assessment Pain Assessment: 0-10 Pain Score: 7  Pain Location: LBP Pain Descriptors / Indicators: Aching Pain Intervention(s): Limited activity within patient's tolerance, Premedicated before session, Monitored during session     PT Goals (current goals can now be found in the care plan section) Acute Rehab PT Goals Patient Stated Goal: regain independence Progress towards PT goals: Progressing toward goals    Frequency    Min 1X/week       AM-PAC  PT "6 Clicks" Mobility   Outcome Measure  Help needed  turning from your back to your side while in a flat bed without using bedrails?: A Little Help needed moving from lying on your back to sitting on the side of a flat bed without using bedrails?: A Little Help needed moving to and from a bed to a chair (including a wheelchair)?: A Little Help needed standing up from a chair using your arms (e.g., wheelchair or bedside chair)?: A Little Help needed to walk in hospital room?: A Little Help needed climbing 3-5 steps with a railing? : A Lot 6 Click Score: 17    End of Session Equipment Utilized During Treatment: Oxygen (3L at rest, 4 L during ambulation) Activity Tolerance: Patient tolerated treatment well;Patient limited by fatigue Patient left: in chair;with call bell/phone within reach;with chair alarm set;with nursing/sitter in room Nurse Communication: Mobility status PT Visit Diagnosis: Muscle weakness (generalized) (M62.81);Unsteadiness on feet (R26.81)     Time: 9604-5409 PT Time Calculation (min) (ACUTE ONLY): 24 min  Charges:    $Gait Training: 8-22 mins $Therapeutic Activity: 8-22 mins PT General Charges $$ ACUTE PT VISIT: 1 Visit                     Jetta Lout PTA 05/09/23, 2:18 PM

## 2023-05-10 ENCOUNTER — Encounter: Payer: Self-pay | Admitting: Internal Medicine

## 2023-05-10 DIAGNOSIS — I5033 Acute on chronic diastolic (congestive) heart failure: Secondary | ICD-10-CM | POA: Diagnosis not present

## 2023-05-10 LAB — CBC
HCT: 28.1 % — ABNORMAL LOW (ref 36.0–46.0)
Hemoglobin: 8.4 g/dL — ABNORMAL LOW (ref 12.0–15.0)
MCH: 26.6 pg (ref 26.0–34.0)
MCHC: 29.9 g/dL — ABNORMAL LOW (ref 30.0–36.0)
MCV: 88.9 fL (ref 80.0–100.0)
Platelets: 124 10*3/uL — ABNORMAL LOW (ref 150–400)
RBC: 3.16 MIL/uL — ABNORMAL LOW (ref 3.87–5.11)
RDW: 15.8 % — ABNORMAL HIGH (ref 11.5–15.5)
WBC: 4.2 10*3/uL (ref 4.0–10.5)
nRBC: 0 % (ref 0.0–0.2)

## 2023-05-10 NOTE — Progress Notes (Signed)
Progress Note   Patient: [name] ZOX:[id number] DOB: Jun 13, 1945 DOA: 05/01/2023     8 DOS: the patient was seen and examined on 05/10/2023   Brief hospital course:  Holly S Batterton is a 77 y.o. female with medical history significant for peripheral arterial disease, COPD on chronic 3-4 L home oxygen, hypertension, HFpEF(EF 45-50% 10/2022)) , remote breast cancer, colon cancer s/p resection, chronic back pain on buprenorphine patch and gastric ulcer hospitalized from 11/17 to 05/01/2023 at [facility] for perforated gastric ulcer with melena, requiring emergent surgery discharged the same day to SNF who presents to ED with shortness of breath.  Upon arriving to hospital, heart rate was 120, tachypnea 28, was placed on 6 L oxygen for significant hypoxemia.  Troponin was 24, BNP 340.  CT angiogram of the chest to rule out a PE, showed bilateral pleural effusion.  Patient was started on IV Lasix for CHF exacerbation. 12/12.  Volume status much better, changed to oral diuretics.     Principal Problem:   Acute on chronic heart failure with preserved ejection fraction (HFpEF) (HCC) Active Problems:   AKI (acute kidney injury) (HCC)   Acute on chronic respiratory failure with hypoxia (HCC)   COPD (chronic obstructive pulmonary disease) (HCC)   S/P exploratory laparotomy 04/08/23 for perforated gastric ulcer   HTN (hypertension)   PAD (peripheral artery disease) (HCC)   Hypokalemia   Anxiety and depression   Chronic back pain   Iron deficiency anemia   Chronic anemia   Pressure injury of skin      Assessment and Plan:  Acute on chronic heart failure with preserved ejection fraction (HFpEF) (HCC) Bilateral pleural effusion Acute on chronic respiratory failure with hypoxia Patient initially required 6 L to maintain sats in the mid 90s up from baseline at 3.5L. Currently back to her baseline home oxygen requirement. Increased work of breathing tachypneic to 28 and tachycardic,  BNP 340 and CTA showing bilateral pleural effusions(negative for PE) Echocardiogram 1 year ago showed ejection fraction 50 to 55%, repeat echocardiogram showed ejection ejection fraction 60 to 65% with grade 1 diastolic dysfunction. Patient was treated with IV Lasix, volume status much better today.   12/15. Condition continued to be stable, patient request to reduce the dose of diuretics due to frequent urination.  Decrease torsemide to 20 mg daily. 12/16.  Patient condition has stabilized, currently pending nursing home placement.  Patient has short of breath with minimal exertion, appeared to have poor prognosis, will obtain palliative care. 12/17.  Hold off diuretics because kidney function slightly worsened.  Still medically stable for discharge pending nursing placement. 12/18 Renal function is back to baseline.  Resume diuretic therapy    Acute kidney injury secondary to diuretics. Hypokalemia. Repleted potassium, also added spironolactone. Function slightly worse today, hold off diuretics.  May resume home dose diuretics with dosing every 48 hours when renal function improves.    COPD (chronic obstructive pulmonary disease) (HCC) Appear at end-stage, palliative care consult obtained.   S/P exploratory laparotomy 04/08/23 for perforated gastric ulcer No acute issues suspected Hemoglobin stable Continue Protonix twice daily Continue Augmentin 875/125 twice daily, per [facility] note, she will be on Augmentin for a month.  Will discontinue after dose today   HTN (hypertension) Continue home amlodipine 5 mg daily, losartan 25 daily   PAD (peripheral artery disease) (HCC) left SFA CTO with failed attempt in 01/2019 for revascularization & right calcified iliac artery stenosis  Continue atorvastatin Followed at [facility]   Chronic  iron deficiency anemia History of melena 11/18 secondary to perforated gastric ulcer Patient has mild iron deficiency, B12 normal.  Continue oral iron therapy.    Chronic back pain Continue home medicines.   Anxiety and depression Restless leg syndrome/insomnia Xanax 0.25 daily as needed anxiety, ropinirole 1 mg 3 times daily, melatonin 3 mg nightly Patient slept better after giving Seroquel. Also added hydroxyzine as patient still has significant anxiety, could not increase dose of Xanax.    Pressure ulcers; POA Pressure Injury 05/02/23 Sacrum Stage 1 -  Intact skin with non-blanchable redness of a localized area usually over a bony prominence. (Active)  05/02/23 2039  Location: Sacrum  Location Orientation:   Staging: Stage 1 -  Intact skin with non-blanchable redness of a localized area usually over a bony prominence.  Wound Description (Comments):   Present on Admission:       Physical deconditioning PT recommended SNF which was declined by her insurance Peer-to-peer review done, discussed with medical director at home and community care transition who declines placement at this time because PT notes show that patient is doing better. Discussed recommendation with patient who plans to appeal          Subjective: Short of breath with minimal exertion  Physical Exam: Vitals:   05/10/23 0429 05/10/23 0431 05/10/23 0500 05/10/23 0737  BP: (!) 133/56 132/70  (!) 169/109  Pulse: 62 64  79  Resp: 17   18  Temp: 98.4 F (36.9 C) 98 F (36.7 C)  97.8 F (36.6 C)  TempSrc: Oral Oral    SpO2: 99% 98%  98%  Weight:   51 kg   Height:       General exam: Appears calm and comfortable  Respiratory system: Bilateral air entry.  Scattered expiratory wheezes Cardiovascular system: S1 & S2 heard, RRR. No JVD, murmurs, rubs, gallops or clicks. No pedal edema. Gastrointestinal system: Abdomen is nondistended, soft and nontender. No organomegaly or masses felt. Normal bowel sounds heard. Central nervous system: Alert and oriented x3. No focal neurological deficits. Extremities: Symmetric 5 x 5 power. Skin: No rashes, lesions or  ulcers Psychiatry: Judgement and insight appear normal. Mood & affect appropriate.     Data Reviewed: Labs reviewed There are no new results to review at this time.  Family Communication: Plan of care discussed with patient in detail.    Disposition: Status is: Inpatient Remains inpatient appropriate because: Awaiting discharge  Planned Discharge Destination: Skilled nursing facility    Time spent: 35 minutes  Author: Lucile Shutters, MD 05/10/2023 2:36 PM  For on call review [email or web address].

## 2023-05-10 NOTE — Plan of Care (Signed)
  Problem: Education: Goal: Knowledge of General Education information will improve Description Including pain rating scale, medication(s)/side effects and non-pharmacologic comfort measures Outcome: Progressing   Problem: Health Behavior/Discharge Planning: Goal: Ability to manage health-related needs will improve Outcome: Progressing   

## 2023-05-10 NOTE — TOC Progression Note (Addendum)
Transition of Care [facility]) - Progression Note    Patient Details  Name: Cyprus S Koci MRN: [id number] Date of Birth: 1945-05-28  Transition of Care [facility]) CM/SW Contact  Margarito Liner, LCSW Phone Number: 05/10/2023, 8:45 AM  Clinical Narrative:  Insurance is offering a Audiological scientist review for SNF authorization. CSW sent details to MD in a secure chat. Deadline is today at 12:00 pm.   10:33 am: Peer-to-peer complete but auth denied. Patient will appeal.  12:37 pm: CSW faxed clinicals to [facility] appeals department.  3:14 pm: Received call back from Angelica with the CAP program. CSW made referral.  4:50 pm: CSW unable to secure home health PT. Patient and family are aware. They will request an outpatient PT referral when she establishes care with her new PCP.  Expected Discharge Plan and Services                                               Social Determinants of Health (SDOH) Interventions SDOH Screenings   Food Insecurity: Unknown (05/04/2023)  Housing: Low Risk  (05/04/2023)  Transportation Needs: No Transportation Needs (05/04/2023)  Utilities: Not At Risk (05/04/2023)  Financial Resource Strain: Low Risk  (04/17/2023)   Received from [facility], [facility]  Tobacco Use: High Risk (04/24/2023)   Received from [facility]    Readmission Risk Interventions    05/02/2023   12:22 PM 09/22/2021    1:38 PM  Readmission Risk Prevention Plan  Transportation Screening Complete Complete  PCP or Specialist Appt within 3-5 Days Complete   HRI or Home Care Consult Complete   Social Work Consult for Recovery Care Planning/Counseling Complete   Palliative Care Screening Not Applicable   Medication Review Oceanographer) Complete Complete  HRI or Home Care Consult  Complete  SW Recovery Care/Counseling Consult  Complete  Palliative Care Screening  Not Applicable  Skilled Nursing Facility  Not Applicable

## 2023-05-10 NOTE — Progress Notes (Signed)
Pt transferred to 1C, report given to Robert,RN. No distress noted.

## 2023-05-11 ENCOUNTER — Encounter: Payer: Self-pay | Admitting: Internal Medicine

## 2023-05-11 DIAGNOSIS — I5033 Acute on chronic diastolic (congestive) heart failure: Secondary | ICD-10-CM | POA: Diagnosis not present

## 2023-05-11 MED ORDER — ENSURE ENLIVE PO LIQD
237.0000 mL | Freq: Two times a day (BID) | ORAL | Status: DC
  Administered 2023-05-11 – 2023-05-13 (×2): 237 mL via ORAL

## 2023-05-11 MED ORDER — LOSARTAN POTASSIUM 50 MG PO TABS: 50.0000 mg | ORAL_TABLET | Freq: Every day | ORAL | Status: DC

## 2023-05-11 MED ORDER — LOSARTAN POTASSIUM 25 MG PO TABS
25.0000 mg | ORAL_TABLET | Freq: Every day | ORAL | Status: DC
  Administered 2023-05-12 – 2023-05-15 (×3): 25 mg via ORAL
  Filled 2023-05-11 (×4): qty 1

## 2023-05-11 MED ORDER — AMLODIPINE BESYLATE 5 MG PO TABS
5.0000 mg | ORAL_TABLET | Freq: Every day | ORAL | Status: DC
  Administered 2023-05-11 – 2023-05-15 (×4): 5 mg via ORAL
  Filled 2023-05-11 (×5): qty 1

## 2023-05-11 NOTE — Progress Notes (Signed)
Progress Note   Patient: [name] [id number]:[id number] DOB: [birth year] DOA: 05/01/2023     9 DOS: the patient was seen and examined on 05/11/2023   Brief hospital course:  [name] is a 77 y.o. female with medical history significant for peripheral arterial disease, COPD on chronic 3-4 L home oxygen, hypertension, HFpEF(EF 45-50% 10/2022)) , remote breast cancer, colon cancer s/p resection, chronic back pain on buprenorphine patch and gastric ulcer hospitalized from 11/17 to 05/01/2023 at [facility] for perforated gastric ulcer with melena, requiring emergent surgery discharged the same day to SNF who presents to ED with shortness of breath.  Upon arriving to hospital, heart rate was 120, tachypnea 28, was placed on 6 L oxygen for significant hypoxemia.  Troponin was 24, BNP 340.  CT angiogram of the chest to rule out a PE, showed bilateral pleural effusion.  Patient was started on IV Lasix for CHF exacerbation. 12/12.  Volume status much better, changed to oral diuretics.     Principal Problem:   Acute on chronic heart failure with preserved ejection fraction (HFpEF) (HCC) Active Problems:   AKI (acute kidney injury) (HCC)   Acute on chronic respiratory failure with hypoxia (HCC)   COPD (chronic obstructive pulmonary disease) (HCC)   S/P exploratory laparotomy 04/08/23 for perforated gastric ulcer   HTN (hypertension)   PAD (peripheral artery disease) (HCC)   Hypokalemia   Anxiety and depression   Chronic back pain   Iron deficiency anemia   Chronic anemia   Pressure injury of skin       Assessment and Plan:   Acute on chronic heart failure with preserved ejection fraction (HFpEF) (HCC) Bilateral pleural effusion Acute on chronic respiratory failure with hypoxia Increased work of breathing tachypneic to 28 and tachycardic, BNP 340 and CTA showing bilateral pleural effusions(negative for PE) Patient initially required 6 L to maintain sats in the mid 90s up from  baseline at 3.5L. Currently back to her baseline home oxygen requirement. Echocardiogram 1 year ago showed ejection fraction 50 to 55%, repeat echocardiogram showed ejection ejection fraction 60 to 65% with grade 1 diastolic dysfunction. Patient was treated with IV Lasix, volume status much better today.   12/15. Condition continued to be stable, patient request to reduce the dose of diuretics due to frequent urination.  Decrease torsemide to 20 mg daily. 12/16.  Patient condition has stabilized, currently pending nursing home placement.  Patient has short of breath with minimal exertion, appeared to have poor prognosis, will obtain palliative care. 12/17.  Hold off diuretics because kidney function slightly worsened.  Still medically stable for discharge pending nursing placement. 12/18 Renal function is back to baseline.  Resume diuretic therapy      Acute kidney injury secondary to diuretics. Hypokalemia. Secondary to diuretic therapy Potassium has been supplemented Renal function is back to baseline     COPD (chronic obstructive pulmonary disease) (HCC) Appear at end-stage, palliative care consult obtained.    S/P exploratory laparotomy 04/08/23 for perforated gastric ulcer No acute issues suspected Hemoglobin stable Continue Protonix twice daily Continue Augmentin 875/125 twice daily, per [facility] note, she will be on Augmentin for a month.  Augmentin has been discontinued.    HTN (hypertension) Continue home amlodipine 5 mg daily, losartan 25 daily    PAD (peripheral artery disease) (HCC) left SFA CTO with failed attempt in 01/2019 for revascularization & right calcified iliac artery stenosis  Continue atorvastatin Followed at [facility]    Chronic iron deficiency anemia History of  melena 11/18 secondary to perforated gastric ulcer Patient has mild iron deficiency, B12 normal.  Continue oral iron therapy.    Chronic back pain Continue Butrans patch and Dilaudid PRN    Anxiety  and depression Restless leg syndrome/insomnia Xanax 0.25 daily as needed anxiety, ropinirole 1 mg 3 times daily, melatonin 3 mg nightly Patient slept better after giving Seroquel. Also added hydroxyzine as patient still has significant anxiety, could not increase dose of Xanax.    Pressure ulcers; POA Pressure Injury 05/02/23 Sacrum Stage 1 -  Intact skin with non-blanchable redness of a localized area usually over a bony prominence. (Active)  05/02/23 2039  Location: Sacrum  Location Orientation:   Staging: Stage 1 -  Intact skin with non-blanchable redness of a localized area usually over a bony prominence.  Wound Description (Comments):   Present on Admission:       Physical deconditioning PT recommended SNF which was declined by her insurance Peer-to-peer review done, discussed with medical director at home and community care transition who declines placement at this time because PT notes show that patient is doing better. Discussed recommendation with patient who plans to appeal              Subjective: No new complaints.  Physical Exam: Vitals:   05/11/23 0640 05/11/23 0734 05/11/23 1024 05/11/23 1151  BP: (!) 180/75 (!) 194/81  (!) 182/66  Pulse: 73 64  60  Resp: 18 16  18   Temp: 97.6 F (36.4 C) 98.2 F (36.8 C)  97.6 F (36.4 C)  TempSrc: Oral     SpO2: 100% 100%  100%  Weight:   56.1 kg   Height:       General exam: Appears calm and comfortable  Respiratory system: Bilateral air entry.  Scattered expiratory wheezes Cardiovascular system: S1 & S2 heard, RRR. No JVD, murmurs, rubs, gallops or clicks. No pedal edema. Gastrointestinal system: Abdomen is nondistended, soft and nontender. No organomegaly or masses felt. Normal bowel sounds heard. Central nervous system: Alert and oriented x3. No focal neurological deficits. Extremities: Symmetric 5 x 5 power. Skin: No rashes, lesions or ulcers Psychiatry: Judgement and insight appear normal. Mood & affect  appropriate.      Data Reviewed: Labs reviewed There are no new results to review at this time.  Family Communication: Plan of care discussed with patient in detail  Disposition: Status is: Inpatient Remains inpatient appropriate because: Awaiting nursing home placement.  Planned Discharge Destination: Skilled nursing facility    Time spent: 30 minutes  Author: Lucile Shutters, MD 05/11/2023 12:45 PM  For on call review [email or web address].

## 2023-05-11 NOTE — Plan of Care (Signed)
Pt's first night on the unit.  Will assess plan of care and update as needed.  Problem: Education: Goal: Knowledge of General Education information will improve Description: Including pain rating scale, medication(s)/side effects and non-pharmacologic comfort measures Outcome: Progressing   Problem: Health Behavior/Discharge Planning: Goal: Ability to manage health-related needs will improve Outcome: Progressing   Problem: Clinical Measurements: Goal: Ability to maintain clinical measurements within normal limits will improve Outcome: Progressing Goal: Will remain free from infection Outcome: Progressing Goal: Diagnostic test results will improve Outcome: Progressing Goal: Respiratory complications will improve Outcome: Progressing Goal: Cardiovascular complication will be avoided Outcome: Progressing   Problem: Activity: Goal: Risk for activity intolerance will decrease Outcome: Progressing   Problem: Nutrition: Goal: Adequate nutrition will be maintained Outcome: Progressing   Problem: Coping: Goal: Level of anxiety will decrease Outcome: Progressing   Problem: Elimination: Goal: Will not experience complications related to bowel motility Outcome: Progressing Goal: Will not experience complications related to urinary retention Outcome: Progressing   Problem: Pain Management: Goal: General experience of comfort will improve Outcome: Progressing   Problem: Safety: Goal: Ability to remain free from injury will improve Outcome: Progressing   Problem: Skin Integrity: Goal: Risk for impaired skin integrity will decrease Outcome: Progressing   Problem: Education: Goal: Ability to demonstrate management of disease process will improve Outcome: Progressing Goal: Ability to verbalize understanding of medication therapies will improve Outcome: Progressing Goal: Individualized Educational Video(s) Outcome: Progressing   Problem: Activity: Goal: Capacity to carry  out activities will improve Outcome: Progressing   Problem: Cardiac: Goal: Ability to achieve and maintain adequate cardiopulmonary perfusion will improve Outcome: Progressing

## 2023-05-11 NOTE — Progress Notes (Signed)
Occupational Therapy Treatment Patient Details Name: [name] MRN: [id number] DOB: 09-17-1945 Today's Date: 05/11/2023   History of present illness 77 y.o. female with medical history significant for peripheral arterial disease, COPD on chronic 3-4 L home oxygen, hypertension, HFpEF(EF 45-50% 10/2022)) , remote breast cancer, colon cancer s/p resection, chronic back pain on buprenorphine patch and gastric ulcer hospitalized from 11/17 to 05/01/2023 at [facility] for perforated gastric ulcer with melena, requiring emergent surgery discharged the same day to SNF who presents to ED with shortness of breath. Patient was started on IV Lasix for CHF exacerbation.   OT comments  Pt seen for OT tx. Pt denies SOB, continues to have chronic back pain, worsening with mobility. Pt required increased time/effort to complete bed mobility. No direct assist required for donning socks while seated EOB. Pt stood requiring CGA with RW from EOB and ambulated to the bathroom requiring CGA for stability/balance. Pt notes she earlier went to the bathroom by herself and without her O2 on. SpO2 99% on 4L at start of session. Removed to go to the bathroom and pt noted to drop to 79-80% on RA once seated on BSC but improving quickly up to 88% at rest on RA. Once returned to bed, reapplied and improved to 96%. Pt educated on need for supplemental O2 at all times right now. Pt verbalized understanding. Continues to benefit from skilled OT Services. Continues to endorse SOB with limited exertion and denies significant assist available at home.       If plan is discharge home, recommend the following:  A little help with walking and/or transfers;A lot of help with bathing/dressing/bathroom;Assistance with cooking/housework;Assist for transportation;Help with stairs or ramp for entrance   Equipment Recommendations  BSC/3in1;Other (comment) (2WW)    Recommendations for Other Services      Precautions /  Restrictions Precautions Precautions: Fall Precaution Comments: recent ex lap Restrictions Weight Bearing Restrictions Per Provider Order: No       Mobility Bed Mobility Overal bed mobility: Needs Assistance Bed Mobility: Sit to Supine, Supine to Sit     Supine to sit: HOB elevated, Supervision Sit to supine: Supervision, HOB elevated        Transfers Overall transfer level: Needs assistance Equipment used: Rolling walker (2 wheels)   Sit to Stand: Supervision, Contact guard assist                 Balance Overall balance assessment: Needs assistance Sitting-balance support: No upper extremity supported, Feet supported Sitting balance-Leahy Scale: Good     Standing balance support: Bilateral upper extremity supported, During functional activity Standing balance-Leahy Scale: Fair Standing balance comment: initial LOB when standing from [facility] over toilet and bracing her BLE against the BSC frame to correct                           ADL either performed or assessed with clinical judgement   ADL Overall ADL's : Needs assistance/impaired                     Lower Body Dressing: Sitting/lateral leans;Supervision/safety Lower Body Dressing Details (indicate cue type and reason): Pt donned socks while seated EOB without direct assist to complete, increased time and rest afterwards to focus on breathing Toilet Transfer: Contact guard assist;BSC/3in1;Regular Toilet;Rolling walker (2 wheels) Toilet Transfer Details (indicate cue type and reason): BSC over the toilet Toileting- Clothing Manipulation and Hygiene: Contact guard assist;Sit to/from stand  Functional mobility during ADLs: Contact guard assist;Rolling walker (2 wheels)      Extremity/Trunk Assessment              Vision       Perception     Praxis      Cognition Arousal: Alert Behavior During Therapy: WFL for tasks assessed/performed Overall Cognitive Status: Within  Functional Limits for tasks assessed                                 General Comments: decreased safety awareness and awareness of her deficits in terms of O2 needs        Exercises      Shoulder Instructions       General Comments Pt notes she earlier went to the bathroom by herself and without her O2 on. SpO2 99% on 4L at start of session. Removed to go to the bathroom and pt noted to drop to 79-80% on RA once seated on BSC but improving quickly up to 88% at rest on RA. Once returned to bed, reapplied and improved to 96%. Pt educated on need for supplemental O2 at all times right now.    Pertinent Vitals/ Pain       Pain Assessment Pain Assessment: Faces Faces Pain Scale: Hurts little more Pain Location: LBP Pain Descriptors / Indicators: Aching, Grimacing, Guarding Pain Intervention(s): Monitored during session, Limited activity within patient's tolerance, Premedicated before session, Repositioned  Home Living                                          Prior Functioning/Environment              Frequency  Min 1X/week        Progress Toward Goals  OT Goals(current goals can now be found in the care plan section)  Progress towards OT goals: Progressing toward goals  Acute Rehab OT Goals Patient Stated Goal: have less pain OT Goal Formulation: With patient Time For Goal Achievement: 05/17/23 Potential to Achieve Goals: Good  Plan      Co-evaluation                 AM-PAC OT "6 Clicks" Daily Activity     Outcome Measure   Help from another person eating meals?: None Help from another person taking care of personal grooming?: A Little Help from another person toileting, which includes using toliet, bedpan, or urinal?: A Little Help from another person bathing (including washing, rinsing, drying)?: A Little Help from another person to put on and taking off regular upper body clothing?: A Little Help from another person  to put on and taking off regular lower body clothing?: A Little 6 Click Score: 19    End of Session Equipment Utilized During Treatment: Oxygen;Rolling walker (2 wheels)  OT Visit Diagnosis: Other abnormalities of gait and mobility (R26.89);Muscle weakness (generalized) (M62.81);Pain Pain - Right/Left:  (back)   Activity Tolerance Patient tolerated treatment well   Patient Left in bed;with call bell/phone within reach;with bed alarm set   Nurse Communication          Time: 9147-8295 OT Time Calculation (min): 24 min  Charges: OT General Charges $OT Visit: 1 Visit OT Treatments $Self Care/Home Management : 23-37 mins  Arman Filter., MPH, MS, OTR/L ascom [phone] 05/11/23, 1:11 PM

## 2023-05-11 NOTE — Care Management Important Message (Signed)
Important Message  Patient Details  Name: [place] S Weisenburger MRN: [id number] Date of Birth: 29-Oct-1945   Important Message Given:  Yes - Medicare IM     Bernadette Hoit 05/11/2023, 9:55 AM

## 2023-05-11 NOTE — Progress Notes (Signed)
Pt refused q4 vitals, stating that she did not want to be bothered once she was given her nighttime medications.  She did not want any staff to come in the room, including lab as well.  Pt stated that she would call out when she was awake or when she needed anything.

## 2023-05-12 ENCOUNTER — Inpatient Hospital Stay: Payer: 59

## 2023-05-12 DIAGNOSIS — I5033 Acute on chronic diastolic (congestive) heart failure: Secondary | ICD-10-CM | POA: Diagnosis not present

## 2023-05-12 LAB — BASIC METABOLIC PANEL
Anion gap: 9 (ref 5–15)
BUN: 29 mg/dL — ABNORMAL HIGH (ref 8–23)
CO2: 32 mmol/L (ref 22–32)
Calcium: 10.2 mg/dL (ref 8.9–10.3)
Chloride: 99 mmol/L (ref 98–111)
Creatinine, Ser: 1.13 mg/dL — ABNORMAL HIGH (ref 0.44–1.00)
GFR, Estimated: 50 mL/min — ABNORMAL LOW (ref >60)
Glucose, Bld: 94 mg/dL (ref 70–99)
Potassium: 3.7 mmol/L (ref 3.5–5.1)
Sodium: 140 mmol/L (ref 135–145)

## 2023-05-12 MED ORDER — ALPRAZOLAM 0.25 MG PO TABS: 0.2500 mg | ORAL_TABLET | Freq: Once | ORAL | Status: DC

## 2023-05-12 MED ORDER — HYDROXYZINE HCL 50 MG PO TABS
25.0000 mg | ORAL_TABLET | Freq: Three times a day (TID) | ORAL | Status: DC | PRN
  Administered 2023-05-12 – 2023-05-13 (×2): 25 mg via ORAL
  Filled 2023-05-12 (×2): qty 1

## 2023-05-12 MED ORDER — ALPRAZOLAM 0.25 MG PO TABS
0.2500 mg | ORAL_TABLET | Freq: Once | ORAL | Status: AC
  Administered 2023-05-12: 0.25 mg via ORAL
  Filled 2023-05-12: qty 1

## 2023-05-12 NOTE — Plan of Care (Signed)
  Problem: Education: Goal: Knowledge of General Education information will improve Description Including pain rating scale, medication(s)/side effects and non-pharmacologic comfort measures Outcome: Progressing   

## 2023-05-12 NOTE — Progress Notes (Signed)
Progress Note   Patient: [name] ZOX:[id number] DOB: 05-17-1946 DOA: 05/01/2023     10 DOS: the patient was seen and examined on 05/12/2023   Brief hospital course:  [name] is a 77 y.o. female with medical history significant for peripheral arterial disease, COPD on chronic 3-4 L home oxygen, hypertension, HFpEF(EF 45-50% 10/2022)) , remote breast cancer, colon cancer s/p resection, chronic back pain on buprenorphine patch and gastric ulcer hospitalized from 11/17 to 05/01/2023 at [facility] for perforated gastric ulcer with melena, requiring emergent surgery discharged the same day to SNF who presents to ED with shortness of breath.  Upon arriving to hospital, heart rate was 120, tachypnea 28, was placed on 6 L oxygen for significant hypoxemia.  Troponin was 24, BNP 340.  CT angiogram of the chest to rule out a PE, showed bilateral pleural effusion.  Patient was started on IV Lasix for CHF exacerbation. 12/12.  Volume status much better, changed to oral diuretics.     Principal Problem:   Acute on chronic heart failure with preserved ejection fraction (HFpEF) (HCC) Active Problems:   AKI (acute kidney injury) (HCC)   Acute on chronic respiratory failure with hypoxia (HCC)   COPD (chronic obstructive pulmonary disease) (HCC)   S/P exploratory laparotomy 04/08/23 for perforated gastric ulcer   HTN (hypertension)   PAD (peripheral artery disease) (HCC)   Hypokalemia   Anxiety and depression   Chronic back pain   Iron deficiency anemia   Chronic anemia   Pressure injury of skin      Assessment and Plan:  Acute on chronic heart failure with preserved ejection fraction (HFpEF) (HCC) Bilateral pleural effusion Acute on chronic respiratory failure with hypoxia Increased work of breathing tachypneic to 28 and tachycardic, BNP 340 and CTA showing bilateral pleural effusions(negative for PE) on admission Patient initially required 6 L to maintain sats in the mid 90s  up from baseline at 3.5L. Currently back to her baseline home oxygen requirement but episodes of desaturation with ambulation.  May require higher oxygen levels with exertion. Echocardiogram 1 year ago showed ejection fraction 50 to 55%, repeat echocardiogram showed ejection ejection fraction 60 to 65% with grade 1 diastolic dysfunction. Patient was treated with IV Lasix, volume status much better today.   12/15. Condition continued to be stable, patient request to reduce the dose of diuretics due to frequent urination.  Decrease torsemide to 20 mg daily. 12/16.  Patient condition has stabilized, currently pending nursing home placement.  Patient has short of breath with minimal exertion, appeared to have poor prognosis, will obtain palliative care. 12/17.  Hold off diuretics because kidney function slightly worsened.  Still medically stable for discharge pending nursing placement. 12/18 Renal function is back to baseline.  Resume diuretic therapy       Acute kidney injury secondary to diuretics. Hypokalemia. Secondary to diuretic therapy Potassium has been supplemented Renal function is back to baseline     COPD (chronic obstructive pulmonary disease) (HCC) Appear at end-stage, palliative care consult obtained.     S/P exploratory laparotomy 04/08/23 for perforated gastric ulcer No acute issues suspected Hemoglobin stable Continue Protonix twice daily Continue Augmentin 875/125 twice daily, per [facility] note, she will be on Augmentin for a month.  Augmentin has been discontinued since she completed a month of therapy     HTN (hypertension) Continue home amlodipine 5 mg daily, losartan 25 daily     PAD (peripheral artery disease) (HCC) left SFA CTO with failed attempt  in 01/2019 for revascularization & right calcified iliac artery stenosis  Continue atorvastatin Followed at [facility]     Chronic iron deficiency anemia History of melena 11/18 secondary to perforated gastric ulcer Patient  has mild iron deficiency, B12 normal.  Continue oral iron therapy.     Chronic back pain Continue Butrans patch and Dilaudid PRN     Anxiety and depression Restless leg syndrome/insomnia Xanax 0.25 daily as needed anxiety, ropinirole 1 mg 3 times daily, melatonin 3 mg nightly Patient slept better after giving Seroquel. Also added hydroxyzine as patient still has significant anxiety, could not increase dose of Xanax.    Pressure ulcers; POA Pressure Injury 05/02/23 Sacrum Stage 1 -  Intact skin with non-blanchable redness of a localized area usually over a bony prominence. (Active)  05/02/23 2039  Location: Sacrum  Location Orientation:   Staging: Stage 1 -  Intact skin with non-blanchable redness of a localized area usually over a bony prominence.  Wound Description (Comments):   Present on Admission:       Physical deconditioning PT recommended SNF which was declined by her insurance Peer-to-peer review done, discussed with medical director at home and community care transition who declines placement at this time because PT notes show that patient is doing better. Discussed recommendation with patient who has appealed the process.               Subjective: Short of breath with minimal exertion.  At rest on her 4 L pulse oximetry is 91 - 96% with exertion she drops in the low 80s.  Physical Exam: Vitals:   05/11/23 1151 05/11/23 1538 05/11/23 2110 05/12/23 0731  BP: (!) 182/66 (!) 129/45 (!) 153/74 (!) 158/61  Pulse: 60 70 74 (!) 58  Resp: 18 16 18 18   Temp: 97.6 F (36.4 C) 98.4 F (36.9 C) 97.8 F (36.6 C) 97.7 F (36.5 C)  TempSrc:      SpO2: 100% 100% 100% 100%  Weight:      Height:       General exam: Appears calm and comfortable  Respiratory system: Bilateral air entry.  Scattered expiratory wheezes Cardiovascular system: S1 & S2 heard, RRR. No JVD, murmurs, rubs, gallops or clicks. No pedal edema. Gastrointestinal system: Abdomen is nondistended, soft  and nontender. No organomegaly or masses felt. Normal bowel sounds heard. Central nervous system: Alert and oriented x3. No focal neurological deficits. Extremities: Symmetric 5 x 5 power. Skin: No rashes, lesions or ulcers Psychiatry: Judgement and insight appear normal. Mood & affect appropriate.       Data Reviewed: Labs reviewed.  BUN 29, creatinine 1.13 There are no new results to review at this time.  Family Communication: Plan of care discussed with patient in detail.  She verbalizes understanding and agrees with the plan  Disposition: Status is: Inpatient Remains inpatient appropriate because: Awaiting discharge to SNF  Planned Discharge Destination: Skilled nursing facility    Time spent: 34 minutes  Author: Lucile Shutters, MD 05/12/2023 12:23 PM  For on call review [email or web address].

## 2023-05-12 NOTE — Progress Notes (Signed)
Patient on morning diuretics. Placed bedside commode within reach due to urgency to void. Offered to put on sock, patients refused and stated that she will put them on when she is ready to get up to commode. Personal belongings positioned so it will be easy for patient to move. O2 tubing length checked to ensure that patient can reach to bedside commode.

## 2023-05-12 NOTE — Progress Notes (Signed)
Physical Therapy Treatment Patient Details Name: [name] MRN: [id number] DOB: [birth year] Today's Date: 05/12/2023   History of Present Illness 77 y.o. female with medical history significant for peripheral arterial disease, COPD on chronic 3-4 L home oxygen, hypertension, HFpEF(EF 45-50% 10/2022)) , remote breast cancer, colon cancer s/p resection, chronic back pain on buprenorphine patch and gastric ulcer hospitalized from 11/17 to 05/01/2023 at [facility] for perforated gastric ulcer with melena, requiring emergent surgery discharged the same day to SNF who presents to ED with shortness of breath. Patient was started on IV Lasix for CHF exacerbation.    PT Comments  Pt is progressing with greater activity tolerance (ambulated 150' with RW, 4L O2, SPO2 91% at supervision level). Pt continues to need to cues for awareness to rest appropriately; pt up in room when this author came for PT session. Pt stated that she was tired because she had been up cleaning for 20 min. She couldn't "keep sitting here". Pt sat down in recliner and SPO2 77% on 4 L O2 took about 5 min of seated rest for pt to have SPO2 96%. Explained to pt the importance of taking seated rest breaks to maintain SPO2 >90%; pt verbalized understanding, RN made aware. Continued PT will assist pt towards greater LE strengthening and dynamic standing balance for increased safety and independence for mobility.   If plan is discharge home, recommend the following: A little help with walking and/or transfers;A little help with bathing/dressing/bathroom;Assistance with cooking/housework;Direct supervision/assist for medications management;Direct supervision/assist for financial management;Assist for transportation;Help with stairs or ramp for entrance   Can travel by private vehicle     Yes  Equipment Recommendations   (Defer to next level of care)    Recommendations for Other Services       Precautions / Restrictions  Precautions Precautions: Fall Restrictions Weight Bearing Restrictions Per Provider Order: No     Mobility  Bed Mobility               General bed mobility comments: NT Pt was in recliner pre/post session    Transfers Overall transfer level: Modified independent Equipment used: Rolling walker (2 wheels) Transfers: Sit to/from Stand, Bed to chair/wheelchair/BSC Sit to Stand: Modified independent (Device/Increase time)   Step pivot transfers: Modified independent (Device/Increase time)       General transfer comment: Pt demonstrates stability with transfers.    Ambulation/Gait Ambulation/Gait assistance: Supervision Gait Distance (Feet): 150 Feet Assistive device: Rolling walker (2 wheels) Gait Pattern/deviations: Step-through pattern, Trunk flexed, Decreased stride length Gait velocity: decreased     General Gait Details: Pt on 4 L O2 during gait, after returning to room, SPO2 88% but rose back quickly to 91% after seated rest.   Stairs             Wheelchair Mobility     Tilt Bed    Modified Rankin (Stroke Patients Only)       Balance Overall balance assessment: Needs assistance Sitting-balance support: No upper extremity supported, Feet supported Sitting balance-Leahy Scale: Good     Standing balance support: During functional activity, Single extremity supported Standing balance-Leahy Scale: Fair Standing balance comment: Pt requires intermittent UE support for dynamic standing balance and gait.  Continue to recommend RW for stability with  gait.                            Cognition Arousal: Alert   Overall Cognitive Status: Within Functional Limits  for tasks assessed                                 General Comments: decreased safety awareness and awareness of her deficits in terms of O2 needs        Exercises      General Comments General comments (skin integrity, edema, etc.): Pt up in room when this  author came for PT session.  Pt stated that she was tired because she had been up cleaning for 20 min. She couldn't "keep sitting here".  Pt sat down in recliner and SPO2 77% on 4 L O2 took about 5 min of seated rest for pt to have SPO2 96%.  Explained to pt the importance of taking seated rest breaks to maintain SPO2 >90%; pt verbalized understanding, RN made aware.      Pertinent Vitals/Pain Pain Assessment Pain Assessment: 0-10 Pain Score: 9  Pain Location: LBP Pain Descriptors / Indicators: Aching, Grimacing, Guarding Pain Intervention(s): Patient requesting pain meds-RN notified    Home Living                          Prior Function            PT Goals (current goals can now be found in the care plan section) Acute Rehab PT Goals Patient Stated Goal: regain independence PT Goal Formulation: With patient Time For Goal Achievement: 05/16/23 Potential to Achieve Goals: Good Progress towards PT goals: Progressing toward goals    Frequency    Min 1X/week      PT Plan      Co-evaluation              AM-PAC PT "6 Clicks" Mobility   Outcome Measure  Help needed turning from your back to your side while in a flat bed without using bedrails?: A Little Help needed moving from lying on your back to sitting on the side of a flat bed without using bedrails?: A Little Help needed moving to and from a bed to a chair (including a wheelchair)?: None Help needed standing up from a chair using your arms (e.g., wheelchair or bedside chair)?: None Help needed to walk in hospital room?: A Little Help needed climbing 3-5 steps with a railing? : A Little 6 Click Score: 20    End of Session Equipment Utilized During Treatment: Oxygen;Gait belt Activity Tolerance: Patient tolerated treatment well;Patient limited by fatigue Patient left: in chair;with call bell/phone within reach;with chair alarm set;with nursing/sitter in room Nurse Communication: Mobility status PT  Visit Diagnosis: Muscle weakness (generalized) (M62.81);Unsteadiness on feet (R26.81)     Time: 5784-6962 PT Time Calculation (min) (ACUTE ONLY): 25 min  Charges:    $Gait Training: 8-22 mins $Therapeutic Activity: 8-22 mins PT General Charges $$ ACUTE PT VISIT: 1 Visit                     Hortencia Conradi, PTA  05/12/23, 1:15 PM

## 2023-05-12 NOTE — Progress Notes (Addendum)
Patient was offered xanax this morning during assessment at around 0900 as she had stated that she was feeling anxious. Patient refused and stated the take the xanax at bedtime. Patient assisted to recliner and BSC was within reach due to morning dieretics. Patient stated that she wanted to "finish peeing" and she how she was feeling. At around 1200 patient remains anxious and restless with o2 sat found to be at 79%. Educated on resting in between activity to prevent shortness of breath. Informed Dr Joylene Igo regarding patient's restlessness and anxiety and that patient did want to take her daily dose yet. Dr Joylene Igo recommended offering patient visteral. Patient agreeable to try visteral to help with anxiety. After one hour patient stated that she was a "nervous ball" and can not wait until bedtime for xanax, offered dose now, patient agreeable. Med administered, will continue to monitor for effects.   1700 patient stated that xanax was effective and "took the edge off". MD made aware of effectiveness and patient's request to have xanax available for bedtime. Xanax for HS and chest xray ordered.

## 2023-05-13 DIAGNOSIS — I5033 Acute on chronic diastolic (congestive) heart failure: Secondary | ICD-10-CM | POA: Diagnosis not present

## 2023-05-13 DIAGNOSIS — R001 Bradycardia, unspecified: Secondary | ICD-10-CM

## 2023-05-13 LAB — BASIC METABOLIC PANEL
Anion gap: 10 (ref 5–15)
BUN: 34 mg/dL — ABNORMAL HIGH (ref 8–23)
CO2: 30 mmol/L (ref 22–32)
Calcium: 10.2 mg/dL (ref 8.9–10.3)
Chloride: 97 mmol/L — ABNORMAL LOW (ref 98–111)
Creatinine, Ser: 1.07 mg/dL — ABNORMAL HIGH (ref 0.44–1.00)
GFR, Estimated: 54 mL/min — ABNORMAL LOW (ref >60)
Glucose, Bld: 93 mg/dL (ref 70–99)
Potassium: 4.3 mmol/L (ref 3.5–5.1)
Sodium: 137 mmol/L (ref 135–145)

## 2023-05-13 LAB — MAGNESIUM: Magnesium: 2 mg/dL (ref 1.7–2.4)

## 2023-05-13 MED ORDER — FUROSEMIDE 10 MG/ML IJ SOLN
40.0000 mg | Freq: Every day | INTRAMUSCULAR | Status: DC
  Filled 2023-05-13: qty 4

## 2023-05-13 MED ORDER — TORSEMIDE 20 MG PO TABS
40.0000 mg | ORAL_TABLET | Freq: Every day | ORAL | Status: AC
  Administered 2023-05-13 – 2023-05-14 (×2): 40 mg via ORAL
  Filled 2023-05-13 (×2): qty 2

## 2023-05-13 NOTE — Progress Notes (Signed)
Patient tired and wanted to sleep. Informed that diuretic was scheduled to be given and that I did not want to delay administration causing patient to be up in the evening/night. Due to fall risk discussed placing purewick with charge rn at this time to reduce risk. Pt agreeable. Scheduled diuretic given purwick placed. Patient instructed on how purewick works. Call light within reach and personal belongings within reach.

## 2023-05-13 NOTE — Progress Notes (Signed)
Patient stated she gets in and out of bed "a lot" and didn't want bed alarm on.

## 2023-05-13 NOTE — Progress Notes (Signed)
Patient needing IV access for IV lasix. Pt refusing iv placement at this time. Dr. Joylene Igo made aware. Provider to change medication to oral

## 2023-05-13 NOTE — Progress Notes (Signed)
Progress Note   Patient: [place] S Calkin MVH:[id number] DOB: 1946-03-17 DOA: 05/01/2023     11 DOS: the patient was seen and examined on 05/13/2023   Brief hospital course:  [place] S Ferrington is a 77 y.o. female with medical history significant for peripheral arterial disease, COPD on chronic 3-4 L home oxygen, hypertension, HFpEF(EF 45-50% 10/2022)) , remote breast cancer, colon cancer s/p resection, chronic back pain on buprenorphine patch and gastric ulcer hospitalized from 11/17 to 05/01/2023 at [facility] for perforated gastric ulcer with melena, requiring emergent surgery discharged the same day to SNF who presents to ED with shortness of breath.  Upon arriving to hospital, heart rate was 120, tachypnea 28, was placed on 6 L oxygen for significant hypoxemia.  Troponin was 24, BNP 340.  CT angiogram of the chest to rule out a PE, showed bilateral pleural effusion.  Patient was started on IV Lasix for CHF exacerbation. 12/12.  Volume status much better, changed to oral diuretics.     Principal Problem:   Acute on chronic heart failure with preserved ejection fraction (HFpEF) (HCC) Active Problems:   AKI (acute kidney injury) (HCC)   Acute on chronic respiratory failure with hypoxia (HCC)   COPD (chronic obstructive pulmonary disease) (HCC)   S/P exploratory laparotomy 04/08/23 for perforated gastric ulcer   HTN (hypertension)   PAD (peripheral artery disease) (HCC)   Hypokalemia   Anxiety and depression   Chronic back pain   Iron deficiency anemia   Chronic anemia   Pressure injury of skin       Assessment and Plan:  Acute on chronic heart failure with preserved ejection fraction (HFpEF) (HCC) Bilateral pleural effusion Acute on chronic respiratory failure with hypoxia Increased work of breathing tachypneic to 28 and tachycardic, BNP 340 and CTA showing bilateral pleural effusions(negative for PE) on admission Patient initially required 6 L to maintain sats in the mid  90s up from baseline at 3.5L. Currently back to her baseline home oxygen requirement but episodes of desaturation with ambulation.  May require higher oxygen levels with exertion. Echocardiogram 1 year ago showed ejection fraction 50 to 55%, repeat echocardiogram showed ejection ejection fraction 60 to 65% with grade 1 diastolic dysfunction. Patient was treated with IV Lasix, volume status much better today.   12/15. Condition continued to be stable, patient request to reduce the dose of diuretics due to frequent urination.  Decrease torsemide to 20 mg daily. 12/16.  Patient condition has stabilized, currently pending nursing home placement.  Patient has short of breath with minimal exertion, appeared to have poor prognosis, will obtain palliative care. 12/17.  Hold off diuretics because kidney function slightly worsened.  Still medically stable for discharge pending nursing placement. 12/18 Renal function is back to baseline.  Resume diuretic therapy 12/22 noted to have episodes of desaturation with exertion on 4 L, pulse oximetry drops in the low 80s and improves at rest.  Chest x-ray showed mild central vascular congestion.  Cardiac enlargement.  Ordered 2 doses of Lasix 40 mg IV but patient would rather take oral as she does not have an IV in at this time and does not want to be stuck.       Acute kidney injury secondary to diuretics. Hypokalemia. Secondary to diuretic therapy Potassium has been supplemented Renal function is back to baseline     COPD (chronic obstructive pulmonary disease) (HCC) Appears to be at end-stage, palliative care consult obtained.     S/P exploratory laparotomy 04/08/23 for perforated  gastric ulcer No acute issues suspected Hemoglobin stable Continue Protonix twice daily Patient has completed a course of Augmentin which was prescribed at [facility] upon discharge.      HTN (hypertension) Continue home amlodipine 5 mg daily, losartan 25 daily     PAD  (peripheral artery disease) (HCC) left SFA CTO with failed attempt in 01/2019 for revascularization & right calcified iliac artery stenosis  Continue atorvastatin Followed at [facility]     Chronic iron deficiency anemia History of melena 11/18 secondary to perforated gastric ulcer Patient has mild iron deficiency, B12 normal.  Continue oral iron therapy.     Chronic back pain Continue Butrans patch and Dilaudid PRN     Anxiety and depression Restless leg syndrome/insomnia Xanax 0.25 daily as needed anxiety, ropinirole 1 mg 3 times daily, melatonin 3 mg nightly Patient slept better after giving Seroquel. Also added hydroxyzine as patient still has significant anxiety, could not increase dose of Xanax.    Pressure ulcers; POA Pressure Injury 05/02/23 Sacrum Stage 1 -  Intact skin with non-blanchable redness of a localized area usually over a bony prominence. (Active)  05/02/23 2039  Location: Sacrum  Location Orientation:   Staging: Stage 1 -  Intact skin with non-blanchable redness of a localized area usually over a bony prominence.  Wound Description (Comments):   Present on Admission:       Physical deconditioning PT recommended SNF which was declined by her insurance Peer-to-peer review done, discussed with medical director at home and community care transition who declines placement at this time because PT notes show that patient is doing better. Discussed recommendation with patient who has appealed the process.                 Subjective: Patient is seen and examined at the bedside.  Continues to complain of exertional shortness of breath  Physical Exam: Vitals:   05/13/23 0500 05/13/23 0558 05/13/23 0632 05/13/23 0720  BP:  (!) 139/53 (!) 129/43 (!) 140/63  Pulse:  (!) 55 (!) 58 (!) 55  Resp:  16  18  Temp:  97.6 F (36.4 C)  97.8 F (36.6 C)  TempSrc:  Oral    SpO2:  100% 100% 98%  Weight: 59 kg     Height:       General exam: Appears calm and comfortable   Respiratory system: Bilateral air entry.  Scattered expiratory wheezes Cardiovascular system: S1 & S2 heard, RRR. No JVD, murmurs, rubs, gallops or clicks. No pedal edema. Gastrointestinal system: Abdomen is nondistended, soft and nontender. No organomegaly or masses felt. Normal bowel sounds heard. Central nervous system: Alert and oriented x3. No focal neurological deficits. Extremities: Symmetric 5 x 5 power. Skin: No rashes, lesions or ulcers Psychiatry: Judgement and insight appear normal. Mood & affect appropriate.    Data Reviewed: Labs reviewed. There are no new results to review at this time. Family Communication: Plan of care discussed with patient in detail.  She verbalizes understanding and agrees with the plan  Disposition: Status is: Inpatient Remains inpatient appropriate because: Awaiting discharge to skilled nursing facility  Planned Discharge Destination: Skilled nursing facility    Time spent: 33 minutes  Author: Lucile Shutters, MD 05/13/2023 11:34 AM  For on call review [email or web address].

## 2023-05-13 NOTE — Progress Notes (Signed)
Notified by CCMD that patient was in bigeminy.  VS taken and Manuela Schwartz AAP notified.

## 2023-05-13 NOTE — Plan of Care (Signed)
  Problem: Education: Goal: Knowledge of General Education information will improve Description Including pain rating scale, medication(s)/side effects and non-pharmacologic comfort measures Outcome: Progressing   

## 2023-05-14 ENCOUNTER — Inpatient Hospital Stay: Payer: 59

## 2023-05-14 DIAGNOSIS — I5033 Acute on chronic diastolic (congestive) heart failure: Secondary | ICD-10-CM | POA: Diagnosis not present

## 2023-05-14 MED ORDER — ORAL CARE MOUTH RINSE: 15.0000 mL | OROMUCOSAL | Status: DC | PRN

## 2023-05-14 NOTE — Plan of Care (Signed)
Patient without distress tonight. Slept well, no complaints voiced. Will continue to monitor.   Problem: Education: Goal: Knowledge of General Education information will improve Description: Including pain rating scale, medication(s)/side effects and non-pharmacologic comfort measures Outcome: Progressing   Problem: Health Behavior/Discharge Planning: Goal: Ability to manage health-related needs will improve Outcome: Progressing   Problem: Clinical Measurements: Goal: Ability to maintain clinical measurements within normal limits will improve Outcome: Progressing Goal: Will remain free from infection Outcome: Progressing Goal: Diagnostic test results will improve Outcome: Progressing Goal: Respiratory complications will improve Outcome: Progressing Goal: Cardiovascular complication will be avoided Outcome: Progressing   Problem: Activity: Goal: Risk for activity intolerance will decrease Outcome: Progressing   Problem: Nutrition: Goal: Adequate nutrition will be maintained Outcome: Progressing   Problem: Coping: Goal: Level of anxiety will decrease Outcome: Progressing   Problem: Elimination: Goal: Will not experience complications related to bowel motility Outcome: Progressing Goal: Will not experience complications related to urinary retention Outcome: Progressing   Problem: Pain Management: Goal: General experience of comfort will improve Outcome: Progressing   Problem: Safety: Goal: Ability to remain free from injury will improve Outcome: Progressing   Problem: Skin Integrity: Goal: Risk for impaired skin integrity will decrease Outcome: Progressing   Problem: Education: Goal: Ability to demonstrate management of disease process will improve Outcome: Progressing Goal: Ability to verbalize understanding of medication therapies will improve Outcome: Progressing Goal: Individualized Educational Video(s) Outcome: Progressing   Problem: Activity: Goal:  Capacity to carry out activities will improve Outcome: Progressing   Problem: Cardiac: Goal: Ability to achieve and maintain adequate cardiopulmonary perfusion will improve Outcome: Progressing

## 2023-05-14 NOTE — Progress Notes (Signed)
Progress Note   Patient: [name] [id number]:[id number] DOB: 04-22-1946 DOA: 05/01/2023     12 DOS: the patient was seen and examined on 05/14/2023   Brief hospital course:  [name] is a 77 y.o. female with medical history significant for peripheral arterial disease, COPD on chronic 3-4 L home oxygen, hypertension, HFpEF(EF 45-50% 10/2022)) , remote breast cancer, colon cancer s/p resection, chronic back pain on buprenorphine patch and gastric ulcer hospitalized from 11/17 to 05/01/2023 at [facility] for perforated gastric ulcer with melena, requiring emergent surgery discharged the same day to SNF who presents to ED with shortness of breath.  Upon arriving to hospital, heart rate was 120, tachypnea 28, was placed on 6 L oxygen for significant hypoxemia.  Troponin was 24, BNP 340.  CT angiogram of the chest to rule out a PE, showed bilateral pleural effusion.  Patient was started on IV Lasix for CHF exacerbation. 12/12.  Volume status much better, changed to oral diuretics.     Principal Problem:   Acute on chronic heart failure with preserved ejection fraction (HFpEF) (HCC) Active Problems:   AKI (acute kidney injury) (HCC)   Acute on chronic respiratory failure with hypoxia (HCC)   COPD (chronic obstructive pulmonary disease) (HCC)   S/P exploratory laparotomy 04/08/23 for perforated gastric ulcer   HTN (hypertension)   PAD (peripheral artery disease) (HCC)   Hypokalemia   Anxiety and depression   Chronic back pain   Iron deficiency anemia   Chronic anemia   Pressure injury of skin     Assessment and Plan:   Acute on chronic heart failure with preserved ejection fraction (HFpEF) (HCC) Bilateral pleural effusion Acute on chronic respiratory failure with hypoxia Increased work of breathing tachypneic to 28 and tachycardic, BNP 340 and CTA showing bilateral pleural effusions(negative for PE) on admission Patient initially required 6 L to maintain sats in the mid 90s  up from baseline at 3.5L. Currently back to her baseline home oxygen requirement but episodes of desaturation with ambulation.  May require higher oxygen levels with exertion. Echocardiogram 1 year ago showed ejection fraction 50 to 55%, repeat echocardiogram showed ejection ejection fraction 60 to 65% with grade 1 diastolic dysfunction. Patient was treated with IV Lasix, volume status much better today.   12/15. Condition continued to be stable, patient request to reduce the dose of diuretics due to frequent urination.  Decrease torsemide to 20 mg daily. 12/16.  Patient condition has stabilized, currently pending nursing home placement.  Patient has short of breath with minimal exertion, appeared to have poor prognosis, will obtain palliative care. 12/17.  Hold off diuretics because kidney function slightly worsened.  Still medically stable for discharge pending nursing placement. 12/18 Renal function is back to baseline.  Resume diuretic therapy 12/22 noted to have episodes of desaturation with exertion on 4 L, pulse oximetry drops in the low 80s and improves at rest.  Chest x-ray showed mild central vascular congestion.  Cardiac enlargement.  Ordered 2 doses of Lasix 40 mg IV but patient would rather take oral as she does not have an IV in at this time and does not want to be stuck.       Acute kidney injury secondary to diuretics. Hypokalemia. Secondary to diuretic therapy Potassium has been supplemented Renal function is back to baseline     COPD (chronic obstructive pulmonary disease) (HCC) Appears to be at end-stage, palliative care consult obtained.     S/P exploratory laparotomy 04/08/23 for perforated gastric  ulcer No acute issues suspected Hemoglobin stable Continue Protonix twice daily Patient has completed a course of Augmentin which was prescribed at [facility] upon discharge.       HTN (hypertension) Continue home amlodipine 5 mg daily, losartan 25 daily     PAD (peripheral  artery disease) (HCC) left SFA CTO with failed attempt in 01/2019 for revascularization & right calcified iliac artery stenosis  Continue atorvastatin Followed at [facility]     Chronic iron deficiency anemia History of melena 11/18 secondary to perforated gastric ulcer Patient has mild iron deficiency, B12 normal.  Continue oral iron therapy.     Chronic back pain Continue Butrans patch and Dilaudid PRN     Anxiety and depression Restless leg syndrome/insomnia Xanax 0.25 daily as needed anxiety, ropinirole 1 mg 3 times daily, melatonin 3 mg nightly Patient slept better after giving Seroquel. Also added hydroxyzine as patient still has significant anxiety, could not increase dose of Xanax.    Pressure ulcers; POA Pressure Injury 05/02/23 Sacrum Stage 1 -  Intact skin with non-blanchable redness of a localized area usually over a bony prominence. (Active)  05/02/23 2039  Location: Sacrum  Location Orientation:   Staging: Stage 1 -  Intact skin with non-blanchable redness of a localized area usually over a bony prominence.  Wound Description (Comments):   Present on Admission:       Physical deconditioning PT recommended SNF which was declined by her insurance Peer-to-peer review done, discussed with medical director at home and community care transition who declines placement at this time because PT notes show that patient is doing better. Discussed recommendation with patient who has appealed the process.                Subjective: Complains of early satiety  Physical Exam: Vitals:   05/13/23 1949 05/14/23 0204 05/14/23 0409 05/14/23 0738  BP: (!) 138/57  (!) 108/51 (!) 117/47  Pulse: 67  64 (!) 58  Resp: 18  20 18   Temp: 98.2 F (36.8 C)  97.9 F (36.6 C) 97.8 F (36.6 C)  TempSrc:   Oral   SpO2: 100%  97% 99%  Weight:  60.5 kg    Height:       General exam: Appears calm and comfortable  Respiratory system: Bilateral air entry.  Scattered expiratory  wheezes Cardiovascular system: S1 & S2 heard, RRR. No JVD, murmurs, rubs, gallops or clicks. No pedal edema. Gastrointestinal system: Abdomen is nondistended, soft and nontender. No organomegaly or masses felt. Normal bowel sounds heard. Central nervous system: Alert and oriented x3. No focal neurological deficits. Extremities: Symmetric 5 x 5 power. Skin: No rashes, lesions or ulcers Psychiatry: Judgement and insight appear normal. Mood & affect appropriate.      Data Reviewed:  There are no new results to review at this time.  Family Communication: Plan of care discussed with patient in detail.  Disposition: Status is: Inpatient Remains inpatient appropriate because: Discharge to SNF  Planned Discharge Destination: Skilled nursing facility    Time spent: 32 minutes  Author: Lucile Shutters, MD 05/14/2023 11:18 AM  For on call review [email or web address].

## 2023-05-14 NOTE — Progress Notes (Signed)
Physical Therapy Treatment Patient Details Name: [name] MRN: [id number] DOB: 30-Jan-1946 Today's Date: 05/14/2023   History of Present Illness 77 y.o. female with medical history significant for peripheral arterial disease, COPD on chronic 3-4 L home oxygen, hypertension, HFpEF(EF 45-50% 10/2022)) , remote breast cancer, colon cancer s/p resection, chronic back pain on buprenorphine patch and gastric ulcer hospitalized from 11/17 to 05/01/2023 at [facility] for perforated gastric ulcer with melena, requiring emergent surgery discharged the same day to SNF who presents to ED with shortness of breath. Patient was started on IV Lasix for CHF exacerbation.    PT Comments  Pt is making good progress towards goals. Reports she did not perform OOB yesterday due to feeling sickly. Able to ambulate in hallway with 1 seated rest break. Still demonstrates decreased endurance. All mobility performed on 4L of O2. Cues for posture. Will continue to progress as able.    If plan is discharge home, recommend the following: A little help with walking and/or transfers;A little help with bathing/dressing/bathroom;Assistance with cooking/housework;Direct supervision/assist for medications management;Direct supervision/assist for financial management;Assist for transportation;Help with stairs or ramp for entrance   Can travel by private vehicle     Yes  Equipment Recommendations  Other (comment)    Recommendations for Other Services       Precautions / Restrictions Precautions Precautions: Fall Precaution Comments: recent ex lap Restrictions Weight Bearing Restrictions Per Provider Order: No     Mobility  Bed Mobility               General bed mobility comments: NT Pt was in recliner pre/post session    Transfers Overall transfer level: Needs assistance Equipment used: Rolling walker (2 wheels) Transfers: Sit to/from Stand, Bed to chair/wheelchair/BSC Sit to Stand:  Supervision           General transfer comment: Safe technique with ability to push from seated surface. ONce standing, flexed posture. Adjusted RW to correct height    Ambulation/Gait Ambulation/Gait assistance: Supervision Gait Distance (Feet): 150 Feet Assistive device: Rolling walker (2 wheels) Gait Pattern/deviations: Step-through pattern, Trunk flexed, Decreased stride length       General Gait Details: pt ambulated with RW on 4L of O2 with 1 seated rest break required secondary to fatigue. Unable to obtain accurate pulse ox secondary to cold extremities. Occasional cue to keep closer to AD.   Stairs             Wheelchair Mobility     Tilt Bed    Modified Rankin (Stroke Patients Only)       Balance Overall balance assessment: Needs assistance Sitting-balance support: No upper extremity supported, Feet supported Sitting balance-Leahy Scale: Good     Standing balance support: During functional activity, Single extremity supported Standing balance-Leahy Scale: Fair                              Cognition Arousal: Alert Behavior During Therapy: WFL for tasks assessed/performed Overall Cognitive Status: Within Functional Limits for tasks assessed                                 General Comments: pleasant and agreeable        Exercises      General Comments        Pertinent Vitals/Pain Pain Assessment Pain Assessment: Faces Faces Pain Scale: Hurts a little bit Pain  Location: LBP Pain Descriptors / Indicators: Aching, Grimacing, Guarding Pain Intervention(s): Limited activity within patient's tolerance, Repositioned, Premedicated before session    Home Living                          Prior Function            PT Goals (current goals can now be found in the care plan section) Acute Rehab PT Goals Patient Stated Goal: regain independence PT Goal Formulation: With patient Time For Goal Achievement:  05/16/23 Potential to Achieve Goals: Good Progress towards PT goals: Progressing toward goals    Frequency    Min 1X/week      PT Plan      Co-evaluation              AM-PAC PT "6 Clicks" Mobility   Outcome Measure  Help needed turning from your back to your side while in a flat bed without using bedrails?: A Little Help needed moving from lying on your back to sitting on the side of a flat bed without using bedrails?: A Little Help needed moving to and from a bed to a chair (including a wheelchair)?: None Help needed standing up from a chair using your arms (e.g., wheelchair or bedside chair)?: None Help needed to walk in hospital room?: A Little Help needed climbing 3-5 steps with a railing? : A Little 6 Click Score: 20    End of Session Equipment Utilized During Treatment: Oxygen;Gait belt Activity Tolerance: Patient tolerated treatment well;Patient limited by fatigue Patient left: in chair;with chair alarm set Nurse Communication: Mobility status PT Visit Diagnosis: Muscle weakness (generalized) (M62.81);Unsteadiness on feet (R26.81)     Time: 3244-0102 PT Time Calculation (min) (ACUTE ONLY): 25 min  Charges:    $Gait Training: 23-37 mins PT General Charges $$ ACUTE PT VISIT: 1 Visit                     Elizabeth Palau, PT, DPT, GCS [phone]    Cort Dragoo 05/14/2023, 1:07 PM

## 2023-05-14 NOTE — Care Management Important Message (Signed)
Important Message  Patient Details  Name: [name] MRN: [id number] Date of Birth: [birth year]   Important Message Given:  Yes - Medicare IM     Bernadette Hoit 05/14/2023, 10:55 AM

## 2023-05-14 NOTE — Progress Notes (Signed)
Occupational Therapy Treatment Patient Details Name: [name] MRN: [id number] DOB: 1945/12/11 Today's Date: 05/14/2023   History of present illness 77 y.o. female with medical history significant for peripheral arterial disease, COPD on chronic 3-4 L home oxygen, hypertension, HFpEF(EF 45-50% 10/2022)) , remote breast cancer, colon cancer s/p resection, chronic back pain on buprenorphine patch and gastric ulcer hospitalized from 11/17 to 05/01/2023 at [facility] for perforated gastric ulcer with melena, requiring emergent surgery discharged the same day to SNF who presents to ED with shortness of breath. Patient was started on IV Lasix for CHF exacerbation.   OT comments  Pt seen for OT treatment on this date. Upon arrival to room pt in bed, agreeable to tx. Pt requires supervision for bed mobility, transfers, tolerates +8 mins standing at sink for oral care on 4L O2. Pt making good progress toward goals, will continue to follow POC. Discharge recommendation remains appropriate.  Patient will benefit from continued inpatient follow up therapy, <3 hours/day.       If plan is discharge home, recommend the following:  A little help with walking and/or transfers;A lot of help with bathing/dressing/bathroom;Assistance with cooking/housework;Assist for transportation;Help with stairs or ramp for entrance   Equipment Recommendations  BSC/3in1;Other (comment)       Precautions / Restrictions Precautions Precautions: Fall Precaution Comments: recent ex lap Restrictions Weight Bearing Restrictions Per Provider Order: No       Mobility Bed Mobility Overal bed mobility: Needs Assistance Bed Mobility: Sit to Supine, Supine to Sit     Supine to sit: HOB elevated, Supervision Sit to supine: Supervision, HOB elevated        Transfers Overall transfer level: Needs assistance   Transfers: Sit to/from Stand Sit to Stand: Supervision                 Balance Overall  balance assessment: Needs assistance Sitting-balance support: No upper extremity supported, Feet supported Sitting balance-Leahy Scale: Good     Standing balance support: During functional activity, Single extremity supported Standing balance-Leahy Scale: Fair Standing balance comment: standing ADLs at sink                           ADL either performed or assessed with clinical judgement   ADL Overall ADL's : Needs assistance/impaired     Grooming: Oral care;Wash/dry face;Wash/dry hands;Standing;Supervision/safety Grooming Details (indicate cue type and reason): Oral care with dentures standing at sink, occasional use of BUE for support in standing             Lower Body Dressing: Bed level;Set up Lower Body Dressing Details (indicate cue type and reason): donned socks               General ADL Comments: Supervision for bed mobility and standing ADLs, on 4L O2      Cognition Arousal: Alert Behavior During Therapy: WFL for tasks assessed/performed Overall Cognitive Status: Within Functional Limits for tasks assessed                                 General Comments: pleasant and agreeable                   Pertinent Vitals/ Pain       Pain Assessment Pain Assessment: Faces Faces Pain Scale: Hurts a little bit Pain Location: LBP Pain Descriptors / Indicators: Aching, Grimacing, Guarding Pain Intervention(s):  Limited activity within patient's tolerance   Frequency  Min 1X/week        Progress Toward Goals  OT Goals(current goals can now be found in the care plan section)  Progress towards OT goals: Progressing toward goals      AM-PAC OT "6 Clicks" Daily Activity     Outcome Measure   Help from another person eating meals?: None Help from another person taking care of personal grooming?: A Little Help from another person toileting, which includes using toliet, bedpan, or urinal?: A Little Help from another person  bathing (including washing, rinsing, drying)?: A Little Help from another person to put on and taking off regular upper body clothing?: A Little Help from another person to put on and taking off regular lower body clothing?: A Little 6 Click Score: 19    End of Session Equipment Utilized During Treatment: Oxygen  OT Visit Diagnosis: Other abnormalities of gait and mobility (R26.89);Muscle weakness (generalized) (M62.81);Pain   Activity Tolerance Patient tolerated treatment well   Patient Left in bed;with call bell/phone within reach;with bed alarm set   Nurse Communication          Time: 0865-7846 OT Time Calculation (min): 15 min  Charges: OT General Charges $OT Visit: 1 Visit OT Treatments $Self Care/Home Management : 8-22 mins  Lekia Nier L. Ericha Whittingham, OTR/L  05/14/23, 4:48 PM

## 2023-05-15 DIAGNOSIS — I5033 Acute on chronic diastolic (congestive) heart failure: Secondary | ICD-10-CM | POA: Diagnosis not present

## 2023-05-15 LAB — BASIC METABOLIC PANEL
Anion gap: 9 (ref 5–15)
BUN: 38 mg/dL — ABNORMAL HIGH (ref 8–23)
CO2: 32 mmol/L (ref 22–32)
Calcium: 9.8 mg/dL (ref 8.9–10.3)
Chloride: 97 mmol/L — ABNORMAL LOW (ref 98–111)
Creatinine, Ser: 1.22 mg/dL — ABNORMAL HIGH (ref 0.44–1.00)
GFR, Estimated: 46 mL/min — ABNORMAL LOW (ref >60)
Glucose, Bld: 79 mg/dL (ref 70–99)
Potassium: 4.4 mmol/L (ref 3.5–5.1)
Sodium: 138 mmol/L (ref 135–145)

## 2023-05-15 MED ORDER — QUETIAPINE FUMARATE 25 MG PO TABS: 25.0000 mg | ORAL_TABLET | Freq: Every day | ORAL | 0 refills | Status: DC

## 2023-05-15 MED ORDER — BUDESONIDE-FORMOTEROL FUMARATE 160-4.5 MCG/ACT IN AERO: 2.0000 | INHALATION_SPRAY | Freq: Every day | RESPIRATORY_TRACT | 12 refills | Status: DC

## 2023-05-15 MED ORDER — TORSEMIDE 20 MG PO TABS: 20.0000 mg | ORAL_TABLET | Freq: Every day | ORAL | 0 refills | Status: DC

## 2023-05-15 MED ORDER — ALPRAZOLAM 1 MG PO TABS: 1.0000 mg | ORAL_TABLET | Freq: Two times a day (BID) | ORAL | Status: DC | PRN

## 2023-05-15 MED ORDER — BUPRENORPHINE 10 MCG/HR TD PTWK: 1.0000 | MEDICATED_PATCH | TRANSDERMAL | 0 refills | Status: AC

## 2023-05-15 MED ORDER — ALPRAZOLAM 1 MG PO TABS: 1.0000 mg | ORAL_TABLET | Freq: Two times a day (BID) | ORAL | 0 refills | Status: AC | PRN

## 2023-05-15 MED ORDER — SPIRONOLACTONE 25 MG PO TABS: 25.0000 mg | ORAL_TABLET | Freq: Every day | ORAL | 0 refills | Status: DC

## 2023-05-15 MED ORDER — POLYSACCHARIDE IRON COMPLEX 150 MG PO CAPS: 150.0000 mg | ORAL_CAPSULE | Freq: Every day | ORAL | 0 refills | Status: DC

## 2023-05-15 MED ORDER — TORSEMIDE 20 MG PO TABS: 20.0000 mg | ORAL_TABLET | Freq: Every day | ORAL | Status: DC

## 2023-05-15 NOTE — TOC Progression Note (Signed)
Transition of Care [facility]) - Progression Note    Patient Details  Name: [name] MRN: [id number] Date of Birth: May 19, 1946  Transition of [facility]) CM/SW Contact  Allena Katz, LCSW Phone Number: 05/15/2023, 12:39 PM  Clinical Narrative:   Appeal was overturned for peak. AUTH ID [id number].          Expected Discharge Plan and Services                                               Social Determinants of Health (SDOH) Interventions SDOH Screenings   Food Insecurity: No Food Insecurity (05/10/2023)  Housing: Low Risk  (05/04/2023)  Transportation Needs: No Transportation Needs (05/04/2023)  Utilities: Not At Risk (05/04/2023)  Financial Resource Strain: Low Risk  (04/17/2023)   Received from [facility], [facility]  Tobacco Use: Medium Risk (05/11/2023)    Readmission Risk Interventions    05/02/2023   12:22 PM 09/22/2021    1:38 PM  Readmission Risk Prevention Plan  Transportation Screening Complete Complete  PCP or Specialist Appt within 3-5 Days Complete   HRI or Home Care Consult Complete   Social Work Consult for Recovery Care Planning/Counseling Complete   Palliative Care Screening Not Applicable   Medication Review Ocean[employer]) Complete Complete  HRI or Home Care Consult  Complete  SW Recovery Care/Counseling Consult  Complete  Palliative Care Screening  Not Applicable  Skilled Nursing Facility  Not Applicable

## 2023-05-15 NOTE — Progress Notes (Signed)
Called report to [phone] and gave report to Marcelino Duster, RN assuming care of Ms. Zirkle.  Patient going to room 807 via EMS. Pts belongings sent with pt.

## 2023-05-15 NOTE — Discharge Summary (Signed)
Physician Discharge Summary   Patient: [name] MRN: [id number] DOB: 1945/10/16  Admit date:     05/01/2023  Discharge date: 05/15/23  Discharge Physician: Mahogany Torrance   PCP: Ardyth Gal, MD   Recommendations at discharge:   Keep scheduled follow-up appointment with primary care provider and surgeon  Discharge Diagnoses: Principal Problem:   Acute on chronic heart failure with preserved ejection fraction (HFpEF) (HCC) Active Problems:   AKI (acute kidney injury) (HCC)   Acute on chronic respiratory failure with hypoxia (HCC)   COPD (chronic obstructive pulmonary disease) (HCC)   S/P exploratory laparotomy 04/08/23 for perforated gastric ulcer   HTN (hypertension)   PAD (peripheral artery disease) (HCC)   Hypokalemia   Anxiety and depression   Chronic back pain   Iron deficiency anemia   Chronic anemia   Pressure injury of skin  Resolved Problems:   * No resolved hospital problems. *  Hospital Course:   [name] is a 77 y.o. female with medical history significant for peripheral arterial disease, COPD on chronic 3-4 L home oxygen, hypertension, HFpEF(EF 45-50% 10/2022)) , remote breast cancer, colon cancer s/p resection, chronic back pain on buprenorphine patch and gastric ulcer hospitalized from 11/17 to 05/01/2023 at [facility] for perforated gastric ulcer with melena, requiring emergent surgery discharged the same day to SNF who presents to ED with shortness of breath.  During her hospitalization, per discharge summary, patient had frequent episodes of respiratory distress with mild O2 desaturation with 'negative workup'.  She was discharged on 4 L. Patient states on arrival to rehab none of her medications were available and her shortness of breath worsened in the control with since she arrived prompting the visit to the ED.  She denies chest pain, cough, nausea vomiting or diaphoresis.  Denies any extremity swelling or pain. Of note, patient had  ongoing chronic back pain during hier stay and was seen by the chronic pain team and was discharged on buprenorphine high-dose gabapentin and Robaxin. ED course and data review: Tachycardic to 120, tachypneic to 28 on arrival requiring 6 L to maintain sats in the mid 90s.  Afebrile, BP 165/86 Pertinent findings on workup as follows: Troponin 24 and BNP 340 Hemoglobin 9.4 (9.8 on 12/6) Urinalysis unremarkable BMP and Hepatic function panel unremarkable EKG, personally viewed and interpreted showing sinus tachycardia at 106 with no ischemic ST-T wave changes. CTA chest negative for PE, showing bilateral pleural effusions right larger than left Patient was started on IV Lasix and given a DuoNeb Hospitalist consulted for admission    Assessment and Plan:  Acute on chronic heart failure with preserved ejection fraction (HFpEF) (HCC) Bilateral pleural effusion Acute on chronic respiratory failure with hypoxia Increased work of breathing tachypneic to 28 and tachycardic, BNP 340 and CTA showing bilateral pleural effusions(negative for PE) on admission Patient initially required 6 L to maintain sats in the mid 90s up from baseline at 3.5L. Currently back to her baseline home oxygen requirement but episodes of desaturation with ambulation.  May require higher oxygen levels with exertion. Echocardiogram 1 year ago showed ejection fraction 50 to 55%, repeat echocardiogram showed ejection ejection fraction 60 to 65% with grade 1 diastolic dysfunction. Patient was treated with IV Lasix, volume status much better today.   12/15. Condition continued to be stable, patient request to reduce the dose of diuretics due to frequent urination.  Decrease torsemide to 20 mg daily. 12/16.  Patient condition has stabilized, currently pending nursing home placement.  Patient  has short of breath with minimal exertion, appeared to have poor prognosis, will obtain palliative care. 12/17.  Hold off diuretics because kidney  function slightly worsened.  Still medically stable for discharge pending nursing placement. 12/18 Renal function is back to baseline.  Resume diuretic therapy 12/22 noted to have episodes of desaturation with exertion on 4 L, pulse oximetry drops in the low 80s and improves at rest.  Chest x-ray showed mild central vascular congestion.  Cardiac enlargement.  Ordered 2 doses of Lasix 40 mg IV but patient would rather take oral as she does not have an IV in at this time and does not want to be stuck.       Acute kidney injury secondary to diuretics. Hypokalemia. Secondary to diuretic therapy Potassium has been supplemented Renal function is back to baseline     COPD (chronic obstructive pulmonary disease) (HCC) Appears to be at end-stage, palliative care consult obtained.     S/P exploratory laparotomy 04/08/23 for perforated gastric ulcer No acute issues suspected Hemoglobin stable Continue Protonix twice daily Patient has completed a course of Augmentin which was prescribed at [facility] upon discharge.       HTN (hypertension) Continue home amlodipine 5 mg daily, losartan 25 daily     PAD (peripheral artery disease) (HCC) left SFA CTO with failed attempt in 01/2019 for revascularization & right calcified iliac artery stenosis  Continue atorvastatin Followed at [facility]     Chronic iron deficiency anemia History of melena 11/18 secondary to perforated gastric ulcer Patient has mild iron deficiency, B12 normal.  Continue oral iron therapy.     Chronic back pain Continue Butrans patch and Dilaudid PRN     Anxiety and depression Restless leg syndrome/insomnia Xanax 0.25 daily as needed anxiety, ropinirole 1 mg 3 times daily, melatonin 3 mg nightly     Pressure ulcers; POA Pressure Injury 05/02/23 Sacrum Stage 1 -  Intact skin with non-blanchable redness of a localized area usually over a bony prominence. (Active)  05/02/23 2039  Location: Sacrum  Location Orientation:    Staging: Stage 1 -  Intact skin with non-blanchable redness of a localized area usually over a bony prominence.  Wound Description (Comments):   Present on Admission:       Physical deconditioning PT recommended SNF which was declined by her insurance Peer-to-peer review done, discussed with medical director at home and community care transition who declines placement at this time because PT notes show that patient is doing better. Discussed recommendation with patient who has appealed the process.           Consultants: Palliative care Procedures performed:   Disposition: Skilled nursing facility Diet recommendation:  Discharge Diet Orders (From admission, onward)     Start     Ordered   05/15/23 0000  Diet - low sodium heart healthy        05/15/23 1247           Cardiac diet DISCHARGE MEDICATION: Allergies as of 05/15/2023   No Known Allergies      Medication List     STOP taking these medications    doxycycline 100 MG capsule Commonly known as: VIBRAMYCIN   furosemide 20 MG tablet Commonly known as: LASIX       TAKE these medications    acetaminophen 650 MG CR tablet Commonly known as: TYLENOL Take 650 mg by mouth every 8 (eight) hours as needed for pain.   ALPRAZolam 1 MG tablet Commonly known as: XANAX Take  1 tablet (1 mg total) by mouth 2 (two) times daily as needed for up to 15 days for anxiety. What changed: reasons to take this   amLODipine 5 MG tablet Commonly known as: NORVASC Take 1 tablet (5 mg total) by mouth daily.   atorvastatin 10 MG tablet Commonly known as: LIPITOR Take 1 tablet (10 mg total) by mouth daily.   budesonide-formoterol 160-4.5 MCG/ACT inhaler Commonly known as: Symbicort Inhale 2 puffs into the lungs daily.   buprenorphine 10 MCG/HR Ptwk Commonly known as: BUTRANS Place 1 patch onto the skin once a week.   Cholecalciferol 25 MCG (1000 UT) Tbdp Take 1,000 Units by mouth daily.   cyanocobalamin 1000  MCG tablet Commonly known as: VITAMIN B12 Take 1,000 mcg by mouth daily.   gabapentin 300 MG capsule Commonly known as: NEURONTIN Take 1 capsule (300 mg total) by mouth 3 (three) times daily. What changed: how much to take   ipratropium-albuterol 0.5-2.5 (3) MG/3ML Soln Commonly known as: DUONEB Take 3 mLs by nebulization every 6 (six) hours as needed.   iron polysaccharides 150 MG capsule Commonly known as: NIFEREX Take 1 capsule (150 mg total) by mouth daily. Start taking on: May 16, 2023   losartan 25 MG tablet Commonly known as: COZAAR Take 25 mg by mouth daily.   Multivitamin Adult Extra C Chew   potassium chloride 10 MEQ tablet Commonly known as: KLOR-CON Take 1 tablet (10 mEq total) by mouth daily as needed (With Furosemide). Take with lasix   QUEtiapine 25 MG tablet Commonly known as: SEROQUEL Take 1 tablet (25 mg total) by mouth at bedtime.   rOPINIRole 1 MG tablet Commonly known as: REQUIP Take 1 tablet (1 mg total) by mouth 3 (three) times daily. What changed: how much to take   spironolactone 25 MG tablet Commonly known as: ALDACTONE Take 1 tablet (25 mg total) by mouth daily. Start taking on: May 16, 2023   torsemide 20 MG tablet Commonly known as: DEMADEX Take 1 tablet (20 mg total) by mouth daily. Start taking on: May 16, 2023        Contact information for follow-up providers     Ardyth Gal, MD Follow up.   Specialty: Internal Medicine Why: Hospital follow up Contact information: [address] [facility] [place] [address] [phone]              Contact information for after-discharge care     Destination     [facility] Preferred SNF .   Service: Skilled Nursing Contact information: [address] [place] [address] [phone]                    Discharge Exam: Ceasar Mons Weights   05/13/23 0500 05/13/23 1900 05/14/23 0204  Weight: 59 kg 60.5 kg  60.5 kg   General exam: Appears calm and comfortable, chronically ill-appearing, frail Respiratory system: Bilateral air entry.  Scattered expiratory wheezes Cardiovascular system: S1 & S2 heard, RRR. No JVD, murmurs, rubs, gallops or clicks. No pedal edema. Gastrointestinal system: Abdomen is nondistended, soft and nontender. No organomegaly or masses felt. Normal bowel sounds heard. Central nervous system: Alert and oriented x3. No focal neurological deficits. Extremities: Symmetric 5 x 5 power. Skin: No rashes, lesions or ulcers Psychiatry: Judgement and insight appear normal. Mood & affect appropriate.  Condition at discharge: stable  The results of significant diagnostics from this hospitalization (including imaging, microbiology, ancillary and laboratory) are listed below for  reference.   Imaging Studies: DG Abd 1 View Result Date: 05/14/2023 CLINICAL DATA:  Abdominal pain EXAM: ABDOMEN - 1 VIEW COMPARISON:  09/18/2021 FINDINGS: Postoperative changes in the lumbar spine. Nonobstructive bowel gas pattern. No organomegaly or free air. No suspicious calcification. Calcified phleboliths in the lower pelvis. Visualized lung bases clear. IMPRESSION: No acute findings. Electronically Signed   By: Charlett Nose M.D.   On: 05/14/2023 20:38   DG Chest 1 View Result Date: 05/12/2023 CLINICAL DATA:  Heart failure with preserved ejection fraction. EXAM: CHEST  1 VIEW COMPARISON:  05/01/2023 FINDINGS: Shallow inspiration. Cardiac enlargement. Mild central vascular congestion. No edema or consolidation. No pleural effusions. No pneumothorax. Mediastinal contours appear intact. Calcification of the aorta. Old right rib fractures. Postoperative changes in both shoulders and in the lumbar spine. Degenerative changes in the spine with thoracolumbar scoliosis convex towards the right. IMPRESSION: Cardiac enlargement with mild central vascular congestion. No edema or consolidation. Electronically Signed   By:  Burman Nieves M.D.   On: 05/12/2023 18:22   ECHOCARDIOGRAM COMPLETE Result Date: 05/03/2023    ECHOCARDIOGRAM REPORT   Patient Name:   [name] Date of Exam: 05/02/2023 Medical Rec #:  [id number]             Height:       58.0 in Accession #:    [id number]            Weight:       113.0 lb Date of Birth:  08-02-1945             BSA:          1.429 m Patient Age:    77 years              BP:           129/54 mmHg Patient Gender: F                     HR:           75 bpm. Exam Location:  [facility] Procedure: 2D Echo, Cardiac Doppler and Color Doppler Indications:     I50.31 Acute Diastolic CHF  History:         Patient has prior history of Echocardiogram examinations, most                  recent 11/04/2021. Signs/Symptoms:Shortness of Breath; Risk                  Factors:Former Smoker, Dyslipidemia and Hypertension.  Sonographer:     Daphine Deutscher RDCS Referring Phys:  [id number] Andris Baumann Diagnosing Phys: Alwyn Pea MD IMPRESSIONS  1. Left ventricular ejection fraction, by estimation, is 65 to 70%. The left ventricle has normal function. The left ventricle has no regional wall motion abnormalities. The left ventricular internal cavity size was mildly dilated. Left ventricular diastolic parameters are consistent with Grade I diastolic dysfunction (impaired relaxation).  2. Right ventricular systolic function is normal. The right ventricular size is normal.  3. Left atrial size was mildly dilated.  4. The mitral valve is normal in structure. Trivial mitral valve regurgitation.  5. The aortic valve is normal in structure. Aortic valve regurgitation is not visualized. FINDINGS  Left Ventricle: Left ventricular ejection fraction, by estimation, is 65 to 70%. The left ventricle has normal function. The left ventricle has no regional wall motion abnormalities. The left ventricular internal cavity size was mildly dilated. There is  no left ventricular hypertrophy. Left ventricular diastolic  parameters are consistent with Grade I diastolic dysfunction (impaired relaxation). Right Ventricle: The right ventricular size is normal. No increase in right ventricular wall thickness. Right ventricular systolic function is normal. Left Atrium: Left atrial size was mildly dilated. Right Atrium: Right atrial size was normal in size. Pericardium: There is no evidence of pericardial effusion. Mitral Valve: The mitral valve is normal in structure. Trivial mitral valve regurgitation. Tricuspid Valve: The tricuspid valve is normal in structure. Tricuspid valve regurgitation is mild. Aortic Valve: The aortic valve is normal in structure. Aortic valve regurgitation is not visualized. Pulmonic Valve: The pulmonic valve was normal in structure. Pulmonic valve regurgitation is not visualized. Aorta: The ascending aorta was not well visualized. IAS/Shunts: No atrial level shunt detected by color flow Doppler.  LEFT VENTRICLE PLAX 2D LVIDd:         5.30 cm   Diastology LVIDs:         3.30 cm   LV e' medial:    7.72 cm/s LV PW:         0.70 cm   LV E/e' medial:  11.3 LV IVS:        0.70 cm   LV e' lateral:   9.73 cm/s LVOT diam:     1.90 cm   LV E/e' lateral: 8.9 LV SV:         64 LV SV Index:   45 LVOT Area:     2.84 cm  RIGHT VENTRICLE            IVC RV Basal diam:  3.40 cm    IVC diam: 2.10 cm RV S prime:     9.17 cm/s TAPSE (M-mode): 1.9 cm LEFT ATRIUM             Index        RIGHT ATRIUM          Index LA diam:        4.70 cm 3.29 cm/m   RA Area:     9.29 cm LA Vol (A2C):   33.8 ml 23.66 ml/m  RA Volume:   19.10 ml 13.37 ml/m LA Vol (A4C):   37.4 ml 26.18 ml/m LA Biplane Vol: 38.5 ml 26.95 ml/m  AORTIC VALVE LVOT Vmax:   120.67 cm/s LVOT Vmean:  72.967 cm/s LVOT VTI:    0.227 m  AORTA Ao Root diam: 2.90 cm Ao Asc diam:  3.30 cm MITRAL VALVE                TRICUSPID VALVE MV Area (PHT): 3.98 cm     TR Peak grad:   31.1 mmHg MV Decel Time: 191 msec     TR Vmax:        279.00 cm/s MV E velocity: 87.00 cm/s MV A  velocity: 112.00 cm/s  SHUNTS MV E/A ratio:  0.78         Systemic VTI:  0.23 m                             Systemic Diam: 1.90 cm Alwyn Pea MD Electronically signed by Alwyn Pea MD Signature Date/Time: 05/03/2023/4:11:52 PM    Final    CT Angio Chest PE W and/or Wo Contrast Result Date: 05/02/2023 CLINICAL DATA:  Pulmonary embolism (PE) suspected, high prob hypoxia, recent surgery to gastric ulcer w/perf, copd. Shortness of breath. EXAM: CT ANGIOGRAPHY CHEST WITH CONTRAST  TECHNIQUE: Multidetector CT imaging of the chest was performed using the standard protocol during bolus administration of intravenous contrast. Multiplanar CT image reconstructions and MIPs were obtained to evaluate the vascular anatomy. RADIATION DOSE REDUCTION: This exam was performed according to the departmental dose-optimization program which includes automated exposure control, adjustment of the mA and/or kV according to patient size and/or use of iterative reconstruction technique. CONTRAST:  75mL OMNIPAQUE IOHEXOL 350 MG/ML SOLN COMPARISON:  06/21/2019. FINDINGS: Cardiovascular: No filling defects in the pulmonary arteries to suggest pulmonary emboli. Cardiomegaly. Moderate coronary artery and aortic atherosclerosis. No evidence of aortic aneurysm. Mediastinum/Nodes: No mediastinal, hilar, or axillary adenopathy. Trachea and esophagus are unremarkable. Thyroid unremarkable. Lungs/Pleura: Moderate centrilobular emphysema. Moderate right and small left pleural effusions. Dependent/compressive atelectasis in the lower lobes bilaterally. Upper Abdomen: No acute findings Musculoskeletal: Chest wall soft tissues are unremarkable. No acute bony abnormality. Review of the MIP images confirms the above findings. IMPRESSION: No evidence of pulmonary embolus. Bilateral pleural effusions, right larger than left. Compressive atelectasis in the lower lobes. Coronary artery disease. Aortic Atherosclerosis (ICD10-I70.0) and Emphysema  (ICD10-J43.9). Electronically Signed   By: Charlett Nose M.D.   On: 05/02/2023 00:26   DG Chest 1 View Result Date: 05/01/2023 CLINICAL DATA:  Shortness of breath EXAM: PORTABLE CHEST 1 VIEW COMPARISON:  11/24/2021 FINDINGS: Cardiac shadow is enlarged but accentuated by the portable technique. The lungs are well aerated bilaterally. Mild right basilar atelectasis is seen. No bony abnormality is noted. Bilateral shoulder replacements are seen. IMPRESSION: Right basilar atelectasis. Electronically Signed   By: Alcide Clever M.D.   On: 05/01/2023 23:05    Microbiology: Results for orders placed or performed during the hospital encounter of 11/07/20  SARS CORONAVIRUS 2 (TAT 6-24 HRS) Nasopharyngeal Nasopharyngeal Swab     Status: None   Collection Time: 11/08/20  3:17 AM   Specimen: Nasopharyngeal Swab  Result Value Ref Range Status   SARS Coronavirus 2 NEGATIVE NEGATIVE Final    Comment: (NOTE) SARS-CoV-2 target nucleic acids are NOT DETECTED.  The SARS-CoV-2 RNA is generally detectable in upper and lower respiratory specimens during the acute phase of infection. Negative results do not preclude SARS-CoV-2 infection, do not rule out co-infections with other pathogens, and should not be used as the sole basis for treatment or other patient management decisions. Negative results must be combined with clinical observations, patient history, and epidemiological information. The expected result is Negative.  Fact Sheet for Patients: HairSlick.no  Fact Sheet for Healthcare Providers: quierodirigir.com  This test is not yet approved or cleared by the [place] FDA and  has been authorized for detection and/or diagnosis of SARS-CoV-2 by FDA under an Emergency Use Authorization (EUA). This EUA will remain  in effect (meaning this test can be used) for the duration of the COVID-19 declaration under Se ction 564(b)(1) of the Act, 21  U.S.C. section 360bbb-3(b)(1), unless the authorization is terminated or revoked sooner.  Performed at [facility], [address], [place], [place] [address]     Labs: CBC: Recent Labs  Lab 05/10/23 0818  WBC 4.2  HGB 8.4*  HCT 28.1*  MCV 88.9  PLT 124*   Basic Metabolic Panel: Recent Labs  Lab 05/09/23 0733 05/12/23 0738 05/13/23 0839 05/15/23 0653  NA 141 140 137 138  K 4.2 3.7 4.3 4.4  CL 100 99 97* 97*  CO2 34* 32 30 32  GLUCOSE 86 94 93 79  BUN 30* 29* 34* 38*  CREATININE 0.99 1.13* 1.07*  1.22*  CALCIUM 10.1 10.2 10.2 9.8  MG  --   --  2.0  --    Liver Function Tests: No results for input(s): "AST", "ALT", "ALKPHOS", "BILITOT", "PROT", "ALBUMIN" in the last 168 hours. CBG: No results for input(s): "GLUCAP" in the last 168 hours.  Discharge time spent: greater than 30 minutes.  Signed: Lucile Shutters, MD [facility] 05/15/2023

## 2023-05-15 NOTE — Progress Notes (Signed)
Progress Note   Patient: [name] UJW:[id number] DOB: 04-Mar-1946 DOA: 05/01/2023     13 DOS: the patient was seen and examined on 05/15/2023   Brief hospital course:  [name] is a 77 y.o. female with medical history significant for peripheral arterial disease, COPD on chronic 3-4 L home oxygen, hypertension, HFpEF(EF 45-50% 10/2022)) , remote breast cancer, colon cancer s/p resection, chronic back pain on buprenorphine patch and gastric ulcer hospitalized from 11/17 to 05/01/2023 at [facility] for perforated gastric ulcer with melena, requiring emergent surgery discharged the same day to SNF who presents to ED with shortness of breath.  Upon arriving to hospital, heart rate was 120, tachypnea 28, was placed on 6 L oxygen for significant hypoxemia.  Troponin was 24, BNP 340.  CT angiogram of the chest to rule out a PE, showed bilateral pleural effusion.  Patient was started on IV Lasix for CHF exacerbation. 12/12.  Volume status much better, changed to oral diuretics.     Principal Problem:   Acute on chronic heart failure with preserved ejection fraction (HFpEF) (HCC) Active Problems:   AKI (acute kidney injury) (HCC)   Acute on chronic respiratory failure with hypoxia (HCC)   COPD (chronic obstructive pulmonary disease) (HCC)   S/P exploratory laparotomy 04/08/23 for perforated gastric ulcer   HTN (hypertension)   PAD (peripheral artery disease) (HCC)   Hypokalemia   Anxiety and depression   Chronic back pain   Iron deficiency anemia   Chronic anemia   Pressure injury of skin      Assessment and Plan:  Acute on chronic heart failure with preserved ejection fraction (HFpEF) (HCC) Bilateral pleural effusion Acute on chronic respiratory failure with hypoxia Increased work of breathing tachypneic to 28 and tachycardic, BNP 340 and CTA showing bilateral pleural effusions(negative for PE) on admission Patient initially required 6 L to maintain sats in the mid 90s  up from baseline at 3.5L. Currently back to her baseline home oxygen requirement but episodes of desaturation with ambulation.  May require higher oxygen levels with exertion. Echocardiogram 1 year ago showed ejection fraction 50 to 55%, repeat echocardiogram showed ejection ejection fraction 60 to 65% with grade 1 diastolic dysfunction. Patient was treated with IV Lasix, volume status much better today.   12/15. Condition continued to be stable, patient request to reduce the dose of diuretics due to frequent urination.  Decrease torsemide to 20 mg daily. 12/16.  Patient condition has stabilized, currently pending nursing home placement.  Patient has short of breath with minimal exertion, appeared to have poor prognosis, will obtain palliative care. 12/17.  Hold off diuretics because kidney function slightly worsened.  Still medically stable for discharge pending nursing placement. 12/18 Renal function is back to baseline.  Resume diuretic therapy 12/22 noted to have episodes of desaturation with exertion on 4 L, pulse oximetry drops in the low 80s and improves at rest.  Chest x-ray showed mild central vascular congestion.  Cardiac enlargement.  Ordered 2 doses of Lasix 40 mg IV but patient would rather take oral as she does not have an IV in at this time and does not want to be stuck.       Acute kidney injury secondary to diuretics. Hypokalemia. Secondary to diuretic therapy Potassium has been supplemented Renal function is back to baseline     COPD (chronic obstructive pulmonary disease) (HCC) Appears to be at end-stage, palliative care consult obtained.     S/P exploratory laparotomy 04/08/23 for perforated gastric  ulcer No acute issues suspected Hemoglobin stable Continue Protonix twice daily Patient has completed a course of Augmentin which was prescribed at [facility] upon discharge.       HTN (hypertension) Continue home amlodipine 5 mg daily, losartan 25 daily     PAD (peripheral  artery disease) (HCC) left SFA CTO with failed attempt in 01/2019 for revascularization & right calcified iliac artery stenosis  Continue atorvastatin Followed at [facility]     Chronic iron deficiency anemia History of melena 11/18 secondary to perforated gastric ulcer Patient has mild iron deficiency, B12 normal.  Continue oral iron therapy.     Chronic back pain Continue Butrans patch and Dilaudid PRN     Anxiety and depression Restless leg syndrome/insomnia Xanax 0.25 daily as needed anxiety, ropinirole 1 mg 3 times daily, melatonin 3 mg nightly Patient slept better after giving Seroquel. Also added hydroxyzine as patient still has significant anxiety, could not increase dose of Xanax.    Pressure ulcers; POA Pressure Injury 05/02/23 Sacrum Stage 1 -  Intact skin with non-blanchable redness of a localized area usually over a bony prominence. (Active)  05/02/23 2039  Location: Sacrum  Location Orientation:   Staging: Stage 1 -  Intact skin with non-blanchable redness of a localized area usually over a bony prominence.  Wound Description (Comments):   Present on Admission:       Physical deconditioning PT recommended SNF which was declined by her insurance Peer-to-peer review done, discussed with medical director at home and community care transition who declines placement at this time because PT notes show that patient is doing better. Discussed recommendation with patient who has appealed the process.          Subjective: No new complaint  Physical Exam: Vitals:   05/14/23 1535 05/14/23 2024 05/14/23 2259 05/15/23 0740  BP: (!) 128/52 (!) 129/57 (!) 106/50 123/71  Pulse: 68 64 67 (!) 54  Resp: 16 18 18 18   Temp: 98 F (36.7 C) (!) 97.3 F (36.3 C) 97.8 F (36.6 C) (!) 97.5 F (36.4 C)  TempSrc:   Oral   SpO2: 100% 100% 100% 100%  Weight:      Height:       General exam: Appears calm and comfortable, chronically ill-appearing, frail Respiratory system:  Bilateral air entry.  Scattered expiratory wheezes Cardiovascular system: S1 & S2 heard, RRR. No JVD, murmurs, rubs, gallops or clicks. No pedal edema. Gastrointestinal system: Abdomen is nondistended, soft and nontender. No organomegaly or masses felt. Normal bowel sounds heard. Central nervous system: Alert and oriented x3. No focal neurological deficits. Extremities: Symmetric 5 x 5 power. Skin: No rashes, lesions or ulcers Psychiatry: Judgement and insight appear normal. Mood & affect appropriate.   Data Reviewed: Labs reviewed.  Creatinine 1.2 There are no new results to review at this time.  Family Communication: Plan of care discussed with patient in detail  Disposition: Status is: Inpatient Remains inpatient appropriate because: Awaiting discharge to SNF  Planned Discharge Destination: Skilled nursing facility    Time spent: 30 minutes  Author: Lucile Shutters, MD 05/15/2023 10:59 AM  For on call review [email or web address].

## 2023-05-15 NOTE — TOC Transition Note (Signed)
Transition of Care [facility]) - Discharge Note   Patient Details  Name: [name] MRN: [id number] Date of Birth: [birth year]  [facility] CM/SW Contact:  Allena Katz, LCSW Phone Number: 05/15/2023, 12:46 PM   Clinical Narrative:   [facility] to [facility]. Tammy notified. Medical necessity printed to unit.RN given number for report.      Final next level of care: Skilled Nursing Facility Barriers to Discharge: Barriers Resolved   Patient Goals and CMS Choice Patient states their goals for this hospitalization and ongoing recovery are:: go to [facility] CMS Medicare.gov Compare Post Acute Care list provided to:: Patient Choice offered to / list presented to : Patient      Discharge Placement                    Patient and family notified of of transfer: 05/15/23  Discharge Plan and Services Additional resources added to the After Visit Summary for                                       Social Drivers of Health (SDOH) Interventions SDOH Screenings   Food Insecurity: No Food Insecurity (05/10/2023)  Housing: Low Risk  (05/04/2023)  Transportation Needs: No Transportation Needs (05/04/2023)  Utilities: Not At Risk (05/04/2023)  Financial Resource Strain: Low Risk  (04/17/2023)   Received from [facility], [facility]  Tobacco Use: Medium Risk (05/11/2023)     Readmission Risk Interventions    05/02/2023   12:22 PM 09/22/2021    1:38 PM  Readmission Risk Prevention Plan  Transportation Screening Complete Complete  PCP or Specialist Appt within 3-5 Days Complete   HRI or Home Care Consult Complete   Social Work Consult for Recovery Care Planning/Counseling Complete   Palliative Care Screening Not Applicable   Medication Review Oceanographer) Complete Complete  HRI or Home Care Consult  Complete  SW Recovery Care/Counseling Consult  Complete  Palliative Care Screening  Not Applicable  Skilled Nursing Facility  Not  Applicable

## 2023-05-15 NOTE — TOC Progression Note (Signed)
Transition of Care [facility]) - Progression Note    Patient Details  Name: [name] MRN: [id number] Date of Birth: 1946/05/03  Transition of Care [facility]) CM/SW Contact  Allena Katz, LCSW Phone Number: 05/15/2023, 10:56 AM  Clinical Narrative:   Pt reports she has still not heard back about her appeal.         Expected Discharge Plan and Services                                               Social Determinants of Health (SDOH) Interventions SDOH Screenings   Food Insecurity: No Food Insecurity (05/10/2023)  Housing: Low Risk  (05/04/2023)  Transportation Needs: No Transportation Needs (05/04/2023)  Utilities: Not At Risk (05/04/2023)  Financial Resource Strain: Low Risk  (04/17/2023)   Received from [facility], [facility]  Tobacco Use: Medium Risk (05/11/2023)    Readmission Risk Interventions    05/02/2023   12:22 PM 09/22/2021    1:38 PM  Readmission Risk Prevention Plan  Transportation Screening Complete Complete  PCP or Specialist Appt within 3-5 Days Complete   HRI or Home Care Consult Complete   Social Work Consult for Recovery Care Planning/Counseling Complete   Palliative Care Screening Not Applicable   Medication Review Oceanographer) Complete Complete  HRI or Home Care Consult  Complete  SW Recovery Care/Counseling Consult  Complete  Palliative Care Screening  Not Applicable  Skilled Nursing Facility  Not Applicable

## 2023-06-06 ENCOUNTER — Encounter: Payer: Self-pay | Admitting: Hematology

## 2023-06-14 ENCOUNTER — Other Ambulatory Visit: Payer: Self-pay | Admitting: Cardiology

## 2023-06-14 DIAGNOSIS — L03116 Cellulitis of left lower limb: Secondary | ICD-10-CM

## 2023-07-05 ENCOUNTER — Encounter: Payer: Self-pay | Admitting: Hematology

## 2023-09-06 ENCOUNTER — Telehealth: Payer: Self-pay

## 2023-09-06 NOTE — Telephone Encounter (Signed)
 Patient calling in to schedule an appointment with the urgent care for right leg pain. Went over the urgent care walk in hours and appointment options for today (Thursday), Friday, and Saturday.   Patient verbalized understanding. States she will call her friend who drives and then will all back in to finish scheduling an urgent care appointment.

## 2023-09-06 NOTE — Telephone Encounter (Signed)
 Patient calling back in and requesting an appointment for Tuesday. Patient scheduled for Tuesday 09/11/2023 at 11:15 am for a visit to address her leg pain at the urgent care.   Patient given instructions on which door to come in and to arrive 5-10 minutes early for registration. Patient verbalized understanding.   Patient informed that should her pain increase, become severe, or start swelling she would need to be seen as soon as possible. Patient informed that if she has a concern for blood clots she would need to seek urgent medical treatment today and it is ill advised to wait until Tuesday. Patient was made aware of appointments available today and walk in hours. Patient verbalized understanding.

## 2023-09-10 ENCOUNTER — Other Ambulatory Visit: Payer: Self-pay | Admitting: Internal Medicine

## 2023-09-10 DIAGNOSIS — N644 Mastodynia: Secondary | ICD-10-CM

## 2023-09-10 DIAGNOSIS — Z1231 Encounter for screening mammogram for malignant neoplasm of breast: Secondary | ICD-10-CM

## 2023-09-11 ENCOUNTER — Ambulatory Visit
Admission: RE | Admit: 2023-09-11 | Discharge: 2023-09-11 | Disposition: A | Discharge status: Home / Self Care | Source: Ambulatory Visit | Attending: Emergency Medicine | Admitting: Emergency Medicine

## 2023-09-11 ENCOUNTER — Telehealth: Payer: Self-pay | Admitting: Internal Medicine

## 2023-09-11 ENCOUNTER — Ambulatory Visit (INDEPENDENT_AMBULATORY_CARE_PROVIDER_SITE_OTHER)

## 2023-09-11 VITALS — BP 152/69 | HR 64 | Temp 97.8°F | Resp 16

## 2023-09-11 DIAGNOSIS — G8929 Other chronic pain: Secondary | ICD-10-CM

## 2023-09-11 DIAGNOSIS — M25461 Effusion, right knee: Secondary | ICD-10-CM

## 2023-09-11 DIAGNOSIS — M25561 Pain in right knee: Secondary | ICD-10-CM

## 2023-09-11 NOTE — Discharge Instructions (Addendum)
 You may have a small bone chip off of your distal femur in the area  where you are having pain, but I am not sure how long it has been there. you appear to have some arthritis.  Will contact you if the radiology overread differs enough from mine and we need to change management.  In the meantime, continue ice, Tylenol  as you have been taking it.  Try the Voltaren gel, but apply it on a regular basis.  It takes several days for it to start having effects.  We can try the Salonpas patches as well.  Please follow-up with orthopedics if not better in a week to 10 days.

## 2023-09-11 NOTE — ED Provider Notes (Signed)
 HPI  SUBJECTIVE:  [name] is a 78 y.o. female who presents with 3 months of posterior/medial knee pain that she describes as sharp, throbbing, intermittent.  States it is increasing in frequency, lasting from minutes to hours.  No new or different calf swelling, new lower extremity color changes.  No fevers, erythema.  She tried ice, heat, elevation, Tylenol  every 8 hours without improvement in her symptoms.  Symptoms are worse with sitting for too long with her knee bent and for walking around for prolonged periods of time.  She has never had symptoms like this before.  She has a past medical history of chronic kidney disease stage III, breast cancer, colon cancer, COPD, GI bleed, hyperlipidemia, hypertension, subdural hematoma, opiate use disorder, peripheral arterial disease, CHF.  PCP: [facility].  Additional history obtained from family member.  Past Medical History:  Diagnosis Date   Acute renal failure superimposed on stage 3 chronic kidney disease (HCC) 10/27/2014   Back pain, chronic    Breast cancer (HCC) 09/09/2010   Cancer of colon (HCC) 05/24/2011   Centrilobular emphysema (HCC) 07/15/2018   CKD (chronic kidney disease), stage III (HCC)    Claudication, intermittent (HCC) 07/15/2018   Depression    GIB (gastrointestinal bleeding)    H/O tobacco use, presenting hazards to health Quit Dec 2019 05/03/2011   50 years, up to 3PPD quit last year.    Hx of varicose veins of lower extremity 07/15/2018   Hyperlipidemia    Hypertension    SDH (subdural hematoma) (HCC)    SOB (shortness of breath) 07/15/2018    Past Surgical History:  Procedure Laterality Date   ABDOMINAL AORTOGRAM W/LOWER EXTREMITY Left 03/18/2019   Procedure: ABDOMINAL AORTOGRAM W/LOWER EXTREMITY;  Surgeon: Knox Perl, MD;  Location: [facility] INVASIVE CV LAB;  Service: Cardiovascular;  Laterality: Left;   BACK SURGERY     BIOPSY  09/20/2021   Procedure: BIOPSY;  Surgeon: Baldo Bonds, MD;   Location: [facility] ENDOSCOPY;  Service: Gastroenterology;;   BREAST LUMPECTOMY Right 10/10/2010   COLON SURGERY  04/2011   COLONOSCOPY  05/03/2011   Procedure: COLONOSCOPY;  Surgeon: Mathew Solomon, MD;  Location: [facility] ENDOSCOPY;  Service: Endoscopy;  Laterality: N/A;   ESOPHAGOGASTRODUODENOSCOPY (EGD) WITH PROPOFOL  N/A 09/20/2021   Procedure: ESOPHAGOGASTRODUODENOSCOPY (EGD) WITH PROPOFOL ;  Surgeon: Baldo Bonds, MD;  Location: [facility] ENDOSCOPY;  Service: Gastroenterology;  Laterality: N/A;   JOINT REPLACEMENT     R&L total shoulder replacements   LUMBAR DISC SURGERY     PARTIAL HYSTERECTOMY     PERIPHERAL VASCULAR INTERVENTION  03/18/2019   Procedure: PERIPHERAL VASCULAR INTERVENTION;  Surgeon: Knox Perl, MD;  Location: [facility] INVASIVE CV LAB;  Service: Cardiovascular;;  attempted left SFA   SHOULDER SURGERY     TUMOR REMOVAL  Tumor removed R breast    Family History  Problem Relation Age of Onset   Diabetes Mother    Hypertension Mother    Cancer Mother        leukemia   Sudden death Mother    Heart attack Mother    Anesthesia problems Neg Hx    Hypotension Neg Hx    Malignant hyperthermia Neg Hx    Pseudochol deficiency Neg Hx    Breast cancer Neg Hx    Lung disease Neg Hx     Social History   Tobacco Use   Smoking status: Former    Current packs/day: 0.00    Average packs/day: 0.3 packs/day for 30.0 years (7.5 ttl pk-yrs)  Types: Cigarettes    Start date: 10/30/1989    Quit date: 10/31/2019    Years since quitting: 3.8   Smokeless tobacco: Never   Tobacco comments:    had 6 cigarettes recently 03-10-20  Vaping Use   Vaping status: Never Used  Substance Use Topics   Alcohol use: Yes    Comment: rarely    Drug use: No    No current facility-administered medications for this encounter.  Current Outpatient Medications:    acetaminophen  (TYLENOL ) 650 MG CR tablet, Take 650 mg by mouth every 8 (eight) hours as needed for pain., Disp: , Rfl:    amLODipine  (NORVASC ) 5 MG  tablet, Take 1 tablet (5 mg total) by mouth daily., Disp: 90 tablet, Rfl: 3   atorvastatin  (LIPITOR) 10 MG tablet, Take 1 tablet (10 mg total) by mouth daily., Disp: 90 tablet, Rfl: 2   budesonide -formoterol  (SYMBICORT ) 160-4.5 MCG/ACT inhaler, Inhale 2 puffs into the lungs daily., Disp: 1 each, Rfl: 12   Cholecalciferol  25 MCG (1000 UT) TBDP, Take 1,000 Units by mouth daily., Disp: , Rfl:    cyanocobalamin  (VITAMIN B12) 1000 MCG tablet, Take 1,000 mcg by mouth daily., Disp: , Rfl:    gabapentin  (NEURONTIN ) 300 MG capsule, Take 1 capsule (300 mg total) by mouth 3 (three) times daily. (Patient taking differently: Take 600 mg by mouth 3 (three) times daily.), Disp: 270 capsule, Rfl: 3   ipratropium-albuterol  (DUONEB) 0.5-2.5 (3) MG/3ML SOLN, Take 3 mLs by nebulization every 6 (six) hours as needed., Disp: 360 mL, Rfl: 6   iron  polysaccharides (NIFEREX) 150 MG capsule, Take 1 capsule (150 mg total) by mouth daily., Disp: 30 capsule, Rfl: 0   losartan  (COZAAR ) 25 MG tablet, Take 25 mg by mouth daily., Disp: , Rfl:    Multiple Vitamins-Minerals (MULTIVITAMIN ADULT EXTRA C) CHEW, , Disp: , Rfl:    potassium chloride  (KLOR-CON ) 10 MEQ tablet, Take 1 tablet (10 mEq total) by mouth daily as needed (With Furosemide ). Take with lasix , Disp: 90 tablet, Rfl: 3   QUEtiapine  (SEROQUEL ) 25 MG tablet, Take 1 tablet (25 mg total) by mouth at bedtime., Disp: 30 tablet, Rfl: 0   rOPINIRole  (REQUIP ) 1 MG tablet, Take 1 tablet (1 mg total) by mouth 3 (three) times daily. (Patient taking differently: Take 1.5 mg by mouth 3 (three) times daily.), Disp: 90 tablet, Rfl: 2   spironolactone  (ALDACTONE ) 25 MG tablet, Take 1 tablet (25 mg total) by mouth daily., Disp: 30 tablet, Rfl: 0   torsemide  (DEMADEX ) 20 MG tablet, Take 1 tablet (20 mg total) by mouth daily., Disp: 30 tablet, Rfl: 0  No Known Allergies   ROS  As noted in HPI.   Physical Exam  BP (!) 152/69 (BP Location: Left Arm)   Pulse 64   Temp 97.8 F (36.6  C) (Oral)   Resp 16   SpO2 95%   Constitutional: Well developed, well nourished, no acute distress Eyes:  EOMI, conjunctiva normal bilaterally HENT: Normocephalic, atraumatic,mucus membranes moist Respiratory: Normal inspiratory effort Cardiovascular: Normal rate GI: nondistended skin: No rash, skin intact.  Bilateral lower extremity rubor. Musculoskeletal: R Knee ROM baseline for Pt, Flexion intact , Patella NT, Patellar tendon NT, Medial joint tender, Lateral joint NT , Popliteal region NT, Varus MCL stress testing stable, Valgus LCL stress testing stable, ACL/PCL stable, McMurray negative,  Distal NVI with intact baseline sensation / motor / pulse distal to knee.  DP 1+.  No obvious effusion. No erythema. No increased temperature. No crepitus.  Tenderness along right medial thigh.  No palpable cord. No tenderness along posterior right calf. Right calf 35 cm left calf 36 cm.  Bilateral trace edema. Neurologic: Alert & oriented x 3, no focal neuro deficits Psychiatric: Speech and behavior appropriate   ED Course   Medications - No data to display  Orders Placed This Encounter  Procedures   DG Knee Complete 4 Views Right    Standing Status:   Standing    Number of Occurrences:   1    Reason for Exam (SYMPTOM  OR DIAGNOSIS REQUIRED):   Medial knee pain for 3 months rule out any acute changes    No results found for this or any previous visit (from the past 24 hours). DG Knee Complete 4 Views Right Result Date: 09/11/2023 CLINICAL DATA:  Medial knee pain for 3 months. No reported history of trauma. EXAM: RIGHT KNEE - COMPLETE 4 VIEW COMPARISON:  X-ray 08/21/2014. FINDINGS: Chondrocalcinosis. Small osteophytes seen of all 3 compartments. No fracture or dislocation. Osteopenia. Vascular calcifications seen posterior to the knee on lateral view. Small joint effusion. IMPRESSION: Truck metal degenerative changes. Chondrocalcinosis. Joint effusion. Please correlate for the etiology of  the effusion as there is a differential. Please correlate for any clinical evidence of infection or other process. Electronically Signed   By: [name] M.D.   On: 09/11/2023 13:57    ED Clinical Impression  1. Chronic pain of right knee   2. Knee effusion, right      ED Assessment/Plan   {The patient has been seen in Urgent Care in the last 3 years. :1}  Most likely arthritis given this is 3 months of posterior/medial knee pain.  DVT, acute vascular arterial insufficiency low in the differential.  Suspect arthritis.  Will x-ray right knee.  Reviewed imaging independently.  Arthritis. Small bone fragment off of medial femur of indeterminate age.  Formal radiology overread pending.  Discussed this with patient.  Will contact her at [phone] if radiology overread differs enough from mine and we need to change management.   Reviewed radiology report.  Degenerative changes.  Chondrocalcinosis.  Small joint effusion.   See radiology report for full details.  Will have her continue Tylenol  as she has been taking it since she states that has been cleared by her physicians, start using Voltaren topical gel on a regular basis.  Salonpas patches.  Gave patient a sample here.  Discussed with her and daughter no NSAIDs because of recent GI bleed that required emergency surgery.    ER return precautions given to daughter and patient.  Discussed effusion findings with patient over phone.  Advised that this needs to be drained and evaluated by specialist.  She is amenable to this.  Discussed with Dr. Augustus Bean, sports medicine.  He will be happy to see her for further evaluation and possible arthrocentesis.  Patient has transportation issues.  The office will contact her and coordinate an appointment sometime this week.  Discussed imaging, MDM, treatment plan, and plan for follow-up with patient. Discussed sn/sx that should prompt return to the ED. patient agrees with plan.   No orders of the  defined types were placed in this encounter.     *This clinic note was created using Dragon dictation software. Therefore, there may be occasional mistakes despite careful proofreading.  ?

## 2023-09-11 NOTE — ED Triage Notes (Signed)
 Pt presents with bilateral leg pain that starts at her thigh and radiates down to her ankle. Her most consistent pain starts at the thigh and around the back of her knees. She takes Tylenol  Arthritis for the pain

## 2023-09-11 NOTE — Telephone Encounter (Signed)
 Patient would like a call to ask questions about her procedure. Patient is asking about downtime, and wants to know what was seen maybe from her xrays. Patient mentioned chips being in the fluid.

## 2023-09-12 NOTE — Telephone Encounter (Signed)
 Please advise.   JM

## 2023-09-17 ENCOUNTER — Encounter: Payer: Self-pay | Admitting: Family Medicine

## 2023-09-17 ENCOUNTER — Ambulatory Visit (INDEPENDENT_AMBULATORY_CARE_PROVIDER_SITE_OTHER): Admitting: Family Medicine

## 2023-09-17 ENCOUNTER — Other Ambulatory Visit (INDEPENDENT_AMBULATORY_CARE_PROVIDER_SITE_OTHER): Payer: Self-pay | Admitting: Radiology

## 2023-09-17 VITALS — HR 6 | Ht <= 58 in | Wt 123.8 lb

## 2023-09-17 DIAGNOSIS — M1711 Unilateral primary osteoarthritis, right knee: Secondary | ICD-10-CM

## 2023-09-17 MED ORDER — TRIAMCINOLONE ACETONIDE 40 MG/ML IJ SUSP
40.0000 mg | Freq: Once | INTRAMUSCULAR | Status: AC
  Administered 2023-09-17: 40 mg via INTRAMUSCULAR

## 2023-09-17 NOTE — Progress Notes (Signed)
 Primary Care / Sports Medicine Office Visit  Patient Information:  Patient ID: [name]  ONIYA LOERA, female DOB: 1946/03/20 Age: 78 y.o. MRN: [id number]   Holly  S Bean is a pleasant 78 y.o. female presenting with the following:  Chief Complaint  Patient presents with   Knee Pain    Right knee pain on the posterior aspect of knee x 3 months. The pain radiates through the bottom of her leg , Patient was told she has fluid on her knee. She would like to get it aspirated if possible.     Vitals:   09/17/23 1333  Pulse: (!) 6  SpO2: 93%   Vitals:   09/17/23 1333  Weight: 123 lb 12.8 oz (56.2 kg)  Height: 4\' 9"  (1.448 m)   Body mass index is 26.79 kg/m.  DG Knee Complete 4 Views Right Result Date: 09/11/2023 CLINICAL DATA:  Medial knee pain for 3 months. No reported history of trauma. EXAM: RIGHT KNEE - COMPLETE 4 VIEW COMPARISON:  X-ray 08/21/2014. FINDINGS: Chondrocalcinosis. Small osteophytes seen of all 3 compartments. No fracture or dislocation. Osteopenia. Vascular calcifications seen posterior to the knee on lateral view. Small joint effusion. IMPRESSION: Truck metal degenerative changes. Chondrocalcinosis. Joint effusion. Please correlate for the etiology of the effusion as there is a differential. Please correlate for any clinical evidence of infection or other process. Electronically Signed   By: Adrianna Horde M.D.   On: 09/11/2023 13:57     Independent interpretation of notes and tests performed by another provider:   See below  Procedures performed:   Procedure:  Injection following aspiration of right knee under ultrasound guidance. Ultrasound guidance utilized for suprapatellar lateral approach, 2+ effusion noted, confirmation of full aspiration sonographically noted Samsung HS60 device utilized with permanent recording / reporting. Verbal informed consent obtained and verified. Skin prepped in a sterile fashion. Ethyl chloride for topical local analgesia.   Completed without difficulty and tolerated well. Aspirate: 33 mL synovial fluid Medication: triamcinolone acetonide 40 mg/mL suspension for injection 1 mL total and 10 mL lidocaine  1% without epinephrine  utilized for needle placement anesthetic Advised to contact for fevers/chills, erythema, induration, drainage, or persistent bleeding.   Pertinent History, Exam, Impression, and Recommendations:   Problem List Items Addressed This Visit     Primary osteoarthritis of one knee, right - Primary   History of Present Illness Ms. Holly  Laneta Pintos Bean is a 78 year old female who presents with right knee pain and swelling.  She has been experiencing right knee pain and swelling for approximately three months. The pain is severe, especially when putting pressure on the knee, such as stepping down or using stairs. It is primarily located in the front of the knee but can radiate down and up into the thigh. No recent changes in activity or specific events have been identified as triggers for the symptoms.  Swelling in the right knee was identified after an x-ray. She has not experienced similar symptoms in the past. She has used Voltaren, a topical anti-inflammatory, but it has not provided significant relief.  She uses a rollator at home and is on oxygen , which she mentions in the context of her mobility and overall health. She has a history of receiving cortisone injections in her back and right hip for pain management.   Physical Exam INSPECTION: Trace suprapatellar swelling in the right knee, no ecchymosis or erythema. PALPATION: 2+ effusion in the right knee. Lateral and medial patellar facet tenderness. Symmetric medial and lateral joint  line tenderness. RANGE OF MOTION: ROM 0-90 degrees limited by significant pain. SPECIAL TESTS: No ligamentous laxity with valgus, varus, anterior, and posterior stress tests.  Results Procedure: Knee Joint Aspiration and Corticosteroid Injection Description:  Ultrasound-guided aspiration of right knee joint. Approximately 30 cc of fluid aspirated. Injection of 2 cc of corticosteroid and 10 cc of lidocaine  into the knee joint. Informed Consent: Discussion of risks, benefits, and alternatives to the procedure. Patient informed about the use of ultrasound guidance, potential discomfort, and the use of lidocaine  for anesthesia. Advised to rest and apply ice post-procedure.  RADIOLOGY Right knee X-ray: Tricompartmental degenerative changes, chondrocalcinosis, effusion.  Assessment and Plan Right knee symptomatic osteoarthritis with effusion  Chronic effusion with significant pain post-sprain. Inflammation affects all knee compartments. Informed consent obtained for ultrasound-guided aspiration and cortisone injection. - Perform ultrasound-guided aspiration of knee effusion. - Administer cortisone injection into the knee joint. - Advise relative rest for two days post-procedure. - Instruct to apply ice to the knee to prevent flare-up. - Continue Tylenol  and Voltaren for pain management, avoiding application near the injection site until healed. - Provide written instructions for post-procedure care. - Instruct to report progress in two weeks; no news is considered good news, but further investigation will be needed if symptoms persist.      Relevant Medications   buprenorphine  (BUTRANS ) 10 MCG/HR PTWK   Other Relevant Orders   US  LIMITED JOINT SPACE STRUCTURES LOW RIGHT     Orders & Medications Medications:  Meds ordered this encounter  Medications   triamcinolone acetonide (KENALOG-40) injection 40 mg   Orders Placed This Encounter  Procedures   US  LIMITED JOINT SPACE STRUCTURES LOW RIGHT     No follow-ups on file.     Ma Saupe, MD, [facility]   Primary Care Sports Medicine Primary Care and Sports Medicine at [facility]

## 2023-09-17 NOTE — Patient Instructions (Signed)
 You have just been given a cortisone injection to reduce pain and inflammation. After the injection you may notice immediate relief of pain as a result of the Lidocaine . It is important to rest the area of the injection for 24 to 48 hours after the injection. There is a possibility of some temporary increased discomfort and swelling for up to 72 hours until the cortisone begins to work. If you do have pain, simply rest the joint and use ice. If you can tolerate over the counter medications, you can try Tylenol  for added relief per package instructions.   Patient Plan  Right Knee Effusion with Pain:  1. Continue taking Tylenol  and using Voltaren for pain relief. Do not apply Voltaren near the injection site until it has healed. 2. Follow the written instructions provided for post-procedure care. 3. Report your progress in two weeks. If symptoms persist, further investigation may be needed.  Red Flags: - Contact your healthcare provider if you experience increased pain, swelling, redness, or fever.

## 2023-09-17 NOTE — Assessment & Plan Note (Signed)
 History of Present Illness [name] is a 78 year old female who presents with right knee pain and swelling.  She has been experiencing right knee pain and swelling for approximately three months. The pain is severe, especially when putting pressure on the knee, such as stepping down or using stairs. It is primarily located in the front of the knee but can radiate down and up into the thigh. No recent changes in activity or specific events have been identified as triggers for the symptoms.  Swelling in the right knee was identified after an x-ray. She has not experienced similar symptoms in the past. She has used Voltaren, a topical anti-inflammatory, but it has not provided significant relief.  She uses a rollator at home and is on oxygen , which she mentions in the context of her mobility and overall health. She has a history of receiving cortisone injections in her back and right hip for pain management.   Physical Exam INSPECTION: Trace suprapatellar swelling in the right knee, no ecchymosis or erythema. PALPATION: 2+ effusion in the right knee. Lateral and medial patellar facet tenderness. Symmetric medial and lateral joint line tenderness. RANGE OF MOTION: ROM 0-90 degrees limited by significant pain. SPECIAL TESTS: No ligamentous laxity with valgus, varus, anterior, and posterior stress tests.  Results Procedure: Knee Joint Aspiration and Corticosteroid Injection Description: Ultrasound-guided aspiration of right knee joint. Approximately 30 cc of fluid aspirated. Injection of 2 cc of corticosteroid and 10 cc of lidocaine  into the knee joint. Informed Consent: Discussion of risks, benefits, and alternatives to the procedure. Patient informed about the use of ultrasound guidance, potential discomfort, and the use of lidocaine  for anesthesia. Advised to rest and apply ice post-procedure.  RADIOLOGY Right knee X-ray: Tricompartmental degenerative changes, chondrocalcinosis,  effusion.  Assessment and Plan Right knee symptomatic osteoarthritis with effusion  Chronic effusion with significant pain post-sprain. Inflammation affects all knee compartments. Informed consent obtained for ultrasound-guided aspiration and cortisone injection. - Perform ultrasound-guided aspiration of knee effusion. - Administer cortisone injection into the knee joint. - Advise relative rest for two days post-procedure. - Instruct to apply ice to the knee to prevent flare-up. - Continue Tylenol  and Voltaren for pain management, avoiding application near the injection site until healed. - Provide written instructions for post-procedure care. - Instruct to report progress in two weeks; no news is considered good news, but further investigation will be needed if symptoms persist.

## 2023-09-18 ENCOUNTER — Ambulatory Visit: Payer: Self-pay

## 2023-09-18 NOTE — Telephone Encounter (Signed)
 Pt was in the office yesterday and her medication list was not updated. She provided the following info:        1. gabapentin  (NEURONTIN ) 300 MG capsule, she does not take this, but takes the 600mg  capsules 3xs a day    2. losartan  (COZAAR ) 25 MG tablet, no longer taking this but takes losartan  HCTZ 100-12.5mg  one a day    3. QUEtiapine  (SEROQUEL ) 25 MG tablet, does not take this medication    4. rOPINIRole  (REQUIP ) 1 MG tablet she takes this but also takes rOPINIRole  2mg  both 3xs a day.    5. She does take the Vitamin D  and cyanocobalamin  (VITAMIN B12) 1000 MCG tablet.                          Copied from CRM [id number]. Topic: Clinical - Medication Question >> Sep 18, 2023  8:25 AM Opal Bill wrote: Reason for CRM: Pt was in the office yesterday and her medication list was not updated. She provided the following info:  1. gabapentin  (NEURONTIN ) 300 MG capsule, she does not take this, but takes the 600mg  capsules 3xs a day 2. losartan  (COZAAR ) 25 MG tablet, no longer taking this but takes losartan  HCTZ 100-12.5mg  one a day 3. QUEtiapine  (SEROQUEL ) 25 MG tablet, does not take this medication 4. rOPINIRole  (REQUIP ) 1 MG tablet she takes this but also takes rOPINIRole  2mg  both 3xs a day.  5. She does take the Vitamin D  and cyanocobalamin  (VITAMIN B12) 1000 MCG tablet.  Please update med list.

## 2023-09-19 ENCOUNTER — Ambulatory Visit

## 2023-09-24 ENCOUNTER — Encounter

## 2023-09-24 ENCOUNTER — Other Ambulatory Visit

## 2023-10-01 ENCOUNTER — Other Ambulatory Visit: Payer: Self-pay | Admitting: Internal Medicine

## 2023-10-01 DIAGNOSIS — Z13820 Encounter for screening for osteoporosis: Secondary | ICD-10-CM

## 2023-10-04 ENCOUNTER — Ambulatory Visit
Admission: RE | Admit: 2023-10-04 | Discharge: 2023-10-04 | Disposition: A | Discharge status: Home / Self Care | Source: Ambulatory Visit | Attending: Internal Medicine | Admitting: Internal Medicine

## 2023-10-04 DIAGNOSIS — N644 Mastodynia: Secondary | ICD-10-CM

## 2023-12-06 ENCOUNTER — Ambulatory Visit: Payer: Self-pay

## 2023-12-06 NOTE — Telephone Encounter (Signed)
 FYI Only or Action Required?: Action required by provider: patient will call back to make an appointment-patient has to figure out a ride.  Patient was last seen in primary care on 09/17/2023 by [name], MD.  Called Nurse Triage reporting Knee Pain.  Symptoms began several weeks ago.  Interventions attempted: Rest, hydration, or home remedies and Ice/heat application.  Symptoms are: unchanged.  Triage Disposition: See PCP When Office is Open (Within 3 Days)-patient will call back to E2C2 to make an appointment   Patient/caregiver understands and will follow disposition?: No, wishes to speak with PCP Copied from CRM [id number]. Topic: Clinical - Red Word Triage >> Dec 06, 2023  9:33 AM Turkey B wrote: [facility] that prompted transfer to Nurse Triage: pt has severe pain in left knee Reason for Disposition  [1] MODERATE pain (e.g., interferes with normal activities, limping) AND [2] present > 3 days  Answer Assessment - Initial Assessment Questions 1. LOCATION and RADIATION: Where is the pain located?      Right knee 2. QUALITY: What does the pain feel like?  (e.g., sharp, dull, aching, burning)     Sharp-patient reports extreme pain when bending that knee trying to get up from a chair.  3. SEVERITY: How bad is the pain? What does it keep you from doing?   (Scale 1-10; or mild, moderate, severe)     Reports 6 out of 10 pain currently 4. ONSET: When did the pain start? Does it come and go, or is it there all the time?     Started two weeks ago.  5. RECURRENT: Have you had this pain before? If Yes, ask: When, and what happened then?     Yes-same pain occurred back in April of this year 6. SETTING: Has there been any recent work, exercise or other activity that involved that part of the body?      no 7. AGGRAVATING FACTORS: What makes the knee pain worse? (e.g., walking, climbing stairs, running)     Bending her leg at the knee 8. ASSOCIATED SYMPTOMS: Is  there any swelling or redness of the knee?     Swelling-patient states swelling is the size of a golf ball. Patient states the swelling will increase throughout the day.  9. OTHER SYMPTOMS: Do you have any other symptoms? (e.g., calf pain, chest pain, difficulty breathing, fever)     No  Patient was seen by Dr. Alvia in April of this year for same symptoms of right knee pain along with fluid to her knee. Patient is asking to be seen again by Dr. Alvia.  Protocols used: Knee Pain-A-AH

## 2023-12-11 ENCOUNTER — Ambulatory Visit: Payer: Self-pay | Admitting: *Deleted

## 2023-12-11 NOTE — Progress Notes (Signed)
 [facility] Primary Care at [facility] Note: [name], [place] [address]. Phone [phone]  12/11/2023  Patient Name:   [name]  MRN: [id number]  Demographics:    Age-  78 y.o.     Date of Birth-  [birth year]  Chief complaint (CC):   Chief Complaint  Patient presents with  . Follow-up    Here to look at her right ankle injury    Assessment/Plan:  Pleasant 78 y.o. female with:  Assessment & Plan Leg laceration above right ankle -Superficial leg laceration above right ankle.  -Healing with occasional clear drainage by second intention.  - Mild erythema around wound and patient was treated with doxycycline  100 mg po BID X 10 days with improvement in wound per patient.  -No infection signs today. Denies purulent drainage, fever, pain or temp > 100.6 - Prescribed doxycycline  100 mg BID for 10 days. - Follow-up in two weeks to reassess wound. - Advised monitoring for infection signs and cancel follow-up if healing continues. - Explained sharp pain due to nerve involvement, expected to subside with healing. - Return precautions discussed including erythema or pus draining from wound or fever.  - Patient is schedule for wound recheck in 2 weeks - Patient declined referral to wound care clinic.   Diagnosis ICD-10-CM Associated Orders  1. Superficial laceration of ankle  S91.019A     2. Cellulitis of right lower extremity  L03.115 doxycycline  (VIBRAMYCIN ) 100 MG capsule      I personally spent 25 minutes face-to-face and non-face-to-face in the care of this patient, which includes all pre, intra, and post visit time on the date of service.  All documented time was specific to the E/M visit and does not include any procedures that may have been performed. We discussed medical, dietary, lifestyle, and health maintenance modifications to optimize health. Standard precautions followed during visit. Medication adherence and barriers to the treatment plan have been addressed.  Patient  voiced understanding, needs F/U visit 2 weeks.   Health Maintenance:  Health Maintenance Due  Topic Date Due  . COPD Spirometry  Never done  . DEXA Scan  Never done  . Pneumococcal Vaccine 50+ (1 of 2 - PCV) Never done  . DTaP/Tdap/Td Vaccines (1 - Tdap) Never done  . Zoster Vaccines (1 of 2) Never done  . COVID-19 Vaccine (6 - Pfizer risk 2024-25 season) 10/02/2023    Subjective:  History of present illness (HPI):    History of Present Illness Holly  Bean is a 78 year old female who presents with a laceration on her leg sustained from a rollator accident which occurred a month ago. She is accompanied by her sister, Brad.  Approximately one month ago, she sustained a laceration on her right annkle after an incident involving her rollator. The injury occurred when she swung the rollator around and noticed her slipper felt wet, indicating bleeding. Initially, there was a debate between emergency responders about the need for stitches, but ultimately, she did not receive any. The wound continued to bleed the following day, prompting her to seek medical attention.  The wound has been draining, with a clear or white discharge noted last night. She has not been applying any topical treatments recently, although she initially used Vaseline. She completed a course of doxycycline , taken twice daily, which was prescribed last week  and patient notes improvement is wound with it getting smaller and less pain. Still sees intermittent clear drainage from site but no pus.   She experiences intermittent sharp, electric-like  pain at the site of the laceration, which she attributes to possible nerve involvement. This sensation is described as 'feels like some electricity is going through me.  She is concerned about the wound's appearance, noting that others have commented on it looking 'gross' due to its open nature. She believes it is healing, albeit slowly. The discharge is clear, and there is no  fever or other systemic symptoms.  Denies HA, fever, chest pain, shortness of breath, N/V/D, bowel or bladder issues, vision or hearing changes, or swelling.   Relevant ROS: Reviewed 12 systems, positive findings listed, all others negative.  Pertinent Past Med Hx:  Past Medical History[1]  Medications:   Current Medications[2]   Allergies:  Allergies[3]  Pertinent Social Hx and Habits: EMR reviewed  Pertinent Family Hx: EMR reviewed  Objective:    BP Readings from Last 3 Encounters:  12/11/23 122/80  11/26/23 162/80  10/01/23 132/74     Vitals:   12/11/23 1113  BP: 122/80  BP Site: R Arm  BP Position: Sitting  BP Cuff Size: Medium  Pulse: 74  Temp: 36.3 C (97.4 F)  TempSrc: Temporal  SpO2: 96%  Weight: 56.7 kg (125 lb)     Physical Exam:  Physical Exam SKIN: Skin wound healing, no signs of infection.  General Appearance: WDWN in NAD     Skin: Right lateral ankle with longitudinal irregular wound that is healing by second intention without erythema, drainage tenderness.  HEENT: PERRLA, EOMI, TM's clear Respiratory: Clear throughout Cardio: RRR Abdomen: Soft and non-tnder Neurologic: A & O X 4, Grossly intact, stable gait PSYCH: Behavior calm and cooperative  Diagnostics:   Results   Lab Results  Component Value Date   WBC 4.6 05/31/2023   HGB 10.1 (L) 05/31/2023   HCT 31.4 (L) 05/31/2023   PLT 303 05/31/2023    Lab Results  Component Value Date   NA 142 07/25/2023   K 4.1 07/25/2023   CL 99 07/25/2023   CO2 34.0 (H) 07/25/2023   BUN 28 (H) 07/25/2023   CREATININE 0.99 07/25/2023   GLU 95 07/25/2023   CALCIUM  11.6 (H) 07/25/2023   MG 2.1 04/27/2023   PHOS 3.1 04/27/2023    Lab Results  Component Value Date   BILITOT 0.6 04/09/2023   BILIDIR 0.20 04/09/2023   PROT 5.5 (L) 04/09/2023   ALBUMIN 3.3 (L) 04/09/2023   ALT 71 (H) 04/09/2023   AST 79 (H) 04/09/2023   ALKPHOS 43 (L) 04/09/2023    Lab Results  Component Value Date    PT 11.2 04/09/2023   INR 0.98 04/09/2023   APTT 25.5 04/09/2023     TSH  Date Value Ref Range Status  10/02/2022 0.840 0.550 - 4.780 uIU/mL Final      Slater MARLA Sole, MD             [1] Past Medical History: Diagnosis Date  . Arthritis   . Breast cancer      . Colon cancer      . COPD (chronic obstructive pulmonary disease)      . Hyperlipidemia   . Hypertension   [2]  Current Outpatient Medications:  .  acetaminophen  (TYLENOL ) 500 MG tablet, Take 2 tablets (1,000 mg total) by mouth every eight (8) hours. (Patient taking differently: Take 2 tablets (1,000 mg total) by mouth every eight (8) hours. PRN), Disp: 30 tablet, Rfl: 0 .  amlodipine  (NORVASC ) 5 MG tablet, Take 1 tablet (5 mg total) by mouth daily. TAKE 1  TABLET (5 MG TOTAL) BY MOUTH DAILY., Disp: 30 tablet, Rfl: 3 .  atorvastatin  (LIPITOR) 10 MG tablet, Take 1 tablet (10 mg total) by mouth daily., Disp: 90 tablet, Rfl: 0 .  budesonide -formoterol  (SYMBICORT ) 160-4.5 mcg/actuation inhaler, Inhale 2 puffs  in the morning and 2 puffs in the evening. (Patient taking differently: Inhale 2 puffs in the morning and 2 puffs in the evening. As needed.), Disp: 10.2 g, Rfl: 1 .  buprenorphine  10 mcg/hour PTWK transdermal patch, Place 1 patch on the skin every seven (7) days. (Patient taking differently: Place 1 patch on the skin every seven (7) days. PRN), Disp: 4 patch, Rfl: 0 .  cholecalciferol , vitamin D3-25 mcg, 1,000 unit,, 25 mcg (1,000 unit) capsule, Take 1 capsule (25 mcg total) by mouth., Disp: , Rfl:  .  cyanocobalamin  (VITAMIN B-12) 250 MCG tablet, Take 1 tablet (250 mcg total) by mouth daily., Disp: , Rfl:  .  furosemide  (LASIX ) 20 MG tablet, Take 1 tablet (20 mg total) by mouth daily. PRN (Patient taking differently: Take 1 tablet (20 mg total) by mouth in the morning. PRN Sometimes 5 a week.), Disp: 90 tablet, Rfl: 0 .  gabapentin  (NEURONTIN ) 600 MG tablet, Take 1 tablet (600 mg total) by mouth Three (3) times a day.,  Disp: 270 tablet, Rfl: 0 .  ipratropium (ATROVENT ) 0.02 % nebulizer solution, Inhale 2.5 mL (500 mcg total)., Disp: , Rfl:  .  losartan  (COZAAR ) 100 MG tablet, Take 1 tablet (100 mg total) by mouth daily., Disp: 100 tablet, Rfl: 3 .  miscellaneous medical supply Misc, Rollator, Disp: 1 each, Rfl: 0 .  multivitamin with minerals tablet, Take 1 tablet by mouth daily., Disp: , Rfl:  .  rOPINIRole  (REQUIP ) 3 MG tablet, Take 1 tablet (3 mg total) by mouth Three (3) times a day as needed. (Patient taking differently: Take 1 tablet (3 mg total) by mouth Three (3) times a day as needed. 3 a day), Disp: 270 tablet, Rfl: 1 .  spironolactone  (ALDACTONE ) 25 MG tablet, Take 1 tablet (25 mg total) by mouth daily., Disp: 100 tablet, Rfl: 3 .  triamcinolone  (KENALOG ) 0.5 % cream, Apply cream to affected area BID as needed for rash (Patient not taking: Reported on 12/11/2023), Disp: 454 g, Rfl: 0 [3] No Known Allergies

## 2023-12-11 NOTE — Telephone Encounter (Signed)
 Reason for Disposition  [1] MODERATE pain (e.g., interferes with normal activities, limping) AND [2] present > 3 days  Answer Assessment - Initial Assessment Questions 1. LOCATION and RADIATION: Where is the pain located?      It's the right knee.   It looks like the first time I saw Dr. Alvia.   It's full of fluid.    2. QUALITY: What does the pain feel like?  (e.g., sharp, dull, aching, burning)     It's there all the time.   It's uncomfortable with my legs crossed.   I have to keep it stretched out.  3. SEVERITY: How bad is the pain? What does it keep you from doing?   (Scale 1-10; or mild, moderate, severe)      Severe pain 4. ONSET: When did the pain start? Does it come and go, or is it there all the time?     He told me if I had more problems to call hin back. 5. RECURRENT: Have you had this pain before? If Yes, ask: When, and what happened then?     Yes I've been seen for this already. 6. SETTING: Has there been any recent work, exercise or other activity that involved that part of the body?      Already been seen by Dr Alvia. 7. AGGRAVATING FACTORS: What makes the knee pain worse? (e.g., walking, climbing stairs, running)     I have to keep the knee straight. 8. ASSOCIATED SYMPTOMS: Is there any swelling or redness of the knee?     Not asked 9. OTHER SYMPTOMS: Do you have any other symptoms? (e.g., calf pain, chest pain, difficulty breathing, fever)     Not asked 10. PREGNANCY: Is there any chance you are pregnant? When was your last menstrual period?       N/A  Protocols used: Knee Pain-A-AH FYI Only or Action Required?: FYI only for provider.  Patient was last seen in primary care on 09/17/2023 by [name], MD.  Called Nurse Triage reporting Knee Pain.Continues to have knee pain.    Symptoms began several months ago  Interventions attempted: Ice/heat application.  Symptoms are: gradually worsening.  Triage Disposition: See  Within 2 Weeks in [place] understands and will follow disposition?: Yes Due to transportation issues pt requested an appt for 12/25/2023.   So appt made for that date.

## 2023-12-11 NOTE — Telephone Encounter (Signed)
FYI above message.

## 2023-12-25 ENCOUNTER — Ambulatory Visit (INDEPENDENT_AMBULATORY_CARE_PROVIDER_SITE_OTHER): Admitting: Family Medicine

## 2023-12-25 ENCOUNTER — Other Ambulatory Visit (INDEPENDENT_AMBULATORY_CARE_PROVIDER_SITE_OTHER): Payer: Self-pay | Admitting: Radiology

## 2023-12-25 ENCOUNTER — Ambulatory Visit: Admitting: Family Medicine

## 2023-12-25 ENCOUNTER — Telehealth: Payer: Self-pay | Admitting: Family Medicine

## 2023-12-25 ENCOUNTER — Encounter: Payer: Self-pay | Admitting: Family Medicine

## 2023-12-25 ENCOUNTER — Encounter: Payer: Self-pay | Admitting: Internal Medicine

## 2023-12-25 VITALS — BP 160/90 | HR 64 | Ht <= 58 in | Wt 123.0 lb

## 2023-12-25 DIAGNOSIS — M1711 Unilateral primary osteoarthritis, right knee: Secondary | ICD-10-CM

## 2023-12-25 DIAGNOSIS — Z96612 Presence of left artificial shoulder joint: Secondary | ICD-10-CM | POA: Diagnosis not present

## 2023-12-25 NOTE — Patient Instructions (Signed)
 You have just been given a cortisone injection to reduce pain and inflammation. After the injection you may notice immediate relief of pain as a result of the Lidocaine . It is important to rest the area of the injection for 24 to 48 hours after the injection. There is a possibility of some temporary increased discomfort and swelling for up to 72 hours until the cortisone begins to work. If you do have pain, simply rest the joint and use ice. If you can tolerate over the counter medications, you can try Tylenol  for added relief per package instructions.  Patient Plan for Post-Visit Guidance  Left Shoulder Pain (Post Shoulder Replacement)  - Increase Tylenol  to 650 mg, two tablets, up to three times daily as needed. - Apply Voltaren gel to the shoulder up to four times daily for two weeks. - If pain continues beyond two weeks, contact your orthopedic surgeon for evaluation and possible imaging. - Obtain approval from your surgeon before considering any injection therapy through this office.  Right Knee Osteoarthritis with Effusion and Pain  - Joint aspiration and cortisone injection performed. - Await contact from the office regarding insurance authorization for gel injections (viscosupplementation). - Continue current pain management as directed. - Follow-up as needed for this issue.  Red Flags: - If you experience severe or sudden pain, inability to move the shoulder or knee, redness, warmth, fever, or significant swelling, contact the office immediately.

## 2023-12-25 NOTE — Assessment & Plan Note (Signed)
 Left shoulder pain - Significant pain in the left shoulder after shucking corn - History of shoulder replacements - Uses Tylenol  650 mg twice daily and has Voltaren gel at home  Left shoulder pain after total shoulder arthroplasty Acute pain in the biceps tendon area post activity, no trauma indicating failure. - Increase Tylenol  to 650 mg, two tablets, up to three times daily. - Use Voltaren gel up to four times daily for two weeks. - If pain persists beyond two weeks, contact orthopedic surgeon for evaluation and imaging. - Obtain surgeon's approval before injection therapy through our office.

## 2023-12-25 NOTE — Assessment & Plan Note (Signed)
 History of Present Illness [name] is a 78 year old female with knee arthritis who presents with knee swelling and pain.  Right knee pain and swelling - Swelling and pain in the right knee for the past three weeks - Symptoms progressively worsening, especially after sitting with the knee flexed - Tylenol  650 mg twice daily provides some relief - Applies a cream prescribed by her primary doctor, not specifically for knee pain  Physical Exam INSPECTION: Suprapatellar fullness on the right knee compared to contralateral. No erythema or chemosis, with 1-2+ effusion on the right knee. PALPATION: Equal tenderness at the lateral and medial patellar facet of the right knee. Tenderness at quadriceps tendon, patellar tendon, and medial joint line of the right knee. RANGE OF MOTION: ROM 0-125 degrees limited by painful terminal flexion, no mechanical obstruction in the right knee.  Assessment and Plan Right knee osteoarthritis with effusion and pain Chronic osteoarthritis with recurrent effusion and pain, indicating a flare-up in the patellofemoral region. - Perform joint aspiration and administer cortisone injection. - Seek insurance authorization for viscosupplementation. - We will contact you once we have an update regarding the gel injections

## 2023-12-25 NOTE — Progress Notes (Signed)
 Primary Care / Sports Medicine Office Visit  Patient Information:  Patient ID: Holly  KAMYIA Bean, female DOB: Dec 15, 1945 Age: 78 y.o. MRN: [id number]   [name] is a pleasant 78 y.o. female presenting with the following:  Chief Complaint  Patient presents with   Knee Pain    Patient presents today for right knee swelling with fluid x 3 weeks. Patient would like to have her knee aspirated today.    Shoulder Pain    Left shoulder pain. Patient had total shoulder in both shoulder.     Vitals:   12/25/23 1311  BP: (!) 160/90  Pulse: 64  SpO2: 94%   Vitals:   12/25/23 1311  Weight: 123 lb (55.8 kg)  Height: 4' 9 (1.448 m)   Body mass index is 26.62 kg/m.  No results found.   Independent interpretation of notes and tests performed by another provider:   None  Procedures performed:   Procedure:  Injection following aspiration of right knee under ultrasound guidance. Ultrasound guidance utilized for in-plane approach to the suprapatellar bursa, effusion noted, confirmation of effusion resolution imaged Samsung HS60 device utilized with permanent recording / reporting. Verbal informed consent obtained and verified. Skin prepped in a sterile fashion. Ethyl chloride for topical local analgesia.  Completed without difficulty and tolerated well. Aspirate: 29 cc synovial fluid noninflammatory Medication: triamcinolone  acetonide 40 mg/mL suspension for injection 1 mL total and 2 mL lidocaine  1% without epinephrine  utilized for needle placement anesthetic Advised to contact for fevers/chills, erythema, induration, drainage, or persistent bleeding.   Pertinent History, Exam, Impression, and Recommendations:   Problem List Items Addressed This Visit     History of left shoulder replacement   Left shoulder pain - Significant pain in the left shoulder after shucking corn - History of shoulder replacements - Uses Tylenol  650 mg twice daily and has Voltaren gel at  home  Left shoulder pain after total shoulder arthroplasty Acute pain in the biceps tendon area post activity, no trauma indicating failure. - Increase Tylenol  to 650 mg, two tablets, up to three times daily. - Use Voltaren gel up to four times daily for two weeks. - If pain persists beyond two weeks, contact orthopedic surgeon for evaluation and imaging. - Obtain surgeon's approval before injection therapy through our office.      Primary osteoarthritis of one knee, right - Primary   History of Present Illness [name] is a 78 year old female with knee arthritis who presents with knee swelling and pain.  Right knee pain and swelling - Swelling and pain in the right knee for the past three weeks - Symptoms progressively worsening, especially after sitting with the knee flexed - Tylenol  650 mg twice daily provides some relief - Applies a cream prescribed by her primary doctor, not specifically for knee pain  Physical Exam INSPECTION: Suprapatellar fullness on the right knee compared to contralateral. No erythema or chemosis, with 1-2+ effusion on the right knee. PALPATION: Equal tenderness at the lateral and medial patellar facet of the right knee. Tenderness at quadriceps tendon, patellar tendon, and medial joint line of the right knee. RANGE OF MOTION: ROM 0-125 degrees limited by painful terminal flexion, no mechanical obstruction in the right knee.  Assessment and Plan Right knee osteoarthritis with effusion and pain Chronic osteoarthritis with recurrent effusion and pain, indicating a flare-up in the patellofemoral region. - Perform joint aspiration and administer cortisone injection. - Seek insurance authorization for viscosupplementation. - We will  contact you once we have an update regarding the gel injections      Relevant Orders   US  LIMITED JOINT SPACE STRUCTURES LOW RIGHT     Orders & Medications Medications: No orders of the defined types were placed  in this encounter.  Orders Placed This Encounter  Procedures   US  LIMITED JOINT SPACE STRUCTURES LOW RIGHT     No follow-ups on file.     Selinda JINNY Ku, MD, [facility]   Primary Care Sports Medicine Primary Care and Sports Medicine at [facility]

## 2023-12-26 NOTE — Telephone Encounter (Signed)
 Completed.

## 2024-01-17 ENCOUNTER — Ambulatory Visit: Payer: 59 | Admitting: Cardiology

## 2024-03-06 NOTE — Telephone Encounter (Signed)
 Monovisc is not covered under prescription benefits and may only be covered under medical benefits as buy and bill. [id number] is covered at 80% rate when performed in an office setting. A pre-cert is needed for [id number].  JM

## 2024-04-04 ENCOUNTER — Inpatient Hospital Stay
Admission: EM | Admit: 2024-04-04 | Discharge: 2024-04-21 | DRG: 189 | Disposition: E | Attending: Emergency Medicine | Admitting: Emergency Medicine

## 2024-04-04 ENCOUNTER — Encounter: Payer: Self-pay | Admitting: Emergency Medicine

## 2024-04-04 ENCOUNTER — Emergency Department

## 2024-04-04 ENCOUNTER — Other Ambulatory Visit: Payer: Self-pay

## 2024-04-04 ENCOUNTER — Ambulatory Visit
Admission: EM | Admit: 2024-04-04 | Discharge: 2024-04-04 | Disposition: A | Discharge status: Home / Self Care | Attending: Emergency Medicine | Admitting: Emergency Medicine

## 2024-04-04 DIAGNOSIS — I130 Hypertensive heart and chronic kidney disease with heart failure and stage 1 through stage 4 chronic kidney disease, or unspecified chronic kidney disease: Secondary | ICD-10-CM | POA: Diagnosis present

## 2024-04-04 DIAGNOSIS — L97919 Non-pressure chronic ulcer of unspecified part of right lower leg with unspecified severity: Secondary | ICD-10-CM | POA: Diagnosis present

## 2024-04-04 DIAGNOSIS — N1832 Chronic kidney disease, stage 3b: Secondary | ICD-10-CM | POA: Diagnosis present

## 2024-04-04 DIAGNOSIS — I739 Peripheral vascular disease, unspecified: Secondary | ICD-10-CM | POA: Diagnosis present

## 2024-04-04 DIAGNOSIS — Z806 Family history of leukemia: Secondary | ICD-10-CM

## 2024-04-04 DIAGNOSIS — G894 Chronic pain syndrome: Secondary | ICD-10-CM | POA: Diagnosis not present

## 2024-04-04 DIAGNOSIS — G8929 Other chronic pain: Secondary | ICD-10-CM | POA: Diagnosis present

## 2024-04-04 DIAGNOSIS — F419 Anxiety disorder, unspecified: Secondary | ICD-10-CM | POA: Diagnosis present

## 2024-04-04 DIAGNOSIS — E785 Hyperlipidemia, unspecified: Secondary | ICD-10-CM | POA: Diagnosis present

## 2024-04-04 DIAGNOSIS — Z66 Do not resuscitate: Secondary | ICD-10-CM | POA: Diagnosis present

## 2024-04-04 DIAGNOSIS — Z853 Personal history of malignant neoplasm of breast: Secondary | ICD-10-CM | POA: Diagnosis not present

## 2024-04-04 DIAGNOSIS — Z85038 Personal history of other malignant neoplasm of large intestine: Secondary | ICD-10-CM

## 2024-04-04 DIAGNOSIS — G2581 Restless legs syndrome: Secondary | ICD-10-CM | POA: Diagnosis present

## 2024-04-04 DIAGNOSIS — J9621 Acute and chronic respiratory failure with hypoxia: Principal | ICD-10-CM | POA: Diagnosis present

## 2024-04-04 DIAGNOSIS — Z515 Encounter for palliative care: Secondary | ICD-10-CM | POA: Diagnosis not present

## 2024-04-04 DIAGNOSIS — R0600 Dyspnea, unspecified: Secondary | ICD-10-CM | POA: Diagnosis not present

## 2024-04-04 DIAGNOSIS — Z8249 Family history of ischemic heart disease and other diseases of the circulatory system: Secondary | ICD-10-CM | POA: Diagnosis not present

## 2024-04-04 DIAGNOSIS — Z833 Family history of diabetes mellitus: Secondary | ICD-10-CM | POA: Diagnosis not present

## 2024-04-04 DIAGNOSIS — R54 Age-related physical debility: Secondary | ICD-10-CM | POA: Diagnosis present

## 2024-04-04 DIAGNOSIS — R0602 Shortness of breath: Secondary | ICD-10-CM

## 2024-04-04 DIAGNOSIS — Z90711 Acquired absence of uterus with remaining cervical stump: Secondary | ICD-10-CM

## 2024-04-04 DIAGNOSIS — J441 Chronic obstructive pulmonary disease with (acute) exacerbation: Secondary | ICD-10-CM | POA: Diagnosis present

## 2024-04-04 DIAGNOSIS — R197 Diarrhea, unspecified: Secondary | ICD-10-CM | POA: Diagnosis present

## 2024-04-04 DIAGNOSIS — J432 Centrilobular emphysema: Secondary | ICD-10-CM | POA: Diagnosis present

## 2024-04-04 DIAGNOSIS — Z87891 Personal history of nicotine dependence: Secondary | ICD-10-CM

## 2024-04-04 DIAGNOSIS — Z7189 Other specified counseling: Secondary | ICD-10-CM | POA: Diagnosis not present

## 2024-04-04 DIAGNOSIS — F32A Depression, unspecified: Secondary | ICD-10-CM | POA: Diagnosis present

## 2024-04-04 DIAGNOSIS — I5032 Chronic diastolic (congestive) heart failure: Secondary | ICD-10-CM | POA: Diagnosis present

## 2024-04-04 DIAGNOSIS — I10 Essential (primary) hypertension: Secondary | ICD-10-CM | POA: Diagnosis not present

## 2024-04-04 DIAGNOSIS — Z79899 Other long term (current) drug therapy: Secondary | ICD-10-CM | POA: Diagnosis not present

## 2024-04-04 DIAGNOSIS — Z9981 Dependence on supplemental oxygen: Secondary | ICD-10-CM

## 2024-04-04 LAB — CBC
HCT: 36.9 % (ref 36.0–46.0)
Hemoglobin: 11.4 g/dL — ABNORMAL LOW (ref 12.0–15.0)
MCH: 29.5 pg (ref 26.0–34.0)
MCHC: 30.9 g/dL (ref 30.0–36.0)
MCV: 95.6 fL (ref 80.0–100.0)
Platelets: 167 K/uL (ref 150–400)
RBC: 3.86 MIL/uL — ABNORMAL LOW (ref 3.87–5.11)
RDW: 13.1 % (ref 11.5–15.5)
WBC: 3.9 K/uL — ABNORMAL LOW (ref 4.0–10.5)
nRBC: 0 % (ref 0.0–0.2)

## 2024-04-04 LAB — BASIC METABOLIC PANEL WITH GFR
Anion gap: 9 (ref 5–15)
BUN: 31 mg/dL — ABNORMAL HIGH (ref 8–23)
CO2: 30 mmol/L (ref 22–32)
Calcium: 10.7 mg/dL — ABNORMAL HIGH (ref 8.9–10.3)
Chloride: 102 mmol/L (ref 98–111)
Creatinine, Ser: 1.2 mg/dL — ABNORMAL HIGH (ref 0.44–1.00)
GFR, Estimated: 46 mL/min — ABNORMAL LOW (ref >60)
Glucose, Bld: 113 mg/dL — ABNORMAL HIGH (ref 70–99)
Potassium: 4.5 mmol/L (ref 3.5–5.1)
Sodium: 141 mmol/L (ref 135–145)

## 2024-04-04 MED ORDER — HYDRALAZINE HCL 20 MG/ML IJ SOLN
5.0000 mg | INTRAMUSCULAR | Status: DC | PRN
  Administered 2024-04-06: 5 mg via INTRAVENOUS
  Filled 2024-04-04: qty 1

## 2024-04-04 MED ORDER — IPRATROPIUM-ALBUTEROL 0.5-2.5 (3) MG/3ML IN SOLN
3.0000 mL | Freq: Once | RESPIRATORY_TRACT | Status: AC
  Administered 2024-04-04: 3 mL via RESPIRATORY_TRACT
  Filled 2024-04-04: qty 3

## 2024-04-04 MED ORDER — METHYLPREDNISOLONE SODIUM SUCC 125 MG IJ SOLR
125.0000 mg | Freq: Once | INTRAMUSCULAR | Status: AC
  Administered 2024-04-04: 125 mg via INTRAVENOUS
  Filled 2024-04-04: qty 2

## 2024-04-04 MED ORDER — DM-GUAIFENESIN ER 30-600 MG PO TB12: 1.0000 | ORAL_TABLET | Freq: Two times a day (BID) | ORAL | Status: DC | PRN

## 2024-04-04 MED ORDER — ACETAMINOPHEN 325 MG PO TABS: 650.0000 mg | ORAL_TABLET | Freq: Four times a day (QID) | ORAL | Status: DC | PRN

## 2024-04-04 MED ORDER — ALBUTEROL SULFATE (2.5 MG/3ML) 0.083% IN NEBU
2.5000 mg | INHALATION_SOLUTION | RESPIRATORY_TRACT | Status: DC | PRN
Start: 2024-04-04 — End: 2024-04-08
  Administered 2024-04-05: 2.5 mg via RESPIRATORY_TRACT
  Filled 2024-04-04: qty 3

## 2024-04-04 MED ORDER — METHYLPREDNISOLONE SODIUM SUCC 125 MG IJ SOLR: 80.0000 mg | Freq: Every day | INTRAMUSCULAR | Status: DC

## 2024-04-04 MED ORDER — IPRATROPIUM-ALBUTEROL 0.5-2.5 (3) MG/3ML IN SOLN
3.0000 mL | RESPIRATORY_TRACT | Status: DC
  Administered 2024-04-05 – 2024-04-06 (×8): 3 mL via RESPIRATORY_TRACT
  Filled 2024-04-04 (×11): qty 3

## 2024-04-04 MED ORDER — ALBUTEROL SULFATE HFA 108 (90 BASE) MCG/ACT IN AERS: 2.0000 | INHALATION_SPRAY | RESPIRATORY_TRACT | Status: DC | PRN

## 2024-04-04 MED ORDER — ENOXAPARIN SODIUM 30 MG/0.3ML IJ SOSY
30.0000 mg | PREFILLED_SYRINGE | INTRAMUSCULAR | Status: DC
  Filled 2024-04-04: qty 0.3

## 2024-04-04 MED ORDER — IPRATROPIUM-ALBUTEROL 0.5-2.5 (3) MG/3ML IN SOLN
3.0000 mL | Freq: Once | RESPIRATORY_TRACT | Status: AC
  Administered 2024-04-04: 3 mL via RESPIRATORY_TRACT

## 2024-04-04 MED ORDER — ONDANSETRON HCL 4 MG/2ML IJ SOLN: 4.0000 mg | Freq: Three times a day (TID) | INTRAMUSCULAR | Status: DC | PRN

## 2024-04-04 MED ORDER — MAGNESIUM SULFATE 2 GM/50ML IV SOLN
2.0000 g | Freq: Once | INTRAVENOUS | Status: AC
  Administered 2024-04-04: 2 g via INTRAVENOUS
  Filled 2024-04-04: qty 50

## 2024-04-04 MED ORDER — ACETAMINOPHEN 325 MG PO TABS
650.0000 mg | ORAL_TABLET | Freq: Once | ORAL | Status: AC
  Administered 2024-04-04: 650 mg via ORAL
  Filled 2024-04-04: qty 2

## 2024-04-04 NOTE — ED Notes (Signed)
 Patient is being discharged from the Urgent Care and sent to the Emergency Department via POV. Pt refused EMS transport . Per Venetia Motto, patient is in need of higher level of care due to Hypoxia. Patient is aware and verbalizes understanding of plan of care.  Vitals:   04/04/24 1412 04/04/24 1421  BP: 101/63   Pulse: 84   Resp: (!) 24   Temp: 98.4 F (36.9 C)   SpO2: (!) 76% (!) 87%

## 2024-04-04 NOTE — ED Triage Notes (Signed)
 Pt comes via EMS from home with c/o pneumonia. Pt was dx wt UC earlier. Pt was given meds but advised to come to the hospital.   Lungs diminished and junky per EMs. Pt on 3.5/4L at baseline.

## 2024-04-04 NOTE — ED Provider Notes (Signed)
 [facility]    CSN: [id number] Arrival date & time: 04/04/24  1348      History   Chief Complaint Chief Complaint  Patient presents with   Cough   Shortness of Breath    HPI [name] is a 78 y.o. female.   HPI  78 year old female with past medical history significant for colon cancer, chronic kidney disease, hypertension, centrilobular emphysema, CHF, depression, shortness of breath, subdural hematoma, and hyperlipidemia presents for evaluation of shortness of breath that has been going on for the past week.  She is also been having some intermittent chest pain on the right side of her chest.  She is typically on nasal cannula at 3-1/2 L/min but she has had to increase it to 4 L/min over the last week.  She reports that she has had an elevated temp of 99 at home.  Patient is dyspneic and tachypneic at triage.  Oxygen  saturation is 73% on 4 L nasal cannula.  Past Medical History:  Diagnosis Date   Acute renal failure superimposed on stage 3 chronic kidney disease (HCC) 10/27/2014   Back pain, chronic    Breast cancer (HCC) 09/09/2010   Cancer of colon (HCC) 05/24/2011   Centrilobular emphysema (HCC) 07/15/2018   CKD (chronic kidney disease), stage III (HCC)    Claudication, intermittent 07/15/2018   Depression    GIB (gastrointestinal bleeding)    H/O tobacco use, presenting hazards to health Quit Dec 2019 05/03/2011   50 years, up to 3PPD quit last year.    Hx of varicose veins of lower extremity 07/15/2018   Hyperlipidemia    Hypertension    SDH (subdural hematoma) (HCC)    SOB (shortness of breath) 07/15/2018    Patient Active Problem List   Diagnosis Date Noted   History of left shoulder replacement 12/25/2023   Primary osteoarthritis of one knee, right 09/17/2023   Pressure injury of skin 05/03/2023   Acute on chronic heart failure with preserved ejection fraction (HFpEF) (HCC) 05/02/2023   Acute on chronic respiratory failure with hypoxia  (HCC) 05/02/2023   Chronic anemia 05/02/2023   History of GI bleed 05/02/2023   S/P exploratory laparotomy 04/08/23 for perforated gastric ulcer 05/02/2023   Chronic venous insufficiency 03/05/2023   Dysphagia 09/20/2021   Restless leg syndrome 09/20/2021   B12 deficiency 09/19/2021   Chronic respiratory failure with hypoxia (HCC) 09/18/2021   COPD with acute exacerbation (HCC) 11/08/2020   Elevated troponin 11/08/2020   Chronic kidney disease, stage 3b (HCC) 11/08/2020   Shock (HCC) 06/22/2019   Shock circulatory (HCC) 06/21/2019   Ruptured varicose vein 06/21/2019   COPD (chronic obstructive pulmonary disease) (HCC) 06/21/2019   Chronic diastolic CHF (congestive heart failure) (HCC) 06/21/2019   SOB (shortness of breath) 07/15/2018   Laboratory examination 07/15/2018   PAD (peripheral artery disease) 07/15/2018   Centrilobular emphysema (HCC) 07/15/2018   Hx of varicose veins of lower extremity 07/15/2018   Back pain 01/21/2016   Sacroiliac joint dysfunction of left side 12/27/2015   History of colon cancer 09/21/2015   Iron  deficiency anemia 06/04/2015   Syncope 11/05/2014   Narcotic addiction (HCC)    Chronic back pain 10/27/2014   Faintness    Frequent falls 08/22/2014   Facial contusion 08/22/2014   Subdural hematoma (HCC) 08/21/2014   AKI (acute kidney injury) 01/20/2013   Acute mastitis of right breast 04/05/2012   Right shoulder pain 03/27/2012   Breast erythema most likely secondary to mastitis / cellulitis 03/24/2012  Hyponatremia 03/24/2012   Cancer of colon (HCC) 05/24/2011   Cancer of right breast, stage 1 (HCC) 05/24/2011   Colonic mass 05/03/2011   H/O tobacco use, presenting hazards to health 05/03/2011   Weakness generalized 05/01/2011   Acute blood loss anemia 05/01/2011   Falls frequently 05/01/2011   GI bleed 05/01/2011   Hypokalemia 05/01/2011   HTN (hypertension) 05/01/2011   Anxiety and depression 05/01/2011   Lower extremity edema 05/01/2011     Past Surgical History:  Procedure Laterality Date   ABDOMINAL AORTOGRAM W/LOWER EXTREMITY Left 03/18/2019   Procedure: ABDOMINAL AORTOGRAM W/LOWER EXTREMITY;  Surgeon: Ladona Heinz, MD;  Location[facility] INVASIVE CV LAB;  Service: Cardiovascular;  Laterality: Left;   BACK SURGERY     BIOPSY  09/20/2021   Procedure: BIOPSY;  Surgeon: Dianna Specking, MD;  Location: [facility] ENDOSCOPY;  Service: Gastroenterology;;   BREAST LUMPECTOMY Right 10/10/2010   COLON SURGERY  04/2011   COLONOSCOPY  05/03/2011   Procedure: COLONOSCOPY;  Surgeon: Oliva FORBES Boots, MD;  Location: [facility] ENDOSCOPY;  Service: Endoscopy;  Laterality: N/A;   ESOPHAGOGASTRODUODENOSCOPY (EGD) WITH PROPOFOL  N/A 09/20/2021   Procedure: ESOPHAGOGASTRODUODENOSCOPY (EGD) WITH PROPOFOL ;  Surgeon: Dianna Specking, MD;  Location: [facility] ENDOSCOPY;  Service: Gastroenterology;  Laterality: N/A;   JOINT REPLACEMENT     R&L total shoulder replacements   LUMBAR DISC SURGERY     PARTIAL HYSTERECTOMY     PERIPHERAL VASCULAR INTERVENTION  03/18/2019   Procedure: PERIPHERAL VASCULAR INTERVENTION;  Surgeon: Ladona Heinz, MD;  Location: [facility] INVASIVE CV LAB;  Service: Cardiovascular;;  attempted left SFA   SHOULDER SURGERY     TUMOR REMOVAL  Tumor removed R breast    OB History   No obstetric history on file.      Home Medications    Prior to Admission medications   Medication Sig Start Date End Date Taking? Authorizing Provider  acetaminophen  (TYLENOL ) 650 MG CR tablet Take 650 mg by mouth every 8 (eight) hours as needed for pain.    [provider]  ALPRAZolam  (XANAX ) 0.5 MG tablet Take 0.25 mg by mouth as needed. 11/05/14   [provider]  amLODipine  (NORVASC ) 5 MG tablet Take 1 tablet (5 mg total) by mouth daily. 07/04/22   Ladona Heinz, MD  atorvastatin  (LIPITOR) 10 MG tablet Take 1 tablet (10 mg total) by mouth daily. 04/18/23   Ladona Heinz, MD  buprenorphine  (BUTRANS ) 10 MCG/HR PTWK Place 1 patch onto the skin once a week.    [provider]  Cholecalciferol  25 MCG (1000 UT) TBDP Take 1,000 Units by mouth daily.    [provider]  cyanocobalamin  (VITAMIN B12) 1000 MCG tablet Take 1,000 mcg by mouth daily.    [provider]  furosemide  (LASIX ) 20 MG tablet Take 20 mg by mouth daily. 05/01/23   [provider]  gabapentin  (NEURONTIN ) 300 MG capsule Take 1 capsule (300 mg total) by mouth 3 (three) times daily. 07/04/22   Ladona Heinz, MD  ipratropium-albuterol  (DUONEB) 0.5-2.5 (3) MG/3ML SOLN Take 3 mLs by nebulization every 6 (six) hours as needed. 07/12/22   Cobb, Comer GAILS, NP  losartan  (COZAAR ) 25 MG tablet Take 25 mg by mouth daily.    [provider]  Multiple Vitamins-Minerals (MULTIVITAMIN ADULT EXTRA C) CHEW     [provider]  rOPINIRole  (REQUIP ) 1 MG tablet Take 1 tablet (1 mg total) by mouth 3 (three) times daily. 03/27/23   Ladona Heinz, MD    Family History Family History  Problem Relation Age of Onset   Diabetes Mother    Hypertension Mother    Cancer Mother        leukemia   Sudden death Mother    Heart attack Mother    Anesthesia problems Neg Hx    Hypotension Neg Hx    Malignant hyperthermia Neg Hx    Pseudochol deficiency Neg Hx    Breast cancer Neg Hx    Lung disease Neg Hx     Social History Social History   Tobacco Use   Smoking status: Former    Current packs/day: 0.00    Average packs/day: 0.3 packs/day for 30.0 years (7.5 ttl pk-yrs)    Types: Cigarettes    Start date: 10/30/1989    Quit date: 10/31/2019    Years since quitting: 4.4   Smokeless tobacco: Never   Tobacco comments:    had 6 cigarettes recently 03-10-20  Vaping Use   Vaping status: Never Used  Substance Use Topics   Alcohol use: Yes    Comment: rarely    Drug use: No     Allergies   Patient has no known allergies.   Review of Systems Review of Systems  Constitutional:  Positive for fever.  Respiratory:  Positive for shortness of breath.   Cardiovascular:   Positive for chest pain.     Physical Exam Triage Vital Signs ED Triage Vitals  Encounter Vitals Group     BP      Girls Systolic BP Percentile      Girls Diastolic BP Percentile      Boys Systolic BP Percentile      Boys Diastolic BP Percentile      Pulse      Resp      Temp      Temp src      SpO2      Weight      Height      Head Circumference      Peak Flow      Pain Score      Pain Loc      Pain Education      Exclude from Growth Chart    No data found.  Updated Vital Signs BP 101/63 (BP Location: Left Arm)   Pulse 84   Temp 98.4 F (36.9 C) (Oral)   Resp (!) 24   SpO2 (!) 87%   Visual Acuity Right Eye Distance:   Left Eye Distance:   Bilateral Distance:    Right Eye Near:   Left Eye Near:    Bilateral Near:     Physical Exam Vitals and nursing note reviewed.  Constitutional:      General: She is in acute distress.  Cardiovascular:     Rate and Rhythm: Normal rate and regular rhythm.     Pulses: Normal pulses.     Heart sounds: Normal heart sounds. No murmur heard.    No friction rub. No gallop.  Pulmonary:     Effort: Respiratory distress present.     Comments: Increased work of breathing and decreased breath sounds diffusely. Skin:    General: Skin is warm and dry.     Capillary Refill: Capillary refill takes less than 2 seconds.  Neurological:     General: No focal deficit present.     Mental Status: She is alert and oriented to person, place, and time.      UC Treatments / Results  Labs (all labs ordered are listed, but only abnormal  results are displayed) Labs Reviewed - No data to display  EKG Sinus rhythm with short PR with occasional PVCs Ventricular rate 75 beats per minute PR DeVeau 90 ms QRS duration 130 ms QT/QTc 398/444 ms Right bundle branch block, flipped T's in V2 and V3.  Radiology No results found.  Procedures Procedures (including critical care time)  Medications Ordered in UC Medications   ipratropium-albuterol  (DUONEB) 0.5-2.5 (3) MG/3ML nebulizer solution 3 mL (3 mLs Nebulization Given 04/04/24 1414)    Initial Impression / Assessment and Plan / UC Course  I have reviewed the triage vital signs and the nursing notes.  Pertinent labs & imaging results that were available during my care of the patient were reviewed by me and considered in my medical decision making (see chart for details).   Patient is here with family who reports that for the last week she has been experiencing respiratory symptoms and increasing shortness of breath.  She was seen in [place] at the [facility] heart vascular clinic for a right leg ulcer.  She was demonstrating lower extremity edema and ulceration.  There is no mention of her respiratory status at that time but the family reports that she was advised to go to urgent care which she did not.  She is experiencing increased work of breathing, mild dyspnea, and decreased lung sounds diffusely.  She is on 4 L by nasal cannula satting 93%.  I will order a DuoNeb to see if we can open up the patient's lungs.  She needs to be evaluated in the emergency department.  I will also obtain an EKG due to the fact the patient is complaining of right-sided chest pain.  The patient wants to be evaluated in [place] and is refusing EMS transport to [facility].  EKG shows right bundle branch block which is not new this was present on EKG dated 05/11/2023.    Following her breathing treatment her oxygen  saturation has come up to 87% on 4 L nasal cannula.   Final Clinical Impressions(s) / UC Diagnoses   Final diagnoses:  Shortness of breath     Discharge Instructions      Please go to the ER at [facility] to be evaluated for your shortness of breath.     ED Prescriptions   None    PDMP not reviewed this encounter.   Bernardino Ditch, NP 04/04/24 1426

## 2024-04-04 NOTE — Progress Notes (Signed)
 PHARMACIST - PHYSICIAN COMMUNICATION  CONCERNING:  Enoxaparin  (Lovenox ) for DVT Prophylaxis    RECOMMENDATION: Patient was prescribed enoxaprin 40mg  q24 hours for VTE prophylaxis.   Filed Weights   04/04/24 1557  Weight: 54.4 kg (120 lb)    Body mass index is 25.97 kg/m.  Estimated Creatinine Clearance: 27.8 mL/min (A) (by C-G formula based on SCr of 1.2 mg/dL (H)).  Patient is candidate for enoxaparin  30mg  every 24 hours based on CrCl <16ml/min or Weight <45kg  DESCRIPTION: Pharmacy has adjusted enoxaparin  dose per [facility] policy.  Patient is now receiving enoxaparin  30 mg every 24 hours   Rankin CANDIE Dills, PharmD, [facility] 04/04/2024 10:30 PM

## 2024-04-04 NOTE — ED Provider Notes (Signed)
 [facility] Provider Note    Event Date/Time   First MD Initiated Contact with Patient 04/04/24 1742     (approximate)   History   Shortness of Breath   HPI  [name] is a 78 y.o. female with history of COPD, typically uses 3.5 L nasal cannula who presents with shortness of breath, cough.  Referred from urgent care with concerns for possible pneumonia.  No calf pain or swelling     Physical Exam   Triage Vital Signs: ED Triage Vitals  Encounter Vitals Group     BP 04/04/24 1559 (!) 142/62     Girls Systolic BP Percentile --      Girls Diastolic BP Percentile --      Boys Systolic BP Percentile --      Boys Diastolic BP Percentile --      Pulse Rate 04/04/24 1559 61     Resp 04/04/24 1559 20     Temp 04/04/24 1559 98.4 F (36.9 C)     Temp src --      SpO2 04/04/24 1559 97 %     Weight 04/04/24 1557 54.4 kg (120 lb)     Height 04/04/24 1557 1.448 m (4' 9)     Head Circumference --      Peak Flow --      Pain Score 04/04/24 1557 3     Pain Loc --      Pain Education --      Exclude from Growth Chart --     Most recent vital signs: Vitals:   04/04/24 1730 04/04/24 2030  BP:  (!) 108/49  Pulse:  63  Resp:  17  Temp:    SpO2: 93% 97%     General: Awake,  CV:  Good peripheral perfusion.  No tachycardia Resp:  Mildly increased respiratory effort with poor air movement, scattered wheezes Abd:  No distention.  Other:  No calf pain or swelling   ED Results / Procedures / Treatments   Labs (all labs ordered are listed, but only abnormal results are displayed) Labs Reviewed  BASIC METABOLIC PANEL WITH GFR - Abnormal; Notable for the following components:      Result Value   Glucose, Bld 113 (*)    BUN 31 (*)    Creatinine, Ser 1.20 (*)    Calcium  10.7 (*)    GFR, Estimated 46 (*)    All other components within normal limits  CBC - Abnormal; Notable for the following components:   WBC 3.9 (*)    RBC 3.86 (*)     Hemoglobin 11.4 (*)    All other components within normal limits     EKG  ED ECG REPORT I, Lamar Price, the attending physician, personally viewed and interpreted this ECG.  Date: 04/04/2024  Rhythm: normal sinus rhythm QRS Axis: normal Intervals: Right bundle branch block ST/T Wave abnormalities: normal Narrative Interpretation: no evidence of acute ischemia    RADIOLOGY Chest x-ray view interpret by me, no pneumonia, radiology notes mild to moderate bronchitic changes    PROCEDURES:  Critical Care performed:   Procedures   MEDICATIONS ORDERED IN ED: Medications  magnesium sulfate IVPB 2 g 50 mL (0 g Intravenous Stopped 04/04/24 2037)  ipratropium-albuterol  (DUONEB) 0.5-2.5 (3) MG/3ML nebulizer solution 3 mL (3 mLs Nebulization Given 04/04/24 1913)  ipratropium-albuterol  (DUONEB) 0.5-2.5 (3) MG/3ML nebulizer solution 3 mL (3 mLs Nebulization Given 04/04/24 1912)  methylPREDNISolone  sodium succinate (SOLU-MEDROL ) 125 mg/2 mL injection  125 mg (125 mg Intravenous Given 04/04/24 1859)  acetaminophen  (TYLENOL ) tablet 650 mg (650 mg Oral Given 04/04/24 1857)     IMPRESSION / MDM / ASSESSMENT AND PLAN / ED COURSE  I reviewed the triage vital signs and the nursing notes. Patient's presentation is most consistent with severe exacerbation of chronic illness.  Patient presents with shortness of breath as described above, she reports increased oxygen  requirement needing to use her approximately 4 L nasal cannula at home.  Does have cough sometimes productive.  Differential includes pneumonia, COPD exacerbation, bronchitis  Will treat with IV Solu-Medrol , IV magnesium, DuoNebs, obtain labs, chest x-ray and reevaluate  Chest x-ray without evidence of pneumonia lab work demonstrates likely mild dehydration but otherwise reassuring  After treatment patient reports some improvement however oxygen  saturations are hovering around 88% on her typical 3.5 L nasal cannula, have had to  increase her to 5 L nasal cannula.  She still has significant wheezing on exam, will consult the hospitalist for admission      FINAL CLINICAL IMPRESSION(S) / ED DIAGNOSES   Final diagnoses:  COPD exacerbation (HCC)     Rx / DC Orders   ED Discharge Orders     None        Note:  This document was prepared using Dragon voice recognition software and may include unintentional dictation errors.   Arlander Charleston, MD 04/04/24 2207

## 2024-04-04 NOTE — ED Triage Notes (Signed)
 Pt c/o shortness of breath, cough. Started about a week ago. Pt is hypoxic with o2 of 76%

## 2024-04-04 NOTE — H&P (Addendum)
 History and Physical    [name] FMW:[id number] DOB: [birth year] DOA: 04/04/2024  Referring MD/NP/PA:   PCP: Alyse Bradley, MD   Patient coming from:  The patient is coming from home.     Chief Complaint: SOB  HPI: [name] is a 78 y.o. female with medical history significant of COPD on 3.5 L of O2, HTN, HLD, depression, history of breast cancer 2012 on anastrozole , history of colon cancer 2012, history of SDH, history of GI bleeding, chronic pain on buprenorphine  patch weekly, who presents with SOB.  Patient states that her shortness of breath has been progressively worsening for 1 week.  She has cough with little mucus production.  She has intermittent front chest pain, currently no active chest pain.  No fever or chills.  Patient does not have nausea, vomiting, abdominal pain.  She reports diarrhea, 3-4 times of diarrhea each day.  No symptoms of UTI. Per report, patient has chronic right lower leg ulcer. She was seen in [place] at the [facility] heart vascular clinic. Her lower legs are wrapped up today. She refused my examination of her lower legs.  Patient was seen in urgent care, found to have oxygen  desaturation to 73% on 4 L oxygen  (patient is 3.5 L oxygen  at baseline).  Patient was sent to ED for further evaluation and treatment.  At arrival to ED, pt has severe respiratory distress, using accessory muscle for breathing, started 5 L oxygen  with 97% saturation, but then developed respiratory distress again, BiPAP is started.   Data reviewed independently and ED Course: pt was found to have WBC 3.9, stable renal function, temperature normal, blood pressure 108/49, heart rate 60-80s, RR 24--> 17.  Patient is admitted to PCU as inpatient.  Chest x-ray: 1. No acute abnormality. 2. Mild-to-moderate bronchitic changes and mild changes of COPD. 3. Stable mildly prominent bilateral hilar pulmonary arteries, suggesting pulmonary arterial hypertension.    EKG: I have  personally reviewed.  Sinus rhythm, QTc 444, right bundle blockade, early progression.   Review of Systems:   General: no fevers, chills, no body weight gain, has fatigue HEENT: no blurry vision, hearing changes or sore throat Respiratory: has dyspnea, coughing, wheezing CV: Has intermittent chest pain, no active chest pain currently, no palpitations GI: no nausea, vomiting, abdominal pain, diarrhea, constipation GU: no dysuria, burning on urination, increased urinary frequency, hematuria  Ext: no leg edema Neuro: no unilateral weakness, numbness, or tingling, no vision change or hearing loss Skin: has leg ulcers MSK: No muscle spasm, no deformity, no limitation of range of movement in spin Heme: No easy bruising.  Travel history: No recent long distant travel.   Allergy: No Known Allergies  Past Medical History:  Diagnosis Date   Acute renal failure superimposed on stage 3 chronic kidney disease (HCC) 10/27/2014   Back pain, chronic    Breast cancer (HCC) 09/09/2010   Cancer of colon (HCC) 05/24/2011   Centrilobular emphysema (HCC) 07/15/2018   CKD (chronic kidney disease), stage III (HCC)    Claudication, intermittent 07/15/2018   Depression    GIB (gastrointestinal bleeding)    H/O tobacco use, presenting hazards to health Quit Dec 2019 05/03/2011   50 years, up to 3PPD quit last year.    Hx of varicose veins of lower extremity 07/15/2018   Hyperlipidemia    Hypertension    SDH (subdural hematoma) (HCC)    SOB (shortness of breath) 07/15/2018    Past Surgical History:  Procedure Laterality Date  ABDOMINAL AORTOGRAM W/LOWER EXTREMITY Left 03/18/2019   Procedure: ABDOMINAL AORTOGRAM W/LOWER EXTREMITY;  Surgeon: Ladona Heinz, MD;  Location[facility] INVASIVE CV LAB;  Service: Cardiovascular;  Laterality: Left;   BACK SURGERY     BIOPSY  09/20/2021   Procedure: BIOPSY;  Surgeon: Dianna Specking, MD;  Location: [facility] ENDOSCOPY;  Service: Gastroenterology;;   BREAST LUMPECTOMY  Right 10/10/2010   COLON SURGERY  04/2011   COLONOSCOPY  05/03/2011   Procedure: COLONOSCOPY;  Surgeon: Oliva FORBES Boots, MD;  Location: [facility] ENDOSCOPY;  Service: Endoscopy;  Laterality: N/A;   ESOPHAGOGASTRODUODENOSCOPY (EGD) WITH PROPOFOL  N/A 09/20/2021   Procedure: ESOPHAGOGASTRODUODENOSCOPY (EGD) WITH PROPOFOL ;  Surgeon: Dianna Specking, MD;  Location: [facility] ENDOSCOPY;  Service: Gastroenterology;  Laterality: N/A;   JOINT REPLACEMENT     R&L total shoulder replacements   LUMBAR DISC SURGERY     PARTIAL HYSTERECTOMY     PERIPHERAL VASCULAR INTERVENTION  03/18/2019   Procedure: PERIPHERAL VASCULAR INTERVENTION;  Surgeon: Ladona Heinz, MD;  Location: [facility] INVASIVE CV LAB;  Service: Cardiovascular;;  attempted left SFA   SHOULDER SURGERY     TUMOR REMOVAL  Tumor removed R breast    Social History:  reports that she quit smoking about 4 years ago. Her smoking use included cigarettes. She started smoking about 34 years ago. She has a 7.5 pack-year smoking history. She has never used smokeless tobacco. She reports current alcohol use. She reports that she does not use drugs.  Family History:  Family History  Problem Relation Age of Onset   Diabetes Mother    Hypertension Mother    Cancer Mother        leukemia   Sudden death Mother    Heart attack Mother    Anesthesia problems Neg Hx    Hypotension Neg Hx    Malignant hyperthermia Neg Hx    Pseudochol deficiency Neg Hx    Breast cancer Neg Hx    Lung disease Neg Hx      Prior to Admission medications   Medication Sig Start Date End Date Taking? Authorizing Provider  acetaminophen  (TYLENOL ) 650 MG CR tablet Take 650 mg by mouth every 8 (eight) hours as needed for pain.    [provider]  ALPRAZolam  (XANAX ) 0.5 MG tablet Take 0.25 mg by mouth as needed. 11/05/14   [provider]  amLODipine  (NORVASC ) 5 MG tablet Take 1 tablet (5 mg total) by mouth daily. 07/04/22   Ladona Heinz, MD  atorvastatin  (LIPITOR) 10 MG tablet Take 1  tablet (10 mg total) by mouth daily. 04/18/23   Ladona Heinz, MD  buprenorphine  (BUTRANS ) 10 MCG/HR PTWK Place 1 patch onto the skin once a week.    [provider]  Cholecalciferol  25 MCG (1000 UT) TBDP Take 1,000 Units by mouth daily.    [provider]  cyanocobalamin  (VITAMIN B12) 1000 MCG tablet Take 1,000 mcg by mouth daily.    [provider]  furosemide  (LASIX ) 20 MG tablet Take 20 mg by mouth daily. 05/01/23   [provider]  gabapentin  (NEURONTIN ) 300 MG capsule Take 1 capsule (300 mg total) by mouth 3 (three) times daily. 07/04/22   Ladona Heinz, MD  ipratropium-albuterol  (DUONEB) 0.5-2.5 (3) MG/3ML SOLN Take 3 mLs by nebulization every 6 (six) hours as needed. 07/12/22   Cobb, Comer GAILS, NP  losartan  (COZAAR ) 25 MG tablet Take 25 mg by mouth daily.    [provider]  Multiple Vitamins-Minerals (MULTIVITAMIN ADULT EXTRA C) CHEW     [provider]  rOPINIRole  (REQUIP ) 1 MG tablet Take 1 tablet (1 mg total) by mouth 3 (three) times daily. 03/27/23   Ladona Heinz, MD    Physical Exam: Vitals:   04/04/24 1559 04/04/24 1730 04/04/24 2030 04/04/24 2200  BP: (!) 142/62  (!) 108/49 (!) 145/54  Pulse: 61  63 75  Resp: 20  17 18   Temp: 98.4 F (36.9 C)   98.5 F (36.9 C)  TempSrc:    Oral  SpO2: 97% 93% 97% 93%  Weight:      Height:       General: Has acute respiratory distress HEENT:       Eyes: PERRL, EOMI, no jaundice       ENT: No discharge from the ears and nose, no pharynx injection, no tonsillar enlargement.        Neck: No JVD, no bruit, no mass felt. Heme: No neck lymph node enlargement. Cardiac: S1/S2, RRR, No murmurs, No gallops or rubs. Respiratory: has decreased air movement, wheezing bilaterally GI: Soft, nondistended, nontender, no rebound pain, no organomegaly, BS present. GU: No hematuria Ext: both lower legs are wrapped up. Patient refused my examination of her lower leg.  No edema in the upper  legs. Musculoskeletal: No joint deformities, No joint redness or warmth, no limitation of ROM in spin. Skin: No rashes.  Neuro: Alert, oriented X3, cranial nerves II-XII grossly intact, moves all extremities normally. Psych: Patient is not psychotic, no suicidal or hemocidal ideation.  Labs on Admission: I have personally reviewed following labs and imaging studies  CBC: Recent Labs  Lab 04/04/24 1600  WBC 3.9*  HGB 11.4*  HCT 36.9  MCV 95.6  PLT 167   Basic Metabolic Panel: Recent Labs  Lab 04/04/24 1600  NA 141  K 4.5  CL 102  CO2 30  GLUCOSE 113*  BUN 31*  CREATININE 1.20*  CALCIUM  10.7*   GFR: Estimated Creatinine Clearance: 27.8 mL/min (A) (by C-G formula based on SCr of 1.2 mg/dL (H)). Liver Function Tests: No results for input(s): AST, ALT, ALKPHOS, BILITOT, PROT, ALBUMIN in the last 168 hours. No results for input(s): LIPASE, AMYLASE in the last 168 hours. No results for input(s): AMMONIA in the last 168 hours. Coagulation Profile: No results for input(s): INR, PROTIME in the last 168 hours. Cardiac Enzymes: No results for input(s): CKTOTAL, CKMB, CKMBINDEX, TROPONINI in the last 168 hours. BNP (last 3 results) No results for input(s): PROBNP in the last 8760 hours. HbA1C: No results for input(s): HGBA1C in the last 72 hours. CBG: No results for input(s): GLUCAP in the last 168 hours. Lipid Profile: No results for input(s): CHOL, HDL, LDLCALC, TRIG, CHOLHDL, LDLDIRECT in the last 72 hours. Thyroid  Function Tests: No results for input(s): TSH, T4TOTAL, FREET4, T3FREE, THYROIDAB in the last 72 hours. Anemia Panel: No results for input(s): VITAMINB12, FOLATE, FERRITIN, TIBC, IRON , RETICCTPCT in the last 72 hours. Urine analysis:    Component Value Date/Time   COLORURINE COLORLESS (A) 05/01/2023 2239   APPEARANCEUR CLEAR (A) 05/01/2023 2239   LABSPEC 1.006 05/01/2023 2239   PHURINE 9.0  (H) 05/01/2023 2239   GLUCOSEU NEGATIVE 05/01/2023 2239   HGBUR NEGATIVE 05/01/2023 2239   BILIRUBINUR NEGATIVE 05/01/2023 2239   KETONESUR NEGATIVE 05/01/2023 2239   PROTEINUR 30 (A) 05/01/2023 2239   UROBILINOGEN 0.2 11/05/2014 0437   NITRITE NEGATIVE 05/01/2023 2239   LEUKOCYTESUR NEGATIVE 05/01/2023 2239   Sepsis Labs: @LABRCNTIP (procalcitonin:4,lacticidven:4) )No results found for this or any previous visit (from the past  240 hours).   Radiological Exams on Admission:   Assessment/Plan Principal Problem:   COPD exacerbation (HCC) Active Problems:   Acute on chronic respiratory failure with hypoxia (HCC)   Chronic diastolic CHF (congestive heart failure) (HCC)   HTN (hypertension)   Chronic kidney disease, stage 3b (HCC)   HLD (hyperlipidemia)   Hypercalcemia   Diarrhea   Anxiety and depression   Chronic back pain   Assessment and Plan:   Acute on chronic respiratory failure with hypoxia due to COPD exacerbation (HCC): Has increased oxygen  requirement to 5 L.  No infiltration on chest x-ray.  -will admit patient to PCU as inpt -Bronchodilators and prn Mucinex  -Solu-Medrol  80 mg IV daily after given 125 mg of Solu-Medrol  -Z-pak - 2 g of magnesium sulfate was given in ED. -Incentive spirometry -check RVP -Follow up sputum culture - start BiPAP - check VBG --> pH 7.29, CO2 73, O2 56  Chronic diastolic CHF (congestive heart failure) (HCC): 2D echo on 05/02/2023 showed EF of 65-70% with grade 1 diastolic dysfunction.  No pulm pressure on chest x-ray.  Does not seem to have CHF exacerbation. - Check proBNP - Continue home Lasix  and spironolactone   HTN (hypertension) -IV hydralazine  as needed - Amlodipine , Cozaar   Chronic kidney disease, stage 3b (HCC): Renal function stable - Follow-up with BMP  HLD (hyperlipidemia) -Lipitor  Hypercalcemia: Calcium  10.7.  This is chronic issue.  Etiology is not clear. -Check PTH, PTH related peptide and vitamin D1,25 -  Hold vitamin D  supplement  Diarrhea -Check C. difficile and a GI pathogen panel  Anxiety and depression -Requip   Chronic back pain - Patient is on buprenorphine  patch weekly - Continue home as needed tramadol        DVT ppx: SQ Lovenox   Code Status: DNR per pt (I discussed with patient and explained the meaning of CODE STATUS. Patient wants to be DNR)  Family Communication:     not done, no family member is at bed side.    Disposition Plan:  Anticipate discharge back to previous environment  Consults called:  none  Admission status and Level of care: Progressive:   as inpt        Dispo: The patient is from: Home              Anticipated d/c is to: Home              Anticipated d/c date is: 2 days              Patient currently is not medically stable to d/c.    Severity of Illness:  The appropriate patient status for this patient is INPATIENT. Inpatient status is judged to be reasonable and necessary in order to provide the required intensity of service to ensure the patient's safety. The patient's presenting symptoms, physical exam findings, and initial radiographic and laboratory data in the context of their chronic comorbidities is felt to place them at high risk for further clinical deterioration. Furthermore, it is not anticipated that the patient will be medically stable for discharge from the hospital within 2 midnights of admission.   * I certify that at the point of admission it is my clinical judgment that the patient will require inpatient hospital care spanning beyond 2 midnights from the point of admission due to high intensity of service, high risk for further deterioration and high frequency of surveillance required.*       Date of Service 04/05/2024    Caleb Exon [facility]  If 7PM-7AM, please contact night-coverage [email or web address] 04/05/2024, 1:35 AM

## 2024-04-04 NOTE — Discharge Instructions (Addendum)
 Please go to the ER at [facility] to be evaluated for your shortness of breath.

## 2024-04-04 NOTE — ED Triage Notes (Signed)
 Patient states shortness of breath for about one week and productive cough for a few days; was diagnosed with Pneumonia at [facility]. Patient on 4L [place] at baseline.

## 2024-04-05 DIAGNOSIS — J441 Chronic obstructive pulmonary disease with (acute) exacerbation: Secondary | ICD-10-CM | POA: Diagnosis not present

## 2024-04-05 DIAGNOSIS — R197 Diarrhea, unspecified: Secondary | ICD-10-CM | POA: Diagnosis present

## 2024-04-05 LAB — RESPIRATORY PANEL BY PCR

## 2024-04-05 LAB — CBC
HCT: 33.4 % — ABNORMAL LOW (ref 36.0–46.0)
Hemoglobin: 10.6 g/dL — ABNORMAL LOW (ref 12.0–15.0)
MCH: 29.6 pg (ref 26.0–34.0)
MCHC: 31.7 g/dL (ref 30.0–36.0)
MCV: 93.3 fL (ref 80.0–100.0)
Platelets: 153 K/uL (ref 150–400)
RBC: 3.58 MIL/uL — ABNORMAL LOW (ref 3.87–5.11)
RDW: 12.9 % (ref 11.5–15.5)
WBC: 2.5 K/uL — ABNORMAL LOW (ref 4.0–10.5)
nRBC: 0 % (ref 0.0–0.2)

## 2024-04-05 LAB — BASIC METABOLIC PANEL WITH GFR
Anion gap: 6 (ref 5–15)
BUN: 30 mg/dL — ABNORMAL HIGH (ref 8–23)
CO2: 30 mmol/L (ref 22–32)
Calcium: 10.4 mg/dL — ABNORMAL HIGH (ref 8.9–10.3)
Chloride: 103 mmol/L (ref 98–111)
Creatinine, Ser: 1.08 mg/dL — ABNORMAL HIGH (ref 0.44–1.00)
GFR, Estimated: 53 mL/min — ABNORMAL LOW (ref >60)
Glucose, Bld: 246 mg/dL — ABNORMAL HIGH (ref 70–99)
Potassium: 4.7 mmol/L (ref 3.5–5.1)
Sodium: 139 mmol/L (ref 135–145)

## 2024-04-05 LAB — BLOOD GAS, VENOUS
Acid-Base Excess: 5.9 mmol/L — ABNORMAL HIGH (ref 0.0–2.0)
Bicarbonate: 35.1 mmol/L — ABNORMAL HIGH (ref 20.0–28.0)
O2 Saturation: 91.1 %
Patient temperature: 37
pCO2, Ven: 73 mmHg (ref 44–60)
pH, Ven: 7.29 (ref 7.25–7.43)
pO2, Ven: 56 mmHg — ABNORMAL HIGH (ref 32–45)

## 2024-04-05 LAB — PRO BRAIN NATRIURETIC PEPTIDE: Pro Brain Natriuretic Peptide: 609 pg/mL — ABNORMAL HIGH (ref <300.0)

## 2024-04-05 MED ORDER — MORPHINE SULFATE (PF) 2 MG/ML IV SOLN
2.0000 mg | INTRAVENOUS | Status: DC | PRN
  Administered 2024-04-06: 2 mg via INTRAVENOUS
  Filled 2024-04-05: qty 1

## 2024-04-05 MED ORDER — ATORVASTATIN CALCIUM 10 MG PO TABS
10.0000 mg | ORAL_TABLET | Freq: Every day | ORAL | Status: DC
  Administered 2024-04-05 – 2024-04-06 (×2): 10 mg via ORAL
  Filled 2024-04-05 (×2): qty 1

## 2024-04-05 MED ORDER — ADULT MULTIVITAMIN W/MINERALS CH
1.0000 | ORAL_TABLET | Freq: Every day | ORAL | Status: DC
  Administered 2024-04-05: 1 via ORAL
  Filled 2024-04-05: qty 1

## 2024-04-05 MED ORDER — FUROSEMIDE 10 MG/ML IJ SOLN: 40.0000 mg | Freq: Once | INTRAMUSCULAR | Status: DC

## 2024-04-05 MED ORDER — VITAMIN B-12 1000 MCG PO TABS
1000.0000 ug | ORAL_TABLET | Freq: Every day | ORAL | Status: DC
  Administered 2024-04-05 – 2024-04-06 (×2): 1000 ug via ORAL
  Filled 2024-04-05: qty 1
  Filled 2024-04-05: qty 2

## 2024-04-05 MED ORDER — ENOXAPARIN SODIUM 40 MG/0.4ML IJ SOSY
40.0000 mg | PREFILLED_SYRINGE | INTRAMUSCULAR | Status: DC
  Administered 2024-04-05: 30 mg via SUBCUTANEOUS
  Administered 2024-04-06: 40 mg via SUBCUTANEOUS
  Filled 2024-04-05: qty 0.4

## 2024-04-05 MED ORDER — SPIRONOLACTONE 25 MG PO TABS
25.0000 mg | ORAL_TABLET | Freq: Every day | ORAL | Status: DC
  Administered 2024-04-05 – 2024-04-06 (×2): 25 mg via ORAL
  Filled 2024-04-05 (×2): qty 1

## 2024-04-05 MED ORDER — MAGNESIUM SULFATE 2 GM/50ML IV SOLN
2.0000 g | Freq: Once | INTRAVENOUS | Status: AC
  Administered 2024-04-05: 2 g via INTRAVENOUS
  Filled 2024-04-05: qty 50

## 2024-04-05 MED ORDER — ARFORMOTEROL TARTRATE 15 MCG/2ML IN NEBU
15.0000 ug | INHALATION_SOLUTION | Freq: Two times a day (BID) | RESPIRATORY_TRACT | Status: DC
  Administered 2024-04-05 – 2024-04-06 (×2): 15 ug via RESPIRATORY_TRACT
  Filled 2024-04-05 (×4): qty 2

## 2024-04-05 MED ORDER — FUROSEMIDE 10 MG/ML IJ SOLN
40.0000 mg | Freq: Once | INTRAMUSCULAR | Status: AC
  Administered 2024-04-05: 40 mg via INTRAVENOUS
  Filled 2024-04-05: qty 4

## 2024-04-05 MED ORDER — AZITHROMYCIN 500 MG PO TABS
500.0000 mg | ORAL_TABLET | Freq: Every day | ORAL | Status: AC
  Administered 2024-04-05: 500 mg via ORAL
  Filled 2024-04-05: qty 1

## 2024-04-05 MED ORDER — ROPINIROLE HCL 1 MG PO TABS
1.0000 mg | ORAL_TABLET | Freq: Three times a day (TID) | ORAL | Status: DC
  Administered 2024-04-05 – 2024-04-06 (×4): 1 mg via ORAL
  Filled 2024-04-05 (×5): qty 1

## 2024-04-05 MED ORDER — AZITHROMYCIN 250 MG PO TABS
250.0000 mg | ORAL_TABLET | Freq: Every day | ORAL | Status: DC
  Administered 2024-04-06: 250 mg via ORAL
  Filled 2024-04-05: qty 1

## 2024-04-05 MED ORDER — FUROSEMIDE 20 MG PO TABS
20.0000 mg | ORAL_TABLET | Freq: Every day | ORAL | Status: DC
  Filled 2024-04-05: qty 1

## 2024-04-05 MED ORDER — AMLODIPINE BESYLATE 5 MG PO TABS
5.0000 mg | ORAL_TABLET | Freq: Every day | ORAL | Status: DC
  Administered 2024-04-05 – 2024-04-06 (×2): 5 mg via ORAL
  Filled 2024-04-05 (×2): qty 1

## 2024-04-05 MED ORDER — FUROSEMIDE 40 MG PO TABS
20.0000 mg | ORAL_TABLET | Freq: Every day | ORAL | Status: DC
  Filled 2024-04-05: qty 1

## 2024-04-05 MED ORDER — CLOPIDOGREL BISULFATE 75 MG PO TABS
75.0000 mg | ORAL_TABLET | Freq: Every day | ORAL | Status: DC
  Administered 2024-04-05 – 2024-04-06 (×2): 75 mg via ORAL
  Filled 2024-04-05 (×2): qty 1

## 2024-04-05 MED ORDER — METHYLPREDNISOLONE SODIUM SUCC 125 MG IJ SOLR
80.0000 mg | Freq: Every day | INTRAMUSCULAR | Status: DC
  Administered 2024-04-05: 80 mg via INTRAVENOUS
  Filled 2024-04-05 (×2): qty 2

## 2024-04-05 MED ORDER — TRAMADOL HCL 50 MG PO TABS: 50.0000 mg | ORAL_TABLET | Freq: Four times a day (QID) | ORAL | Status: DC | PRN

## 2024-04-05 MED ORDER — LOSARTAN POTASSIUM 50 MG PO TABS
100.0000 mg | ORAL_TABLET | Freq: Every day | ORAL | Status: DC
  Administered 2024-04-05 – 2024-04-06 (×2): 100 mg via ORAL
  Filled 2024-04-05 (×2): qty 2

## 2024-04-05 MED ORDER — LORAZEPAM 2 MG/ML IJ SOLN
0.5000 mg | INTRAMUSCULAR | Status: DC | PRN
  Administered 2024-04-05 – 2024-04-06 (×5): 0.5 mg via INTRAVENOUS
  Filled 2024-04-05 (×5): qty 1

## 2024-04-05 NOTE — Progress Notes (Signed)
 PHARMACIST - PHYSICIAN COMMUNICATION  CONCERNING:  Enoxaparin  (Lovenox ) for DVT Prophylaxis    RECOMMENDATION: Patient was prescribed enoxaprin 30mg  q24 hours for VTE prophylaxis.   Filed Weights   04/04/24 1557  Weight: 54.4 kg (120 lb)    Body mass index is 25.97 kg/m.  Estimated Creatinine Clearance: 30.9 mL/min (A) (by C-G formula based on SCr of 1.08 mg/dL (H)).  CrCl improving.   Patient is candidate for enoxaparin  40mg  every 24 hours based on CrCl >2ml/min or Weight >45kg  DESCRIPTION: Pharmacy has adjusted enoxaparin  dose per [facility] policy.  Patient is now receiving enoxaparin  40 mg every 24 hours    [name], PharmD, BCPS Clinical Pharmacist 04/05/2024 9:10 AM

## 2024-04-05 NOTE — ED Notes (Signed)
 RT back at bedside to place on bipap.

## 2024-04-05 NOTE — ED Notes (Signed)
 Pt moved to hospital bed for comfort. Respiratory placed on BiPAP

## 2024-04-05 NOTE — Progress Notes (Signed)
 PROGRESS NOTE    Holly  GORMAN Bean  FMW:[id number] DOB: June 12, 1945 DOA: 04/04/2024 PCP: Alyse Bradley, MD    Brief Narrative:   78 y.o. female with medical history significant of COPD on 3.5 L of O2, HTN, HLD, depression, history of breast cancer 2012 on anastrozole , history of colon cancer 2012, history of SDH, history of GI bleeding, chronic pain on buprenorphine  patch weekly, who presents with SOB.   Patient states that her shortness of breath has been progressively worsening for 1 week.  She has cough with little mucus production.  She has intermittent front chest pain, currently no active chest pain.  No fever or chills.  Patient does not have nausea, vomiting, abdominal pain.  She reports diarrhea, 3-4 times of diarrhea each day.  No symptoms of UTI. Per report, patient has chronic right lower leg ulcer. She was seen in [place] at the [facility] heart vascular clinic. Her lower legs are wrapped up today. She refused my examination of her lower legs.   Patient was seen in urgent care, found to have oxygen  desaturation to 73% on 4 L oxygen  (patient is 3.5 L oxygen  at baseline).  Patient was sent to ED for further evaluation and treatment.  At arrival to ED, pt has severe respiratory distress, using accessory muscle for breathing, started 5 L oxygen  with 97% saturation, but then developed respiratory distress again, BiPAP is started.  11/15: Attempted twice to wean patient off BiPAP and on nasal cannula.  Unfortunately breathing became labored and patient was placed back on NIPPV.  Assessment & Plan:   Principal Problem:   COPD exacerbation (HCC) Active Problems:   Acute on chronic respiratory failure with hypoxia (HCC)   Chronic diastolic CHF (congestive heart failure) (HCC)   HTN (hypertension)   Chronic kidney disease, stage 3b (HCC)   HLD (hyperlipidemia)   Hypercalcemia   Diarrhea   Anxiety and depression   Chronic back pain   Acute on chronic respiratory failure with hypoxia due  to COPD exacerbation (HCC): Increased oxygen  demand with increased work of breathing Plan: Continue PCU status Scheduled bronchodilators Every 4 hours DuoNeb Twice daily Brovana Solu-Medrol  80 mg daily NIPPV, wean as tolerated  Chronic diastolic CHF (congestive heart failure) (HCC):  2D echo on 05/02/2023 showed EF of 65-70% with grade 1 diastolic dysfunction.   No pulm edema on chest x-ray.  Does not seem to have CHF exacerbation. Plan: Dose Lasix  40 mg IV x 1 today Resume home Lasix  and Aldactone  11/16    HTN (hypertension) -IV hydralazine  as needed - Continue amlodipine , Cozaar    Chronic kidney disease, stage 3b (HCC):  Renal function stable - Serial BMP   HLD (hyperlipidemia) - Continue Lipitor   Hypercalcemia: Calcium  10.7.  This is chronic issue.  Etiology is not clear. -Check PTH, PTH related peptide and vitamin D1,25, pending - Hold vitamin D  supplement   Diarrhea -Check C. difficile and a GI pathogen panel, pending   Anxiety and depression - Continue Requip    Chronic back pain - Patient is on buprenorphine  patch weekly - Continue home as needed tramadol    DVT prophylaxis: SQL Code Status: DNR Family Communication: None Disposition Plan: Status is: Inpatient Remains inpatient appropriate because: COPD exacerbation   Level of care: Progressive  Consultants:  None  Procedures:  No  Antimicrobials: Zithromax    Subjective: Seen and examined.  Appears fatigued and short of breath.  No pain complaints.  States that she wants a cup of coffee.  Objective: Vitals:  04/05/24 0939 04/05/24 0950 04/05/24 1000 04/05/24 1400  BP:   (!) 167/74 (!) 137/58  Pulse: 100  99 74  Resp: 19  (!) 24 16  Temp:  97.6 F (36.4 C)    TempSrc:  Oral    SpO2: 90%  100% 92%  Weight:      Height:        Intake/Output Summary (Last 24 hours) at 04/05/2024 1421 Last data filed at 04/05/2024 1056 Gross per 24 hour  Intake 50 ml  Output 500 ml  Net -450 ml    Filed Weights   04/04/24 1557  Weight: 54.4 kg    Examination:  General exam: Appears fatigued and chronically ill Respiratory system: Diminished breath sounds bilaterally.  No wheeze.  Tachypneic.  BiPAP Cardiovascular system: S1-S2, tachycardic, regular rhythm, no murmurs, no pedal edema Gastrointestinal system: Thin, soft, NT/ND, normal bowel sounds Central nervous system: Alert and oriented. No focal neurological deficits. Extremities: Symmetric 5 x 5 power. Skin: No rashes, lesions or ulcers Psychiatry: Judgement and insight appear normal. Mood & affect appropriate.     Data Reviewed: I have personally reviewed following labs and imaging studies  CBC: Recent Labs  Lab 04/04/24 1600 04/05/24 0331  WBC 3.9* 2.5*  HGB 11.4* 10.6*  HCT 36.9 33.4*  MCV 95.6 93.3  PLT 167 153   Basic Metabolic Panel: Recent Labs  Lab 04/04/24 1600 04/05/24 0331  NA 141 139  K 4.5 4.7  CL 102 103  CO2 30 30  GLUCOSE 113* 246*  BUN 31* 30*  CREATININE 1.20* 1.08*  CALCIUM  10.7* 10.4*   GFR: Estimated Creatinine Clearance: 30.9 mL/min (A) (by C-G formula based on SCr of 1.08 mg/dL (H)). Liver Function Tests: No results for input(s): AST, ALT, ALKPHOS, BILITOT, PROT, ALBUMIN in the last 168 hours. No results for input(s): LIPASE, AMYLASE in the last 168 hours. No results for input(s): AMMONIA in the last 168 hours. Coagulation Profile: No results for input(s): INR, PROTIME in the last 168 hours. Cardiac Enzymes: No results for input(s): CKTOTAL, CKMB, CKMBINDEX, TROPONINI in the last 168 hours. BNP (last 3 results) Recent Labs    04/05/24 0331  PROBNP 609.0*   HbA1C: No results for input(s): HGBA1C in the last 72 hours. CBG: No results for input(s): GLUCAP in the last 168 hours. Lipid Profile: No results for input(s): CHOL, HDL, LDLCALC, TRIG, CHOLHDL, LDLDIRECT in the last 72 hours. Thyroid  Function Tests: No results for  input(s): TSH, T4TOTAL, FREET4, T3FREE, THYROIDAB in the last 72 hours. Anemia Panel: No results for input(s): VITAMINB12, FOLATE, FERRITIN, TIBC, IRON , RETICCTPCT in the last 72 hours. Sepsis Labs: No results for input(s): PROCALCITON, LATICACIDVEN in the last 168 hours.  Recent Results (from the past 240 hours)  Respiratory (~20 pathogens) panel by PCR     Status: Abnormal   Collection Time: 04/05/24 12:35 AM   Specimen: Nasopharyngeal Swab; Respiratory  Result Value Ref Range Status   Adenovirus NOT DETECTED NOT DETECTED Final   Coronavirus 229E NOT DETECTED NOT DETECTED Final    Comment: (NOTE) The Coronavirus on the Respiratory Panel, DOES NOT test for the novel  Coronavirus (2019 nCoV)    Coronavirus HKU1 NOT DETECTED NOT DETECTED Final   Coronavirus NL63 NOT DETECTED NOT DETECTED Final   Coronavirus OC43 NOT DETECTED NOT DETECTED Final   Metapneumovirus DETECTED (A) NOT DETECTED Final   Rhinovirus / Enterovirus NOT DETECTED NOT DETECTED Final   Influenza A NOT DETECTED NOT DETECTED Final   Influenza B  NOT DETECTED NOT DETECTED Final   Parainfluenza Virus 1 NOT DETECTED NOT DETECTED Final   Parainfluenza Virus 2 NOT DETECTED NOT DETECTED Final   Parainfluenza Virus 3 NOT DETECTED NOT DETECTED Final   Parainfluenza Virus 4 NOT DETECTED NOT DETECTED Final   Respiratory Syncytial Virus NOT DETECTED NOT DETECTED Final   Bordetella pertussis NOT DETECTED NOT DETECTED Final   Bordetella Parapertussis NOT DETECTED NOT DETECTED Final   Chlamydophila pneumoniae NOT DETECTED NOT DETECTED Final   Mycoplasma pneumoniae NOT DETECTED NOT DETECTED Final    Comment: Performed at [facility], [address] [place], [place] [address]         Radiology Studies: DG Chest 2 View Result Date: 04/04/2024 CLINICAL DATA:  Shortness of breath for the past week and productive cough for a few days. Ex-smoker. EXAM: CHEST - 2 VIEW COMPARISON:  05/12/2023  FINDINGS: Borderline enlarged cardiac silhouette. Tortuous and partially calcified thoracic aorta. Hyperexpanded lungs with mild-to-moderate peribronchial thickening and chronic accentuation of the interstitial markings. Small right upper lobe calcified granulomata. Stable mildly prominent bilateral hilar pulmonary arteries. Stable bilateral shoulder prostheses, lumbar spine fixation hardware and thoracic spine degenerative changes. IMPRESSION: 1. No acute abnormality. 2. Mild-to-moderate bronchitic changes and mild changes of COPD. 3. Stable mildly prominent bilateral hilar pulmonary arteries, suggesting pulmonary arterial hypertension. Electronically Signed   By: Elspeth Bathe M.D.   On: 04/04/2024 16:58        Scheduled Meds:  amLODipine   5 mg Oral Daily   arformoterol  15 mcg Nebulization BID   atorvastatin   10 mg Oral Daily   [START ON 04/06/2024] azithromycin   250 mg Oral Daily   clopidogrel   75 mg Oral Daily   cyanocobalamin   1,000 mcg Oral Daily   enoxaparin  (LOVENOX ) injection  40 mg Subcutaneous Q24H   [START ON 04/06/2024] furosemide   20 mg Oral Daily   ipratropium-albuterol   3 mL Nebulization Q4H   losartan   100 mg Oral Daily   methylPREDNISolone  (SOLU-MEDROL ) injection  80 mg Intravenous Daily   multivitamin with minerals  1 tablet Oral Q1400   rOPINIRole   1 mg Oral TID   spironolactone   25 mg Oral Daily   Continuous Infusions:   LOS: 1 day     Calvin KATHEE Robson, MD [facility]   If 7PM-7AM, please contact night-coverage  04/05/2024, 2:21 PM

## 2024-04-05 NOTE — ED Notes (Signed)
 Contacted respiratory to come assess pt and place on Bipap

## 2024-04-05 NOTE — ED Notes (Signed)
 Called RT to come evaluate pt. Pt has desaturated to 89-90% on 6L and refuses to wear Bipap. Pt refuses to take pills without orange juice. Orange juice provided. Pt states just give me a second. Hospitalist made aware.

## 2024-04-05 NOTE — ED Notes (Signed)
 ED tech warmed second breakfast tray up for pt because food was not warm enough.

## 2024-04-05 NOTE — ED Notes (Signed)
 Pt called out to check brief. Pt dry.

## 2024-04-05 NOTE — ED Notes (Signed)
 Contaced provider about pt's increased work of breathing with O2 sats at highest setting. Neb txt to be given. Provider will order Bipap.

## 2024-04-05 NOTE — ED Notes (Signed)
 RT going to place pt on HFNC.

## 2024-04-05 NOTE — ED Notes (Signed)
 2nd breakfast was ordered for pt. Pt stated that her breakfast was barely warm. This NT warmed pt's breakfast. Pt requested for this NT to put butter and syrup on her french toast. Pt requested to this NT to cut up her french toast. Pt asked for this NT to put sugar and syrup in her oatmeal because she insisted that she could not. Pt asked for a hot cup of coffee.

## 2024-04-05 NOTE — ED Notes (Signed)
 Pt calling out because her brief is wet. Checked brief, it is dry.

## 2024-04-05 NOTE — ED Notes (Signed)
 Hospitalist made aware respiratory status(labored, purse lip) and pt refusing to wear bipap. Called pharmacy to verify meds.

## 2024-04-05 NOTE — ED Notes (Signed)
 Pt has called on call bell several times in the last 15 minutes. Pt called out this time to ask why she had an extra pulse ox on. Hot breakfast tray given to pt.

## 2024-04-05 NOTE — ED Notes (Signed)
 RT at bedside to eval pt. Pt wants hot food, called dietary.

## 2024-04-05 NOTE — ED Notes (Signed)
 Pt is much more labored now. Called RT to place pt back on bipap.

## 2024-04-05 NOTE — ED Notes (Signed)
 Pt removed bipap mask. Pt refuses to wear bipap mask. Pt on 6L [place] now. Pt going to eat breakfast.

## 2024-04-06 DIAGNOSIS — Z515 Encounter for palliative care: Secondary | ICD-10-CM | POA: Diagnosis not present

## 2024-04-06 DIAGNOSIS — F419 Anxiety disorder, unspecified: Secondary | ICD-10-CM | POA: Diagnosis not present

## 2024-04-06 DIAGNOSIS — J441 Chronic obstructive pulmonary disease with (acute) exacerbation: Secondary | ICD-10-CM | POA: Diagnosis not present

## 2024-04-06 DIAGNOSIS — Z7189 Other specified counseling: Secondary | ICD-10-CM

## 2024-04-06 DIAGNOSIS — J9621 Acute and chronic respiratory failure with hypoxia: Secondary | ICD-10-CM | POA: Diagnosis not present

## 2024-04-06 DIAGNOSIS — G894 Chronic pain syndrome: Secondary | ICD-10-CM

## 2024-04-06 MED ORDER — GLYCOPYRROLATE 0.2 MG/ML IJ SOLN
0.2000 mg | INTRAMUSCULAR | Status: DC | PRN
  Administered 2024-04-06: 0.2 mg via INTRAVENOUS
  Filled 2024-04-06: qty 1

## 2024-04-06 MED ORDER — HALOPERIDOL LACTATE 2 MG/ML PO CONC: 2.0000 mg | Freq: Four times a day (QID) | ORAL | Status: DC | PRN

## 2024-04-06 MED ORDER — MORPHINE SULFATE (CONCENTRATE) 10 MG /0.5 ML PO SOLN: 5.0000 mg | ORAL | Status: DC | PRN

## 2024-04-06 MED ORDER — ACETAMINOPHEN 650 MG RE SUPP
650.0000 mg | Freq: Four times a day (QID) | RECTAL | Status: DC | PRN
Start: 2024-04-06 — End: 2024-04-08

## 2024-04-06 MED ORDER — HALOPERIDOL LACTATE 5 MG/ML IJ SOLN
2.0000 mg | Freq: Four times a day (QID) | INTRAMUSCULAR | Status: DC | PRN
  Administered 2024-04-06: 2 mg via INTRAVENOUS
  Filled 2024-04-06: qty 1

## 2024-04-06 MED ORDER — ONDANSETRON 4 MG PO TBDP: 4.0000 mg | ORAL_TABLET | Freq: Four times a day (QID) | ORAL | Status: DC | PRN

## 2024-04-06 MED ORDER — LORAZEPAM 2 MG/ML IJ SOLN
1.0000 mg | INTRAMUSCULAR | Status: DC | PRN
  Administered 2024-04-06: 1 mg via INTRAVENOUS
  Filled 2024-04-06: qty 1

## 2024-04-06 MED ORDER — METHYLPREDNISOLONE SODIUM SUCC 125 MG IJ SOLR
80.0000 mg | Freq: Every day | INTRAMUSCULAR | Status: DC
  Administered 2024-04-06: 80 mg via INTRAVENOUS
  Filled 2024-04-06: qty 2

## 2024-04-06 MED ORDER — FUROSEMIDE 10 MG/ML IJ SOLN
40.0000 mg | Freq: Once | INTRAMUSCULAR | Status: AC
  Administered 2024-04-06: 40 mg via INTRAVENOUS
  Filled 2024-04-06: qty 4

## 2024-04-06 MED ORDER — METHYLPREDNISOLONE SODIUM SUCC 125 MG IJ SOLR: 80.0000 mg | Freq: Two times a day (BID) | INTRAMUSCULAR | Status: DC

## 2024-04-06 MED ORDER — BUPRENORPHINE 10 MCG/HR TD PTWK: 1.0000 | MEDICATED_PATCH | TRANSDERMAL | Status: DC

## 2024-04-06 MED ORDER — HYDROMORPHONE BOLUS VIA INFUSION
0.2500 mg | INTRAVENOUS | Status: DC | PRN
  Administered 2024-04-06 – 2024-04-07 (×7): 1 mg via INTRAVENOUS

## 2024-04-06 MED ORDER — HALOPERIDOL 2 MG PO TABS: 2.0000 mg | ORAL_TABLET | Freq: Four times a day (QID) | ORAL | Status: DC | PRN

## 2024-04-06 MED ORDER — ACETAMINOPHEN 325 MG PO TABS: 650.0000 mg | ORAL_TABLET | Freq: Four times a day (QID) | ORAL | Status: DC | PRN

## 2024-04-06 MED ORDER — POLYVINYL ALCOHOL 1.4 % OP SOLN: 1.0000 [drp] | Freq: Four times a day (QID) | OPHTHALMIC | Status: DC | PRN

## 2024-04-06 MED ORDER — GLYCOPYRROLATE 0.2 MG/ML IJ SOLN
0.2000 mg | INTRAMUSCULAR | Status: DC | PRN
  Filled 2024-04-06: qty 1

## 2024-04-06 MED ORDER — GLYCOPYRROLATE 1 MG PO TABS
1.0000 mg | ORAL_TABLET | ORAL | Status: DC | PRN
Start: 2024-04-06 — End: 2024-04-08

## 2024-04-06 MED ORDER — ONDANSETRON HCL 4 MG/2ML IJ SOLN: 4.0000 mg | Freq: Four times a day (QID) | INTRAMUSCULAR | Status: DC | PRN

## 2024-04-06 MED ORDER — DIPHENHYDRAMINE HCL 50 MG/ML IJ SOLN: 25.0000 mg | INTRAMUSCULAR | Status: DC | PRN

## 2024-04-06 MED ORDER — SODIUM CHLORIDE 0.9 % IV SOLN
2.0000 g | INTRAVENOUS | Status: DC
  Administered 2024-04-06: 2 g via INTRAVENOUS
  Filled 2024-04-06: qty 20

## 2024-04-06 MED ORDER — BIOTENE DRY MOUTH MT LIQD
15.0000 mL | Freq: Two times a day (BID) | OROMUCOSAL | Status: DC
  Administered 2024-04-06: 15 mL via TOPICAL

## 2024-04-06 MED ORDER — HYDROMORPHONE HCL-NACL 50-0.9 MG/50ML-% IV SOLN
2.0000 mg/h | INTRAVENOUS | Status: DC
  Administered 2024-04-06 – 2024-04-07 (×2): 2 mg/h via INTRAVENOUS
  Administered 2024-04-07: 3 mg/h via INTRAVENOUS
  Filled 2024-04-06 (×3): qty 50

## 2024-04-06 NOTE — Progress Notes (Signed)
 PROGRESS NOTE    Holly  S Bean  FMW:[id number] DOB: [birth year] DOA: 04/04/2024 PCP: Alyse Bradley, MD    Brief Narrative:   78 y.o. female with medical history significant of COPD on 3.5 L of O2, HTN, HLD, depression, history of breast cancer 2012 on anastrozole , history of colon cancer 2012, history of SDH, history of GI bleeding, chronic pain on buprenorphine  patch weekly, who presents with SOB.   Patient states that her shortness of breath has been progressively worsening for 1 week.  She has cough with little mucus production.  She has intermittent front chest pain, currently no active chest pain.  No fever or chills.  Patient does not have nausea, vomiting, abdominal pain.  She reports diarrhea, 3-4 times of diarrhea each day.  No symptoms of UTI. Per report, patient has chronic right lower leg ulcer. She was seen in [place] at the [facility] heart vascular clinic. Her lower legs are wrapped up today. She refused my examination of her lower legs.   Patient was seen in urgent care, found to have oxygen  desaturation to 73% on 4 L oxygen  (patient is 3.5 L oxygen  at baseline).  Patient was sent to ED for further evaluation and treatment.  At arrival to ED, pt has severe respiratory distress, using accessory muscle for breathing, started 5 L oxygen  with 97% saturation, but then developed respiratory distress again, BiPAP is started.  11/15: Attempted twice to wean patient off BiPAP and on nasal cannula.  Unfortunately breathing became labored and patient was placed back on NIPPV.  11/16: Respiratory status remains quite tenuous.  Does respond well to BiPAP however use limited by underlying anxiety.  Assessment & Plan:   Principal Problem:   COPD exacerbation (HCC) Active Problems:   Acute on chronic respiratory failure with hypoxia (HCC)   Chronic diastolic CHF (congestive heart failure) (HCC)   HTN (hypertension)   Chronic kidney disease, stage 3b (HCC)   HLD (hyperlipidemia)    Hypercalcemia   Diarrhea   Anxiety and depression   Chronic back pain   Acute on chronic respiratory failure with hypoxia due to COPD exacerbation (HCC): Increased oxygen  demand with increased work of breathing Plan: Continue PCU status.  Continue scheduled bronchodilators every 4 DuoNeb twice daily Brovana.  Increase Solu-Medrol  to 80 mg twice daily.  Start empiric Rocephin.  Lengthy discussion with patient.  She is reluctant to wear the BiPAP due to anxiety.  This is despite pretreatment with morphine  and Ativan .  Unfortunately patient's lung disease is advanced to the point that there are limited options for intervention at this point.  Patient does respond to BiPAP however has poor tolerance of NIPPV.  Will consult palliative for GOC discussion.  Patient remains a DNR however patient may be a candidate for further de-escalation of care should it fit within her goals.  Chronic diastolic CHF (congestive heart failure) (HCC):  2D echo on 05/02/2023 showed EF of 65-70% with grade 1 diastolic dysfunction.   No pulm edema on chest x-ray.  Does not seem to have CHF exacerbation. Plan: Lasix  40 mg IV daily.  Continue home Aldactone .    HTN (hypertension) -IV hydralazine  as needed - Continue amlodipine , Cozaar    Chronic kidney disease, stage 3b (HCC):  Renal function stable - Serial BMP   HLD (hyperlipidemia) - Continue Lipitor   Hypercalcemia: Calcium  10.7.  This is chronic issue.  Etiology is not clear. -Check PTH, PTH related peptide and vitamin D1,25, pending - Hold vitamin D  supplement   Diarrhea  Solved.  Low suspicion for infectious diarrhea   Anxiety and depression - Continue Requip    Chronic back pain - Patient is on buprenorphine  patch weekly, reordered - Continue home as needed tramadol    DVT prophylaxis: SQL Code Status: DNR Family Communication: None Disposition Plan: Status is: Inpatient Remains inpatient appropriate because: COPD exacerbation   Level of  care: Progressive  Consultants:  None  Procedures:  No  Antimicrobials: Zithromax    Subjective: Very short of breath.  Seen and examined.  Continues with shortness of breath  Objective: Vitals:   04/06/24 0805 04/06/24 0840 04/06/24 1052 04/06/24 1132  BP:   (!) 173/80   Pulse: (!) 106   (!) 128  Resp: (!) 24   16  Temp:      TempSrc:      SpO2: 91% 92%  97%  Weight:      Height:        Intake/Output Summary (Last 24 hours) at 04/06/2024 1147 Last data filed at 04/06/2024 1045 Gross per 24 hour  Intake 60 ml  Output 850 ml  Net -790 ml   Filed Weights   04/04/24 1557 04/06/24 0500  Weight: 54.4 kg 60 kg    Examination:  General exam: Appears fatigued, short of breath Respiratory system: Markedly diminished breath sounds bilaterally.  Tachypneic.  BiPAP Cardiovascular system: S1-S2, tachycardic, regular rhythm, no murmurs, no pedal edema Gastrointestinal system: Thin, soft, NT/ND, normal bowel sounds Central nervous system: Alert and oriented. No focal neurological deficits. Extremities: Symmetric 5 x 5 power. Skin: No rashes, lesions or ulcers Psychiatry: Judgement and insight appear normal. Mood & affect appropriate.     Data Reviewed: I have personally reviewed following labs and imaging studies  CBC: Recent Labs  Lab 04/04/24 1600 04/05/24 0331  WBC 3.9* 2.5*  HGB 11.4* 10.6*  HCT 36.9 33.4*  MCV 95.6 93.3  PLT 167 153   Basic Metabolic Panel: Recent Labs  Lab 04/04/24 1600 04/05/24 0331  NA 141 139  K 4.5 4.7  CL 102 103  CO2 30 30  GLUCOSE 113* 246*  BUN 31* 30*  CREATININE 1.20* 1.08*  CALCIUM  10.7* 10.4*   GFR: Estimated Creatinine Clearance: 32.5 mL/min (A) (by C-G formula based on SCr of 1.08 mg/dL (H)). Liver Function Tests: No results for input(s): AST, ALT, ALKPHOS, BILITOT, PROT, ALBUMIN in the last 168 hours. No results for input(s): LIPASE, AMYLASE in the last 168 hours. No results for input(s):  AMMONIA in the last 168 hours. Coagulation Profile: No results for input(s): INR, PROTIME in the last 168 hours. Cardiac Enzymes: No results for input(s): CKTOTAL, CKMB, CKMBINDEX, TROPONINI in the last 168 hours. BNP (last 3 results) Recent Labs    04/05/24 0331  PROBNP 609.0*   HbA1C: No results for input(s): HGBA1C in the last 72 hours. CBG: No results for input(s): GLUCAP in the last 168 hours. Lipid Profile: No results for input(s): CHOL, HDL, LDLCALC, TRIG, CHOLHDL, LDLDIRECT in the last 72 hours. Thyroid  Function Tests: No results for input(s): TSH, T4TOTAL, FREET4, T3FREE, THYROIDAB in the last 72 hours. Anemia Panel: No results for input(s): VITAMINB12, FOLATE, FERRITIN, TIBC, IRON , RETICCTPCT in the last 72 hours. Sepsis Labs: No results for input(s): PROCALCITON, LATICACIDVEN in the last 168 hours.  Recent Results (from the past 240 hours)  Respiratory (~20 pathogens) panel by PCR     Status: Abnormal   Collection Time: 04/05/24 12:35 AM   Specimen: Nasopharyngeal Swab; Respiratory  Result Value Ref Range Status  Adenovirus NOT DETECTED NOT DETECTED Final   Coronavirus 229E NOT DETECTED NOT DETECTED Final    Comment: (NOTE) The Coronavirus on the Respiratory Panel, DOES NOT test for the novel  Coronavirus (2019 nCoV)    Coronavirus HKU1 NOT DETECTED NOT DETECTED Final   Coronavirus NL63 NOT DETECTED NOT DETECTED Final   Coronavirus OC43 NOT DETECTED NOT DETECTED Final   Metapneumovirus DETECTED (A) NOT DETECTED Final   Rhinovirus / Enterovirus NOT DETECTED NOT DETECTED Final   Influenza A NOT DETECTED NOT DETECTED Final   Influenza B NOT DETECTED NOT DETECTED Final   Parainfluenza Virus 1 NOT DETECTED NOT DETECTED Final   Parainfluenza Virus 2 NOT DETECTED NOT DETECTED Final   Parainfluenza Virus 3 NOT DETECTED NOT DETECTED Final   Parainfluenza Virus 4 NOT DETECTED NOT DETECTED Final   Respiratory  Syncytial Virus NOT DETECTED NOT DETECTED Final   Bordetella pertussis NOT DETECTED NOT DETECTED Final   Bordetella Parapertussis NOT DETECTED NOT DETECTED Final   Chlamydophila pneumoniae NOT DETECTED NOT DETECTED Final   Mycoplasma pneumoniae NOT DETECTED NOT DETECTED Final    Comment: Performed at [facility], [address] [place], [place] [address]         Radiology Studies: DG Chest 2 View Result Date: 04/04/2024 CLINICAL DATA:  Shortness of breath for the past week and productive cough for a few days. Ex-smoker. EXAM: CHEST - 2 VIEW COMPARISON:  05/12/2023 FINDINGS: Borderline enlarged cardiac silhouette. Tortuous and partially calcified thoracic aorta. Hyperexpanded lungs with mild-to-moderate peribronchial thickening and chronic accentuation of the interstitial markings. Small right upper lobe calcified granulomata. Stable mildly prominent bilateral hilar pulmonary arteries. Stable bilateral shoulder prostheses, lumbar spine fixation hardware and thoracic spine degenerative changes. IMPRESSION: 1. No acute abnormality. 2. Mild-to-moderate bronchitic changes and mild changes of COPD. 3. Stable mildly prominent bilateral hilar pulmonary arteries, suggesting pulmonary arterial hypertension. Electronically Signed   By: Elspeth Bathe M.D.   On: 04/04/2024 16:58        Scheduled Meds:  amLODipine   5 mg Oral Daily   arformoterol  15 mcg Nebulization BID   atorvastatin   10 mg Oral Daily   azithromycin   250 mg Oral Daily   buprenorphine   1 patch Transdermal Weekly   clopidogrel   75 mg Oral Daily   cyanocobalamin   1,000 mcg Oral Daily   enoxaparin  (LOVENOX ) injection  40 mg Subcutaneous Q24H   ipratropium-albuterol   3 mL Nebulization Q4H   losartan   100 mg Oral Daily   methylPREDNISolone  (SOLU-MEDROL ) injection  80 mg Intravenous Q12H   multivitamin with minerals  1 tablet Oral Q1400   rOPINIRole   1 mg Oral TID   spironolactone   25 mg Oral Daily   Continuous Infusions:   cefTRIAXone (ROCEPHIN)  IV 2 g (04/06/24 1144)     LOS: 2 days     Calvin KATHEE Robson, MD [facility]   If 7PM-7AM, please contact night-coverage  04/06/2024, 11:47 AM

## 2024-04-06 NOTE — Consult Note (Cosign Needed Addendum)
 Consultation Note Date: 04/06/2024   Patient Name: Holly  [name]  DOB: Nov 02, 1945  MRN: [id number]  Age / Sex: 78 y.o., female  PCP: Holly Bradley, MD Referring Physician: Jhonny Calvin NOVAK, MD  Reason for Consultation: Establishing goals of care  HPI/Patient Profile: 78 y.o. female  with past medical history of COPD (on 3.5 L home oxygen ), hypertension, hyperlipidemia, depression, breast cancer (2012, on anastrozole ), colon cancer (2012), prior subdural hematoma, gastrointestinal bleeding, and chronic pain managed with a weekly buprenorphine  patch. She presents with shortness of breath. The patient reports progressive worsening of dyspnea over the past week, accompanied by a cough producing minimal mucus. She describes intermittent anterior chest pain, though none currently. She denies fever, chills, nausea, vomiting, or abdominal pain but notes diarrhea occurring 3-4 times daily. No urinary symptoms are reported. She has a chronic right lower leg ulcer.  She went to get evaluated at urgent care, where her oxygen  saturation dropped to 73% on 4 L oxygen  (baseline 3.5 L), prompting transfer to the ED. On arrival, she was in severe respiratory distress, using accessory muscles. Oxygen  was increased to 5 L, improving saturation to 97%, but respiratory distress recurred, necessitating initiation of BiPAP.  ED work-up: Laboratory results showed a WBC count of 3.9 with stable renal function. Vital signs were notable for a normal temperature, blood pressure of 108/49 mmHg, heart rate ranging from 60 to 80 bpm, and respiratory rate improving from 24 to 17 breaths per minute. The patient was subsequently admitted to the Progressive Care Unit for inpatient management.  PMT has been consulted to assist with goals of care conversation. Patient/Family face treatment option decisions, advanced directive decisions and anticipatory care needs.   Family face treatment option  decision, advance directive decisions and anticipatory care needs.   Clinical Assessment and Goals of Care:  I have reviewed medical records including EPIC notes, labs and imaging, assessed the patient and then with patient to attempt to discuss diagnosis prognosis, GOC, EOL wishes, disposition and options.Vital signs reviewed: blood pressure 173/80 mmHg (MAP 109), heart rate 128 bpm, tachypneic, and oxygen  saturation 95-97% on BiPAP. VBG results: pCO2 elevated at 73, pO2 elevated at 56, this supports acute on chronic respiratory failure.    I introduced Palliative Medicine as specialized medical care for people living with serious illness. It focuses on providing relief from the symptoms and stress of a serious illness. The goal is to improve quality of life for both the patient and the family.  Created space and opportunity for family to explore thoughts and feelings regarding patient's current medical condition.   I visited the patient at bedside today. No family members or friends were present during my evaluation. She was lying in bed and appeared to be in  mild distress, currently on BiPAP at 50% FiO?SABRA According to the bedside RN, the patient is not tolerating BiPAP well and remains anxious and restless and keeps on trying to take off BIPAP. She is receiving PRN morphine  and lorazepam  for anxiety. Clinically, she appears critically ill and tachypneic, with ongoing confusion that prevents accurate responses to orientation questions. Her respiratory status remains tenuous. She benefits from BiPAP, but its use is limited by her anxiety.  The bedside RN reported that the patient continues to struggle with anxiety and requires premedication with Ativan  to tolerate BiPAP. She is confirmed as DNR/DNI. We briefly reviewed current interventions,  however, meaningful conversation with patient was not possible due to her confusion, agitation, and sedation from recent medications.  I  informed the patient  briefly that she is on BiPAP for acute and chronic respiratory failure compounded by chronic diastolic congestive heart failure and that her condition is critical with high risk for decompensation. I tried to explore her goals of care, whether to prioritize comfort or continue current interventions within the limits of DNR/DNI, but she was unable to respond meaningfully.  Later in the day, I met with the patient's HCPOA and close friend, [name], accompanied by her sister [name], for a goals-of-care discussion. Holly confirmed that she is the [name] and has documentation to support this. She described herself as a trusted friend of [name] and shared that the patient has experienced a progressive decline in health, including two hospitalizations in recent months. Holly noted that the patient has two children who are minimally involved and reiterated that the patient chose her as HCPOA. She explained that the patient lives alone with her dog, and Holly regularly visits, checks on her, and assists with medical appointments. Previously, the patient walked her dog several times daily using a rollator, but neighbors recently reported that the dog has been roaming freely, suggesting further functional decline. Holly also mentioned that the patient was hospitalized from September to October at [facility] for lower extremity vascular issues.  We reviewed [name]'s understanding of the patient's current medical condition and prognosis, both short- and long-term. She demonstrated a clear understanding of the patient's overall health status. I explained that the patient is currently admitted for acute on chronic respiratory failure and, given her high disease and symptom burden, is at significant risk for decompensation. I emphasized that her lungs have very limited capacity for oxygen  exchange, and even minor illnesses or stressors could rapidly worsen her breathing. Recovery, if possible, would  be difficult and unpredictable.  We discussed goals of care and explored treatment pathways aligned with the patient's values and beliefs, including best- and worst-case scenarios. Options included aggressive interventions versus a comfort-focused approach. Given the patient's poor prognosis, I recommended considering a comfort-focused or hospice pathway. I provided detailed education on hospice philosophy and benefits, emphasizing its holistic approach to care in end-stage illness, supporting the patient while allowing nature to take its course. We reviewed the hospice team's roles, nurses, physicians, social workers, and chaplains, and their ability to provide personal care, family support, and assistance with durable medical equipment, as well as the differences between home and residential hospice. Holly mentioned that home hospice is not feasible in patient's current situation.  Based on our discussion, Holly elected to pursue a comfort-focused pathway and transition the patient to an inpatient hospice facility if eligible. She expressed confidence that this aligns with the patient's wishes, stating, "If Pranika  has a breath to speak, she would want comfort over suffering." Holly shared that they had discussed this extensively in the past and believes the patient would prioritize quality of life over life-prolonging measures that often bring pain and discomfort. I affirmed that this decision is reasonable given the patient's overall poor medical state and offered support. Holly plans to inform the patient's family members so they may visit if they wish. I will notify the medical team of this decision.  Values and goals of care important to patient were attempted to be elicited.   Discussed the importance of continued conversation with family and the medical providers regarding overall plan of care and treatment options, ensuring decisions are within the context of the patient's values and  GOCs.   Questions and concerns were addressed.  PMT will continue to support holistically.  Palliative Symptoms: Shortness of breath, confusion, generalized weakness.   Advance Directives: No ACP documents on file. Holly mentioned she has it at home and plans to bring a copy in for hospital record.    Primary Decision Maker: This patient was previously consulted by the palliative medicine team and seen by Camelia Lewis on May 08, 2023. At that time, she expressed a preference for her friend to serve as her surrogate decision-maker Jesusa Bean) rather than her children should she lose decision-making capacity.    SUMMARY OF RECOMMENDATIONS    Code Status: Transition to DNR-Comfort No escalation in level of care [facility] consult placed for [facility] hospice referral, evaluation for inpatient hospice (04/06/2024) Continue to provide psycho-social and emotional support to patient and family Palliative medicine team will continue to follow.  Comfort cart for family Unrestricted visitations in the setting of EOL (per policy)  Symptom Management:  Hydromorphone  infusion/drip for pain and air hunger Morphine  PRN for pain/air hunger/comfort Robinul  PRN for excessive secretions Ativan  PRN for agitation/anxiety Zofran  PRN for nausea Liquifilm tears PRN for dry eyes Haldol PRN for agitation/anxiety May have comfort feeding Oxygen  PRN for comfort. No escalation.    Palliative Prophylaxis:  Aspiration, Eye Care, Frequent Pain Assessment, and Turn Reposition  Prognosis:  Prognosis is poor given patient's acute on chronic respiratory failure, other significant co morbidities including congestive heart failure and advanced age.   Discharge Planning: To Be Determined      Primary Diagnoses: Present on Admission:  COPD exacerbation (HCC)  Acute on chronic respiratory failure with hypoxia (HCC)  Chronic kidney disease, stage 3b (HCC)  HTN (hypertension)  Anxiety and depression   Chronic diastolic CHF (congestive heart failure) (HCC)  Hypercalcemia  HLD (hyperlipidemia)  Diarrhea  Chronic back pain    Physical Exam Constitutional:      Appearance: She is ill-appearing.  HENT:     Head: Normocephalic and atraumatic.  Pulmonary:     Effort: Tachypnea and accessory muscle usage present.  Abdominal:     Palpations: Abdomen is soft.  Musculoskeletal:     Cervical back: Normal range of motion.     Comments: Generalized weakness   Skin:    General: Skin is warm and dry.  Neurological:     Comments: Confusion/disorientation  Psychiatric:     Comments: Some periods of restlessness/agitation.     Vital Signs: BP (!) 153/95   Pulse (!) 106   Temp 98.2 F (36.8 C)   Resp (!) 24   Ht 4' 9 (1.448 m)   Wt 60 kg   SpO2 92%   BMI 28.62 kg/m  Pain Scale: Not given for pain   Pain Score: 0-No pain   SpO2: SpO2: 92 % O2 Device:SpO2: 92 % O2 Flow Rate: .O2 Flow Rate (L/min): (S) 45 L/min   Palliative Assessment/Data:40 to 50%    Total time: I spent 90 minutes in the care of the patient today in the above activities and documenting the encounter.   Detailed review of medical records (labs, imaging, vital signs), medically appropriate exam, discussed with treatment team, counseling and education to patient, family, & staff, documenting clinical information, coordination of care.     Kathlyne JULIANNA Tracie Mickey, NP  Palliative Medicine Team Team phone # [phone]  Thank you for allowing the Palliative Medicine Team to assist in the care of this patient. Please utilize secure chat with additional questions, if there is no response within 30 minutes please call  the above phone number.  Palliative Medicine Team providers are available by phone from 7am to 7pm daily and can be reached through the team cell phone.  Should this patient require assistance outside of these hours, please call the patient's attending physician.

## 2024-04-06 NOTE — Progress Notes (Signed)
 Home dose of Buprenorphine  10mcg/hr patch removed per Palliative Care provider Kathlyne Bolder, NP at this time. Wasted in black box per pharmacy recommendation with Norleen Pander, RN.

## 2024-04-06 NOTE — TOC CM/SW Note (Signed)
 RNCM received a message via secure chat requesting to have Hospice meet with patient's POA. Saddie from [facility] made aware. RNCM will continue to follow for discharge planning needs.

## 2024-04-06 NOTE — Care Management Important Message (Signed)
 Important Message  Patient Details  Name: [name] MRN: [id number] Date of Birth: 1946-05-03   Important Message Given:  Yes - Medicare IM     [name] 04/06/2024, 6:09 PM

## 2024-04-07 DIAGNOSIS — J441 Chronic obstructive pulmonary disease with (acute) exacerbation: Secondary | ICD-10-CM | POA: Diagnosis not present

## 2024-04-07 LAB — PARATHYROID HORMONE, INTACT (NO CA): PTH: 42 pg/mL (ref 15–65)

## 2024-04-07 LAB — CALCITRIOL (1,25 DI-OH VIT D): Vit D, 1,25-Dihydroxy: 31.5 pg/mL (ref 24.8–81.5)

## 2024-04-16 LAB — PTH-RELATED PEPTIDE: PTH-related peptide: <2 pmol/L

## 2024-04-21 NOTE — Progress Notes (Signed)
 Nutrition Brief Note  Chart reviewed. Pt now transitioning to comfort care.  No further nutrition interventions planned at this time.  Please re-consult as needed.   Margery ORN, RD, LDN, CDCES Registered Dietitian III Certified Diabetes Care and Education Specialist If unable to reach this RD, please use RD Inpatient group chat on secure chat between hours of 8am-4 pm daily

## 2024-04-21 NOTE — Progress Notes (Addendum)
 Daily Progress Note   Patient Name: [name]       Date: Apr 29, 2024 DOB: [birth year]  Age: 78 y.o. MRN#: [id number] Attending Physician: Jhonny Calvin NOVAK, MD Primary Care Physician: Alyse Bradley, MD Admit Date: 04/04/2024  Reason for Consultation/Follow-up: Terminal Care  Subjective: Notes and labs reviewed.  Patient has been transitioned to comfort care.  In to see patient to assess needs.  She does not respond to me.  Patient appears comfortable.  Breathing appears regular but shallow respirations.  It appears she is actively dying.  No changes to symptom management at this time. No family at bedside.   Length of Stay: 3  Current Medications: Scheduled Meds:   antiseptic oral rinse  15 mL Topical BID   rOPINIRole   1 mg Oral TID    Continuous Infusions:  HYDROmorphone  2.5 mg/hr (Apr 29, 2024 0838)    PRN Meds: acetaminophen  **OR** acetaminophen , albuterol , artificial tears, dextromethorphan-guaiFENesin , diphenhydrAMINE , glycopyrrolate  **OR** glycopyrrolate  **OR** glycopyrrolate , haloperidol **OR** haloperidol **OR** haloperidol lactate, HYDROmorphone , morphine  CONCENTRATE **OR** morphine  CONCENTRATE, ondansetron  **OR** ondansetron  (ZOFRAN ) IV  Physical Exam Constitutional:      Comments: Eyes closed  Pulmonary:     Comments: Shallow respirations Skin:    General: Skin is warm and dry.             Vital Signs: BP (!) 173/80   Pulse (!) 128   Temp 98.2 F (36.8 C)   Resp 16   Ht 4' 9 (1.448 m)   Wt 60 kg   SpO2 97%   BMI 28.62 kg/m  SpO2: SpO2: 97 % O2 Device: O2 Device: (S) Bi-PAP O2 Flow Rate: O2 Flow Rate (L/min): (S) 45 L/min  Intake/output summary:  Intake/Output Summary (Last 24 hours) at 04/29/2024 1221 Last data filed at 04/06/2024 1900 Gross per  24 hour  Intake 9.13 ml  Output 650 ml  Net -640.87 ml   LBM: Last BM Date : 04/04/24 Baseline Weight: Weight: 54.4 kg Most recent weight: Weight: 60 kg    Patient Active Problem List   Diagnosis Date Noted   Diarrhea 04/05/2024   COPD exacerbation (HCC) 04/04/2024   Hypercalcemia 04/04/2024   HLD (hyperlipidemia) 04/04/2024   History of left shoulder replacement 12/25/2023   Primary osteoarthritis of one knee, right 09/17/2023   Pressure injury of skin 05/03/2023  Acute on chronic heart failure with preserved ejection fraction (HFpEF) (HCC) 05/02/2023   Acute on chronic respiratory failure with hypoxia (HCC) 05/02/2023   Chronic anemia 05/02/2023   History of GI bleed 05/02/2023   S/P exploratory laparotomy 04/08/23 for perforated gastric ulcer 05/02/2023   Chronic venous insufficiency 03/05/2023   Dysphagia 09/20/2021   Restless leg syndrome 09/20/2021   B12 deficiency 09/19/2021   Chronic respiratory failure with hypoxia (HCC) 09/18/2021   COPD with acute exacerbation (HCC) 11/08/2020   Elevated troponin 11/08/2020   Chronic kidney disease, stage 3b (HCC) 11/08/2020   Shock (HCC) 06/22/2019   Shock circulatory (HCC) 06/21/2019   Ruptured varicose vein 06/21/2019   COPD (chronic obstructive pulmonary disease) (HCC) 06/21/2019   Chronic diastolic CHF (congestive heart failure) (HCC) 06/21/2019   SOB (shortness of breath) 07/15/2018   Laboratory examination 07/15/2018   PAD (peripheral artery disease) 07/15/2018   Centrilobular emphysema (HCC) 07/15/2018   Hx of varicose veins of lower extremity 07/15/2018   Back pain 01/21/2016   Sacroiliac joint dysfunction of left side 12/27/2015   History of colon cancer 09/21/2015   Iron  deficiency anemia 06/04/2015   Syncope 11/05/2014   Narcotic addiction (HCC)    Chronic back pain 10/27/2014   Faintness    Frequent falls 08/22/2014   Facial contusion 08/22/2014   Subdural hematoma (HCC) 08/21/2014   AKI (acute kidney  injury) 01/20/2013   Acute mastitis of right breast 04/05/2012   Right shoulder pain 03/27/2012   Breast erythema most likely secondary to mastitis / cellulitis 03/24/2012   Hyponatremia 03/24/2012   Cancer of colon (HCC) 05/24/2011   Cancer of right breast, stage 1 (HCC) 05/24/2011   Colonic mass 05/03/2011   H/O tobacco use, presenting hazards to health 05/03/2011   Weakness generalized 05/01/2011   Acute blood loss anemia 05/01/2011   Falls frequently 05/01/2011   GI bleed 05/01/2011   Hypokalemia 05/01/2011   HTN (hypertension) 05/01/2011   Anxiety and depression 05/01/2011   Lower extremity edema 05/01/2011    Palliative Care Assessment & Plan   Recommendations/Plan: Patient in the dying process Anticipate hospital death.   No changes recommended to symptom management regimen at this time.  Code Status:    Code Status Orders  (From admission, onward)           Start     Ordered   04/06/24 1429  Do not attempt resuscitation (DNR) - Comfort care  Continuous       Question Answer Comment  If patient has no pulse and is not breathing Do Not Attempt Resuscitation   In Pre-Arrest Conditions (Patient Is Breathing and Has a Pulse) Provide comfort measures. Relieve any mechanical airway obstruction. Avoid transfer unless required for comfort.   Consent: Discussion documented in EHR or advanced directives reviewed      04/06/24 1430           Code Status History     Date Active Date Inactive Code Status Order ID Comments User Context   04/04/2024 2225 04/06/2024 1424 Limited: Do not attempt resuscitation (DNR) -DNR-LIMITED -Do Not Intubate/DNI  [id number]  Niu, Xilin, MD ED   05/02/2023 0316 05/15/2023 2240 Limited: Do not attempt resuscitation (DNR) -DNR-LIMITED -Do Not Intubate/DNI  [id number]  Cleatus Delayne GAILS, MD ED   09/18/2021 2257 09/23/2021 1910 DNR [id number]  Tobie Jorie SAUNDERS, MD ED   11/08/2020 0932 11/09/2020 2129 DNR [id number]  Chotiner, Adine RAMAN, MD Inpatient    11/14/2019 1222 11/15/2019 1848 DNR [id number]  Vernon Velna SAUNDERS, MD ED   06/21/2019 1131 06/22/2019 2103 DNR [id number]  Barbarann Nest, MD ED   03/18/2019 1502 03/18/2019 2135 Full Code [id number]  Ladona Heinz, MD Inpatient   11/05/2014 0453 11/05/2014 1636 Full Code [id number]  Hilma Rankins, MD Inpatient   10/28/2014 0025 10/30/2014 1651 Full Code [id number]  Fernand Elfreda LABOR, MD ED   08/22/2014 0047 08/24/2014 1814 Full Code [id number]  Mavis Ritchie BROCKS, MD ED   01/20/2013 2051 01/22/2013 1605 Full Code [id number]  Billy Junnie HERO, MD Inpatient   03/24/2012 1604 03/30/2012 1447 Full Code [id number]  Lei Asberry RAMAN, RN ED   05/05/2011 1433 05/09/2011 1408 Full Code [id number]  Jake Almarie CROME, RN Inpatient   05/01/2011 0140 05/05/2011 1433 Full Code [id number]  Faucette, Evalene Garre, RN Inpatient      Advance Directive Documentation    Flowsheet Row Most Recent Value  Type of Advance Directive Living will  Pre-existing out of facility DNR order (yellow form or pink MOST form) --  MOST Form in Place? --    Prognosis:  Hours - Days  Camelia Lewis, NP  Please contact Palliative Medicine Team phone at [phone] for questions and concerns.

## 2024-04-21 NOTE — Progress Notes (Signed)
 PROGRESS NOTE    Holly  GORMAN Bean  FMW:[id number] DOB: Nov 07, 1945 DOA: 04/04/2024 PCP: Alyse Bradley, MD    Brief Narrative:   78 y.o. female with medical history significant of COPD on 3.5 L of O2, HTN, HLD, depression, history of breast cancer 2012 on anastrozole , history of colon cancer 2012, history of SDH, history of GI bleeding, chronic pain on buprenorphine  patch weekly, who presents with SOB.   Patient states that her shortness of breath has been progressively worsening for 1 week.  She has cough with little mucus production.  She has intermittent front chest pain, currently no active chest pain.  No fever or chills.  Patient does not have nausea, vomiting, abdominal pain.  She reports diarrhea, 3-4 times of diarrhea each day.  No symptoms of UTI. Per report, patient has chronic right lower leg ulcer. She was seen in [place] at the [facility] heart vascular clinic. Her lower legs are wrapped up today. She refused my examination of her lower legs.   Patient was seen in urgent care, found to have oxygen  desaturation to 73% on 4 L oxygen  (patient is 3.5 L oxygen  at baseline).  Patient was sent to ED for further evaluation and treatment.  At arrival to ED, pt has severe respiratory distress, using accessory muscle for breathing, started 5 L oxygen  with 97% saturation, but then developed respiratory distress again, BiPAP is started.  11/15: Attempted twice to wean patient off BiPAP and on nasal cannula.  Unfortunately breathing became labored and patient was placed back on NIPPV.  11/16: Respiratory status remains quite tenuous.  Does respond well to BiPAP however use limited by underlying anxiety.  11-Apr-2024: Appreciate assistance from palliative care service.  Patient transition to full comfort measures.  Dilaudid  gtt. initiated.  Assessment & Plan:   Principal Problem:   COPD exacerbation (HCC) Active Problems:   Acute on chronic respiratory failure with hypoxia (HCC)   Chronic diastolic  CHF (congestive heart failure) (HCC)   HTN (hypertension)   Chronic kidney disease, stage 3b (HCC)   HLD (hyperlipidemia)   Hypercalcemia   Diarrhea   Anxiety and depression   Chronic back pain   Acute on chronic respiratory failure with hypoxia due to COPD exacerbation (HCC): Increased oxygen  demand with increased work of breathing Plan: Patient has been transition to full comfort measures.  All medications not focused on patient comfort have been discontinued.  Proceed with Dilaudid  gtt.  Escalate to patient comfort.  Appreciate assistance from palliative care service.  Hospice liaison made aware.  Hospice referral in place.  Patient is appropriate for transition to the inpatient hospice unit   DVT prophylaxis: SQL Code Status: DNR Family Communication: None Disposition Plan: Status is: Inpatient Remains inpatient appropriate because: COPD exacerbation   Level of care: Progressive  Consultants:  None  Procedures:  No  Antimicrobials:   Subjective: Seen and examined.  Calm while on Dilaudid  gtt.  Objective: Vitals:   04/06/24 0822 04/06/24 0840 04/06/24 1052 04/06/24 1132  BP:   (!) 173/80   Pulse: 87   (!) 128  Resp: (!) 26   16  Temp:      TempSrc:      SpO2: 90% 92%  97%  Weight:      Height:        Intake/Output Summary (Last 24 hours) at 04/11/2024 1141 Last data filed at 04/06/2024 1900 Gross per 24 hour  Intake 9.13 ml  Output 650 ml  Net -640.87 ml   [name]  Weights   04/04/24 1557 04/06/24 0500  Weight: 54.4 kg 60 kg    Examination:  Limited exam due to comfort measure status   general exam: Appears frail.  Normal work of breathing while on Dilaudid  gtt. Respiratory system: Unlabored respirations Cardiovascular system: 1 S2, RRR, no murmurs, no pedal edema   Data Reviewed: I have personally reviewed following labs and imaging studies  CBC: Recent Labs  Lab 04/04/24 1600 04/05/24 0331  WBC 3.9* 2.5*  HGB 11.4* 10.6*  HCT 36.9  33.4*  MCV 95.6 93.3  PLT 167 153   Basic Metabolic Panel: Recent Labs  Lab 04/04/24 1600 04/05/24 0331  NA 141 139  K 4.5 4.7  CL 102 103  CO2 30 30  GLUCOSE 113* 246*  BUN 31* 30*  CREATININE 1.20* 1.08*  CALCIUM  10.7* 10.4*   GFR: Estimated Creatinine Clearance: 32.5 mL/min (A) (by C-G formula based on SCr of 1.08 mg/dL (H)). Liver Function Tests: No results for input(s): AST, ALT, ALKPHOS, BILITOT, PROT, ALBUMIN in the last 168 hours. No results for input(s): LIPASE, AMYLASE in the last 168 hours. No results for input(s): AMMONIA in the last 168 hours. Coagulation Profile: No results for input(s): INR, PROTIME in the last 168 hours. Cardiac Enzymes: No results for input(s): CKTOTAL, CKMB, CKMBINDEX, TROPONINI in the last 168 hours. BNP (last 3 results) Recent Labs    04/05/24 0331  PROBNP 609.0*   HbA1C: No results for input(s): HGBA1C in the last 72 hours. CBG: No results for input(s): GLUCAP in the last 168 hours. Lipid Profile: No results for input(s): CHOL, HDL, LDLCALC, TRIG, CHOLHDL, LDLDIRECT in the last 72 hours. Thyroid  Function Tests: No results for input(s): TSH, T4TOTAL, FREET4, T3FREE, THYROIDAB in the last 72 hours. Anemia Panel: No results for input(s): VITAMINB12, FOLATE, FERRITIN, TIBC, IRON , RETICCTPCT in the last 72 hours. Sepsis Labs: No results for input(s): PROCALCITON, LATICACIDVEN in the last 168 hours.  Recent Results (from the past 240 hours)  Respiratory (~20 pathogens) panel by PCR     Status: Abnormal   Collection Time: 04/05/24 12:35 AM   Specimen: Nasopharyngeal Swab; Respiratory  Result Value Ref Range Status   Adenovirus NOT DETECTED NOT DETECTED Final   Coronavirus 229E NOT DETECTED NOT DETECTED Final    Comment: (NOTE) The Coronavirus on the Respiratory Panel, DOES NOT test for the novel  Coronavirus (2019 nCoV)    Coronavirus HKU1 NOT DETECTED NOT  DETECTED Final   Coronavirus NL63 NOT DETECTED NOT DETECTED Final   Coronavirus OC43 NOT DETECTED NOT DETECTED Final   Metapneumovirus DETECTED (A) NOT DETECTED Final   Rhinovirus / Enterovirus NOT DETECTED NOT DETECTED Final   Influenza A NOT DETECTED NOT DETECTED Final   Influenza B NOT DETECTED NOT DETECTED Final   Parainfluenza Virus 1 NOT DETECTED NOT DETECTED Final   Parainfluenza Virus 2 NOT DETECTED NOT DETECTED Final   Parainfluenza Virus 3 NOT DETECTED NOT DETECTED Final   Parainfluenza Virus 4 NOT DETECTED NOT DETECTED Final   Respiratory Syncytial Virus NOT DETECTED NOT DETECTED Final   Bordetella pertussis NOT DETECTED NOT DETECTED Final   Bordetella Parapertussis NOT DETECTED NOT DETECTED Final   Chlamydophila pneumoniae NOT DETECTED NOT DETECTED Final   Mycoplasma pneumoniae NOT DETECTED NOT DETECTED Final    Comment: Performed at [facility], [address] [place], [place] [address]         Radiology Studies: No results found.       Scheduled Meds:  antiseptic  oral rinse  15 mL Topical BID   rOPINIRole   1 mg Oral TID   Continuous Infusions:  HYDROmorphone  2.5 mg/hr (April 15, 2024 0838)     LOS: 3 days     Calvin KATHEE Robson, MD [facility]   If 7PM-7AM, please contact night-coverage  April 15, 2024, 11:41 AM

## 2024-04-21 NOTE — Plan of Care (Signed)

## 2024-04-21 NOTE — Discharge Summary (Signed)
  DEATH SUMMARY   Patient Details  Name: [name] MRN: [id number] DOB: 03-29-1946  Admission/Discharge Information   Admit Date:  12-Apr-2024  Date of Death: Date of Death: 04/15/2024  Time of Death: Time of Death: 04/23/07  Length of Stay: 3  Code Status: DNR  Referring Physician: Alyse Bradley, MD    Reason(s) for Hospitalization  COPD exacerbation Acute on chronic hypoxic respiratory failure  Diagnoses  Preliminary cause of death:  Secondary Diagnoses (including complications and co-morbidities):  Principal Problem:   COPD exacerbation (HCC) Active Problems:   Acute on chronic respiratory failure with hypoxia (HCC)   Chronic diastolic CHF (congestive heart failure) (HCC)   HTN (hypertension)   Chronic kidney disease, stage 3b (HCC)   HLD (hyperlipidemia)   Hypercalcemia   Diarrhea   Anxiety and depression   Chronic back pain    Brief Hospital Course    78 y.o. female with medical history significant of COPD on 3.5 L of O2, HTN, HLD, depression, history of breast cancer 04-23-11 on anastrozole , history of colon cancer 2011/04/23, history of SDH, history of GI bleeding, chronic pain on buprenorphine  patch weekly, who presents with SOB.   Patient states that her shortness of breath has been progressively worsening for 1 week.  She has cough with little mucus production.  She has intermittent front chest pain, currently no active chest pain.  No fever or chills.  Patient does not have nausea, vomiting, abdominal pain.  She reports diarrhea, 3-4 times of diarrhea each day.  No symptoms of UTI. Per report, patient has chronic right lower leg ulcer. She was seen in [place] at the [facility] heart vascular clinic. Her lower legs are wrapped up today. She refused my examination of her lower legs.   Patient was seen in urgent care, found to have oxygen  desaturation to 73% on 4 L oxygen  (patient is 3.5 L oxygen  at baseline).  Patient was sent to ED for further evaluation and treatment.  At  arrival to ED, pt has severe respiratory distress, using accessory muscle for breathing, started 5 L oxygen  with 97% saturation, but then developed respiratory distress again, BiPAP is started.   11/15: Attempted twice to wean patient off BiPAP and on nasal cannula.  Unfortunately breathing became labored and patient was placed back on NIPPV.   11/16: Respiratory status remains quite tenuous.  Does respond well to BiPAP however use limited by underlying anxiety.   04-15-2024: Appreciate assistance from palliative care service.  Patient transition to full comfort measures.  Dilaudid  gtt. initiated.  Notified by bedside RN that patient passed away peacefully on 04-15-24.  Time of death 76.  Condolences provided to family at bedside.   Rylei Codispoti B Jorma Tassinari 04/08/2024, 12:31 PM

## 2024-04-21 DEATH — deceased
